# Patient Record
Sex: Female | Born: 1970
Health system: Southern US, Community
[De-identification: ages and names within clinical notes are randomized; demographics above are authoritative.]

## PROBLEM LIST (undated history)

## (undated) DIAGNOSIS — T4145XA Adverse effect of unspecified anesthetic, initial encounter: Secondary | ICD-10-CM

## (undated) DIAGNOSIS — T8859XA Other complications of anesthesia, initial encounter: Secondary | ICD-10-CM

## (undated) DIAGNOSIS — J439 Emphysema, unspecified: Secondary | ICD-10-CM

## (undated) DIAGNOSIS — F319 Bipolar disorder, unspecified: Secondary | ICD-10-CM

## (undated) DIAGNOSIS — G4730 Sleep apnea, unspecified: Secondary | ICD-10-CM

## (undated) DIAGNOSIS — G43909 Migraine, unspecified, not intractable, without status migrainosus: Secondary | ICD-10-CM

## (undated) DIAGNOSIS — F419 Anxiety disorder, unspecified: Secondary | ICD-10-CM

## (undated) DIAGNOSIS — Z973 Presence of spectacles and contact lenses: Secondary | ICD-10-CM

## (undated) DIAGNOSIS — K219 Gastro-esophageal reflux disease without esophagitis: Secondary | ICD-10-CM

## (undated) DIAGNOSIS — Z8489 Family history of other specified conditions: Secondary | ICD-10-CM

## (undated) DIAGNOSIS — J410 Simple chronic bronchitis: Secondary | ICD-10-CM

## (undated) DIAGNOSIS — F329 Major depressive disorder, single episode, unspecified: Secondary | ICD-10-CM

## (undated) DIAGNOSIS — N393 Stress incontinence (female) (male): Secondary | ICD-10-CM

## (undated) DIAGNOSIS — F32A Depression, unspecified: Secondary | ICD-10-CM

## (undated) DIAGNOSIS — T7840XA Allergy, unspecified, initial encounter: Secondary | ICD-10-CM

## (undated) HISTORY — DX: Gastro-esophageal reflux disease without esophagitis: K21.9

## (undated) HISTORY — PX: COLONOSCOPY: SHX174

## (undated) HISTORY — DX: Sleep apnea, unspecified: G47.30

## (undated) HISTORY — PX: ABDOMINAL HYSTERECTOMY: SHX81

## (undated) HISTORY — DX: Emphysema, unspecified: J43.9

## (undated) HISTORY — DX: Migraine, unspecified, not intractable, without status migrainosus: G43.909

## (undated) HISTORY — PX: EYE SURGERY: SHX253

## (undated) HISTORY — DX: Allergy, unspecified, initial encounter: T78.40XA

## (undated) HISTORY — DX: Bipolar disorder, unspecified: F31.9

## (undated) HISTORY — DX: Anxiety disorder, unspecified: F41.9

## (undated) HISTORY — PX: OTHER SURGICAL HISTORY: SHX169

## (undated) HISTORY — PX: CARPAL TUNNEL RELEASE: SHX101

## (undated) HISTORY — PX: GANGLION CYST EXCISION: SHX1691

## (undated) HISTORY — PX: LAPAROSCOPIC TOTAL HYSTERECTOMY: SUR800

## (undated) HISTORY — DX: Major depressive disorder, single episode, unspecified: F32.9

## (undated) HISTORY — DX: Depression, unspecified: F32.A

## (undated) HISTORY — PX: GREAT TOE ARTHRODESIS, JONES PROCEDURE: SUR57

## (undated) HISTORY — PX: UPPER GASTROINTESTINAL ENDOSCOPY: SHX188

---
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
## 1995-01-24 HISTORY — PX: CHOLECYSTECTOMY: SHX55

## 1995-01-24 HISTORY — PX: LAPAROSCOPIC CHOLECYSTECTOMY: SUR755

## 2005-06-05 ENCOUNTER — Encounter: Admission: RE | Admit: 2005-06-05 | Discharge: 2005-06-05 | Payer: Self-pay | Admitting: Internal Medicine

## 2005-06-05 ENCOUNTER — Encounter (INDEPENDENT_AMBULATORY_CARE_PROVIDER_SITE_OTHER): Payer: Self-pay | Admitting: *Deleted

## 2005-11-17 ENCOUNTER — Encounter: Admission: RE | Admit: 2005-11-17 | Discharge: 2005-11-17 | Payer: Self-pay | Admitting: Gastroenterology

## 2007-01-24 HISTORY — PX: CARPAL TUNNEL RELEASE: SHX101

## 2007-03-08 ENCOUNTER — Emergency Department (HOSPITAL_COMMUNITY): Admission: EM | Admit: 2007-03-08 | Discharge: 2007-03-08 | Payer: Self-pay | Admitting: Emergency Medicine

## 2007-09-12 ENCOUNTER — Encounter (INDEPENDENT_AMBULATORY_CARE_PROVIDER_SITE_OTHER): Payer: Self-pay | Admitting: Orthopedic Surgery

## 2007-09-12 ENCOUNTER — Ambulatory Visit (HOSPITAL_BASED_OUTPATIENT_CLINIC_OR_DEPARTMENT_OTHER): Admission: RE | Admit: 2007-09-12 | Discharge: 2007-09-12 | Payer: Self-pay | Admitting: Orthopedic Surgery

## 2010-06-07 NOTE — Op Note (Signed)
NAMETERRESA, Chapman               ACCOUNT NO.:  [id number]   MEDICAL RECORD NO.:  [id number]          PATIENT TYPE:  AMB   LOCATION:  [facility]                         FACILITY:  [facility]   PHYSICIAN:  Deidre Ala, M.D.    DATE OF BIRTH:  Dec 11, 1970   DATE OF PROCEDURE:  09/12/2007  DATE OF DISCHARGE:                               OPERATIVE REPORT   PREOPERATIVE DIAGNOSES:  1. Right carpal tunnel syndrome.  2. Right dorsal wrist ganglion cyst in classic position.   POSTOPERATIVE DIAGNOSES:  1. Right carpal tunnel syndrome.  2. Right dorsal wrist ganglion cyst in classic position.   PROCEDURE:  1. Right carpal tunnel release.  2. Excision dorsal wrist ganglion cyst to capsule.   SURGEON:  Doristine Section, M.D.   ASSISTANT:  Phineas Semen, P.A.   ANESTHESIA:  Local infiltration with 0.5% Marcaine without sedation due  to positive pregnancy test.   ESTIMATED BLOOD LOSS:  Minimal.   TOURNIQUET TIME:  Esmarch on forearm 45 minutes.   PATHOLOGIC FINDINGS AND HISTORY:  Lauren Chapman is a 40 year old female  sent for consultation by Dr. Patsy Lager for management of carpal tunnel  syndrome and a wrist cyst.  The patient has had signs and symptoms of  carpal tunnel syndrome and right dorsal wrist ganglion cyst.  She was  seen first in our office by Dr. Althea Charon on March 29, 2007.  At that time  she had a aspiration of the ganglion cyst and was placed in a splint.  This, however, did not produce long-lasting results with recurrence of  the cyst.  She had a possibility of getting EMG nerve conduction  studies, but because of the situation with her self pay status it was  elected to proceed on clinical grounds alone with positive Phalen's test  and Tinel's right wrist, for carpal tunnel and a tender 1 cm ganglion  cyst dorsally in the classic position at the scapholunate ligament.  At  this time she desired to proceed with surgical intervention.  Preoperatively today her pregnancy test came  back positive so she  elected to proceed but using local anesthetic only.  At surgery we used  0.5% Marcaine plain dorsally and volarly for a total of 30 mL.  We used  an upper forearm tourniquet with Esmarch and did not go to the  tourniquet in the proximal arm.  In any case we found classic findings  of hour glassing of the median nerve which was released well at the  carpal tunnel and we excised the dorsal ganglion cyst which had somewhat  decompressed down to a sessile base at the scapholunate ligament and we  excised the base.  We found blebs in the capsule at the base and at the  dorsal aspect of the ligament and cauterized them as per classic  protocol, excising an 8 x 8 mm area of capsule with the ganglion cyst.  It was sent for specimen.   PROCEDURE:  Adequate anesthesia obtained using local injection with 0.5%  Marcaine plain dorsal and volar.  We first started dorsally and the  ganglion cyst.  A  longitudinal incision was made under loupe  magnification.  Dissection was carried down to the wrist retinaculum  which was incised longitudinally.  We went in the interval between the  second and third dorsal compartment retracting the tendons, found the  ganglion, dissected it down to its sessile base, removed it with a block  of capsule and then cauterized the edges.  Irrigation was carried out  and the wound was closed with 3-0 Vicryl subcu and a running 4-0  Monocryl with Steri-Strips.  We then turned attention to the palmar side  where an incision was made in the basilar palm, thumb flexion crease  longitudinally.  Incision was deepened sharply with a knife and  hemostasis obtained using the Bovie electrocoagulator.  Dissection was  carried down to the palmar fascia.  We then placed a Freer elevator  underneath the palmar fascia to protect the nerve and cut down upon it  with a 64 Beaver blade.  Careful scissor dissection was then carried  out, releasing the nerve distally and  proximally, releasing the  epineurium and all branches were traced distally including the motor  branch.  We then released the nerve up well up into the distal forearm.  That wound was closed with interrupted and running 4-0 nylon.  Bulky  sterile compressive dressing was applied with volar plaster splint in  slight cock-up.  The patient having procedure well was awakened, taken  to recovery room in satisfactory condition to be discharged per  outpatient routine given Vicodin for pain and told to call the office  for appointment for recheck on Monday and to follow-up with her OB/GYN.           ______________________________  V. Charlesetta Shanks, M.D.     VEP/MEDQ  D:  09/12/2007  T:  09/12/2007  Job:  [id number]   cc:   Pearline Cables, MD  Fax: [phone]   Otho Darner, M.D.  Fax: [phone]

## 2010-09-07 ENCOUNTER — Other Ambulatory Visit: Payer: Self-pay | Admitting: Family Medicine

## 2010-09-07 ENCOUNTER — Other Ambulatory Visit: Payer: Self-pay | Admitting: Internal Medicine

## 2010-09-07 DIAGNOSIS — Z1231 Encounter for screening mammogram for malignant neoplasm of breast: Secondary | ICD-10-CM

## 2010-09-19 ENCOUNTER — Ambulatory Visit
Admission: RE | Admit: 2010-09-19 | Discharge: 2010-09-19 | Disposition: A | Discharge status: Home / Self Care | Payer: BC Managed Care – PPO | Source: Ambulatory Visit | Attending: Family Medicine | Admitting: Family Medicine

## 2010-09-19 DIAGNOSIS — Z1231 Encounter for screening mammogram for malignant neoplasm of breast: Secondary | ICD-10-CM

## 2010-09-30 ENCOUNTER — Other Ambulatory Visit: Payer: Self-pay | Admitting: Family Medicine

## 2010-09-30 DIAGNOSIS — R928 Other abnormal and inconclusive findings on diagnostic imaging of breast: Secondary | ICD-10-CM

## 2010-10-07 ENCOUNTER — Ambulatory Visit
Admission: RE | Admit: 2010-10-07 | Discharge: 2010-10-07 | Disposition: A | Discharge status: Home / Self Care | Payer: BC Managed Care – PPO | Source: Ambulatory Visit | Attending: Family Medicine | Admitting: Family Medicine

## 2010-10-07 DIAGNOSIS — R928 Other abnormal and inconclusive findings on diagnostic imaging of breast: Secondary | ICD-10-CM

## 2011-02-14 ENCOUNTER — Ambulatory Visit (INDEPENDENT_AMBULATORY_CARE_PROVIDER_SITE_OTHER): Payer: BC Managed Care – PPO

## 2011-02-14 DIAGNOSIS — R059 Cough, unspecified: Secondary | ICD-10-CM

## 2011-02-14 DIAGNOSIS — J9801 Acute bronchospasm: Secondary | ICD-10-CM

## 2011-02-14 DIAGNOSIS — R05 Cough: Secondary | ICD-10-CM

## 2011-09-23 ENCOUNTER — Ambulatory Visit: Payer: BC Managed Care – PPO

## 2011-10-11 ENCOUNTER — Ambulatory Visit (INDEPENDENT_AMBULATORY_CARE_PROVIDER_SITE_OTHER): Payer: BC Managed Care – PPO | Admitting: Physician Assistant

## 2011-10-11 VITALS — BP 118/76 | HR 78 | Temp 98.0°F | Resp 16 | Ht 63.25 in | Wt 172.6 lb

## 2011-10-11 DIAGNOSIS — Z309 Encounter for contraceptive management, unspecified: Secondary | ICD-10-CM

## 2011-10-11 DIAGNOSIS — J069 Acute upper respiratory infection, unspecified: Secondary | ICD-10-CM

## 2011-10-11 DIAGNOSIS — Z1211 Encounter for screening for malignant neoplasm of colon: Secondary | ICD-10-CM

## 2011-10-11 DIAGNOSIS — IMO0001 Reserved for inherently not codable concepts without codable children: Secondary | ICD-10-CM

## 2011-10-11 DIAGNOSIS — Z1239 Encounter for other screening for malignant neoplasm of breast: Secondary | ICD-10-CM

## 2011-10-11 DIAGNOSIS — Z23 Encounter for immunization: Secondary | ICD-10-CM

## 2011-10-11 DIAGNOSIS — Z113 Encounter for screening for infections with a predominantly sexual mode of transmission: Secondary | ICD-10-CM

## 2011-10-11 DIAGNOSIS — F419 Anxiety disorder, unspecified: Secondary | ICD-10-CM

## 2011-10-11 DIAGNOSIS — R061 Stridor: Secondary | ICD-10-CM

## 2011-10-11 DIAGNOSIS — R635 Abnormal weight gain: Secondary | ICD-10-CM

## 2011-10-11 DIAGNOSIS — Z0000 Encounter for general adult medical examination without abnormal findings: Secondary | ICD-10-CM

## 2011-10-11 DIAGNOSIS — K219 Gastro-esophageal reflux disease without esophagitis: Secondary | ICD-10-CM

## 2011-10-11 DIAGNOSIS — Z124 Encounter for screening for malignant neoplasm of cervix: Secondary | ICD-10-CM

## 2011-10-11 DIAGNOSIS — F319 Bipolar disorder, unspecified: Secondary | ICD-10-CM

## 2011-10-11 LAB — POCT CBC
Granulocyte percent: 66.1 %G (ref 37–80)
MCH, POC: 28.1 pg (ref 27–31.2)
MID (cbc): 0.6 (ref 0–0.9)
MPV: 8.3 fL (ref 0–99.8)
POC Granulocyte: 6.1 (ref 2–6.9)
POC MID %: 6.5 %M (ref 0–12)
Platelet Count, POC: 266 10*3/uL (ref 142–424)
RBC: 4.45 M/uL (ref 4.04–5.48)

## 2011-10-11 LAB — POCT URINALYSIS DIPSTICK
Glucose, UA: NEGATIVE
Spec Grav, UA: 1.01
Urobilinogen, UA: 0.2

## 2011-10-11 LAB — POCT UA - MICROSCOPIC ONLY
Bacteria, U Microscopic: NEGATIVE
Casts, Ur, LPF, POC: NEGATIVE

## 2011-10-11 LAB — POCT URINE PREGNANCY: Preg Test, Ur: NEGATIVE

## 2011-10-11 LAB — IFOBT (OCCULT BLOOD): IFOBT: NEGATIVE

## 2011-10-11 MED ORDER — IPRATROPIUM BROMIDE 0.03 % NA SOLN: 2.0000 | Freq: Two times a day (BID) | NASAL | Status: DC

## 2011-10-11 MED ORDER — MEDROXYPROGESTERONE ACETATE 150 MG/ML IM SUSP
150.0000 mg | Freq: Once | INTRAMUSCULAR | Status: AC
  Administered 2011-10-11: 150 mg via INTRAMUSCULAR

## 2011-10-11 MED ORDER — GUAIFENESIN ER 1200 MG PO TB12: 1.0000 | ORAL_TABLET | Freq: Two times a day (BID) | ORAL | Status: DC | PRN

## 2011-10-11 NOTE — Progress Notes (Signed)
Subjective:    Patient ID: Lauren Chapman, female    DOB: 10-28-1970, 41 y.o.   MRN: [id number]  HPI  Lauren Chapman is a 41 y.o. female who presents for Annual Wellness Exam with gyn exam. 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 Lat mammogram was a few years ago.  She was told she had a benign lump, that needs evaluation every two years.  Last pap was also a few years ago.  Remote history of abnormal pap, with biopsy, likely HPV, normal paps since then. G3 P2 Would like to start DepoProvera for contraception.  Previously on the pill.  Not currently using anything to prevent pregnancy.  Relatively new sexual partner.  Desires screening for STIs.  Allergies  Allergen Reactions  . Codeine Anaphylaxis   Prior to Admission medications   Medication Sig Start Date End Date Taking? Authorizing Provider  benztropine (COGENTIN) 1 MG tablet Take 1 mg by mouth 2 (two) times daily.   Yes Historical Provider, MD  busPIRone (BUSPAR) 15 MG tablet Take 15 mg by mouth 3 (three) times daily.   Yes Historical Provider, MD  gabapentin (NEURONTIN) 300 MG capsule Take 300 mg by mouth 3 (three) times daily.   Yes Historical Provider, MD  lamoTRIgine (LAMICTAL) 150 MG tablet Take 150 mg by mouth daily.   Yes Historical Provider, MD  omeprazole (PRILOSEC) 40 MG capsule Take 40 mg by mouth daily.   Yes Historical Provider, MD  risperiDONE (RISPERDAL) 2 MG tablet Take 2 mg by mouth 2 (two) times daily.   Yes Historical Provider, MD  sertraline (ZOLOFT) 100 MG tablet Take 100 mg by mouth daily.   Yes Historical Provider, MD    Past Medical History  Diagnosis Date  . Anxiety   . Depression   . Bipolar affective psychosis   . GERD (gastroesophageal reflux disease)    Past Surgical History  Procedure Date  . Cholecystectomy   . Carpal tunnel release   . Ganglion cyst excision     RIGHT wrist   History   Social History  . Marital Status: Divorced    Spouse Name: n/a    Number of Children: 2  . Years of Education: 18   Occupational History  . 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 Smoking status: Current Every Day Smoker -- 1.0 packs/day    Types: Cigarettes    Start date: 10/10/1981  . Smokeless tobacco: Never Used  . Alcohol Use: Yes     occasionally  . Drug Use: No  . Sexually Active: Yes -- Female partner(s)    Birth Control/ Protection: None   Other Topics Concern  . None   Social History Narrative  . None   Family History  Problem Relation Age of Onset  . Alcohol abuse Father   . Crohn's disease Sister   . Stroke Brother 77  . Migraines Daughter   . GER disease Daughter   . Migraines Daughter   . GER disease Daughter       Review of Systems  Constitutional: Positive for unexpected weight change (weight gain). Negative for fever, chills, diaphoresis, activity change, appetite change and fatigue.  HENT: Positive for congestion, rhinorrhea, sneezing and postnasal drip. Negative for hearing loss, ear pain, nosebleeds, sore throat, facial swelling, drooling, mouth sores, trouble swallowing, neck pain, neck stiffness, dental problem, voice change, sinus pressure, tinnitus and ear  discharge.   Eyes: Negative.   Respiratory: Positive for cough (non-productive) and chest tightness (with cold symptoms the past few days). Negative for apnea, choking, shortness of breath, wheezing and stridor.   Cardiovascular: Positive for leg swelling ("when I'm on my feet all day, I think it's because I'm fat.").  Gastrointestinal: Positive for rectal pain (after BM; also bright red blood on tissue when wiping; both x 7-12 months; "feels like there's something there, like a lump."). Negative for vomiting.  Genitourinary: Negative.   Musculoskeletal: Positive for arthralgias (x 3 years, when rises from sitting; has to "rest on my joints" briefly.) and gait problem (pain after rising  from sitting). Negative for myalgias, back pain and joint swelling.  Skin: Negative.   Neurological: Positive for headaches (migraine HA). Negative for dizziness, tremors, seizures, syncope, facial asymmetry, speech difficulty, weakness, light-headedness and numbness.  Hematological: Negative.   Psychiatric/Behavioral: Positive for hallucinations (auditory and visual; controlled on current treatment). Negative for behavioral problems, confusion, dysphoric mood, decreased concentration and agitation.     Tuberculosis Risk Questionnaire  1. Were you born outside the [place] in one of the following parts of the world:    [place], [place], [place], [place] or [place]?  No  2. Have you traveled outside the [place] and lived for more than one month in one of the following parts of the world:  [place], [place], [place], [place] or [place]?  Yes [place] as a teenager x 4 years  3. Do you have a compromised immune system such as from any of the following conditions:  HIV/AIDS, organ or bone marrow transplantation, diabetes, immunosuppressive medicines (e.g. Prednisone, Remicaide), leukemia, lymphoma, cancer of the head or neck, gastrectomy or jejunal bypass, end-stage renal disease (on dialysis), or silicosis?  No    4. Have you ever done one of the following:    Used crack cocaine, injected illegal drugs, worked or resided in jail or prison, worked or resided at a homeless shelter, or worked as a Research scientist (physical sciences) in direct contact with patients?  No  5. Have you ever been exposed to anyone with infectious tuberculosis?  No  Tuberculosis Symptom Questionnaire  Do you currently have any of the following symptoms?  1. Unexplained cough lasting more than 3 weeks? No  Unexplained fever lasting more than 3 weeks. No   3. Night Sweats (sweating that leaves the bedclothes and sheets wet)   No  4. Shortness of Breath No  5. Chest Pain No  6. Unintentional weight  loss  No  7. Unexplained fatigue (very tired for no reason) No     Objective:   Physical Exam  Vitals reviewed. Constitutional: She is oriented to person, place, and time. Vital signs are normal. She appears well-developed and well-nourished. She is active and cooperative. No distress.  HENT:  Head: Normocephalic and atraumatic.  Right Ear: Hearing, tympanic membrane, external ear and ear canal normal. No foreign bodies.  Left Ear: Hearing, tympanic membrane, external ear and ear canal normal. No foreign bodies.  Nose: Nose normal.  Mouth/Throat: Uvula is midline, oropharynx is clear and moist and mucous membranes are normal. No oral lesions. Normal dentition. No dental abscesses or uvula swelling. No oropharyngeal exudate.       S/p dental rebuild  Eyes: Conjunctivae normal, EOM and lids are normal. Pupils are equal, round, and reactive to light. Right eye exhibits no discharge. Left eye exhibits no discharge. No scleral icterus.  Fundoscopic exam:  The right eye shows no arteriolar narrowing, no AV nicking, no exudate, no hemorrhage and no papilledema. The right eye shows red reflex.The right eye shows no venous pulsations.      The left eye shows no arteriolar narrowing, no AV nicking, no exudate, no hemorrhage and no papilledema. The left eye shows red reflex.The left eye shows no venous pulsations. Neck: Trachea normal, normal range of motion and full passive range of motion without pain. Neck supple. No spinous process tenderness and no muscular tenderness present. No mass and no thyromegaly present.  Cardiovascular: Normal rate, regular rhythm, normal heart sounds, intact distal pulses and normal pulses.   Pulses:      Radial pulses are 2+ on the right side, and 2+ on the left side.       Dorsalis pedis pulses are 2+ on the right side, and 2+ on the left side.  Pulmonary/Chest: Effort normal and breath sounds normal. She exhibits no tenderness and no retraction. Right breast  exhibits no inverted nipple, no mass, no nipple discharge, no skin change and no tenderness. Left breast exhibits no inverted nipple, no mass, no nipple discharge, no skin change and no tenderness. Breasts are symmetrical.  Abdominal: Soft. Normal appearance and bowel sounds are normal. She exhibits no distension and no mass. There is no hepatosplenomegaly. There is no tenderness. There is no rigidity, no rebound, no guarding, no CVA tenderness, no tenderness at McBurney's point and negative Murphy's sign. No hernia. Hernia confirmed negative in the right inguinal area and confirmed negative in the left inguinal area.  Genitourinary: Rectum normal, vagina normal and uterus normal. Rectal exam shows no external hemorrhoid and no fissure. Internal hemorrhoid: not palpable, but suspected, given her symptoms. No breast swelling, tenderness, discharge or bleeding. Pelvic exam was performed with patient supine. No labial fusion. There is no rash, tenderness, lesion or injury on the right labia. There is no rash, tenderness, lesion or injury on the left labia. Cervix exhibits no motion tenderness, no discharge and no friability. Right adnexum displays no mass, no tenderness and no fullness. Left adnexum displays no mass, no tenderness and no fullness. No erythema, tenderness or bleeding around the vagina. No foreign body around the vagina. No signs of injury around the vagina. No vaginal discharge found.  Musculoskeletal: She exhibits no edema and no tenderness.       Right knee: Normal.       Left knee: Normal.       Right ankle: Normal.       Left ankle: Normal.       Cervical back: Normal.       Thoracic back: Normal.       Lumbar back: Normal.  Lymphadenopathy:       Head (right side): No tonsillar, no preauricular, no posterior auricular and no occipital adenopathy present.       Head (left side): No tonsillar, no preauricular, no posterior auricular and no occipital adenopathy present.    She has no  cervical adenopathy.    She has no axillary adenopathy.       Right: No inguinal and no supraclavicular adenopathy present.       Left: No inguinal and no supraclavicular adenopathy present.  Neurological: She is alert and oriented to person, place, and time. She has normal strength and normal reflexes. No cranial nerve deficit or sensory deficit. She exhibits normal muscle tone. Coordination and gait normal.  Skin: Skin is warm, dry and intact. No rash noted. She  is not diaphoretic. No cyanosis or erythema. Nails show no clubbing.  Psychiatric: She has a normal mood and affect. Her speech is normal and behavior is normal. Cognition and memory are normal. She does not express impulsivity or inappropriate judgment.   Results for orders placed in visit on 10/11/11  POCT CBC      Component Value Range   WBC 9.3  4.6 - 10.2 K/uL   Lymph, poc 2.5  0.6 - 3.4   POC LYMPH PERCENT 27.4  10 - 50 %L   MID (cbc) 0.6  0 - 0.9   POC MID % 6.5  0 - 12 %M   POC Granulocyte 6.1  2 - 6.9   Granulocyte percent 66.1  37 - 80 %G   RBC 4.45  4.04 - 5.48 M/uL   Hemoglobin 12.5  12.2 - 16.2 g/dL   HCT, POC 16.1  09.6 - 47.9 %   MCV 90.8  80 - 97 fL   MCH, POC 28.1  27 - 31.2 pg   MCHC 30.9 (*) 31.8 - 35.4 g/dL   RDW, POC 04.5     Platelet Count, POC 266  142 - 424 K/uL   MPV 8.3  0 - 99.8 fL  POCT UA - MICROSCOPIC ONLY      Component Value Range   WBC, Ur, HPF, POC 0-2     RBC, urine, microscopic 0-1     Bacteria, U Microscopic neg     Mucus, UA neg     Epithelial cells, urine per micros 0-3     Crystals, Ur, HPF, POC neg     Casts, Ur, LPF, POC neg     Yeast, UA neg    POCT URINALYSIS DIPSTICK      Component Value Range   Color, UA yellow     Clarity, UA clear     Glucose, UA neg     Bilirubin, UA neg     Ketones, UA neg     Spec Grav, UA 1.010     Blood, UA small     pH, UA 5.0     Protein, UA neg     Urobilinogen, UA 0.2     Nitrite, UA neg     Leukocytes, UA Trace    POCT URINE  PREGNANCY      Component Value Range   Preg Test, Ur Negative    IFOBT (OCCULT BLOOD)      Component Value Range   IFOBT Negative        Assessment & Plan:   1. Routine general medical examination at a health care facility  POCT CBC, POCT UA - Microscopic Only, POCT urinalysis dipstick, Comprehensive metabolic panel, Lipid panel  2. Contraception  POCT urine pregnancy, medroxyPROGESTERone (DEPO-PROVERA) injection 150 mg; return in 3 months for next dose.  Back-up method advised x 1 week.  3. Screening examination for venereal disease  Hepatitis B surface antibody, Hepatitis B surface antigen, Hepatitis C antibody, RPR, Pap IG, CT/NG NAA, and HPV (high risk); encouraged condom use.  4. Acute upper respiratory infections of unspecified site  ipratropium (ATROVENT) 0.03 % nasal spray, Guaifenesin (MUCINEX MAXIMUM STRENGTH) 1200 MG TB12; supportive care advised.  5. Screening for cervical cancer  Pap IG, CT/NG NAA, and HPV (high risk)  6. Need for influenza vaccination  Flu vaccine greater than or equal to 3yo preservative free IM  7. Weight gain  TSH; exercise, healthy eating.  8. Screening for colon cancer  IFOBT POC (  occult bld, rslt in office)  9. Screening for breast cancer  MM Digital Screening  10. Joint pain      Await labs; consider additional evaluation with ESR, ANA, Rheumatoid profile; Xrays. 11. Probable Internal Hemorrhoid   OTC hemorrhoidal product.  RTC if symptoms persist. 12. Screening for TB     Screening performed.  No need for testing.

## 2011-10-11 NOTE — Patient Instructions (Addendum)
Keeping You Healthy  Get These Tests 1. Blood Pressure- Have your blood pressure checked once a year by your health care provider.  Normal blood pressure is 120/80. 2. Weight- Have your body mass index (BMI) calculated to screen for obesity.  BMI is measure of body fat based on height and weight.  You can also calculate your own BMI at https://www.west-esparza.com/. 3. Cholesterol- Have your cholesterol checked every 5 years starting at age 43 then yearly starting at age 14. 4. Chlamydia, HIV, and other sexually transmitted diseases- Get screened every year until age 67, then within three months of each new sexual provider. 5. Pap Smear- Every 1-3 years; discuss with your health care provider. 6. Mammogram- Every year starting at age 32  Take these medicines  Calcium with Vitamin D-Your body needs 1200 mg of Calcium each day and [phone] IU of Vitamin D daily.  Your body can only absorb 500 mg of Calcium at a time so Calcium must be taken in 2 or 3 divided doses throughout the day.  Multivitamin with folic acid- Once daily if it is possible for you to become pregnant.  Get these Immunizations  Gardasil-Series of three doses; prevents HPV related illness such as genital warts and cervical cancer.  Menactra-Single dose; prevents meningitis.  Tetanus shot- Every 10 years.  Flu shot-Every year.  Take these steps 1. Do not smoke-Your healthcare provider can help you quit.  For tips on how to quit go to www.smokefree.gov or call 1-800 QUITNOW. 2. Be physically active- Exercise 5 days a week for at least 30 minutes.  If you are not already physically active, start slow and gradually work up to 30 minutes of moderate physical activity.  Examples of moderate activity include walking briskly, dancing, swimming, bicycling, etc. 3. Breast Cancer- A self breast exam every month is important for early detection of breast cancer.  For more information and instruction on self breast exams, ask your  healthcare provider or [email or web address].[email or web address]. 4. Eat a healthy diet- Eat a variety of healthy foods such as fruits, vegetables, whole grains, low fat milk, low fat cheeses, yogurt, lean meats, poultry and fish, beans, nuts, tofu, etc.  For more information go to www. Thenutritionsource.org 5. Drink alcohol in moderation- Limit alcohol intake to one drink or less per day. Never drink and drive. 6. Depression- Your emotional health is as important as your physical health.  If you're feeling down or losing interest in things you normally enjoy please talk to your healthcare provider about being screened for depression. 7. Dental visit- Brush and floss your teeth twice daily; visit your dentist twice a year. 8. Eye doctor- Get an eye exam at least every 2 years. 9. Helmet use- Always wear a helmet when riding a bicycle, motorcycle, rollerblading or skateboarding. 10. Safe sex- If you may be exposed to sexually transmitted infections, use a condom. 11. Seat belts- Seat belts can save your live; always wear one. 12. Smoke/Carbon Monoxide detectors- These detectors need to be installed on the appropriate level of your home. Replace batteries at least once a year. 13. Skin cancer- When out in the sun please cover up and use sunscreen 15 SPF or higher. 14. Violence- If anyone is threatening or hurting you, please tell your healthcare provider.  For your cold, Get plenty of rest and drink at least 64 ounces of water daily.  If your symptoms worsen or persist, please return for additional evaluation. If the pain and bleeding from your rectum persist, even with  the use of an OTC hemorrhoidal cream, please return.

## 2011-10-12 DIAGNOSIS — F319 Bipolar disorder, unspecified: Secondary | ICD-10-CM | POA: Insufficient documentation

## 2011-10-12 DIAGNOSIS — F329 Major depressive disorder, single episode, unspecified: Secondary | ICD-10-CM | POA: Insufficient documentation

## 2011-10-12 DIAGNOSIS — F419 Anxiety disorder, unspecified: Secondary | ICD-10-CM | POA: Insufficient documentation

## 2011-10-12 DIAGNOSIS — K219 Gastro-esophageal reflux disease without esophagitis: Secondary | ICD-10-CM | POA: Insufficient documentation

## 2011-10-12 LAB — COMPREHENSIVE METABOLIC PANEL
ALT: 14 U/L (ref 0–35)
AST: 15 U/L (ref 0–37)
Alkaline Phosphatase: 68 U/L (ref 39–117)
Calcium: 9.5 mg/dL (ref 8.4–10.5)
Chloride: 100 mEq/L (ref 96–112)
Creat: 1.03 mg/dL (ref 0.50–1.10)
Total Bilirubin: 0.3 mg/dL (ref 0.3–1.2)

## 2011-10-12 LAB — LIPID PANEL
LDL Cholesterol: 103 mg/dL — ABNORMAL HIGH (ref 0–99)
Total CHOL/HDL Ratio: 3.6 Ratio
VLDL: 26 mg/dL (ref 0–40)

## 2011-10-13 LAB — HEPATITIS B SURFACE ANTIGEN: Hepatitis B Surface Ag: NEGATIVE

## 2011-10-13 LAB — HEPATITIS B SURFACE ANTIBODY, QUANTITATIVE: Hepatitis B-Post: 0.8 m[IU]/mL

## 2011-10-16 LAB — PAP IG, CT-NG NAA, HPV HIGH-RISK
Chlamydia Probe Amp: NEGATIVE
GC Probe Amp: NEGATIVE
HPV DNA High Risk: NOT DETECTED

## 2011-10-17 ENCOUNTER — Encounter: Payer: Self-pay | Admitting: Physician Assistant

## 2011-11-08 LAB — HM MAMMOGRAPHY: HM Mammogram: NEGATIVE

## 2011-11-10 ENCOUNTER — Encounter: Payer: Self-pay | Admitting: Physician Assistant

## 2012-01-08 ENCOUNTER — Ambulatory Visit: Payer: Self-pay | Admitting: Physician Assistant

## 2012-01-08 DIAGNOSIS — Z309 Encounter for contraceptive management, unspecified: Secondary | ICD-10-CM

## 2012-01-08 MED ORDER — MEDROXYPROGESTERONE ACETATE 150 MG/ML IM SUSP
150.0000 mg | Freq: Once | INTRAMUSCULAR | Status: AC
  Administered 2012-01-08: 150 mg via INTRAMUSCULAR

## 2012-01-08 NOTE — Progress Notes (Signed)
   [address] [place] [address]   Phone [phone]   Pt here for her Depo-provera injection.  She is within her window.  Not having any problems.

## 2012-03-23 DIAGNOSIS — Z0271 Encounter for disability determination: Secondary | ICD-10-CM

## 2012-05-28 ENCOUNTER — Other Ambulatory Visit: Payer: Self-pay | Admitting: Internal Medicine

## 2013-06-30 LAB — HM MAMMOGRAPHY

## 2013-07-07 ENCOUNTER — Encounter: Payer: Self-pay | Admitting: Physician Assistant

## 2014-07-25 DIAGNOSIS — G4489 Other headache syndrome: Secondary | ICD-10-CM | POA: Diagnosis not present

## 2014-07-25 DIAGNOSIS — R112 Nausea with vomiting, unspecified: Secondary | ICD-10-CM | POA: Diagnosis not present

## 2014-08-10 DIAGNOSIS — Z13220 Encounter for screening for lipoid disorders: Secondary | ICD-10-CM | POA: Diagnosis not present

## 2014-09-02 ENCOUNTER — Encounter: Payer: Self-pay | Admitting: Obstetrics & Gynecology

## 2014-09-02 ENCOUNTER — Ambulatory Visit (INDEPENDENT_AMBULATORY_CARE_PROVIDER_SITE_OTHER): Payer: Medicare Other | Admitting: Obstetrics & Gynecology

## 2014-09-02 VITALS — BP 90/64 | HR 83 | Resp 18 | Ht 63.0 in | Wt 177.0 lb

## 2014-09-02 DIAGNOSIS — Z0000 Encounter for general adult medical examination without abnormal findings: Secondary | ICD-10-CM

## 2014-09-02 DIAGNOSIS — Z30011 Encounter for initial prescription of contraceptive pills: Secondary | ICD-10-CM

## 2014-09-02 DIAGNOSIS — Z3009 Encounter for other general counseling and advice on contraception: Secondary | ICD-10-CM | POA: Insufficient documentation

## 2014-09-02 MED ORDER — NORETHIN ACE-ETH ESTRAD-FE 1-20 MG-MCG(24) PO TABS: 1.0000 | ORAL_TABLET | Freq: Every day | ORAL | Status: DC

## 2014-09-02 NOTE — Patient Instructions (Addendum)
Intrauterine Device Information An intrauterine device (IUD) is inserted into your uterus to prevent pregnancy. There are two types of IUDs available:   Copper IUD--This type of IUD is wrapped in copper wire and is placed inside the uterus. Copper makes the uterus and fallopian tubes produce a fluid that kills sperm. The copper IUD can stay in place for 10 years.  Hormone IUD--This type of IUD contains the hormone progestin (synthetic progesterone). The hormone thickens the cervical mucus and prevents sperm from entering the uterus. It also thins the uterine lining to prevent implantation of a fertilized egg. The hormone can weaken or kill the sperm that get into the uterus. One type of hormone IUD can stay in place for 5 years, and another type can stay in place for 3 years. Your health care provider will make sure you are a good candidate for a contraceptive IUD. Discuss with your health care provider the possible side effects.  ADVANTAGES OF AN INTRAUTERINE DEVICE  IUDs are highly effective, reversible, long acting, and low maintenance.   There are no estrogen-related side effects.   An IUD can be used when breastfeeding.   IUDs are not associated with weight gain.   The copper IUD works immediately after insertion.   The hormone IUD works right away if inserted within 7 days of your period starting. You will need to use a backup method of birth control for 7 days if the hormone IUD is inserted at any other time in your cycle.  The copper IUD does not interfere with your female hormones.   The hormone IUD can make heavy menstrual periods lighter and decrease cramping.   The hormone IUD can be used for 3 or 5 years.   The copper IUD can be used for 10 years. DISADVANTAGES OF AN INTRAUTERINE DEVICE  The hormone IUD can be associated with irregular bleeding patterns.   The copper IUD can make your menstrual flow heavier and more painful.   You may experience cramping and  vaginal bleeding after insertion.  Document Released: 12/14/2003 Document Revised: 09/11/2012 Document Reviewed: 06/30/2012 [facility] Patient Information 2015 [place], [place]. This information is not intended to replace advice given to you by your health care provider. Make sure you discuss any questions you have with your health care provider.   Contraception Choices Contraception (birth control) is the use of any methods or devices to prevent pregnancy. Below are some methods to help avoid pregnancy. HORMONAL METHODS   Contraceptive implant. This is a thin, plastic tube containing progesterone hormone. It does not contain estrogen hormone. Your health care provider inserts the tube in the inner part of the upper arm. The tube can remain in place for up to 3 years. After 3 years, the implant must be removed. The implant prevents the ovaries from releasing an egg (ovulation), thickens the cervical mucus to prevent sperm from entering the uterus, and thins the lining of the inside of the uterus.  Progesterone-only injections. These injections are given every 3 months by your health care provider to prevent pregnancy. This synthetic progesterone hormone stops the ovaries from releasing eggs. It also thickens cervical mucus and changes the uterine lining. This makes it harder for sperm to survive in the uterus.  Birth control pills. These pills contain estrogen and progesterone hormone. They work by preventing the ovaries from releasing eggs (ovulation). They also cause the cervical mucus to thicken, preventing the sperm from entering the uterus. Birth control pills are prescribed by a health care  provider.Birth control pills can also be used to treat heavy periods.  Minipill. This type of birth control pill contains only the progesterone hormone. They are taken every day of each month and must be prescribed by your health care provider.  Birth control patch. The patch contains hormones similar to  those in birth control pills. It must be changed once a week and is prescribed by a health care provider.  Vaginal ring. The ring contains hormones similar to those in birth control pills. It is left in the vagina for 3 weeks, removed for 1 week, and then a new one is put back in place. The patient must be comfortable inserting and removing the ring from the vagina.A health care provider's prescription is necessary.  Emergency contraception. Emergency contraceptives prevent pregnancy after unprotected sexual intercourse. This pill can be taken right after sex or up to 5 days after unprotected sex. It is most effective the sooner you take the pills after having sexual intercourse. Most emergency contraceptive pills are available without a prescription. Check with your pharmacist. Do not use emergency contraception as your only form of birth control. BARRIER METHODS   Female condom. This is a thin sheath (latex or rubber) that is worn over the penis during sexual intercourse. It can be used with spermicide to increase effectiveness.  Female condom. This is a soft, loose-fitting sheath that is put into the vagina before sexual intercourse.  Diaphragm. This is a soft, latex, dome-shaped barrier that must be fitted by a health care provider. It is inserted into the vagina, along with a spermicidal jelly. It is inserted before intercourse. The diaphragm should be left in the vagina for 6 to 8 hours after intercourse.  Cervical cap. This is a round, soft, latex or plastic cup that fits over the cervix and must be fitted by a health care provider. The cap can be left in place for up to 48 hours after intercourse.  Sponge. This is a soft, circular piece of polyurethane foam. The sponge has spermicide in it. It is inserted into the vagina after wetting it and before sexual intercourse.  Spermicides. These are chemicals that kill or block sperm from entering the cervix and uterus. They come in the form of  creams, jellies, suppositories, foam, or tablets. They do not require a prescription. They are inserted into the vagina with an applicator before having sexual intercourse. The process must be repeated every time you have sexual intercourse. INTRAUTERINE CONTRACEPTION  Intrauterine device (IUD). This is a T-shaped device that is put in a woman's uterus during a menstrual period to prevent pregnancy. There are 2 types:  Copper IUD. This type of IUD is wrapped in copper wire and is placed inside the uterus. Copper makes the uterus and fallopian tubes produce a fluid that kills sperm. It can stay in place for 10 years.  Hormone IUD. This type of IUD contains the hormone progestin (synthetic progesterone). The hormone thickens the cervical mucus and prevents sperm from entering the uterus, and it also thins the uterine lining to prevent implantation of a fertilized egg. The hormone can weaken or kill the sperm that get into the uterus. It can stay in place for 3-5 years, depending on which type of IUD is used. PERMANENT METHODS OF CONTRACEPTION  Female tubal ligation. This is when the woman's fallopian tubes are surgically sealed, tied, or blocked to prevent the egg from traveling to the uterus.  Hysteroscopic sterilization. This involves placing a small coil or insert  into each fallopian tube. Your doctor uses a technique called hysteroscopy to do the procedure. The device causes scar tissue to form. This results in permanent blockage of the fallopian tubes, so the sperm cannot fertilize the egg. It takes about 3 months after the procedure for the tubes to become blocked. You must use another form of birth control for these 3 months.  Female sterilization. This is when the female has the tubes that carry sperm tied off (vasectomy).This blocks sperm from entering the vagina during sexual intercourse. After the procedure, the man can still ejaculate fluid (semen). NATURAL PLANNING METHODS  Natural family  planning. This is not having sexual intercourse or using a barrier method (condom, diaphragm, cervical cap) on days the woman could become pregnant.  Calendar method. This is keeping track of the length of each menstrual cycle and identifying when you are fertile.  Ovulation method. This is avoiding sexual intercourse during ovulation.  Symptothermal method. This is avoiding sexual intercourse during ovulation, using a thermometer and ovulation symptoms.  Post-ovulation method. This is timing sexual intercourse after you have ovulated. Regardless of which type or method of contraception you choose, it is important that you use condoms to protect against the transmission of sexually transmitted infections (STIs). Talk with your health care provider about which form of contraception is most appropriate for you. Document Released: 01/09/2005 Document Revised: 01/14/2013 Document Reviewed: 07/04/2012 [facility] Patient Information 2015 [place], [place]. This information is not intended to replace advice given to you by your health care provider. Make sure you discuss any questions you have with your health care provider.   Laparoscopic Tubal Ligation Laparoscopic tubal ligation is a procedure that closes the fallopian tubes at a time other than right after childbirth. By closing the fallopian tubes, the eggs that are released from the ovaries cannot enter the uterus and sperm cannot reach the egg. Tubal ligation is also known as getting your "tubes tied." Tubal ligation is done so you will not be able to get pregnant or have a baby.  Although this procedure may be reversed, it should be considered permanent and irreversible. If you want to have future pregnancies, you should not have this procedure.  LET YOUR CAREGIVER KNOW ABOUT:  Allergies to food or medicine.  Medicines taken, including vitamins, herbs, eyedrops, over-the-counter medicines, and creams.  Use of steroids (by mouth or  creams).  Previous problems with numbing medicines.  History of bleeding problems or blood clots.  Any recent colds or infections.  Previous surgery.  Other health problems, including diabetes and kidney problems.  Possibility of pregnancy, if this applies.  Any past pregnancies. RISKS AND COMPLICATIONS   Infection.  Bleeding.  Injury to surrounding organs.  Anesthetic side effects.  Failure of the procedure.  Ectopic pregnancy.  Future regret about having the procedure done. BEFORE THE PROCEDURE  Do not take aspirin or blood thinners a week before the procedure or as directed. This can cause bleeding.  Do not eat or drink anything 6 to 8 hours before the procedure. PROCEDURE   You may be given a medicine to help you relax (sedative) before the procedure. You will be given a medicine to make you sleep (general anesthetic) during the procedure.  A tube will be put down your throat to help your breath while under general anesthesia.  Two small cuts (incisions) are made in the lower abdominal area and near the belly button.  Your abdominal area will be inflated with a safe gas (carbon dioxide).  This helps give the surgeon room to operate, visualize, and helps the surgeon avoid other organs.  A thin, lighted tube (laparoscope) with a camera attached is inserted into your abdomen through one of the incisions near the belly button. Other small instruments are also inserted through the other abdominal incision.  The fallopian tubes are located and are either blocked with a ring, clip, or are burned (cauterized).  After the fallopian tubes are blocked, the gas is released from the abdomen.  The incisions will be closed with stitches (sutures), and a bandage may be placed over the incisions. AFTER THE PROCEDURE   You will rest in a recovery room for 1--4 hours until you are stable and doing well.  You will also have some mild abdominal discomfort for 3--7 days. You will  be given pain medicine to ease any discomfort.  As long as there are no problems, you may be allowed to go home. Someone will need to drive you home and be with you for at least 24 hours once home.  You may have some mild discomfort in the throat. This is from the tube placed in your throat while you were sleeping.  You may experience discomfort in the shoulder area from some trapped air between the liver and diaphragm. This sensation is normal and will slowly go away on its own. Document Released: 04/17/2000 Document Revised: 07/11/2011 Document Reviewed: 04/22/2011 [facility] Patient Information 2015 [place], [place]. This information is not intended to replace advice given to you by your health care provider. Make sure you discuss any questions you have with your health care provider.   Sterilization Information, Female Female sterilization is a procedure to permanently prevent pregnancy. There are different ways to perform sterilization, but all either block or close the fallopian tubes so that your eggs cannot reach your uterus. If your egg cannot reach your uterus, sperm cannot fertilize the egg, and you cannot get pregnant.  Sterilization is performed by a surgical procedure. Sometimes these procedures are performed in a hospital while a patient is asleep. Sometimes they can be done in a clinic setting with the patient awake. The fallopian tubes can be surgically cut, tied, or sealed through a procedure called tubal ligation. The fallopian tubes can also be closed with clips or rings. Sterilization can also be done by placing a tiny coil into each fallopian tube, which causes scar tissue to grow inside the tube. The scar tissue then blocks the tubes.  Discuss sterilization with your caregiver to answer any concerns you or your partner may have. You may want to ask what type of sterilization your caregiver performs. Some caregivers may not perform all the various options. Sterilization is permanent  and should only be done if you are sure you do not want children or do not want any more children. Having a sterilization reversed may not be successful.  STERILIZATION PROCEDURES  Laparoscopic sterilization. This is a surgical method performed at a time other than right after childbirth. Two incisions are made in the lower abdomen. A thin, lighted tube (laparoscope) is inserted into one of the incisions and is used to perform the procedure. The fallopian tubes are closed with a ring or a clip. An instrument that uses heat could be used to seal the tubes closed (electrocautery).   Mini-laparotomy. This is a surgical method done 1 or 2 days after giving birth. Typically, a small incision is made just below the belly button (umbilicus) and the fallopian tubes are exposed. The tubes  can then be sealed, tied, or cut.   Hysteroscopic sterilization. This is performed at a time other than right after childbirth. A tiny, spring-like coil is inserted through the cervix and uterus and placed into the fallopian tubes. The coil causes scaring and blocks the tubes. Other forms of contraception should be used for 3 months after the procedure to allow the scar tissue to form completely. Additionally, it is required hysterosalpingography be done 3 months later to ensure that the procedure was successful. Hysterosalpingography is a procedure that uses X-rays to look at your uterus and fallopian tubes after a material to make them show up better has been inserted. IS STERILIZATION SAFE? Sterilization is considered safe with very rare complications. Risks depend on the type of procedure you have. As with any surgical procedure, there are risks. Some risks of sterilization by any means include:   Bleeding.  Infection.  Reaction to anesthesia medicine.  Injury to surrounding organs. Risks specific to having hysteroscopic coils placed include:  The coils may not be placed correctly the first time.   The coils  may move out of place.   The tubes may not get completely blocked after 3 months.   Injury to surrounding organs when placing the coil.  HOW EFFECTIVE IS FEMALE STERILIZATION? Sterilization is nearly 100% effective, but it can fail. Depending on the type of sterilization, the rate of failure can be as high as 3%. After hysteroscopic sterilization with placement of fallopian tube coils, you will need back-up birth control for 3 months after the procedure. Sterilization is effective for a lifetime.  BENEFITS OF STERILIZATION  It does not affect your hormones, and therefore will not affect your menstrual periods, sexual desire, or performance.   It is effective for a lifetime.   It is safe.   You do not need to worry about getting pregnant. Keep in mind that if you had the hysteroscopic placement procedure, you must wait 3 months after the procedure (or until your caregiver confirms) before pregnancy is not considered possible.   There are no side effects unlike other types of birth control (contraception).  DRAWBACKS OF STERILIZATION  You must be sure you do not want children or any more children. The procedure is permanent.   It does not provide protection against sexually transmitted infections (STIs).   The tubes can grow back together. If this happens, there is a risk of pregnancy. There is also an increased risk (50%) of pregnancy being an ectopic pregnancy. This is a pregnancy that happens outside of the uterus. Document Released: 06/28/2007 Document Revised: 01/14/2013 Document Reviewed: 04/27/2011 [facility] Patient Information 2015 [place], [place]. This information is not intended to replace advice given to you by your health care provider. Make sure you discuss any questions you have with your health care provider.

## 2014-09-02 NOTE — Progress Notes (Signed)
CLINIC ENCOUNTER NOTE  History:  44 y.o. [id number] here today for tubal ligation consult; referred from [facility].  Last pregnancy was about 5 years ago and ended as a SAB needing D&C.  Has been using condoms but feels she needs something else.  Doe snot feel she wants sterilization anymore, wants to discuss other options.  She denies any abnormal vaginal discharge, bleeding, pelvic pain or other concerns.   Past Medical History  Diagnosis Date  . Anxiety   . Depression   . Bipolar affective psychosis   . GERD (gastroesophageal reflux disease)   . Migraine     Past Surgical History  Procedure Laterality Date  . Cholecystectomy    . Carpal tunnel release    . Ganglion cyst excision      RIGHT wrist   OB History  Gravida Para Term Preterm AB SAB TAB Ectopic Multiple Living  3 2 2  0 1 1 0 0 0 2    # Outcome Date GA Lbr Len/2nd Weight Sex Delivery Anes PTL Lv  3 Term           2 Term           1 SAB               The following portions of the patient's history were reviewed and updated as appropriate: allergies, current medications, past family history, past medical history, past social history, past surgical history and problem list.   Health Maintenance:  Normal pap recently.  Normal mammogram in 06/2013.  Review of Systems:  Pertinent items are noted in HPI. Comprehensive review of systems was otherwise negative.  Objective:  Physical Exam BP 90/64 mmHg  Pulse 83  Resp 18  Ht 5\' 3"  (1.6 m)  Wt 177 lb (80.287 kg)  BMI 31.36 kg/m2  LMP 06/07/2014 (Exact Date) CONSTITUTIONAL: Well-developed, well-nourished female in no acute distress.  HENT:  Normocephalic, atraumatic. External right and left ear normal. Oropharynx is clear and moist EYES: Conjunctivae and EOM are normal. Pupils are equal, round, and reactive to light. No scleral icterus.  NECK: Normal range of motion, supple, no masses SKIN: Skin is warm and dry. No rash noted. Not diaphoretic. No erythema. No  pallor. [place]: Alert and oriented to person, place, and time. Normal reflexes, muscle tone coordination. No cranial nerve deficit noted. PSYCHIATRIC: Normal mood and affect. Normal behavior. Normal judgment and thought content. CARDIOVASCULAR: Normal heart rate noted RESPIRATORY: Effort and breath sounds normal, no problems with respiration noted ABDOMEN: Soft, no distention noted.   PELVIC: Deferred MUSCULOSKELETAL: Normal range of motion. No edema noted.   Assessment & Plan:  Reviewed all forms of birth control options available including abstinence; over the counter/barrier methods; hormonal contraceptive medication including pill, patch, ring, injection,contraceptive implant; hormonal and nonhormonal IUDs; permanent sterilization options including vasectomy and the various tubal sterilization modalities. Risks and benefits reviewed; LARCs emphasized as a bridge to menopause.  Questions were answered.  Information was given to patient to review.  Patient wants OCPs for now; will think further about LARCs or sterilization. Loestrin 24 Fe prescribed. Will return in 2 months for OCP and BP check or earlier if needed. Routine preventative health maintenance measures emphasized. Please refer to After Visit Summary for other counseling recommendations.    Total face-to-face time with patient: 20 minutes. Over 50% of encounter was spent on counseling and coordination of care.   Verita Schneiders, MD, [place] Attending [place] [facility]

## 2014-09-09 DIAGNOSIS — F250 Schizoaffective disorder, bipolar type: Secondary | ICD-10-CM | POA: Diagnosis not present

## 2014-09-21 ENCOUNTER — Encounter: Payer: Self-pay | Admitting: *Deleted

## 2014-10-09 DIAGNOSIS — Z1231 Encounter for screening mammogram for malignant neoplasm of breast: Secondary | ICD-10-CM | POA: Diagnosis not present

## 2014-10-09 LAB — HM MAMMOGRAPHY

## 2014-10-19 ENCOUNTER — Encounter: Payer: Self-pay | Admitting: *Deleted

## 2014-10-19 ENCOUNTER — Encounter: Payer: Self-pay | Admitting: Neurology

## 2014-10-19 ENCOUNTER — Ambulatory Visit (INDEPENDENT_AMBULATORY_CARE_PROVIDER_SITE_OTHER): Payer: Medicare Other | Admitting: Neurology

## 2014-10-19 VITALS — BP 101/70 | HR 69 | Ht 63.0 in | Wt 180.0 lb

## 2014-10-19 DIAGNOSIS — G43109 Migraine with aura, not intractable, without status migrainosus: Secondary | ICD-10-CM | POA: Diagnosis not present

## 2014-10-19 MED ORDER — PROPRANOLOL HCL 60 MG PO TABS: 60.0000 mg | ORAL_TABLET | Freq: Two times a day (BID) | ORAL | Status: DC

## 2014-10-19 MED ORDER — RIZATRIPTAN BENZOATE 10 MG PO TBDP: 10.0000 mg | ORAL_TABLET | ORAL | Status: DC | PRN

## 2014-10-19 NOTE — Progress Notes (Signed)
PATIENT: Lauren Chapman DOB: [birth year]  Chief Complaint  Patient presents with  . Migraine    She is getting approximately two migraines weekly. She has been using OTC NSAIDS and sumatriptan for treatment but without much relief.  She has never been on a prophylactic medication.     HISTORICAL  Lauren Chapman is a 44 year old right-handed female, seen in refer by her primary care Bettey Costa, NP for evaluation of migraine in October 19 2014  I reviewed and summarized her office visit, reviewed laboratory in July 2016, mild elevated triglycerides 208,  She has a history of bipolar affective disorder, is on polypharmacy treatment, this including lamotrigine, Zoloft, Brintellix, Valium, Cogentin, gabapentin.  She started to have migraine since 44 years old, her typical migraine are lateralized severe pounding headache was associated light noise sensitivity, lasting 1-2 days, she often has alternating of sound, visual disturbance before the onset of migraine, and during severe headaches. over the years, she has had at least twice migraine each week, she has been taking frequent goody powders, which does not work anymore, now she take over-the-counter ibuprofen and Aleve, up to 10 tablets each day to try to get rid of headache with suboptimal response. She was giving a prescription of Imitrex upon why ER presentation for her migraines, which does not work well, she reported loose control of her bowel movements after taking Imitrex.   She was not able to identify trigger for her migraine, she does couple days each week because of her severe headaches, she has never tried any preventive medications in the past  REVIEW OF SYSTEMS: Full 14 system review of systems performed and notable only for headaches  ALLERGIES: Allergies  Allergen Reactions  . Codeine Anaphylaxis    HOME MEDICATIONS: Current Outpatient Prescriptions  Medication Sig Dispense Refill  . benztropine (COGENTIN) 1  MG tablet Take 1 mg by mouth 2 (two) times daily.    . diazepam (VALIUM) 5 MG tablet Take 5 mg by mouth 1 day or 1 dose.    . esomeprazole (NEXIUM) 20 MG capsule Take 20 mg by mouth daily at 12 noon.    . gabapentin (NEURONTIN) 300 MG capsule Take 300 mg by mouth 3 (three) times daily.    Marland Kitchen ibuprofen (ADVIL,MOTRIN) 800 MG tablet Take 800 mg by mouth every 8 (eight) hours as needed.    . lamoTRIgine (LAMICTAL) 150 MG tablet Take 150 mg by mouth daily.    . Naproxen Sodium (ALEVE PO) Take by mouth as needed.    . Norethindrone Acetate-Ethinyl Estrad-FE (LOESTRIN 24 FE) 1-20 MG-MCG(24) tablet Take 1 tablet by mouth daily. 1 Package 11  . pantoprazole (PROTONIX) 40 MG tablet TAKE 1 TABLET EVERY MORNING 30 tablet 0  . perphenazine (TRILAFON) 16 MG tablet Take 32 mg by mouth at bedtime.    . risperiDONE (RISPERDAL) 2 MG tablet Take 2 mg by mouth 2 (two) times daily.    . sertraline (ZOLOFT) 100 MG tablet Take 100 mg by mouth daily.    . SUMAtriptan (IMITREX) 20 MG/ACT nasal spray Place 40 mg into the nose every 2 (two) hours as needed for migraine or headache. May repeat in 2 hours if headache persists or recurs.    . Vortioxetine HBr (BRINTELLIX) 20 MG TABS Take by mouth daily.     No current facility-administered medications for this visit.    PAST MEDICAL HISTORY: Past Medical History  Diagnosis Date  . Anxiety   . Depression   .  Bipolar affective psychosis   . GERD (gastroesophageal reflux disease)   . Migraine     PAST SURGICAL HISTORY: Past Surgical History  Procedure Laterality Date  . Cholecystectomy    . Carpal tunnel release    . Ganglion cyst excision      RIGHT wrist    FAMILY HISTORY: Family History  Problem Relation Age of Onset  . Alcohol abuse Father   . Crohn's disease Sister   . Stroke Brother 72  . Migraines Daughter   . GER disease Daughter   . Migraines Daughter   . GER disease Daughter   . Depression Mother     SOCIAL HISTORY:  Social History    Social History  . Marital Status: Divorced    Spouse Name: n/a  . Number of Children: 2  . Years of Education: 18   Occupational History  . Disabled     Formerly a Pharmacist, hospital.   Social History Main Topics  . Smoking status: Current Every Day Smoker -- 0.25 packs/day    Types: Cigarettes    Start date: 10/10/1981  . Smokeless tobacco: Never Used  . Alcohol Use: 0.0 oz/week    0 Standard drinks or equivalent per week     Comment: occasionally  . Drug Use: No  . Sexual Activity:    Partners: Male    Birth Control/ Protection: None   Other Topics Concern  . Not on file   Social History Narrative   Lives at home with her daughter.   Right-handed.   2-4 cups caffeine daily.     PHYSICAL EXAM   Filed Vitals:   10/19/14 1022  BP: 101/70  Pulse: 69  Height: 5\' 3"  (1.6 m)  Weight: 180 lb (81.647 kg)    Not recorded      Body mass index is 31.89 kg/(m^2).  PHYSICAL EXAMNIATION:  Gen: NAD, conversant, well nourised, obese, well groomed                     Cardiovascular: Regular rate rhythm, no peripheral edema, warm, nontender. Eyes: Conjunctivae clear without exudates or hemorrhage Neck: Supple, no carotid bruise. Pulmonary: Clear to auscultation bilaterally   NEUROLOGICAL EXAM:  MENTAL STATUS: Speech:    Speech is normal; fluent and spontaneous with normal comprehension.  Cognition:     Orientation to time, place and person     Normal recent and remote memory     Normal Attention span and concentration     Normal Language, naming, repeating,spontaneous speech     Fund of knowledge   CRANIAL NERVES: CN II: Visual fields are full to confrontation. Fundoscopic exam is normal with sharp discs and no vascular changes. Pupils are round equal and briskly reactive to light. CN III, IV, VI: extraocular movement are normal. No ptosis. CN V: Facial sensation is intact to pinprick in all 3 divisions bilaterally. Corneal responses are intact.  CN VII: Face is  symmetric with normal eye closure and smile. CN VIII: Hearing is normal to rubbing fingers CN IX, X: Palate elevates symmetrically. Phonation is normal. CN XI: Head turning and shoulder shrug are intact CN XII: Tongue is midline with normal movements and no atrophy.  MOTOR: There is no pronator drift of out-stretched arms. Muscle bulk and tone are normal. Muscle strength is normal.  REFLEXES: Reflexes are 2+ and symmetric at the biceps, triceps, knees, and ankles. Plantar responses are flexor.  SENSORY: Intact to light touch, pinprick, position sense, and vibration sense are  intact in fingers and toes.  COORDINATION: Rapid alternating movements and fine finger movements are intact. There is no dysmetria on finger-to-nose and heel-knee-shin.    GAIT/STANCE: Posture is normal. Gait is steady with normal steps, base, arm swing, and turning. Heel and toe walking are normal. Tandem gait is normal.  Romberg is absent.   DIAGNOSTIC DATA (LABS, IMAGING, TESTING) - I reviewed patient records, labs, notes, testing and imaging myself where available.   ASSESSMENT AND PLAN  Lauren Chapman is a 44 y.o. female   Chronic migraine with aura  She is on polypharmacy already for her bipolar disorder  started Inderal 60 mg twice a day  Maxalt dissolvable 10 mg as the needed Bipolar disorder   On polypharmacy treatment, including Valium, Zoloft, lamotrigine, Brintellix, Cogentin   Marcial Pacas, M.D. Ph.D.  [facility] [address], [place], [place] [address] Ph: [phone] Fax: [phone]  CC: Bettey Costa, NP

## 2014-10-19 NOTE — Patient Instructions (Signed)
Magnesium oxide 400 mg twice a day Riboflavin  100 mg twice a day 

## 2014-11-06 DIAGNOSIS — F250 Schizoaffective disorder, bipolar type: Secondary | ICD-10-CM | POA: Diagnosis not present

## 2014-11-13 ENCOUNTER — Encounter: Payer: Self-pay | Admitting: Family Medicine

## 2014-12-09 DIAGNOSIS — F250 Schizoaffective disorder, bipolar type: Secondary | ICD-10-CM | POA: Diagnosis not present

## 2014-12-11 DIAGNOSIS — F250 Schizoaffective disorder, bipolar type: Secondary | ICD-10-CM | POA: Diagnosis not present

## 2014-12-11 DIAGNOSIS — F39 Unspecified mood [affective] disorder: Secondary | ICD-10-CM | POA: Diagnosis not present

## 2014-12-22 ENCOUNTER — Ambulatory Visit (INDEPENDENT_AMBULATORY_CARE_PROVIDER_SITE_OTHER): Payer: Medicare Other | Admitting: Neurology

## 2014-12-22 ENCOUNTER — Encounter: Payer: Self-pay | Admitting: Neurology

## 2014-12-22 VITALS — BP 100/67 | HR 60 | Ht 63.0 in | Wt 181.0 lb

## 2014-12-22 DIAGNOSIS — G43709 Chronic migraine without aura, not intractable, without status migrainosus: Secondary | ICD-10-CM | POA: Insufficient documentation

## 2014-12-22 DIAGNOSIS — IMO0002 Reserved for concepts with insufficient information to code with codable children: Secondary | ICD-10-CM

## 2014-12-22 DIAGNOSIS — Z0000 Encounter for general adult medical examination without abnormal findings: Secondary | ICD-10-CM

## 2014-12-22 DIAGNOSIS — F3170 Bipolar disorder, currently in remission, most recent episode unspecified: Secondary | ICD-10-CM

## 2014-12-22 MED ORDER — SUMATRIPTAN SUCCINATE 6 MG/0.5ML ~~LOC~~ SOLN: 6.0000 mg | SUBCUTANEOUS | Status: DC | PRN

## 2014-12-22 MED ORDER — PROMETHAZINE HCL 25 MG PO TABS: 25.0000 mg | ORAL_TABLET | Freq: Four times a day (QID) | ORAL | Status: DC | PRN

## 2014-12-22 MED ORDER — NAPROXEN 500 MG PO TBEC
500.0000 mg | DELAYED_RELEASE_TABLET | Freq: Two times a day (BID) | ORAL | Status: DC
Start: 2014-12-22 — End: 2015-09-27

## 2014-12-22 NOTE — Progress Notes (Signed)
Chief Complaint  Patient presents with  . Migraine    She is still having 1-2 migraines weekly, despite taking Inderal 60mg  twice daily.  She uses Maxalt for her more severe migraines but she has to take repeated doses for it to work.      PATIENT: Lauren Chapman DOB: 04/02/1970  Chief Complaint  Patient presents with  . Migraine    She is still having 1-2 migraines weekly, despite taking Inderal 60mg  twice daily.  She uses Maxalt for her more severe migraines but she has to take repeated doses for it to work.     HISTORICAL  Lauren Chapman is a 44 year old right-handed female, seen in refer by her primary care Bettey Costa, NP for evaluation of migraine in October 19 2014  I reviewed and summarized her office visit, reviewed laboratory in July 2016, mild elevated triglycerides 208,  She has a history of bipolar affective disorder, is on polypharmacy treatment, this including lamotrigine, Zoloft, Brintellix, Valium, Cogentin, gabapentin.  She started to have migraine since 44 years old, her typical migraine are lateralized severe pounding headache was associated light noise sensitivity, lasting 1-2 days, she often has alternating of sound, visual disturbance before the onset of migraine, and during severe headaches. over the years, she has had at least twice migraine each week, she has been taking frequent goody powders, which does not work anymore, now she take over-the-counter ibuprofen and Aleve, up to 10 tablets each day to try to get rid of headache with suboptimal response. She was giving a prescription of Imitrex upon why ER presentation for her migraines, which does not work well, she reported loose control of her bowel movements after taking Imitrex.   She was not able to identify trigger for her migraine, she does couple days each week because of her severe headaches, she has never tried any preventive medications in the past  UPDATE Dec 22 2014: She is now taking Inderal  60 mg twice a day, tolerating medication well, she did see mild to moderate improvement of her headache instead of twice week, she is now having her typical migraine once a week, she has left-sided retro-orbital area severe pounding headache with associated light noise sensitivity, nauseous, it can last for a few days, Maxalt works sometimes, but she has to repeat multiple times, sometimes she go to hospital for injection, she has tried migraine cocktail years, Compazine, Benadryl, Toradol with limited help, sometimes she has to use Demerol about once a year for migraine   REVIEW OF SYSTEMS: Full 14 system review of systems performed and notable only for: Shortness of breath, chest tightness, rectal pain, headaches, depression, anxiety, hallucinations  ALLERGIES: Allergies  Allergen Reactions  . Codeine Anaphylaxis    HOME MEDICATIONS: Current Outpatient Prescriptions  Medication Sig Dispense Refill  . benztropine (COGENTIN) 1 MG tablet Take 1 mg by mouth 2 (two) times daily.    . diazepam (VALIUM) 5 MG tablet Take 5 mg by mouth 1 day or 1 dose.    . esomeprazole (NEXIUM) 20 MG capsule Take 20 mg by mouth daily at 12 noon.    . gabapentin (NEURONTIN) 300 MG capsule Take 300 mg by mouth 3 (three) times daily.    Marland Kitchen ibuprofen (ADVIL,MOTRIN) 800 MG tablet Take 800 mg by mouth every 8 (eight) hours as needed.    . lamoTRIgine (LAMICTAL) 150 MG tablet Take 150 mg by mouth daily.    . Naproxen Sodium (ALEVE PO) Take by mouth as needed.    Marland Kitchen  Norethindrone Acetate-Ethinyl Estrad-FE (LOESTRIN 24 FE) 1-20 MG-MCG(24) tablet Take 1 tablet by mouth daily. 1 Package 11  . pantoprazole (PROTONIX) 40 MG tablet TAKE 1 TABLET EVERY MORNING 30 tablet 0  . perphenazine (TRILAFON) 16 MG tablet Take 32 mg by mouth at bedtime.    . propranolol (INDERAL) 60 MG tablet Take 1 tablet (60 mg total) by mouth 2 (two) times daily. 60 tablet 6  . risperiDONE (RISPERDAL) 2 MG tablet Take 2 mg by mouth 2 (two) times daily.      . rizatriptan (MAXALT-MLT) 10 MG disintegrating tablet Take 1 tablet (10 mg total) by mouth as needed. May repeat in 2 hours if needed 15 tablet 6  . Vortioxetine HBr (BRINTELLIX) 20 MG TABS Take by mouth daily.     No current facility-administered medications for this visit.    PAST MEDICAL HISTORY: Past Medical History  Diagnosis Date  . Anxiety   . Depression   . Bipolar affective psychosis ([place])   . GERD (gastroesophageal reflux disease)   . Migraine     PAST SURGICAL HISTORY: Past Surgical History  Procedure Laterality Date  . Cholecystectomy    . Carpal tunnel release    . Ganglion cyst excision      RIGHT wrist    FAMILY HISTORY: Family History  Problem Relation Age of Onset  . Alcohol abuse Father   . Crohn's disease Sister   . Stroke Brother 62  . Migraines Daughter   . GER disease Daughter   . Migraines Daughter   . GER disease Daughter   . Depression Mother     SOCIAL HISTORY:  Social History   Social History  . Marital Status: Divorced    Spouse Name: n/a  . Number of Children: 2  . Years of Education: 18   Occupational History  . Disabled     Formerly a Pharmacist, hospital.   Social History Main Topics  . Smoking status: Current Every Day Smoker -- 0.25 packs/day    Types: Cigarettes    Start date: 10/10/1981  . Smokeless tobacco: Never Used  . Alcohol Use: 0.0 oz/week    0 Standard drinks or equivalent per week     Comment: occasionally  . Drug Use: No  . Sexual Activity:    Partners: Male    Birth Control/ Protection: None   Other Topics Concern  . Not on file   Social History Narrative   Lives at home with her daughter.   Right-handed.   2-4 cups caffeine daily.     PHYSICAL EXAM   Filed Vitals:   12/22/14 1109  BP: 100/67  Pulse: 60  Height: 5\' 3"  (1.6 m)  Weight: 181 lb (82.101 kg)    Not recorded      Body mass index is 32.07 kg/(m^2).  PHYSICAL EXAMNIATION:  Gen: NAD, conversant, well nourised, obese, well groomed                      Cardiovascular: Regular rate rhythm, no peripheral edema, warm, nontender. Eyes: Conjunctivae clear without exudates or hemorrhage Neck: Supple, no carotid bruise. Pulmonary: Clear to auscultation bilaterally   NEUROLOGICAL EXAM:  MENTAL STATUS: Speech:    Speech is normal; fluent and spontaneous with normal comprehension.  Cognition:     Orientation to time, place and person     Normal recent and remote memory     Normal Attention span and concentration     Normal Language, naming, repeating,spontaneous speech  Fund of knowledge   CRANIAL NERVES: CN II: Visual fields are full to confrontation. Fundoscopic exam is normal with sharp discs and no vascular changes. Pupils are round equal and briskly reactive to light. CN III, IV, VI: extraocular movement are normal. No ptosis. CN V: Facial sensation is intact to pinprick in all 3 divisions bilaterally. Corneal responses are intact.  CN VII: Face is symmetric with normal eye closure and smile. CN VIII: Hearing is normal to rubbing fingers CN IX, X: Palate elevates symmetrically. Phonation is normal. CN XI: Head turning and shoulder shrug are intact CN XII: Tongue is midline with normal movements and no atrophy.  MOTOR: There is no pronator drift of out-stretched arms. Muscle bulk and tone are normal. Muscle strength is normal.  REFLEXES: Reflexes are 2+ and symmetric at the biceps, triceps, knees, and ankles. Plantar responses are flexor.  SENSORY: Intact to light touch, pinprick, position sense, and vibration sense are intact in fingers and toes.  COORDINATION: Rapid alternating movements and fine finger movements are intact. There is no dysmetria on finger-to-nose and heel-knee-shin.    GAIT/STANCE: Posture is normal. Gait is steady with normal steps, base, arm swing, and turning. Heel and toe walking are normal. Tandem gait is normal.  Romberg is absent.   DIAGNOSTIC DATA (LABS, IMAGING, TESTING) - I  reviewed patient records, labs, notes, testing and imaging myself where available.   ASSESSMENT AND PLAN  Lauren Chapman is a 44 y.o. female   Chronic migraine with aura  She is on polypharmacy already for her bipolar disorder  started Inderal 60 mg twice a day  Maxalt dissolvable 10 mg as the needed only provide mild to moderate inconsistent help   will change to Imitrex injection, along with naproxen 500 mg as needed, Phenergan 25 mg as needed  Bipolar disorder   On polypharmacy treatment, including Valium, Zoloft, lamotrigine, Brintellix, Cogentin   Marcial Pacas, M.D. Ph.D.  [facility] [address], [place], [place] [address] Ph: [phone] Fax: [phone]  CC: Bettey Costa, NP

## 2014-12-25 ENCOUNTER — Telehealth: Payer: Self-pay | Admitting: Neurology

## 2014-12-25 NOTE — Telephone Encounter (Signed)
Patient will be referred to [facility] for her botox injections. Called the patient to inform her but she did not answer, left a VM asking her to return my call.

## 2014-12-30 NOTE — Telephone Encounter (Signed)
Pt called back and would like a return call. Thank you

## 2015-01-06 NOTE — Telephone Encounter (Signed)
Returned the patients call. She did not answer, left a VM asking her to return my call.

## 2015-01-06 NOTE — Telephone Encounter (Signed)
Spoke with the patient and informed her of the referral.

## 2015-01-11 DIAGNOSIS — F3130 Bipolar disorder, current episode depressed, mild or moderate severity, unspecified: Secondary | ICD-10-CM | POA: Diagnosis not present

## 2015-01-11 DIAGNOSIS — F250 Schizoaffective disorder, bipolar type: Secondary | ICD-10-CM | POA: Diagnosis not present

## 2015-02-04 ENCOUNTER — Encounter: Payer: Medicare Other | Attending: Physical Medicine & Rehabilitation | Admitting: Physical Medicine & Rehabilitation

## 2015-02-04 ENCOUNTER — Encounter: Payer: Self-pay | Admitting: Physical Medicine & Rehabilitation

## 2015-02-04 VITALS — BP 93/62 | HR 78

## 2015-02-04 DIAGNOSIS — IMO0002 Reserved for concepts with insufficient information to code with codable children: Secondary | ICD-10-CM

## 2015-02-04 DIAGNOSIS — R51 Headache: Secondary | ICD-10-CM | POA: Diagnosis not present

## 2015-02-04 DIAGNOSIS — G43709 Chronic migraine without aura, not intractable, without status migrainosus: Secondary | ICD-10-CM

## 2015-02-04 NOTE — Progress Notes (Addendum)
Botox Injection for chronic migraine headaches ICD 10: G43.709   Dilution: 100 Units/ 94ml preservative free NS x2 Indication: refractory headaches (At least 15 days per month/headache lasting greater than 4 hours per day) incompletely responsive to other more conservative measures.  Informed consent was obtained after describing risks and benefits of the procedure with the patient. This includes bleeding, bruising, infection, excessive weakness, or medication side effects. A REMS form is on file and signed. Needle: 30g 1/2 inch needle   Number of units per muscle:  Right temporalis 20 units, 4 access points Left temporalis 20 units,  4 access points Right frontalis 10 units, 2 access points Left frontalis 10 units, 2 access points Procerus 5 units, 1 access point Right corrugator 5 units, 1 access point Left corrugator 5 units, 1 access point Right occipitalis 15 units, 3 access points Left occipitalis 15 units, 3 access points Right cervical paraspinal 10 units, 2 access points Left cervical paraspinals 10 units, 2 access points  Right trapezius 15 units, 3 access points Left trapezius 15 units, 3 access points   Remaining 45 units was discarded.  All injections were done after  after negative drawback for blood. The patient tolerated the procedure well. Post procedure instructions were given. A followup appointment was made.

## 2015-02-08 ENCOUNTER — Ambulatory Visit: Payer: Medicare Other | Admitting: Neurology

## 2015-02-23 DIAGNOSIS — F3130 Bipolar disorder, current episode depressed, mild or moderate severity, unspecified: Secondary | ICD-10-CM | POA: Diagnosis not present

## 2015-02-23 DIAGNOSIS — F250 Schizoaffective disorder, bipolar type: Secondary | ICD-10-CM | POA: Diagnosis not present

## 2015-03-08 ENCOUNTER — Ambulatory Visit (INDEPENDENT_AMBULATORY_CARE_PROVIDER_SITE_OTHER): Payer: Medicare Other | Admitting: Neurology

## 2015-03-08 ENCOUNTER — Encounter: Payer: Self-pay | Admitting: Neurology

## 2015-03-08 VITALS — BP 102/64 | HR 79 | Ht 63.0 in | Wt 179.0 lb

## 2015-03-08 DIAGNOSIS — G43709 Chronic migraine without aura, not intractable, without status migrainosus: Secondary | ICD-10-CM | POA: Diagnosis not present

## 2015-03-08 DIAGNOSIS — IMO0002 Reserved for concepts with insufficient information to code with codable children: Secondary | ICD-10-CM

## 2015-03-08 MED ORDER — PROPRANOLOL HCL 60 MG PO TABS: 60.0000 mg | ORAL_TABLET | Freq: Two times a day (BID) | ORAL | Status: DC

## 2015-03-08 MED ORDER — SUMATRIPTAN SUCCINATE 6 MG/0.5ML ~~LOC~~ SOLN: 6.0000 mg | SUBCUTANEOUS | Status: DC | PRN

## 2015-03-08 NOTE — Progress Notes (Signed)
Chief Complaint  Patient presents with  . Migraine    Says she typically has two headache days per week.  Feels her migraines are now less severe and easily resolve with Imitrex.   Chief Complaint  Patient presents with  . Migraine    Says she typically has two headache days per week.  Feels her migraines are now less severe and easily resolve with Imitrex.      PATIENT: Lauren Chapman DOB: 03/09/1970  Chief Complaint  Patient presents with  . Migraine    Says she typically has two headache days per week.  Feels her migraines are now less severe and easily resolve with Imitrex.     HISTORICAL  [name] is a 45 year old right-handed female, seen in refer by her primary care Bettey Costa, NP for evaluation of migraine in October 19 2014  I reviewed and summarized her office visit, reviewed laboratory in July 2016, mild elevated triglycerides 208,  She has a history of bipolar affective disorder, is on polypharmacy treatment, this including lamotrigine, Zoloft, Brintellix, Valium, Cogentin, gabapentin.  She started to have migraine since 45 years old, her typical migraine are lateralized severe pounding headache was associated light noise sensitivity, lasting 1-2 days, she often has alternating of sound, visual disturbance before the onset of migraine, and during severe headaches. over the years, she has had at least twice migraine each week, she has been taking frequent goody powders, which does not work anymore, now she take over-the-counter ibuprofen and Aleve, up to 10 tablets each day to try to get rid of headache with suboptimal response. She was giving a prescription of Imitrex upon why ER presentation for her migraines, which does not work well, she reported loose control of her bowel movements after taking Imitrex.   She was not able to identify trigger for her migraine, she does couple days each week because of her severe headaches, she has never tried any preventive  medications in the past  UPDATE Dec 22 2014: She is now taking Inderal 60 mg twice a day, tolerating medication well, she did see mild to moderate improvement of her headache instead of twice week, she is now having her typical migraine once a week, she has left-sided retro-orbital area severe pounding headache with associated light noise sensitivity, nauseous, it can last for a few days, Maxalt works sometimes, but she has to repeat multiple times, sometimes she go to hospital for injection, she has tried migraine cocktail years, Compazine, Benadryl, Toradol with limited help, sometimes she has to use Demerol about once a year for migraine  UPDATE Mar 08 2015: She did have Botox injection as migraine prevention by rehabilitation physician Dr. Posey Pronto February 04 2015, which has helped her headache, she has less severe, less frequent headaches, couple times each week, she is now receiving naproxen 1-2 times each week for mild to moderate headaches, use Imitrex injection for more severe headaches, which works well for her,  REVIEW OF SYSTEMS: Full 14 system review of systems performed and notable only for: Fever, runny nose, cough, diarrhea, sleep walking, sleep talking, joint pain, dizziness, headaches, depression anxiety hallucinations  ALLERGIES: Allergies  Allergen Reactions  . Codeine Anaphylaxis    HOME MEDICATIONS: Current Outpatient Prescriptions  Medication Sig Dispense Refill  . benztropine (COGENTIN) 1 MG tablet Take 1 mg by mouth 2 (two) times daily.    . diazepam (VALIUM) 5 MG tablet Take 5 mg by mouth 1 day or 1 dose.    Marland Kitchen  esomeprazole (NEXIUM) 20 MG capsule Take 20 mg by mouth daily at 12 noon.    . gabapentin (NEURONTIN) 400 MG capsule Take 1,200 mg by mouth 4 (four) times daily.  2  . ibuprofen (ADVIL,MOTRIN) 800 MG tablet Take 800 mg by mouth every 8 (eight) hours as needed.    . lamoTRIgine (LAMICTAL) 150 MG tablet Take 150 mg by mouth daily.    . naproxen (EC NAPROSYN) 500 MG  EC tablet Take 1 tablet (500 mg total) by mouth 2 (two) times daily with a meal. 60 tablet 3  . perphenazine (TRILAFON) 16 MG tablet Take 32 mg by mouth at bedtime.    . promethazine (PHENERGAN) 25 MG tablet Take 1 tablet (25 mg total) by mouth every 6 (six) hours as needed for nausea or vomiting. 30 tablet 6  . propranolol (INDERAL) 60 MG tablet Take 1 tablet (60 mg total) by mouth 2 (two) times daily. 60 tablet 6  . risperiDONE (RISPERDAL) 3 MG tablet Take 3 mg by mouth daily. 3 mg q am and 6 mg q pm  2  . SUMAtriptan (IMITREX) 6 MG/0.5ML SOLN injection Inject 0.5 mLs (6 mg total) into the skin every 2 (two) hours as needed for migraine or headache. May repeat in 2 hours if headache persists or recurs. 12 vial 6  . Vortioxetine HBr (BRINTELLIX) 20 MG TABS Take by mouth daily.     No current facility-administered medications for this visit.    PAST MEDICAL HISTORY: Past Medical History  Diagnosis Date  . Anxiety   . Depression   . Bipolar affective psychosis ([place])   . GERD (gastroesophageal reflux disease)   . Migraine     PAST SURGICAL HISTORY: Past Surgical History  Procedure Laterality Date  . Cholecystectomy    . Carpal tunnel release    . Ganglion cyst excision      RIGHT wrist    FAMILY HISTORY: Family History  Problem Relation Age of Onset  . Alcohol abuse Father   . Crohn's disease Sister   . Stroke Brother 56  . Migraines Daughter   . GER disease Daughter   . Migraines Daughter   . GER disease Daughter   . Depression Mother     SOCIAL HISTORY:  Social History   Social History  . Marital Status: Divorced    Spouse Name: n/a  . Number of Children: 2  . Years of Education: 18   Occupational History  . Disabled     Formerly a Pharmacist, hospital.   Social History Main Topics  . Smoking status: Current Every Day Smoker -- 0.25 packs/day    Types: Cigarettes    Start date: 10/10/1981  . Smokeless tobacco: Never Used  . Alcohol Use: 0.0 oz/week    0 Standard  drinks or equivalent per week     Comment: occasionally  . Drug Use: No  . Sexual Activity:    Partners: Male    Birth Control/ Protection: None   Other Topics Concern  . Not on file   Social History Narrative   Lives at home with her daughter.   Right-handed.   2-4 cups caffeine daily.     PHYSICAL EXAM   Filed Vitals:   03/08/15 1158  BP: 102/64  Pulse: 79  Height: 5\' 3"  (1.6 m)  Weight: 179 lb (81.194 kg)    Not recorded      Body mass index is 31.72 kg/(m^2).  PHYSICAL EXAMNIATION:  Gen: NAD, conversant, well nourised, obese, well  groomed                     Cardiovascular: Regular rate rhythm, no peripheral edema, warm, nontender. Eyes: Conjunctivae clear without exudates or hemorrhage Neck: Supple, no carotid bruise. Pulmonary: Clear to auscultation bilaterally   NEUROLOGICAL EXAM:  MENTAL STATUS: Speech:    Speech is normal; fluent and spontaneous with normal comprehension.  Cognition:     Orientation to time, place and person     Normal recent and remote memory     Normal Attention span and concentration     Normal Language, naming, repeating,spontaneous speech     Fund of knowledge   CRANIAL NERVES: CN II: Visual fields are full to confrontation. Fundoscopic exam is normal with sharp discs. Pupils are round equal and briskly reactive to light. CN III, IV, VI: extraocular movement are normal. No ptosis. CN V: Facial sensation is intact to pinprick in all 3 divisions bilaterally. Corneal responses are intact.  CN VII: Face is symmetric with normal eye closure and smile. CN VIII: Hearing is normal to rubbing fingers CN IX, X: Palate elevates symmetrically. Phonation is normal. CN XI: Head turning and shoulder shrug are intact CN XII: Tongue is midline with normal movements and no atrophy.  MOTOR: There is no pronator drift of out-stretched arms. Muscle bulk and tone are normal. Muscle strength is normal.  REFLEXES: Reflexes are 2+ and symmetric  at the biceps, triceps, knees, and ankles. Plantar responses are flexor.  SENSORY: Intact to light touch, pinprick, position sense, and vibration sense are intact in fingers and toes.  COORDINATION: Rapid alternating movements and fine finger movements are intact. There is no dysmetria on finger-to-nose and heel-knee-shin.    GAIT/STANCE: Posture is normal. Gait is steady with normal steps, base, arm swing, and turning. Heel and toe walking are normal. Tandem gait is normal.  Romberg is absent.   DIAGNOSTIC DATA (LABS, IMAGING, TESTING) - I reviewed patient records, labs, notes, testing and imaging myself where available.   ASSESSMENT AND PLAN  DONNIE PRUCHA is a 45 y.o. female   Chronic migraine with aura  She is on polypharmacy already for her bipolar disorder  The field 60 mg twice a day  Maxalt dissolvable 10 mg as the needed only provide mild to moderate inconsistent help   will change to Imitrex injection, along with naproxen 500 mg as needed, Phenergan 25 mg as needed for more severe intractable headaches  Continue Botox injection as migraine prevention   Bipolar disorder   On polypharmacy treatment, including Valium, Zoloft, lamotrigine, Brintellix, Cogentin   Marcial Pacas, M.D. Ph.D.  [facility] [address] [place], [place] [address] Ph: [phone] Fax: [phone]  CC: Bettey Costa, NP

## 2015-03-16 DIAGNOSIS — F250 Schizoaffective disorder, bipolar type: Secondary | ICD-10-CM | POA: Diagnosis not present

## 2015-03-18 ENCOUNTER — Encounter: Payer: Medicare Other | Attending: Physical Medicine & Rehabilitation | Admitting: Physical Medicine & Rehabilitation

## 2015-03-18 ENCOUNTER — Encounter: Payer: Self-pay | Admitting: Physical Medicine & Rehabilitation

## 2015-03-18 VITALS — BP 100/64 | HR 62 | Resp 14

## 2015-03-18 DIAGNOSIS — G43709 Chronic migraine without aura, not intractable, without status migrainosus: Secondary | ICD-10-CM | POA: Diagnosis not present

## 2015-03-18 DIAGNOSIS — R51 Headache: Secondary | ICD-10-CM | POA: Diagnosis not present

## 2015-03-18 DIAGNOSIS — IMO0002 Reserved for concepts with insufficient information to code with codable children: Secondary | ICD-10-CM

## 2015-03-18 NOTE — Progress Notes (Signed)
Subjective:    Patient ID: Lauren Chapman, female    DOB: 02-15-1970, 45 y.o.   MRN: [id number]  HPI  45 y/o female presents for follow up after Botox for migraines.  This was her first botox injection for migraines.  She notes improvement in migraines.  She is down to 5 migraines/month.  Prior to injection, the migraines affected her vision and hearing, but now, do not appear to do so.  The only side effect she notes is a decreased ability to raise her eye brows, but she states she still can if she needs to.  She believes this to be a minor inconvenience and would like to proceed with the same regiment on next injection.    Pain Inventory Average Pain 8 Pain Right Now 2 My pain is sharp and stabbing  In the last 24 hours, has pain interfered with the following? General activity 0 Relation with others 0 Enjoyment of life 0 What TIME of day is your pain at its worst? daytime Sleep (in general) Poor  Pain is worse with: sitting and some activites Pain improves with: medication Relief from Meds: 4  Mobility walk without assistance how many minutes can you walk? 20 do you drive?  yes  Function Do you have any goals in this area?  no  Neuro/Psych bowel control problems depression anxiety suicidal thoughts - no active plans  Prior Studies Any changes since last visit?  no  Physicians involved in your care Any changes since last visit?  no   Family History  Problem Relation Age of Onset  . Alcohol abuse Father   . Crohn's disease Sister   . Stroke Brother 110  . Migraines Daughter   . GER disease Daughter   . Migraines Daughter   . GER disease Daughter   . Depression Mother    Social History   Social History  . Marital Status: Divorced    Spouse Name: n/a  . Number of Children: 2  . Years of Education: 18   Occupational History  . Disabled     Formerly a Pharmacist, hospital.   Social History Main Topics  . Smoking status: Current Every Day Smoker -- 0.25 packs/day    Types: Cigarettes    Start date: 10/10/1981  . Smokeless tobacco: Never Used  . Alcohol Use: 0.0 oz/week    0 Standard drinks or equivalent per week     Comment: occasionally  . Drug Use: No  . Sexual Activity:    Partners: Male    Birth Control/ Protection: None   Other Topics Concern  . None   Social History Narrative   Lives at home with her daughter.   Right-handed.   2-4 cups caffeine daily.   Past Surgical History  Procedure Laterality Date  . Cholecystectomy    . Carpal tunnel release    . Ganglion cyst excision      RIGHT wrist   Past Medical History  Diagnosis Date  . Anxiety   . Depression   . Bipolar affective psychosis ([place])   . GERD (gastroesophageal reflux disease)   . Migraine    BP 100/64 mmHg  Pulse 62  Resp 14  SpO2 96%  Opioid Risk Score:   Fall Risk Score:  `1  Depression screen PHQ 2/9  Depression screen PHQ 2/9 02/04/2015  Decreased Interest 0  Down, Depressed, Hopeless 0  PHQ - 2 Score 0     Review of Systems  Respiratory: Positive for cough.  Gastrointestinal: Positive for nausea, vomiting and diarrhea.  All other systems reviewed and are negative.     Objective:   Physical Exam HENT: Normocephalic, Atraumatic Eyes: EOMI, Conj WNL Cardio: S1, S2 normal, RRR Pulm: B/l clear to auscultation.  Effort normal Abd: Soft, non-distended, non-tender, BS+ MSK:  Gait WNL.   No edema. Neuro: CN II-XII grossly intact.    Sensation intact to light touch along face Skin: Warm and Dry.  No rashes.     Assessment & Plan:  45 y/o female presents for follow up after Botox for migraines.  1.  Chronic migraines  Since the age of 12  1st injection on 02/04/15 with improvement in frequency and intensity, no side effects (except for limited ability to raise eyebrows-minor inconvenience for pt)  Will plan for repeat injection in 6 weeks  RTC 6 weeks for repeat botox injection

## 2015-03-18 NOTE — Progress Notes (Signed)
Subjective:    Patient ID: Lauren Chapman, female    DOB: 07/27/1970, 45 y.o.   MRN: [id number]  HPI  45 y/o female presents for follow up after Botox for migraines.  This was her first botox injection for migraines.  She notes improvement in migraines.  She is down to 5 migraines/month.  Prior to injection, the migraines affected her vision and hearing, but now, do not appear to do so.  The only side effect she notes is a decreased ability to raise her eye brows, but she states she still can if she needs to.  She believes this to be a minor inconvenience and would like to proceed with the same regiment on next injection.    Pain Inventory Average Pain 8 Pain Right Now 2 My pain is sharp and stabbing  In the last 24 hours, has pain interfered with the following? General activity 0 Relation with others 0 Enjoyment of life 0 What TIME of day is your pain at its worst? daytime Sleep (in general) Poor  Pain is worse with: sitting and some activites Pain improves with: medication Relief from Meds: 4  Mobility walk without assistance how many minutes can you walk? 20 do you drive?  yes  Function Do you have any goals in this area?  no  Neuro/Psych bowel control problems depression anxiety suicidal thoughts - no active plans  Prior Studies Any changes since last visit?  no  Physicians involved in your care Any changes since last visit?  no   Family History  Problem Relation Age of Onset  . Alcohol abuse Father   . Crohn's disease Sister   . Stroke Brother 13  . Migraines Daughter   . GER disease Daughter   . Migraines Daughter   . GER disease Daughter   . Depression Mother    Social History   Social History  . Marital Status: Divorced    Spouse Name: n/a  . Number of Children: 2  . Years of Education: 18   Occupational History  . Disabled     Formerly a Pharmacist, hospital.   Social History Main Topics  . Smoking status: Current Every Day Smoker -- 0.25 packs/day    Types: Cigarettes    Start date: 10/10/1981  . Smokeless tobacco: Never Used  . Alcohol Use: 0.0 oz/week    0 Standard drinks or equivalent per week     Comment: occasionally  . Drug Use: No  . Sexual Activity:    Partners: Male    Birth Control/ Protection: None   Other Topics Concern  . None   Social History Narrative   Lives at home with her daughter.   Right-handed.   2-4 cups caffeine daily.   Past Surgical History  Procedure Laterality Date  . Cholecystectomy    . Carpal tunnel release    . Ganglion cyst excision      RIGHT wrist   Past Medical History  Diagnosis Date  . Anxiety   . Depression   . Bipolar affective psychosis ([place])   . GERD (gastroesophageal reflux disease)   . Migraine    BP 100/64 mmHg  Pulse 62  Resp 14  SpO2 96%  Opioid Risk Score:   Fall Risk Score:  `1  Depression screen PHQ 2/9  Depression screen PHQ 2/9 02/04/2015  Decreased Interest 0  Down, Depressed, Hopeless 0  PHQ - 2 Score 0     Review of Systems  Respiratory: Positive for cough.  Gastrointestinal: Positive for nausea, vomiting and diarrhea.  All other systems reviewed and are negative.     Objective:   Physical Exam HENT: Normocephalic, Atraumatic Eyes: EOMI, Conj WNL Cardio: S1, S2 normal, RRR Pulm: B/l clear to auscultation.  Effort normal Abd: Soft, non-distended, non-tender, BS+ MSK:  Gait WNL.   No edema. Neuro: CN II-XII grossly intact.    Sensation intact to light touch along face Skin: Warm and Dry.  No rashes.     Assessment & Plan:  45 y/o female presents for follow up after Botox for migraines.  1.  Chronic migraines  Since the age of 39  1st injection on 02/04/15 with improvement in frequency and intensity, no side effects (except for limited ability to raise eyebrows-minor inconvenience for pt)  Will plan for repeat injection in 6 weeks  RTC 6 weeks for repeat botox injection

## 2015-03-22 ENCOUNTER — Ambulatory Visit (INDEPENDENT_AMBULATORY_CARE_PROVIDER_SITE_OTHER): Payer: Medicare Other | Admitting: Family Medicine

## 2015-03-22 ENCOUNTER — Ambulatory Visit (INDEPENDENT_AMBULATORY_CARE_PROVIDER_SITE_OTHER): Payer: Medicare Other

## 2015-03-22 ENCOUNTER — Encounter: Payer: Self-pay | Admitting: *Deleted

## 2015-03-22 VITALS — BP 94/62 | HR 67 | Temp 98.4°F | Resp 16 | Ht 63.0 in | Wt 178.0 lb

## 2015-03-22 DIAGNOSIS — R509 Fever, unspecified: Secondary | ICD-10-CM

## 2015-03-22 DIAGNOSIS — R05 Cough: Secondary | ICD-10-CM

## 2015-03-22 DIAGNOSIS — J111 Influenza due to unidentified influenza virus with other respiratory manifestations: Secondary | ICD-10-CM

## 2015-03-22 DIAGNOSIS — R0602 Shortness of breath: Secondary | ICD-10-CM

## 2015-03-22 DIAGNOSIS — R6889 Other general symptoms and signs: Secondary | ICD-10-CM

## 2015-03-22 LAB — POCT CBC
GRANULOCYTE PERCENT: 57.2 % (ref 37–80)
HCT, POC: 39.3 % (ref 37.7–47.9)
Hemoglobin: 14.2 g/dL (ref 12.2–16.2)
LYMPH, POC: 3.3 (ref 0.6–3.4)
MCH, POC: 32.7 pg — AB (ref 27–31.2)
MCHC: 36.3 g/dL — AB (ref 31.8–35.4)
MCV: 90.1 fL (ref 80–97)
MID (CBC): 0.5 (ref 0–0.9)
MPV: 7.8 fL (ref 0–99.8)
PLATELET COUNT, POC: 224 10*3/uL (ref 142–424)
POC Granulocyte: 5 (ref 2–6.9)
POC LYMPH %: 37.4 % (ref 10–50)
POC MID %: 5.4 %M (ref 0–12)
RBC: 4.36 M/uL (ref 4.04–5.48)
RDW, POC: 13 %
WBC: 8.8 10*3/uL (ref 4.6–10.2)

## 2015-03-22 LAB — POCT INFLUENZA A/B
INFLUENZA A, POC: NEGATIVE
INFLUENZA B, POC: NEGATIVE

## 2015-03-22 MED ORDER — BENZONATATE 100 MG PO CAPS: 100.0000 mg | ORAL_CAPSULE | Freq: Three times a day (TID) | ORAL | Status: DC | PRN

## 2015-03-22 MED ORDER — FLUTICASONE PROPIONATE 50 MCG/ACT NA SUSP: 2.0000 | Freq: Every day | NASAL | Status: DC

## 2015-03-22 NOTE — Progress Notes (Signed)
Subjective:    Patient ID: Lauren Chapman, female    DOB: January 30, 1970, 45 y.o.   MRN: [id number]  03/22/2015  Laryngitis; URI; and Sinusitis   HPI This 45 y.o. female presents for evaluation of laryngitis, sinus congestion. Onset one week ago.  +fever Tmax 100.0; last fever today.  +chills/sweats.  +body aches.  +HA.  Lungs are on fire; sinuses on fire.  L ear pain mild.  No sore throat.  +rhinorrhea green and thick for three days.  +nasal congestion.  +coughing; +SOB; small amount of sputum production.  Mild wheezing; no history of asthma; +tobacco abuse.  No vomiting or diarrhea.  No flu vaccine.  Homemaker.  Daughter recently sick; father with the flu.  Taking Nyquil, Mucinex bid.  Has been using Afrin for two days.    Review of Systems  Constitutional: Positive for fever, chills, diaphoresis and fatigue.  HENT: Positive for congestion, ear pain, postnasal drip, rhinorrhea and sinus pressure. Negative for sore throat, trouble swallowing and voice change.   Respiratory: Positive for cough and shortness of breath.   Gastrointestinal: Negative for nausea, vomiting and diarrhea.  Neurological: Positive for headaches. Negative for dizziness and light-headedness.    Past Medical History  Diagnosis Date  . Anxiety   . Depression   . Bipolar affective psychosis ([place])   . GERD (gastroesophageal reflux disease)   . Migraine    Past Surgical History  Procedure Laterality Date  . Cholecystectomy    . Carpal tunnel release    . Ganglion cyst excision      RIGHT wrist   Allergies  Allergen Reactions  . Codeine Anaphylaxis    Social History   Social History  . Marital Status: Divorced    Spouse Name: n/a  . Number of Children: 2  . Years of Education: 18   Occupational History  . Disabled     Formerly a Pharmacist, hospital.   Social History Main Topics  . Smoking status: Current Every Day Smoker -- 0.25 packs/day for 33 years    Types: Cigarettes    Start date: 10/10/1981  . Smokeless  tobacco: Never Used  . Alcohol Use: 0.0 oz/week    0 Standard drinks or equivalent per week     Comment: occasionally  . Drug Use: No  . Sexual Activity:    Partners: Male    Birth Control/ Protection: None   Other Topics Concern  . Not on file   Social History Narrative   Lives at home with her daughter.   Right-handed.   2-4 cups caffeine daily.   Family History  Problem Relation Age of Onset  . Alcohol abuse Father   . Crohn's disease Sister   . Stroke Brother 24  . Migraines Daughter   . GER disease Daughter   . Migraines Daughter   . GER disease Daughter   . Depression Mother        Objective:    BP 94/62 mmHg  Pulse 67  Temp(Src) 98.4 F (36.9 C) (Oral)  Resp 16  Ht 5\' 3"  (1.6 m)  Wt 178 lb (80.74 kg)  BMI 31.54 kg/m2  SpO2 98% Physical Exam  Constitutional: She is oriented to person, place, and time. She appears well-developed and well-nourished. No distress.  HENT:  Head: Normocephalic and atraumatic.  Right Ear: Tympanic membrane, external ear and ear canal normal.  Left Ear: Tympanic membrane and ear canal normal.  Nose: Mucosal edema and rhinorrhea present. Right sinus exhibits no maxillary sinus tenderness  and no frontal sinus tenderness. Left sinus exhibits no maxillary sinus tenderness and no frontal sinus tenderness.  Mouth/Throat: Uvula is midline and mucous membranes are normal.  Eyes: Conjunctivae and EOM are normal. Pupils are equal, round, and reactive to light.  Neck: Normal range of motion. Neck supple. Carotid bruit is not present. No thyromegaly present.  Cardiovascular: Normal rate, regular rhythm, normal heart sounds and intact distal pulses.  Exam reveals no gallop and no friction rub.   No murmur heard. Pulmonary/Chest: Effort normal and breath sounds normal. She has no wheezes. She has no rales.  Abdominal: Soft. Bowel sounds are normal. She exhibits no distension and no mass. There is no tenderness. There is no rebound and no  guarding.  Lymphadenopathy:    She has no cervical adenopathy.  Neurological: She is alert and oriented to person, place, and time. No cranial nerve deficit.  Skin: Skin is warm and dry. No rash noted. She is not diaphoretic. No erythema. No pallor.  Psychiatric: She has a normal mood and affect. Her behavior is normal.   Results for orders placed or performed in visit on 03/22/15  POCT Influenza A/B  Result Value Ref Range   Influenza A, POC Negative Negative   Influenza B, POC Negative Negative  POCT CBC  Result Value Ref Range   WBC 8.8 4.6 - 10.2 K/uL   Lymph, poc 3.3 0.6 - 3.4   POC LYMPH PERCENT 37.4 10 - 50 %L   MID (cbc) 0.5 0 - 0.9   POC MID % 5.4 0 - 12 %M   POC Granulocyte 5.0 2 - 6.9   Granulocyte percent 57.2 37 - 80 %G   RBC 4.36 4.04 - 5.48 M/uL   Hemoglobin 14.2 12.2 - 16.2 g/dL   HCT, POC 39.3 37.7 - 47.9 %   MCV 90.1 80 - 97 fL   MCH, POC 32.7 (A) 27 - 31.2 pg   MCHC 36.3 (A) 31.8 - 35.4 g/dL   RDW, POC 13.0 %   Platelet Count, POC 224 142 - 424 K/uL   MPV 7.8 0 - 99.8 fL      No results found. Assessment & Plan:   1. Influenza   2. Flu-like symptoms   3. Shortness of breath     Orders Placed This Encounter  Procedures  . DG Chest 2 View    Standing Status: Future     Number of Occurrences: 1     Standing Expiration Date: 03/21/2016    Order Specific Question:  Reason for Exam (SYMPTOM  OR DIAGNOSIS REQUIRED)    Answer:  cough, SOB, fever    Order Specific Question:  Is the patient pregnant?    Answer:  No    Order Specific Question:  Preferred imaging location?    Answer:  External  . POCT Influenza A/B  . POCT CBC   Meds ordered this encounter  Medications  . benzonatate (TESSALON) 100 MG capsule    Sig: Take 1-2 capsules (100-200 mg total) by mouth 3 (three) times daily as needed for cough.    Dispense:  40 capsule    Refill:  0  . fluticasone (FLONASE) 50 MCG/ACT nasal spray    Sig: Place 2 sprays into both nostrils daily.     Dispense:  16 g    Refill:  6    No Follow-up on file.    Zuzanna Maroney Elayne Guerin, M.D. [facility] [place], [place]  [address] [phone] phone [phone] fax

## 2015-03-22 NOTE — Patient Instructions (Signed)
1. Continue Mucinex twice daily. 2.  Continue Afrin nasal spray twice daily for three more days. 3.  ADD Tessalon Perles 2 capsules three times daily for cough. 4. ADD Flonase nasal spray 2 sprays into each nostril once daily. 5. Call on Thursday if not improving. 6. You are contagious as long as you have fever.    Influenza, Adult Influenza ("the flu") is a viral infection of the respiratory tract. It occurs more often in winter months because people spend more time in close contact with one another. Influenza can make you feel very sick. Influenza easily spreads from person to person (contagious). CAUSES  Influenza is caused by a virus that infects the respiratory tract. You can catch the virus by breathing in droplets from an infected person's cough or sneeze. You can also catch the virus by touching something that was recently contaminated with the virus and then touching your mouth, nose, or eyes. RISKS AND COMPLICATIONS You may be at risk for a more severe case of influenza if you smoke cigarettes, have diabetes, have chronic heart disease (such as heart failure) or lung disease (such as asthma), or if you have a weakened immune system. Elderly people and pregnant women are also at risk for more serious infections. The most common problem of influenza is a lung infection (pneumonia). Sometimes, this problem can require emergency medical care and may be life threatening. SIGNS AND SYMPTOMS  Symptoms typically last 4 to 10 days and may include:  Fever.  Chills.  Headache, body aches, and muscle aches.  Sore throat.  Chest discomfort and cough.  Poor appetite.  Weakness or feeling tired.  Dizziness.  Nausea or vomiting. DIAGNOSIS  Diagnosis of influenza is often made based on your history and a physical exam. A nose or throat swab test can be done to confirm the diagnosis. TREATMENT  In mild cases, influenza goes away on its own. Treatment is directed at relieving symptoms.  For more severe cases, your health care provider may prescribe antiviral medicines to shorten the sickness. Antibiotic medicines are not effective because the infection is caused by a virus, not by bacteria. HOME CARE INSTRUCTIONS  Take medicines only as directed by your health care provider.  Use a cool mist humidifier to make breathing easier.  Get plenty of rest until your temperature returns to normal. This usually takes 3 to 4 days.  Drink enough fluid to keep your urine clear or pale yellow.  Cover yourmouth and nosewhen coughing or sneezing,and wash your handswellto prevent thevirusfrom spreading.  Stay homefromwork orschool untilthe fever is gonefor at least 72full day. PREVENTION  An annual influenza vaccination (flu shot) is the best way to avoid getting influenza. An annual flu shot is now routinely recommended for all adults in the [place] IF:  You experiencechest pain, yourcough worsens,or you producemore mucus.  Youhave nausea,vomiting, ordiarrhea.  Your fever returns or gets worse. SEEK IMMEDIATE MEDICAL CARE IF:  You havetrouble breathing, you become short of breath,or your skin ornails becomebluish.  You have severe painor stiffnessin the neck.  You develop a sudden headache, or pain in the face or ear.  You have nausea or vomiting that you cannot control. MAKE SURE YOU:   Understand these instructions.  Will watch your condition.  Will get help right away if you are not doing well or get worse.   This information is not intended to replace advice given to you by your health care provider. Make sure you discuss any  questions you have with your health care provider.   Document Released: 01/07/2000 Document Revised: 01/30/2014 Document Reviewed: 04/10/2011 Elsevier Interactive Patient Education [employer].

## 2015-04-06 ENCOUNTER — Ambulatory Visit (INDEPENDENT_AMBULATORY_CARE_PROVIDER_SITE_OTHER): Payer: Medicare Other | Admitting: Family Medicine

## 2015-04-06 VITALS — BP 110/70 | HR 62 | Temp 98.3°F | Resp 18 | Wt 177.2 lb

## 2015-04-06 DIAGNOSIS — J069 Acute upper respiratory infection, unspecified: Secondary | ICD-10-CM

## 2015-04-06 DIAGNOSIS — F250 Schizoaffective disorder, bipolar type: Secondary | ICD-10-CM | POA: Diagnosis not present

## 2015-04-06 MED ORDER — AMOXICILLIN 500 MG PO CAPS: 500.0000 mg | ORAL_CAPSULE | Freq: Three times a day (TID) | ORAL | Status: DC

## 2015-04-06 NOTE — Patient Instructions (Signed)
Please let us know if you're not any better by the weekend. Make sure you drink plenty of fluids to try to loosen up any phlegm in your throat or chest.

## 2015-04-06 NOTE — Progress Notes (Signed)
By signing my name below, I, Lauren Chapman, attest that this documentation has been prepared under the direction and in the presence of Robyn Haber, MD. Electronically Signed: Moises Chapman, [place]. 04/06/2015 , 3:09 PM .  Patient was seen in room 13 .   Patient ID: Lauren Chapman MRN: [id number], DOB: September 15, 1970, 45 y.o. Date of Encounter: 04/06/2015  Primary Physician: Bettey Costa, NP  Chief Complaint:  Chief Complaint  Patient presents with  . Sore Throat    x3 weeks  . Laryngitis    x3 weeks    HPI:  Lauren Chapman is a 45 y.o. female who presents to [facility] complaining of burning sore throat and laryngitis for 3 weeks now. She was seen 2 weeks ago by Dr. Tamala Julian; she was given tessalon perles and flonase for flu-like symptoms. She states that she's not feeling any better. She denies any sleep troubles or coughs. She is a current smoker.   She's on disability and currently unemployed. She lives with her daughter.   Past Medical History  Diagnosis Date  . Anxiety   . Depression   . Bipolar affective psychosis ([place])   . GERD (gastroesophageal reflux disease)   . Migraine      Home Meds: Prior to Admission medications   Medication Sig Start Date End Date Taking? Authorizing Provider  benztropine (COGENTIN) 1 MG tablet Take 1 mg by mouth 2 (two) times daily.   Yes Historical Provider, MD  diazepam (VALIUM) 5 MG tablet Take 5 mg by mouth 1 day or 1 dose.   Yes Historical Provider, MD  esomeprazole (NEXIUM) 20 MG capsule Take 20 mg by mouth daily at 12 noon.   Yes Historical Provider, MD  fluticasone (FLONASE) 50 MCG/ACT nasal spray Place 2 sprays into both nostrils daily. 03/22/15  Yes Wardell Honour, MD  gabapentin (NEURONTIN) 400 MG capsule Take 1,200 mg by mouth 4 (four) times daily. 01/13/15  Yes Historical Provider, MD  ibuprofen (ADVIL,MOTRIN) 800 MG tablet Take 800 mg by mouth every 8 (eight) hours as needed.   Yes Historical Provider,  MD  lamoTRIgine (LAMICTAL) 150 MG tablet Take 150 mg by mouth daily.   Yes Historical Provider, MD  naproxen (EC NAPROSYN) 500 MG EC tablet Take 1 tablet (500 mg total) by mouth 2 (two) times daily with a meal. 12/22/14  Yes Marcial Pacas, MD  perphenazine (TRILAFON) 16 MG tablet Take 32 mg by mouth at bedtime.   Yes Historical Provider, MD  promethazine (PHENERGAN) 25 MG tablet Take 1 tablet (25 mg total) by mouth every 6 (six) hours as needed for nausea or vomiting. 12/22/14  Yes Marcial Pacas, MD  propranolol (INDERAL) 60 MG tablet Take 1 tablet (60 mg total) by mouth 2 (two) times daily. 03/08/15  Yes Marcial Pacas, MD  risperiDONE (RISPERDAL) 3 MG tablet Take 3 mg by mouth daily. 3 mg q am and 6 mg q pm 01/13/15  Yes Historical Provider, MD  SUMAtriptan (IMITREX) 6 MG/0.5ML SOLN injection Inject 0.5 mLs (6 mg total) into the skin every 2 (two) hours as needed for migraine or headache. May repeat in 2 hours if headache persists or recurs. 03/08/15  Yes Marcial Pacas, MD  Vortioxetine HBr (BRINTELLIX) 20 MG TABS Take by mouth daily.   Yes Historical Provider, MD  benzonatate (TESSALON) 100 MG capsule Take 1-2 capsules (100-200 mg total) by mouth 3 (three) times daily as needed for cough. Patient not taking: Reported on 04/06/2015 03/22/15  Wardell Honour, MD    Allergies:  Allergies  Allergen Reactions  . Codeine Anaphylaxis    Social History   Social History  . Marital Status: Divorced    Spouse Name: n/a  . Number of Children: 2  . Years of Education: 18   Occupational History  . Disabled     Formerly a Pharmacist, hospital.   Social History Main Topics  . Smoking status: Current Every Day Smoker -- 0.25 packs/day for 33 years    Types: Cigarettes    Start date: 10/10/1981  . Smokeless tobacco: Never Used  . Alcohol Use: 0.0 oz/week    0 Standard drinks or equivalent per week     Comment: occasionally  . Drug Use: No  . Sexual Activity:    Partners: Male    Birth Control/ Protection: None   Other  Topics Concern  . Not on file   Social History Narrative   Lives at home with her daughter.   Right-handed.   2-4 cups caffeine daily.     Review of Systems: Constitutional: negative for fever, chills, night sweats, weight changes, or fatigue; positive for chills  HEENT: negative for vision changes, hearing loss, congestion, rhinorrhea, epistaxis, or sinus pressure; positive for sore throat, laryngitis Cardiovascular: negative for chest pain or palpitations Respiratory: negative for hemoptysis, wheezing, shortness of breath, or cough Abdominal: negative for abdominal pain, nausea, vomiting, diarrhea, or constipation Dermatological: negative for rash Neurologic: negative for headache, dizziness, or syncope All other systems reviewed and are otherwise negative with the exception to those above and in the HPI.  Physical Exam: Chapman pressure 110/70, pulse 62, temperature 98.3 F (36.8 C), temperature source Oral, resp. rate 18, weight 177 lb 3.2 oz (80.377 kg), SpO2 95 %., Body mass index is 31.4 kg/(m^2). General: Well developed, well nourished, in no acute distress. Head: Normocephalic, atraumatic, eyes without discharge, sclera non-icteric, nares are without discharge. Bilateral auditory canals clear, TM's are without perforation, pearly grey and translucent with reflective cone of light bilaterally. Oral cavity moist, posterior pharynx without exudate, erythema, peritonsillar abscess, or post nasal drip.  Neck: Supple. No thyromegaly. Full ROM. No lymphadenopathy. Lungs: Clear bilaterally to auscultation without wheezes. Breathing is unlabored. Rales and rhonchi bilaterally Heart: RRR with S1 S2. No murmurs, rubs, or gallops appreciated. Abdomen: Soft, non-tender, non-distended with normoactive bowel sounds. No hepatomegaly. No rebound/guarding. No obvious abdominal masses. Msk:  Strength and tone normal for age. Extremities/Skin: Warm and dry. No clubbing or cyanosis. No edema. No  rashes or suspicious lesions. Neuro: Alert and oriented X 3. Moves all extremities spontaneously. Gait is normal. CNII-XII grossly in tact. Psych:  Responds to questions appropriately with a normal affect.    ASSESSMENT AND PLAN:  45 y.o. year old female with  This chart was scribed in my presence and reviewed by me personally.    ICD-9-CM ICD-10-CM   1. Acute upper respiratory infection 465.9 J06.9 amoxicillin (AMOXIL) 500 MG capsule      Signed, Robyn Haber, MD 04/06/2015 3:09 PM

## 2015-04-23 DIAGNOSIS — F250 Schizoaffective disorder, bipolar type: Secondary | ICD-10-CM | POA: Diagnosis not present

## 2015-04-27 DIAGNOSIS — F250 Schizoaffective disorder, bipolar type: Secondary | ICD-10-CM | POA: Diagnosis not present

## 2015-04-27 DIAGNOSIS — F3130 Bipolar disorder, current episode depressed, mild or moderate severity, unspecified: Secondary | ICD-10-CM | POA: Diagnosis not present

## 2015-04-29 ENCOUNTER — Encounter: Payer: Medicare Other | Attending: Physical Medicine & Rehabilitation | Admitting: Physical Medicine & Rehabilitation

## 2015-04-29 ENCOUNTER — Encounter: Payer: Self-pay | Admitting: Physical Medicine & Rehabilitation

## 2015-04-29 VITALS — BP 93/63 | HR 64 | Resp 14

## 2015-04-29 DIAGNOSIS — G43709 Chronic migraine without aura, not intractable, without status migrainosus: Secondary | ICD-10-CM | POA: Diagnosis not present

## 2015-04-29 DIAGNOSIS — R51 Headache: Secondary | ICD-10-CM | POA: Diagnosis not present

## 2015-04-29 DIAGNOSIS — IMO0002 Reserved for concepts with insufficient information to code with codable children: Secondary | ICD-10-CM

## 2015-04-29 NOTE — Progress Notes (Signed)
Botox Injection for chronic migraine headaches ICD 10: G43.709   Dilution: 100 Units/ 61ml preservative free NS x2 Indication: refractory headaches (At least 15 days per month/headache lasting greater than 4 hours per day) incompletely responsive to other more conservative measures.  Informed consent was obtained after describing risks and benefits of the procedure with the patient. This includes bleeding, bruising, infection, excessive weakness, or medication side effects. A REMS form is on file and signed. Needle: 30g 1/2 inch needle  Injection #2, last injection 02/04/15.  Number of units per muscle:  Right temporalis 20 units, 4 access points Left temporalis 20 units,  4 access points Right frontalis 10 units, 2 access points Left frontalis 10 units, 2 access points Procerus 5 units, 1 access point Right corrugator 5 units, 1 access point Left corrugator 5 units, 1 access point Right occipitalis 15 units, 3 access points Left occipitalis 15 units, 3 access points Right cervical paraspinal 10 units, 2 access points Left cervical paraspinals 10 units, 2 access points  Right trapezius 15 units, 3 access points Left trapezius 15 units, 3 access points   Remaining 45 units was discarded.  All injections were done after  after negative drawback for blood. The patient tolerated the procedure well. Post procedure instructions were given. A followup appointment was made.

## 2015-05-19 DIAGNOSIS — F315 Bipolar disorder, current episode depressed, severe, with psychotic features: Secondary | ICD-10-CM | POA: Diagnosis not present

## 2015-05-19 DIAGNOSIS — F250 Schizoaffective disorder, bipolar type: Secondary | ICD-10-CM | POA: Diagnosis not present

## 2015-06-03 DIAGNOSIS — R112 Nausea with vomiting, unspecified: Secondary | ICD-10-CM | POA: Diagnosis not present

## 2015-06-03 DIAGNOSIS — H53149 Visual discomfort, unspecified: Secondary | ICD-10-CM | POA: Diagnosis not present

## 2015-06-03 DIAGNOSIS — F17210 Nicotine dependence, cigarettes, uncomplicated: Secondary | ICD-10-CM | POA: Diagnosis not present

## 2015-06-03 DIAGNOSIS — F259 Schizoaffective disorder, unspecified: Secondary | ICD-10-CM | POA: Diagnosis not present

## 2015-06-03 DIAGNOSIS — Z79899 Other long term (current) drug therapy: Secondary | ICD-10-CM | POA: Diagnosis not present

## 2015-06-03 DIAGNOSIS — R51 Headache: Secondary | ICD-10-CM | POA: Diagnosis not present

## 2015-06-10 ENCOUNTER — Encounter: Payer: Self-pay | Admitting: Physical Medicine & Rehabilitation

## 2015-06-10 ENCOUNTER — Encounter: Payer: Medicare Other | Attending: Physical Medicine & Rehabilitation | Admitting: Physical Medicine & Rehabilitation

## 2015-06-10 ENCOUNTER — Ambulatory Visit: Payer: Medicare Other | Admitting: Physical Medicine & Rehabilitation

## 2015-06-10 VITALS — BP 85/55 | HR 56 | Resp 14

## 2015-06-10 DIAGNOSIS — IMO0002 Reserved for concepts with insufficient information to code with codable children: Secondary | ICD-10-CM

## 2015-06-10 DIAGNOSIS — G43709 Chronic migraine without aura, not intractable, without status migrainosus: Secondary | ICD-10-CM | POA: Diagnosis not present

## 2015-06-10 DIAGNOSIS — R51 Headache: Secondary | ICD-10-CM | POA: Insufficient documentation

## 2015-06-10 NOTE — Progress Notes (Signed)
Subjective:    Patient ID: ALFONSO SKY, female    DOB: [birth year], 45 y.o.   MRN: [id number]  HPI  45 y/o female presents for follow up after Botox for migraines. This was her Second botox injection for migraines. She notes improvement in migraines. She is down to 5-10 migraines/month, from daily migraines. Prior to injection, the migraines affected her vision and hearing, but now, do not appear to do so. Prior to Botox injections the severity of her injections were 7-8/10 and now they are 3-4/10 in severity.   Pain Inventory Average Pain 6 Pain Right Now 3 My pain is sharp, dull and stabbing  In the last 24 hours, has pain interfered with the following? General activity 0 Relation with others 0 Enjoyment of life 0 What TIME of day is your pain at its worst? NA Sleep (in general) Fair  Pain is worse with: bending Pain improves with: medication Relief from Meds: 4  Mobility Do you have any goals in this area?  no  Function not employed: date last employed NA  Neuro/Psych confusion depression anxiety  Prior Studies Any changes since last visit?  no  Physicians involved in your care Any changes since last visit?  no   Family History  Problem Relation Age of Onset  . Alcohol abuse Father   . Crohn's disease Sister   . Stroke Brother 45  . Migraines Daughter   . GER disease Daughter   . Migraines Daughter   . GER disease Daughter   . Depression Mother    Social History   Social History  . Marital Status: Divorced    Spouse Name: n/a  . Number of Children: 2  . Years of Education: 18   Occupational History  . Disabled     Formerly a Pharmacist, hospital.   Social History Main Topics  . Smoking status: Current Every Day Smoker -- 0.25 packs/day for 33 years    Types: Cigarettes    Start date: 10/10/1981  . Smokeless tobacco: Never Used  . Alcohol Use: 0.0 oz/week    0 Standard drinks or equivalent per week     Comment: occasionally  . Drug Use: No  .  Sexual Activity:    Partners: Male    Birth Control/ Protection: None   Other Topics Concern  . None   Social History Narrative   Lives at home with her daughter.   Right-handed.   2-4 cups caffeine daily.   Past Surgical History  Procedure Laterality Date  . Cholecystectomy    . Carpal tunnel release    . Ganglion cyst excision      RIGHT wrist   Past Medical History  Diagnosis Date  . Anxiety   . Depression   . Bipolar affective psychosis ([place])   . GERD (gastroesophageal reflux disease)   . Migraine    BP 85/55 mmHg  Pulse 56  Resp 14  SpO2 96%  Opioid Risk Score:   Fall Risk Score:  `1  Depression screen PHQ 2/9  Depression screen [facility] 2/9 04/29/2015 04/06/2015 03/22/2015 02/04/2015  Decreased Interest 2 0 0 0  Down, Depressed, Hopeless 3 0 0 0  PHQ - 2 Score 5 0 0 0  Altered sleeping 1 - - -  Tired, decreased energy 1 - - -  Change in appetite 0 - - -  Feeling bad or failure about yourself  3 - - -  Trouble concentrating 2 - - -  Moving slowly or fidgety/restless 0 - - -  Suicidal thoughts 2 - - -  PHQ-9 Score 14 - - -  Difficult doing work/chores Somewhat difficult - - -       Review of Systems  Gastrointestinal: Positive for nausea, vomiting and diarrhea.  Psychiatric/Behavioral: Positive for confusion.       Depression Anxiety  All other systems reviewed and are negative.      Objective:   Physical Exam HENT: Normocephalic, Atraumatic Eyes: EOMI, Conj WNL Cardio: S1, S2 normal, RRR Pulm: B/l clear to auscultation. Effort normal Abd: Soft, non-distended, non-tender, BS+ MSK: Gait WNL.  No edema. Neuro: Sensation intact to light touch along face, expected decrease in strength and frontalis muscles  Ovid Curd is appeared droopy, but she states that his always how she has been in she has a habit of squinting but does not feel that she has difficulty lifting her eyelids Skin: Warm and Dry. No rashes.    Assessment & Plan:  45 y/o  female presents for follow up after Botox for migraines.  1. Chronic migraines Since the age of 68 1st injection on 02/04/15, Second injection on 04/29/15 with improvement in frequency, intensity, and aura, no side effects (except for limited ability to raise eyebrows-minor inconvenience for pt) Patient notes that she experiences more pain along the left temporal area, will units to that area   Lindsay plan to increasel plan for repeat injection in 6 weeks  RTC 6 weeks for repeat botox injection

## 2015-06-16 DIAGNOSIS — F250 Schizoaffective disorder, bipolar type: Secondary | ICD-10-CM | POA: Diagnosis not present

## 2015-07-07 DIAGNOSIS — F250 Schizoaffective disorder, bipolar type: Secondary | ICD-10-CM | POA: Diagnosis not present

## 2015-07-07 DIAGNOSIS — F3130 Bipolar disorder, current episode depressed, mild or moderate severity, unspecified: Secondary | ICD-10-CM | POA: Diagnosis not present

## 2015-07-22 ENCOUNTER — Encounter: Payer: Self-pay | Admitting: Physical Medicine & Rehabilitation

## 2015-07-22 ENCOUNTER — Encounter: Payer: Medicare Other | Attending: Physical Medicine & Rehabilitation | Admitting: Physical Medicine & Rehabilitation

## 2015-07-22 VITALS — BP 74/56 | HR 61 | Resp 17

## 2015-07-22 DIAGNOSIS — R51 Headache: Secondary | ICD-10-CM | POA: Insufficient documentation

## 2015-07-22 DIAGNOSIS — IMO0002 Reserved for concepts with insufficient information to code with codable children: Secondary | ICD-10-CM

## 2015-07-22 DIAGNOSIS — G43709 Chronic migraine without aura, not intractable, without status migrainosus: Secondary | ICD-10-CM | POA: Diagnosis not present

## 2015-07-22 NOTE — Progress Notes (Signed)
Botox Injection for chronic migraine headaches ICD 10: G43.709   Dilution: 100 Units/ 74ml preservative free NS x2 Indication: refractory headaches (At least 15 days per month/headache lasting greater than 4 hours per day) incompletely responsive to other more conservative measures.  Informed consent was obtained after describing risks and benefits of the procedure with the patient. This includes bleeding, bruising, infection, excessive weakness, or medication side effects. A REMS form is on file and signed. Needle: 30g 1/2 inch needle  Injection #3, last injection 04/29/15.  Number of units per muscle:  Right temporalis 20 units, 4 access points Left temporalis 40 units,  4 access points Right frontalis 10 units, 2 access points Left frontalis 10 units, 2 access points Procerus 5 units, 1 access point Right corrugator 5 units, 1 access point Left corrugator 5 units, 1 access point Right occipitalis 15 units, 3 access points Left occipitalis 15 units, 3 access points Right cervical paraspinal 10 units, 2 access points Left cervical paraspinals 10 units, 2 access points  Right trapezius 15 units, 3 access points Left trapezius 15 units, 3 access points   (Increased Left temporalis units from 20/4 to 40/4)  Remaining 25 units were discarded.  All injections were done after  after negative drawback for blood. The patient tolerated the procedure well. Post procedure instructions were given. A followup appointment was made.  RTC 3 months

## 2015-07-26 DIAGNOSIS — F250 Schizoaffective disorder, bipolar type: Secondary | ICD-10-CM | POA: Diagnosis not present

## 2015-08-17 ENCOUNTER — Ambulatory Visit (INDEPENDENT_AMBULATORY_CARE_PROVIDER_SITE_OTHER): Payer: Medicare Other

## 2015-08-17 ENCOUNTER — Ambulatory Visit (INDEPENDENT_AMBULATORY_CARE_PROVIDER_SITE_OTHER): Payer: Medicare Other | Admitting: Urgent Care

## 2015-08-17 VITALS — BP 110/72 | HR 60 | Temp 97.6°F | Resp 18 | Ht 63.0 in | Wt 158.0 lb

## 2015-08-17 DIAGNOSIS — S93401A Sprain of unspecified ligament of right ankle, initial encounter: Secondary | ICD-10-CM | POA: Diagnosis not present

## 2015-08-17 DIAGNOSIS — W19XXXA Unspecified fall, initial encounter: Secondary | ICD-10-CM

## 2015-08-17 DIAGNOSIS — S99911A Unspecified injury of right ankle, initial encounter: Secondary | ICD-10-CM | POA: Diagnosis not present

## 2015-08-17 DIAGNOSIS — M25571 Pain in right ankle and joints of right foot: Secondary | ICD-10-CM

## 2015-08-17 DIAGNOSIS — M7989 Other specified soft tissue disorders: Secondary | ICD-10-CM | POA: Diagnosis not present

## 2015-08-17 DIAGNOSIS — I951 Orthostatic hypotension: Secondary | ICD-10-CM

## 2015-08-17 DIAGNOSIS — M25561 Pain in right knee: Secondary | ICD-10-CM | POA: Diagnosis not present

## 2015-08-17 DIAGNOSIS — M25471 Effusion, right ankle: Secondary | ICD-10-CM

## 2015-08-17 LAB — COMPREHENSIVE METABOLIC PANEL
ALBUMIN: 4.3 g/dL (ref 3.6–5.1)
ALT: 10 U/L (ref 6–29)
AST: 12 U/L (ref 10–35)
Alkaline Phosphatase: 60 U/L (ref 33–115)
BILIRUBIN TOTAL: 0.3 mg/dL (ref 0.2–1.2)
BUN: 10 mg/dL (ref 7–25)
CALCIUM: 9.1 mg/dL (ref 8.6–10.2)
CHLORIDE: 106 mmol/L (ref 98–110)
CO2: 27 mmol/L (ref 20–31)
Creat: 1.12 mg/dL — ABNORMAL HIGH (ref 0.50–1.10)
GLUCOSE: 113 mg/dL — AB (ref 65–99)
POTASSIUM: 4.7 mmol/L (ref 3.5–5.3)
Sodium: 140 mmol/L (ref 135–146)
Total Protein: 6.5 g/dL (ref 6.1–8.1)

## 2015-08-17 LAB — CBC
HEMATOCRIT: 39 % (ref 35.0–45.0)
HEMOGLOBIN: 13.5 g/dL (ref 11.7–15.5)
MCH: 31.6 pg (ref 27.0–33.0)
MCHC: 34.6 g/dL (ref 32.0–36.0)
MCV: 91.3 fL (ref 80.0–100.0)
MPV: 10.7 fL (ref 7.5–12.5)
Platelets: 234 10*3/uL (ref 140–400)
RBC: 4.27 MIL/uL (ref 3.80–5.10)
RDW: 13 % (ref 11.0–15.0)
WBC: 8.8 10*3/uL (ref 3.8–10.8)

## 2015-08-17 MED ORDER — NAPROXEN SODIUM 550 MG PO TABS: 550.0000 mg | ORAL_TABLET | Freq: Two times a day (BID) | ORAL | 1 refills | Status: DC

## 2015-08-17 NOTE — Progress Notes (Signed)
MRN: [id number] DOB: 1970/07/18  Subjective:   Lauren Chapman is a 45 y.o. female presenting for chief complaint of Ankle Injury (Fall, x5days)  Reports having suffered a fall ~1 week ago. States that she got out of bed too quickly and passed out. She has since had right knee pain, swelling, knee buckling, ankle pain and swelling. The ankle pain has been improving, she is able to bear weight, has been using ibuprofen. Also reports a history of occasional syncope when she sits up or stands up too quickly. Does not always hydrate very well. She also takes propranolol 60mg  twice daily. Denies active chest pain, dizziness, heart racing, palpitations, headache, confusion.  Molly has a current medication list which includes the following prescription(s): benztropine, diazepam, esomeprazole, fluticasone, gabapentin, ibuprofen, lamotrigine, naproxen, perphenazine, promethazine, propranolol, risperidone, sumatriptan, and vortioxetine hbr. Also is allergic to codeine.  Tenecia  has a past medical history of Anxiety; Bipolar affective psychosis ([place]); Depression; GERD (gastroesophageal reflux disease); and Migraine. Also  has a past surgical history that includes Cholecystectomy; Carpal tunnel release; and Ganglion cyst excision.  Objective:   Vitals: BP 110/72 (BP Location: Right Arm, Patient Position: Sitting, Cuff Size: Normal)   Pulse 60   Temp 97.6 F (36.4 C) (Oral)   Resp 18   Ht 5\' 3"  (1.6 m)   Wt 158 lb (71.7 kg)   SpO2 98%   BMI 27.99 kg/m   Physical Exam  Constitutional: She is oriented to person, place, and time. She appears well-developed and well-nourished.  HENT:  Mouth/Throat: Oropharynx is clear and moist.  Eyes: Pupils are equal, round, and reactive to light.  Neck: Normal range of motion. Neck supple.  Cardiovascular: Normal rate, regular rhythm and intact distal pulses.  Exam reveals no gallop and no friction rub.   No murmur heard. Pulmonary/Chest: Effort normal. No  respiratory distress. She has no wheezes. She has no rales.  Musculoskeletal:       Right knee: She exhibits decreased range of motion (flexion) and swelling (trace). She exhibits no effusion, no ecchymosis, no deformity, no laceration, no erythema, normal alignment, no LCL laxity, normal patellar mobility, no bony tenderness, normal meniscus and no MCL laxity. Tenderness found. Medial joint line (positive McMurray test), lateral joint line and patellar tendon tenderness noted.       Right ankle: She exhibits swelling and ecchymosis (slight over lateral ankle). She exhibits normal range of motion, no deformity, no laceration and normal pulse. Tenderness. AITFL tenderness found. No lateral malleolus, no medial malleolus, no CF ligament, no posterior TFL, no head of 5th metatarsal and no proximal fibula tenderness found. Achilles tendon exhibits no pain and no defect.  Neurological: She is alert and oriented to person, place, and time. She has normal reflexes.  Skin: Skin is warm and dry.   Dg Ankle Complete Right  Result Date: 08/17/2015 CLINICAL DATA:  Right ankle injury from fall 1 week ago. Initial encounter. EXAM: RIGHT ANKLE - COMPLETE 3+ VIEW COMPARISON:  None. FINDINGS: There is no evidence of fracture, dislocation, or joint effusion. There is no evidence of arthropathy or other focal bone abnormality. Soft tissues are unremarkable. IMPRESSION: Negative. Electronically Signed   By: Misty Stanley M.D.   On: 08/17/2015 16:39  Dg Knee Complete 4 Views Right  Result Date: 08/17/2015 CLINICAL DATA:  Knee pain and swelling from a fall 1 week ago. Initial encounter. EXAM: RIGHT KNEE - COMPLETE 4+ VIEW COMPARISON:  None. FINDINGS: No evidence of fracture, dislocation, or joint  effusion. No evidence of arthropathy or other focal bone abnormality. Soft tissues are unremarkable. IMPRESSION: Negative. Electronically Signed   By: Misty Stanley M.D.   On: 08/17/2015 16:40  ECG interpretation - sinus  bradycardia.   Assessment and Plan :   1. Orthostatic hypotension 2. Fall, initial encounter - Take 1/2 tablet of propranolol twice daily as I suspect this is the major source of her hypotension and bradycardia. She will check back with her neurologist to review using propranolol going forward. She will try to hydrate consistently. RTC in 1 week for recheck. - Patient declined blood work that is not covered by her insurance.   3. Right knee pain 4. Ankle sprain, right, initial encounter 5. Right ankle pain 6. Right ankle swelling - Referral to ortho  for evaluation of possible torn meniscus. In the meantime, wear Sweedo, use RICE method, Anaprox for pain and inflammation.  [name], PA-C [facility] [phone] 08/17/2015 3:39 PM

## 2015-08-17 NOTE — Patient Instructions (Addendum)
Orthostatic Hypotension Orthostatic hypotension is a sudden drop in blood pressure. It happens when you quickly stand up from a seated or lying position. You may feel dizzy or light-headed. This can last for just a few seconds or for up to a few minutes. It is usually not a serious problem. However, if this happens frequently or gets worse, it can be a sign of something more serious. CAUSES  Different things can cause orthostatic hypotension, including:   Loss of body fluids (dehydration).  Medicines that lower blood pressure.  Sudden changes in posture, such as standing up quickly after you have been sitting or lying down.  Taking too much of your medicine. SIGNS AND SYMPTOMS   Light-headedness or dizziness.   Fainting or near-fainting.   A fast heart rate.   Weakness.   Feeling tired (fatigue).  DIAGNOSIS  Your health care provider may do several things to help diagnose your condition and identify the cause. These may include:   Taking a medical history and doing a physical exam.  Checking your blood pressure. Your health care provider will check your blood pressure when you are:  Lying down.  Sitting.  Standing.  Using tilt table testing. In this test, you lie down on a table that moves from a lying position to a standing position. You will be strapped onto the table. This test monitors your blood pressure and heart rate when you are in different positions. TREATMENT  Treatment will vary depending on the cause. Possible treatments include:   Changing the dosage of your medicines.  Wearing compression stockings on your lower legs.  Standing up slowly after sitting or lying down.  Eating more salt.  Eating frequent, small meals.  In some cases, getting IV fluids.  Taking medicine to enhance fluid retention. HOME CARE INSTRUCTIONS  Only take over-the-counter or prescription medicines as directed by your health care provider.  Follow your health care  provider's instructions for changing the dosage of your current medicines.  Do not stop or adjust your medicine on your own.  Stand up slowly after sitting or lying down. This allows your body to adjust to the different position.  Wear compression stockings as directed.  Eat extra salt as directed.  Do not add extra salt to your diet unless directed to by your health care provider.  Eat frequent, small meals.  Avoid standing suddenly after eating.  Avoid hot showers or excessive heat as directed by your health care provider.  Keep all follow-up appointments. SEEK MEDICAL CARE IF:  You continue to feel dizzy or light-headed after standing.  You feel groggy or confused.  You feel cold, clammy, or sick to your stomach (nauseous).  You have blurred vision.  You feel short of breath. SEEK IMMEDIATE MEDICAL CARE IF:   You faint after standing.  You have chest pain.  You have difficulty breathing.   You lose feeling or movement in your arms or legs.   You have slurred speech or difficulty talking, or you are unable to talk.  MAKE SURE YOU:   Understand these instructions.  Will watch your condition.  Will get help right away if you are not doing well or get worse.   This information is not intended to replace advice given to you by your health care provider. Make sure you discuss any questions you have with your health care provider.   Document Released: 12/30/2001 Document Revised: 01/14/2013 Document Reviewed: 11/01/2012 Elsevier Interactive Patient Education [employer].  RICE for Routine Care of Injuries Theroutine careofmanyinjuriesincludes rest, ice, compression, and elevation (RICE therapy). RICE therapy is often recommended for injuries to soft tissues, such as a muscle strain, ligament injuries, bruises, and overuse injuries. It can also be used for some bony injuries. Using RICE therapy can help to relieve pain, lessen swelling, and  enable your body to heal. Rest Rest is required to allow your body to heal. This usually involves reducing your normal activities and avoiding use of the injured part of your body. Generally, you can return to your normal activities when you are comfortable and have been given permission by your health care provider. Ice Icing your injury helps to keep the swelling down, and it lessens pain. Do not apply ice directly to your skin.  Put ice in a plastic bag.  Place a towel between your skin and the bag.  Leave the ice on for 20 minutes, 2-3 times a day. Do this for as long as you are directed by your health care provider. Compression Compression means putting pressure on the injured area. Compression helps to keep swelling down, gives support, and helps with discomfort. Compression may be done with an elastic bandage. If an elastic bandage has been applied, follow these general tips:  Remove and reapply the bandage every 3-4 hours or as directed by your health care provider.  Make sure the bandage is not wrapped too tightly, because this can cut off circulation. If part of your body beyond the bandage becomes blue, numb, cold, swollen, or more painful, your bandage is most likely too tight. If this occurs, remove your bandage and reapply it more loosely.  See your health care provider if the bandage seems to be making your problems worse rather than better. Elevation Elevation means keeping the injured area raised. This helps to lessen swelling and decrease pain. If possible, your injured area should be elevated at or above the level of your heart or the center of your chest. [place]? You should seek medical care if:  Your pain and swelling continue.  Your symptoms are getting worse rather than improving. These symptoms may indicate that further evaluation or further X-rays are needed. Sometimes, X-rays may not show a small broken bone (fracture) until a number of  days later. Make a follow-up appointment with your health care provider. WHEN SHOULD I SEEK IMMEDIATE MEDICAL CARE? You should seek immediate medical care if:  You have sudden severe pain at or below the area of your injury.  You have redness or increased swelling around your injury.  You have tingling or numbness at or below the area of your injury that does not improve after you remove the elastic bandage.   This information is not intended to replace advice given to you by your health care provider. Make sure you discuss any questions you have with your health care provider.   Document Released: 04/23/2000 Document Revised: 09/30/2014 Document Reviewed: 12/17/2013 Elsevier Interactive Patient Education 2016 [employer].     IF you received an x-ray today, you will receive an invoice from [facility]. Please contact [facility] at [phone] with questions or concerns regarding your invoice.   IF you received labwork today, you will receive an invoice from [employer]. Please contact [facility] at [phone] with questions or concerns regarding your invoice.   Our billing staff will not be able to assist you with questions regarding bills from these companies.  You will be contacted  with the lab results as soon as they are available. The fastest way to get your results is to activate your My Chart account. Instructions are located on the last page of this paperwork. If you have not heard from [place] regarding the results in 2 weeks, please contact this office.

## 2015-08-18 DIAGNOSIS — F250 Schizoaffective disorder, bipolar type: Secondary | ICD-10-CM | POA: Diagnosis not present

## 2015-08-20 ENCOUNTER — Encounter: Payer: Self-pay | Admitting: Urgent Care

## 2015-08-23 ENCOUNTER — Ambulatory Visit (INDEPENDENT_AMBULATORY_CARE_PROVIDER_SITE_OTHER): Payer: Medicare Other | Admitting: Urgent Care

## 2015-08-23 VITALS — BP 102/60 | HR 67 | Temp 98.2°F | Resp 16 | Ht 62.6 in | Wt 158.0 lb

## 2015-08-23 DIAGNOSIS — M25561 Pain in right knee: Secondary | ICD-10-CM | POA: Diagnosis not present

## 2015-08-23 DIAGNOSIS — I951 Orthostatic hypotension: Secondary | ICD-10-CM | POA: Diagnosis not present

## 2015-08-23 DIAGNOSIS — R55 Syncope and collapse: Secondary | ICD-10-CM | POA: Diagnosis not present

## 2015-08-23 DIAGNOSIS — S93401A Sprain of unspecified ligament of right ankle, initial encounter: Secondary | ICD-10-CM

## 2015-08-23 DIAGNOSIS — M25471 Effusion, right ankle: Secondary | ICD-10-CM | POA: Diagnosis not present

## 2015-08-23 NOTE — Progress Notes (Signed)
    MRN: [id number] DOB: 11-Jul-1970  Subjective:   Lauren Chapman is a 45 y.o. female presenting for follow up on orthostatic hypotension, fall, Follow-up (right leg injury)  Reports that she has cut her dosage of propranolol by half. She had headaches the first couple of days but this has since resolved. Reports that her dizziness has improved very much. Denies syncope. She still has right knee buckling but her pain has improved. She has been wearing her ankle brace and still has pain despite using RICE. She has not been taking ibuprofen, is using Anaprox twice daily. Has not been referred to ortho. Her f/u with Dr. Posey Pronto for management of her migraines is in 09/2015.  Lauren Chapman has a current medication list which includes the following prescription(s): benztropine, diazepam, esomeprazole, gabapentin, ibuprofen, lamotrigine, naproxen sodium, perphenazine, promethazine, propranolol, risperidone, sumatriptan, fluticasone, naproxen, and vortioxetine hbr. Also is allergic to codeine.  Lauren Chapman  has a past medical history of Anxiety; Bipolar affective psychosis ([place]); Depression; GERD (gastroesophageal reflux disease); and Migraine. Also  has a past surgical history that includes Cholecystectomy; Carpal tunnel release; and Ganglion cyst excision.  Objective:   Vitals: BP 102/60 (BP Location: Right Arm, Patient Position: Sitting, Cuff Size: Normal)   Pulse 67   Temp 98.2 F (36.8 C)   Resp 16   Ht 5' 2.6" (1.59 m)   Wt 158 lb (71.7 kg)   SpO2 97%   BMI 28.35 kg/m   BP Readings from Last 3 Encounters:  08/23/15 102/60  08/17/15 110/72  07/22/15 (!) 74/56   Orthostatic VS for the past 24 hrs:  BP- Lying Pulse- Lying BP- Sitting Pulse- Sitting BP- Standing at 0 minutes Pulse- Standing at 0 minutes  08/23/15 1834 107/73 58 105/71 64 90/53 66   Physical Exam  Constitutional: She is oriented to person, place, and time. She appears well-developed and well-nourished.  HENT:  Mouth/Throat:  Oropharynx is clear and moist.  Eyes: Pupils are equal, round, and reactive to light. No scleral icterus.  Cardiovascular: Normal rate, regular rhythm and intact distal pulses.  Exam reveals no gallop and no friction rub.   No murmur heard. Pulmonary/Chest: No respiratory distress. She has no wheezes. She has no rales.  Musculoskeletal:       Right ankle: She exhibits swelling. She exhibits normal range of motion, no ecchymosis, no deformity, no laceration and normal pulse. Tenderness. Lateral malleolus and AITFL tenderness found. No medial malleolus, no CF ligament, no posterior TFL, no head of 5th metatarsal and no proximal fibula tenderness found. Achilles tendon exhibits no pain and no defect.  Neurological: She is alert and oriented to person, place, and time.  Skin: Skin is warm and dry.   Assessment and Plan :   1. Orthostatic hypotension 2. Syncope and collapse - Improved, I suspect that propranolol was the source of her orthostatic hypotension. She is to follow up with Dr. Posey Pronto for medication management and recheck of her propranolol.  3. Right knee pain 4. Ankle sprain, right, initial encounter 5. Ankle swelling, right - Improved but I will request that she be scheduled with ortho this week. Continue Anaprox, start ankle rehab. She will call if she does not get scheduled but will otherwise be managed by them.  [name], PA-C [facility] [phone] 08/23/2015 6:01 PM

## 2015-08-23 NOTE — Patient Instructions (Addendum)
[facility] [phone]  [place], [place] [address]    IF you received an x-ray today, you will receive an invoice from [facility]. Please contact [facility] at [phone] with questions or concerns regarding your invoice.   IF you received labwork today, you will receive an invoice from [employer]. Please contact [facility] at [phone] with questions or concerns regarding your invoice.   Our billing staff will not be able to assist you with questions regarding bills from these companies.  You will be contacted with the lab results as soon as they are available. The fastest way to get your results is to activate your My Chart account. Instructions are located on the last page of this paperwork. If you have not heard from [place] regarding the results in 2 weeks, please contact this office.

## 2015-08-26 DIAGNOSIS — S93401A Sprain of unspecified ligament of right ankle, initial encounter: Secondary | ICD-10-CM | POA: Diagnosis not present

## 2015-08-26 DIAGNOSIS — M25561 Pain in right knee: Secondary | ICD-10-CM | POA: Diagnosis not present

## 2015-09-01 DIAGNOSIS — F3130 Bipolar disorder, current episode depressed, mild or moderate severity, unspecified: Secondary | ICD-10-CM | POA: Diagnosis not present

## 2015-09-01 DIAGNOSIS — F250 Schizoaffective disorder, bipolar type: Secondary | ICD-10-CM | POA: Diagnosis not present

## 2015-09-07 DIAGNOSIS — M25561 Pain in right knee: Secondary | ICD-10-CM | POA: Diagnosis not present

## 2015-09-15 DIAGNOSIS — M25361 Other instability, right knee: Secondary | ICD-10-CM | POA: Diagnosis not present

## 2015-09-15 DIAGNOSIS — S83511A Sprain of anterior cruciate ligament of right knee, initial encounter: Secondary | ICD-10-CM | POA: Diagnosis not present

## 2015-09-21 DIAGNOSIS — M25561 Pain in right knee: Secondary | ICD-10-CM | POA: Diagnosis not present

## 2015-09-21 DIAGNOSIS — S83511D Sprain of anterior cruciate ligament of right knee, subsequent encounter: Secondary | ICD-10-CM | POA: Diagnosis not present

## 2015-09-22 DIAGNOSIS — F250 Schizoaffective disorder, bipolar type: Secondary | ICD-10-CM | POA: Diagnosis not present

## 2015-09-23 DIAGNOSIS — M25561 Pain in right knee: Secondary | ICD-10-CM | POA: Diagnosis not present

## 2015-09-23 DIAGNOSIS — S83511D Sprain of anterior cruciate ligament of right knee, subsequent encounter: Secondary | ICD-10-CM | POA: Diagnosis not present

## 2015-09-27 ENCOUNTER — Emergency Department (HOSPITAL_COMMUNITY)
Admission: EM | Admit: 2015-09-27 | Discharge: 2015-09-27 | Disposition: A | Discharge status: Home / Self Care | Payer: Medicare Other | Attending: Emergency Medicine | Admitting: Emergency Medicine

## 2015-09-27 ENCOUNTER — Encounter (HOSPITAL_COMMUNITY): Payer: Self-pay | Admitting: Emergency Medicine

## 2015-09-27 DIAGNOSIS — Z79899 Other long term (current) drug therapy: Secondary | ICD-10-CM | POA: Insufficient documentation

## 2015-09-27 DIAGNOSIS — G43909 Migraine, unspecified, not intractable, without status migrainosus: Secondary | ICD-10-CM | POA: Diagnosis not present

## 2015-09-27 DIAGNOSIS — Z7982 Long term (current) use of aspirin: Secondary | ICD-10-CM | POA: Diagnosis not present

## 2015-09-27 DIAGNOSIS — F17210 Nicotine dependence, cigarettes, uncomplicated: Secondary | ICD-10-CM | POA: Diagnosis not present

## 2015-09-27 DIAGNOSIS — G43009 Migraine without aura, not intractable, without status migrainosus: Secondary | ICD-10-CM

## 2015-09-27 MED ORDER — SODIUM CHLORIDE 0.9 % IV BOLUS (SEPSIS)
1000.0000 mL | Freq: Once | INTRAVENOUS | Status: AC
  Administered 2015-09-27: 1000 mL via INTRAVENOUS

## 2015-09-27 MED ORDER — PROCHLORPERAZINE EDISYLATE 5 MG/ML IJ SOLN
10.0000 mg | Freq: Once | INTRAMUSCULAR | Status: AC
  Administered 2015-09-27: 10 mg via INTRAVENOUS
  Filled 2015-09-27: qty 2

## 2015-09-27 MED ORDER — KETOROLAC TROMETHAMINE 30 MG/ML IJ SOLN
30.0000 mg | Freq: Once | INTRAMUSCULAR | Status: AC
  Administered 2015-09-27: 30 mg via INTRAVENOUS
  Filled 2015-09-27: qty 1

## 2015-09-27 MED ORDER — DIPHENHYDRAMINE HCL 50 MG/ML IJ SOLN
25.0000 mg | Freq: Once | INTRAMUSCULAR | Status: AC
  Administered 2015-09-27: 25 mg via INTRAVENOUS
  Filled 2015-09-27: qty 1

## 2015-09-27 NOTE — ED Triage Notes (Signed)
Per patient, she has a history of migraines.  Her headache started yesterday.  Her headache is all over frontal lobe.  She has blurred vision, n/v and pain.  Patient took Excedrin Migraine yesterday and today in addition took [facility] powers but no relief.  Patient took an injection a couple of days ago also for migraine and still no relief, Sumatren (not sure on spelling)  Denis LOC or falls.  No abdominal pain.

## 2015-09-27 NOTE — Discharge Instructions (Signed)
Return to the ED with any concerns including weakness in arms or legs, changes in vision or speech, vomiting and not able to keep down liquids, fainting, decrease level of alertness/lethargy, or any other alarming symptoms

## 2015-09-27 NOTE — ED Provider Notes (Signed)
[facility] DEPT Provider Note   CSN: [id number] Arrival date & time: 09/27/15  0906     History   Chief Complaint Chief Complaint  Patient presents with  . Migraine    HPI Lauren Chapman is a 45 y.o. female.  HPI  Pt presenting with c/o headache similar to her prior migraine headaches.  She states this headache started yesterday. HA is throbbing and frontal.  HA was gradual in onset.  She tried excedrin migraine, sumatriptan and goody powders which did not provide much relief.  No fever.  Has had some vomiting.  No focal weakness.  No changes in vision or speech.  There are no other associated systemic symptoms, there are no other alleviating or modifying factors.   Past Medical History:  Diagnosis Date  . Anxiety   . Bipolar affective psychosis ([place])   . Depression   . GERD (gastroesophageal reflux disease)   . Migraine     Patient Active Problem List   Diagnosis Date Noted  . Chronic migraine 12/22/2014  . Encounter for other general counseling or advice on contraception 09/02/2014  . Bipolar disorder ([place]) 10/12/2011  . Anxiety and depression 10/12/2011  . GERD (gastroesophageal reflux disease) 10/12/2011    Past Surgical History:  Procedure Laterality Date  . CARPAL TUNNEL RELEASE    . CHOLECYSTECTOMY    . GANGLION CYST EXCISION     RIGHT wrist    OB History    Gravida Para Term Preterm AB Living   3 2 2  0 1 2   SAB TAB Ectopic Multiple Live Births   1 0 0 0         Home Medications    Prior to Admission medications   Medication Sig Start Date End Date Taking? Authorizing Provider  benztropine (COGENTIN) 1 MG tablet Take 1 mg by mouth 2 (two) times daily.    Historical Provider, MD  diazepam (VALIUM) 5 MG tablet Take 5 mg by mouth 1 day or 1 dose.    Historical Provider, MD  esomeprazole (NEXIUM) 20 MG capsule Take 20 mg by mouth daily at 12 noon.    Historical Provider, MD  fluticasone (FLONASE) 50 MCG/ACT nasal spray Place 2 sprays into both  nostrils daily. Patient not taking: Reported on 08/23/2015 03/22/15   Wardell Honour, MD  gabapentin (NEURONTIN) 400 MG capsule Take 1,200 mg by mouth 4 (four) times daily. 01/13/15   Historical Provider, MD  ibuprofen (ADVIL,MOTRIN) 800 MG tablet Take 800 mg by mouth every 8 (eight) hours as needed.    Historical Provider, MD  lamoTRIgine (LAMICTAL) 150 MG tablet Take 150 mg by mouth daily.    Historical Provider, MD  naproxen (EC NAPROSYN) 500 MG EC tablet Take 1 tablet (500 mg total) by mouth 2 (two) times daily with a meal. Patient not taking: Reported on 08/23/2015 12/22/14   Marcial Pacas, MD  naproxen sodium (ANAPROX DS) 550 MG tablet Take 1 tablet (550 mg total) by mouth 2 (two) times daily with a meal. 08/17/15   [name], PA-C  perphenazine (TRILAFON) 16 MG tablet Take 32 mg by mouth at bedtime.    Historical Provider, MD  promethazine (PHENERGAN) 25 MG tablet Take 1 tablet (25 mg total) by mouth every 6 (six) hours as needed for nausea or vomiting. 12/22/14   Marcial Pacas, MD  propranolol (INDERAL) 60 MG tablet Take 1 tablet (60 mg total) by mouth 2 (two) times daily. 03/08/15   Marcial Pacas, MD  risperiDONE (RISPERDAL)  3 MG tablet Take 3 mg by mouth daily. 3 mg q am and 6 mg q pm 01/13/15   Historical Provider, MD  SUMAtriptan (IMITREX) 6 MG/0.5ML SOLN injection Inject 0.5 mLs (6 mg total) into the skin every 2 (two) hours as needed for migraine or headache. May repeat in 2 hours if headache persists or recurs. 03/08/15   Marcial Pacas, MD  Vortioxetine HBr (BRINTELLIX) 20 MG TABS Take by mouth daily.    Historical Provider, MD    Family History Family History  Problem Relation Age of Onset  . Depression Mother   . Alcohol abuse Father   . Crohn's disease Sister   . Stroke Brother 69  . Migraines Daughter   . GER disease Daughter   . Migraines Daughter   . GER disease Daughter     Social History Social History  Substance Use Topics  . Smoking status: Current Every Day Smoker    Packs/day:  0.25    Years: 33.00    Types: Cigarettes    Start date: 10/10/1981  . Smokeless tobacco: Never Used  . Alcohol use 0.0 oz/week     Comment: occasionally     Allergies   Codeine   Review of Systems Review of Systems  ROS reviewed and all otherwise negative except for mentioned in HPI   Physical Exam Updated Vital Signs BP 96/65 (BP Location: Left Arm)   Pulse 72   Temp 97.7 F (36.5 C) (Oral)   Resp 18   Ht 5\' 2"  (1.575 m)   Wt 160 lb (72.6 kg)   SpO2 100%   BMI 29.26 kg/m  Vitals reviewed Physical Exam Physical Examination: General appearance - alert, uncomfortable appearing, and in no distress Mental status - alert, oriented to person, place, and time Eyes - pupils equal and reactive, extraocular eye movements intact Mouth - mucous membranes moist, pharynx normal without lesions Neck - supple, no significant adenopathy Chest - clear to auscultation, no wheezes, rales or rhonchi, symmetric air entry Heart - normal rate, regular rhythm, normal S1, S2, no murmurs, rubs, clicks or gallops Abdomen - soft, nontender, nondistended, no masses or organomegaly Neurological - alert, oriented, normal speech, strength 5/5 in extremities x 4, sensastion intact, cranial nerves intact Extremities - peripheral pulses normal, no pedal edema, no clubbing or cyanosis Skin - normal coloration and turgor, no rashes  ED Treatments / Results  Labs (all labs ordered are listed, but only abnormal results are displayed) Labs Reviewed - No data to display  EKG  EKG Interpretation None       Radiology No results found.  Procedures Procedures (including critical care time)  Medications Ordered in ED Medications  prochlorperazine (COMPAZINE) injection 10 mg (not administered)  diphenhydrAMINE (BENADRYL) injection 25 mg (not administered)  ketorolac (TORADOL) 30 MG/ML injection 30 mg (not administered)  sodium chloride 0.9 % bolus 1,000 mL (not administered)     Initial  Impression / Assessment and Plan / ED Course  I have reviewed the triage vital signs and the nursing notes.  Pertinent labs & imaging results that were available during my care of the patient were reviewed by me and considered in my medical decision making (see chart for details).  Clinical Course  11:36 AM pt feeling much improved.    Pt presenting with migraine headache similar to her prior migraines.  Headache was gradual in onset, doubt SAH,  Normal neurologic exam.  No neck pain or fever, doubt meningitis.  Pt felt improved after migraine  cocktail.  Discharged with strict return precautions.  Pt agreeable with plan.  Final Clinical Impressions(s) / ED Diagnoses   Final diagnoses:  None    New Prescriptions New Prescriptions   No medications on file     Alfonzo Beers, MD 09/27/15 1241

## 2015-09-28 DIAGNOSIS — M25561 Pain in right knee: Secondary | ICD-10-CM | POA: Diagnosis not present

## 2015-09-28 DIAGNOSIS — S83511D Sprain of anterior cruciate ligament of right knee, subsequent encounter: Secondary | ICD-10-CM | POA: Diagnosis not present

## 2015-09-30 DIAGNOSIS — M25561 Pain in right knee: Secondary | ICD-10-CM | POA: Diagnosis not present

## 2015-09-30 DIAGNOSIS — S83511D Sprain of anterior cruciate ligament of right knee, subsequent encounter: Secondary | ICD-10-CM | POA: Diagnosis not present

## 2015-10-04 DIAGNOSIS — S83511D Sprain of anterior cruciate ligament of right knee, subsequent encounter: Secondary | ICD-10-CM | POA: Diagnosis not present

## 2015-10-04 DIAGNOSIS — M25561 Pain in right knee: Secondary | ICD-10-CM | POA: Diagnosis not present

## 2015-10-06 DIAGNOSIS — S83511D Sprain of anterior cruciate ligament of right knee, subsequent encounter: Secondary | ICD-10-CM | POA: Diagnosis not present

## 2015-10-06 DIAGNOSIS — M25561 Pain in right knee: Secondary | ICD-10-CM | POA: Diagnosis not present

## 2015-10-11 DIAGNOSIS — S83511D Sprain of anterior cruciate ligament of right knee, subsequent encounter: Secondary | ICD-10-CM | POA: Diagnosis not present

## 2015-10-11 DIAGNOSIS — M25561 Pain in right knee: Secondary | ICD-10-CM | POA: Diagnosis not present

## 2015-10-13 DIAGNOSIS — F250 Schizoaffective disorder, bipolar type: Secondary | ICD-10-CM | POA: Diagnosis not present

## 2015-10-14 DIAGNOSIS — S83511D Sprain of anterior cruciate ligament of right knee, subsequent encounter: Secondary | ICD-10-CM | POA: Diagnosis not present

## 2015-10-14 DIAGNOSIS — M25561 Pain in right knee: Secondary | ICD-10-CM | POA: Diagnosis not present

## 2015-10-18 DIAGNOSIS — S83511D Sprain of anterior cruciate ligament of right knee, subsequent encounter: Secondary | ICD-10-CM | POA: Diagnosis not present

## 2015-10-18 DIAGNOSIS — M25561 Pain in right knee: Secondary | ICD-10-CM | POA: Diagnosis not present

## 2015-10-20 DIAGNOSIS — M25561 Pain in right knee: Secondary | ICD-10-CM | POA: Diagnosis not present

## 2015-10-20 DIAGNOSIS — S83511D Sprain of anterior cruciate ligament of right knee, subsequent encounter: Secondary | ICD-10-CM | POA: Diagnosis not present

## 2015-10-22 ENCOUNTER — Encounter: Payer: Medicare Other | Admitting: Physical Medicine & Rehabilitation

## 2015-10-22 DIAGNOSIS — Z1231 Encounter for screening mammogram for malignant neoplasm of breast: Secondary | ICD-10-CM | POA: Diagnosis not present

## 2015-10-24 HISTORY — PX: KNEE ARTHROSCOPY WITH ANTERIOR CRUCIATE LIGAMENT (ACL) REPAIR: SHX5644

## 2015-10-25 DIAGNOSIS — S83511D Sprain of anterior cruciate ligament of right knee, subsequent encounter: Secondary | ICD-10-CM | POA: Diagnosis not present

## 2015-10-25 DIAGNOSIS — M25561 Pain in right knee: Secondary | ICD-10-CM | POA: Diagnosis not present

## 2015-10-27 DIAGNOSIS — S83511D Sprain of anterior cruciate ligament of right knee, subsequent encounter: Secondary | ICD-10-CM | POA: Diagnosis not present

## 2015-10-27 DIAGNOSIS — M25561 Pain in right knee: Secondary | ICD-10-CM | POA: Diagnosis not present

## 2015-10-29 DIAGNOSIS — M25561 Pain in right knee: Secondary | ICD-10-CM | POA: Diagnosis not present

## 2015-11-02 DIAGNOSIS — S83511A Sprain of anterior cruciate ligament of right knee, initial encounter: Secondary | ICD-10-CM | POA: Diagnosis not present

## 2015-11-03 ENCOUNTER — Encounter: Payer: Medicare Other | Attending: Physical Medicine & Rehabilitation | Admitting: Physical Medicine & Rehabilitation

## 2015-11-03 ENCOUNTER — Encounter: Payer: Self-pay | Admitting: Physical Medicine & Rehabilitation

## 2015-11-03 VITALS — BP 87/63 | HR 67 | Temp 97.9°F | Resp 14

## 2015-11-03 DIAGNOSIS — IMO0002 Reserved for concepts with insufficient information to code with codable children: Secondary | ICD-10-CM

## 2015-11-03 DIAGNOSIS — G43709 Chronic migraine without aura, not intractable, without status migrainosus: Secondary | ICD-10-CM | POA: Diagnosis not present

## 2015-11-03 DIAGNOSIS — R51 Headache: Secondary | ICD-10-CM | POA: Diagnosis not present

## 2015-11-03 NOTE — Progress Notes (Signed)
Botox Injection for chronic migraine headaches ICD 10: G43.709   Dilution: 100 Units/ 18ml preservative free NS x2 Indication: refractory headaches (At least 15 days per month/headache lasting greater than 4 hours per day) incompletely responsive to other more conservative measures.  Informed consent was obtained after describing risks and benefits of the procedure with the patient. This includes bleeding, bruising, infection, excessive weakness, or medication side effects. A REMS form is on file and signed. Needle: 30g 1/2 inch needle  Injection #4, last injection 07/19/15. Since last injection patient went to the ED for migraines, she notes the migraine was not overwhelming, but persistent.  She states without the botox it is much more persistent and excruciating.  Without botox >4 episodes/month, lasting 4-5 days, severity 8/10with botox <2/month, lasting <1 day, severity 2/10.  On last injection the dose to the left temporalis was increased and she notes even more improvement with the adjustment, also noting lack of pain behind her eyes.    Number of units per muscle:  Right temporalis 20 units, 4 access points Left temporalis 40 units,  4 access points Right frontalis 10 units, 2 access points Left frontalis 10 units, 2 access points Procerus 5 units, 1 access point Right corrugator 5 units, 1 access point Left corrugator 5 units, 1 access point Right occipitalis 15 units, 3 access points Left occipitalis 15 units, 3 access points Right cervical paraspinal 10 units, 2 access points Left cervical paraspinals 10 units, 2 access points  Right trapezius 15 units, 3 access points Left trapezius 15 units, 3 access points   Remaining 25 units were discarded.  All injections were done after  after negative drawback for blood. The patient tolerated the procedure well. Post procedure instructions were given. A followup appointment was made.  RTC 3 months

## 2015-11-03 NOTE — Progress Notes (Signed)
   Subjective:    Patient ID: Lauren Chapman, female    DOB: Nov 18, 1970, 45 y.o.   MRN: [id number]  HPI Botox Injection for chronic migraine headaches ICD 10: G43.709   Dilution: 100 Units/ 20ml preservative free NS x2 Indication: refractory headaches (At least 15 days per month/headache lasting greater than 4 hours per day) incompletely responsive to other more conservative measures.  Informed consent was obtained after describing risks and benefits of the procedure with the patient. This includes bleeding, bruising, infection, excessive weakness, or medication side effects. A REMS form is on file and signed. Needle: 30g 1/2 inch needle  Injection #3, last injection 04/29/15.  Number of units per muscle:  Right temporalis 20 units, 4 access points Left temporalis 40 units,  4 access points Right frontalis 10 units, 2 access points Left frontalis 10 units, 2 access points Procerus 5 units, 1 access point Right corrugator 5 units, 1 access point Left corrugator 5 units, 1 access point Right occipitalis 15 units, 3 access points Left occipitalis 15 units, 3 access points Right cervical paraspinal 10 units, 2 access points Left cervical paraspinals 10 units, 2 access points  Right trapezius 15 units, 3 access points Left trapezius 15 units, 3 access points   (Increased Left temporalis units from 20/4 to 40/4)  Remaining 25 units were discarded.  All injections were done after  after negative drawback for blood. The patient tolerated the procedure well. Post procedure instructions were given. A followup appointment was made.  RTC 3 months    Review of Systems     Objective:   Physical Exam        Assessment & Plan:

## 2015-11-12 DIAGNOSIS — S83511A Sprain of anterior cruciate ligament of right knee, initial encounter: Secondary | ICD-10-CM | POA: Diagnosis not present

## 2015-11-12 DIAGNOSIS — G8918 Other acute postprocedural pain: Secondary | ICD-10-CM | POA: Diagnosis not present

## 2015-11-12 DIAGNOSIS — M25361 Other instability, right knee: Secondary | ICD-10-CM | POA: Diagnosis not present

## 2015-11-22 DIAGNOSIS — F3130 Bipolar disorder, current episode depressed, mild or moderate severity, unspecified: Secondary | ICD-10-CM | POA: Diagnosis not present

## 2015-11-22 DIAGNOSIS — F250 Schizoaffective disorder, bipolar type: Secondary | ICD-10-CM | POA: Diagnosis not present

## 2015-11-23 DIAGNOSIS — S83511D Sprain of anterior cruciate ligament of right knee, subsequent encounter: Secondary | ICD-10-CM | POA: Diagnosis not present

## 2015-11-25 DIAGNOSIS — M25661 Stiffness of right knee, not elsewhere classified: Secondary | ICD-10-CM | POA: Diagnosis not present

## 2015-11-25 DIAGNOSIS — S83511D Sprain of anterior cruciate ligament of right knee, subsequent encounter: Secondary | ICD-10-CM | POA: Diagnosis not present

## 2015-11-25 DIAGNOSIS — M25561 Pain in right knee: Secondary | ICD-10-CM | POA: Diagnosis not present

## 2015-11-29 DIAGNOSIS — S83511D Sprain of anterior cruciate ligament of right knee, subsequent encounter: Secondary | ICD-10-CM | POA: Diagnosis not present

## 2015-11-29 DIAGNOSIS — M25561 Pain in right knee: Secondary | ICD-10-CM | POA: Diagnosis not present

## 2015-11-29 DIAGNOSIS — M25661 Stiffness of right knee, not elsewhere classified: Secondary | ICD-10-CM | POA: Diagnosis not present

## 2015-11-30 DIAGNOSIS — Z23 Encounter for immunization: Secondary | ICD-10-CM | POA: Diagnosis not present

## 2015-12-01 DIAGNOSIS — S83511D Sprain of anterior cruciate ligament of right knee, subsequent encounter: Secondary | ICD-10-CM | POA: Diagnosis not present

## 2015-12-01 DIAGNOSIS — M25661 Stiffness of right knee, not elsewhere classified: Secondary | ICD-10-CM | POA: Diagnosis not present

## 2015-12-01 DIAGNOSIS — M25561 Pain in right knee: Secondary | ICD-10-CM | POA: Diagnosis not present

## 2015-12-06 DIAGNOSIS — M25561 Pain in right knee: Secondary | ICD-10-CM | POA: Diagnosis not present

## 2015-12-06 DIAGNOSIS — M25661 Stiffness of right knee, not elsewhere classified: Secondary | ICD-10-CM | POA: Diagnosis not present

## 2015-12-06 DIAGNOSIS — S83511D Sprain of anterior cruciate ligament of right knee, subsequent encounter: Secondary | ICD-10-CM | POA: Diagnosis not present

## 2015-12-07 DIAGNOSIS — M25561 Pain in right knee: Secondary | ICD-10-CM | POA: Diagnosis not present

## 2015-12-08 DIAGNOSIS — S83511D Sprain of anterior cruciate ligament of right knee, subsequent encounter: Secondary | ICD-10-CM | POA: Diagnosis not present

## 2015-12-08 DIAGNOSIS — M25561 Pain in right knee: Secondary | ICD-10-CM | POA: Diagnosis not present

## 2015-12-08 DIAGNOSIS — M25661 Stiffness of right knee, not elsewhere classified: Secondary | ICD-10-CM | POA: Diagnosis not present

## 2015-12-13 DIAGNOSIS — S83511D Sprain of anterior cruciate ligament of right knee, subsequent encounter: Secondary | ICD-10-CM | POA: Diagnosis not present

## 2015-12-13 DIAGNOSIS — M25661 Stiffness of right knee, not elsewhere classified: Secondary | ICD-10-CM | POA: Diagnosis not present

## 2015-12-14 DIAGNOSIS — M25661 Stiffness of right knee, not elsewhere classified: Secondary | ICD-10-CM | POA: Diagnosis not present

## 2015-12-14 DIAGNOSIS — S83511D Sprain of anterior cruciate ligament of right knee, subsequent encounter: Secondary | ICD-10-CM | POA: Diagnosis not present

## 2015-12-14 DIAGNOSIS — M25561 Pain in right knee: Secondary | ICD-10-CM | POA: Diagnosis not present

## 2015-12-15 DIAGNOSIS — F250 Schizoaffective disorder, bipolar type: Secondary | ICD-10-CM | POA: Diagnosis not present

## 2015-12-20 DIAGNOSIS — F250 Schizoaffective disorder, bipolar type: Secondary | ICD-10-CM | POA: Diagnosis not present

## 2015-12-20 DIAGNOSIS — F3130 Bipolar disorder, current episode depressed, mild or moderate severity, unspecified: Secondary | ICD-10-CM | POA: Diagnosis not present

## 2015-12-21 DIAGNOSIS — M25561 Pain in right knee: Secondary | ICD-10-CM | POA: Diagnosis not present

## 2015-12-21 DIAGNOSIS — S83511D Sprain of anterior cruciate ligament of right knee, subsequent encounter: Secondary | ICD-10-CM | POA: Diagnosis not present

## 2015-12-21 DIAGNOSIS — M25661 Stiffness of right knee, not elsewhere classified: Secondary | ICD-10-CM | POA: Diagnosis not present

## 2015-12-23 DIAGNOSIS — S83511D Sprain of anterior cruciate ligament of right knee, subsequent encounter: Secondary | ICD-10-CM | POA: Diagnosis not present

## 2015-12-23 DIAGNOSIS — M25561 Pain in right knee: Secondary | ICD-10-CM | POA: Diagnosis not present

## 2015-12-23 DIAGNOSIS — M25661 Stiffness of right knee, not elsewhere classified: Secondary | ICD-10-CM | POA: Diagnosis not present

## 2015-12-27 DIAGNOSIS — M25561 Pain in right knee: Secondary | ICD-10-CM | POA: Diagnosis not present

## 2015-12-27 DIAGNOSIS — M25661 Stiffness of right knee, not elsewhere classified: Secondary | ICD-10-CM | POA: Diagnosis not present

## 2015-12-27 DIAGNOSIS — S83511D Sprain of anterior cruciate ligament of right knee, subsequent encounter: Secondary | ICD-10-CM | POA: Diagnosis not present

## 2015-12-29 DIAGNOSIS — M25561 Pain in right knee: Secondary | ICD-10-CM | POA: Diagnosis not present

## 2015-12-29 DIAGNOSIS — S83511D Sprain of anterior cruciate ligament of right knee, subsequent encounter: Secondary | ICD-10-CM | POA: Diagnosis not present

## 2015-12-29 DIAGNOSIS — M25661 Stiffness of right knee, not elsewhere classified: Secondary | ICD-10-CM | POA: Diagnosis not present

## 2016-01-03 DIAGNOSIS — M25661 Stiffness of right knee, not elsewhere classified: Secondary | ICD-10-CM | POA: Diagnosis not present

## 2016-01-03 DIAGNOSIS — S83511D Sprain of anterior cruciate ligament of right knee, subsequent encounter: Secondary | ICD-10-CM | POA: Diagnosis not present

## 2016-01-03 DIAGNOSIS — M25561 Pain in right knee: Secondary | ICD-10-CM | POA: Diagnosis not present

## 2016-01-05 DIAGNOSIS — M25561 Pain in right knee: Secondary | ICD-10-CM | POA: Diagnosis not present

## 2016-01-05 DIAGNOSIS — S83511D Sprain of anterior cruciate ligament of right knee, subsequent encounter: Secondary | ICD-10-CM | POA: Diagnosis not present

## 2016-01-05 DIAGNOSIS — M25661 Stiffness of right knee, not elsewhere classified: Secondary | ICD-10-CM | POA: Diagnosis not present

## 2016-01-06 DIAGNOSIS — M25561 Pain in right knee: Secondary | ICD-10-CM | POA: Diagnosis not present

## 2016-01-06 DIAGNOSIS — S83511D Sprain of anterior cruciate ligament of right knee, subsequent encounter: Secondary | ICD-10-CM | POA: Diagnosis not present

## 2016-01-10 DIAGNOSIS — M25661 Stiffness of right knee, not elsewhere classified: Secondary | ICD-10-CM | POA: Diagnosis not present

## 2016-01-10 DIAGNOSIS — M25561 Pain in right knee: Secondary | ICD-10-CM | POA: Diagnosis not present

## 2016-01-10 DIAGNOSIS — S83511D Sprain of anterior cruciate ligament of right knee, subsequent encounter: Secondary | ICD-10-CM | POA: Diagnosis not present

## 2016-01-11 DIAGNOSIS — M25561 Pain in right knee: Secondary | ICD-10-CM | POA: Diagnosis not present

## 2016-01-11 DIAGNOSIS — S83511D Sprain of anterior cruciate ligament of right knee, subsequent encounter: Secondary | ICD-10-CM | POA: Diagnosis not present

## 2016-01-11 DIAGNOSIS — M25661 Stiffness of right knee, not elsewhere classified: Secondary | ICD-10-CM | POA: Diagnosis not present

## 2016-01-19 DIAGNOSIS — M25561 Pain in right knee: Secondary | ICD-10-CM | POA: Diagnosis not present

## 2016-01-19 DIAGNOSIS — M25661 Stiffness of right knee, not elsewhere classified: Secondary | ICD-10-CM | POA: Diagnosis not present

## 2016-01-19 DIAGNOSIS — S83511D Sprain of anterior cruciate ligament of right knee, subsequent encounter: Secondary | ICD-10-CM | POA: Diagnosis not present

## 2016-01-21 DIAGNOSIS — S83511D Sprain of anterior cruciate ligament of right knee, subsequent encounter: Secondary | ICD-10-CM | POA: Diagnosis not present

## 2016-01-21 DIAGNOSIS — M25661 Stiffness of right knee, not elsewhere classified: Secondary | ICD-10-CM | POA: Diagnosis not present

## 2016-01-21 DIAGNOSIS — M25561 Pain in right knee: Secondary | ICD-10-CM | POA: Diagnosis not present

## 2016-01-25 DIAGNOSIS — M25561 Pain in right knee: Secondary | ICD-10-CM | POA: Diagnosis not present

## 2016-01-25 DIAGNOSIS — M25661 Stiffness of right knee, not elsewhere classified: Secondary | ICD-10-CM | POA: Diagnosis not present

## 2016-01-25 DIAGNOSIS — S83511D Sprain of anterior cruciate ligament of right knee, subsequent encounter: Secondary | ICD-10-CM | POA: Diagnosis not present

## 2016-01-27 DIAGNOSIS — S83511D Sprain of anterior cruciate ligament of right knee, subsequent encounter: Secondary | ICD-10-CM | POA: Diagnosis not present

## 2016-01-27 DIAGNOSIS — M25561 Pain in right knee: Secondary | ICD-10-CM | POA: Diagnosis not present

## 2016-01-27 DIAGNOSIS — M25661 Stiffness of right knee, not elsewhere classified: Secondary | ICD-10-CM | POA: Diagnosis not present

## 2016-02-01 DIAGNOSIS — S83511D Sprain of anterior cruciate ligament of right knee, subsequent encounter: Secondary | ICD-10-CM | POA: Diagnosis not present

## 2016-02-01 DIAGNOSIS — M25561 Pain in right knee: Secondary | ICD-10-CM | POA: Diagnosis not present

## 2016-02-01 DIAGNOSIS — M25661 Stiffness of right knee, not elsewhere classified: Secondary | ICD-10-CM | POA: Diagnosis not present

## 2016-02-02 DIAGNOSIS — F250 Schizoaffective disorder, bipolar type: Secondary | ICD-10-CM | POA: Diagnosis not present

## 2016-02-03 ENCOUNTER — Encounter: Payer: Medicare Other | Admitting: Physical Medicine & Rehabilitation

## 2016-02-03 DIAGNOSIS — S83511D Sprain of anterior cruciate ligament of right knee, subsequent encounter: Secondary | ICD-10-CM | POA: Diagnosis not present

## 2016-02-03 DIAGNOSIS — M25661 Stiffness of right knee, not elsewhere classified: Secondary | ICD-10-CM | POA: Diagnosis not present

## 2016-02-03 DIAGNOSIS — M25561 Pain in right knee: Secondary | ICD-10-CM | POA: Diagnosis not present

## 2016-02-04 ENCOUNTER — Encounter: Payer: Medicare Other | Attending: Physical Medicine & Rehabilitation | Admitting: Physical Medicine & Rehabilitation

## 2016-02-04 ENCOUNTER — Encounter: Payer: Self-pay | Admitting: Physical Medicine & Rehabilitation

## 2016-02-04 VITALS — BP 105/68 | HR 59 | Resp 16

## 2016-02-04 DIAGNOSIS — IMO0002 Reserved for concepts with insufficient information to code with codable children: Secondary | ICD-10-CM

## 2016-02-04 DIAGNOSIS — R51 Headache: Secondary | ICD-10-CM | POA: Insufficient documentation

## 2016-02-04 DIAGNOSIS — G43709 Chronic migraine without aura, not intractable, without status migrainosus: Secondary | ICD-10-CM | POA: Diagnosis not present

## 2016-02-04 NOTE — Progress Notes (Signed)
Botox Injection for chronic migraine headaches ICD 10: G43.709   Dilution: 100 Units/ 19ml preservative free NS x2 Indication: refractory headaches (At least 15 days per month/headache lasting greater than 4 hours per day) incompletely responsive to other more conservative measures.  Informed consent was obtained after describing risks and benefits of the procedure with the patient. This includes bleeding, bruising, infection, excessive weakness, or medication side effects. A REMS form is on file and signed. Needle: 30g 1/2 inch needle  Injection #5, last injection 11/03/15. Since last injection patient notes continued improvement with severity of headaches.    Number of units per muscle:  Right temporalis 20 units, 4 access points Left temporalis 40 units,  4 access points Right frontalis 10 units, 2 access points Left frontalis 10 units, 2 access points Procerus 5 units, 1 access point Right corrugator 5 units, 1 access point Left corrugator 5 units, 1 access point Right occipitalis 15 units, 3 access points Left occipitalis 15 units, 3 access points Right cervical paraspinal 10 units, 2 access points Left cervical paraspinals 10 units, 2 access points  Right trapezius 15 units, 3 access points Left trapezius 15 units, 3 access points   Remaining 25 units were discarded.  All injections were done after  after negative drawback for blood. The patient tolerated the procedure well. Post procedure instructions were given. A followup appointment was made.  RTC 3 months

## 2016-02-08 DIAGNOSIS — M25561 Pain in right knee: Secondary | ICD-10-CM | POA: Diagnosis not present

## 2016-02-08 DIAGNOSIS — M25661 Stiffness of right knee, not elsewhere classified: Secondary | ICD-10-CM | POA: Diagnosis not present

## 2016-02-08 DIAGNOSIS — S83511D Sprain of anterior cruciate ligament of right knee, subsequent encounter: Secondary | ICD-10-CM | POA: Diagnosis not present

## 2016-02-11 DIAGNOSIS — M25561 Pain in right knee: Secondary | ICD-10-CM | POA: Diagnosis not present

## 2016-02-11 DIAGNOSIS — S83511D Sprain of anterior cruciate ligament of right knee, subsequent encounter: Secondary | ICD-10-CM | POA: Diagnosis not present

## 2016-02-11 DIAGNOSIS — M25661 Stiffness of right knee, not elsewhere classified: Secondary | ICD-10-CM | POA: Diagnosis not present

## 2016-02-14 DIAGNOSIS — M25661 Stiffness of right knee, not elsewhere classified: Secondary | ICD-10-CM | POA: Diagnosis not present

## 2016-02-14 DIAGNOSIS — M25561 Pain in right knee: Secondary | ICD-10-CM | POA: Diagnosis not present

## 2016-02-14 DIAGNOSIS — S83511D Sprain of anterior cruciate ligament of right knee, subsequent encounter: Secondary | ICD-10-CM | POA: Diagnosis not present

## 2016-02-17 DIAGNOSIS — M25661 Stiffness of right knee, not elsewhere classified: Secondary | ICD-10-CM | POA: Diagnosis not present

## 2016-02-17 DIAGNOSIS — M25561 Pain in right knee: Secondary | ICD-10-CM | POA: Diagnosis not present

## 2016-02-17 DIAGNOSIS — S83511D Sprain of anterior cruciate ligament of right knee, subsequent encounter: Secondary | ICD-10-CM | POA: Diagnosis not present

## 2016-02-21 DIAGNOSIS — M25561 Pain in right knee: Secondary | ICD-10-CM | POA: Diagnosis not present

## 2016-02-21 DIAGNOSIS — M25661 Stiffness of right knee, not elsewhere classified: Secondary | ICD-10-CM | POA: Diagnosis not present

## 2016-02-21 DIAGNOSIS — S83511D Sprain of anterior cruciate ligament of right knee, subsequent encounter: Secondary | ICD-10-CM | POA: Diagnosis not present

## 2016-02-23 DIAGNOSIS — S83511D Sprain of anterior cruciate ligament of right knee, subsequent encounter: Secondary | ICD-10-CM | POA: Diagnosis not present

## 2016-02-23 DIAGNOSIS — M25561 Pain in right knee: Secondary | ICD-10-CM | POA: Diagnosis not present

## 2016-02-23 DIAGNOSIS — M25661 Stiffness of right knee, not elsewhere classified: Secondary | ICD-10-CM | POA: Diagnosis not present

## 2016-02-24 DIAGNOSIS — F250 Schizoaffective disorder, bipolar type: Secondary | ICD-10-CM | POA: Diagnosis not present

## 2016-03-16 ENCOUNTER — Emergency Department (HOSPITAL_COMMUNITY)
Admission: EM | Admit: 2016-03-16 | Discharge: 2016-03-17 | Disposition: A | Discharge status: Home / Self Care | Payer: Medicare HMO | Attending: Emergency Medicine | Admitting: Emergency Medicine

## 2016-03-16 ENCOUNTER — Encounter (HOSPITAL_COMMUNITY): Payer: Self-pay

## 2016-03-16 DIAGNOSIS — Z7982 Long term (current) use of aspirin: Secondary | ICD-10-CM | POA: Diagnosis not present

## 2016-03-16 DIAGNOSIS — G43809 Other migraine, not intractable, without status migrainosus: Secondary | ICD-10-CM | POA: Insufficient documentation

## 2016-03-16 DIAGNOSIS — F17210 Nicotine dependence, cigarettes, uncomplicated: Secondary | ICD-10-CM | POA: Diagnosis not present

## 2016-03-16 DIAGNOSIS — G43909 Migraine, unspecified, not intractable, without status migrainosus: Secondary | ICD-10-CM | POA: Diagnosis present

## 2016-03-16 LAB — MAGNESIUM: MAGNESIUM: 2.2 mg/dL (ref 1.7–2.4)

## 2016-03-16 MED ORDER — SODIUM CHLORIDE 0.9 % IV BOLUS (SEPSIS)
1000.0000 mL | Freq: Once | INTRAVENOUS | Status: AC
Start: 2016-03-17 — End: 2016-03-17
  Administered 2016-03-17: 1000 mL via INTRAVENOUS

## 2016-03-16 MED ORDER — PROCHLORPERAZINE EDISYLATE 5 MG/ML IJ SOLN
10.0000 mg | Freq: Once | INTRAMUSCULAR | Status: AC
  Administered 2016-03-16: 10 mg via INTRAVENOUS
  Filled 2016-03-16: qty 2

## 2016-03-16 MED ORDER — HALOPERIDOL LACTATE 5 MG/ML IJ SOLN
2.5000 mg | Freq: Once | INTRAMUSCULAR | Status: AC
  Administered 2016-03-17: 2.5 mg via INTRAVENOUS
  Filled 2016-03-16: qty 1

## 2016-03-16 MED ORDER — KETOROLAC TROMETHAMINE 30 MG/ML IJ SOLN
30.0000 mg | Freq: Once | INTRAMUSCULAR | Status: AC
  Administered 2016-03-16: 30 mg via INTRAVENOUS
  Filled 2016-03-16: qty 1

## 2016-03-16 MED ORDER — SODIUM CHLORIDE 0.9 % IV BOLUS (SEPSIS)
1000.0000 mL | Freq: Once | INTRAVENOUS | Status: AC
  Administered 2016-03-16: 1000 mL via INTRAVENOUS

## 2016-03-16 MED ORDER — SUMATRIPTAN SUCCINATE 6 MG/0.5ML ~~LOC~~ SOLN
6.0000 mg | Freq: Once | SUBCUTANEOUS | Status: AC
  Administered 2016-03-17: 6 mg via SUBCUTANEOUS
  Filled 2016-03-16: qty 0.5

## 2016-03-16 MED ORDER — MAGNESIUM SULFATE 2 GM/50ML IV SOLN
2.0000 g | Freq: Once | INTRAVENOUS | Status: AC
  Administered 2016-03-17: 2 g via INTRAVENOUS
  Filled 2016-03-16: qty 50

## 2016-03-16 MED ORDER — DIPHENHYDRAMINE HCL 50 MG/ML IJ SOLN
25.0000 mg | Freq: Once | INTRAMUSCULAR | Status: AC
  Administered 2016-03-16: 25 mg via INTRAVENOUS
  Filled 2016-03-16: qty 1

## 2016-03-16 MED ORDER — LORAZEPAM 2 MG/ML IJ SOLN
0.5000 mg | Freq: Once | INTRAMUSCULAR | Status: AC
  Administered 2016-03-17: 0.5 mg via INTRAVENOUS
  Filled 2016-03-16: qty 1

## 2016-03-16 NOTE — ED Triage Notes (Signed)
Migraine headache for 4 days like typical headache took Imitrex and other medications without relief alert and oriented x 3 steady gait noted.

## 2016-03-16 NOTE — ED Provider Notes (Signed)
[facility] DEPT Provider Note  CSN: [id number] Arrival date & time: 03/16/16  [id number] By signing my name below, I, Lauren Chapman, attest that this documentation has been prepared under the direction and in the presence of non-physician practitioner, Monico Blitz, PA-C. Electronically Signed: Dyke Chapman, Scribe. 03/16/2016. 8:57 PM.   History   Chief Complaint Chief Complaint  Patient presents with  . Migraine    HPI Lauren Chapman is a 46 y.o. female with a history of chronic migranes who presents to the Emergency Department complaining of sudden onset, constant headache onset three or four days ago. She describes this as a 7/10 throbbing, frontal headache. She notes associated photophobia, phonophobia, nausea and vomiting. Pt has taken Imitrex and Excedrin Migraine with no relief. Per pt, her headaches typically begin suddenly and states this feels like her typical headache .Pt is followed by [facility] for migraines and is on prophylactic medications. Pt denies any weakness, numbness, visual disturbances, neck pain or any other associated symptoms.   The history is provided by the patient. No language interpreter was used.   Past Medical History:  Diagnosis Date  . Anxiety   . Bipolar affective psychosis ([place])   . Depression   . GERD (gastroesophageal reflux disease)   . Migraine     Patient Active Problem List   Diagnosis Date Noted  . Chronic migraine 12/22/2014  . Encounter for other general counseling or advice on contraception 09/02/2014  . Bipolar disorder ([place]) 10/12/2011  . Anxiety and depression 10/12/2011  . GERD (gastroesophageal reflux disease) 10/12/2011    Past Surgical History:  Procedure Laterality Date  . CARPAL TUNNEL RELEASE    . CHOLECYSTECTOMY    . GANGLION CYST EXCISION     RIGHT wrist    OB History    Gravida Para Term Preterm AB Living   3 2 2  0 1 2   SAB TAB Ectopic Multiple Live Births   1 0 0 0         Home Medications    Prior to Admission medications   Medication Sig Start Date End Date Taking? Authorizing Provider  Aspirin-Acetaminophen-Caffeine (EXCEDRIN MIGRAINE PO) Take 2 tablets by mouth every 6 (six) hours as needed (headache/pain).    Historical Provider, MD  Aspirin-Salicylamide-Caffeine (BC HEADACHE POWDER PO) Take 1 packet by mouth daily as needed (headaches).    Historical Provider, MD  benztropine (COGENTIN) 1 MG tablet Take 1 mg by mouth 2 (two) times daily.    Historical Provider, MD  esomeprazole (NEXIUM) 20 MG capsule Take 20 mg by mouth daily at 12 noon.    Historical Provider, MD  fluticasone (FLONASE) 50 MCG/ACT nasal spray Place 2 sprays into both nostrils daily. Patient taking differently: Place 2 sprays into both nostrils daily as needed for allergies.  03/22/15   Wardell Honour, MD  gabapentin (NEURONTIN) 400 MG capsule Take 1,200 mg by mouth 4 (four) times daily. 01/13/15   Historical Provider, MD  ibuprofen (ADVIL,MOTRIN) 800 MG tablet Take 800 mg by mouth every 8 (eight) hours as needed for moderate pain.     Historical Provider, MD  lamoTRIgine (LAMICTAL) 150 MG tablet Take 150 mg by mouth daily.    Historical Provider, MD  naproxen sodium (ANAPROX DS) 550 MG tablet Take 1 tablet (550 mg total) by mouth 2 (two) times daily with a meal. Patient taking differently: Take 550 mg by mouth 2 (two) times daily as needed for moderate pain.  08/17/15   [name],  PA-C  perphenazine (TRILAFON) 16 MG tablet Take 32 mg by mouth at bedtime.    Historical Provider, MD  promethazine (PHENERGAN) 25 MG tablet Take 1 tablet (25 mg total) by mouth every 6 (six) hours as needed for nausea or vomiting. 12/22/14   Marcial Pacas, MD  propranolol (INDERAL) 60 MG tablet Take 1 tablet (60 mg total) by mouth 2 (two) times daily. 03/08/15   Marcial Pacas, MD  risperiDONE (RISPERDAL) 3 MG tablet Take 3 mg by mouth daily. 3 mg q am and 6 mg q pm 01/13/15   Historical Provider, MD  SUMAtriptan  (IMITREX) 6 MG/0.5ML SOLN injection Inject 0.5 mLs (6 mg total) into the skin every 2 (two) hours as needed for migraine or headache. May repeat in 2 hours if headache persists or recurs. 03/08/15   Marcial Pacas, MD  Vortioxetine HBr (BRINTELLIX) 20 MG TABS Take 20 mg by mouth daily.     Historical Provider, MD    Family History Family History  Problem Relation Age of Onset  . Depression Mother   . Alcohol abuse Father   . Crohn's disease Sister   . Stroke Brother 73  . Migraines Daughter   . GER disease Daughter   . Migraines Daughter   . GER disease Daughter     Social History Social History  Substance Use Topics  . Smoking status: Current Every Day Smoker    Packs/day: 0.25    Years: 33.00    Types: Cigarettes    Start date: 10/10/1981  . Smokeless tobacco: Never Used  . Alcohol use 0.0 oz/week     Comment: occasionally     Allergies   Codeine  Review of Systems Review of Systems 10 systems reviewed and all are negative for acute change except as noted in the HPI.  Physical Exam Updated Vital Signs BP 113/71 (BP Location: Left Arm)   Pulse (!) 59   Temp 97.6 F (36.4 C) (Oral)   Resp 18   Ht 5\' 2"  (1.575 m)   Wt 72.6 kg   SpO2 94%   BMI 29.26 kg/m   Physical Exam  Constitutional: She is oriented to person, place, and time. She appears well-developed and well-nourished. No distress.  HENT:  Head: Normocephalic and atraumatic.  Mouth/Throat: Oropharynx is clear and moist.  Eyes: Conjunctivae and EOM are normal. Pupils are equal, round, and reactive to light.  No TTP of maxillary or frontal sinuses  No TTP or induration of temporal arteries bilaterally  Neck: Normal range of motion. Neck supple.  FROM to C-spine. Pt can touch chin to chest without discomfort. No TTP of midline cervical spine.   Cardiovascular: Normal rate, regular rhythm and intact distal pulses.   Pulmonary/Chest: Effort normal and breath sounds normal. No respiratory distress. She has no  wheezes. She has no rales. She exhibits no tenderness.  Abdominal: Soft. Bowel sounds are normal. She exhibits no distension. There is no tenderness.  Musculoskeletal: Normal range of motion. She exhibits no edema or tenderness.  Neurological: She is alert and oriented to person, place, and time. No cranial nerve deficit.  II-Visual fields grossly intact. III/IV/VI-Extraocular movements intact.  Pupils reactive bilaterally. V/VII-Smile symmetric, equal eyebrow raise,  facial sensation intact VIII- Hearing grossly intact IX/X-Normal gag XI-bilateral shoulder shrug XII-midline tongue extension Motor: 5/5 bilaterally with normal tone and bulk Cerebellar: Normal finger-to-nose  and normal heel-to-shin test.   Romberg negative Ambulates with a coordinated gait   Skin: Skin is warm and dry.  Psychiatric:  She has a normal mood and affect.  Nursing note and vitals reviewed.   ED Treatments / Results  DIAGNOSTIC STUDIES:  Oxygen Saturation is 95% on RA, adequate by my interpretation.    COORDINATION OF CARE:  8:51 PM Will order benadryl, compazine and Toradol. Discussed treatment plan with pt at bedside and pt agreed to plan.  Labs (all labs ordered are listed, but only abnormal results are displayed) Labs Reviewed  MAGNESIUM    EKG  EKG Interpretation None       Radiology No results found.  Procedures Procedures (including critical care time)  Medications Ordered in ED Medications  prochlorperazine (COMPAZINE) injection 10 mg (10 mg Intravenous Given 03/16/16 2121)  ketorolac (TORADOL) 30 MG/ML injection 30 mg (30 mg Intravenous Given 03/16/16 2121)  diphenhydrAMINE (BENADRYL) injection 25 mg (25 mg Intravenous Given 03/16/16 2121)  sodium chloride 0.9 % bolus 1,000 mL (0 mLs Intravenous Stopped 03/16/16 2223)  ketorolac (TORADOL) 30 MG/ML injection 30 mg (30 mg Intravenous Given 03/16/16 2223)  magnesium sulfate IVPB 2 g 50 mL (0 g Intravenous Stopped 03/17/16 0120)   SUMAtriptan (IMITREX) injection 6 mg (6 mg Subcutaneous Given 03/17/16 0012)  haloperidol lactate (HALDOL) injection 2.5 mg (2.5 mg Intravenous Given 03/17/16 0012)  LORazepam (ATIVAN) injection 0.5 mg (0.5 mg Intravenous Given 03/17/16 0011)  sodium chloride 0.9 % bolus 1,000 mL (0 mLs Intravenous Stopped 03/17/16 0120)     Initial Impression / Assessment and Plan / ED Course  I have reviewed the triage vital signs and the nursing notes.  Pertinent labs & imaging results that were available during my care of the patient were reviewed by me and considered in my medical decision making (see chart for details).     Vitals:   03/16/16 2006 03/16/16 2007 03/16/16 2225 03/17/16 0040  BP: 120/76  108/67 113/71  Pulse: 70  60 (!) 59  Resp: 16  16 18   Temp: 97.6 F (36.4 C)     TempSrc: Oral     SpO2: 95%  95% 94%  Weight:  72.6 kg    Height:  5\' 2"  (1.575 m)      Medications  prochlorperazine (COMPAZINE) injection 10 mg (10 mg Intravenous Given 03/16/16 2121)  ketorolac (TORADOL) 30 MG/ML injection 30 mg (30 mg Intravenous Given 03/16/16 2121)  diphenhydrAMINE (BENADRYL) injection 25 mg (25 mg Intravenous Given 03/16/16 2121)  sodium chloride 0.9 % bolus 1,000 mL (0 mLs Intravenous Stopped 03/16/16 2223)  ketorolac (TORADOL) 30 MG/ML injection 30 mg (30 mg Intravenous Given 03/16/16 2223)  magnesium sulfate IVPB 2 g 50 mL (0 g Intravenous Stopped 03/17/16 0120)  SUMAtriptan (IMITREX) injection 6 mg (6 mg Subcutaneous Given 03/17/16 0012)  haloperidol lactate (HALDOL) injection 2.5 mg (2.5 mg Intravenous Given 03/17/16 0012)  LORazepam (ATIVAN) injection 0.5 mg (0.5 mg Intravenous Given 03/17/16 0011)  sodium chloride 0.9 % bolus 1,000 mL (0 mLs Intravenous Stopped 03/17/16 0120)    ESTELLINE DEBARGE is 46 y.o. female presenting with HA. Presentation is like pts typical HA and non concerning for [facility], [facility], Meningitis, or temporal arteritis. Pt is afebrile with no focal neuro deficits, nuchal  rigidity, or change in vision. Pt is to follow up with PCP to discuss prophylactic medication. Pt verbalizes understanding and is agreeable with plan to dc.  Evaluation does not show pathology that would require ongoing emergent intervention or inpatient treatment. Pt is hemodynamically stable and mentating appropriately. Discussed findings and plan with patient/guardian, who agrees with care plan. All  questions answered. Return precautions discussed and outpatient follow up given.    Final Clinical Impressions(s) / ED Diagnoses   Final diagnoses:  Other migraine without status migrainosus, not intractable    New Prescriptions Discharge Medication List as of 03/17/2016  1:09 AM     I personally performed the services described in this documentation, which was scribed in my presence. The recorded information has been reviewed and is accurate.    Elmyra Ricks Minta Fair, PA-C 03/17/16 0150    Sherwood Gambler, MD 03/20/16 [phone]

## 2016-03-17 DIAGNOSIS — G43809 Other migraine, not intractable, without status migrainosus: Secondary | ICD-10-CM | POA: Diagnosis not present

## 2016-03-17 NOTE — Discharge Instructions (Signed)
Please check in with urine neurologist next week.   Don't hesitate to return to the emergency department for new, worsening or concerning symptoms.

## 2016-05-05 ENCOUNTER — Encounter: Payer: Medicare HMO | Attending: Physical Medicine & Rehabilitation | Admitting: Physical Medicine & Rehabilitation

## 2016-05-05 ENCOUNTER — Encounter: Payer: Self-pay | Admitting: Physical Medicine & Rehabilitation

## 2016-05-05 VITALS — BP 85/52 | HR 55

## 2016-05-05 DIAGNOSIS — G43709 Chronic migraine without aura, not intractable, without status migrainosus: Secondary | ICD-10-CM

## 2016-05-05 DIAGNOSIS — IMO0002 Reserved for concepts with insufficient information to code with codable children: Secondary | ICD-10-CM

## 2016-05-05 DIAGNOSIS — R51 Headache: Secondary | ICD-10-CM | POA: Diagnosis present

## 2016-05-05 NOTE — Progress Notes (Signed)
Botox Injection for chronic migraine headaches ICD 10: G43.709   Dilution: 100 Units/ 31ml preservative free NS x2 Indication: refractory headaches (At least 15 days per month/headache lasting greater than 4 hours per day) incompletely responsive to other more conservative measures.  Informed consent was obtained after describing risks and benefits of the procedure with the patient. This includes bleeding, bruising, infection, excessive weakness, or medication side effects. A REMS form is on file and signed. Needle: 30g 1/2 inch needle  Injection #6, last injection 02/04/16. Since last injection patient notes continued improvement with severity of headaches.    Number of units per muscle:  Right temporalis 20 units, 4 access points Left temporalis 40 units,  4 access points Right frontalis 10 units, 2 access points Left frontalis 10 units, 2 access points Procerus 5 units, 1 access point Right corrugator 5 units, 1 access point Left corrugator 5 units, 1 access point Right occipitalis 15 units, 3 access points Left occipitalis 15 units, 3 access points Right cervical paraspinal 10 units, 2 access points Left cervical paraspinals 10 units, 2 access points  Right trapezius 15 units, 3 access points Left trapezius 15 units, 3 access points   Remaining 25 units were discarded.  All injections were done after  after negative drawback for blood. The patient tolerated the procedure well. Post procedure instructions were given. A followup appointment was made.  RTC 3 months

## 2016-05-23 DIAGNOSIS — S83511D Sprain of anterior cruciate ligament of right knee, subsequent encounter: Secondary | ICD-10-CM | POA: Diagnosis not present

## 2016-05-23 DIAGNOSIS — M25661 Stiffness of right knee, not elsewhere classified: Secondary | ICD-10-CM | POA: Diagnosis not present

## 2016-05-23 DIAGNOSIS — M25561 Pain in right knee: Secondary | ICD-10-CM | POA: Diagnosis not present

## 2016-06-26 DIAGNOSIS — H53149 Visual discomfort, unspecified: Secondary | ICD-10-CM | POA: Diagnosis not present

## 2016-06-26 DIAGNOSIS — Z87891 Personal history of nicotine dependence: Secondary | ICD-10-CM | POA: Diagnosis not present

## 2016-06-26 DIAGNOSIS — G43109 Migraine with aura, not intractable, without status migrainosus: Secondary | ICD-10-CM | POA: Diagnosis not present

## 2016-06-26 DIAGNOSIS — R42 Dizziness and giddiness: Secondary | ICD-10-CM | POA: Diagnosis not present

## 2016-06-26 DIAGNOSIS — F259 Schizoaffective disorder, unspecified: Secondary | ICD-10-CM | POA: Diagnosis not present

## 2016-06-26 DIAGNOSIS — R112 Nausea with vomiting, unspecified: Secondary | ICD-10-CM | POA: Diagnosis not present

## 2016-07-04 ENCOUNTER — Other Ambulatory Visit: Payer: Self-pay | Admitting: Neurology

## 2016-07-04 NOTE — Telephone Encounter (Signed)
Pt has scheduled an appointment with Dr Krista Blue for Sept but would like to know if her propranolol (INDERAL) 60 MG tablet could be called in while she waits to see Dr Krista Blue, please call

## 2016-07-04 NOTE — Telephone Encounter (Signed)
PT last seen 03/2015. Has appt with Dr. Krista Blue in 08/2016, would like refill on inderal.

## 2016-07-05 MED ORDER — PROPRANOLOL HCL 60 MG PO TABS: 60.0000 mg | ORAL_TABLET | Freq: Two times a day (BID) | ORAL | 3 refills | Status: DC

## 2016-07-05 NOTE — Telephone Encounter (Signed)
I have refilled her prapanolol

## 2016-07-05 NOTE — Addendum Note (Signed)
Addended by: Marcial Pacas on: 07/05/2016 08:00 AM   Modules accepted: Orders

## 2016-07-10 DIAGNOSIS — F250 Schizoaffective disorder, bipolar type: Secondary | ICD-10-CM | POA: Diagnosis not present

## 2016-07-31 DIAGNOSIS — F250 Schizoaffective disorder, bipolar type: Secondary | ICD-10-CM | POA: Diagnosis not present

## 2016-08-04 ENCOUNTER — Encounter: Payer: Medicare Other | Attending: Physical Medicine & Rehabilitation | Admitting: Physical Medicine & Rehabilitation

## 2016-08-04 ENCOUNTER — Encounter: Payer: Medicare Other | Admitting: Physical Medicine & Rehabilitation

## 2016-08-04 ENCOUNTER — Encounter: Payer: Self-pay | Admitting: Physical Medicine & Rehabilitation

## 2016-08-04 VITALS — BP 90/61 | HR 78

## 2016-08-04 DIAGNOSIS — IMO0002 Reserved for concepts with insufficient information to code with codable children: Secondary | ICD-10-CM

## 2016-08-04 DIAGNOSIS — R51 Headache: Secondary | ICD-10-CM | POA: Diagnosis not present

## 2016-08-04 DIAGNOSIS — G43709 Chronic migraine without aura, not intractable, without status migrainosus: Secondary | ICD-10-CM

## 2016-08-04 NOTE — Progress Notes (Signed)
Botox Injection for chronic migraine headaches ICD 10: G43.709   Dilution: 100 Units/ 47ml preservative free NS x2 Indication: refractory headaches (At least 15 days per month/headache lasting greater than 4 hours per day) incompletely responsive to other more conservative measures.  Informed consent was obtained after describing risks and benefits of the procedure with the patient. This includes bleeding, bruising, infection, excessive weakness, or medication side effects. A REMS form is on file and signed. Needle: 30g 1/2 inch needle  Injection #7, last injection 05/05/16. Since last injection patient notes continued improvement with severity of headaches.    Number of units per muscle:  Right temporalis 20 units, 4 access points Left temporalis 40 units,  4 access points Right frontalis 10 units, 2 access points Left frontalis 10 units, 2 access points Procerus 5 units, 1 access point Right corrugator 5 units, 1 access point Left corrugator 5 units, 1 access point Right occipitalis 15 units, 3 access points Left occipitalis 15 units, 3 access points Right cervical paraspinal 10 units, 2 access points Left cervical paraspinals 10 units, 2 access points  Right trapezius 15 units, 3 access points Left trapezius 15 units, 3 access points   Remaining 25 units were discarded.  All injections were done after  after negative drawback for blood. The patient tolerated the procedure well. Post procedure instructions were given. A followup appointment was made.  RTC 3 months

## 2016-08-09 DIAGNOSIS — F250 Schizoaffective disorder, bipolar type: Secondary | ICD-10-CM | POA: Diagnosis not present

## 2016-08-21 DIAGNOSIS — F250 Schizoaffective disorder, bipolar type: Secondary | ICD-10-CM | POA: Diagnosis not present

## 2016-09-08 DIAGNOSIS — F250 Schizoaffective disorder, bipolar type: Secondary | ICD-10-CM | POA: Diagnosis not present

## 2016-09-29 DIAGNOSIS — F3130 Bipolar disorder, current episode depressed, mild or moderate severity, unspecified: Secondary | ICD-10-CM | POA: Diagnosis not present

## 2016-09-29 DIAGNOSIS — F250 Schizoaffective disorder, bipolar type: Secondary | ICD-10-CM | POA: Diagnosis not present

## 2016-10-02 ENCOUNTER — Encounter: Payer: Self-pay | Admitting: Neurology

## 2016-10-02 ENCOUNTER — Ambulatory Visit (INDEPENDENT_AMBULATORY_CARE_PROVIDER_SITE_OTHER): Payer: Medicare Other | Admitting: Neurology

## 2016-10-02 VITALS — BP 105/64 | HR 54 | Ht 62.0 in | Wt 156.0 lb

## 2016-10-02 DIAGNOSIS — G43709 Chronic migraine without aura, not intractable, without status migrainosus: Secondary | ICD-10-CM | POA: Diagnosis not present

## 2016-10-02 DIAGNOSIS — IMO0002 Reserved for concepts with insufficient information to code with codable children: Secondary | ICD-10-CM

## 2016-10-02 MED ORDER — TIZANIDINE HCL 4 MG PO TABS: 4.0000 mg | ORAL_TABLET | Freq: Four times a day (QID) | ORAL | 0 refills | Status: DC | PRN

## 2016-10-02 MED ORDER — RIZATRIPTAN BENZOATE 10 MG PO TBDP: 10.0000 mg | ORAL_TABLET | ORAL | 6 refills | Status: DC | PRN

## 2016-10-02 MED ORDER — ONDANSETRON 4 MG PO TBDP: 4.0000 mg | ORAL_TABLET | Freq: Three times a day (TID) | ORAL | 6 refills | Status: DC | PRN

## 2016-10-02 NOTE — Patient Instructions (Signed)
Migraine prevention:  Magnesium oxide 400 mg twice a day Riboflavin 100 mg twice a day

## 2016-10-02 NOTE — Progress Notes (Signed)
Chief Complaint  Patient presents with  . Migraine    Last seen 02/2015. Feels her migraines are well controlled on current medication regimen.  She estimates getting two migraines per month.  Her PCP instucted her to reduce her propranolol to 30mg , BID due to low blood pressure.  She continues to get Botox injections with Dr. Posey Pronto.    Chief Complaint  Patient presents with  . Migraine    Last seen 02/2015. Feels her migraines are well controlled on current medication regimen.  She estimates getting two migraines per month.  Her PCP instucted her to reduce her propranolol to 30mg , BID due to low blood pressure.  She continues to get Botox injections with Dr. Posey Pronto.       PATIENT: Lauren Chapman DOB: 21-Jun-1970  Chief Complaint  Patient presents with  . Migraine    Last seen 02/2015. Feels her migraines are well controlled on current medication regimen.  She estimates getting two migraines per month.  Her PCP instucted her to reduce her propranolol to 30mg , BID due to low blood pressure.  She continues to get Botox injections with Dr. Posey Pronto.      HISTORICAL  Lauren Chapman is a 46 year old right-handed female, seen in refer by her primary care Bettey Costa, NP for evaluation of migraine in October 19 2014  I reviewed and summarized her office visit, reviewed laboratory in July 2016, mild elevated triglycerides 208,  She has a history of bipolar affective disorder, is on polypharmacy treatment, this including lamotrigine, Zoloft, Brintellix, Valium, Cogentin, gabapentin.  She started to have migraine since 46 years old, her typical migraine are lateralized severe pounding headache was associated light noise sensitivity, lasting 1-2 days, she often has alternating of sound, visual disturbance before the onset of migraine, and during severe headaches. over the years, she has had at least twice migraine each week, she has been taking frequent goody powders, which does not work anymore, now  she take over-the-counter ibuprofen and Aleve, up to 10 tablets each day to try to get rid of headache with suboptimal response. She was giving a prescription of Imitrex upon why ER presentation for her migraines, which does not work well, she reported loose control of her bowel movements after taking Imitrex.   She was not able to identify trigger for her migraine, she does couple days each week because of her severe headaches, she has never tried any preventive medications in the past  UPDATE Dec 22 2014: She is now taking Inderal 60 mg twice a day, tolerating medication well, she did see mild to moderate improvement of her headache instead of twice week, she is now having her typical migraine once a week, she has left-sided retro-orbital area severe pounding headache with associated light noise sensitivity, nauseous, it can last for a few days, Maxalt works sometimes, but she has to repeat multiple times, sometimes she go to hospital for injection, she has tried migraine cocktail years, Compazine, Benadryl, Toradol with limited help, sometimes she has to use Demerol about once a year for migraine  UPDATE Mar 08 2015: She did have Botox injection as migraine prevention by rehabilitation physician Dr. Posey Pronto February 04 2015, which has helped her headache, she has less severe, less frequent headaches, couple times each week, she is now receiving naproxen 1-2 times each week for mild to moderate headaches, use Imitrex injection for more severe headaches, which works well for her,  UPDATE Oct 02 2016: She is using imitrex prn once  a month, for severe migraine headaches, which did help her headaches, continue Botox injection by rehabilitation physician Dr. Posey Pronto, she reported significant improvement with Botox injection, she is now on lower dose of propanolol 30 mg twice a day, higher dose cause decreased blood pressure  She reported taking Excedrin Migraine about every other day for mild to moderate  headaches  REVIEW OF SYSTEMS: Full 14 system review of systems performed and notable only for: Light sensitivity, shortness of breath, diarrhea, incontinence of bowels, incontinence of bladder, dizziness, headaches, passing out, depression, anxiety hallucinations, suicidal thoughts  ALLERGIES: Allergies  Allergen Reactions  . Codeine Anaphylaxis    Anything with codeine    HOME MEDICATIONS: Current Outpatient Prescriptions  Medication Sig Dispense Refill  . Aspirin-Acetaminophen-Caffeine (EXCEDRIN MIGRAINE PO) Take 2 tablets by mouth every 6 (six) hours as needed (headache/pain).    . Aspirin-Salicylamide-Caffeine (BC HEADACHE POWDER PO) Take 1 packet by mouth daily as needed (headaches).    . benztropine (COGENTIN) 1 MG tablet Take 1 mg by mouth 2 (two) times daily.    Marland Kitchen esomeprazole (NEXIUM) 20 MG capsule Take 20 mg by mouth daily at 12 noon.    . gabapentin (NEURONTIN) 400 MG capsule Take 1,200 mg by mouth 4 (four) times daily.  2  . ibuprofen (ADVIL,MOTRIN) 800 MG tablet Take 800 mg by mouth every 8 (eight) hours as needed for moderate pain.     Marland Kitchen lamoTRIgine (LAMICTAL) 150 MG tablet Take 150 mg by mouth daily.    . naproxen sodium (ANAPROX DS) 550 MG tablet Take 1 tablet (550 mg total) by mouth 2 (two) times daily with a meal. (Patient taking differently: Take 550 mg by mouth 2 (two) times daily as needed for moderate pain. ) 30 tablet 1  . PROPRANOLOL HCL PO Take 30 mg by mouth 2 (two) times daily.    . risperiDONE (RISPERDAL) 3 MG tablet Take 3 mg by mouth daily. 3 mg q am and 6 mg q pm  2  . SUMAtriptan (IMITREX) 6 MG/0.5ML SOLN injection Inject 0.5 mLs (6 mg total) into the skin every 2 (two) hours as needed for migraine or headache. May repeat in 2 hours if headache persists or recurs. 12 vial 11  . Vortioxetine HBr (BRINTELLIX) 20 MG TABS Take 20 mg by mouth daily.      No current facility-administered medications for this visit.     PAST MEDICAL HISTORY: Past Medical  History:  Diagnosis Date  . Anxiety   . Bipolar affective psychosis ([place])   . Depression   . GERD (gastroesophageal reflux disease)   . Migraine     PAST SURGICAL HISTORY: Past Surgical History:  Procedure Laterality Date  . CARPAL TUNNEL RELEASE    . CHOLECYSTECTOMY    . GANGLION CYST EXCISION     RIGHT wrist  . KNEE ARTHROSCOPY WITH ANTERIOR CRUCIATE LIGAMENT (ACL) REPAIR Right 10/2015    FAMILY HISTORY: Family History  Problem Relation Age of Onset  . Depression Mother   . Alcohol abuse Father   . Crohn's disease Sister   . Stroke Brother 80  . Migraines Daughter   . GER disease Daughter   . Migraines Daughter   . GER disease Daughter     SOCIAL HISTORY:  Social History   Social History  . Marital status: Divorced    Spouse name: n/a  . Number of children: 2  . Years of education: 52   Occupational History  . Disabled     Formerly  a Pharmacist, hospital.   Social History Main Topics  . Smoking status: Current Every Day Smoker    Packs/day: 0.25    Years: 33.00    Types: Cigarettes    Start date: 10/10/1981  . Smokeless tobacco: Never Used  . Alcohol use 0.0 oz/week     Comment: occasionally  . Drug use: No  . Sexual activity: Not Currently    Partners: Male    Birth control/ protection: None   Other Topics Concern  . Not on file   Social History Narrative   Lives at home with her daughter.   Right-handed.   2-4 cups caffeine daily.     PHYSICAL EXAM   Vitals:   10/02/16 1157  BP: 105/64  Pulse: (!) 54  Weight: 156 lb (70.8 kg)  Height: 5\' 2"  (1.575 m)    Not recorded      Body mass index is 28.53 kg/m.  PHYSICAL EXAMNIATION:  Gen: NAD, conversant, well nourised, obese, well groomed                     Cardiovascular: Regular rate rhythm, no peripheral edema, warm, nontender. Eyes: Conjunctivae clear without exudates or hemorrhage Neck: Supple, no carotid bruise. Pulmonary: Clear to auscultation bilaterally   NEUROLOGICAL  EXAM:  MENTAL STATUS: Speech:    Speech is normal; fluent and spontaneous with normal comprehension.  Cognition:     Orientation to time, place and person     Normal recent and remote memory     Normal Attention span and concentration     Normal Language, naming, repeating,spontaneous speech     Fund of knowledge   CRANIAL NERVES: CN II: Visual fields are full to confrontation. Fundoscopic exam is normal with sharp discs. Pupils are round equal and briskly reactive to light. CN III, IV, VI: extraocular movement are normal. No ptosis. CN V: Facial sensation is intact to pinprick in all 3 divisions bilaterally. Corneal responses are intact.  CN VII: Face is symmetric with normal eye closure and smile. CN VIII: Hearing is normal to rubbing fingers CN IX, X: Palate elevates symmetrically. Phonation is normal. CN XI: Head turning and shoulder shrug are intact CN XII: Tongue is midline with normal movements and no atrophy.  MOTOR: There is no pronator drift of out-stretched arms. Muscle bulk and tone are normal. Muscle strength is normal.  REFLEXES: Reflexes are 2+ and symmetric at the biceps, triceps, knees, and ankles. Plantar responses are flexor.  SENSORY: Intact to light touch, pinprick, position sense, and vibration sense are intact in fingers and toes.  COORDINATION: Rapid alternating movements and fine finger movements are intact. There is no dysmetria on finger-to-nose and heel-knee-shin.    GAIT/STANCE: Posture is normal. Gait is steady with normal steps, base, arm swing, and turning. Heel and toe walking are normal. Tandem gait is normal.  Romberg is absent.   DIAGNOSTIC DATA (LABS, IMAGING, TESTING) - I reviewed patient records, labs, notes, testing and imaging myself where available.   ASSESSMENT AND PLAN  Lauren Chapman is a 46 y.o. female   Chronic migraine with aura  She is on polypharmacy already for her bipolar disorder  Maxalt dissolvable 10 mg as the  needed only provide mild to moderate inconsistent help   will change to Imitrex injection, along with naproxen 500 mg as needed, Phenergan 25 mg, tizanidine as needed for more severe intractable headaches  Continue Botox injection as migraine prevention   Bipolar disorder   On polypharmacy  treatment  Marcial Pacas, M.D. Ph.D.  [facility] [address], [place], [place] [address] Ph: [phone] Fax: [phone]  CC: Bettey Costa, NP

## 2016-10-19 DIAGNOSIS — F250 Schizoaffective disorder, bipolar type: Secondary | ICD-10-CM | POA: Diagnosis not present

## 2016-10-19 DIAGNOSIS — F3130 Bipolar disorder, current episode depressed, mild or moderate severity, unspecified: Secondary | ICD-10-CM | POA: Diagnosis not present

## 2016-11-03 ENCOUNTER — Ambulatory Visit: Payer: Medicare Other | Admitting: Physical Medicine & Rehabilitation

## 2016-11-07 DIAGNOSIS — Z23 Encounter for immunization: Secondary | ICD-10-CM | POA: Diagnosis not present

## 2016-11-08 ENCOUNTER — Encounter: Payer: Medicare Other | Attending: Physical Medicine & Rehabilitation | Admitting: Physical Medicine & Rehabilitation

## 2016-11-08 ENCOUNTER — Encounter: Payer: Self-pay | Admitting: Physical Medicine & Rehabilitation

## 2016-11-08 VITALS — BP 96/68 | HR 54 | Resp 14

## 2016-11-08 DIAGNOSIS — R51 Headache: Secondary | ICD-10-CM | POA: Diagnosis not present

## 2016-11-08 DIAGNOSIS — IMO0002 Reserved for concepts with insufficient information to code with codable children: Secondary | ICD-10-CM

## 2016-11-08 DIAGNOSIS — G43709 Chronic migraine without aura, not intractable, without status migrainosus: Secondary | ICD-10-CM | POA: Diagnosis not present

## 2016-11-08 NOTE — Progress Notes (Signed)
Botox Injection for chronic migraine headaches ICD 10: G43.709   Dilution: 100 Units/ 56ml preservative free NS x2 Indication: refractory headaches (At least 15 days per month/headache lasting greater than 4 hours per day) incompletely responsive to other more conservative measures.  Informed consent was obtained after describing risks and benefits of the procedure with the patient. This includes bleeding, bruising, infection, excessive weakness, or medication side effects. A REMS form is on file and signed. Needle: 30g 1/2 inch needle  Last injection 08/04/16. Since last injection patient notes continued improvement with severity of headaches.    Number of units per muscle:  Right temporalis 20 units, 4 access points Left temporalis 40 units,  4 access points Right frontalis 10 units, 2 access points Left frontalis 10 units, 2 access points Procerus 5 units, 1 access point Right corrugator 5 units, 1 access point Left corrugator 5 units, 1 access point Right occipitalis 15 units, 3 access points Left occipitalis 15 units, 3 access points Right cervical paraspinal 10 units, 2 access points Left cervical paraspinals 10 units, 2 access points  Right trapezius 15 units, 3 access points Left trapezius 15 units, 3 access points   Remaining 25 units were discarded.  All injections were done after  after negative drawback for blood. The patient tolerated the procedure well. Post procedure instructions were given. A followup appointment was made.  RTC 3 months

## 2016-11-14 DIAGNOSIS — F250 Schizoaffective disorder, bipolar type: Secondary | ICD-10-CM | POA: Diagnosis not present

## 2016-11-22 DIAGNOSIS — F429 Obsessive-compulsive disorder, unspecified: Secondary | ICD-10-CM | POA: Diagnosis not present

## 2016-11-22 DIAGNOSIS — F3130 Bipolar disorder, current episode depressed, mild or moderate severity, unspecified: Secondary | ICD-10-CM | POA: Diagnosis not present

## 2016-11-27 DIAGNOSIS — Z1231 Encounter for screening mammogram for malignant neoplasm of breast: Secondary | ICD-10-CM | POA: Diagnosis not present

## 2016-12-05 DIAGNOSIS — F3130 Bipolar disorder, current episode depressed, mild or moderate severity, unspecified: Secondary | ICD-10-CM | POA: Diagnosis not present

## 2016-12-05 DIAGNOSIS — F250 Schizoaffective disorder, bipolar type: Secondary | ICD-10-CM | POA: Diagnosis not present

## 2016-12-07 ENCOUNTER — Ambulatory Visit (HOSPITAL_COMMUNITY): Payer: Medicare Other

## 2016-12-11 ENCOUNTER — Other Ambulatory Visit (HOSPITAL_COMMUNITY): Payer: Medicare Other | Attending: Psychiatry | Admitting: Licensed Clinical Social Worker

## 2016-12-11 ENCOUNTER — Encounter (INDEPENDENT_AMBULATORY_CARE_PROVIDER_SITE_OTHER): Payer: Self-pay

## 2016-12-11 DIAGNOSIS — F259 Schizoaffective disorder, unspecified: Secondary | ICD-10-CM | POA: Diagnosis not present

## 2016-12-11 DIAGNOSIS — Z79899 Other long term (current) drug therapy: Secondary | ICD-10-CM | POA: Diagnosis not present

## 2016-12-13 DIAGNOSIS — F251 Schizoaffective disorder, depressive type: Secondary | ICD-10-CM | POA: Diagnosis not present
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
## 2016-12-15 DIAGNOSIS — F251 Schizoaffective disorder, depressive type: Secondary | ICD-10-CM | POA: Diagnosis not present

## 2016-12-18 DIAGNOSIS — F251 Schizoaffective disorder, depressive type: Secondary | ICD-10-CM | POA: Diagnosis not present

## 2016-12-19 DIAGNOSIS — F251 Schizoaffective disorder, depressive type: Secondary | ICD-10-CM | POA: Diagnosis not present

## 2016-12-20 DIAGNOSIS — F251 Schizoaffective disorder, depressive type: Secondary | ICD-10-CM | POA: Diagnosis not present

## 2016-12-21 DIAGNOSIS — F251 Schizoaffective disorder, depressive type: Secondary | ICD-10-CM | POA: Diagnosis not present

## 2016-12-22 DIAGNOSIS — F251 Schizoaffective disorder, depressive type: Secondary | ICD-10-CM | POA: Diagnosis not present

## 2016-12-25 DIAGNOSIS — F251 Schizoaffective disorder, depressive type: Secondary | ICD-10-CM | POA: Diagnosis not present

## 2016-12-26 DIAGNOSIS — F251 Schizoaffective disorder, depressive type: Secondary | ICD-10-CM | POA: Diagnosis not present

## 2016-12-27 DIAGNOSIS — F251 Schizoaffective disorder, depressive type: Secondary | ICD-10-CM | POA: Diagnosis not present

## 2016-12-28 DIAGNOSIS — F251 Schizoaffective disorder, depressive type: Secondary | ICD-10-CM | POA: Diagnosis not present

## 2016-12-29 DIAGNOSIS — F251 Schizoaffective disorder, depressive type: Secondary | ICD-10-CM | POA: Diagnosis not present

## 2017-01-02 DIAGNOSIS — F251 Schizoaffective disorder, depressive type: Secondary | ICD-10-CM | POA: Diagnosis not present

## 2017-01-03 DIAGNOSIS — F251 Schizoaffective disorder, depressive type: Secondary | ICD-10-CM | POA: Diagnosis not present

## 2017-01-18 ENCOUNTER — Ambulatory Visit: Payer: Medicare Other | Admitting: Physician Assistant

## 2017-01-18 ENCOUNTER — Telehealth: Payer: Self-pay | Admitting: Family Medicine

## 2017-01-18 NOTE — Telephone Encounter (Signed)
Called pt to remind her of appt with [place] tomorrow 01/19/17. I could not leave message per [facility]  Thanks!

## 2017-01-19 ENCOUNTER — Ambulatory Visit (INDEPENDENT_AMBULATORY_CARE_PROVIDER_SITE_OTHER): Payer: Medicare Other | Admitting: Family Medicine

## 2017-01-19 ENCOUNTER — Encounter: Payer: Self-pay | Admitting: Family Medicine

## 2017-01-19 VITALS — BP 89/60 | HR 86 | Temp 98.3°F | Ht 64.0 in | Wt 155.2 lb

## 2017-01-19 DIAGNOSIS — I952 Hypotension due to drugs: Secondary | ICD-10-CM | POA: Diagnosis not present

## 2017-01-19 DIAGNOSIS — R1310 Dysphagia, unspecified: Secondary | ICD-10-CM | POA: Diagnosis not present

## 2017-01-19 DIAGNOSIS — H05253 Intermittent exophthalmos, bilateral: Secondary | ICD-10-CM

## 2017-01-19 NOTE — Progress Notes (Signed)
12/28/20183:32 PM  HUXLEY VANWAGONER 10/12/1970, 46 y.o. female [id number]  Chief Complaint  Patient presents with  . Ptosis    3 MONTHS, STARTING TO CAUSE VISION IMPAIRMENT.    Patient Care Team: Bettey Costa, NP as PCP - General (Nurse Practitioner) [facility] Dr Marcial Pacas  HPI:   Patient is a 46 y.o. female who presents today for drooping eyelids for the past several months. She reports symptoms are intermittent, tends to be worse at end of the day. She states that sometimes her eyelids are so low that they interfere with her vision. She also reports she tires easily chewing and has been having problems with swallowing solids. Does ok with liquids. She has not noticed any extremity weakness. She has some minimal SOB going up stairs but that has been present for much longer and she believes it is related to her smoking. She denies any other family members with similar issues.   She was not having these symptoms when she last saw he neurologist. She denies any new meds prior to symptoms arising.  Depression screen [facility] 2/9 01/19/2017 08/17/2015 04/29/2015  Decreased Interest 0 0 2  Down, Depressed, Hopeless 0 0 3  PHQ - 2 Score 0 0 5  Altered sleeping - - 1  Tired, decreased energy - - 1  Change in appetite - - 0  Feeling bad or failure about yourself  - - 3  Trouble concentrating - - 2  Moving slowly or fidgety/restless - - 0  Suicidal thoughts - - 2  PHQ-9 Score - - 14  Difficult doing work/chores - - Somewhat difficult    Allergies  Allergen Reactions  . Codeine Anaphylaxis    Anything with codeine    Prior to Admission medications   Medication Sig Start Date End Date Taking? Authorizing Provider  benztropine (COGENTIN) 1 MG tablet Take 1 mg by mouth 2 (two) times daily.   Yes [provider]  esomeprazole (NEXIUM) 20 MG capsule Take 20 mg by mouth daily at 12 noon.   Yes [provider]  gabapentin (NEURONTIN) 400 MG capsule Take 1,200 mg  by mouth 4 (four) times daily. 01/13/15  Yes [provider]  ibuprofen (ADVIL,MOTRIN) 800 MG tablet Take 800 mg by mouth every 8 (eight) hours as needed for moderate pain.    Yes [provider]  lamoTRIgine (LAMICTAL) 150 MG tablet Take 150 mg by mouth daily.   Yes [provider]  ondansetron (ZOFRAN ODT) 4 MG disintegrating tablet Take 1 tablet (4 mg total) by mouth every 8 (eight) hours as needed. 10/02/16  Yes Marcial Pacas, MD  PROPRANOLOL HCL PO Take 30 mg by mouth 2 (two) times daily.   Yes [provider]  risperiDONE (RISPERDAL) 3 MG tablet Take 3 mg by mouth daily. 3 mg q am and 6 mg q pm 01/13/15  Yes [provider]  rizatriptan (MAXALT-MLT) 10 MG disintegrating tablet Take 1 tablet (10 mg total) by mouth as needed. May repeat in 2 hours if needed 10/02/16  Yes Marcial Pacas, MD  SUMAtriptan (IMITREX) 6 MG/0.5ML SOLN injection Inject 0.5 mLs (6 mg total) into the skin every 2 (two) hours as needed for migraine or headache. May repeat in 2 hours if headache persists or recurs. 03/08/15  Yes Marcial Pacas, MD  tiZANidine (ZANAFLEX) 4 MG tablet Take 1 tablet (4 mg total) by mouth every 6 (six) hours as needed for muscle spasms. 10/02/16  Yes Marcial Pacas, MD  Vilazodone HCl (VIIBRYD) 20 MG TABS Take 20 mg by mouth daily.   Yes [provider]    Past Medical History:  Diagnosis Date  . Anxiety   . Bipolar affective psychosis ([place])   . Depression   . GERD (gastroesophageal reflux disease)   . Migraine     Past Surgical History:  Procedure Laterality Date  . CARPAL TUNNEL RELEASE    . CHOLECYSTECTOMY    . GANGLION CYST EXCISION     RIGHT wrist  . KNEE ARTHROSCOPY WITH ANTERIOR CRUCIATE LIGAMENT (ACL) REPAIR Right 10/2015    Social History   Tobacco Use  . Smoking status: Current Every Day Smoker    Packs/day: 0.25    Years: 33.00    Pack years: 8.25    Types: Cigarettes    Start date: 10/10/1981  . Smokeless tobacco: Never Used    Substance Use Topics  . Alcohol use: Yes    Alcohol/week: 0.0 oz    Comment: occasionally    Family History  Problem Relation Age of Onset  . Depression Mother   . Alcohol abuse Father   . Crohn's disease Sister   . Stroke Brother 38  . Migraines Daughter   . GER disease Daughter   . Migraines Daughter   . GER disease Daughter     Review of Systems  Constitutional: Positive for malaise/fatigue. Negative for chills and fever.  Respiratory: Positive for shortness of breath. Negative for cough.   Cardiovascular: Negative for chest pain, palpitations and leg swelling.  Gastrointestinal: Negative for abdominal pain, heartburn, nausea and vomiting.  Neurological: Negative for dizziness, tingling, speech change and focal weakness.    OBJECTIVE:  Blood pressure (!) 89/60, pulse 86, temperature 98.3 F (36.8 C), temperature source Oral, height 5\' 4"  (1.626 m), weight 155 lb 3.2 oz (70.4 kg), SpO2 98 %.  BP Readings from Last 3 Encounters:  01/19/17 (!) 89/60  11/08/16 96/68  10/02/16 105/64   Wt Readings from Last 3 Encounters:  01/19/17 155 lb 3.2 oz (70.4 kg)  10/02/16 156 lb (70.8 kg)  03/16/16 160 lb (72.6 kg)    Physical Exam  Constitutional: She is oriented to person, place, and time and well-developed, well-nourished, and in no distress.  HENT:  Head: Normocephalic and atraumatic.  Right Ear: Hearing, tympanic membrane, external ear and ear canal normal.  Left Ear: Hearing, tympanic membrane, external ear and ear canal normal.  Mouth/Throat: Oropharynx is clear and moist.  Eyes: EOM are normal. Pupils are equal, round, and reactive to light.  Neck: Neck supple. No thyromegaly present.  Cardiovascular: Normal rate, regular rhythm, normal heart sounds and intact distal pulses. Exam reveals no gallop and no friction rub.  No murmur heard. Pulmonary/Chest: Effort normal and breath sounds normal. She has no wheezes. She has no rales.  Abdominal: Soft. Bowel sounds are  normal. She exhibits no distension and no mass. There is no tenderness.  Musculoskeletal: Normal range of motion. She exhibits no edema.  Lymphadenopathy:    She has no cervical adenopathy.  Neurological: She is alert and oriented to person, place, and time. She has normal strength, normal reflexes and intact cranial nerves. She has a normal Cerebellar Exam. Gait normal.  Skin: Skin is warm and dry.  Psychiatric: Mood and affect normal.  Nursing note and vitals reviewed.   ASSESSMENT and PLAN  1. Intermittent proptosis, bilateral History concerning for myasthenia gravis. Ordering serological testing. Advised patient to make an appt with neurologist to further discuss concerns.  -  CBC - Comprehensive metabolic panel - TSH - Acetylcholine Receptor Ab, All  2. Dysphagia, unspecified type - CBC - Comprehensive metabolic panel - TSH - Acetylcholine Receptor Ab, All  3. Hypotension due to drugs Patient states related to propranolol which she takes for headaches. She has already started taking 1/2 prescribed dose. Encouraged hydration, monitor bp outpatient. Discuss with neuro. - CBC - Comprehensive metabolic panel - TSH  Return if symptoms worsen or fail to improve.    Rutherford Guys, MD Primary Care at [place], [place] [address] Ph.  [phone] Fax [phone]

## 2017-01-19 NOTE — Patient Instructions (Addendum)
1. Please make appointment with neurologist,     IF you received an x-ray today, you will receive an invoice from [facility]. Please contact [facility] at [phone] with questions or concerns regarding your invoice.   IF you received labwork today, you will receive an invoice from [place] [facility]. Please contact [facility] at [phone] with questions or concerns regarding your invoice.   Our billing staff will not be able to assist you with questions regarding bills from these companies.  You will be contacted with the lab results as soon as they are available. The fastest way to get your results is to activate your My Chart account. Instructions are located on the last page of this paperwork. If you have not heard from [place] regarding the results in 2 weeks, please contact this office.

## 2017-01-24 DIAGNOSIS — H04123 Dry eye syndrome of bilateral lacrimal glands: Secondary | ICD-10-CM | POA: Diagnosis not present

## 2017-01-24 DIAGNOSIS — H43811 Vitreous degeneration, right eye: Secondary | ICD-10-CM | POA: Diagnosis not present

## 2017-01-24 DIAGNOSIS — H43392 Other vitreous opacities, left eye: Secondary | ICD-10-CM | POA: Diagnosis not present

## 2017-01-24 DIAGNOSIS — H4423 Degenerative myopia, bilateral: Secondary | ICD-10-CM | POA: Diagnosis not present

## 2017-01-25 ENCOUNTER — Encounter: Payer: Self-pay | Admitting: Neurology

## 2017-01-25 ENCOUNTER — Telehealth: Payer: Self-pay | Admitting: Neurology

## 2017-01-25 ENCOUNTER — Ambulatory Visit (INDEPENDENT_AMBULATORY_CARE_PROVIDER_SITE_OTHER): Payer: Medicare Other | Admitting: Neurology

## 2017-01-25 VITALS — BP 100/66 | HR 55 | Ht 64.0 in | Wt 153.0 lb

## 2017-01-25 DIAGNOSIS — R531 Weakness: Secondary | ICD-10-CM

## 2017-01-25 MED ORDER — ERENUMAB-AOOE 70 MG/ML ~~LOC~~ SOAJ: 70.0000 mg | SUBCUTANEOUS | 11 refills | Status: DC

## 2017-01-25 NOTE — Progress Notes (Signed)
Chief Complaint  Patient presents with  . Evaluation for Myasthenia Gravis    She is here with her mother, Leda Gauze. Reports bilateral eyelid drooping. neck weakness, difficulty swallowing and fatigue.  Symptoms have been progressing over the last two months.    . Migraine    She is still getting Botox injections with Dr. Posey Pronto.   Chief Complaint  Patient presents with  . Evaluation for Myasthenia Gravis    She is here with her mother, Leda Gauze. Reports bilateral eyelid drooping. neck weakness, difficulty swallowing and fatigue.  Symptoms have been progressing over the last two months.    . Migraine    She is still getting Botox injections with Dr. Posey Pronto.      PATIENT: Lauren Chapman DOB: 1970/03/17  Chief Complaint  Patient presents with  . Evaluation for Myasthenia Gravis    She is here with her mother, Leda Gauze. Reports bilateral eyelid drooping. neck weakness, difficulty swallowing and fatigue.  Symptoms have been progressing over the last two months.    . Migraine    She is still getting Botox injections with Dr. Posey Pronto.     HISTORICAL  IVET GUERRIERI is a 47 year old right-handed female, seen in refer by her primary care Bettey Costa, NP for evaluation of migraine in October 19 2014  I reviewed and summarized her office visit, reviewed laboratory in July 2016, mild elevated triglycerides 208,  She has a history of bipolar affective disorder, is on polypharmacy treatment, this including lamotrigine, Zoloft, Brintellix, Valium, Cogentin, gabapentin.  She started to have migraine since 47 years old, her typical migraine are lateralized severe pounding headache was associated light noise sensitivity, lasting 1-2 days, she often has alternating of sound, visual disturbance before the onset of migraine, and during severe headaches. over the years, she has had at least twice migraine each week, she has been taking frequent goody powders, which does not work anymore, now she take  over-the-counter ibuprofen and Aleve, up to 10 tablets each day to try to get rid of headache with suboptimal response. She was giving a prescription of Imitrex upon why ER presentation for her migraines, which does not work well, she reported loose control of her bowel movements after taking Imitrex.   She was not able to identify trigger for her migraine, she does couple days each week because of her severe headaches, she has never tried any preventive medications in the past  UPDATE Dec 22 2014: She is now taking Inderal 60 mg twice a day, tolerating medication well, she did see mild to moderate improvement of her headache instead of twice week, she is now having her typical migraine once a week, she has left-sided retro-orbital area severe pounding headache with associated light noise sensitivity, nauseous, it can last for a few days, Maxalt works sometimes, but she has to repeat multiple times, sometimes she go to hospital for injection, she has tried migraine cocktail years, Compazine, Benadryl, Toradol with limited help, sometimes she has to use Demerol about once a year for migraine  UPDATE Mar 08 2015: She did have Botox injection as migraine prevention by rehabilitation physician Dr. Posey Pronto February 04 2015, which has helped her headache, she has less severe, less frequent headaches, couple times each week, she is now receiving naproxen 1-2 times each week for mild to moderate headaches, use Imitrex injection for more severe headaches, which works well for her,  UPDATE Oct 02 2016: She is using imitrex prn once a month, for severe migraine headaches,  which did help her headaches, continue Botox injection by rehabilitation physician Dr. Posey Pronto, she reported significant improvement with Botox injection, she is now on lower dose of propanolol 30 mg twice a day, higher dose cause decreased blood pressure  She reported taking Excedrin Migraine about every other day for mild to moderate  headaches  UPDATE Jan 25 2017: Laboratory evaluation on January 19, 2017, acetylcholine receptor antibody was negative, normal TSH, CBC, CMP.  She is receiving BOTOX injection every 3 months from rehab physician Dr. Posey Pronto,  most recent injection was on November 08 2016, which has helped her migraine   She still has migraine 1-2 a week, using maxalt, zofran, tizanidine prn, twice a week,   She has develeoped droopy eyelid since June 2018, to the point of covering her vision sometimes, she also complains of chewing difficulty, but denies double vision, no significant limb muscle weakness.   REVIEW OF SYSTEMS: Full 14 system review of systems performed and notable only for: Light sensitivity, shortness of breath, diarrhea, incontinence of bowels, incontinence of bladder, dizziness, headaches, passing out, depression, anxiety hallucinations, suicidal thoughts  ALLERGIES: Allergies  Allergen Reactions  . Codeine Anaphylaxis    Anything with codeine    HOME MEDICATIONS: Current Outpatient Medications  Medication Sig Dispense Refill  . benztropine (COGENTIN) 1 MG tablet Take 1 mg by mouth 2 (two) times daily.    Marland Kitchen esomeprazole (NEXIUM) 20 MG capsule Take 20 mg by mouth daily at 12 noon.    . gabapentin (NEURONTIN) 400 MG capsule Take 1,200 mg by mouth 4 (four) times daily.  2  . lamoTRIgine (LAMICTAL) 150 MG tablet Take 150 mg by mouth daily.    . ondansetron (ZOFRAN ODT) 4 MG disintegrating tablet Take 1 tablet (4 mg total) by mouth every 8 (eight) hours as needed. 20 tablet 6  . PROPRANOLOL HCL PO Take 30 mg by mouth 2 (two) times daily.    . risperiDONE (RISPERDAL) 3 MG tablet Take 3 mg by mouth daily. 3 mg q am and 6 mg q pm  2  . rizatriptan (MAXALT-MLT) 10 MG disintegrating tablet Take 1 tablet (10 mg total) by mouth as needed. May repeat in 2 hours if needed 15 tablet 6  . SUMAtriptan (IMITREX) 6 MG/0.5ML SOLN injection Inject 0.5 mLs (6 mg total) into the skin every 2 (two) hours as  needed for migraine or headache. May repeat in 2 hours if headache persists or recurs. 12 vial 11  . tiZANidine (ZANAFLEX) 4 MG tablet Take 1 tablet (4 mg total) by mouth every 6 (six) hours as needed for muscle spasms. 30 tablet 0  . Vilazodone HCl (VIIBRYD) 20 MG TABS Take 20 mg by mouth daily.     No current facility-administered medications for this visit.     PAST MEDICAL HISTORY: Past Medical History:  Diagnosis Date  . Anxiety   . Bipolar affective psychosis ([place])   . Depression   . GERD (gastroesophageal reflux disease)   . Migraine     PAST SURGICAL HISTORY: Past Surgical History:  Procedure Laterality Date  . CARPAL TUNNEL RELEASE    . CHOLECYSTECTOMY    . GANGLION CYST EXCISION     RIGHT wrist  . KNEE ARTHROSCOPY WITH ANTERIOR CRUCIATE LIGAMENT (ACL) REPAIR Right 10/2015    FAMILY HISTORY: Family History  Problem Relation Age of Onset  . Depression Mother   . Alcohol abuse Father   . Crohn's disease Sister   . Stroke Brother 67  . Migraines  Daughter   . GER disease Daughter   . Migraines Daughter   . GER disease Daughter     SOCIAL HISTORY:  Social History   Socioeconomic History  . Marital status: Divorced    Spouse name: n/a  . Number of children: 2  . Years of education: 46  . Highest education level: Not on file  Social Needs  . Financial resource strain: Not on file  . Food insecurity - worry: Not on file  . Food insecurity - inability: Not on file  . Transportation needs - medical: Not on file  . Transportation needs - non-medical: Not on file  Occupational History  . Occupation: Disabled    Comment: Formerly a Pharmacist, hospital.  Tobacco Use  . Smoking status: Current Every Day Smoker    Packs/day: 0.25    Years: 33.00    Pack years: 8.25    Types: Cigarettes    Start date: 10/10/1981  . Smokeless tobacco: Never Used  Substance and Sexual Activity  . Alcohol use: Yes    Alcohol/week: 0.0 oz    Comment: occasionally  . Drug use: No  .  Sexual activity: Not Currently    Partners: Male    Birth control/protection: None  Other Topics Concern  . Not on file  Social History Narrative   Lives at home with her daughter.   Right-handed.   2-4 cups caffeine daily.     PHYSICAL EXAM   Vitals:   01/25/17 1332  BP: 100/66  Pulse: (!) 55  Weight: 153 lb (69.4 kg)  Height: 5\' 4"  (1.626 m)    Not recorded      Body mass index is 26.26 kg/m.  PHYSICAL EXAMNIATION:  Gen: NAD, conversant, well nourised, obese, well groomed                     Cardiovascular: Regular rate rhythm, no peripheral edema, warm, nontender. Eyes: Conjunctivae clear without exudates or hemorrhage Neck: Supple, no carotid bruise. Pulmonary: Clear to auscultation bilaterally   NEUROLOGICAL EXAM:  MENTAL STATUS: Speech:    Speech is normal; fluent and spontaneous with normal comprehension.  Cognition:     Orientation to time, place and person     Normal recent and remote memory     Normal Attention span and concentration     Normal Language, naming, repeating,spontaneous speech     Fund of knowledge   CRANIAL NERVES: CN II: Visual fields are full to confrontation. Fundoscopic exam is normal with sharp discs. Pupils are round equal and briskly reactive to light. CN III, IV, VI: extraocular movement are normal. No ptosis. CN V: Facial sensation is intact to pinprick in all 3 divisions bilaterally. Corneal responses are intact.  CN VII: Face is symmetric with normal eye closure and smile. CN VIII: Hearing is normal to rubbing fingers CN IX, X: Palate elevates symmetrically. Phonation is normal. CN XI: Head turning and shoulder shrug are intact CN XII: Tongue is midline with normal movements and no atrophy.  MOTOR: Variable effort on motor examination, but there was no significant bulbar, neck flexion, limb muscle weakness noticed.  REFLEXES: Reflexes are 2+ and symmetric at the biceps, triceps, knees, and ankles. Plantar responses are  flexor.  SENSORY: Intact to light touch, pinprick, position sense, and vibration sense are intact in fingers and toes.  COORDINATION: Rapid alternating movements and fine finger movements are intact. There is no dysmetria on finger-to-nose and heel-knee-shin.    GAIT/STANCE: Posture is normal. Gait  is steady with normal steps, base, arm swing, and turning. Heel and toe walking are normal. Tandem gait is normal.  Romberg is absent.   DIAGNOSTIC DATA (LABS, IMAGING, TESTING) - I reviewed patient records, labs, notes, testing and imaging myself where available.   ASSESSMENT AND PLAN  ARIADNE RISSMILLER is a 47 y.o. female   Chronic migraine with aura New-onset generalized weakness, including complains of droopy eyelid, swallowing difficulty,  No objective muscle weakness on examination, variable effort,  She is on polypharmacy already for her bipolar disorder  Has been receiving Botox injection as migraine prevention, last injection was in October 2018, no complaints of muscle weakness, there is no objective findings, acetylcholine receptor antibody was negative, no evidence of neuromuscular junctional disorder.  Hold off Botox injection,  EMG nerve conduction study, MRI of the brain  Marcial Pacas, M.D. Ph.D.  [facility] [address] [place], [place] [address] Ph: [phone] Fax: [phone]  CC: Bettey Costa, NP

## 2017-01-25 NOTE — Telephone Encounter (Signed)
Dr. Posey Pronto:  I saw Lauren Chapman today complaining of droopy eyelid, neck muscle weakness dysphagia.  I do not find significant muscle weakness on my examination, and myasthenia gravis panel was negative.  But I still think that we should stop Botox injection as migraine prevention.  I have prescribed Aimovig 70mg  sq every month as migraine prevention.  Thanks,   Aliene Beams

## 2017-01-26 DIAGNOSIS — F250 Schizoaffective disorder, bipolar type: Secondary | ICD-10-CM | POA: Diagnosis not present

## 2017-01-26 DIAGNOSIS — F3130 Bipolar disorder, current episode depressed, mild or moderate severity, unspecified: Secondary | ICD-10-CM | POA: Diagnosis not present

## 2017-01-29 LAB — COMPREHENSIVE METABOLIC PANEL
ALT: 14 IU/L (ref 0–32)
AST: 16 IU/L (ref 0–40)
Albumin/Globulin Ratio: 1.8 (ref 1.2–2.2)
Albumin: 4.8 g/dL (ref 3.5–5.5)
Alkaline Phosphatase: 78 IU/L (ref 39–117)
BUN/Creatinine Ratio: 8 — ABNORMAL LOW (ref 9–23)
BUN: 7 mg/dL (ref 6–24)
Bilirubin Total: 0.3 mg/dL (ref 0.0–1.2)
CO2: 26 mmol/L (ref 20–29)
Calcium: 9.6 mg/dL (ref 8.7–10.2)
Chloride: 98 mmol/L (ref 96–106)
Creatinine, Ser: 0.9 mg/dL (ref 0.57–1.00)
GFR calc Af Amer: 87 mL/min/{1.73_m2} (ref >59)
GFR calc non Af Amer: 76 mL/min/{1.73_m2} (ref >59)
Globulin, Total: 2.7 g/dL (ref 1.5–4.5)
Glucose: 82 mg/dL (ref 65–99)
Potassium: 3.8 mmol/L (ref 3.5–5.2)
Sodium: 142 mmol/L (ref 134–144)
Total Protein: 7.5 g/dL (ref 6.0–8.5)

## 2017-01-29 LAB — TSH: TSH: 1.03 u[IU]/mL (ref 0.450–4.500)

## 2017-01-29 LAB — CBC
Hematocrit: 41.3 % (ref 34.0–46.6)
Hemoglobin: 14.6 g/dL (ref 11.1–15.9)
MCH: 32.3 pg (ref 26.6–33.0)
MCHC: 35.4 g/dL (ref 31.5–35.7)
MCV: 91 fL (ref 79–97)
Platelets: 258 10*3/uL (ref 150–379)
RBC: 4.52 x10E6/uL (ref 3.77–5.28)
RDW: 12.9 % (ref 12.3–15.4)
WBC: 8.2 10*3/uL (ref 3.4–10.8)

## 2017-01-29 LAB — ACETYLCHOLINE RECEPTOR AB, ALL
AChR Binding Ab, Serum: <0.03 nmol/L (ref 0.00–0.24)
Acetylchol Block Ab: 21 % (ref 0–25)
Acetylcholine Modulat Ab: <12 % (ref 0–20)

## 2017-01-30 ENCOUNTER — Telehealth: Payer: Self-pay | Admitting: Neurology

## 2017-01-30 NOTE — Telephone Encounter (Signed)
Patient calling back stating she needs PA for Erenumab-aooe (AIMOVIG) 70 MG/ML SOAJ not SUMAtriptan (IMITREX) 6 MG/0.5ML SOLN injection.

## 2017-01-30 NOTE — Telephone Encounter (Signed)
Thank you for the followup.

## 2017-01-30 NOTE — Telephone Encounter (Signed)
Pharmacy is requesting a PA for SUMAtriptan (IMITREX) 6 MG/0.5ML SOLN injection

## 2017-01-30 NOTE — Telephone Encounter (Signed)
PA initiated through covermymeds with [employer] [phone]).  Pt JQ#[id number].  I returned call to patient to notify her that the decision is pending.  Decision should be made within 72 business hours.

## 2017-02-01 ENCOUNTER — Telehealth: Payer: Self-pay | Admitting: *Deleted

## 2017-02-01 NOTE — Telephone Encounter (Signed)
After attempting a prior authorization, it was determined Aimovig is not covered by her insurance plan.  She will complete the [facility] application and return it to our office for patient assistance (attention:  Rance Muir).

## 2017-02-03 ENCOUNTER — Other Ambulatory Visit: Payer: Self-pay

## 2017-02-06 ENCOUNTER — Ambulatory Visit
Admission: RE | Admit: 2017-02-06 | Discharge: 2017-02-06 | Disposition: A | Discharge status: Home / Self Care | Payer: Medicare Other | Source: Ambulatory Visit | Attending: Neurology | Admitting: Neurology

## 2017-02-06 DIAGNOSIS — R531 Weakness: Secondary | ICD-10-CM

## 2017-02-08 ENCOUNTER — Encounter: Payer: Medicare Other | Attending: Physical Medicine & Rehabilitation | Admitting: Physical Medicine & Rehabilitation

## 2017-02-08 ENCOUNTER — Encounter: Payer: Self-pay | Admitting: Physical Medicine & Rehabilitation

## 2017-02-08 VITALS — BP 91/61 | HR 62 | Resp 14

## 2017-02-08 DIAGNOSIS — R51 Headache: Secondary | ICD-10-CM | POA: Diagnosis not present

## 2017-02-08 DIAGNOSIS — F250 Schizoaffective disorder, bipolar type: Secondary | ICD-10-CM | POA: Diagnosis not present

## 2017-02-08 DIAGNOSIS — G43709 Chronic migraine without aura, not intractable, without status migrainosus: Secondary | ICD-10-CM

## 2017-02-08 DIAGNOSIS — IMO0002 Reserved for concepts with insufficient information to code with codable children: Secondary | ICD-10-CM

## 2017-02-08 NOTE — Progress Notes (Signed)
Subjective:    Patient ID: Lauren Chapman, female    DOB: [birth year], 47 y.o.   MRN: [id number]  HPI  47 y/o female presents for follow up after Botox for migraines. Since last injection she notes neck pain, ptosis, and some difficulty swallowing.  She been receiving Botox for ~2 years.  She had improvements with migraines.  She was prescribed aimovig, but has not tried it yet.  She is not noticing improvement with symptoms.  She is being workup by Neurology.  She had an MRI recently.  She is scheduled for NCS/EMG tomorrow.    Pain Inventory Average Pain 1 Pain Right Now 4 My pain is intermittent, dull and stabbing  In the last 24 hours, has pain interfered with the following? General activity 0 Relation with others 0 Enjoyment of life 0 What TIME of day is your pain at its worst? daytime and evening Sleep (in general) Good  Pain is worse with: bending, standing and some activites Pain improves with: rest, heat/ice, medication and injections Relief from Meds: 5  Mobility walk without assistance Do you have any goals in this area?  no  Function Do you have any goals in this area?  no  Neuro/Psych depression anxiety suicidal thoughts  Prior Studies Any changes since last visit?  no  Physicians involved in your care Any changes since last visit?  no   Family History  Problem Relation Age of Onset  . Depression Mother   . Alcohol abuse Father   . Crohn's disease Sister   . Stroke Brother 103  . Migraines Daughter   . GER disease Daughter   . Migraines Daughter   . GER disease Daughter    Social History   Socioeconomic History  . Marital status: Divorced    Spouse name: n/a  . Number of children: 2  . Years of education: 36  . Highest education level: None  Social Needs  . Financial resource strain: None  . Food insecurity - worry: None  . Food insecurity - inability: None  . Transportation needs - medical: None  . Transportation needs - non-medical:  None  Occupational History  . Occupation: Disabled    Comment: Formerly a Pharmacist, hospital.  Tobacco Use  . Smoking status: Current Every Day Smoker    Packs/day: 0.25    Years: 33.00    Pack years: 8.25    Types: Cigarettes    Start date: 10/10/1981  . Smokeless tobacco: Never Used  Substance and Sexual Activity  . Alcohol use: Yes    Alcohol/week: 0.0 oz    Comment: occasionally  . Drug use: No  . Sexual activity: Not Currently    Partners: Male    Birth control/protection: None  Other Topics Concern  . None  Social History Narrative   Lives at home with her daughter.   Right-handed.   2-4 cups caffeine daily.   Past Surgical History:  Procedure Laterality Date  . CARPAL TUNNEL RELEASE    . CHOLECYSTECTOMY    . GANGLION CYST EXCISION     RIGHT wrist  . KNEE ARTHROSCOPY WITH ANTERIOR CRUCIATE LIGAMENT (ACL) REPAIR Right 10/2015   Past Medical History:  Diagnosis Date  . Anxiety   . Bipolar affective psychosis ([place])   . Depression   . GERD (gastroesophageal reflux disease)   . Migraine    BP 91/61 (BP Location: Left Arm, Patient Position: Sitting, Cuff Size: Normal)   Pulse 62   Resp 14   SpO2 96%  Opioid Risk Score:   Fall Risk Score:  `1  Depression screen PHQ 2/9  Depression screen [facility] 2/9 01/19/2017 08/17/2015 04/29/2015 04/06/2015 03/22/2015 02/04/2015  Decreased Interest 0 0 2 0 0 0  Down, Depressed, Hopeless 0 0 3 0 0 0  PHQ - 2 Score 0 0 5 0 0 0  Altered sleeping - - 1 - - -  Tired, decreased energy - - 1 - - -  Change in appetite - - 0 - - -  Feeling bad or failure about yourself  - - 3 - - -  Trouble concentrating - - 2 - - -  Moving slowly or fidgety/restless - - 0 - - -  Suicidal thoughts - - 2 - - -  PHQ-9 Score - - 14 - - -  Difficult doing work/chores - - Somewhat difficult - - -     Review of Systems  Constitutional: Negative.   HENT: Negative.   Eyes: Positive for photophobia and visual disturbance.  Respiratory: Negative.   Cardiovascular:  Negative.   Gastrointestinal: Positive for diarrhea, nausea and vomiting.  Endocrine: Negative.   Musculoskeletal: Positive for arthralgias.  Skin: Negative.   Allergic/Immunologic: Negative.   Neurological: Positive for headaches.  Hematological: Negative.   Psychiatric/Behavioral: Positive for confusion.       Depression Anxiety  All other systems reviewed and are negative.     Objective:   Physical Exam HENT: Normocephalic, Atraumatic Eyes: EOMI, Conj WNL Cardio: RRR. No JVD. Pulm: B/l clear to auscultation. Effort normal  Abd: Non-distended, BS+ MSK: Gait WNL.  No edema. Neuro: B/l ptosis  Transient nystagmus on left gaze  Sensation intact to light touch in face Skin: Warm and Dry. No rashes.    Assessment & Plan:  46 y/o female presents for follow up after Botox for migraines.  1. Chronic migraines Since the age of 45 1st injection on 02/04/15, last injection 10/17. She had increase in her left temporalis stie on 06/2015.   Since last injection, pt has had dysphagia, increasing ptosis, and neck pain.   She did have improvement in migraines  Will hold off on Botox for the time being  Currently being workup by Neurology - MRI ordered, NCS/EMG ordered  Awaiting insurance approval for [employer]

## 2017-02-09 ENCOUNTER — Ambulatory Visit (INDEPENDENT_AMBULATORY_CARE_PROVIDER_SITE_OTHER): Payer: Medicare Other | Admitting: Neurology

## 2017-02-09 ENCOUNTER — Encounter (INDEPENDENT_AMBULATORY_CARE_PROVIDER_SITE_OTHER): Payer: Medicare Other | Admitting: Neurology

## 2017-02-09 DIAGNOSIS — Z0289 Encounter for other administrative examinations: Secondary | ICD-10-CM

## 2017-02-09 DIAGNOSIS — R531 Weakness: Secondary | ICD-10-CM | POA: Diagnosis not present

## 2017-02-09 NOTE — Procedures (Signed)
Full Name: Lauren Chapman Gender: Female MRN #: [id number] Date of Birth: 2070-03-25    Visit Date: 02/09/17 09:57 Age: 47 Years 64 Months Old Examining Physician: Marcial Pacas, MD  Referring Physician: Krista Blue, MD History: 47 year old female presented with droopy eyelid, generalized weakness,  Summary of the tests:  Nerve conduction study: Left median, ulnar sensory motor responses were normal.  Left superficial, sural sensory responses were normal.  Left peroneal to EDB and tibial motor responses were normal.  Electromyography: Selective needle examination of left upper, lower extremity muscles, left cervical, lumbar sacral paraspinal muscles were normal.   Conclusion:  This is a normal study.  There is no electrodiagnostic evidence of left cervical radiculopathy, lumbosacral radiculopathy, or inflammatory myopathy.   ------------------------------- Marcial Pacas, M.D.  [facility] [place], [place] [address] Tel: [phone] Fax: [phone]        [facility]    Nerve / Sites Muscle Latency Ref. Amplitude Ref. Rel Amp Segments Distance Velocity Ref. Area    ms ms mV mV %  cm m/s m/s mVms  L Median - APB     Wrist APB 3.0 ?4.4 8.0 ?4.0 100 Wrist - APB 7   25.3     Upper arm APB 6.3  7.5  93.6 Upper arm - Wrist 19 57 ?49 22.9  L Ulnar - ADM     Wrist ADM 2.1 ?3.3 9.9 ?6.0 100 Wrist - ADM 7   33.6     B.Elbow ADM 5.5  8.1  81.7 B.Elbow - Wrist 18 54 ?49 28.6     A.Elbow ADM 7.4  8.0  98.6 A.Elbow - B.Elbow 10 51 ?49 29.0         A.Elbow - Wrist      L Peroneal - EDB     Ankle EDB 4.3 ?6.5 6.7 ?2.0 100 Ankle - EDB 9   22.7     Fib head EDB 10.4  5.6  83.5 Fib head - Ankle 29 48 ?44 21.1     Pop fossa EDB 12.3  5.2  92.8 Pop fossa - Fib head 10 51 ?44 20.6         Pop fossa - Ankle      L Tibial - AH     Ankle AH 3.5 ?5.8 15.8 ?4.0 100 Ankle - AH 9   29.6     Pop fossa AH 11.2  11.6  73.5 Pop fossa - Ankle 35 45 ?41 25.5             SNC      Nerve / Sites Rec. Site Peak Lat Ref.  Amp Ref. Segments Distance Peak Diff Ref.    ms ms V V  cm ms ms  L Sural - Ankle (Calf)     Calf Ankle 3.7 ?4.4 12 ?6 Calf - Ankle 14    L Superficial peroneal - Ankle     Lat leg Ankle 3.9 ?4.4 8 ?6 Lat leg - Ankle 14    L Median, Ulnar - Transcarpal comparison     Median Palm Wrist 1.8 ?2.2 96 ?35 Median Palm - Wrist 8       Ulnar Palm Wrist 1.8 ?2.2 24 ?12 Ulnar Palm - Wrist 8          Median Palm - Ulnar Palm  0.0 ?0.4  L Median - Orthodromic (Dig II, Mid palm)     Dig II Wrist 2.6 ?3.4 33 ?10 Dig II -  Wrist 13    L Ulnar - Orthodromic, (Dig V, Mid palm)     Dig V Wrist 2.6 ?3.1 12 ?5 Dig V - Wrist 93                 F  Wave    Nerve F Lat Ref.   ms ms  L Tibial - AH 47.9 ?56.0  L Ulnar - ADM 26.4 ?32.0         EMG full       EMG Summary Table    Spontaneous MUAP Recruitment  Muscle IA Fib PSW Fasc Other Amp Dur. Poly Pattern  L. Tibialis anterior Normal None None None _______ Normal Normal Normal Normal  L. Tibialis posterior Normal None None None _______ Normal Normal Normal Normal  L. Peroneus longus Normal None None None _______ Normal Normal Normal Normal  L. Gastrocnemius (Medial head) Normal None None None _______ Normal Normal Normal Normal  L. Lumbar paraspinals Normal None None None _______ Normal Normal Normal Normal  L. Biceps femoris (long head) Normal None None None _______ Normal Normal Normal Normal  L. Lumbar paraspinals (mid) Normal None None None _______ Normal Normal Normal Normal  L. Lumbar paraspinals (low) Normal None None None _______ Normal Normal Normal Normal  L. First dorsal interosseous Normal None None None _______ Normal Normal Normal Normal  L. Pronator teres Normal None None None _______ Normal Normal Normal Normal  L. Biceps brachii Normal None None None _______ Normal Normal Normal Normal  L. Deltoid Normal None None None _______ Normal Normal Normal Normal  L. Triceps brachii Normal None None  None _______ Normal Normal Normal Normal  L. Extensor digitorum communis Normal None None None _______ Normal Normal Normal Normal  L. Cervical paraspinals Normal None None None _______ Normal Normal Normal Normal

## 2017-02-12 ENCOUNTER — Telehealth: Payer: Self-pay | Admitting: *Deleted

## 2017-02-12 NOTE — Telephone Encounter (Signed)
-----   Message from Marcial Pacas, MD sent at 02/08/2017  6:56 PM EST ----- Please call pt for normal MRI brain.

## 2017-02-12 NOTE — Telephone Encounter (Signed)
Spoke to patient she is aware of the results ?

## 2017-02-22 NOTE — Telephone Encounter (Signed)
Patient brought back her forms I have faxed forms [id number] [phone]

## 2017-03-01 DIAGNOSIS — F3130 Bipolar disorder, current episode depressed, mild or moderate severity, unspecified: Secondary | ICD-10-CM | POA: Diagnosis not present

## 2017-03-22 DIAGNOSIS — F250 Schizoaffective disorder, bipolar type: Secondary | ICD-10-CM | POA: Diagnosis not present

## 2017-04-04 DIAGNOSIS — F250 Schizoaffective disorder, bipolar type: Secondary | ICD-10-CM | POA: Diagnosis not present

## 2017-04-11 ENCOUNTER — Encounter: Payer: Self-pay | Admitting: Physician Assistant

## 2017-04-11 ENCOUNTER — Other Ambulatory Visit: Payer: Self-pay

## 2017-04-11 ENCOUNTER — Ambulatory Visit (INDEPENDENT_AMBULATORY_CARE_PROVIDER_SITE_OTHER): Payer: Medicare Other | Admitting: Physician Assistant

## 2017-04-11 VITALS — BP 109/78 | HR 70 | Temp 98.6°F | Resp 16 | Ht 64.0 in | Wt 154.2 lb

## 2017-04-11 DIAGNOSIS — S0501XA Injury of conjunctiva and corneal abrasion without foreign body, right eye, initial encounter: Secondary | ICD-10-CM | POA: Diagnosis not present

## 2017-04-11 DIAGNOSIS — H53149 Visual discomfort, unspecified: Secondary | ICD-10-CM

## 2017-04-11 DIAGNOSIS — M542 Cervicalgia: Secondary | ICD-10-CM

## 2017-04-11 MED ORDER — CIPROFLOXACIN HCL 0.3 % OP SOLN: OPHTHALMIC | 0 refills | Status: DC

## 2017-04-11 NOTE — Patient Instructions (Addendum)
For eye issues, I have given you a prescription for eye drops. Please pick up and start using now.  Dosing is below: Solution: Instill 2 drops into affected eye every 15 minutes for the first 6 hours, then 2 drops into the affected eye every 30 minutes for the remainder of the first day. On day 2, instill 2 drops into the affected eye hourly.   You have an appointment at Dr. Patrici Ranks today.  Do not wear contact lens until treatment is complete.   For neck pain, this is likely muscular strain. Rest, ice/heat 4-5 x a day for 20 minutes at a time, begin neck stretches below and advance as tolerated.  Return if neck pain persists or worsens.  Cervical Sprain A cervical sprain is a stretch or tear in the tissues that connect bones (ligaments) in the neck. Most neck (cervical) sprains get better in 4-6 weeks. Follow these instructions at home: If you have a neck collar:  Wear it as told by your doctor. Do not take off (do not remove) the collar unless your doctor says that this is safe.  Ask your doctor before adjusting your collar.  If you have long hair, keep it outside of the collar.  Ask your doctor if you may take off the collar for cleaning and bathing. If you may take off the collar: ? Follow instructions from your doctor about how to take off the collar safely. ? Clean the collar by wiping it with mild soap and water. Let it air-dry all the way. ? If your collar has removable pads:  Take the pads out every 1-2 days.  Hand wash the pads with soap and water.  Let the pads air-dry all the way before you put them back in the collar. Do not dry them in a clothes dryer. Do not dry them with a hair dryer. ? Check your skin under the collar for irritation or sores. If you see any, tell your doctor. Managing pain, stiffness, and swelling  Use a cervical traction device, if told by your doctor.  If told, put heat on the affected area. Do this before exercises (physical therapy) or as  often as told by your doctor. Use the heat source that your doctor recommends, such as a moist heat pack or a heating pad. ? Place a towel between your skin and the heat source. ? Leave the heat on for 20-30 minutes. ? Take the heat off (remove the heat) if your skin turns bright red. This is very important if you cannot feel pain, heat, or cold. You may have a greater risk of getting burned.  Put ice on the affected area. ? Put ice in a plastic bag. ? Place a towel between your skin and the bag. ? Leave the ice on for 20 minutes, 2-3 times a day. Activity  Do not drive while wearing a neck collar. If you do not have a neck collar, ask your doctor if it is safe to drive.  Do not drive or use heavy machinery while taking prescription pain medicine or muscle relaxants, unless your doctor approves.  Do not lift anything that is heavier than 10 lb (4.5 kg) until your doctor tells you that it is safe.  Rest as told by your doctor.  Avoid activities that make you feel worse. Ask your doctor what activities are safe for you.  Do exercises as told by your doctor or physical therapist. Preventing neck sprain  Practice good posture. Adjust your workstation  to help with this, if needed.  Exercise regularly as told by your doctor or physical therapist.  Avoid activities that are risky or may cause a neck sprain (cervical sprain). General instructions  Take over-the-counter and prescription medicines only as told by your doctor.  Do not use any products that contain nicotine or tobacco. This includes cigarettes and e-cigarettes. If you need help quitting, ask your doctor.  Keep all follow-up visits as told by your doctor. This is important. Contact a doctor if:  You have pain or other symptoms that get worse.  You have symptoms that do not get better after 2 weeks.  You have pain that does not get better with medicine.  You start to have new, unexplained symptoms.  You have sores or  irritated skin from wearing your neck collar. Get help right away if:  You have very bad pain.  You have any of the following in any part of your body: ? Loss of feeling (numbness). ? Tingling. ? Weakness.  You cannot move a part of your body (you have paralysis).  Your activity level does not improve. Summary  A cervical sprain is a stretch or tear in the tissues that connect bones (ligaments) in the neck.  If you have a neck (cervical) collar, do not take off the collar unless your doctor says that this is safe.  Put ice on affected areas as told by your doctor.  Put heat on affected areas as told by your doctor.  Good posture and regular exercise can help prevent a neck sprain from happening again. This information is not intended to replace advice given to you by your health care provider. Make sure you discuss any questions you have with your health care provider. Document Released: 06/28/2007 Document Revised: 09/21/2015 Document Reviewed: 09/21/2015 Elsevier Interactive Patient Education  2017 [employer] Inc.   Neck Exercises Neck exercises can be important for many reasons:  They can help you to improve and maintain flexibility in your neck. This can be especially important as you age.  They can help to make your neck stronger. This can make movement easier.  They can reduce or prevent neck pain.  They may help your upper back.  Ask your health care provider which neck exercises would be best for you. Exercises Neck Press Repeat this exercise 10 times. Do it first thing in the morning and right before bed or as told by your health care provider. 1. Lie on your back on a firm bed or on the floor with a pillow under your head. 2. Use your neck muscles to push your head down on the pillow and straighten your spine. 3. Hold the position as well as you can. Keep your head facing up and your chin tucked. 4. Slowly count to 5 while holding this position. 5. Relax for a  few seconds. Then repeat.  Isometric Strengthening Do a full set of these exercises 2 times a day or as told by your health care provider. 1. Sit in a supportive chair and place your hand on your forehead. 2. Push forward with your head and neck while pushing back with your hand. Hold for 10 seconds. 3. Relax. Then repeat the exercise 3 times. 4. Next, do thesequence again, this time putting your hand against the back of your head. Use your head and neck to push backward against the hand pressure. 5. Finally, do the same exercise on either side of your head, pushing sideways against the pressure of your  hand.  Prone Head Lifts Repeat this exercise 5 times. Do this 2 times a day or as told by your health care provider. 1. Lie face-down, resting on your elbows so that your chest and upper back are raised. 2. Start with your head facing downward, near your chest. Position your chin either on or near your chest. 3. Slowly lift your head upward. Lift until you are looking straight ahead. Then continue lifting your head as far back as you can stretch. 4. Hold your head up for 5 seconds. Then slowly lower it to your starting position.  Supine Head Lifts Repeat this exercise 8-10 times. Do this 2 times a day or as told by your health care provider. 1. Lie on your back, bending your knees to point to the ceiling and keeping your feet flat on the floor. 2. Lift your head slowly off the floor, raising your chin toward your chest. 3. Hold for 5 seconds. 4. Relax and repeat.  Scapular Retraction Repeat this exercise 5 times. Do this 2 times a day or as told by your health care provider. 1. Stand with your arms at your sides. Look straight ahead. 2. Slowly pull both shoulders backward and downward until you feel a stretch between your shoulder blades in your upper back. 3. Hold for 10-30 seconds. 4. Relax and repeat.  Contact a health care provider if:  Your neck pain or discomfort gets much  worse when you do an exercise.  Your neck pain or discomfort does not improve within 2 hours after you exercise. If you have any of these problems, stop exercising right away. Do not do the exercises again unless your health care provider says that you can. Get help right away if:  You develop sudden, severe neck pain. If this happens, stop exercising right away. Do not do the exercises again unless your health care provider says that you can. Exercises Neck Stretch  Repeat this exercise 3-5 times. 1. Do this exercise while standing or while sitting in a chair. 2. Place your feet flat on the floor, shoulder-width apart. 3. Slowly turn your head to the right. Turn it all the way to the right so you can look over your right shoulder. Do not tilt or tip your head. 4. Hold this position for 10-30 seconds. 5. Slowly turn your head to the left, to look over your left shoulder. 6. Hold this position for 10-30 seconds.  Neck Retraction Repeat this exercise 8-10 times. Do this 3-4 times a day or as told by your health care provider. 1. Do this exercise while standing or while sitting in a sturdy chair. 2. Look straight ahead. Do not bend your neck. 3. Use your fingers to push your chin backward. Do not bend your neck for this movement. Continue to face straight ahead. If you are doing the exercise properly, you will feel a slight sensation in your throat and a stretch at the back of your neck. 4. Hold the stretch for 1-2 seconds. Relax and repeat.  This information is not intended to replace advice given to you by your health care provider. Make sure you discuss any questions you have with your health care provider. Document Released: 12/21/2014 Document Revised: 06/17/2015 Document Reviewed: 07/20/2014 Elsevier Interactive Patient Education  2018 [employer].   IF you received an x-ray today, you will receive an invoice from [facility]. Please contact [facility] at  [phone] with questions or concerns regarding your invoice.   IF you received  labwork today, you will receive an invoice from [facility]. Please contact [facility] at [phone] with questions or concerns regarding your invoice.   Our billing staff will not be able to assist you with questions regarding bills from these companies.  You will be contacted with the lab results as soon as they are available. The fastest way to get your results is to activate your My Chart account. Instructions are located on the last page of this paperwork. If you have not heard from [place] regarding the results in 2 weeks, please contact this office.
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
## 2017-04-13 DIAGNOSIS — S0501XA Injury of conjunctiva and corneal abrasion without foreign body, right eye, initial encounter: Secondary | ICD-10-CM | POA: Diagnosis not present

## 2017-04-14 ENCOUNTER — Encounter: Payer: Self-pay | Admitting: Physician Assistant

## 2017-04-23 DIAGNOSIS — F250 Schizoaffective disorder, bipolar type: Secondary | ICD-10-CM | POA: Diagnosis not present

## 2017-04-24 DIAGNOSIS — F250 Schizoaffective disorder, bipolar type: Secondary | ICD-10-CM | POA: Diagnosis not present

## 2017-04-30 DIAGNOSIS — Z0001 Encounter for general adult medical examination with abnormal findings: Secondary | ICD-10-CM | POA: Diagnosis not present

## 2017-04-30 DIAGNOSIS — F418 Other specified anxiety disorders: Secondary | ICD-10-CM | POA: Diagnosis not present

## 2017-04-30 DIAGNOSIS — Z131 Encounter for screening for diabetes mellitus: Secondary | ICD-10-CM | POA: Diagnosis not present

## 2017-04-30 DIAGNOSIS — Z0000 Encounter for general adult medical examination without abnormal findings: Secondary | ICD-10-CM | POA: Diagnosis not present

## 2017-04-30 DIAGNOSIS — E039 Hypothyroidism, unspecified: Secondary | ICD-10-CM | POA: Diagnosis not present

## 2017-05-08 DIAGNOSIS — F250 Schizoaffective disorder, bipolar type: Secondary | ICD-10-CM | POA: Diagnosis not present

## 2017-05-09 ENCOUNTER — Encounter: Payer: Self-pay | Admitting: Physical Medicine & Rehabilitation

## 2017-05-09 ENCOUNTER — Encounter: Payer: Medicare Other | Attending: Physical Medicine & Rehabilitation | Admitting: Physical Medicine & Rehabilitation

## 2017-05-09 VITALS — BP 108/71 | HR 64 | Resp 14 | Ht 63.0 in | Wt 146.0 lb

## 2017-05-09 DIAGNOSIS — IMO0002 Reserved for concepts with insufficient information to code with codable children: Secondary | ICD-10-CM

## 2017-05-09 DIAGNOSIS — G43709 Chronic migraine without aura, not intractable, without status migrainosus: Secondary | ICD-10-CM

## 2017-05-09 DIAGNOSIS — F250 Schizoaffective disorder, bipolar type: Secondary | ICD-10-CM | POA: Diagnosis not present

## 2017-05-09 DIAGNOSIS — R51 Headache: Secondary | ICD-10-CM | POA: Insufficient documentation

## 2017-05-09 NOTE — Progress Notes (Signed)
Subjective:    Patient ID: Lauren Chapman, female    DOB: October 19, 1970, 47 y.o.   MRN: [id number]  HPI  47 y/o female presents for follow up for migraines. She had weakness with Botox and she was started on another injectable.  Last clinic visit 02/08/17.  Since that time, pt had NCS/EMG and brain MRI, both reviewed and unremarkable.  She notes improvement with dysphagia, neck pain.  Insurance denied aimovig and she was started on Zofran and Tizanidine. She can only take it at times because she cares for her daughter. She is following up with Neurology.   Pain Inventory Average Pain 4 Pain Right Now 3 My pain is sharp, dull and stabbing  In the last 24 hours, has pain interfered with the following? General activity 3 Relation with others 1 Enjoyment of life 2 What TIME of day is your pain at its worst? evening Sleep (in general) Poor  Pain is worse with: bending and some activites Pain improves with: medication Relief from Meds: 5  Mobility walk without assistance Do you have any goals in this area?  no  Function Do you have any goals in this area?  no  Neuro/Psych No problems in this area anxiety suicidal thoughts  Prior Studies Any changes since last visit?  no  Physicians involved in your care Any changes since last visit?  no   Family History  Problem Relation Age of Onset  . Depression Mother   . Alcohol abuse Father   . Crohn's disease Sister   . Stroke Brother 71  . Migraines Daughter   . GER disease Daughter   . Migraines Daughter   . GER disease Daughter    Social History   Socioeconomic History  . Marital status: Divorced    Spouse name: n/a  . Number of children: 2  . Years of education: 70  . Highest education level: Not on file  Occupational History  . Occupation: Disabled    Comment: Formerly a Pharmacist, hospital.  Social Needs  . Financial resource strain: Not on file  . Food insecurity:    Worry: Not on file    Inability: Not on file  .  Transportation needs:    Medical: Not on file    Non-medical: Not on file  Tobacco Use  . Smoking status: Current Every Day Smoker    Packs/day: 0.25    Years: 33.00    Pack years: 8.25    Types: Cigarettes    Start date: 10/10/1981  . Smokeless tobacco: Never Used  Substance and Sexual Activity  . Alcohol use: Yes    Alcohol/week: 0.0 oz    Comment: occasionally  . Drug use: No  . Sexual activity: Not Currently    Partners: Male    Birth control/protection: None  Lifestyle  . Physical activity:    Days per week: Not on file    Minutes per session: Not on file  . Stress: Not on file  Relationships  . Social connections:    Talks on phone: Not on file    Gets together: Not on file    Attends religious service: Not on file    Active member of club or organization: Not on file    Attends meetings of clubs or organizations: Not on file    Relationship status: Not on file  Other Topics Concern  . Not on file  Social History Narrative   Lives at home with her daughter.   Right-handed.   2-4  cups caffeine daily.   Past Surgical History:  Procedure Laterality Date  . CARPAL TUNNEL RELEASE    . CHOLECYSTECTOMY    . GANGLION CYST EXCISION     RIGHT wrist  . KNEE ARTHROSCOPY WITH ANTERIOR CRUCIATE LIGAMENT (ACL) REPAIR Right 10/2015   Past Medical History:  Diagnosis Date  . Anxiety   . Bipolar affective psychosis ([place])   . Depression   . GERD (gastroesophageal reflux disease)   . Migraine    BP 108/71 (BP Location: Right Arm, Patient Position: Sitting, Cuff Size: Normal)   Pulse 64   Resp 14   Ht 5\' 3"  (1.6 m)   Wt 146 lb (66.2 kg)   SpO2 96%   BMI 25.86 kg/m   Opioid Risk Score:   Fall Risk Score:  `1  Depression screen PHQ 2/9  Depression screen [facility] 2/9 04/11/2017 01/19/2017 08/17/2015 04/29/2015 04/06/2015 03/22/2015 02/04/2015  Decreased Interest 0 0 0 2 0 0 0  Down, Depressed, Hopeless 3 0 0 3 0 0 0  PHQ - 2 Score 3 0 0 5 0 0 0  Altered sleeping 1 - - 1 - -  -  Tired, decreased energy 2 - - 1 - - -  Change in appetite 3 - - 0 - - -  Feeling bad or failure about yourself  1 - - 3 - - -  Trouble concentrating 3 - - 2 - - -  Moving slowly or fidgety/restless 2 - - 0 - - -  Suicidal thoughts 1 - - 2 - - -  PHQ-9 Score 16 - - 14 - - -  Difficult doing work/chores Somewhat difficult - - Somewhat difficult - - -     Review of Systems  Constitutional: Negative.   HENT: Negative.   Eyes: Positive for photophobia and visual disturbance.  Respiratory: Negative.   Cardiovascular: Negative.   Gastrointestinal: Negative.   Endocrine: Negative.   Musculoskeletal: Negative.   Skin: Negative.   Allergic/Immunologic: Negative.   Neurological: Positive for headaches.  Hematological: Negative.   Psychiatric/Behavioral: Negative.        Depression Anxiety  All other systems reviewed and are negative.     Objective:   Physical Exam HENT: Normocephalic, Atraumatic Eyes: EOMI, Conj WNL Cardio: RRR. No JVD. Pulm: B/l clear to auscultation. Effort normal Abd: Non-distended, BS+ MSK: Gait WNL.  No edema. Neuro: B/l ptosis, improving  Mild nystagmus on left gaze, improving Skin: Warm and Dry. No rashes.    Assessment & Plan:  47 y/o female presents for follow up for migraines.  1. Chronic migraines Since the age of 42 1st injection on 02/04/15, last injection 10/17. She had increase in her left temporalis stie on 06/2015. Patient with dysphagia, increasing ptosis, and neck pain after last injection.  Improvement since then.    Do not recommend repeat Botox   MRI/NCS/EMG reviewed, unremarkable  Cont meds per Neurology

## 2017-05-15 ENCOUNTER — Other Ambulatory Visit: Payer: Self-pay

## 2017-05-15 ENCOUNTER — Other Ambulatory Visit: Payer: Self-pay | Admitting: Family Medicine

## 2017-05-15 ENCOUNTER — Encounter: Payer: Self-pay | Admitting: Family Medicine

## 2017-05-15 ENCOUNTER — Ambulatory Visit (INDEPENDENT_AMBULATORY_CARE_PROVIDER_SITE_OTHER): Payer: Medicare Other | Admitting: Family Medicine

## 2017-05-15 VITALS — BP 110/84 | HR 100 | Temp 99.3°F | Ht 64.0 in | Wt 139.6 lb

## 2017-05-15 DIAGNOSIS — R634 Abnormal weight loss: Secondary | ICD-10-CM | POA: Diagnosis not present

## 2017-05-15 DIAGNOSIS — N950 Postmenopausal bleeding: Secondary | ICD-10-CM

## 2017-05-15 DIAGNOSIS — Z124 Encounter for screening for malignant neoplasm of cervix: Secondary | ICD-10-CM

## 2017-05-15 LAB — POCT URINE PREGNANCY: Preg Test, Ur: NEGATIVE

## 2017-05-15 MED ORDER — NORETHINDRONE ACETATE 5 MG PO TABS: 10.0000 mg | ORAL_TABLET | Freq: Every day | ORAL | 0 refills | Status: DC

## 2017-05-15 NOTE — Progress Notes (Signed)
4/23/20194:34 PM  Lauren Chapman September 17, 1970, 47 y.o. female [id number]  Chief Complaint  Patient presents with  . Menstrual Problem    Has not had a period in 3 yrs, yesterday unexpected cycle came very heavy    HPI:   Patient is a 47 y.o. female with past medical history significant for menopause 3 years ago who presents today for heavy vaginal bleeding with clots that started yesterday. Has been using her daughter postpartum hospital pads, as she had nothing available at home, changing pad every 3 hours. Feels lightheaded if she stands too quickly. Denies palpitations, CP or SOB. Has some mild bilateral pelvic cramping.   Has lost about 10 lbs in past month, thinks it might be related to worsening depression, meds recently changed to sertraline She denies any nipple discharge, headaches, vision changes or nausea She denies any changes to hair/skin/nails, constipation or diarrhea. She does endorse always being cold   Last pap with neg HPV 09/2011 Reports colpo in her 7s Not sexually active    Depression screen [facility] 2/9 05/15/2017 04/11/2017 01/19/2017  Decreased Interest (No Data) 0 0  Down, Depressed, Hopeless - 3 0  PHQ - 2 Score - 3 0  Altered sleeping - 1 -  Tired, decreased energy - 2 -  Change in appetite - 3 -  Feeling bad or failure about yourself  - 1 -  Trouble concentrating - 3 -  Moving slowly or fidgety/restless - 2 -  Suicidal thoughts - 1 -  PHQ-9 Score - 16 -  Difficult doing work/chores - Somewhat difficult -    Allergies  Allergen Reactions  . Codeine Anaphylaxis    Anything with codeine    Prior to Admission medications   Medication Sig Start Date End Date Taking? Authorizing Provider  diazepam (VALIUM) 2 MG tablet Take 2 mg by mouth at bedtime.   Yes [provider]  esomeprazole (NEXIUM) 20 MG capsule Take 20 mg by mouth daily at 12 noon.   Yes [provider]  gabapentin (NEURONTIN) 400 MG capsule Take 800 mg by mouth 3  (three) times daily.  01/13/15  Yes [provider]  lamoTRIgine (LAMICTAL) 150 MG tablet Take 150 mg by mouth daily.   Yes [provider]  OLANZapine (ZYPREXA) 10 MG tablet Take 10 mg by mouth at bedtime.   Yes [provider]  ondansetron (ZOFRAN ODT) 4 MG disintegrating tablet Take 1 tablet (4 mg total) by mouth every 8 (eight) hours as needed. 10/02/16  Yes Marcial Pacas, MD  rizatriptan (MAXALT-MLT) 10 MG disintegrating tablet Take 1 tablet (10 mg total) by mouth as needed. May repeat in 2 hours if needed 10/02/16  Yes Marcial Pacas, MD  sertraline (ZOLOFT) 100 MG tablet Take 150 mg by mouth daily.   Yes [provider]  tiZANidine (ZANAFLEX) 4 MG tablet Take 1 tablet (4 mg total) by mouth every 6 (six) hours as needed for muscle spasms. 10/02/16  Yes Marcial Pacas, MD  Vilazodone HCl (VIIBRYD) 20 MG TABS Take 20 mg by mouth daily.   Yes [provider]    Past Medical History:  Diagnosis Date  . Anxiety   . Bipolar affective psychosis ([place])   . Depression   . GERD (gastroesophageal reflux disease)   . Migraine     Past Surgical History:  Procedure Laterality Date  . CARPAL TUNNEL RELEASE    . CHOLECYSTECTOMY    . GANGLION CYST EXCISION     RIGHT wrist  .  KNEE ARTHROSCOPY WITH ANTERIOR CRUCIATE LIGAMENT (ACL) REPAIR Right 10/2015    Social History   Tobacco Use  . Smoking status: Current Every Day Smoker    Packs/day: 0.25    Years: 33.00    Pack years: 8.25    Types: Cigarettes    Start date: 10/10/1981  . Smokeless tobacco: Never Used  Substance Use Topics  . Alcohol use: Yes    Alcohol/week: 0.0 oz    Comment: occasionally    Family History  Problem Relation Age of Onset  . Depression Mother   . Alcohol abuse Father   . Crohn's disease Sister   . Stroke Brother 8  . Migraines Daughter   . GER disease Daughter   . Migraines Daughter   . GER disease Daughter     Review of Systems  Constitutional: Positive for chills,  malaise/fatigue and weight loss. Negative for diaphoresis and fever.  Eyes: Negative for blurred vision and double vision.  Respiratory: Negative for cough and shortness of breath.   Cardiovascular: Negative for chest pain, palpitations and leg swelling.  Gastrointestinal: Negative for abdominal pain, constipation, diarrhea, nausea and vomiting.  Genitourinary: Negative for dysuria and hematuria.       Neg breast lumps or nipple discharge Neg vaginal discharge,  Neurological: Positive for dizziness. Negative for headaches.  Psychiatric/Behavioral: Positive for depression.    OBJECTIVE:  Blood pressure 110/84, pulse 100, temperature 99.3 F (37.4 C), temperature source Oral, height 5\' 4"  (1.626 m), weight 139 lb 9.6 oz (63.3 kg), SpO2 97 %.  Wt Readings from Last 3 Encounters:  05/15/17 139 lb 9.6 oz (63.3 kg)  05/09/17 146 lb (66.2 kg)  04/11/17 154 lb 3.2 oz (69.9 kg)    Physical Exam  Constitutional: She is oriented to person, place, and time. She appears well-developed and well-nourished.  HENT:  Head: Normocephalic and atraumatic.  Mouth/Throat: Oropharynx is clear and moist. No oropharyngeal exudate.  Eyes: Pupils are equal, round, and reactive to light. EOM are normal. No scleral icterus.  Neck: Neck supple. No thyromegaly present.  Cardiovascular: Normal rate, regular rhythm and normal heart sounds. Exam reveals no gallop and no friction rub.  No murmur heard. Pulmonary/Chest: Effort normal and breath sounds normal. She has no wheezes. She has no rales.  Abdominal: Soft. Bowel sounds are normal. She exhibits no distension and no mass. There is no tenderness.  Genitourinary: Vagina normal. There is no rash or lesion on the right labia. There is no rash or lesion on the left labia. Uterus is deviated. Uterus is not enlarged, not fixed and not tender. Cervix exhibits discharge (blood). Cervix exhibits no motion tenderness and no friability. Right adnexum displays no mass and no  tenderness. Left adnexum displays no mass and no tenderness.    Musculoskeletal: She exhibits no edema.  Neurological: She is alert and oriented to person, place, and time.  Skin: Skin is warm and dry. Capillary refill takes 2 to 3 seconds.  Psychiatric: Her affect is blunt.  Nursing note and vitals reviewed.     Results for orders placed or performed in visit on 05/15/17 (from the past 24 hour(s))  POCT urine pregnancy     Status: Normal   Collection Time: 05/15/17  4:55 PM  Result Value Ref Range   Preg Test, Ur Negative Negative      ASSESSMENT and PLAN  1. Postmenopausal bleeding Etiology not revealed by exam. Checking labs per routine and ordering pelvic US. Starting oral progesterone to help stop bleeding  given patient mildly symptomatic.  - POCT urine pregnancy - TSH - Pap IG and HPV (high risk) DNA detection - US PELVIC COMPLETE WITH TRANSVAGINAL; Future - CBC with Differential - Ambulatory referral to Gynecology -norethindrone 10mg  daily x 7 days, new meds r/se/b reviewed.  RTC precautions reviewed.   2. Cervical cancer screening - Pap IG and HPV (high risk) DNA detection  3. Loss of weight - TSH  Return in about 1 week (around 05/22/2017).    Rutherford Guys, MD [facility] [place], [place] [address] Ph.  [phone] Fax [phone]

## 2017-05-15 NOTE — Patient Instructions (Signed)
     IF you received an x-ray today, you will receive an invoice from [facility]. Please contact [facility] at [phone] with questions or concerns regarding your invoice.   IF you received labwork today, you will receive an invoice from [facility]. Please contact [facility] at [phone] with questions or concerns regarding your invoice.   Our billing staff will not be able to assist you with questions regarding bills from these companies.  You will be contacted with the lab results as soon as they are available. The fastest way to get your results is to activate your My Chart account. Instructions are located on the last page of this paperwork. If you have not heard from us regarding the results in 2 weeks, please contact this office.     

## 2017-05-16 LAB — CBC WITH DIFFERENTIAL/PLATELET
Basophils Absolute: 0 10*3/uL (ref 0.0–0.2)
Basos: 0 %
EOS (ABSOLUTE): 0.2 10*3/uL (ref 0.0–0.4)
Eos: 2 %
Hematocrit: 45.8 % (ref 34.0–46.6)
Hemoglobin: 16.2 g/dL — ABNORMAL HIGH (ref 11.1–15.9)
Immature Grans (Abs): 0 10*3/uL (ref 0.0–0.1)
Immature Granulocytes: 0 %
Lymphocytes Absolute: 3.2 10*3/uL — ABNORMAL HIGH (ref 0.7–3.1)
Lymphs: 28 %
MCH: 31.6 pg (ref 26.6–33.0)
MCHC: 35.4 g/dL (ref 31.5–35.7)
MCV: 89 fL (ref 79–97)
Monocytes Absolute: 0.6 10*3/uL (ref 0.1–0.9)
Monocytes: 6 %
Neutrophils Absolute: 7.2 10*3/uL — ABNORMAL HIGH (ref 1.4–7.0)
Neutrophils: 64 %
Platelets: 258 10*3/uL (ref 150–379)
RBC: 5.13 x10E6/uL (ref 3.77–5.28)
RDW: 13.7 % (ref 12.3–15.4)
WBC: 11.3 10*3/uL — ABNORMAL HIGH (ref 3.4–10.8)

## 2017-05-16 LAB — TSH: TSH: 0.97 u[IU]/mL (ref 0.450–4.500)

## 2017-05-17 LAB — PAP IG AND HPV HIGH-RISK
HPV, high-risk: NEGATIVE
PAP Smear Comment: 0

## 2017-05-18 ENCOUNTER — Telehealth: Payer: Self-pay | Admitting: *Deleted

## 2017-05-18 NOTE — Telephone Encounter (Signed)
Pt advised of lab results. Pt states she has an appointment on 05/22/17.

## 2017-05-22 ENCOUNTER — Ambulatory Visit (INDEPENDENT_AMBULATORY_CARE_PROVIDER_SITE_OTHER): Payer: Medicare Other | Admitting: Family Medicine

## 2017-05-22 ENCOUNTER — Encounter: Payer: Self-pay | Admitting: Family Medicine

## 2017-05-22 ENCOUNTER — Other Ambulatory Visit: Payer: Self-pay

## 2017-05-22 VITALS — BP 96/60 | HR 70 | Temp 99.1°F | Ht 64.0 in | Wt 142.4 lb

## 2017-05-22 DIAGNOSIS — N950 Postmenopausal bleeding: Secondary | ICD-10-CM

## 2017-05-22 NOTE — Patient Instructions (Signed)
     IF you received an x-ray today, you will receive an invoice from [facility]. Please contact [facility] at [phone] with questions or concerns regarding your invoice.   IF you received labwork today, you will receive an invoice from [facility]. Please contact [facility] at [phone] with questions or concerns regarding your invoice.   Our billing staff will not be able to assist you with questions regarding bills from these companies.  You will be contacted with the lab results as soon as they are available. The fastest way to get your results is to activate your My Chart account. Instructions are located on the last page of this paperwork. If you have not heard from us regarding the results in 2 weeks, please contact this office.     

## 2017-05-22 NOTE — Progress Notes (Signed)
4/30/201910:46 AM  [name] September 10, 1970, 46 y.o. female [id number]  Chief Complaint  Patient presents with  . Follow-up    Postmenopausal bleeding, says bleeding has slowed down a great deal. Feeling much better    HPI:   Patient is a 47 y.o. female with past medical history significant for Postmenopausal bleeding who presents today for followup  Bleeding significantly reduced Takes last progesterone dose tonight Has appt with gyn on may 17th, patient aware Has not heard for pelvic US appt yet Here to discuss labs  - no anemia, normal TSH, pap neg SIL with + endocervical cells present  Fall Risk  05/22/2017 05/15/2017 05/09/2017 04/11/2017 02/08/2017  Falls in the past year? No No No No No  Comment - - - - -  Number falls in past yr: - - - - -  Injury with Fall? - - - - -  Comment - - - - -  Risk for fall due to : - - - - -  Risk for fall due to: Comment - - - - -     Depression screen [facility] 2/9 05/22/2017 05/15/2017 04/11/2017  Decreased Interest 0 (No Data) 0  Down, Depressed, Hopeless 0 - 3  PHQ - 2 Score 0 - 3  Altered sleeping - - 1  Tired, decreased energy - - 2  Change in appetite - - 3  Feeling bad or failure about yourself  - - 1  Trouble concentrating - - 3  Moving slowly or fidgety/restless - - 2  Suicidal thoughts - - 1  PHQ-9 Score - - 16  Difficult doing work/chores - - Somewhat difficult    Allergies  Allergen Reactions  . Codeine Anaphylaxis    Anything with codeine    Prior to Admission medications   Medication Sig Start Date End Date Taking? Authorizing Provider  diazepam (VALIUM) 2 MG tablet Take 2 mg by mouth at bedtime.   Yes [provider]  esomeprazole (NEXIUM) 20 MG capsule Take 20 mg by mouth daily at 12 noon.   Yes [provider]  gabapentin (NEURONTIN) 400 MG capsule Take 800 mg by mouth 3 (three) times daily.  01/13/15  Yes [provider]  lamoTRIgine (LAMICTAL) 150 MG tablet Take 150 mg by mouth daily.    Yes [provider]  norethindrone (AYGESTIN) 5 MG tablet Take 2 tablets (10 mg total) by mouth daily. 05/15/17  Yes Rutherford Guys, MD  OLANZapine (ZYPREXA) 10 MG tablet Take 10 mg by mouth at bedtime.   Yes [provider]  ondansetron (ZOFRAN ODT) 4 MG disintegrating tablet Take 1 tablet (4 mg total) by mouth every 8 (eight) hours as needed. 10/02/16  Yes Marcial Pacas, MD  rizatriptan (MAXALT-MLT) 10 MG disintegrating tablet Take 1 tablet (10 mg total) by mouth as needed. May repeat in 2 hours if needed 10/02/16  Yes Marcial Pacas, MD  sertraline (ZOLOFT) 100 MG tablet Take 150 mg by mouth daily.   Yes [provider]  tiZANidine (ZANAFLEX) 4 MG tablet Take 1 tablet (4 mg total) by mouth every 6 (six) hours as needed for muscle spasms. 10/02/16  Yes Marcial Pacas, MD  Vilazodone HCl (VIIBRYD) 20 MG TABS Take 20 mg by mouth daily.   Yes [provider]    Past Medical History:  Diagnosis Date  . Anxiety   . Bipolar affective psychosis ([place])   . Depression   . GERD (gastroesophageal reflux disease)   . Migraine  Past Surgical History:  Procedure Laterality Date  . CARPAL TUNNEL RELEASE    . CHOLECYSTECTOMY    . GANGLION CYST EXCISION     RIGHT wrist  . KNEE ARTHROSCOPY WITH ANTERIOR CRUCIATE LIGAMENT (ACL) REPAIR Right 10/2015    Social History   Tobacco Use  . Smoking status: Current Every Day Smoker    Packs/day: 0.25    Years: 33.00    Pack years: 8.25    Types: Cigarettes    Start date: 10/10/1981  . Smokeless tobacco: Never Used  Substance Use Topics  . Alcohol use: Yes    Alcohol/week: 0.0 oz    Comment: occasionally    Family History  Problem Relation Age of Onset  . Depression Mother   . Alcohol abuse Father   . Crohn's disease Sister   . Stroke Brother 32  . Migraines Daughter   . GER disease Daughter   . Migraines Daughter   . GER disease Daughter     ROS Per hpi Denies any dizziness, SOB, chest pain, fatigue,  palpitations  OBJECTIVE:  Blood pressure 96/60, pulse 70, temperature 99.1 F (37.3 C), temperature source Oral, height 5\' 4"  (1.626 m), weight 142 lb 6.4 oz (64.6 kg), SpO2 98 %.  Physical Exam  Constitutional: She is oriented to person, place, and time. She appears well-developed and well-nourished.  HENT:  Head: Normocephalic and atraumatic.  Mouth/Throat: Mucous membranes are normal.  Eyes: Pupils are equal, round, and reactive to light. EOM are normal. No scleral icterus.  Neck: Neck supple.  Pulmonary/Chest: Effort normal.  Neurological: She is alert and oriented to person, place, and time.  Skin: Skin is warm and dry. No pallor.  Nursing note and vitals reviewed.   ASSESSMENT and PLAN  1. Postmenopausal bleeding Will complete progesterone tonight, will see if bleeding resumes. [place] and Gyn appt pending. Briefly discussed possible upcoming next steps.  Return if symptoms worsen or fail to improve.    Rutherford Guys, MD Primary Care at [place], [place] [address] Ph.  [phone] Fax [phone]

## 2017-05-29 DIAGNOSIS — F250 Schizoaffective disorder, bipolar type: Secondary | ICD-10-CM | POA: Diagnosis not present

## 2017-05-30 ENCOUNTER — Ambulatory Visit
Admission: RE | Admit: 2017-05-30 | Discharge: 2017-05-30 | Disposition: A | Discharge status: Home / Self Care | Payer: Medicare Other | Source: Ambulatory Visit | Attending: Family Medicine | Admitting: Family Medicine

## 2017-05-30 DIAGNOSIS — D259 Leiomyoma of uterus, unspecified: Secondary | ICD-10-CM | POA: Diagnosis not present

## 2017-05-30 DIAGNOSIS — N950 Postmenopausal bleeding: Secondary | ICD-10-CM

## 2017-05-31 ENCOUNTER — Other Ambulatory Visit: Payer: Self-pay | Admitting: *Deleted

## 2017-05-31 ENCOUNTER — Other Ambulatory Visit: Payer: Self-pay | Admitting: Gynecology

## 2017-05-31 DIAGNOSIS — N950 Postmenopausal bleeding: Secondary | ICD-10-CM

## 2017-06-05 DIAGNOSIS — F250 Schizoaffective disorder, bipolar type: Secondary | ICD-10-CM | POA: Diagnosis not present

## 2017-06-07 ENCOUNTER — Ambulatory Visit (INDEPENDENT_AMBULATORY_CARE_PROVIDER_SITE_OTHER): Payer: Medicare Other | Admitting: Gynecology

## 2017-06-07 ENCOUNTER — Ambulatory Visit (INDEPENDENT_AMBULATORY_CARE_PROVIDER_SITE_OTHER): Payer: Medicare Other

## 2017-06-07 ENCOUNTER — Encounter: Payer: Self-pay | Admitting: Gynecology

## 2017-06-07 VITALS — BP 118/76 | Ht 64.0 in | Wt 140.0 lb

## 2017-06-07 DIAGNOSIS — N950 Postmenopausal bleeding: Secondary | ICD-10-CM

## 2017-06-07 DIAGNOSIS — N83202 Unspecified ovarian cyst, left side: Secondary | ICD-10-CM

## 2017-06-07 NOTE — Patient Instructions (Signed)
Call if any further bleeding. Follow-up in 3 months for recheck of left ovarian cyst with ultrasound.

## 2017-06-07 NOTE — Progress Notes (Addendum)
    Lauren Chapman 12/23/1970 [id number]        47 y.o.  [id number] new patient presents in consultation from Dr. Pamella Pert at [facility] due to postmenopausal bleeding.  The patient's last menstrual period was approximately 3 years ago and she was doing well until recently when she had an episode of heavy bleeding.  Was treated with norethindrone and has subsequently had a recurrent bout of bleeding and presents now for evaluation.  She has had no history of significant gynecologic pathology in the past, never being told she had leiomyoma or other issues.  No history of abnormal Pap smears.  Is up-to-date with her mammograms.  Most recent Pap smear 04/2017 was normal other than showing endometrial cells.  Presents today for examination, sonohysterogram and management.  Past medical history,surgical history, problem list, medications, allergies, family history and social history were all reviewed and documented in the EPIC chart.  Directed ROS with pertinent positives and negatives documented in the history of present illness/assessment and plan.  Exam: [place] assistant Vitals:   06/07/17 0932  BP: 118/76  Weight: 140 lb (63.5 kg)  Height: 5\' 4"  (1.626 m)   General appearance:  Normal Abdomen soft nontender without masses guarding rebound Pelvic external BUS vagina with mild atrophic changes.  Cervix grossly normal.  Uterus normal size midline mobile nontender.  Adnexa without masses or tenderness.  Ultrasound transvaginal and transabdominal shows uterus normal size.  Small intramural myoma 24 x 21 x 23 mm.  Endometrial echo try layered measuring 10.2 mm.  Right ovary normal.  Left ovary with 2 cysts: #1 echo-free 25 x 26 mm #2 19 x 25 mm echo-free with thin septation.  Color flow Doppler negative.  Cul-de-sac negative.  Sonohysterogram performed, sterile technique, easy catheter introduction, good distention with no abnormality seen.  Endometrial biopsy taken.  Patient tolerated  well.  Assessment/Plan:  47 y.o. [id number] with postmenopausal bleeding.  Sonohysterogram shows no intracavitary defects.  Endometrium appears tri layered suggesting ovulatory function.  Suspect she may have had an escape ovulation with the bleeding.  Benign-appearing left ovarian cysts.  Recommend observation for now.  Patient to follow-up on her endometrial biopsy and she will call if she does any further bleeding.  Will check baseline FSH.  I did recommend a follow-up ultrasound in 3 months to relook at the left ovarian cyst.  We discussed various possibilities with the cyst to include functional, benign and malignant.  Most likely this represents a functional change and will resolve by our follow-up ultrasound.  Follow-up has been provided to Dr. Etter Sjogren MD, 9:53 AM 06/07/2017

## 2017-06-08 ENCOUNTER — Ambulatory Visit: Payer: Medicare Other | Admitting: Gynecology

## 2017-06-08 LAB — FOLLICLE STIMULATING HORMONE: FSH: 32.2 m[IU]/mL

## 2017-06-19 DIAGNOSIS — F250 Schizoaffective disorder, bipolar type: Secondary | ICD-10-CM | POA: Diagnosis not present

## 2017-07-10 DIAGNOSIS — F250 Schizoaffective disorder, bipolar type: Secondary | ICD-10-CM | POA: Diagnosis not present

## 2017-07-17 DIAGNOSIS — F250 Schizoaffective disorder, bipolar type: Secondary | ICD-10-CM | POA: Diagnosis not present

## 2017-07-25 DIAGNOSIS — H43811 Vitreous degeneration, right eye: Secondary | ICD-10-CM | POA: Diagnosis not present

## 2017-07-25 DIAGNOSIS — H04123 Dry eye syndrome of bilateral lacrimal glands: Secondary | ICD-10-CM | POA: Diagnosis not present

## 2017-07-25 DIAGNOSIS — H43392 Other vitreous opacities, left eye: Secondary | ICD-10-CM | POA: Diagnosis not present

## 2017-07-31 DIAGNOSIS — F250 Schizoaffective disorder, bipolar type: Secondary | ICD-10-CM | POA: Diagnosis not present

## 2017-08-08 ENCOUNTER — Telehealth: Payer: Self-pay | Admitting: *Deleted

## 2017-08-08 MED ORDER — MEGESTROL ACETATE 20 MG PO TABS: 20.0000 mg | ORAL_TABLET | Freq: Every day | ORAL | 0 refills | Status: DC

## 2017-08-08 NOTE — Telephone Encounter (Signed)
Recommend Megace 20 mg daily through the scheduled ultrasound.  We can discuss at the ultrasound but hopefully the Megace medication will stop the bleeding.  If pregnancy is possibility then recommend checking UPT before medication.

## 2017-08-08 NOTE — Telephone Encounter (Signed)
Patient informed with all the below, Rx sent.

## 2017-08-08 NOTE — Telephone Encounter (Signed)
Patient called to follow up from [place] 06/07/17 states since OV she has been having a cycle every other week,not heavy normal period flow. She is scheduled for repeat ultrasound on 08/29/17. She wanted to know if ultrasound should be moved up if possible? She didn't know what else should be done since normal SHGM at 06/07/17. Please advise

## 2017-08-20 ENCOUNTER — Ambulatory Visit: Payer: Self-pay | Admitting: Family Medicine

## 2017-08-20 NOTE — Telephone Encounter (Signed)
See triage notes.   Pt already has an appt tomorrow for this issue.    Reason for Disposition . [1] Small swelling or lump AND [2] unexplained AND [3] present > 1 week  Answer Assessment - Initial Assessment Questions 1. APPEARANCE of SWELLING: "What does it look like?" (e.g., lymph node, insect bite, mole)     Looks like a mole.  There's a part on end that is discolored and raised.   Has pieces coming off of it.   Like threads.  2. SIZE: "How large is the swelling?" (inches, cm or compare to coins)     It's on the right calf on the back     Size of a dime 3. LOCATION: "Where is the swelling located?"     See above 4. ONSET: "When did the swelling start?"     Been there a while but it's getting bigger. 5. PAIN: "Is it painful?" If so, ask: "How much?"     Itches.  It hurts when I touch it.   No bleeding. 6. ITCH: "Does it itch?" If so, ask: "How much?"     It itches.   I scratching the back of my leg and felt it. 7. CAUSE: "What do you think caused the swelling?"     Maybe cancer? 8. OTHER SYMPTOMS: "Do you have any other symptoms?" (e.g., fever)     No.  Protocols used: SKIN LUMP OR LOCALIZED SWELLING-A-AH

## 2017-08-21 ENCOUNTER — Encounter: Payer: Self-pay | Admitting: Family Medicine

## 2017-08-21 ENCOUNTER — Ambulatory Visit (INDEPENDENT_AMBULATORY_CARE_PROVIDER_SITE_OTHER): Payer: Medicare Other | Admitting: Family Medicine

## 2017-08-21 ENCOUNTER — Other Ambulatory Visit: Payer: Self-pay

## 2017-08-21 VITALS — BP 120/84 | HR 76 | Temp 99.2°F | Ht 64.0 in | Wt 138.4 lb

## 2017-08-21 DIAGNOSIS — L821 Other seborrheic keratosis: Secondary | ICD-10-CM

## 2017-08-21 DIAGNOSIS — N83202 Unspecified ovarian cyst, left side: Secondary | ICD-10-CM | POA: Diagnosis not present

## 2017-08-21 DIAGNOSIS — N950 Postmenopausal bleeding: Secondary | ICD-10-CM | POA: Diagnosis not present

## 2017-08-21 DIAGNOSIS — F250 Schizoaffective disorder, bipolar type: Secondary | ICD-10-CM | POA: Diagnosis not present

## 2017-08-21 NOTE — Patient Instructions (Signed)
     IF you received an x-ray today, you will receive an invoice from [facility]. Please contact [facility] at [phone] with questions or concerns regarding your invoice.   IF you received labwork today, you will receive an invoice from [facility]. Please contact [facility] at [phone] with questions or concerns regarding your invoice.   Our billing staff will not be able to assist you with questions regarding bills from these companies.  You will be contacted with the lab results as soon as they are available. The fastest way to get your results is to activate your My Chart account. Instructions are located on the last page of this paperwork. If you have not heard from us regarding the results in 2 weeks, please contact this office.     

## 2017-08-21 NOTE — Progress Notes (Signed)
7/30/201912:26 PM  Lauren Chapman October 03, 1970, 47 y.o. female [id number]  Chief Complaint  Patient presents with  . Leg Problem    Has spot on the back of the right leg, painful to touch. Noticed 1 month ago    HPI:   Patient is a 47 y.o. female with past medical history significant for postmenopausal bleeding, anxiety, bipolar do, GERD and migraines who presents today for a spot she has noticed in the back of her right leg. Sometimes it gets very irritated and can be painful. Has not tried anything for this.  Otherwise saw Dr Denman George - Gyn for postmenopausal bleeding, reassuring workup, escape ovulation. Incidental finding of ovarian cyst which will followed in 3 months  Fall Risk  08/21/2017 05/22/2017 05/15/2017 05/09/2017 04/11/2017  Falls in the past year? No No No No No  Comment - - - - -  Number falls in past yr: - - - - -  Injury with Fall? - - - - -  Comment - - - - -  Risk for fall due to : - - - - -  Risk for fall due to: Comment - - - - -     Depression screen [facility] 2/9 08/21/2017 05/22/2017 05/15/2017  Decreased Interest 0 0 (No Data)  Down, Depressed, Hopeless 0 0 -  PHQ - 2 Score 0 0 -  Altered sleeping - - -  Tired, decreased energy - - -  Change in appetite - - -  Feeling bad or failure about yourself  - - -  Trouble concentrating - - -  Moving slowly or fidgety/restless - - -  Suicidal thoughts - - -  PHQ-9 Score - - -  Difficult doing work/chores - - -    Allergies  Allergen Reactions  . Codeine Anaphylaxis    Anything with codeine    Prior to Admission medications   Medication Sig Start Date End Date Taking? Authorizing Provider  diazepam (VALIUM) 2 MG tablet Take 2 mg by mouth at bedtime.   Yes [provider]  esomeprazole (NEXIUM) 20 MG capsule Take 20 mg by mouth daily at 12 noon.   Yes [provider]  gabapentin (NEURONTIN) 400 MG capsule Take 800 mg by mouth 3 (three) times daily.  01/13/15  Yes [provider]    lamoTRIgine (LAMICTAL) 150 MG tablet Take 150 mg by mouth daily.   Yes [provider]  megestrol (MEGACE) 20 MG tablet Take 1 tablet (20 mg total) by mouth daily. 08/08/17  Yes Fontaine, Belinda Block, MD  norethindrone (AYGESTIN) 5 MG tablet Take 2 tablets (10 mg total) by mouth daily. 05/15/17  Yes Rutherford Guys, MD  rizatriptan (MAXALT-MLT) 10 MG disintegrating tablet Take 1 tablet (10 mg total) by mouth as needed. May repeat in 2 hours if needed 10/02/16  Yes Marcial Pacas, MD  sertraline (ZOLOFT) 100 MG tablet Take 150 mg by mouth daily.   Yes [provider]  tiZANidine (ZANAFLEX) 4 MG tablet Take 1 tablet (4 mg total) by mouth every 6 (six) hours as needed for muscle spasms. 10/02/16  Yes Marcial Pacas, MD  Vilazodone HCl (VIIBRYD) 20 MG TABS Take 20 mg by mouth daily.   Yes [provider]  VRAYLAR 4.5 MG CAPS  07/09/17  Yes [provider]  OLANZapine (ZYPREXA) 10 MG tablet Take 10 mg by mouth at bedtime.    [provider]    Past Medical History:  Diagnosis Date  . Anxiety   .  Bipolar affective psychosis ([place])   . Depression   . GERD (gastroesophageal reflux disease)   . Migraine     Past Surgical History:  Procedure Laterality Date  . CARPAL TUNNEL RELEASE    . CHOLECYSTECTOMY    . GANGLION CYST EXCISION     RIGHT wrist  . KNEE ARTHROSCOPY WITH ANTERIOR CRUCIATE LIGAMENT (ACL) REPAIR Right 10/2015    Social History   Tobacco Use  . Smoking status: Current Every Day Smoker    Packs/day: 0.25    Years: 33.00    Pack years: 8.25    Types: Cigarettes    Start date: 10/10/1981  . Smokeless tobacco: Never Used  Substance Use Topics  . Alcohol use: Yes    Alcohol/week: 0.0 oz    Comment: occasionally    Family History  Problem Relation Age of Onset  . Depression Mother   . Cancer Mother        Cervical  . Alcohol abuse Father   . Crohn's disease Sister   . Stroke Brother 92  . Migraines Daughter   . GER disease Daughter    . Depression Daughter   . Migraines Daughter   . GER disease Daughter     ROS Per hpi  OBJECTIVE:  Blood pressure 120/84, pulse 76, temperature 99.2 F (37.3 C), temperature source Oral, height 5\' 4"  (1.626 m), weight 138 lb 6.4 oz (62.8 kg), last menstrual period 06/07/2014, SpO2 97 %. Body mass index is 23.76 kg/m.   Physical Exam  Gen: AAOx3, NAD Skin: right calf with an about 54mm hyperpigmented papule with stuck on appearance. No erythema, TTP or drainage. Similar other lesions along mostly back.    ASSESSMENT and PLAN  1. Keratosis seborrheica Reassured patient.   2. Postmenopausal bleeding 3. Cyst of left ovary Per gyn  Other orders   Return if symptoms worsen or fail to improve.    Rutherford Guys, MD Primary Care at [place], [place] [address] Ph.  [phone] Fax [phone]

## 2017-08-29 ENCOUNTER — Encounter: Payer: Self-pay | Admitting: Gynecology

## 2017-08-29 ENCOUNTER — Ambulatory Visit (INDEPENDENT_AMBULATORY_CARE_PROVIDER_SITE_OTHER): Payer: Medicare Other

## 2017-08-29 ENCOUNTER — Ambulatory Visit (INDEPENDENT_AMBULATORY_CARE_PROVIDER_SITE_OTHER): Payer: Medicare Other | Admitting: Gynecology

## 2017-08-29 VITALS — BP 118/76

## 2017-08-29 DIAGNOSIS — N83202 Unspecified ovarian cyst, left side: Secondary | ICD-10-CM | POA: Diagnosis not present

## 2017-08-29 DIAGNOSIS — N950 Postmenopausal bleeding: Secondary | ICD-10-CM

## 2017-08-29 NOTE — Patient Instructions (Signed)
Follow up for ultrasound as scheduled 

## 2017-08-29 NOTE — Progress Notes (Signed)
    Lauren Chapman [birth year] [id number]        47 y.o.  [id number] presents for ultrasound.  Was amenorrheic for 3 years and then had some bleeding.  Sonohysterogram 05/2017 showed endometrial echo 10 mm tri-layered.  Several small cystic changes left ovary noted.  Endometrial biopsy returned proliferative endometrium.  FSH was 32.  She presents now for follow-up ultrasound to relook at the left ovary.  She had some bleeding afterwards and was placed on Megace 20 mg daily with no further bleeding.  Past medical history,surgical history, problem list, medications, allergies, family history and social history were all reviewed and documented in the EPIC chart.  Directed ROS with pertinent positives and negatives documented in the history of present illness/assessment and plan.  Exam: Vitals:   08/29/17 0932  BP: 118/76   General appearance:  Normal Ultrasound transvaginal shows uterus normal size and echotexture with several small myomas noted at 24 mm, 12 mm.  Endometrial echo 2.4 mm.  Right ovary with small microcalcification 4 mm.  Left ovary with 3 separate measured cysts.  #1 24 x 21 x 16 mm with low-level internal echoes.  #2 21 x 13 mm echo-free.  #3 14 x 16 mm with thin septation and low-level echoes.  All cysts without color flow Doppler.  Cul-de-sac negative.  Assessment/Plan:  47 y.o. [name] with history as above.  Endometrial echo 2.4 mm.  Currently on Megace with no bleeding.  Left ovary with several cystic changes all small, avascular and benign in appearance.  Reviewed this with the patient.  Disclaimer cannot rule out early tumor discussed.  At this point she is going to stop the Megace, monitor her symptoms and follow-up ultrasound in 3 to 6 months to relook at the left ovary.  We discussed that she probably is perimenopausal with the borderline elevated FSH and that the left ovary represents functional changes.  Various scenarios as far as if she continues to have irregular bleeding  possible progesterone only daily or Mirena IUD.  She does smoke and I think birth control pills are contraindicated.  Possible low-dose HRT also discussed although she at this point is not having significant hot flushes or sweats.  Patient will follow-up in 3 to 6 months for ultrasound and will go from there.  I spent a total of 10 face-to-face minutes with the patient, over 50% was spent counseling and coordination of care.     Anastasio Auerbach MD, 10:03 AM 08/29/2017

## 2017-08-30 ENCOUNTER — Other Ambulatory Visit: Payer: Medicare Other

## 2017-08-30 ENCOUNTER — Ambulatory Visit: Payer: Medicare Other | Admitting: Gynecology

## 2017-09-01 ENCOUNTER — Encounter: Payer: Self-pay | Admitting: Family Medicine

## 2017-09-03 ENCOUNTER — Telehealth: Payer: Self-pay | Admitting: *Deleted

## 2017-09-03 MED ORDER — MEGESTROL ACETATE 20 MG PO TABS: ORAL_TABLET | ORAL | 0 refills | Status: DC

## 2017-09-03 NOTE — Telephone Encounter (Signed)
Okay to restart on Megace 20 mg twice daily #30, have her call if continued bleeding or if bleeding persists after stopping.

## 2017-09-03 NOTE — Telephone Encounter (Signed)
Patient aware, Rx sent.   Dr. Phineas Real just wanted you be aware of the below.

## 2017-09-03 NOTE — Telephone Encounter (Signed)
Dr. Phineas Real patient) patient called had ultrasound on 08/29/17 and started back bleeding again yesterday wearing tampons changing every 2-3 hours. Dr.Fontaine prescribed megace 20 mg #30 in past, she asked if you would be willing to prescribed again? Please advise

## 2017-09-04 ENCOUNTER — Encounter: Payer: Self-pay | Admitting: Family Medicine

## 2017-09-04 ENCOUNTER — Other Ambulatory Visit: Payer: Self-pay

## 2017-09-04 ENCOUNTER — Ambulatory Visit (INDEPENDENT_AMBULATORY_CARE_PROVIDER_SITE_OTHER): Payer: Medicare Other | Admitting: Family Medicine

## 2017-09-04 VITALS — BP 100/62 | HR 87 | Temp 98.8°F | Ht 63.78 in | Wt 137.0 lb

## 2017-09-04 DIAGNOSIS — L820 Inflamed seborrheic keratosis: Secondary | ICD-10-CM | POA: Diagnosis not present

## 2017-09-04 NOTE — Patient Instructions (Signed)
  I will contact you with your lab results within the next 2 weeks.  If you have not heard from us then please contact us. The fastest way to get your results is to register for My Chart.   IF you received an x-ray today, you will receive an invoice from [facility]. Please contact [facility] at [phone] with questions or concerns regarding your invoice.   IF you received labwork today, you will receive an invoice from [facility]. Please contact [facility] at [phone] with questions or concerns regarding your invoice.   Our billing staff will not be able to assist you with questions regarding bills from these companies.  You will be contacted with the lab results as soon as they are available. The fastest way to get your results is to activate your My Chart account. Instructions are located on the last page of this paperwork. If you have not heard from us regarding the results in 2 weeks, please contact this office.     

## 2017-09-04 NOTE — Progress Notes (Signed)
8/13/20195:00 PM  Lauren Chapman 1970-07-11, 47 y.o. female [id number]  Chief Complaint  Patient presents with  . Follow-up    noticing changes in appearance of problem area. Requeesting referral for Dermatology    HPI:   Patient is a 47 y.o. female  who presents today for irritated SK on legs, she keeps irritating them when she shaves and from scratching as they are itchy. She would like them removed or referred to derm if needed  Fall Risk  09/04/2017 08/21/2017 05/22/2017 05/15/2017 05/09/2017  Falls in the past year? No No No No No  Comment - - - - -  Number falls in past yr: - - - - -  Injury with Fall? - - - - -  Comment - - - - -  Risk for fall due to : - - - - -  Risk for fall due to: Comment - - - - -     Depression screen [facility] 2/9 09/04/2017 08/21/2017 05/22/2017  Decreased Interest 0 0 0  Down, Depressed, Hopeless 0 0 0  PHQ - 2 Score 0 0 0  Altered sleeping - - -  Tired, decreased energy - - -  Change in appetite - - -  Feeling bad or failure about yourself  - - -  Trouble concentrating - - -  Moving slowly or fidgety/restless - - -  Suicidal thoughts - - -  PHQ-9 Score - - -  Difficult doing work/chores - - -    Allergies  Allergen Reactions  . Codeine Anaphylaxis    Anything with codeine    Prior to Admission medications   Medication Sig Start Date End Date Taking? Authorizing Provider  diazepam (VALIUM) 2 MG tablet Take 2 mg by mouth at bedtime.   Yes [provider]  esomeprazole (NEXIUM) 20 MG capsule Take 20 mg by mouth daily at 12 noon.   Yes [provider]  gabapentin (NEURONTIN) 400 MG capsule Take 800 mg by mouth 3 (three) times daily.  01/13/15  Yes [provider]  lamoTRIgine (LAMICTAL) 150 MG tablet Take 150 mg by mouth daily.   Yes [provider]  megestrol (MEGACE) 20 MG tablet Take one tablet by mouth twice daily for 3-5 days then daily 09/03/17  Yes Young, Candiss Norse, NP  norethindrone (AYGESTIN) 5 MG  tablet Take 2 tablets (10 mg total) by mouth daily. 05/15/17  Yes Rutherford Guys, MD  rizatriptan (MAXALT-MLT) 10 MG disintegrating tablet Take 1 tablet (10 mg total) by mouth as needed. May repeat in 2 hours if needed 10/02/16  Yes Marcial Pacas, MD  sertraline (ZOLOFT) 100 MG tablet Take 150 mg by mouth daily.   Yes [provider]  tiZANidine (ZANAFLEX) 4 MG tablet Take 1 tablet (4 mg total) by mouth every 6 (six) hours as needed for muscle spasms. 10/02/16  Yes Marcial Pacas, MD  VRAYLAR 4.5 MG CAPS  07/09/17  Yes [provider]    Past Medical History:  Diagnosis Date  . Anxiety   . Bipolar affective psychosis ([place])   . Depression   . GERD (gastroesophageal reflux disease)   . Migraine     Past Surgical History:  Procedure Laterality Date  . CARPAL TUNNEL RELEASE    . CHOLECYSTECTOMY    . GANGLION CYST EXCISION     RIGHT wrist  . KNEE ARTHROSCOPY WITH ANTERIOR CRUCIATE LIGAMENT (ACL) REPAIR Right 10/2015    Social History   Tobacco Use  . Smoking  status: Current Every Day Smoker    Packs/day: 0.25    Years: 33.00    Pack years: 8.25    Types: Cigarettes    Start date: 10/10/1981  . Smokeless tobacco: Never Used  Substance Use Topics  . Alcohol use: Yes    Alcohol/week: 0.0 standard drinks    Comment: occasionally    Family History  Problem Relation Age of Onset  . Depression Mother   . Cancer Mother        Cervical  . Alcohol abuse Father   . Crohn's disease Sister   . Stroke Brother 41  . Migraines Daughter   . GER disease Daughter   . Depression Daughter   . Migraines Daughter   . GER disease Daughter     ROS Per hpi  OBJECTIVE:  Blood pressure 100/62, pulse 87, temperature 98.8 F (37.1 C), temperature source Oral, height 5' 3.78" (1.62 m), weight 137 lb (62.1 kg), last menstrual period 06/07/2014, SpO2 97 %. Body mass index is 23.68 kg/m.   Physical Exam  Gen: AAOx3, NAD Skin: 2 hyperpigmented papules with stuck on appearance on  left leg, 1 inner thigh, 1 outer calf, both irriated   Procedure note After verbal consent with discussion of treatment options, risks, side effects and benefits, patient decided on cryotherapy. Each lesion treated with LN. Patient tolerated procedure well. Routine post care instructions and precautions given.  ASSESSMENT and PLAN   1. Keratosis, inflamed seborrheic Treated with cryotherapy. Tolerated well. Routine instructions and precautions given.  Return if symptoms worsen or fail to improve.    Rutherford Guys, MD Primary Care at [place], [place] [address] Ph.  [phone] Fax [phone]

## 2017-09-06 ENCOUNTER — Encounter: Payer: Self-pay | Admitting: Family Medicine

## 2017-09-11 DIAGNOSIS — F250 Schizoaffective disorder, bipolar type: Secondary | ICD-10-CM | POA: Diagnosis not present

## 2017-10-02 DIAGNOSIS — F250 Schizoaffective disorder, bipolar type: Secondary | ICD-10-CM | POA: Diagnosis not present

## 2017-10-13 ENCOUNTER — Other Ambulatory Visit: Payer: Self-pay | Admitting: Women's Health

## 2017-10-15 NOTE — Telephone Encounter (Signed)
Ultrasound scheduled on 01/01/18

## 2017-10-17 ENCOUNTER — Telehealth: Payer: Self-pay | Admitting: *Deleted

## 2017-10-17 MED ORDER — MEGESTROL ACETATE 20 MG PO TABS: 20.0000 mg | ORAL_TABLET | Freq: Every day | ORAL | 2 refills | Status: DC

## 2017-10-17 NOTE — Telephone Encounter (Signed)
Okay to refill x2 

## 2017-10-17 NOTE — Telephone Encounter (Signed)
Patient asked about refills?states takes daily, if she doesn't take megace bleeding starts back.

## 2017-10-17 NOTE — Telephone Encounter (Signed)
Okay for refill?  

## 2017-10-17 NOTE — Telephone Encounter (Signed)
Patient refill for megace was denied, called c/o bleeding that started yesterday, has ultrasound scheduled to recheck left ovary on 01/01/18. Asked if refill can be given? Please advise

## 2017-10-17 NOTE — Telephone Encounter (Signed)
Rx sent 

## 2017-10-18 DIAGNOSIS — F250 Schizoaffective disorder, bipolar type: Secondary | ICD-10-CM | POA: Diagnosis not present

## 2017-11-01 DIAGNOSIS — F250 Schizoaffective disorder, bipolar type: Secondary | ICD-10-CM | POA: Diagnosis not present

## 2017-11-07 ENCOUNTER — Ambulatory Visit: Payer: Self-pay | Admitting: Physical Medicine & Rehabilitation

## 2017-11-09 ENCOUNTER — Encounter: Payer: Medicare Other | Admitting: Physical Medicine & Rehabilitation

## 2017-11-13 ENCOUNTER — Ambulatory Visit (INDEPENDENT_AMBULATORY_CARE_PROVIDER_SITE_OTHER): Payer: Medicare Other | Admitting: Physician Assistant

## 2017-11-13 ENCOUNTER — Encounter: Payer: Self-pay | Admitting: Physician Assistant

## 2017-11-13 ENCOUNTER — Other Ambulatory Visit: Payer: Self-pay

## 2017-11-13 VITALS — BP 98/64 | HR 71 | Temp 98.0°F | Resp 16 | Ht 63.0 in | Wt 139.4 lb

## 2017-11-13 DIAGNOSIS — R059 Cough, unspecified: Secondary | ICD-10-CM

## 2017-11-13 DIAGNOSIS — R05 Cough: Secondary | ICD-10-CM

## 2017-11-13 DIAGNOSIS — J22 Unspecified acute lower respiratory infection: Secondary | ICD-10-CM | POA: Diagnosis not present

## 2017-11-13 MED ORDER — PROMETHAZINE-DM 6.25-15 MG/5ML PO SYRP: 5.0000 mL | ORAL_SOLUTION | Freq: Four times a day (QID) | ORAL | 0 refills | Status: DC | PRN

## 2017-11-13 MED ORDER — PREDNISONE 20 MG PO TABS: 40.0000 mg | ORAL_TABLET | Freq: Every day | ORAL | 0 refills | Status: AC

## 2017-11-13 MED ORDER — AZITHROMYCIN 250 MG PO TABS: ORAL_TABLET | ORAL | 0 refills | Status: DC

## 2017-11-13 NOTE — Progress Notes (Signed)
Lauren Chapman  MRN: [id number] DOB: 12-25-1970  PCP: Rutherford Guys, MD  Subjective:  Pt is a 47 year old female who presents to clinic for cough x 3 weeks.   Tylenol cold and Robitussin.  Tessalon doesn't help  Review of Systems  Constitutional: Negative for chills, diaphoresis, fatigue and fever.  HENT: Negative for congestion, postnasal drip, rhinorrhea, sinus pressure, sinus pain and sore throat.   Respiratory: Positive for cough. Negative for shortness of breath and wheezing.   Psychiatric/Behavioral: Negative for sleep disturbance.    Patient Active Problem List   Diagnosis Date Noted  . Weakness 01/25/2017  . Chronic migraine 12/22/2014  . Encounter for other general counseling or advice on contraception 09/02/2014  . Bipolar disorder ([place]) 10/12/2011  . Anxiety and depression 10/12/2011  . GERD (gastroesophageal reflux disease) 10/12/2011    Current Outpatient Medications on File Prior to Visit  Medication Sig Dispense Refill  . diazepam (VALIUM) 2 MG tablet Take 2 mg by mouth at bedtime.    Marland Kitchen esomeprazole (NEXIUM) 20 MG capsule Take 20 mg by mouth daily at 12 noon.    . gabapentin (NEURONTIN) 400 MG capsule Take 800 mg by mouth 3 (three) times daily.   2  . lamoTRIgine (LAMICTAL) 150 MG tablet Take 150 mg by mouth daily.    . megestrol (MEGACE) 20 MG tablet Take 1 tablet (20 mg total) by mouth daily. 30 tablet 2  . rizatriptan (MAXALT-MLT) 10 MG disintegrating tablet Take 1 tablet (10 mg total) by mouth as needed. May repeat in 2 hours if needed 15 tablet 6  . sertraline (ZOLOFT) 100 MG tablet Take 150 mg by mouth daily.    Marland Kitchen tiZANidine (ZANAFLEX) 4 MG tablet Take 1 tablet (4 mg total) by mouth every 6 (six) hours as needed for muscle spasms. 30 tablet 0  . VRAYLAR 4.5 MG CAPS   1  . norethindrone (AYGESTIN) 5 MG tablet Take 2 tablets (10 mg total) by mouth daily. (Patient not taking: Reported on 11/13/2017) 14 tablet 0   No current facility-administered  medications on file prior to visit.     Allergies  Allergen Reactions  . Codeine Anaphylaxis    Anything with codeine     Objective:  BP 98/64 (BP Location: Left Arm, Patient Position: Sitting, Cuff Size: Normal)   Pulse 71   Temp 98 F (36.7 C) (Oral)   Resp 16   Ht 5\' 3"  (1.6 m)   Wt 139 lb 6.4 oz (63.2 kg)   LMP 06/07/2014 (Exact Date) Comment: irregular cycles  SpO2 100%   BMI 24.69 kg/m   Physical Exam  Constitutional: She is oriented to person, place, and time. No distress.  HENT:  Right Ear: Tympanic membrane normal.  Left Ear: Tympanic membrane normal.  Nose: Mucosal edema present. No rhinorrhea. Right sinus exhibits no maxillary sinus tenderness and no frontal sinus tenderness. Left sinus exhibits no maxillary sinus tenderness and no frontal sinus tenderness.  Mouth/Throat: Oropharynx is clear and moist and mucous membranes are normal.  Cardiovascular: Normal rate, regular rhythm and normal heart sounds.  Pulmonary/Chest: Effort normal and breath sounds normal. No respiratory distress. She has no wheezes. She has no rales.  Neurological: She is alert and oriented to person, place, and time.  Skin: Skin is warm and dry.  Psychiatric: Judgment normal.  Vitals reviewed.   Assessment and Plan :  1. Lower respiratory infection - azithromycin (ZITHROMAX) 250 MG tablet; Take 2 tabs PO x 1 dose,  then 1 tab PO QD x 4 days  Dispense: 6 tablet; Refill: 0  2. Cough - predniSONE (DELTASONE) 20 MG tablet; Take 2 tablets (40 mg total) by mouth daily with breakfast for 5 days.  Dispense: 10 tablet; Refill: 0 - promethazine-dextromethorphan (PROMETHAZINE-DM) 6.25-15 MG/5ML syrup; Take 5 mLs by mouth 4 (four) times daily as needed for cough.  Dispense: 118 mL; Refill: 0   Lauren Hadiyah Maricle, PA-C  [facility] 11/13/2017 12:29 PM  Please note: Portions of this report may have been transcribed using dragon voice recognition software. Every  effort was made to ensure accuracy; however, inadvertent computerized transcription errors may be present.

## 2017-11-13 NOTE — Patient Instructions (Addendum)
Prednisone for the next 5 days with breakfast. If you are still not feeling better, you can fill the prescription of Azithromycin - this is an antibiotic. Take the entire course of this medication, even if you start to feel better sooner.  Promethazine -dextromethorphan is to help your cough.   Stay well hydrated. Get lost of rest. Wash your hands often.  Come back in 7-10 days if you are not better  -Foods that can help speed recovery: honey, garlic, chicken soup, elderberries, green tea.  -Supplements that can help speed recovery: vitamin C, zinc, elderberry extract, quercetin, ginseng, selenium -Supplement with prebiotics and probiotics:   Cough Syrup Recipe: Sweet Lemon & Honey Thyme  Ingredients . a handful of fresh thyme sprigs   . 1 pint of water (2 cups)  . 1/2 cup honey (raw is best, but regular will do)  . 1/2 lemon chopped Instructions 1. Place the lemon in the pint jar and cover with the honey. The honey will macerate the lemons and draw out liquids which taste so delicious! 2. Meanwhile, toss the thyme leaves into a saucepan and cover them with the water. 3. Bring the water to a gentle simmer and reduce it to half, about a cup of tea. 4. When the tea is reduced and cooled a bit, strain the sprigs & leaves, add it into the pint jar and stir it well. 5. Give it a shake and use a spoonful as needed. 6. Store your homemade cough syrup in the refrigerator for about a month.  What causes a cough?  In adults, common causes of a cough include: ?An infection of the airways or lungs (such as the common cold) ?Postnasal drip - Postnasal drip is when mucus from the nose drips down or flows along the back of the throat. Postnasal drip can happen when people have: .A cold .Allergies .A sinus infection - The sinuses are hollow areas in the bones of the face that open into the nose. ?Lung conditions, like asthma and chronic obstructive pulmonary disease (COPD) - Both of these conditions  can make it hard to breathe. COPD is usually caused by smoking. ?Acid reflux - Acid reflux is when the acid that is normally in your stomach backs up into your esophagus (the tube that carries food from your mouth to your stomach). ?A side effect from blood pressure medicines called "ACE inhibitors" ?Smoking cigarettes  Is there anything I can do on my own to get rid of my cough?  Yes. To help get rid of your cough, you can: ?Use a humidifier in your bedroom ?Use an over-the-counter cough medicine, or suck on cough drops or hard candy ?Stop smoking, if you smoke ?If you have allergies, avoid the things you are allergic to (like pollen, dust, animals, or mold) If you have acid reflux, your doctor or nurse will tell you which lifestyle changes can help reduce symptoms.       IF you received an x-ray today, you will receive an invoice from [facility]. Please contact [facility] at [phone] with questions or concerns regarding your invoice.   IF you received labwork today, you will receive an invoice from [facility]. Please contact [facility] at [phone] with questions or concerns regarding your invoice.   Our billing staff will not be able to assist you with questions regarding bills from these companies.  You will be contacted with the lab results as soon as they are available. The fastest way to get your results is to  activate your My Chart account. Instructions are located on the last page of this paperwork. If you have not heard from [place] regarding the results in 2 weeks, please contact this office.

## 2017-11-29 DIAGNOSIS — F250 Schizoaffective disorder, bipolar type: Secondary | ICD-10-CM | POA: Diagnosis not present

## 2017-12-04 DIAGNOSIS — Z23 Encounter for immunization: Secondary | ICD-10-CM | POA: Diagnosis not present

## 2017-12-04 DIAGNOSIS — F250 Schizoaffective disorder, bipolar type: Secondary | ICD-10-CM | POA: Diagnosis not present

## 2017-12-06 DIAGNOSIS — Z1231 Encounter for screening mammogram for malignant neoplasm of breast: Secondary | ICD-10-CM | POA: Diagnosis not present

## 2017-12-06 LAB — HM MAMMOGRAPHY

## 2017-12-19 DIAGNOSIS — F250 Schizoaffective disorder, bipolar type: Secondary | ICD-10-CM | POA: Diagnosis not present

## 2018-01-01 ENCOUNTER — Other Ambulatory Visit: Payer: Self-pay

## 2018-01-01 ENCOUNTER — Ambulatory Visit (INDEPENDENT_AMBULATORY_CARE_PROVIDER_SITE_OTHER): Payer: Medicare Other | Admitting: Gynecology

## 2018-01-01 ENCOUNTER — Ambulatory Visit (INDEPENDENT_AMBULATORY_CARE_PROVIDER_SITE_OTHER): Payer: Medicare Other

## 2018-01-01 ENCOUNTER — Encounter: Payer: Self-pay | Admitting: Family Medicine

## 2018-01-01 ENCOUNTER — Encounter: Payer: Self-pay | Admitting: Gynecology

## 2018-01-01 ENCOUNTER — Ambulatory Visit: Payer: Medicare Other | Admitting: Gynecology

## 2018-01-01 ENCOUNTER — Other Ambulatory Visit: Payer: Medicare Other

## 2018-01-01 ENCOUNTER — Ambulatory Visit (INDEPENDENT_AMBULATORY_CARE_PROVIDER_SITE_OTHER): Payer: Medicare Other | Admitting: Family Medicine

## 2018-01-01 VITALS — BP 110/70

## 2018-01-01 VITALS — BP 100/68 | HR 75 | Temp 98.9°F | Ht 63.0 in | Wt 147.2 lb

## 2018-01-01 DIAGNOSIS — R05 Cough: Secondary | ICD-10-CM | POA: Diagnosis not present

## 2018-01-01 DIAGNOSIS — N959 Unspecified menopausal and perimenopausal disorder: Secondary | ICD-10-CM | POA: Diagnosis not present

## 2018-01-01 DIAGNOSIS — R059 Cough, unspecified: Secondary | ICD-10-CM

## 2018-01-01 DIAGNOSIS — D259 Leiomyoma of uterus, unspecified: Secondary | ICD-10-CM

## 2018-01-01 DIAGNOSIS — N926 Irregular menstruation, unspecified: Secondary | ICD-10-CM | POA: Diagnosis not present

## 2018-01-01 DIAGNOSIS — N83202 Unspecified ovarian cyst, left side: Secondary | ICD-10-CM | POA: Diagnosis not present

## 2018-01-01 LAB — POCT CBC
Granulocyte percent: 63.3 %G (ref 37–80)
HCT, POC: 40.7 % (ref 29–41)
Hemoglobin: 14 g/dL — AB (ref 9.5–13.5)
Lymph, poc: 2.3 (ref 0.6–3.4)
MCH, POC: 31 pg (ref 27–31.2)
MCHC: 34.3 g/dL (ref 31.8–35.4)
MCV: 90.4 fL (ref 76–111)
MID (cbc): 0.4 (ref 0–0.9)
MPV: 8.3 fL (ref 0–99.8)
POC Granulocyte: 4.7 (ref 2–6.9)
POC LYMPH PERCENT: 30.8 %L (ref 10–50)
POC MID %: 5.9 %M (ref 0–12)
Platelet Count, POC: 225 10*3/uL (ref 142–424)
RBC: 4.5 M/uL (ref 4.04–5.48)
RDW, POC: 14.1 %
WBC: 7.4 10*3/uL (ref 4.6–10.2)

## 2018-01-01 MED ORDER — IPRATROPIUM BROMIDE 0.03 % NA SOLN: 2.0000 | Freq: Two times a day (BID) | NASAL | 0 refills | Status: DC

## 2018-01-01 MED ORDER — MEGESTROL ACETATE 20 MG PO TABS: 20.0000 mg | ORAL_TABLET | Freq: Every day | ORAL | 2 refills | Status: DC

## 2018-01-01 NOTE — Progress Notes (Signed)
    Lauren Chapman May 27, 1970 [id number]        47 y.o.  [id number] presents for follow-up ultrasound.  History of irregular bleeding after 3 years of amenorrhea.  Sonohysterogram 05/2017 showed endometrial echo 10 mm, tri layered with endometrial biopsy showing proliferative endometrium.  FSH was 32.  Follow-up ultrasound showed's left ovary with some small cystic changes.  She was placed on Megace which resolved her bleeding and now presents for follow-up ultrasound.  Past medical history,surgical history, problem list, medications, allergies, family history and social history were all reviewed and documented in the EPIC chart.  Directed ROS with pertinent positives and negatives documented in the history of present illness/assessment and plan.  Exam: Vitals:   01/01/18 1521  BP: 110/70   General appearance:  Normal  Ultrasound transvaginal shows uterus normal size with small myoma measuring 31 mm.  Endometrial echo 4.6 mm.  Left ovary is normal with resolution of the prior noted cysts.  Right ovary with echo-free follicle 25 x 19 x 25 mm.  Small microcalcifications also noted.  Cul-de-sac negative.  Assessment/Plan:  47 y.o. [id number] with perimenopausal changes.  FSH was in the borderline range.  Ovaries still are having some follicular changes.  Currently on Megace with no bleeding.  Will continue on Megace through the holidays then stop beginning of January and keep a menstrual calendar.  If significant irregular bleeding continues and we discussed options to include continued progesterone suppression possibly considering Aygestin 5 mg daily or Mirena IUD.  She does smoke and I do not think a combination oral contraceptives wise.  We reviewed the ultrasound and this is all consistent with functional changes.    Anastasio Auerbach MD, 3:49 PM 01/01/2018

## 2018-01-01 NOTE — Progress Notes (Signed)
12/10/20199:24 AM  Lauren Chapman 1970/04/04, 47 y.o. female [id number]  Chief Complaint  Patient presents with  . Cough    seen in October for the same symptoms, the medication given did not work. Taking no meds now for the cough. Has been coughing for 3 months.  Has been smoking since the age of 47yo 5x a day    HPI:   Patient is a 47 y.o. female with past medical history significant for GERD, bipolar, migraines who presents today for cough   Cough for about 2-3 months Started with a cold in late sept-early oct All day long, sometimes so intense she almost vomits Productive Chest feels tight, hears mild wheezing when she lies down Has noticed difficulty going up stairs, a bit more tired No fever or chills, no night sweats, no weight loss Denies any h/o asthma or COPD Smoker, 5 cig a day, x 35 years  Feels heartburn well controlled with daily nexium Nose always running, clear, no sneezing or itchiness Sometimes feel scratchy throat, drainage Seen in oct, treated with azithromycin, pred, cough meds  Seeing gyn for left ovarian cysts and DUB Last OV aug, gyn not reviewed, sees today later today ebx proliferative. Cysts seems benign per u/s  Fall Risk  01/01/2018 11/13/2017 11/13/2017 09/04/2017 08/21/2017  Falls in the past year? 0 No No No No  Comment - - - - -  Number falls in past yr: - - - - -  Injury with Fall? - - - - -  Comment - - - - -  Risk for fall due to : - - - - -  Risk for fall due to: Comment - - - - -     Depression screen [facility] 2/9 01/01/2018 11/13/2017 11/13/2017  Decreased Interest 0 0 0  Down, Depressed, Hopeless 0 0 0  PHQ - 2 Score 0 0 0  Altered sleeping - - -  Tired, decreased energy - - -  Change in appetite - - -  Feeling bad or failure about yourself  - - -  Trouble concentrating - - -  Moving slowly or fidgety/restless - - -  Suicidal thoughts - - -  PHQ-9 Score - - -  Difficult doing work/chores - - -    Allergies  Allergen  Reactions  . Codeine Anaphylaxis    Anything with codeine    Prior to Admission medications   Medication Sig Start Date End Date Taking? Authorizing Provider  clonazePAM (KLONOPIN) 0.5 MG tablet  12/04/17  Yes [provider]  esomeprazole (NEXIUM) 20 MG capsule Take 20 mg by mouth daily at 12 noon.   Yes [provider]  gabapentin (NEURONTIN) 400 MG capsule Take 800 mg by mouth 3 (three) times daily.  01/13/15  Yes [provider]  lamoTRIgine (LAMICTAL) 150 MG tablet Take 150 mg by mouth daily.   Yes [provider]  megestrol (MEGACE) 20 MG tablet Take 1 tablet (20 mg total) by mouth daily. 10/17/17  Yes Fontaine, Belinda Block, MD  mirtazapine (REMERON) 15 MG tablet  12/04/17  Yes [provider]  norethindrone (AYGESTIN) 5 MG tablet Take 2 tablets (10 mg total) by mouth daily. 05/15/17  Yes Rutherford Guys, MD  rizatriptan (MAXALT-MLT) 10 MG disintegrating tablet Take 1 tablet (10 mg total) by mouth as needed. May repeat in 2 hours if needed 10/02/16  Yes Marcial Pacas, MD  sertraline (ZOLOFT) 100 MG tablet Take 150 mg by mouth daily.   Yes  [provider]  tiZANidine (ZANAFLEX) 4 MG tablet Take 1 tablet (4 mg total) by mouth every 6 (six) hours as needed for muscle spasms. 10/02/16  Yes Marcial Pacas, MD  VRAYLAR 4.5 MG CAPS  07/09/17  Yes [provider]  VRAYLAR capsule  12/04/17  Yes [provider]    Past Medical History:  Diagnosis Date  . Anxiety   . Bipolar affective psychosis ([place])   . Depression   . GERD (gastroesophageal reflux disease)   . Migraine     Past Surgical History:  Procedure Laterality Date  . CARPAL TUNNEL RELEASE    . CHOLECYSTECTOMY    . GANGLION CYST EXCISION     RIGHT wrist  . KNEE ARTHROSCOPY WITH ANTERIOR CRUCIATE LIGAMENT (ACL) REPAIR Right 10/2015    Social History   Tobacco Use  . Smoking status: Current Every Day Smoker    Packs/day: 0.25    Years: 33.00    Pack years: 8.25      Types: Cigarettes    Start date: 10/10/1981  . Smokeless tobacco: Never Used  Substance Use Topics  . Alcohol use: Yes    Alcohol/week: 0.0 standard drinks    Comment: occasionally    Family History  Problem Relation Age of Onset  . Depression Mother   . Cancer Mother        Cervical  . Alcohol abuse Father   . Crohn's disease Sister   . Stroke Brother 67  . Migraines Daughter   . GER disease Daughter   . Depression Daughter   . Migraines Daughter   . GER disease Daughter     ROS   OBJECTIVE:  Blood pressure 100/68, pulse 75, temperature 98.9 F (37.2 C), temperature source Oral, height 5\' 3"  (1.6 m), weight 147 lb 3.2 oz (66.8 kg), last menstrual period 06/07/2014, SpO2 100 %. Body mass index is 26.08 kg/m.   Wt Readings from Last 3 Encounters:  01/01/18 147 lb 3.2 oz (66.8 kg)  11/13/17 139 lb 6.4 oz (63.2 kg)  09/04/17 137 lb (62.1 kg)    Physical Exam  Results for orders placed or performed in visit on 01/01/18 (from the past 24 hour(s))  POCT CBC     Status: Abnormal   Collection Time: 01/01/18  9:39 AM  Result Value Ref Range   WBC 7.4 4.6 - 10.2 K/uL   Lymph, poc 2.3 0.6 - 3.4   POC LYMPH PERCENT 30.8 10 - 50 %L   MID (cbc) 0.4 0 - 0.9   POC MID % 5.9 0 - 12 %M   POC Granulocyte 4.7 2 - 6.9   Granulocyte percent 63.3 37 - 80 %G   RBC 4.50 4.04 - 5.48 M/uL   Hemoglobin 14.0 (A) 9.5 - 13.5 g/dL   HCT, POC 40.7 29 - 41 %   MCV 90.4 76 - 111 fL   MCH, POC 31.0 27 - 31.2 pg   MCHC 34.3 31.8 - 35.4 g/dL   RDW, POC 14.1 %   Platelet Count, POC 225 142 - 424 K/uL   MPV 8.3 0 - 99.8 fL    Dg Chest 2 View  Result Date: 01/01/2018 CLINICAL DATA:  Cough. EXAM: CHEST - 2 VIEW COMPARISON:  Radiographs of March 22, 2015. FINDINGS: The heart size and mediastinal contours are within normal limits. Both lungs are clear. The visualized skeletal structures are unremarkable. IMPRESSION: No active cardiopulmonary disease. Electronically Signed   By: Marijo Conception, M.D.  On: 01/01/2018 09:46     ASSESSMENT and PLAN  1. Cough Exam and CXR are reassuring. Discussed treating PND with nasal spray. pulm eval for PFTs, RTC precautions given - DG Chest 2 View; Future - POCT CBC - Ambulatory referral to Pulmonology  Other orders - ipratropium (ATROVENT) 0.03 % nasal spray; Place 2 sprays into both nostrils 2 (two) times daily.  Return after pulm.    Rutherford Guys, MD [facility] at [place], [place] [address] Ph.  [phone] Fax [phone]

## 2018-01-01 NOTE — Patient Instructions (Signed)
Stop the Megace after the holidays and call if significant irregular bleeding

## 2018-01-01 NOTE — Patient Instructions (Addendum)
If you have lab work done today you will be contacted with your lab results within the next 2 weeks.  If you have not heard from [place] then please contact us. The fastest way to get your results is to register for My Chart.   IF you received an x-ray today, you will receive an invoice from [facility]. Please contact [facility] at [phone] with questions or concerns regarding your invoice.   IF you received labwork today, you will receive an invoice from [facility]. Please contact [facility] at [phone] with questions or concerns regarding your invoice.   Our billing staff will not be able to assist you with questions regarding bills from these companies.  You will be contacted with the lab results as soon as they are available. The fastest way to get your results is to activate your My Chart account. Instructions are located on the last page of this paperwork. If you have not heard from [place] regarding the results in 2 weeks, please contact this office.     Cough, Adult Coughing is a reflex that clears your throat and your airways. Coughing helps to heal and protect your lungs. It is normal to cough occasionally, but a cough that happens with other symptoms or lasts a long time may be a sign of a condition that needs treatment. A cough may last only 2-3 weeks (acute), or it may last longer than 8 weeks (chronic). What are the causes? Coughing is commonly caused by:  Breathing in substances that irritate your lungs.  A viral or bacterial respiratory infection.  Allergies.  Asthma.  Postnasal drip.  Smoking.  Acid backing up from the stomach into the esophagus (gastroesophageal reflux).  Certain medicines.  Chronic lung problems, including COPD (or rarely, lung cancer).  Other medical conditions such as heart failure.  Follow these instructions at home: Pay attention to any changes in your symptoms. Take these actions to help with your  discomfort:  Take medicines only as told by your health care provider. ? If you were prescribed an antibiotic medicine, take it as told by your health care provider. Do not stop taking the antibiotic even if you start to feel better. ? Talk with your health care provider before you take a cough suppressant medicine.  Drink enough fluid to keep your urine clear or pale yellow.  If the air is dry, use a cold steam vaporizer or humidifier in your bedroom or your home to help loosen secretions.  Avoid anything that causes you to cough at work or at home.  If your cough is worse at night, try sleeping in a semi-upright position.  Avoid cigarette smoke. If you smoke, quit smoking. If you need help quitting, ask your health care provider.  Avoid caffeine.  Avoid alcohol.  Rest as needed.  Contact a health care provider if:  You have new symptoms.  You cough up pus.  Your cough does not get better after 2-3 weeks, or your cough gets worse.  You cannot control your cough with suppressant medicines and you are losing sleep.  You develop pain that is getting worse or pain that is not controlled with pain medicines.  You have a fever.  You have unexplained weight loss.  You have night sweats. Get help right away if:  You cough up blood.  You have difficulty breathing.  Your heartbeat is very fast. This information is not intended to replace advice given to you by your health care provider.  Make sure you discuss any questions you have with your health care provider. Document Released: 07/08/2010 Document Revised: 06/17/2015 Document Reviewed: 03/18/2014 Elsevier Interactive Patient Education  [employer].

## 2018-01-10 DIAGNOSIS — F250 Schizoaffective disorder, bipolar type: Secondary | ICD-10-CM | POA: Diagnosis not present

## 2018-01-10 DIAGNOSIS — F3130 Bipolar disorder, current episode depressed, mild or moderate severity, unspecified: Secondary | ICD-10-CM | POA: Diagnosis not present

## 2018-01-14 ENCOUNTER — Telehealth: Payer: Self-pay | Admitting: Family Medicine

## 2018-01-14 NOTE — Telephone Encounter (Signed)
Copied from [place] [id number]. Topic: Quick Communication - See Telephone Encounter >> Jan 14, 2018  9:05 AM Blase Mess A wrote: CRM for notification. See Telephone encounter for: 01/14/18.  Patient is calling to check on the status of a referral with Dr. Pamella Pert on 01/01/18 to pulmonary. Please advise [phone]

## 2018-01-18 ENCOUNTER — Other Ambulatory Visit: Payer: Self-pay | Admitting: Neurology

## 2018-01-24 ENCOUNTER — Telehealth: Payer: Self-pay | Admitting: *Deleted

## 2018-01-24 MED ORDER — NORETHINDRONE ACETATE 5 MG PO TABS: 5.0000 mg | ORAL_TABLET | Freq: Every day | ORAL | 1 refills | Status: DC

## 2018-01-24 NOTE — Telephone Encounter (Signed)
Recommend switching to Aygestin 5 mg daily and schedule an office visit in 1 to 2 months to see how she is doing

## 2018-01-24 NOTE — Telephone Encounter (Signed)
Patient informed, Rx sent.  

## 2018-01-24 NOTE — Telephone Encounter (Signed)
Patient called to report she started bleeding again 2 days ago while on megace, not heavy, has using megace 20 mg daily and no bleeding until now. Asked what to do if medication should be switched?   I re-read the note from visit on 01/01/18 "  Will continue on Megace through the holidays then stop beginning of January and keep a menstrual calendar.  If significant irregular bleeding continues and we discussed options to include continued progesterone suppression possibly considering Aygestin 5 mg daily or Mirena IUD.   Patient would like to know when should she follow up? Monitor this cycle and call if bleeding continues?

## 2018-01-28 DIAGNOSIS — H04123 Dry eye syndrome of bilateral lacrimal glands: Secondary | ICD-10-CM | POA: Diagnosis not present

## 2018-01-28 DIAGNOSIS — H43811 Vitreous degeneration, right eye: Secondary | ICD-10-CM | POA: Diagnosis not present

## 2018-01-28 DIAGNOSIS — H4423 Degenerative myopia, bilateral: Secondary | ICD-10-CM | POA: Diagnosis not present

## 2018-01-28 DIAGNOSIS — H43812 Vitreous degeneration, left eye: Secondary | ICD-10-CM | POA: Diagnosis not present

## 2018-01-29 DIAGNOSIS — F250 Schizoaffective disorder, bipolar type: Secondary | ICD-10-CM | POA: Diagnosis not present

## 2018-01-30 ENCOUNTER — Ambulatory Visit (INDEPENDENT_AMBULATORY_CARE_PROVIDER_SITE_OTHER): Payer: Medicare Other | Admitting: Pulmonary Disease

## 2018-01-30 ENCOUNTER — Encounter: Payer: Self-pay | Admitting: Pulmonary Disease

## 2018-01-30 VITALS — BP 124/68 | HR 74 | Ht 62.25 in | Wt 154.6 lb

## 2018-01-30 DIAGNOSIS — R0602 Shortness of breath: Secondary | ICD-10-CM

## 2018-01-30 DIAGNOSIS — R05 Cough: Secondary | ICD-10-CM

## 2018-01-30 DIAGNOSIS — R062 Wheezing: Secondary | ICD-10-CM | POA: Diagnosis not present

## 2018-01-30 DIAGNOSIS — R059 Cough, unspecified: Secondary | ICD-10-CM

## 2018-01-30 MED ORDER — LEVOFLOXACIN 750 MG PO TABS: 750.0000 mg | ORAL_TABLET | Freq: Every day | ORAL | 0 refills | Status: AC

## 2018-01-30 MED ORDER — ALBUTEROL SULFATE HFA 108 (90 BASE) MCG/ACT IN AERS: 2.0000 | INHALATION_SPRAY | RESPIRATORY_TRACT | 1 refills | Status: DC | PRN

## 2018-01-30 MED ORDER — BUPROPION HCL ER (SR) 150 MG PO TB12: ORAL_TABLET | ORAL | 1 refills | Status: DC

## 2018-01-30 MED ORDER — PREDNISONE 10 MG PO TABS: 10.0000 mg | ORAL_TABLET | Freq: Every day | ORAL | 0 refills | Status: AC

## 2018-01-30 NOTE — Patient Instructions (Signed)
Patient with chronic cough Unresolving symptoms ChestX-ray revealing hyperinflated lung fields  Course of Levaquin 750 daily for 5 days Prednisone 10 p.o. twice daily for 5-day Albuterol as needed  We will obtain a CT scan of the chest secondary to persistent symptoms  Smoking cessation counseling Wellbutrin for smoking cessation  Call with significant symptom  We will see you back in 3 months

## 2018-01-30 NOTE — Addendum Note (Signed)
Addended by: Parke Poisson E on: 01/30/2018 12:23 PM   Modules accepted: Orders

## 2018-01-30 NOTE — Progress Notes (Signed)
Lauren Chapman    [id number]    1970-06-11  Primary Care Physician:Santiago, Lilia Argue, MD  Referring Physician: Rutherford Guys, MD [address] [place], [place] [address]  Chief complaint:   History of persistent cough for few months  HPI:  Persistent cough for few months, did not resolve with use of antibiotics And active smoker, less than half a pack a day No history of asthma, no history of COPD known to patient  Quit smoking for about 2 years in the past, resume smoking related to stress  Denies any chest pains or chest discomfort Denies any hemoptysis No weight loss No fevers or chills Persistent cough with shortness of breath and wheezing  Recent chest x-ray reviewed  She does have a history of bipolar disorder Chronic migraines GERD  Outpatient Encounter Medications as of 01/30/2018  Medication Sig  . clonazePAM (KLONOPIN) 0.5 MG tablet Take 0.5 mg by mouth 3 (three) times daily.   Marland Kitchen esomeprazole (NEXIUM) 20 MG capsule Take 20 mg by mouth daily at 12 noon.  . gabapentin (NEURONTIN) 400 MG capsule Take 800 mg by mouth 3 (three) times daily.   Marland Kitchen lamoTRIgine (LAMICTAL) 150 MG tablet Take 150 mg by mouth daily.  . megestrol (MEGACE) 20 MG tablet Take 1 tablet (20 mg total) by mouth daily.  . mirtazapine (REMERON) 15 MG tablet Take 15 mg by mouth at bedtime.   . norethindrone (AYGESTIN) 5 MG tablet Take 1 tablet (5 mg total) by mouth daily.  . ondansetron (ZOFRAN) 4 MG tablet Take 4 mg by mouth every 8 (eight) hours as needed for nausea or vomiting.  . rizatriptan (MAXALT) 10 MG tablet Take 1 tab at onset of migraine.  May repeat in 2 hrs, if needed.  Max dose: 2 tabs/day. This is a 30 day prescription.  . sertraline (ZOLOFT) 100 MG tablet Take 200 mg by mouth daily.   Marland Kitchen tiZANidine (ZANAFLEX) 4 MG tablet TAKE 1 TABLET BY MOUTH EVERY 6 HOURS AS NEEDED FOR MUSCLE SPASMS.  Marland Kitchen VRAYLAR 4.5 MG CAPS   . ipratropium (ATROVENT) 0.03 % nasal spray Place 2 sprays into both  nostrils 2 (two) times daily. (Patient not taking: Reported on 01/30/2018)   No facility-administered encounter medications on file as of 01/30/2018.     Allergies as of 01/30/2018 - Review Complete 01/30/2018  Allergen Reaction Noted  . Codeine Anaphylaxis 10/11/2011    Past Medical History:  Diagnosis Date  . Anxiety   . Bipolar affective psychosis ([place])   . Depression   . GERD (gastroesophageal reflux disease)   . Migraine     Past Surgical History:  Procedure Laterality Date  . CARPAL TUNNEL RELEASE  2009  . CHOLECYSTECTOMY  1997  . GANGLION CYST EXCISION     RIGHT wrist  . KNEE ARTHROSCOPY WITH ANTERIOR CRUCIATE LIGAMENT (ACL) REPAIR Right 10/2015    Family History  Problem Relation Age of Onset  . Depression Mother   . Cancer Mother        Cervical  . Alcohol abuse Father   . Crohn's disease Sister   . Stroke Brother 2  . Migraines Daughter   . GER disease Daughter   . Depression Daughter   . Migraines Daughter   . GER disease Daughter     Social History   Socioeconomic History  . Marital status: Divorced    Spouse name: n/a  . Number of children: 2  . Years of education:  18  . Highest education level: Not on file  Occupational History  . Occupation: Disabled    Comment: Formerly a Pharmacist, hospital.  Social Needs  . Financial resource strain: Not on file  . Food insecurity:    Worry: Not on file    Inability: Not on file  . Transportation needs:    Medical: Not on file    Non-medical: Not on file  Tobacco Use  . Smoking status: Current Every Day Smoker    Packs/day: 0.50    Years: 33.00    Pack years: 16.50    Types: Cigarettes    Start date: 10/10/1981  . Smokeless tobacco: Never Used  Substance and Sexual Activity  . Alcohol use: Yes    Alcohol/week: 0.0 standard drinks    Comment: occasionally  . Drug use: No  . Sexual activity: Not Currently    Partners: Male    Birth control/protection: None    Comment: 1st intercourse 48 yo-More than 5  partners  Lifestyle  . Physical activity:    Days per week: Not on file    Minutes per session: Not on file  . Stress: Not on file  Relationships  . Social connections:    Talks on phone: Not on file    Gets together: Not on file    Attends religious service: Not on file    Active member of club or organization: Not on file    Attends meetings of clubs or organizations: Not on file    Relationship status: Not on file  . Intimate partner violence:    Fear of current or ex partner: Not on file    Emotionally abused: Not on file    Physically abused: Not on file    Forced sexual activity: Not on file  Other Topics Concern  . Not on file  Social History Narrative   Lives at home with 1 of her two daughters   Right-handed.   2-4 cups caffeine daily.   Disabled     Review of Systems  Constitutional: Negative for appetite change, fever and unexpected weight change.  HENT: Negative.   Eyes: Negative.   Respiratory: Positive for cough, shortness of breath and wheezing.   Cardiovascular: Negative.   Gastrointestinal: Negative.   Endocrine: Negative.     Vitals:   01/30/18 1145  BP: 124/68  Pulse: 74  SpO2: 98%     Physical Exam  Constitutional: She is oriented to person, place, and time. She appears well-developed and well-nourished.  HENT:  Head: Normocephalic.  Eyes: Pupils are equal, round, and reactive to light. Conjunctivae and EOM are normal. Right eye exhibits no discharge. Left eye exhibits no discharge.  Neck: Normal range of motion. Neck supple. No thyromegaly present.  Cardiovascular: Normal rate.  Pulmonary/Chest: Effort normal and breath sounds normal. No respiratory distress. She has no wheezes. She has no rales. She exhibits no tenderness.  Abdominal: Soft. Bowel sounds are normal. She exhibits no distension. There is no abdominal tenderness. There is no rebound.  Musculoskeletal: Normal range of motion.        General: No edema.  Neurological: She is  alert and oriented to person, place, and time. No cranial nerve deficit.  Skin: Skin is warm and dry. No erythema.   Data Reviewed: Recent chest x-ray reviewed showing no acute infiltrate, hyperinflated lung volumes, flattened diaphragms  Assessment:   Persistent cough Possible obstructive lung disease -Denies prior history of asthma -Persistent cough, shortness of breath, wheezing  Active smoker -  Discussed smoking cessation -Wellbutrin did work for her in the past, was able to stay off cigarettes for 2 years, went back to smoking secondary to stress  Plan/Recommendations:  We will prescribe Levaquin 750 daily for 5 days Prednisone 10 p.o. twice daily for 5 days Wellbutrin for smoking cessation 150 daily for 3 days then 150 twice daily We will obtain a pulmonary function study Obtain a CT scan of the chest  Tentatively we will schedule her here for about 3 months Encouraged to call with any significant concerns   Sherrilyn Rist MD  Pulmonary and Critical Care 01/30/2018, 12:00 PM  CC: Rutherford Guys, MD

## 2018-02-04 ENCOUNTER — Telehealth: Payer: Self-pay | Admitting: Pulmonary Disease

## 2018-02-04 ENCOUNTER — Ambulatory Visit (INDEPENDENT_AMBULATORY_CARE_PROVIDER_SITE_OTHER)
Admission: RE | Admit: 2018-02-04 | Discharge: 2018-02-04 | Disposition: A | Discharge status: Home / Self Care | Payer: Medicare Other | Source: Ambulatory Visit | Attending: Pulmonary Disease | Admitting: Pulmonary Disease

## 2018-02-04 DIAGNOSIS — R05 Cough: Secondary | ICD-10-CM

## 2018-02-04 DIAGNOSIS — R059 Cough, unspecified: Secondary | ICD-10-CM

## 2018-02-04 DIAGNOSIS — R0602 Shortness of breath: Secondary | ICD-10-CM | POA: Diagnosis not present

## 2018-02-04 NOTE — Telephone Encounter (Signed)
Called and spoke with Patient.  CT chest was completed this morning, and results are not available at this time.  Explained that someone will contact her once they are resulted. Patient stated understanding.

## 2018-02-07 ENCOUNTER — Telehealth: Payer: Self-pay | Admitting: Pulmonary Disease

## 2018-02-07 DIAGNOSIS — F250 Schizoaffective disorder, bipolar type: Secondary | ICD-10-CM | POA: Diagnosis not present

## 2018-02-07 NOTE — Telephone Encounter (Signed)
Called and spoke with Patient.  She was calling to check on CT chest results.  Ct chest was done 02/04/18. CT has not been resulted at this time.  Advised Patient that someone would call her with results as soon as OA results them.  Understanding stated.  Nothing further at this time.

## 2018-02-13 ENCOUNTER — Telehealth: Payer: Self-pay | Admitting: Pulmonary Disease

## 2018-02-13 ENCOUNTER — Telehealth: Payer: Self-pay | Admitting: *Deleted

## 2018-02-13 MED ORDER — NORETHINDRONE ACETATE 5 MG PO TABS: ORAL_TABLET | ORAL | 0 refills | Status: DC

## 2018-02-13 NOTE — Telephone Encounter (Signed)
Patient returning phone call.  Phone number is [phone].

## 2018-02-13 NOTE — Telephone Encounter (Signed)
Patient called to follow up from telephone call on 01/24/18, taking Aygestin 5 mg daily states last  x 2 weeks the flow has increased to light flow,wearing pad changing only twice daily. I told her this maybe fine since bleeding is not heavy and you may still want her to just follow up in 1-2 months,but I would check. Please advise

## 2018-02-13 NOTE — Telephone Encounter (Signed)
Patient informed, Rx sent for more pills, pt will run out.

## 2018-02-13 NOTE — Telephone Encounter (Signed)
I would double up with Aygestin 10 mg daily.  Do this until bleeding resolves then Aygestin 5 mg daily.  If continues to bleed despite this after a week or so call

## 2018-02-13 NOTE — Telephone Encounter (Signed)
Spoke with pt, advised her that AO was not in the office and that I would route the report to him for review. Pt understood. AO please advise on CT results.

## 2018-02-13 NOTE — Telephone Encounter (Signed)
Normal Ct chest. No masses, no nodules

## 2018-02-13 NOTE — Telephone Encounter (Signed)
Called and spoke with patient she is aware and verbalized understanding. Nothing further needed.  

## 2018-02-22 ENCOUNTER — Ambulatory Visit: Payer: Medicare Other | Admitting: Family Medicine

## 2018-03-05 ENCOUNTER — Encounter: Payer: Self-pay | Admitting: Gynecology

## 2018-03-05 ENCOUNTER — Ambulatory Visit (INDEPENDENT_AMBULATORY_CARE_PROVIDER_SITE_OTHER): Payer: Medicare Other | Admitting: Gynecology

## 2018-03-05 VITALS — BP 118/76

## 2018-03-05 DIAGNOSIS — N951 Menopausal and female climacteric states: Secondary | ICD-10-CM | POA: Diagnosis not present

## 2018-03-05 DIAGNOSIS — N926 Irregular menstruation, unspecified: Secondary | ICD-10-CM

## 2018-03-05 MED ORDER — MEGESTROL ACETATE 20 MG PO TABS: 20.0000 mg | ORAL_TABLET | Freq: Every day | ORAL | 3 refills | Status: DC

## 2018-03-05 NOTE — Progress Notes (Signed)
    Lauren Chapman [birth year] [id number]        48 y.o.  [id number] presents with continued irregular bleeding.  History of amenorrhea for several years and started bleeding heavily.  Evaluation last year included a sonohysterogram 05/2017 which showed small myoma otherwise normal with empty endometrial cavity and an endometrial biopsy showing proliferative endometrium.  FSH was 32.  Was started on Megace for bleeding which ultimately it resolved.  She stopped the Megace in January and started bleeding again starting on Aygestin now at 10 mg daily with light bleeding.  Having no significant pelvic pain or other symptoms.  Past medical history,surgical history, problem list, medications, allergies, family history and social history were all reviewed and documented in the EPIC chart.  Directed ROS with pertinent positives and negatives documented in the history of present illness/assessment and plan.  Exam: Caryn Bee assistant Vitals:   03/05/18 1219  BP: 118/76   General appearance:  Normal Abdomen soft nontender without masses guarding rebound Pelvic external BUS vagina normal.  Cervix normal.  Uterus grossly normal size midline mobile nontender.  Adnexa without masses or tenderness.  Assessment/Plan:  48 y.o. [id number] with perimenopausal bleeding.  Negative work-up within the past year.  Initially controlled on Megace.  Now bleeding with Aygestin.  Gust options to include going back to Megace for now and continuing for the next several months and then stopping hoping to transition through menopause.  Patient smokes and did not think combination hormones wise.  We have discussed the risk of thrombosis with progesterone alone.  Trial of Mirena IUD, endometrial ablation, hysterectomy.  The pros and cons of each choice were discussed.  She wants me to check coverage for Mirena IUD.  For now once Megace 20 mg daily.  We will continue for the next 2 to 3 months then stop.  We will see how she does at that  point.  Ultimately if irregular bleeding continues to be an issue may move towards hysterectomy.    Anastasio Auerbach MD, 12:49 PM 03/05/2018

## 2018-03-05 NOTE — Patient Instructions (Signed)
Start back on the Megace 20 mg daily.  Follow-up if irregular bleeding continues to be an issue.

## 2018-03-14 DIAGNOSIS — F250 Schizoaffective disorder, bipolar type: Secondary | ICD-10-CM | POA: Diagnosis not present

## 2018-03-19 ENCOUNTER — Telehealth: Payer: Self-pay | Admitting: *Deleted

## 2018-03-19 NOTE — Telephone Encounter (Signed)
Patient informed, she has enough for now, will call if additional Rx needed.

## 2018-03-19 NOTE — Telephone Encounter (Signed)
Okay to increase to twice daily for 1 week.  Can refill #30 with 1 refill on top of that

## 2018-03-19 NOTE — Telephone Encounter (Signed)
Patient was seen on OV 03/05/18 for irregular bleeding, prescribed megace 20 mg tablet 1 po daily, still notes light bleeding/spotting daily. Asked if she could take 1 po twice daily for couple of days, then daily and see how this does with flow? If yes she will need new Rx because she will run out of medication early and pharmacy will not fill. She is on medicare and not able to afford Mirena IUD.   Please advise

## 2018-04-02 ENCOUNTER — Ambulatory Visit (INDEPENDENT_AMBULATORY_CARE_PROVIDER_SITE_OTHER): Payer: Medicare Other | Admitting: Women's Health

## 2018-04-02 ENCOUNTER — Encounter: Payer: Self-pay | Admitting: Women's Health

## 2018-04-02 ENCOUNTER — Telehealth: Payer: Self-pay | Admitting: *Deleted

## 2018-04-02 VITALS — BP 120/78

## 2018-04-02 DIAGNOSIS — N938 Other specified abnormal uterine and vaginal bleeding: Secondary | ICD-10-CM

## 2018-04-02 LAB — WET PREP FOR TRICH, YEAST, CLUE

## 2018-04-02 MED ORDER — NORETHINDRONE 0.35 MG PO TABS: 1.0000 | ORAL_TABLET | Freq: Every day | ORAL | 0 refills | Status: DC

## 2018-04-02 NOTE — Telephone Encounter (Signed)
Patient called c/o gray tissues coming out of vagina when wiping still has light bleeding , asked what this could be. I explained I am not sure but she can schedule a visit with provider to be checked, transferred to appointment desk.

## 2018-04-02 NOTE — Progress Notes (Signed)
48 year old WF [id number] presents with complaint of "grey tissue coming from vagina" with bleeding for 1 week. Began having heavy bleeding a several months ago after 3 years of amenorrhea. Treated with Megace with resolution but stopped it in January 2020 again having light bleeding started on Aygestin .  Denies vaginal itching or odor, no urinary symptoms other than stress incontinence that is not new for her. No pain, nausea, changes in GI function or fever.. No sexual activity for >2 years.  05/2017 negative sonohysterogram showing several small fibroids, negative biopsy, endometrium Tri layered with functional left ovarian cysts, follow-up ultrasound cyst resolved.  [place] 32.  Is on numerous antianxiety/depression meds that may have contributed to irregular cycles/amenorrhea.  On disability for anxiety.     Exam: Appears well. Abdomen soft, nontender without masses, guarding, or rebound. External vagina normal, cervix normal without cervical motion tenderness, light bleeding present. Uterus normal, no tenderness.  Wet prep negative  Vaginal bleeding, perimenopausal   Anxiety/depression Smoker  Plan: Smoker, avoid estrogen due to risk for blood clots. Start Micronor 2 tablets today then 1 tablet daily, follow up appointment scheduled in May with Dr. Phineas Real.  Instructed to call if continued bleeding/spotting.

## 2018-04-15 ENCOUNTER — Telehealth: Payer: Self-pay | Admitting: *Deleted

## 2018-04-15 ENCOUNTER — Telehealth: Payer: Self-pay | Admitting: Neurology

## 2018-04-15 NOTE — Telephone Encounter (Signed)
Left second message letting her know that her appt has been canceled due to Covid-19 precautions.  We are able to offer her a telephone visit or we can reschedule her in-office appt to a later date.  I provided our number to call back to let us know how to proceed.

## 2018-04-15 NOTE — Telephone Encounter (Signed)
Left message for patient

## 2018-04-16 ENCOUNTER — Telehealth: Payer: Self-pay | Admitting: Neurology

## 2018-04-18 ENCOUNTER — Ambulatory Visit (INDEPENDENT_AMBULATORY_CARE_PROVIDER_SITE_OTHER): Payer: Medicare Other | Admitting: Family Medicine

## 2018-04-18 ENCOUNTER — Ambulatory Visit: Payer: Medicare Other | Admitting: Family Medicine

## 2018-04-18 ENCOUNTER — Other Ambulatory Visit: Payer: Self-pay

## 2018-04-18 ENCOUNTER — Ambulatory Visit (INDEPENDENT_AMBULATORY_CARE_PROVIDER_SITE_OTHER): Payer: Medicare Other

## 2018-04-18 ENCOUNTER — Encounter: Payer: Self-pay | Admitting: Family Medicine

## 2018-04-18 VITALS — BP 108/76 | HR 72 | Temp 98.0°F | Ht 62.0 in | Wt 162.2 lb

## 2018-04-18 DIAGNOSIS — R3989 Other symptoms and signs involving the genitourinary system: Secondary | ICD-10-CM

## 2018-04-18 DIAGNOSIS — R319 Hematuria, unspecified: Secondary | ICD-10-CM

## 2018-04-18 DIAGNOSIS — R350 Frequency of micturition: Secondary | ICD-10-CM

## 2018-04-18 DIAGNOSIS — Z87448 Personal history of other diseases of urinary system: Secondary | ICD-10-CM | POA: Diagnosis not present

## 2018-04-18 LAB — POC MICROSCOPIC URINALYSIS (UMFC): Mucus: ABSENT

## 2018-04-18 LAB — POCT URINALYSIS DIP (MANUAL ENTRY)
Bilirubin, UA: NEGATIVE
Glucose, UA: NEGATIVE mg/dL
Ketones, POC UA: NEGATIVE mg/dL
Nitrite, UA: NEGATIVE
Protein Ur, POC: 30 mg/dL — AB
Spec Grav, UA: 1.015 (ref 1.010–1.025)
Urobilinogen, UA: 0.2 E.U./dL
pH, UA: 6.5 (ref 5.0–8.0)

## 2018-04-18 MED ORDER — NITROFURANTOIN MONOHYD MACRO 100 MG PO CAPS: 100.0000 mg | ORAL_CAPSULE | Freq: Two times a day (BID) | ORAL | 0 refills | Status: DC

## 2018-04-18 NOTE — Patient Instructions (Signed)
° ° ° °  If you have lab work done today you will be contacted with your lab results within the next 2 weeks.  If you have not heard from us then please contact us. The fastest way to get your results is to register for My Chart. ° ° °IF you received an x-ray today, you will receive an invoice from [facility]. Please contact [facility] at [phone] with questions or concerns regarding your invoice.  ° °IF you received labwork today, you will receive an invoice from [facility]. Please contact [facility] at [phone] with questions or concerns regarding your invoice.  ° °Our billing staff will not be able to assist you with questions regarding bills from these companies. ° °You will be contacted with the lab results as soon as they are available. The fastest way to get your results is to activate your My Chart account. Instructions are located on the last page of this paperwork. If you have not heard from us regarding the results in 2 weeks, please contact this office. °  ° ° ° °

## 2018-04-18 NOTE — Progress Notes (Signed)
3/26/20203:40 PM  Lauren Chapman [birth year], 48 y.o., female [id number]  Chief Complaint  Patient presents with  . Polyuria    haviing frequent urine flow along with some rentention at the end of urine flow. This has been going on for  4 days now. Using cranberry juice for her symptoms    HPI:   Patient is a 48 y.o. female who presents today with concerns for UTI  Patient reports 4 days of urinary frequency with mild end stream retention Denies any hematuria or dysuria Denies any fever or chills or flank pain Denies any h/o recurrent UTI No h/o kidney stones Has been drinking cranberry juice  Fall Risk  04/18/2018 01/01/2018 11/13/2017 11/13/2017 09/04/2017  Falls in the past year? 0 0 No No No  Comment - - - - -  Number falls in past yr: 0 - - - -  Injury with Fall? 0 - - - -  Comment - - - - -  Risk for fall due to : - - - - -  Risk for fall due to: Comment - - - - -     Depression screen [facility] 2/9 04/18/2018 01/01/2018 11/13/2017  Decreased Interest 0 0 0  Down, Depressed, Hopeless 0 0 0  PHQ - 2 Score 0 0 0  Altered sleeping - - -  Tired, decreased energy - - -  Change in appetite - - -  Feeling bad or failure about yourself  - - -  Trouble concentrating - - -  Moving slowly or fidgety/restless - - -  Suicidal thoughts - - -  PHQ-9 Score - - -  Difficult doing work/chores - - -    Allergies  Allergen Reactions  . Codeine Anaphylaxis    Anything with codeine    Prior to Admission medications   Medication Sig Start Date End Date Taking? Authorizing Provider  albuterol (PROVENTIL HFA;VENTOLIN HFA) 108 (90 Base) MCG/ACT inhaler Inhale 2 puffs into the lungs every 4 (four) hours as needed for wheezing or shortness of breath. 01/30/18  Yes Olalere, Adewale A, MD  buPROPion (WELLBUTRIN SR) 150 MG 12 hr tablet 1 tab by mouth once daily x3 days, then 1 tab by mouth twice daily for smoking cessation 01/30/18  Yes Olalere, Adewale A, MD  clonazePAM (KLONOPIN) 0.5 MG tablet  Take 0.5 mg by mouth 3 (three) times daily.  12/04/17  Yes [provider]  esomeprazole (NEXIUM) 20 MG capsule Take 20 mg by mouth daily at 12 noon.   Yes [provider]  gabapentin (NEURONTIN) 400 MG capsule Take 800 mg by mouth 3 (three) times daily.  01/13/15  Yes [provider]  lamoTRIgine (LAMICTAL) 150 MG tablet Take 150 mg by mouth daily.   Yes [provider]  megestrol (MEGACE) 20 MG tablet Take 1 tablet (20 mg total) by mouth daily. 03/05/18  Yes Fontaine, Belinda Block, MD  mirtazapine (REMERON) 15 MG tablet Take 15 mg by mouth at bedtime.  12/04/17  Yes [provider]  norethindrone (AYGESTIN) 5 MG tablet Take one tablet by mouth twice daily until bleeding resolves, then take one tablet daily. 02/13/18  Yes Fontaine, Belinda Block, MD  norethindrone (ORTHO MICRONOR) 0.35 MG tablet Take 1 tablet (0.35 mg total) by mouth daily. 04/02/18  Yes Huel Cote, NP  ondansetron (ZOFRAN) 4 MG tablet Take 4 mg by mouth every 8 (eight) hours as needed for nausea or vomiting.   Yes [provider]  rizatriptan (MAXALT) 10  MG tablet Take 1 tab at onset of migraine.  May repeat in 2 hrs, if needed.  Max dose: 2 tabs/day. This is a 30 day prescription. 01/18/18  Yes Marcial Pacas, MD  sertraline (ZOLOFT) 100 MG tablet Take 200 mg by mouth daily.    Yes [provider]  tiZANidine (ZANAFLEX) 4 MG tablet TAKE 1 TABLET BY MOUTH EVERY 6 HOURS AS NEEDED FOR MUSCLE SPASMS. 01/18/18  Yes Marcial Pacas, MD  VRAYLAR 4.5 MG CAPS  07/09/17  Yes [provider]    Past Medical History:  Diagnosis Date  . Anxiety   . Bipolar affective psychosis ([place])   . Depression   . GERD (gastroesophageal reflux disease)   . Migraine     Past Surgical History:  Procedure Laterality Date  . CARPAL TUNNEL RELEASE  2009  . CHOLECYSTECTOMY  1997  . GANGLION CYST EXCISION     RIGHT wrist  . KNEE ARTHROSCOPY WITH ANTERIOR CRUCIATE LIGAMENT (ACL) REPAIR Right  10/2015    Social History   Tobacco Use  . Smoking status: Current Every Day Smoker    Packs/day: 0.50    Years: 33.00    Pack years: 16.50    Types: Cigarettes    Start date: 10/10/1981  . Smokeless tobacco: Never Used  Substance Use Topics  . Alcohol use: Yes    Alcohol/week: 0.0 standard drinks    Comment: occasionally    Family History  Problem Relation Age of Onset  . Depression Mother   . Cancer Mother        Cervical  . Alcohol abuse Father   . Crohn's disease Sister   . Stroke Brother 62  . Migraines Daughter   . GER disease Daughter   . Depression Daughter   . Migraines Daughter   . GER disease Daughter     ROS Per hpi  OBJECTIVE:  Today's Vitals   04/18/18 1533  BP: 108/76  Pulse: 72  Temp: 98 F (36.7 C)  TempSrc: Oral  SpO2: 98%  Weight: 162 lb 3.2 oz (73.6 kg)  Height: 5\' 2"  (1.575 m)   Body mass index is 29.67 kg/m.   Physical Exam Vitals signs and nursing note reviewed.  Constitutional:      Appearance: She is well-developed.  HENT:     Head: Normocephalic and atraumatic.  Eyes:     General: No scleral icterus.    Conjunctiva/sclera: Conjunctivae normal.     Pupils: Pupils are equal, round, and reactive to light.  Neck:     Musculoskeletal: Neck supple.  Pulmonary:     Effort: Pulmonary effort is normal.  Abdominal:     General: Bowel sounds are normal.     Palpations: Abdomen is soft.     Tenderness: There is abdominal tenderness in the right upper quadrant and left upper quadrant. There is no right CVA tenderness, left CVA tenderness, guarding or rebound.  Skin:    General: Skin is warm and dry.  Neurological:     Mental Status: She is alert and oriented to person, place, and time.     Results for orders placed or performed in visit on 04/18/18 (from the past 24 hour(s))  POCT Microscopic Urinalysis ([facility])     Status: Abnormal   Collection Time: 04/18/18  3:41 PM  Result Value Ref Range   WBC,UR,HPF,POC Many (A) None  WBC/hpf   RBC,UR,HPF,POC Moderate (A) None RBC/hpf   Bacteria Few (A) None, Too numerous to count   Mucus Absent Absent  Epithelial Cells, UR Per Microscopy Few (A) None, Too numerous to count cells/hpf   POCT UA:  Normal spec grav, large blood, 30 protein, small LCE, neg nitrates   Dg Abd 2 Views  Result Date: 04/18/2018 CLINICAL DATA:  48 year old female with a history urinary frequency and hematuria EXAM: ABDOMEN - 2 VIEW COMPARISON:  CT 11/17/2005 FINDINGS: Surgical changes of cholecystectomy. No unexpected soft tissue density or calcification. No calcifications overlying the renal silhouette on the left or the right. Gas within stomach, small bowel, colon.  No abnormal distention. No radiopaque foreign body. No displaced fracture IMPRESSION: Normal bowel gas pattern. No evidence of nephrolithiasis. Electronically Signed   By: Corrie Mckusick D.O.   On: 04/18/2018 16:09     ASSESSMENT and PLAN  1. Urinary frequency Discussed supportive measures, new meds r/se/b and RTC precautions. - DG Abd 2 Views; Future - Urine Culture  2. Hematuria, unspecified type - POCT Microscopic Urinalysis ([facility]) - DG Abd 2 Views; Future  Other orders - nitrofurantoin, macrocrystal-monohydrate, (MACROBID) 100 MG capsule; Take 1 capsule (100 mg total) by mouth 2 (two) times daily.  Return if symptoms worsen or fail to improve.    Rutherford Guys, MD [facility] [place], [place] [address] Ph.  [phone] Fax [phone]

## 2018-04-18 NOTE — Progress Notes (Deleted)
Virtual Visit via telephone Note  I connected with patient on 04/18/18 at *** by telephone and verified that I am speaking with the correct person using two identifiers. Lauren Chapman is currently located at *** and {family members:[id number]} is currently with her during visit. The provider, Rutherford Guys, MD is located in their office at time of visit.  I discussed the limitations, risks, security and privacy concerns of performing an evaluation and management service by telephone and the availability of in person appointments. I also discussed with the patient that there may be a patient responsible charge related to this service. The patient expressed understanding and agreed to proceed.  No chief complaint on file.   Telephone visit today for ***  HPI ?  Fall Risk  01/01/2018 11/13/2017 11/13/2017 09/04/2017 08/21/2017  Falls in the past year? 0 No No No No  Comment - - - - -  Number falls in past yr: - - - - -  Injury with Fall? - - - - -  Comment - - - - -  Risk for fall due to : - - - - -  Risk for fall due to: Comment - - - - -     Depression screen [facility] 2/9 01/01/2018 11/13/2017 11/13/2017  Decreased Interest 0 0 0  Down, Depressed, Hopeless 0 0 0  PHQ - 2 Score 0 0 0  Altered sleeping - - -  Tired, decreased energy - - -  Change in appetite - - -  Feeling bad or failure about yourself  - - -  Trouble concentrating - - -  Moving slowly or fidgety/restless - - -  Suicidal thoughts - - -  PHQ-9 Score - - -  Difficult doing work/chores - - -    Allergies  Allergen Reactions  . Codeine Anaphylaxis    Anything with codeine    Prior to Admission medications   Medication Sig Start Date End Date Taking? Authorizing Provider  albuterol (PROVENTIL HFA;VENTOLIN HFA) 108 (90 Base) MCG/ACT inhaler Inhale 2 puffs into the lungs every 4 (four) hours as needed for wheezing or shortness of breath. 01/30/18   Laurin Coder, MD  buPROPion (WELLBUTRIN SR) 150 MG 12 hr tablet  1 tab by mouth once daily x3 days, then 1 tab by mouth twice daily for smoking cessation 01/30/18   Sherrilyn Rist A, MD  clonazePAM (KLONOPIN) 0.5 MG tablet Take 0.5 mg by mouth 3 (three) times daily.  12/04/17   [provider]  esomeprazole (NEXIUM) 20 MG capsule Take 20 mg by mouth daily at 12 noon.    [provider]  gabapentin (NEURONTIN) 400 MG capsule Take 800 mg by mouth 3 (three) times daily.  01/13/15   [provider]  lamoTRIgine (LAMICTAL) 150 MG tablet Take 150 mg by mouth daily.    [provider]  megestrol (MEGACE) 20 MG tablet Take 1 tablet (20 mg total) by mouth daily. 03/05/18   Fontaine, Belinda Block, MD  mirtazapine (REMERON) 15 MG tablet Take 15 mg by mouth at bedtime.  12/04/17   [provider]  norethindrone (AYGESTIN) 5 MG tablet Take one tablet by mouth twice daily until bleeding resolves, then take one tablet daily. 02/13/18   Fontaine, Belinda Block, MD  norethindrone (ORTHO MICRONOR) 0.35 MG tablet Take 1 tablet (0.35 mg total) by mouth daily. 04/02/18   Huel Cote, NP  ondansetron (ZOFRAN) 4 MG tablet Take 4 mg by mouth every 8 (eight) hours  as needed for nausea or vomiting.    [provider]  rizatriptan (MAXALT) 10 MG tablet Take 1 tab at onset of migraine.  May repeat in 2 hrs, if needed.  Max dose: 2 tabs/day. This is a 30 day prescription. 01/18/18   Marcial Pacas, MD  sertraline (ZOLOFT) 100 MG tablet Take 200 mg by mouth daily.     [provider]  tiZANidine (ZANAFLEX) 4 MG tablet TAKE 1 TABLET BY MOUTH EVERY 6 HOURS AS NEEDED FOR MUSCLE SPASMS. 01/18/18   Marcial Pacas, MD  VRAYLAR 4.5 MG CAPS  07/09/17   [provider]    Past Medical History:  Diagnosis Date  . Anxiety   . Bipolar affective psychosis ([place])   . Depression   . GERD (gastroesophageal reflux disease)   . Migraine     Past Surgical History:  Procedure Laterality Date  . CARPAL TUNNEL RELEASE  2009  . CHOLECYSTECTOMY  1997   . GANGLION CYST EXCISION     RIGHT wrist  . KNEE ARTHROSCOPY WITH ANTERIOR CRUCIATE LIGAMENT (ACL) REPAIR Right 10/2015    Social History   Tobacco Use  . Smoking status: Current Every Day Smoker    Packs/day: 0.50    Years: 33.00    Pack years: 16.50    Types: Cigarettes    Start date: 10/10/1981  . Smokeless tobacco: Never Used  Substance Use Topics  . Alcohol use: Yes    Alcohol/week: 0.0 standard drinks    Comment: occasionally    Family History  Problem Relation Age of Onset  . Depression Mother   . Cancer Mother        Cervical  . Alcohol abuse Father   . Crohn's disease Sister   . Stroke Brother 36  . Migraines Daughter   . GER disease Daughter   . Depression Daughter   . Migraines Daughter   . GER disease Daughter     ROS  Objective  Vitals as reported by the patient: none   No results found for this or any previous visit (from the past 24 hour(s)).  ASSESSMENT and PLAN  ***  FOLLOW-UP: ***   The above assessment and management plan was discussed with the patient. The patient verbalized understanding of and has agreed to the management plan. Patient is aware to call the clinic if symptoms persist or worsen. Patient is aware when to return to the clinic for a follow-up visit. Patient educated on when it is appropriate to go to the emergency department.    I provided *** minutes of non-face-to-face time during this encounter.  Rutherford Guys, MD Primary Care at [place], [place] [address] Ph.  [phone] Fax [phone]

## 2018-04-19 LAB — URINE CULTURE

## 2018-04-23 ENCOUNTER — Telehealth: Payer: Self-pay | Admitting: Pulmonary Disease

## 2018-04-23 NOTE — Telephone Encounter (Signed)
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 Her supervisors are concerned about her emphysema and working around the public with the coronavirus.  Patient confirmed the store has put plexiglass barriers between the cashiers and customers. But no face masks are being provided to employees. Patient said she was told by her supervisors they are only used if someone is sick to keep from spreading it.  Patient wanted to know if Dr. Ander Slade would write a letter she can give to her employer regarding her emphysema and if it is safe for her to work during the coronavirus outbreak.  I told the patient her request would be sent to Dr. Ander Slade, but that he is in the hospital and when he is able to respond regarding the letter we will let her know. Patient agreed.  I suggested to her she contact her human resources department to find out what their rules are for taking time off of work or being written out of work are because of this situation.  Patient agreed to get the correct name of the supervisor the letter can be sent to if Dr. Ander Slade approves.  Message above routed to Dr. Ander Slade to review and respond.  Dr. Ander Slade, please see above, thank you much.

## 2018-04-24 NOTE — Telephone Encounter (Signed)
Letter can be provided for patient to be reassigned  Having emphysema does not preclude her from being able to work, maintain various barriers/cautions to reduce chances of exposure. She may use a facemask if she chooses

## 2018-04-24 NOTE — Telephone Encounter (Signed)
Pt made of options She was informed a letter could be written to be reassigned. 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 Told her she could wear a mask and try to maintain barriers to reduce chances of exposure. Pt verbalized understanding. Nothing further needed.

## 2018-04-24 NOTE — Telephone Encounter (Signed)
Patient is returning phone call.  Patient phone number is [phone].

## 2018-04-24 NOTE — Telephone Encounter (Signed)
LMTCB

## 2018-04-25 DIAGNOSIS — F250 Schizoaffective disorder, bipolar type: Secondary | ICD-10-CM | POA: Diagnosis not present

## 2018-04-26 ENCOUNTER — Ambulatory Visit: Payer: Self-pay | Admitting: Pulmonary Disease

## 2018-05-01 ENCOUNTER — Ambulatory Visit: Payer: Self-pay | Admitting: Pulmonary Disease

## 2018-05-01 ENCOUNTER — Ambulatory Visit: Payer: Self-pay

## 2018-05-07 ENCOUNTER — Ambulatory Visit: Payer: Self-pay

## 2018-05-07 NOTE — Telephone Encounter (Signed)
Pt. Called to report headache, runny nose, sore throat, feeling tired, shortness of breath, intermittent wheezing with laying down, and a dry cough.  Reported the symptoms started last night.  Stated she has hx of migraine headaches, but feels this is different than her typical migraines. Stated she works in [employer], and is unsure if she has been exposed to someone with Coronavirus.  Reported she is somewhat short of breath at baseline, with hx of emphysema.  Stated that has increased shortness of breath and wheezing if she is laying down.  Also, reported her cough is "more dry now", compared to typical productive cough she usually has.  Unsure if she has fever.  Stated she feels like her palms are sweaty. Denied feeling chills or body aches.  Care advice given per protocol; verb. Understanding.  Called Flow Coordinator; transferred pt. to the [facility] to have an appt. scheduled.      Reason for Disposition . MILD difficulty breathing (e.g., minimal/no SOB at rest, SOB with walking, pulse <100)  Answer Assessment - Initial Assessment Questions 1. COVID-19 DIAGNOSIS: "Who made your Coronavirus (COVID-19) diagnosis?" "Was it confirmed by a positive lab test?" If not diagnosed by a HCP, ask "Are there lots of cases (community spread) where you live?" (See public health department website, if unsure)   * MAJOR community spread: high number of cases; numbers of cases are increasing; many people hospitalized.   * MINOR community spread: low number of cases; not increasing; few or no people hospitalized     minor 2. ONSET: "When did the COVID-19 symptoms start?"      Last night, 4/13 3. WORST SYMPTOM: "What is your worst symptom?" (e.g., cough, fever, shortness of breath, muscle aches)     Headache and shortness of breath 4. COUGH: "How bad is the cough?"       "I cough al the time, but it's been a little bit drier." 5. FEVER: "Do you have a fever?" If so, ask: "What is your temperature, how was it  measured, and when did it start?"     Feels sweaty, denied chills or body aches  6. RESPIRATORY STATUS: "Describe your breathing?" (e.g., shortness of breath, wheezing, unable to speak)      Stated has shortness of breath at baseline; feels the shortness of breath is increased with laying down, and wheezing with laying down 7. BETTER-SAME-WORSE: "Are you getting better, staying the same or getting worse compared to yesterday?"  If getting worse, ask, "In what way?"     Onset last night; feeling more tired today; c/o increased headache 8. HIGH RISK DISEASE: "Do you have any chronic medical problems?" (e.g., asthma, heart or lung disease, weak immune system, etc.)     Emphysema 9. PREGNANCY: "Is there any chance you are pregnant?" "When was your last menstrual period?"     Stated no; did not have menstrual cycles for about 3 yrs.  Now being monitored for vaginal bleeding 10. OTHER SYMPTOMS: "Do you have any other symptoms?"  (e.g., runny nose, headache, sore throat, loss of smell)       HA @ 8/10 ; headache is different than typical migraine, runny nose, sore throat, increased shortness of breath, intermittent wheezing if lays down, dry cough with increased frequency  Protocols used: CORONAVIRUS (COVID-19) DIAGNOSED OR SUSPECTED-A-AH

## 2018-05-08 NOTE — Telephone Encounter (Signed)
Pt has appointment tomorrow for evaluation.

## 2018-05-09 ENCOUNTER — Other Ambulatory Visit: Payer: Self-pay

## 2018-05-09 ENCOUNTER — Telehealth (INDEPENDENT_AMBULATORY_CARE_PROVIDER_SITE_OTHER): Payer: Medicare Other | Admitting: Family Medicine

## 2018-05-09 DIAGNOSIS — J22 Unspecified acute lower respiratory infection: Secondary | ICD-10-CM | POA: Diagnosis not present

## 2018-05-09 MED ORDER — PREDNISONE 20 MG PO TABS: 40.0000 mg | ORAL_TABLET | Freq: Every day | ORAL | 0 refills | Status: DC

## 2018-05-09 MED ORDER — AZITHROMYCIN 250 MG PO TABS: ORAL_TABLET | ORAL | 0 refills | Status: AC

## 2018-05-09 NOTE — Progress Notes (Signed)
Virtual Visit via telephone Note  I connected with patient on 05/09/18 at 354pm by telephone and verified that I am speaking with the correct person using two identifiers. Lauren Chapman is currently located at home and patient is currently with her during visit. The provider, Rutherford Guys, MD is located in their office at time of visit.  I discussed the limitations, risks, security and privacy concerns of performing an evaluation and management service by telephone and the availability of in person appointments. I also discussed with the patient that there may be a patient responsible charge related to this service. The patient expressed understanding and agreed to proceed.   Telephone visit today for X 3 days  headache,runny nose, wheezing, and sore throat  HPI Has had occipital headache, sore throat, body aches, nasal congestion, mostly dry cough, SOB, wheezing mostly when she lies down, malaise x 3 days Feel down stairs this morning - not sure what happened Albuterol helps with SOB No fever, ear pain, diarrhea No sneezing, itchy eyes or watery eyes Took benadryl, Excedrin, migraine,  which did not help Works as a Scientist, water quality smokes  Recently saw pulm, dx with emphysema, PFTs have been cancelled due to covid, only has albuterol ? PMH: GERD, bipolar disorder, migraines, seasonal allergies, epmphysema  Sees Dr Reece Levy for psychiatry Sees Dr Ander Slade for pulmonary  Fall Risk  05/09/2018 04/18/2018 01/01/2018 11/13/2017 11/13/2017  Falls in the past year? 1 0 0 No No  Comment - - - - -  Number falls in past yr: 0 0 - - -  Comment on 05/09/2018 - - - -  Injury with Fall? 1 0 - - -  Comment left side wrist and elbow 05/09/2018 - - - -  Risk for fall due to : - - - - -  Risk for fall due to: Comment - - - - -     Depression screen [facility] 2/9 05/09/2018 04/18/2018 01/01/2018  Decreased Interest 1 0 0  Down, Depressed, Hopeless 1 0 0  PHQ - 2 Score 2 0 0  Altered sleeping 2 - -  Tired,  decreased energy 1 - -  Change in appetite 3 - -  Feeling bad or failure about yourself  3 - -  Trouble concentrating 0 - -  Moving slowly or fidgety/restless 2 - -  Suicidal thoughts 0 - -  PHQ-9 Score 13 - -  Difficult doing work/chores Not difficult at all - -    Allergies  Allergen Reactions  . Codeine Anaphylaxis    Anything with codeine    Prior to Admission medications   Medication Sig Start Date End Date Taking? Authorizing Provider  albuterol (PROVENTIL HFA;VENTOLIN HFA) 108 (90 Base) MCG/ACT inhaler Inhale 2 puffs into the lungs every 4 (four) hours as needed for wheezing or shortness of breath. 01/30/18  Yes Olalere, Adewale A, MD  clonazePAM (KLONOPIN) 0.5 MG tablet Take 0.5 mg by mouth 3 (three) times daily.  12/04/17  Yes [provider]  esomeprazole (NEXIUM) 20 MG capsule Take 20 mg by mouth daily at 12 noon.   Yes [provider]  gabapentin (NEURONTIN) 400 MG capsule Take 800 mg by mouth 3 (three) times daily.  01/13/15  Yes [provider]  lamoTRIgine (LAMICTAL) 150 MG tablet Take 150 mg by mouth daily.   Yes [provider]  mirtazapine (REMERON) 15 MG tablet Take 15 mg by mouth at bedtime.  12/04/17  Yes [provider]  nitrofurantoin, macrocrystal-monohydrate, (MACROBID)  100 MG capsule Take 1 capsule (100 mg total) by mouth 2 (two) times daily. 04/18/18  Yes Rutherford Guys, MD  norethindrone (ORTHO MICRONOR) 0.35 MG tablet Take 1 tablet (0.35 mg total) by mouth daily. 04/02/18  Yes Huel Cote, NP  ondansetron (ZOFRAN) 4 MG tablet Take 4 mg by mouth every 8 (eight) hours as needed for nausea or vomiting.   Yes [provider]  rizatriptan (MAXALT) 10 MG tablet Take 1 tab at onset of migraine.  May repeat in 2 hrs, if needed.  Max dose: 2 tabs/day. This is a 30 day prescription. 01/18/18  Yes Marcial Pacas, MD  sertraline (ZOLOFT) 100 MG tablet Take 200 mg by mouth daily.    Yes [provider]   tiZANidine (ZANAFLEX) 4 MG tablet TAKE 1 TABLET BY MOUTH EVERY 6 HOURS AS NEEDED FOR MUSCLE SPASMS. 01/18/18  Yes Marcial Pacas, MD  VRAYLAR 4.5 MG CAPS  07/09/17  Yes [provider]  buPROPion (WELLBUTRIN SR) 150 MG 12 hr tablet 1 tab by mouth once daily x3 days, then 1 tab by mouth twice daily for smoking cessation Patient not taking: Reported on 05/09/2018 01/30/18   Laurin Coder, MD  megestrol (MEGACE) 20 MG tablet Take 1 tablet (20 mg total) by mouth daily. Patient not taking: Reported on 05/09/2018 03/05/18   Fontaine, Belinda Block, MD  norethindrone (AYGESTIN) 5 MG tablet Take one tablet by mouth twice daily until bleeding resolves, then take one tablet daily. Patient not taking: Reported on 05/09/2018 02/13/18   Fontaine, Belinda Block, MD    Past Medical History:  Diagnosis Date  . Anxiety   . Bipolar affective psychosis ([place])   . Depression   . GERD (gastroesophageal reflux disease)   . Migraine     Past Surgical History:  Procedure Laterality Date  . CARPAL TUNNEL RELEASE  2009  . CHOLECYSTECTOMY  1997  . GANGLION CYST EXCISION     RIGHT wrist  . KNEE ARTHROSCOPY WITH ANTERIOR CRUCIATE LIGAMENT (ACL) REPAIR Right 10/2015    Social History   Tobacco Use  . Smoking status: Current Every Day Smoker    Packs/day: 0.50    Years: 33.00    Pack years: 16.50    Types: Cigarettes    Start date: 10/10/1981  . Smokeless tobacco: Never Used  Substance Use Topics  . Alcohol use: Yes    Alcohol/week: 0.0 standard drinks    Comment: occasionally    Family History  Problem Relation Age of Onset  . Depression Mother   . Cancer Mother        Cervical  . Alcohol abuse Father   . Crohn's disease Sister   . Stroke Brother 69  . Migraines Daughter   . GER disease Daughter   . Depression Daughter   . Migraines Daughter   . GER disease Daughter     ROS Per hpi  Objective  Vitals as reported by the patient: none  There were no vitals filed for this visit.   ASSESSMENT and PLAN  1. Lower respiratory infection Discussed supportive measures and new medications r/se/b. Discussed cant r/o covid 19. Discussed ER precautions. Discussed self-isolation. Work excuse given.   Other orders - predniSONE (DELTASONE) 20 MG tablet; Take 2 tablets (40 mg total) by mouth daily with breakfast. - azithromycin (ZITHROMAX) 250 MG tablet; Take 2 tablets (500 mg total) by mouth daily for 1 day, THEN 1 tablet (250 mg total) daily for 4 days.  FOLLOW-UP: 1 week  The above assessment and management plan was discussed with the patient. The patient verbalized understanding of and has agreed to the management plan. Patient is aware to call the clinic if symptoms persist or worsen. Patient is aware when to return to the clinic for a follow-up visit. Patient educated on when it is appropriate to go to the emergency department.    I provided 17 minutes of non-face-to-face time during this encounter.  Rutherford Guys, MD Primary Care at [facility] [place], [place] [address] Ph.  [phone] Fax [phone]

## 2018-05-09 NOTE — Progress Notes (Signed)
Chief Complaint : X 3 days  headache,runny nose, wheezing, and sore throat  PHQ9 score 12 GAD score 21  GAD 7 Questionnaire Over the last 2 weeks, how often have you been bothered by the following problems?   Not at all sure = 0   Several days = 1   Over half the days =2   Nearly every day =3  1.  Feeling nervous, anxious, or on edge - 3 2.  Not being able to stop or control worrying -3 3.  Worrying too much about different things -3 4.  Trouble relaxing -3 5.  Being so restless that it's hard to sit still - 3 6.  Becoming easily annoyed or irritable - 7.  Feeling afraid as if something awful might happen - 3 Total Score (add your column scores) =         21  If you checked off any problems, how difficult have these made it for you to do your work, take care of things at home, or get along with other people? - Somewhat difficul

## 2018-05-10 DIAGNOSIS — F250 Schizoaffective disorder, bipolar type: Secondary | ICD-10-CM | POA: Diagnosis not present

## 2018-05-15 ENCOUNTER — Ambulatory Visit: Payer: Self-pay | Admitting: Pulmonary Disease

## 2018-05-17 ENCOUNTER — Telehealth (INDEPENDENT_AMBULATORY_CARE_PROVIDER_SITE_OTHER): Payer: Medicare Other | Admitting: Family Medicine

## 2018-05-17 ENCOUNTER — Other Ambulatory Visit: Payer: Self-pay | Admitting: Women's Health

## 2018-05-17 ENCOUNTER — Other Ambulatory Visit: Payer: Self-pay

## 2018-05-17 DIAGNOSIS — J22 Unspecified acute lower respiratory infection: Secondary | ICD-10-CM | POA: Diagnosis not present

## 2018-05-17 DIAGNOSIS — J439 Emphysema, unspecified: Secondary | ICD-10-CM | POA: Insufficient documentation

## 2018-05-17 DIAGNOSIS — Z720 Tobacco use: Secondary | ICD-10-CM

## 2018-05-17 DIAGNOSIS — Z716 Tobacco abuse counseling: Secondary | ICD-10-CM

## 2018-05-17 DIAGNOSIS — F17299 Nicotine dependence, other tobacco product, with unspecified nicotine-induced disorders: Secondary | ICD-10-CM | POA: Diagnosis not present

## 2018-05-17 MED ORDER — VARENICLINE TARTRATE 0.5 MG X 11 & 1 MG X 42 PO MISC: ORAL | 0 refills | Status: DC

## 2018-05-17 NOTE — Progress Notes (Signed)
1 week follow up from possible COVID19 and pt is feeling better.

## 2018-05-17 NOTE — Progress Notes (Signed)
Virtual Visit Note  I connected with patient on 05/17/18 at 403pm by telephone and verified that I am speaking with the correct person using two identifiers. Lauren Chapman is currently located at home and patient is currently with them during visit. The provider, Rutherford Guys, MD is located in their office at time of visit.  I discussed the limitations, risks, security and privacy concerns of performing an evaluation and management service by telephone and the availability of in person appointments. I also discussed with the patient that there may be a patient responsible charge related to this service. The patient expressed understanding and agreed to proceed.   CC: followup  HPI ? Seen last week for LRTI - tx with azi and prednisone Overall feeling much better  Never had a fever Coughing and SOB significantly improved Has used albuterol only once  Continues smoking, has been trying to quit smoking, not doing well on her own, wellbutrin has not worked, smokes about 10 cig a day, used to smoke a pack a day, has been smoking since she is 48yo She is involved in counseling and psychiatry  She feels she is ready to quit She is ready to go back to work  Allergies  Allergen Reactions  . Codeine Anaphylaxis    Anything with codeine    Prior to Admission medications   Medication Sig Start Date End Date Taking? Authorizing Provider  albuterol (PROVENTIL HFA;VENTOLIN HFA) 108 (90 Base) MCG/ACT inhaler Inhale 2 puffs into the lungs every 4 (four) hours as needed for wheezing or shortness of breath. 01/30/18  Yes Olalere, Adewale A, MD  clonazePAM (KLONOPIN) 0.5 MG tablet Take 0.5 mg by mouth 3 (three) times daily.  12/04/17  Yes [provider]  esomeprazole (NEXIUM) 20 MG capsule Take 20 mg by mouth daily at 12 noon.   Yes [provider]  gabapentin (NEURONTIN) 400 MG capsule Take 800 mg by mouth 3 (three) times daily.  01/13/15  Yes [provider]   lamoTRIgine (LAMICTAL) 150 MG tablet Take 150 mg by mouth daily.   Yes [provider]  mirtazapine (REMERON) 15 MG tablet Take 15 mg by mouth at bedtime.  12/04/17  Yes [provider]  norethindrone (MICRONOR) 0.35 MG tablet TAKE 1 TABLET BY MOUTH DAILY. 05/17/18  Yes Huel Cote, NP  ondansetron (ZOFRAN) 4 MG tablet Take 4 mg by mouth every 8 (eight) hours as needed for nausea or vomiting.   Yes [provider]  rizatriptan (MAXALT) 10 MG tablet Take 1 tab at onset of migraine.  May repeat in 2 hrs, if needed.  Max dose: 2 tabs/day. This is a 30 day prescription. 01/18/18  Yes Marcial Pacas, MD  sertraline (ZOLOFT) 100 MG tablet Take 200 mg by mouth daily.    Yes [provider]  tiZANidine (ZANAFLEX) 4 MG tablet TAKE 1 TABLET BY MOUTH EVERY 6 HOURS AS NEEDED FOR MUSCLE SPASMS. 01/18/18  Yes Marcial Pacas, MD  VRAYLAR capsule Take 3 mg by mouth daily.  04/17/18  Yes [provider]  megestrol (MEGACE) 20 MG tablet Take 1 tablet (20 mg total) by mouth daily. Patient not taking: Reported on 05/17/2018 03/05/18   Fontaine, Belinda Block, MD    Past Medical History:  Diagnosis Date  . Anxiety   . Bipolar affective psychosis ([place])   . Depression   . GERD (gastroesophageal reflux disease)   . Migraine     Past Surgical History:  Procedure Laterality Date  .  CARPAL TUNNEL RELEASE  2009  . CHOLECYSTECTOMY  1997  . GANGLION CYST EXCISION     RIGHT wrist  . KNEE ARTHROSCOPY WITH ANTERIOR CRUCIATE LIGAMENT (ACL) REPAIR Right 10/2015    Social History   Tobacco Use  . Smoking status: Current Every Day Smoker    Packs/day: 0.50    Years: 33.00    Pack years: 16.50    Types: Cigarettes    Start date: 10/10/1981  . Smokeless tobacco: Never Used  Substance Use Topics  . Alcohol use: Yes    Alcohol/week: 0.0 standard drinks    Comment: occasionally    Family History  Problem Relation Age of Onset  . Depression Mother   . Cancer Mother         Cervical  . Alcohol abuse Father   . Crohn's disease Sister   . Stroke Brother 30  . Migraines Daughter   . GER disease Daughter   . Depression Daughter   . Migraines Daughter   . GER disease Daughter     ROS Per hpi  Objective  Vitals as reported by the patient: none   ASSESSMENT and PLAN  1. Lower respiratory infection 2. Encounter for tobacco use cessation counseling 3. Pulmonary emphysema, unspecified emphysema type ([place])  Resolved previous LTRI, most likely NOT covid. She is released to return to work.  Smoking cessation instruction/counseling given for 3-10 minutes:  counseled patient on the dangers of tobacco use, advised patient to stop smoking, and reviewed strategies to maximize success  Starting chantix, reviewed r/se/b  Other orders  - varenicline (CHANTIX PAK) 0.5 MG X 11 & 1 MG X 42 tablet; Take one 0.5 mg tablet by mouth once daily for 3 days, then increase to one 0.5 mg tablet twice daily for 4 days, then increase to one 1 mg tablet twice daily.  FOLLOW-UP: 4 weeks for smoking cessation   The above assessment and management plan was discussed with the patient. The patient verbalized understanding of and has agreed to the management plan. Patient is aware to call the clinic if symptoms persist or worsen. Patient is aware when to return to the clinic for a follow-up visit. Patient educated on when it is appropriate to go to the emergency department.    I provided 15 minutes of non-face-to-face time during this encounter.  Rutherford Guys, MD Primary Care at [facility] [place], [place] [address] Ph.  [phone] Fax [phone]

## 2018-05-29 DIAGNOSIS — F250 Schizoaffective disorder, bipolar type: Secondary | ICD-10-CM | POA: Diagnosis not present

## 2018-05-30 ENCOUNTER — Telehealth: Payer: Self-pay | Admitting: *Deleted

## 2018-05-30 NOTE — Telephone Encounter (Signed)
Calling to schedule AWV

## 2018-06-05 NOTE — Progress Notes (Signed)
Telemedicine visit changed to OV

## 2018-06-06 DIAGNOSIS — F250 Schizoaffective disorder, bipolar type: Secondary | ICD-10-CM | POA: Diagnosis not present

## 2018-06-10 ENCOUNTER — Other Ambulatory Visit: Payer: Self-pay

## 2018-06-11 ENCOUNTER — Ambulatory Visit (INDEPENDENT_AMBULATORY_CARE_PROVIDER_SITE_OTHER): Payer: Medicare Other | Admitting: Gynecology

## 2018-06-11 ENCOUNTER — Encounter: Payer: Self-pay | Admitting: Gynecology

## 2018-06-11 VITALS — BP 118/74 | Ht 63.0 in | Wt 173.0 lb

## 2018-06-11 DIAGNOSIS — N951 Menopausal and female climacteric states: Secondary | ICD-10-CM

## 2018-06-11 DIAGNOSIS — Z9189 Other specified personal risk factors, not elsewhere classified: Secondary | ICD-10-CM

## 2018-06-11 DIAGNOSIS — Z01419 Encounter for gynecological examination (general) (routine) without abnormal findings: Secondary | ICD-10-CM

## 2018-06-11 NOTE — Patient Instructions (Signed)
Call if significant irregular bleeding.  Schedule your colonoscopy with:  [facility]   Address: [address], [place], [place] [address]  [phone]

## 2018-06-11 NOTE — Progress Notes (Signed)
    Lauren Chapman [birth year] [id number]        48 y.o.  [id number] for annual gynecologic exam.  History of irregular bleeding.  Was amenorrheic for several years and then started bleeding heavily.  Evaluation 05/2017 included a negative sonohysterogram with negative endometrial biopsy showing proliferative endometrium.  FSH was 32.  Was initially started on Megace switched to Aygestin but then had continued bleeding and replaced with Megace 20 mg daily.  She has been on this for 3 months with no bleeding.  Past medical history,surgical history, problem list, medications, allergies, family history and social history were all reviewed and documented as reviewed in the EPIC chart.  ROS:  Performed with pertinent positives and negatives included in the history, assessment and plan.   Additional significant findings : None   Exam: Caryn Bee assistant Vitals:   06/11/18 0909  BP: 118/74  Weight: 173 lb (78.5 kg)  Height: 5\' 3"  (1.6 m)   Body mass index is 30.65 kg/m.  General appearance:  Normal affect, orientation and appearance. Skin: Grossly normal HEENT: Without gross lesions.  No cervical or supraclavicular adenopathy. Thyroid normal.  Lungs:  Clear without wheezing, rales or rhonchi Cardiac: RR, without RMG Abdominal:  Soft, nontender, without masses, guarding, rebound, organomegaly or hernia Breasts:  Examined lying and sitting without masses, retractions, discharge or axillary adenopathy. Pelvic:  Ext, BUS, Vagina: Normal  Cervix: Normal  Uterus: Anteverted, normal size, shape and contour, midline and mobile nontender   Adnexa: Without masses or tenderness    Anus and perineum: Normal   Rectovaginal: Normal sphincter tone without palpated masses or tenderness.    Assessment/Plan:  48 y.o. [id number] female for annual gynecologic exam.   1. Perimenopausal with irregular bleeding.  No longer bleeding on Megace.  Recommend stopping Megace end of May and seeing what she does from a  bleeding standpoint.  If irregular bleeding she knows to follow-up with me. 2. Mammography 11/2017.  Continue with annual mammography when due.  Breast exam normal today. 3. Pap smear/HPV 04/2017 by Dr. Pamella Pert was negative for dysplasia.  Did show endometrial cells due to her irregular bleeding at that time which was subsequently evaluated with the negative sonohysterogram and biopsy.  No Pap smear done today.  No history of abnormal Pap smears.  Plan repeat Pap smear/HPV at 5-year interval per current screening guidelines. 4. Colonoscopy never.  Recommended as she has turned 48 that she move towards scheduling a colonoscopy.  Names and numbers provided to schedule through [facility] as she is followed through [facility]. 5. Health maintenance.  No routine lab work done as patient does this elsewhere.  Follow-up 1 year, sooner as needed.   Anastasio Auerbach MD, 9:49 AM 06/11/2018

## 2018-06-12 ENCOUNTER — Ambulatory Visit (HOSPITAL_COMMUNITY)
Admission: EM | Admit: 2018-06-12 | Discharge: 2018-06-12 | Disposition: A | Discharge status: Home / Self Care | Payer: Medicare Other | Attending: Family Medicine | Admitting: Family Medicine

## 2018-06-12 ENCOUNTER — Ambulatory Visit: Payer: Self-pay | Admitting: *Deleted

## 2018-06-12 ENCOUNTER — Other Ambulatory Visit: Payer: Self-pay

## 2018-06-12 ENCOUNTER — Encounter (HOSPITAL_COMMUNITY): Payer: Self-pay

## 2018-06-12 ENCOUNTER — Ambulatory Visit (INDEPENDENT_AMBULATORY_CARE_PROVIDER_SITE_OTHER): Payer: Medicare Other

## 2018-06-12 DIAGNOSIS — R0789 Other chest pain: Secondary | ICD-10-CM | POA: Diagnosis not present

## 2018-06-12 DIAGNOSIS — R0781 Pleurodynia: Secondary | ICD-10-CM | POA: Diagnosis not present

## 2018-06-12 MED ORDER — NAPROXEN 500 MG PO TABS: 500.0000 mg | ORAL_TABLET | Freq: Two times a day (BID) | ORAL | 0 refills | Status: DC

## 2018-06-12 NOTE — ED Triage Notes (Signed)
Patient presents to Urgent Care with complaints of left rib pain since coughing very hard this morning and hearing/feeling a pop. Patient reports she was diagnosed with emphysema in Jan of this year, and has been trying to quit smoking. Pt states she has a continuous dry cough that is worse when she smokes.

## 2018-06-12 NOTE — Discharge Instructions (Signed)
May use Naprosyn twice daily with food May also you continue to use Zanaflex while at home or bedtime Alternate ice and heat I would expect gradual resolution of this pain over the next 2 weeks Follow-up if symptoms worsening, developing increased shortness of breath, fever, cough, persistent pain

## 2018-06-12 NOTE — Telephone Encounter (Signed)
Pt reports coughing today, States "Coughing a lot because I'm trying to quit smoking, cut back but when I do smoke, like today, I cough a lot." States felt "pop" rib area with episode few minutes prior to call. States left rib area, mid, towards back. &/10 pain at rest, "Horrible pain with coughing." States radiates to front rib area. Pt directed to UC/ED. States she will go to UC. Aware they may direct her to ED. Pt verbalizes understanding. Care advise given. States daughter will drive her.   Reason for Disposition . SEVERE chest or rib pain (e.g., excruciating, unable to do any normal activities)  Answer Assessment - Initial Assessment Questions 1. MECHANISM: "How did the injury happen?"     Coughing, felt pop 2. ONSET: "When did the injury happen?" (e.g., minutes, hours, days ago)     Minutes ago 3. LOCATION: "Where on the chest is the injury located?" "Where does it hurt?"     Rib area, left side, mid towards back 4. CHEST OR RIB PAIN SEVERITY: "How bad is the pain?"  (e.g., Scale 1-10; mild, moderate, or severe)    - MILD (1-3): doesn't interfere with normal activities     - MODERATE (4-7): interferes with normal activities or awakens from sleep    - SEVERE (8-10): excruciating pain, unable to do any normal activities       7/10,, 10/10 with coughing 5. BREATHING DIFFICULTY: "Are you having any difficulty breathing?" If so, ask "How bad is it?"  (e.g., none, mild, moderate, severe)  no 6: OTHER SYMPTOMS (e.g., cough, fever, rash)     BAd when I take a deep breath or coough  Protocols used: CHEST INJURY - BENDING, LIFTING, OR TWISTING-A-AH

## 2018-06-13 NOTE — ED Provider Notes (Signed)
[facility]    CSN: [id number] Arrival date & time: 06/12/18  1725     History   Chief Complaint Chief Complaint  Patient presents with   Rib Injury    HPI Lauren Chapman is a 48 y.o. female history of emphysema, GERD, bipolar disorder, presenting today for evaluation of left rib pain.  Patient states that this morning as she was coughing she felt a sharp pop and pain in her left side.  Since she has had pain with breathing and pain with movement.  She states that this is her normal cough which she has had from her emphysema.  Worse in the morning and when she smokes.  She is working on getting on Chantix and quitting smoking.  She is taking the Zanaflex help with the pain which she typically uses as part of a migraine cocktail.  She has felt short of breath since that incident, but more so relates this to pain and difficulty taking a full breath due to pain.  Denies fevers, chills or body aches.  HPI  Past Medical History:  Diagnosis Date   Anxiety    Bipolar affective psychosis ([place])    Depression    Emphysema lung ([place])    GERD (gastroesophageal reflux disease)    Migraine     Patient Active Problem List   Diagnosis Date Noted   Emphysema, unspecified ([place]) 05/17/2018   Weakness 01/25/2017   Chronic migraine 12/22/2014   Encounter for other general counseling or advice on contraception 09/02/2014   Bipolar disorder ([place]) 10/12/2011   Anxiety and depression 10/12/2011   GERD (gastroesophageal reflux disease) 10/12/2011    Past Surgical History:  Procedure Laterality Date   CARPAL TUNNEL RELEASE  2009   CHOLECYSTECTOMY  1997   GANGLION CYST EXCISION     RIGHT wrist   KNEE ARTHROSCOPY WITH ANTERIOR CRUCIATE LIGAMENT (ACL) REPAIR Right 10/2015    OB History    Gravida  3   Para  2   Term  2   Preterm  0   AB  1   Living  2     SAB  1   TAB  0   Ectopic  0   Multiple  0   Live Births               Home  Medications    Prior to Admission medications   Medication Sig Start Date End Date Taking? Authorizing Provider  albuterol (PROVENTIL HFA;VENTOLIN HFA) 108 (90 Base) MCG/ACT inhaler Inhale 2 puffs into the lungs every 4 (four) hours as needed for wheezing or shortness of breath. 01/30/18   Olalere, Cicero Duck A, MD  clonazePAM (KLONOPIN) 0.5 MG tablet Take 0.5 mg by mouth 3 (three) times daily.  12/04/17   [provider]  esomeprazole (NEXIUM) 20 MG capsule Take 20 mg by mouth daily at 12 noon.    [provider]  gabapentin (NEURONTIN) 400 MG capsule Take 800 mg by mouth 3 (three) times daily.  01/13/15   [provider]  lamoTRIgine (LAMICTAL) 150 MG tablet Take 150 mg by mouth daily.    [provider]  mirtazapine (REMERON) 15 MG tablet Take 15 mg by mouth at bedtime.  12/04/17   [provider]  naproxen (NAPROSYN) 500 MG tablet Take 1 tablet (500 mg total) by mouth 2 (two) times daily. 06/12/18   Noelle Hoogland C, PA-C  norethindrone (MICRONOR) 0.35 MG tablet TAKE 1 TABLET BY MOUTH DAILY. 05/17/18  Huel Cote, NP  ondansetron (ZOFRAN) 4 MG tablet Take 4 mg by mouth every 8 (eight) hours as needed for nausea or vomiting.    [provider]  rizatriptan (MAXALT) 10 MG tablet Take 1 tab at onset of migraine.  May repeat in 2 hrs, if needed.  Max dose: 2 tabs/day. This is a 30 day prescription. 01/18/18   Marcial Pacas, MD  sertraline (ZOLOFT) 100 MG tablet Take 200 mg by mouth daily.     [provider]  tiZANidine (ZANAFLEX) 4 MG tablet TAKE 1 TABLET BY MOUTH EVERY 6 HOURS AS NEEDED FOR MUSCLE SPASMS. 01/18/18   Marcial Pacas, MD  varenicline (CHANTIX PAK) 0.5 MG X 11 & 1 MG X 42 tablet Take one 0.5 mg tablet by mouth once daily for 3 days, then increase to one 0.5 mg tablet twice daily for 4 days, then increase to one 1 mg tablet twice daily. Patient not taking: Reported on 06/11/2018 05/17/18   Rutherford Guys, MD  VRAYLAR capsule Take 3  mg by mouth daily.  04/17/18   [provider]    Family History Family History  Problem Relation Age of Onset   Depression Mother    Cancer Mother        Cervical   Alcohol abuse Father    Crohn's disease Sister    Stroke Brother 36   Migraines Daughter    GER disease Daughter    Depression Daughter    Migraines Daughter    GER disease Daughter     Social History Social History   Tobacco Use   Smoking status: Current Every Day Smoker    Packs/day: 0.50    Years: 33.00    Pack years: 16.50    Types: Cigarettes    Start date: 10/10/1981   Smokeless tobacco: Never Used  Substance Use Topics   Alcohol use: Never    Alcohol/week: 0.0 standard drinks    Frequency: Never   Drug use: No     Allergies   Codeine   Review of Systems Review of Systems  Constitutional: Negative for activity change, appetite change, chills, fatigue and fever.  HENT: Negative for congestion, ear pain, rhinorrhea, sinus pressure, sore throat and trouble swallowing.   Eyes: Negative for discharge and redness.  Respiratory: Positive for cough and shortness of breath. Negative for chest tightness.   Cardiovascular: Positive for chest pain.  Gastrointestinal: Negative for abdominal pain, diarrhea, nausea and vomiting.  Musculoskeletal: Negative for myalgias.  Skin: Negative for rash.  Neurological: Negative for dizziness, light-headedness and headaches.     Physical Exam Triage Vital Signs ED Triage Vitals  Enc Vitals Group     BP 06/12/18 1748 (!) 129/51     Pulse Rate 06/12/18 1748 77     Resp 06/12/18 1748 18     Temp 06/12/18 1748 98.6 F (37 C)     Temp Source 06/12/18 1748 Oral     SpO2 06/12/18 1748 100 %     Weight --      Height --      Head Circumference --      Peak Flow --      Pain Score 06/12/18 1746 8     Pain Loc --      Pain Edu? --      Excl. in [place]? --    No data found.  Updated Vital Signs BP (!) 129/51 (BP Location: Left Arm)     Pulse 77  Temp 98.6 F (37 C) (Oral)    Resp 18    LMP 06/07/2014 (Exact Date) Comment: irregular cycles   SpO2 100%   Visual Acuity Right Eye Distance:   Left Eye Distance:   Bilateral Distance:    Right Eye Near:   Left Eye Near:    Bilateral Near:     Physical Exam Vitals signs and nursing note reviewed.  Constitutional:      General: She is not in acute distress.    Appearance: She is well-developed.  HENT:     Head: Normocephalic and atraumatic.  Eyes:     Conjunctiva/sclera: Conjunctivae normal.  Neck:     Musculoskeletal: Neck supple.  Cardiovascular:     Rate and Rhythm: Normal rate and regular rhythm.     Heart sounds: No murmur.  Pulmonary:     Effort: Pulmonary effort is normal. No respiratory distress.     Breath sounds: Normal breath sounds.     Comments: Breathing comfortably at rest, CTABL, no wheezing, rales or other adventitious sounds auscultated Abdominal:     Palpations: Abdomen is soft.     Tenderness: There is no abdominal tenderness.  Musculoskeletal:     Comments: Nontender throughout palpation of left thoracic musculature, tenderness to palpation to mid axillary line and approximately T8-T10.  Skin:    General: Skin is warm and dry.  Neurological:     Mental Status: She is alert.      UC Treatments / Results  Labs (all labs ordered are listed, but only abnormal results are displayed) Labs Reviewed - No data to display  EKG None  Radiology Dg Ribs Unilateral W/chest Left  Result Date: 06/12/2018 CLINICAL DATA:  Left axillary rib pain. EXAM: LEFT RIBS AND CHEST - 3+ VIEW COMPARISON:  Chest x-ray dated 01/01/2018. FINDINGS: No fracture or other bone lesions are seen involving the ribs. There is no evidence of pneumothorax or pleural effusion. Both lungs are clear. Heart size and mediastinal contours are within normal limits. IMPRESSION: Negative. Electronically Signed   By: Constance Holster M.D.   On: 06/12/2018 18:57     Procedures Procedures (including critical care time)  Medications Ordered in UC Medications - No data to display  Initial Impression / Assessment and Plan / UC Course  I have reviewed the triage vital signs and the nursing notes.  Pertinent labs & imaging results that were available during my care of the patient were reviewed by me and considered in my medical decision making (see chart for details).     No fracture noted on x-ray, lungs clear without pneumonia or pneumothorax.  Likely muscular strain.  Will recommend continued anti-inflammatories and muscle relaxers for this.  Would expect gradual self resolution over the next couple weeks.  Provided Naprosyn to continue to use in combination with the Zanaflex she already has prescribed.  Continue to monitor,Discussed strict return precautions. Patient verbalized understanding and is agreeable with plan.  Final Clinical Impressions(s) / UC Diagnoses   Final diagnoses:  Chest wall pain     Discharge Instructions     May use Naprosyn twice daily with food May also you continue to use Zanaflex while at home or bedtime Alternate ice and heat I would expect gradual resolution of this pain over the next 2 weeks Follow-up if symptoms worsening, developing increased shortness of breath, fever, cough, persistent pain   ED Prescriptions    Medication Sig Dispense Auth. Provider   naproxen (NAPROSYN) 500 MG tablet Take 1 tablet (  500 mg total) by mouth 2 (two) times daily. 30 tablet Kora Groom, [place] C, PA-C     Controlled Substance Prescriptions [place] Controlled Substance Registry consulted? Not Applicable   Janith Lima, [place] 06/13/18 1136

## 2018-06-25 DIAGNOSIS — F250 Schizoaffective disorder, bipolar type: Secondary | ICD-10-CM | POA: Diagnosis not present

## 2018-06-25 DIAGNOSIS — F3130 Bipolar disorder, current episode depressed, mild or moderate severity, unspecified: Secondary | ICD-10-CM | POA: Diagnosis not present

## 2018-07-11 ENCOUNTER — Telehealth: Payer: Self-pay | Admitting: Family Medicine

## 2018-07-11 MED ORDER — VARENICLINE TARTRATE 1 MG PO TABS: 1.0000 mg | ORAL_TABLET | Freq: Two times a day (BID) | ORAL | 3 refills | Status: DC

## 2018-07-11 NOTE — Telephone Encounter (Signed)
Copied from [place] [id number]. Topic: General - Call Back - No Documentation >> Jul 11, 2018  9:11 AM Erick Blinks wrote: Reason for CRM: Pt called to inquire about Chantix, and possible alternative resources for continued use. "expansion pack"  [phone] VM okay

## 2018-07-25 NOTE — Telephone Encounter (Signed)
Pt called to follow up on request for expansion for Chantix. Pt is having trouble and craving cigarettes. She has not heard a response since original request on 07/11/18.  [place], [place] - [place] [phone] (Phone) [phone] (Fax)

## 2018-07-29 ENCOUNTER — Encounter (HOSPITAL_COMMUNITY): Payer: Self-pay | Admitting: Emergency Medicine

## 2018-07-29 ENCOUNTER — Ambulatory Visit (HOSPITAL_COMMUNITY)
Admission: EM | Admit: 2018-07-29 | Discharge: 2018-07-29 | Disposition: A | Discharge status: Home / Self Care | Payer: Medicare Other | Attending: Family Medicine | Admitting: Family Medicine

## 2018-07-29 ENCOUNTER — Other Ambulatory Visit: Payer: Self-pay

## 2018-07-29 DIAGNOSIS — M545 Low back pain, unspecified: Secondary | ICD-10-CM

## 2018-07-29 MED ORDER — NAPROXEN 500 MG PO TABS: 500.0000 mg | ORAL_TABLET | Freq: Two times a day (BID) | ORAL | 0 refills | Status: DC

## 2018-07-29 MED ORDER — KETOROLAC TROMETHAMINE 30 MG/ML IJ SOLN
INTRAMUSCULAR | Status: AC
  Filled 2018-07-29: qty 1

## 2018-07-29 MED ORDER — METHOCARBAMOL 500 MG PO TABS: 500.0000 mg | ORAL_TABLET | Freq: Two times a day (BID) | ORAL | 0 refills | Status: DC

## 2018-07-29 MED ORDER — KETOROLAC TROMETHAMINE 30 MG/ML IJ SOLN
30.0000 mg | Freq: Once | INTRAMUSCULAR | Status: AC
  Administered 2018-07-29: 30 mg via INTRAMUSCULAR

## 2018-07-29 NOTE — Discharge Instructions (Addendum)
You were given a Toradol injection here for pain inflammation.  Robaxin sent to the pharmacy for muscle relaxant and naproxen for pain and inflammation twice a day. Take these medications as prescribed. Gentle stretching, heat or massage could help. Follow up as needed for continued or worsening symptoms

## 2018-07-29 NOTE — ED Triage Notes (Signed)
Pt reports bending over on Saturday and having immediate pain in her right lower back.  She denies any urinary issues.  Pt has been taking Zanaflex with no relief.  This has happened to her one other time about 5-6 months ago, in the same way.

## 2018-07-30 NOTE — Telephone Encounter (Signed)
Pt has appointment tomorrow with provider.  

## 2018-07-30 NOTE — ED Provider Notes (Addendum)
[facility]    CSN: [id number] Arrival date & time: 07/29/18  1002     History   Chief Complaint Chief Complaint  Patient presents with  . Back Pain    HPI KENLEA WOODELL is a 48 y.o. female.   Patient is a 48 year old female with past medical history of anxiety, bipolar, depression, emphysema, GERD.  She presents today with approximately 3 days of of right lower back pain.  The pain has been constant.  The pain started after she reached down to pick something up and felt a pull in her back.  She has been taking Zanaflex without much relief.  History of same in the past.  Denies any associated dysuria, hematuria, urinary frequency.  Denies any abdominal pain, nausea, vomiting.  No numbness, tingling or radiation of pain.  No weakness or loss of bowel bladder function.  ROS per HPI      Past Medical History:  Diagnosis Date  . Anxiety   . Bipolar affective psychosis ([place])   . Depression   . Emphysema lung ([place])   . GERD (gastroesophageal reflux disease)   . Migraine     Patient Active Problem List   Diagnosis Date Noted  . Emphysema, unspecified ([place]) 05/17/2018  . Weakness 01/25/2017  . Chronic migraine 12/22/2014  . Encounter for other general counseling or advice on contraception 09/02/2014  . Bipolar disorder ([place]) 10/12/2011  . Anxiety and depression 10/12/2011  . GERD (gastroesophageal reflux disease) 10/12/2011    Past Surgical History:  Procedure Laterality Date  . CARPAL TUNNEL RELEASE  2009  . CHOLECYSTECTOMY  1997  . GANGLION CYST EXCISION     RIGHT wrist  . KNEE ARTHROSCOPY WITH ANTERIOR CRUCIATE LIGAMENT (ACL) REPAIR Right 10/2015    OB History    Gravida  3   Para  2   Term  2   Preterm  0   AB  1   Living  2     SAB  1   TAB  0   Ectopic  0   Multiple  0   Live Births               Home Medications    Prior to Admission medications   Medication Sig Start Date End Date Taking? Authorizing Provider   clonazePAM (KLONOPIN) 0.5 MG tablet Take 0.5 mg by mouth 3 (three) times daily.  12/04/17  Yes [provider]  esomeprazole (NEXIUM) 20 MG capsule Take 20 mg by mouth daily at 12 noon.   Yes [provider]  gabapentin (NEURONTIN) 400 MG capsule Take 800 mg by mouth 3 (three) times daily.  01/13/15  Yes [provider]  lamoTRIgine (LAMICTAL) 150 MG tablet Take 150 mg by mouth daily.   Yes [provider]  mirtazapine (REMERON) 15 MG tablet Take 15 mg by mouth at bedtime.  12/04/17  Yes [provider]  norethindrone (MICRONOR) 0.35 MG tablet TAKE 1 TABLET BY MOUTH DAILY. 05/17/18  Yes Huel Cote, NP  sertraline (ZOLOFT) 100 MG tablet Take 200 mg by mouth daily.    Yes [provider]  tiZANidine (ZANAFLEX) 4 MG tablet TAKE 1 TABLET BY MOUTH EVERY 6 HOURS AS NEEDED FOR MUSCLE SPASMS. 01/18/18  Yes Marcial Pacas, MD  varenicline (CHANTIX) 1 MG tablet Take 1 tablet (1 mg total) by mouth 2 (two) times daily. 07/11/18  Yes Rutherford Guys, MD  VRAYLAR capsule Take 3 mg by mouth daily.  04/17/18  Yes [provider]  albuterol (PROVENTIL HFA;VENTOLIN HFA) 108 (90 Base) MCG/ACT inhaler Inhale 2 puffs into the lungs every 4 (four) hours as needed for wheezing or shortness of breath. 01/30/18   Olalere, Ernesto Rutherford, MD  methocarbamol (ROBAXIN) 500 MG tablet Take 1 tablet (500 mg total) by mouth 2 (two) times daily. 07/29/18   Myleen Brailsford, Tressia Miners A, NP  naproxen (NAPROSYN) 500 MG tablet Take 1 tablet (500 mg total) by mouth 2 (two) times daily. 07/29/18   Issa Luster, Tressia Miners A, NP  ondansetron (ZOFRAN) 4 MG tablet Take 4 mg by mouth every 8 (eight) hours as needed for nausea or vomiting.    [provider]  rizatriptan (MAXALT) 10 MG tablet Take 1 tab at onset of migraine.  May repeat in 2 hrs, if needed.  Max dose: 2 tabs/day. This is a 30 day prescription. 01/18/18   Marcial Pacas, MD    Family History Family History  Problem Relation Age of Onset  .  Depression Mother   . Cancer Mother        Cervical  . Alcohol abuse Father   . Crohn's disease Sister   . Stroke Brother 18  . Migraines Daughter   . GER disease Daughter   . Depression Daughter   . Migraines Daughter   . GER disease Daughter     Social History Social History   Tobacco Use  . Smoking status: Current Every Day Smoker    Packs/day: 0.50    Years: 33.00    Pack years: 16.50    Types: Cigarettes    Start date: 10/10/1981  . Smokeless tobacco: Never Used  Substance Use Topics  . Alcohol use: Never    Alcohol/week: 0.0 standard drinks    Frequency: Never  . Drug use: No     Allergies   Codeine   Review of Systems Review of Systems   Physical Exam Triage Vital Signs ED Triage Vitals  Enc Vitals Group     BP 07/29/18 1030 101/63     Pulse Rate 07/29/18 1030 76     Resp 07/29/18 1030 18     Temp 07/29/18 1030 98 F (36.7 C)     Temp Source 07/29/18 1030 Oral     SpO2 07/29/18 1030 98 %     Weight --      Height --      Head Circumference --      Peak Flow --      Pain Score 07/29/18 1027 8     Pain Loc --      Pain Edu? --      Excl. in [place]? --    No data found.  Updated Vital Signs BP 101/63 (BP Location: Right Arm)   Pulse 76   Temp 98 F (36.7 C) (Oral)   Resp 18   LMP 06/07/2014 (Exact Date) Comment: irregular cycles  SpO2 98%   Visual Acuity Right Eye Distance:   Left Eye Distance:   Bilateral Distance:    Right Eye Near:   Left Eye Near:    Bilateral Near:     Physical Exam Vitals signs and nursing note reviewed.  Constitutional:      Appearance: Normal appearance.  HENT:     Head: Normocephalic and atraumatic.  Eyes:     Conjunctiva/sclera: Conjunctivae normal.  Neck:     Musculoskeletal: Normal range of motion.  Pulmonary:     Effort: Pulmonary effort is normal.  Musculoskeletal:     Comments:  Limited ROM due toe pain. More pain with bending. Mildly tender to palpation of the right lower lumbar paravertebral  musculature.  No bruising, deformities.  No swelling or erythema.  No rash  Skin:    General: Skin is warm and dry.  Neurological:     General: No focal deficit present.     Mental Status: She is alert.     Cranial Nerves: No cranial nerve deficit.     Sensory: No sensory deficit.     Motor: No weakness.  Psychiatric:        Mood and Affect: Mood normal.      UC Treatments / Results  Labs (all labs ordered are listed, but only abnormal results are displayed) Labs Reviewed - No data to display  EKG   Radiology No results found.  Procedures Procedures (including critical care time)  Medications Ordered in UC Medications  ketorolac (TORADOL) 30 MG/ML injection 30 mg (30 mg Intramuscular Given 07/29/18 1101)  ketorolac (TORADOL) 30 MG/ML injection (has no administration in time range)    Initial Impression / Assessment and Plan / UC Course  I have reviewed the triage vital signs and the nursing notes.  Pertinent labs & imaging results that were available during my care of the patient were reviewed by me and considered in my medical decision making (see chart for details).     Symptoms consistent with muscle strain of the right lower lumbar region.  We will treat this with Toradol injection here in clinic. Robaxin sent to the pharmacy for muscle relaxant She can also do naproxen twice a day for pain inflammation Gentle stretching, heat and massage. Follow up as needed for continued or worsening symptoms  Final Clinical Impressions(s) / UC Diagnoses   Final diagnoses:  Acute right-sided low back pain without sciatica     Discharge Instructions     You were given a Toradol injection here for pain inflammation.  Robaxin sent to the pharmacy for muscle relaxant and naproxen for pain and inflammation twice a day. Take these medications as prescribed. Gentle stretching, heat or massage could help. Follow up as needed for continued or worsening symptoms     ED  Prescriptions    Medication Sig Dispense Auth. Provider   methocarbamol (ROBAXIN) 500 MG tablet Take 1 tablet (500 mg total) by mouth 2 (two) times daily. 20 tablet Grisell Bissette A, NP   naproxen (NAPROSYN) 500 MG tablet Take 1 tablet (500 mg total) by mouth 2 (two) times daily. 30 tablet Loura Halt A, NP     Controlled Substance Prescriptions [place] Controlled Substance Registry consulted? Not Applicable       Orvan July, NP 07/30/18 [phone]

## 2018-07-31 ENCOUNTER — Other Ambulatory Visit: Payer: Self-pay

## 2018-07-31 ENCOUNTER — Encounter: Payer: Self-pay | Admitting: Family Medicine

## 2018-07-31 ENCOUNTER — Ambulatory Visit (INDEPENDENT_AMBULATORY_CARE_PROVIDER_SITE_OTHER): Payer: Medicare Other | Admitting: Family Medicine

## 2018-07-31 ENCOUNTER — Ambulatory Visit (INDEPENDENT_AMBULATORY_CARE_PROVIDER_SITE_OTHER): Payer: Medicare Other

## 2018-07-31 VITALS — BP 111/77 | HR 85 | Temp 98.8°F | Resp 16 | Ht 63.0 in | Wt 190.0 lb

## 2018-07-31 DIAGNOSIS — M545 Low back pain, unspecified: Secondary | ICD-10-CM | POA: Insufficient documentation

## 2018-07-31 NOTE — Patient Instructions (Signed)
° ° ° °  If you have lab work done today you will be contacted with your lab results within the next 2 weeks.  If you have not heard from us then please contact us. The fastest way to get your results is to register for My Chart. ° ° °IF you received an x-ray today, you will receive an invoice from [facility]. Please contact [facility] at [phone] with questions or concerns regarding your invoice.  ° °IF you received labwork today, you will receive an invoice from [facility]. Please contact [facility] at [phone] with questions or concerns regarding your invoice.  ° °Our billing staff will not be able to assist you with questions regarding bills from these companies. ° °You will be contacted with the lab results as soon as they are available. The fastest way to get your results is to activate your My Chart account. Instructions are located on the last page of this paperwork. If you have not heard from us regarding the results in 2 weeks, please contact this office. °  ° ° ° °

## 2018-07-31 NOTE — Progress Notes (Signed)
Acute Office Visit  Subjective:    Patient ID: Lauren Chapman, female    DOB: Jun 16, 1970, 48 y.o.   MRN: [id number]   HPI pt states the pain started this Sat and it radiates down her right side. Pt states she was trowing trash aways and soon after the pain started. Pt states the pain level is at a 10. Pt with no h/o MVA in the past-no back injury per pt. Pt states onset when she bent over to throw something away at home-pt received toradol in [facility]-8/10-decreased symptoms after taking. Pt states improved back pain then yesterday when she leaned overat home her back started with spasms again.  Pt has missed three days of work due to pain. Pt states she is concerned about her job. Pt states radiation around the back in belt pattern and now right side primarily with radiation into the right buttocks. Pt states left sides numbness with pressure from laying on the left hip.  Pt with no pain with stair climbing.  Pt with difficulty getting out of chair. Mornings are not an increase in difficulty but as day progresses worse.   Past Medical History:  Diagnosis Date  . Anxiety   . Bipolar affective psychosis ([place])   . Depression   . Emphysema lung ([place])   . GERD (gastroesophageal reflux disease)   . Migraine     Past Surgical History:  Procedure Laterality Date  . CARPAL TUNNEL RELEASE  2009  . CHOLECYSTECTOMY  1997  . GANGLION CYST EXCISION     RIGHT wrist  . KNEE ARTHROSCOPY WITH ANTERIOR CRUCIATE LIGAMENT (ACL) REPAIR Right 10/2015    Family History  Problem Relation Age of Onset  . Depression Mother   . Cancer Mother        Cervical  . Alcohol abuse Father   . Crohn's disease Sister   . Stroke Brother 54  . Migraines Daughter   . GER disease Daughter   . Depression Daughter   . Migraines Daughter   . GER disease Daughter     Social History   Socioeconomic History  . Marital status: Divorced    Spouse name: n/a  . Number of children: 2  . Years of education: 46  . Highest  education level: Not on file  Occupational History  . Occupation: Disabled    Comment: Formerly a Pharmacist, hospital.  Social Needs  . Financial resource strain: Not on file  . Food insecurity    Worry: Not on file    Inability: Not on file  . Transportation needs    Medical: Not on file    Non-medical: Not on file  Tobacco Use  . Smoking status: Current Every Day Smoker    Packs/day: 0.50    Years: 33.00    Pack years: 16.50    Types: Cigarettes    Start date: 10/10/1981  . Smokeless tobacco: Never Used  Substance and Sexual Activity  . Alcohol use: Never    Alcohol/week: 0.0 standard drinks    Frequency: Never  . Drug use: No  . Sexual activity: Not Currently    Partners: Male    Birth control/protection: Post-menopausal    Comment: 1st intercourse 48 yo-More than 5 partners  Lifestyle  . Physical activity    Days per week: Not on file    Minutes per session: Not on file  . Stress: Not on file  Relationships  . Social connections    Talks on phone: Not on file  Gets together: Not on file    Attends religious service: Not on file    Active member of club or organization: Not on file    Attends meetings of clubs or organizations: Not on file    Relationship status: Not on file  . Intimate partner violence    Fear of current or ex partner: Not on file    Emotionally abused: Not on file    Physically abused: Not on file    Forced sexual activity: Not on file  Other Topics Concern  . Not on file  Social History Narrative   Lives at home with 1 of her two daughters   Right-handed.   2-4 cups caffeine daily.   Disabled     Outpatient Medications Prior to Visit  Medication Sig Dispense Refill  . albuterol (PROVENTIL HFA;VENTOLIN HFA) 108 (90 Base) MCG/ACT inhaler Inhale 2 puffs into the lungs every 4 (four) hours as needed for wheezing or shortness of breath. 1 Inhaler 1  . clonazePAM (KLONOPIN) 0.5 MG tablet Take 0.5 mg by mouth 3 (three) times daily.   2  . esomeprazole  (NEXIUM) 20 MG capsule Take 20 mg by mouth daily at 12 noon.    . gabapentin (NEURONTIN) 400 MG capsule Take 800 mg by mouth 3 (three) times daily.   2  . lamoTRIgine (LAMICTAL) 150 MG tablet Take 150 mg by mouth daily.    . methocarbamol (ROBAXIN) 500 MG tablet Take 1 tablet (500 mg total) by mouth 2 (two) times daily. 20 tablet 0  . mirtazapine (REMERON) 15 MG tablet Take 15 mg by mouth at bedtime.   2  . naproxen (NAPROSYN) 500 MG tablet Take 1 tablet (500 mg total) by mouth 2 (two) times daily. 30 tablet 0  . norethindrone (MICRONOR) 0.35 MG tablet TAKE 1 TABLET BY MOUTH DAILY. 84 tablet 0  . ondansetron (ZOFRAN) 4 MG tablet Take 4 mg by mouth every 8 (eight) hours as needed for nausea or vomiting.    . rizatriptan (MAXALT) 10 MG tablet Take 1 tab at onset of migraine.  May repeat in 2 hrs, if needed.  Max dose: 2 tabs/day. This is a 30 day prescription. 15 tablet 3  . sertraline (ZOLOFT) 100 MG tablet Take 200 mg by mouth daily.     Marland Kitchen tiZANidine (ZANAFLEX) 4 MG tablet TAKE 1 TABLET BY MOUTH EVERY 6 HOURS AS NEEDED FOR MUSCLE SPASMS. 30 tablet 2  . varenicline (CHANTIX) 1 MG tablet Take 1 tablet (1 mg total) by mouth 2 (two) times daily. 60 tablet 3  . VRAYLAR capsule Take 3 mg by mouth daily.      No facility-administered medications prior to visit.     Allergies  Allergen Reactions  . Codeine Anaphylaxis    Anything with codeine    Review of Systems  Musculoskeletal: Positive for back pain. Negative for neck pain.  Neurological: Negative for dizziness, tingling, weakness and headaches.       Objective:    Physical Exam  Constitutional: She is oriented to person, place, and time. She appears well-developed and well-nourished. No distress.  Musculoskeletal:        General: Tenderness present.  Neurological: She is alert and oriented to person, place, and time. She displays normal reflexes. Coordination normal.  LROM flex/extend at the waist, LROM rotation   LMP 06/07/2014  (Exact Date) Comment: irregular cycles Wt Readings from Last 3 Encounters:  06/11/18 173 lb (78.5 kg)  04/18/18 162 lb 3.2 oz (73.6  kg)  01/30/18 154 lb 9.6 oz (70.1 kg)    Health Maintenance Due  Topic Date Due  . COLONOSCOPY  04/28/2018    There are no preventive care reminders to display for this patient.   Lab Results  Component Value Date   TSH 0.970 05/15/2017   Lab Results  Component Value Date   WBC 7.4 01/01/2018   HGB 14.0 (A) 01/01/2018   HCT 40.7 01/01/2018   MCV 90.4 01/01/2018   PLT 258 05/15/2017   Lab Results  Component Value Date   NA 142 01/19/2017   K 3.8 01/19/2017   CO2 26 01/19/2017   GLUCOSE 82 01/19/2017   BUN 7 01/19/2017   CREATININE 0.90 01/19/2017   BILITOT 0.3 01/19/2017   ALKPHOS 78 01/19/2017   AST 16 01/19/2017   ALT 14 01/19/2017   PROT 7.5 01/19/2017   ALBUMIN 4.8 01/19/2017   CALCIUM 9.6 01/19/2017   Lab Results  Component Value Date   CHOL 179 10/11/2011   Lab Results  Component Value Date   HDL 50 10/11/2011   Lab Results  Component Value Date   LDLCALC 103 (H) 10/11/2011   Lab Results  Component Value Date   TRIG 130 10/11/2011   Lab Results  Component Value Date   CHOLHDL 3.6 10/11/2011       Assessment & Plan:  1. Acute midline low back pain without sciatica Light duty for 2 weeks with follow up prior to return to full duty.  No lifting, bending or squatting for 2 weeks. Pt out of work Sat , Monday and Tuesday - Ambulatory referral to Physical Therapy - [facility] Lumbar Spine 2-3 Ron Parker, MD  D/w pt results of xray-oseoarthritic change at L5/S1 bilat-no disc narrowing

## 2018-08-14 ENCOUNTER — Ambulatory Visit: Payer: Medicare Other | Attending: Family Medicine | Admitting: Physical Therapy

## 2018-08-14 ENCOUNTER — Encounter: Payer: Self-pay | Admitting: Physical Therapy

## 2018-08-14 ENCOUNTER — Other Ambulatory Visit: Payer: Self-pay

## 2018-08-14 ENCOUNTER — Ambulatory Visit (INDEPENDENT_AMBULATORY_CARE_PROVIDER_SITE_OTHER): Payer: Medicare Other | Admitting: Family Medicine

## 2018-08-14 ENCOUNTER — Telehealth: Payer: Self-pay | Admitting: *Deleted

## 2018-08-14 DIAGNOSIS — M545 Low back pain, unspecified: Secondary | ICD-10-CM

## 2018-08-14 DIAGNOSIS — M62838 Other muscle spasm: Secondary | ICD-10-CM | POA: Diagnosis not present

## 2018-08-14 MED ORDER — METHOCARBAMOL 500 MG PO TABS: 500.0000 mg | ORAL_TABLET | Freq: Two times a day (BID) | ORAL | 0 refills | Status: DC

## 2018-08-14 NOTE — Patient Instructions (Signed)
Pt to have a PT evaluation prior to return to work. 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

## 2018-08-14 NOTE — Progress Notes (Signed)
Acute Office Visit  Subjective:    Patient ID: Lauren Chapman, female    DOB: [birth year], 48 y.o.   MRN: [id number]  Back pain-improving-still having tightness with bending. Pt states tight when bending even with no lifting. Pt states drying toes still tightness.   HPI Patient is in today for back pain-improving but not resolved. Pt was not able to do light duty at [employer] but lifts at check out. Pt did not go to PT-appt made to far from house  Past Medical History:  Diagnosis Date  . Anxiety   . Bipolar affective psychosis ([place])   . Depression   . Emphysema lung ([place])   . GERD (gastroesophageal reflux disease)   . Migraine     Past Surgical History:  Procedure Laterality Date  . CARPAL TUNNEL RELEASE  2009  . CHOLECYSTECTOMY  1997  . GANGLION CYST EXCISION     RIGHT wrist  . KNEE ARTHROSCOPY WITH ANTERIOR CRUCIATE LIGAMENT (ACL) REPAIR Right 10/2015    Family History  Problem Relation Age of Onset  . Depression Mother   . Cancer Mother        Cervical  . Alcohol abuse Father   . Crohn's disease Sister   . Stroke Brother 28  . Migraines Daughter   . GER disease Daughter   . Depression Daughter   . Migraines Daughter   . GER disease Daughter     Social History   Socioeconomic History  . Marital status: Divorced    Spouse name: n/a  . Number of children: 2  . Years of education: 71  . Highest education level: Not on file  Occupational History  . Occupation: Disabled    Comment: Formerly a Pharmacist, hospital.  Social Needs  . Financial resource strain: Not on file  . Food insecurity    Worry: Not on file    Inability: Not on file  . Transportation needs    Medical: Not on file    Non-medical: Not on file  Tobacco Use  . Smoking status: Current Every Day Smoker    Packs/day: 0.50    Years: 33.00    Pack years: 16.50    Types: Cigarettes    Start date: 10/10/1981  . Smokeless tobacco: Never Used  Substance and Sexual Activity  . Alcohol use: Never   Alcohol/week: 0.0 standard drinks    Frequency: Never  . Drug use: No  . Sexual activity: Not Currently    Partners: Male    Birth control/protection: Post-menopausal    Comment: 1st intercourse 48 yo-More than 5 partners  Lifestyle  . Physical activity    Days per week: Not on file    Minutes per session: Not on file  . Stress: Not on file  Relationships  . Social Herbalist on phone: Not on file    Gets together: Not on file    Attends religious service: Not on file    Active member of club or organization: Not on file    Attends meetings of clubs or organizations: Not on file    Relationship status: Not on file  . Intimate partner violence    Fear of current or ex partner: Not on file    Emotionally abused: Not on file    Physically abused: Not on file    Forced sexual activity: Not on file  Other Topics Concern  . Not on file  Social History Narrative   Lives at home with 1 of her  two daughters   Right-handed.   2-4 cups caffeine daily.   Disabled     Outpatient Medications Prior to Visit  Medication Sig Dispense Refill  . albuterol (PROVENTIL HFA;VENTOLIN HFA) 108 (90 Base) MCG/ACT inhaler Inhale 2 puffs into the lungs every 4 (four) hours as needed for wheezing or shortness of breath. 1 Inhaler 1  . clonazePAM (KLONOPIN) 0.5 MG tablet Take 0.5 mg by mouth 3 (three) times daily.   2  . esomeprazole (NEXIUM) 20 MG capsule Take 20 mg by mouth daily at 12 noon.    . gabapentin (NEURONTIN) 800 MG tablet     . lamoTRIgine (LAMICTAL) 150 MG tablet Take 150 mg by mouth daily.    . methocarbamol (ROBAXIN) 500 MG tablet Take 1 tablet (500 mg total) by mouth 2 (two) times daily. 20 tablet 0  . mirtazapine (REMERON) 15 MG tablet Take 15 mg by mouth at bedtime.   2  . naproxen (NAPROSYN) 500 MG tablet Take 1 tablet (500 mg total) by mouth 2 (two) times daily. 30 tablet 0  . norethindrone (MICRONOR) 0.35 MG tablet TAKE 1 TABLET BY MOUTH DAILY. 84 tablet 0  . ondansetron  (ZOFRAN) 4 MG tablet Take 4 mg by mouth every 8 (eight) hours as needed for nausea or vomiting.    . rizatriptan (MAXALT) 10 MG tablet Take 1 tab at onset of migraine.  May repeat in 2 hrs, if needed.  Max dose: 2 tabs/day. This is a 30 day prescription. 15 tablet 3  . sertraline (ZOLOFT) 100 MG tablet Take 200 mg by mouth daily.     Marland Kitchen tiZANidine (ZANAFLEX) 4 MG tablet TAKE 1 TABLET BY MOUTH EVERY 6 HOURS AS NEEDED FOR MUSCLE SPASMS. 30 tablet 2  . varenicline (CHANTIX) 1 MG tablet Take 1 tablet (1 mg total) by mouth 2 (two) times daily. 60 tablet 3  . VRAYLAR capsule Take 3 mg by mouth daily.      No facility-administered medications prior to visit.     Allergies  Allergen Reactions  . Codeine Anaphylaxis    Anything with codeine    Review of Systems  Musculoskeletal: Positive for back pain.       Objective:    Physical Exam  Constitutional: She appears well-developed and well-nourished. No distress.  Musculoskeletal: Normal range of motion.        General: Tenderness present. No deformity.    LMP 06/07/2014 (Exact Date) Comment: irregular cycles Wt Readings from Last 3 Encounters:  07/31/18 190 lb (86.2 kg)  06/11/18 173 lb (78.5 kg)  08/14/18 183 lb 9.6 oz (83.3 kg)    Health Maintenance Due  Topic Date Due  . COLONOSCOPY  04/28/2018     Lab Results  Component Value Date   TSH 0.970 05/15/2017   Lab Results  Component Value Date   WBC 7.4 01/01/2018   HGB 14.0 (A) 01/01/2018   HCT 40.7 01/01/2018   MCV 90.4 01/01/2018   PLT 258 05/15/2017   Lab Results  Component Value Date   NA 142 01/19/2017   K 3.8 01/19/2017   CO2 26 01/19/2017   GLUCOSE 82 01/19/2017   BUN 7 01/19/2017   CREATININE 0.90 01/19/2017   BILITOT 0.3 01/19/2017   ALKPHOS 78 01/19/2017   AST 16 01/19/2017   ALT 14 01/19/2017   PROT 7.5 01/19/2017   ALBUMIN 4.8 01/19/2017   CALCIUM 9.6 01/19/2017   Lab Results  Component Value Date   CHOL 179 10/11/2011   Lab  Results   Component Value Date   HDL 50 10/11/2011   Lab Results  Component Value Date   LDLCALC 103 (H) 10/11/2011   Lab Results  Component Value Date   TRIG 130 10/11/2011   Lab Results  Component Value Date   CHOLHDL 3.6 10/11/2011      Assessment & Plan:  1. Acute midline low back pain without sciatica Pt needs to replicate work skills at PT to make sure pts ROM is safe for RTW.  Pt did not go to PT due to location. Will rescheduled.   - Ambulatory referral to Physical Therapy Tala Eber Hannah Beat, MD

## 2018-08-14 NOTE — Telephone Encounter (Signed)
Stay on Micronor and see what happens this week.  If it continues into next week call and I may switch her back to Megace.

## 2018-08-14 NOTE — Telephone Encounter (Signed)
Patient called c/o bleeding that started yesterday, not heavy, history of irregular bleeding. Patient said she is taking Micronor 0.35 mg daily and has been taking since May. It appears the Micronor was prescribed by Izora Gala in March/2020 visit, was told to call if bleeding should occur again,patient had annual exam with you on 06/11/18.  Please advise

## 2018-08-14 NOTE — Telephone Encounter (Signed)
Patient informed. 

## 2018-08-14 NOTE — Progress Notes (Unsigned)
CC: patient states she is feeling better, when she turns to the left she feels some tightness, she feels like she is ready to go back to work

## 2018-08-15 ENCOUNTER — Encounter: Payer: Self-pay | Admitting: Physical Therapy

## 2018-08-16 ENCOUNTER — Encounter: Payer: Self-pay | Admitting: Physical Therapy

## 2018-08-16 NOTE — Therapy (Signed)
[place], [place], [address] Phone: [phone]   Fax:  [phone]  Physical Therapy Evaluation  Patient Details  Name: Lauren Chapman MRN: [id number] Date of Birth: 1970/08/21 Referring Provider (PT): Benny Lennert NP    Encounter Date: 08/14/2018  PT End of Session - 08/15/18 1440    Visit Number  1    Number of Visits  12    Date for PT Re-Evaluation  09/26/18    PT Start Time  1600    PT Stop Time  1646    PT Time Calculation (min)  46 min    Activity Tolerance  Patient tolerated treatment well    Behavior During Therapy  [facility] for tasks assessed/performed       Past Medical History:  Diagnosis Date  . Anxiety   . Bipolar affective psychosis ([place])   . Depression   . Emphysema lung ([place])   . GERD (gastroesophageal reflux disease)   . Migraine     Past Surgical History:  Procedure Laterality Date  . CARPAL TUNNEL RELEASE  2009  . CHOLECYSTECTOMY  1997  . GANGLION CYST EXCISION     RIGHT wrist  . KNEE ARTHROSCOPY WITH ANTERIOR CRUCIATE LIGAMENT (ACL) REPAIR Right 10/2015    There were no vitals filed for this visit.   Subjective Assessment - 08/16/18 0720    Subjective  Patient is a 48 year old female with an acute onset of lower back pain. She was bending down when she felt a pull on the right side of her back. The pain was a 10/10 and she had to go to urgent care. She now has pain when she is standing at work and when she sits for too long. Her pain is improving but  she cotinues to have spasming.    How long can you sit comfortably?  stiffens when she sits for too long    How long can you stand comfortably?  pain when standing through the whole shift    How long can you walk comfortably?  no limit    Diagnostic tests  X-ray: mild degenerative changes at L5 S1    Patient Stated Goals  to have less pain    Currently in Pain?  Yes    Pain Score  3     Pain Location  Back    Pain  Orientation  Right    Pain Descriptors / Indicators  Aching    Pain Type  Chronic pain    Pain Onset  More than a month ago    Pain Frequency  Constant    Aggravating Factors   turning to the left, bending    Pain Relieving Factors  rest    Multiple Pain Sites  No         [facility] PT Assessment - 08/16/18 0001      Assessment   Medical Diagnosis  Right sided low back pain     Referring Provider (PT)  Benny Lennert NP     Onset Date/Surgical Date  --   3 weeks prior    Hand Dominance  Right    Next MD Visit  nothing scheduled     Prior Therapy  None       Precautions   Precautions  None      Restrictions   Weight Bearing Restrictions  No      Balance Screen   Has the patient fallen in the past 6 months  No    Has the patient had a decrease in activity level because of a fear of falling?   No    Is the patient reluctant to leave their home because of a fear of falling?   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       Cognition   Overall Cognitive Status  Within Functional Limits for tasks assessed    Attention  Focused    Focused Attention  Appears intact    Memory  Appears intact    Awareness  Appears intact    Problem Solving  Appears intact      Observation/Other Assessments   Focus on Therapeutic Outcomes (FOTO)   43% limitation 33% expected      Sensation   Light Touch  Appears Intact      Coordination   Gross Motor Movements are Fluid and Coordinated  Yes    Fine Motor Movements are Fluid and Coordinated  Yes      ROM / Strength   AROM / PROM / Strength  AROM;PROM;Strength      AROM   AROM Assessment Site  Lumbar    Lumbar Flexion  55 degrees with minor pain     Lumbar - Right Side Bend  WNL    Lumbar - Left Side Bend  WNL     Lumbar - Right Rotation  limited  25%     Lumbar - Left Rotation  Pain on the right limited 25%      PROM   Overall PROM Comments  full hip PROM       Strength   Overall Strength Comments  5/5 gross bilateral LE, limited core contraction with cuing       Flexibility   Soft Tissue Assessment /Muscle Length  --   right hamstring more limited then the left      Palpation   Palpation comment  spasming in the right paraspianls and right QL. Spasming into the right gluteal       Special Tests   Other special tests  SLR (-)       Ambulation/Gait   Gait Comments  gait WNL                 Objective measurements completed on examination: See above findings.      [place] Adult PT Treatment/Exercise - 08/16/18 0001      Exercises   Exercises  Lumbar      Lumbar Exercises: Stretches   Lower Trunk Rotation  10 seconds    Lower Trunk Rotation Limitations  in pain free range to improve rotatipon     Piriformis Stretch  2 reps;30 seconds    Piriformis Stretch Limitations  shown in sitting and standing       Lumbar Exercises: Standing   Other Standing Lumbar Exercises  tennis ball trigger point release instruction       Lumbar Exercises: Seated   Other Seated Lumbar Exercises  abdominal breathing x5 with mod cuing              PT Education - 08/15/18 1439    Education Details  HEP, symptom mangement, self trigger poiont release    Person(s) Educated  Patient    Methods  Explanation;Demonstration;Tactile cues;Verbal cues    Comprehension  Verbalized understanding;Returned demonstration;Verbal cues required;Tactile cues required       PT Short Term Goals - 08/16/18 0737      PT SHORT TERM GOAL #1   Title  Patient will demonstrate pain free left rotation    Time  3    Period  Weeks    Status  New    [place] Date  09/06/18      PT SHORT TERM GOAL #2   Title  Patient will demonstrate a good core contration without cueing    Time  3    Period  Weeks    Status  New    Target Date  09/06/18       PT SHORT TERM GOAL #3   Title  Patient will be independent with stretching program    Time  2    Period  Weeks    Status  New    Target Date  08/30/18        PT Long Term Goals - 08/16/18 0739      PT LONG TERM GOAL #1   Title  Patient will be independent with a complete back managment program    Time  6    Period  Weeks    Status  New    Target Date  09/27/18      PT LONG TERM GOAL #2   Title  Patient will stand at work for 6 hours without pain    Time  6    Period  Weeks    Status  New    Target Date  09/27/18      PT LONG TERM GOAL #3   Title  Patient will sit for 2 hours without pain    Time  6    Period  Weeks    Status  New    Target Date  09/27/18             Plan - 08/16/18 0730    Clinical Impression Statement  Patient is a 48 year old female with acute onset of right sided lower back pain 3 weeks ago. She has had this happen 1 other time. X-rays showed mild degeneration at L5-S1. She has spasming of her right lumbar paraspinals and right QLextending into upper gluts. She has limited trunk flexion and rotation to the left. She has normal hip movement and strength. She appears to be moving in the right direction already. Therapy gave her intruction on self trigger point releif and stretching. Therapy will see her for a few more visits for core strengthening and to build her a program to manage her lower back in the future. The patient was seen for a low complexity eval.    Personal Factors and Comorbidities  Comorbidity 1;Comorbidity 2;Comorbidity 3+    Comorbidities  bi-polar, anxiety, depression    Examination-Activity Limitations  Stand;Lift;Squat    Examination-Participation Restrictions  Cleaning    Stability/Clinical Decision Making  Stable/Uncomplicated    Clinical Decision Making  Moderate    Rehab Potential  Excellent    PT Frequency  2x / week    PT Duration  6 weeks    PT Treatment/Interventions  ADLs/Self Care Home  Management;Cryotherapy;Electrical Stimulation;Moist Heat;Traction;Gait training;Stair training;Functional mobility training;Therapeutic exercise;Therapeutic activities;Neuromuscular re-education;Patient/family education;Passive range of motion;Taping    PT Next Visit Plan  soft tissue mobilization to the lumbar spine, consider LAD, begin light core strengthening, consider bridging, hip abduction,  standing core stabilization, at some point she may benefit from 4 way hip stabilization, add hamstring stretch    PT Home Exercise Plan  abdominal breathing, piriformis stretch, tennis ball trigger point release, LTR in pain freee range to improve rotation    Consulted and Agree with Plan of Care  Patient       Patient will benefit from skilled therapeutic intervention in order to improve the following deficits and impairments:  Decreased range of motion, Decreased activity tolerance, Increased fascial restricitons, Postural dysfunction, Pain  Visit Diagnosis: 1. Acute right-sided low back pain without sciatica   2. Other muscle spasm        Problem List Patient Active Problem List   Diagnosis Date Noted  . Emphysema, unspecified ([place]) 05/17/2018  . Weakness 01/25/2017  . Chronic migraine 12/22/2014  . Encounter for other general counseling or advice on contraception 09/02/2014  . Bipolar disorder ([place]) 10/12/2011  . Anxiety and depression 10/12/2011  . GERD (gastroesophageal reflux disease) 10/12/2011    Carney Living PT DPT  08/16/2018, 7:52 AM  [facility] [address] [place], [place], [address] Phone: [phone]   Fax:  [phone]  Name: Lauren Chapman MRN: [id number] Date of Birth: [birth year]

## 2018-08-22 ENCOUNTER — Telehealth: Payer: Self-pay | Admitting: Physical Therapy

## 2018-08-22 ENCOUNTER — Ambulatory Visit: Payer: Medicare Other | Admitting: Physical Therapy

## 2018-08-22 NOTE — Telephone Encounter (Signed)
Spoke with patient regarding  missed visit. Patient reports she had the times mixed up and had called to reschedule. She will be at her appointment on 08/29/2018

## 2018-08-25 ENCOUNTER — Emergency Department (HOSPITAL_COMMUNITY)
Admission: EM | Admit: 2018-08-25 | Discharge: 2018-08-25 | Disposition: A | Discharge status: Home / Self Care | Payer: Medicare Other | Attending: Emergency Medicine | Admitting: Emergency Medicine

## 2018-08-25 ENCOUNTER — Other Ambulatory Visit: Payer: Self-pay

## 2018-08-25 ENCOUNTER — Emergency Department (HOSPITAL_COMMUNITY): Payer: Medicare Other

## 2018-08-25 ENCOUNTER — Encounter (HOSPITAL_COMMUNITY): Payer: Self-pay | Admitting: Emergency Medicine

## 2018-08-25 DIAGNOSIS — R0602 Shortness of breath: Secondary | ICD-10-CM | POA: Diagnosis not present

## 2018-08-25 DIAGNOSIS — J069 Acute upper respiratory infection, unspecified: Secondary | ICD-10-CM | POA: Insufficient documentation

## 2018-08-25 DIAGNOSIS — Z20828 Contact with and (suspected) exposure to other viral communicable diseases: Secondary | ICD-10-CM | POA: Insufficient documentation

## 2018-08-25 DIAGNOSIS — B349 Viral infection, unspecified: Secondary | ICD-10-CM | POA: Diagnosis not present

## 2018-08-25 DIAGNOSIS — Z79899 Other long term (current) drug therapy: Secondary | ICD-10-CM | POA: Diagnosis not present

## 2018-08-25 DIAGNOSIS — F17210 Nicotine dependence, cigarettes, uncomplicated: Secondary | ICD-10-CM | POA: Insufficient documentation

## 2018-08-25 DIAGNOSIS — R05 Cough: Secondary | ICD-10-CM | POA: Diagnosis not present

## 2018-08-25 DIAGNOSIS — R509 Fever, unspecified: Secondary | ICD-10-CM | POA: Diagnosis not present

## 2018-08-25 LAB — CBC WITH DIFFERENTIAL/PLATELET
Abs Immature Granulocytes: 0.03 10*3/uL (ref 0.00–0.07)
Basophils Absolute: 0.1 10*3/uL (ref 0.0–0.1)
Basophils Relative: 1 %
Eosinophils Absolute: 0.3 10*3/uL (ref 0.0–0.5)
Eosinophils Relative: 3 %
HCT: 42.6 % (ref 36.0–46.0)
Hemoglobin: 14.6 g/dL (ref 12.0–15.0)
Immature Granulocytes: 0 %
Lymphocytes Relative: 13 %
Lymphs Abs: 1.4 10*3/uL (ref 0.7–4.0)
MCH: 30.9 pg (ref 26.0–34.0)
MCHC: 34.3 g/dL (ref 30.0–36.0)
MCV: 90.3 fL (ref 80.0–100.0)
Monocytes Absolute: 0.5 10*3/uL (ref 0.1–1.0)
Monocytes Relative: 5 %
Neutro Abs: 8.2 10*3/uL — ABNORMAL HIGH (ref 1.7–7.7)
Neutrophils Relative %: 78 %
Platelets: 224 10*3/uL (ref 150–400)
RBC: 4.72 MIL/uL (ref 3.87–5.11)
RDW: 12.6 % (ref 11.5–15.5)
WBC: 10.4 10*3/uL (ref 4.0–10.5)
nRBC: 0 % (ref 0.0–0.2)

## 2018-08-25 LAB — COMPREHENSIVE METABOLIC PANEL
ALT: 22 U/L (ref 0–44)
AST: 21 U/L (ref 15–41)
Albumin: 4.2 g/dL (ref 3.5–5.0)
Alkaline Phosphatase: 70 U/L (ref 38–126)
Anion gap: 10 (ref 5–15)
BUN: 7 mg/dL (ref 6–20)
CO2: 23 mmol/L (ref 22–32)
Calcium: 9.5 mg/dL (ref 8.9–10.3)
Chloride: 101 mmol/L (ref 98–111)
Creatinine, Ser: 1.18 mg/dL — ABNORMAL HIGH (ref 0.44–1.00)
GFR calc Af Amer: >60 mL/min (ref >60)
GFR calc non Af Amer: 54 mL/min — ABNORMAL LOW (ref >60)
Glucose, Bld: 106 mg/dL — ABNORMAL HIGH (ref 70–99)
Potassium: 4 mmol/L (ref 3.5–5.1)
Sodium: 134 mmol/L — ABNORMAL LOW (ref 135–145)
Total Bilirubin: 0.6 mg/dL (ref 0.3–1.2)
Total Protein: 7.2 g/dL (ref 6.5–8.1)

## 2018-08-25 LAB — I-STAT BETA HCG BLOOD, ED (MC, WL, AP ONLY): I-stat hCG, quantitative: <5 m[IU]/mL (ref <5)

## 2018-08-25 LAB — LACTIC ACID, PLASMA: Lactic Acid, Venous: 1.5 mmol/L (ref 0.5–1.9)

## 2018-08-25 MED ORDER — ACETAMINOPHEN 500 MG PO TABS
1000.0000 mg | ORAL_TABLET | Freq: Once | ORAL | Status: AC
  Administered 2018-08-25: 1000 mg via ORAL
  Filled 2018-08-25: qty 2

## 2018-08-25 MED ORDER — SODIUM CHLORIDE 0.9% FLUSH: 3.0000 mL | Freq: Once | INTRAVENOUS | Status: DC

## 2018-08-25 NOTE — ED Triage Notes (Addendum)
Pt states exposure to people w possible covid. She has had fever, cough, shortness of breath and sore throat with congestion since yesterday, also headaches. Pt requests a "flu test". tachypnic at triage. Took tylenol at 5.

## 2018-08-25 NOTE — ED Provider Notes (Signed)
[facility] EMERGENCY DEPARTMENT Provider Note   CSN: [id number] Arrival date & time: 08/25/18  9233    History   Chief Complaint Chief Complaint  Patient presents with  . Fever  . Shortness of Breath    HPI Lauren Chapman is a 48 y.o. female.     HPI Patient presents from home for evaluation of infectious symptoms.  Reports that her daughter's boyfriend has had symptoms starting last week.  Is concerned it was coronavirus and got tested but has not been self isolating from her daughter who subsequently developed Past Medical History:  Diagnosis Date  . Anxiety   . Bipolar affective psychosis ([place])   . Depression   . Emphysema lung ([place])   . GERD (gastroesophageal reflux disease)   . Migraine     Patient Active Problem List   Diagnosis Date Noted  . Emphysema, unspecified ([place]) 05/17/2018  . Weakness 01/25/2017  . Chronic migraine 12/22/2014  . Encounter for other general counseling or advice on contraception 09/02/2014  . Bipolar disorder ([place]) 10/12/2011  . Anxiety and depression 10/12/2011  . GERD (gastroesophageal reflux disease) 10/12/2011    Past Surgical History:  Procedure Laterality Date  . CARPAL TUNNEL RELEASE  2009  . CHOLECYSTECTOMY  1997  . GANGLION CYST EXCISION     RIGHT wrist  . KNEE ARTHROSCOPY WITH ANTERIOR CRUCIATE LIGAMENT (ACL) REPAIR Right 10/2015     OB History    Gravida  3   Para  2   Term  2   Preterm  0   AB  1   Living  2     SAB  1   TAB  0   Ectopic  0   Multiple  0   Live Births               Home Medications    Prior to Admission medications   Medication Sig Start Date End Date Taking? Authorizing Provider  albuterol (PROVENTIL HFA;VENTOLIN HFA) 108 (90 Base) MCG/ACT inhaler Inhale 2 puffs into the lungs every 4 (four) hours as needed for wheezing or shortness of breath. 01/30/18   Olalere, Cicero Duck A, MD  clonazePAM (KLONOPIN) 0.5 MG tablet Take 0.5 mg by mouth 3 (three) times  daily.  12/04/17   [provider]  esomeprazole (NEXIUM) 20 MG capsule Take 20 mg by mouth daily at 12 noon.    [provider]  gabapentin (NEURONTIN) 800 MG tablet  07/02/18   [provider]  lamoTRIgine (LAMICTAL) 150 MG tablet Take 150 mg by mouth daily.    [provider]  methocarbamol (ROBAXIN) 500 MG tablet Take 1 tablet (500 mg total) by mouth 2 (two) times daily. 08/14/18   Maryruth Hancock, MD  mirtazapine (REMERON) 15 MG tablet Take 15 mg by mouth at bedtime.  12/04/17   [provider]  naproxen (NAPROSYN) 500 MG tablet Take 1 tablet (500 mg total) by mouth 2 (two) times daily. 07/29/18   Bast, Tressia Miners A, NP  norethindrone (MICRONOR) 0.35 MG tablet TAKE 1 TABLET BY MOUTH DAILY. 05/17/18   Huel Cote, NP  ondansetron (ZOFRAN) 4 MG tablet Take 4 mg by mouth every 8 (eight) hours as needed for nausea or vomiting.    [provider]  rizatriptan (MAXALT) 10 MG tablet Take 1 tab at onset of migraine.  May repeat in 2 hrs, if needed.  Max dose: 2 tabs/day. This is a 30 day prescription. 01/18/18   Krista Blue,  Yijun, MD  sertraline (ZOLOFT) 100 MG tablet Take 200 mg by mouth daily.     [provider]  tiZANidine (ZANAFLEX) 4 MG tablet TAKE 1 TABLET BY MOUTH EVERY 6 HOURS AS NEEDED FOR MUSCLE SPASMS. 01/18/18   Marcial Pacas, MD  varenicline (CHANTIX) 1 MG tablet Take 1 tablet (1 mg total) by mouth 2 (two) times daily. 07/11/18   Rutherford Guys, MD  VRAYLAR capsule Take 3 mg by mouth daily.  04/17/18   [provider]    Family History Family History  Problem Relation Age of Onset  . Depression Mother   . Cancer Mother        Cervical  . Alcohol abuse Father   . Crohn's disease Sister   . Stroke Brother 63  . Migraines Daughter   . GER disease Daughter   . Depression Daughter   . Migraines Daughter   . GER disease Daughter     Social History Social History   Tobacco Use  . Smoking status: Current Every Day Smoker     Packs/day: 0.50    Years: 33.00    Pack years: 16.50    Types: Cigarettes    Start date: 10/10/1981  . Smokeless tobacco: Never Used  Substance Use Topics  . Alcohol use: Never    Alcohol/week: 0.0 standard drinks    Frequency: Never  . Drug use: No     Allergies   Codeine   Review of Systems Review of Systems  Constitutional: Positive for chills, fatigue and fever.  HENT: Positive for sore throat.   Respiratory: Positive for cough and shortness of breath.   Musculoskeletal: Positive for myalgias.  All other systems reviewed and are negative.    Physical Exam Updated Vital Signs BP 97/76   Pulse 86   Temp 99.2 F (37.3 C) (Oral)   Resp 18   Ht 5\' 2"  (1.575 m)   Wt 72.6 kg   LMP 06/07/2014 (Exact Date) Comment: irregular cycles  SpO2 96%   BMI 29.26 kg/m   Physical Exam Vitals signs and nursing note reviewed.  Constitutional:      General: She is not in acute distress.    Appearance: She is well-developed. She is ill-appearing.  HENT:     Head: Normocephalic and atraumatic.  Eyes:     Conjunctiva/sclera: Conjunctivae normal.  Neck:     Musculoskeletal: Neck supple.  Cardiovascular:     Rate and Rhythm: Normal rate and regular rhythm.     Heart sounds: No murmur.  Pulmonary:     Effort: Pulmonary effort is normal. No respiratory distress.     Comments: Coarse bilateral breath sounds without clear rhonchi Abdominal:     Palpations: Abdomen is soft.     Tenderness: There is no abdominal tenderness.  Skin:    General: Skin is warm and dry.  Neurological:     Mental Status: She is alert.      ED Treatments / Results  Labs (all labs ordered are listed, but only abnormal results are displayed) Labs Reviewed  COMPREHENSIVE METABOLIC PANEL - Abnormal; Notable for the following components:      Result Value   Sodium 134 (*)    Glucose, Bld 106 (*)    Creatinine, Ser 1.18 (*)    GFR calc non Af Amer 54 (*)    All other components within normal limits   CBC WITH DIFFERENTIAL/PLATELET - Abnormal; Notable for the following components:   Neutro Abs 8.2 (*)  All other components within normal limits  NOVEL CORONAVIRUS, NAA (HOSPITAL ORDER, SEND-OUT TO REF LAB)  LACTIC ACID, PLASMA  LACTIC ACID, PLASMA  I-STAT BETA HCG BLOOD, ED ([facility], [facility], [facility] ONLY)    EKG None  Radiology Dg Chest Portable 1 View  Result Date: 08/25/2018 CLINICAL DATA:  Cough, fever, and shortness of breath EXAM: PORTABLE CHEST 1 VIEW COMPARISON:  Jun 12, 2018 FINDINGS: Lungs are clear. Heart size and pulmonary vascularity are normal. No adenopathy. No bone lesions. No pneumothorax. IMPRESSION: No edema or consolidation.  No adenopathy. Electronically Signed   By: Lowella Grip III M.D.   On: 08/25/2018 21:22    Procedures Procedures (including critical care time)  Medications Ordered in ED Medications  sodium chloride flush (NS) 0.9 % injection 3 mL (has no administration in time range)  acetaminophen (TYLENOL) tablet 1,000 mg (1,000 mg Oral Given 08/25/18 2120)     Initial Impression / Assessment and Plan / ED Course  I have reviewed the triage vital signs and the nursing notes.  Pertinent labs & imaging results that were available during my care of the patient were reviewed by me and considered in my medical decision making (see chart for details).        Ms. Welker is a 48 year old female with a history of anxiety depression, bipolar disorder, emphysema, and GERD.  She presents today with signs and symptoms consistent with viral syndrome.  Appears upper respiratory as her main complaint is cough with mild shortness of breath.  Cough is nonproductive.  She does not have chest pain or other signs to suggest PE.  Given the constellation of fevers, chills, malaise, myalgias, I think that infection is most common but she does not have evidence of pneumonia on her chest x-ray.  Highly suspicious for coronavirus.  Have tested for it and this is pending.  Otherwise  labs are reassuring.  She is not pregnant and does not have any significant anemia, leukocytosis, or electrolyte abnormality.  Overall appropriate for discharge and outpatient follow-up as needed.  Patient vocalized understanding and agreement with plan. Hand no other questions or concerns. Was given relevant verbal and written information regarding aftercare and return precautions and discharged in good condition.    Final Clinical Impressions(s) / ED Diagnoses   Final diagnoses:  Acute viral syndrome  Viral upper respiratory tract infection    ED Discharge Orders    None       Tillie Fantasia, MD 08/25/18 2137    Sherwood Gambler, MD 08/25/18 2340

## 2018-08-27 LAB — NOVEL CORONAVIRUS, NAA (HOSP ORDER, SEND-OUT TO REF LAB; TAT 18-24 HRS): SARS-CoV-2, NAA: NOT DETECTED

## 2018-08-28 ENCOUNTER — Other Ambulatory Visit: Payer: Self-pay

## 2018-08-28 ENCOUNTER — Telehealth (INDEPENDENT_AMBULATORY_CARE_PROVIDER_SITE_OTHER): Payer: Medicare Other | Admitting: Family Medicine

## 2018-08-28 DIAGNOSIS — J438 Other emphysema: Secondary | ICD-10-CM

## 2018-08-28 DIAGNOSIS — R0600 Dyspnea, unspecified: Secondary | ICD-10-CM | POA: Diagnosis not present

## 2018-08-28 DIAGNOSIS — Z1211 Encounter for screening for malignant neoplasm of colon: Secondary | ICD-10-CM | POA: Diagnosis not present

## 2018-08-28 DIAGNOSIS — R062 Wheezing: Secondary | ICD-10-CM | POA: Diagnosis not present

## 2018-08-28 DIAGNOSIS — R05 Cough: Secondary | ICD-10-CM | POA: Diagnosis not present

## 2018-08-28 DIAGNOSIS — R5383 Other fatigue: Secondary | ICD-10-CM

## 2018-08-28 DIAGNOSIS — R059 Cough, unspecified: Secondary | ICD-10-CM

## 2018-08-28 MED ORDER — PREDNISONE 20 MG PO TABS: 40.0000 mg | ORAL_TABLET | Freq: Every day | ORAL | 0 refills | Status: DC

## 2018-08-28 NOTE — Progress Notes (Signed)
Pt is wanting to talk about the negative result she received on her covid testing. She is still having symptoms of coughing, fatigue and stuffiness. She is asking for an antibiotic and steroid. She also has emphysema which is causing wheezing which can be heard clearly while talking to pt. She is needing reassurance that the test result is 100% correct. Pt was given the pharyngeal swab technique. Medication and pharmacy verified. She also approved the cologuard and will be taking the flu shot when she comes for her next ov.

## 2018-08-28 NOTE — Patient Instructions (Signed)
Although it is less likely that you have COVID-19 with negative test, that test was done very early in your symptoms and there is a possibility of false negative testing early.  As symptoms are somewhat improved today but still requiring albuterol frequently, I will treat with a few days of prednisone for possible COPD/emphysema flare.  Recheck in 2 days and can decide on return to work at that time as well as any potential need for repeat testing.  If any increase shortness of breath, or other worsening symptoms in the next 48 hours, proceed to the emergency room.  Thanks for taking a video call today and take care.

## 2018-08-28 NOTE — Progress Notes (Signed)
Virtual Visit via Video Note  I connected with Lauren Chapman on 08/28/18 at 4:02 PM by a video enabled telemedicine application and verified that I am speaking with the correct person using two identifiers.   I discussed the limitations, risks, security and privacy concerns of performing an evaluation and management service by telephone and the availability of in person appointments. I also discussed with the patient that there may be a patient responsible charge related to this service. The patient expressed understanding and agreed to proceed, consent obtained  Chief complaint: COUGH, FATIGUE.   History of Present Illness: Lauren Chapman is a 48 y.o. female  ER note reviewed.  Cough, dyspnea, fatigue  Seen at [facility], ER on August 2.  1 view chest x-ray at that time without edema or consolidation, no adenopathy.  Normal lactate, WBC 10.4.  Thought to have viral syndrome, and was suspicious for COVID-19, however had negative COVID-19 test resulted yesterday from 8/2. O2 sat 96%.   4 days ago woke up with sore throat. Next day (3 days ago) had increased cough, wheeze, fever (100.5 max, treated with tylenol), shortness of breath and wheezing.  No fever in past 2 days.  Still dyspnea, wheezing. Dry cough, with wheezing. Still some chest congestion/tightness.  albuterol inhaler every 2 hours - min relief, but feels like better - more energy, able to talk easier today - less dyspnea. Coughing fits, then dyspneic. No phlegm/productive cough.   DTR's boyfriend had initial sx;s last week, but he had negative covid test as well - he was symptomatic longer.  Thought to be cold.   History of COPD/emphysema.  Has albuterol but no daily medication. Last flare requiring prednisone few months ago - zpak, prednisone 40mg  qd for 5 days.   Negative COVID-19 test obtained on August 2.  Patient Active Problem List   Diagnosis Date Noted  . Emphysema, unspecified ([place]) 05/17/2018  . Weakness  01/25/2017  . Chronic migraine 12/22/2014  . Encounter for other general counseling or advice on contraception 09/02/2014  . Bipolar disorder ([place]) 10/12/2011  . Anxiety and depression 10/12/2011  . GERD (gastroesophageal reflux disease) 10/12/2011   Past Medical History:  Diagnosis Date  . Anxiety   . Bipolar affective psychosis ([place])   . Depression   . Emphysema lung ([place])   . GERD (gastroesophageal reflux disease)   . Migraine    Past Surgical History:  Procedure Laterality Date  . CARPAL TUNNEL RELEASE  2009  . CHOLECYSTECTOMY  1997  . GANGLION CYST EXCISION     RIGHT wrist  . KNEE ARTHROSCOPY WITH ANTERIOR CRUCIATE LIGAMENT (ACL) REPAIR Right 10/2015   Allergies  Allergen Reactions  . Codeine Anaphylaxis    Anything with codeine   Prior to Admission medications   Medication Sig Start Date End Date Taking? Authorizing Provider  albuterol (PROVENTIL HFA;VENTOLIN HFA) 108 (90 Base) MCG/ACT inhaler Inhale 2 puffs into the lungs every 4 (four) hours as needed for wheezing or shortness of breath. 01/30/18   Olalere, Cicero Duck A, MD  clonazePAM (KLONOPIN) 0.5 MG tablet Take 0.5 mg by mouth 3 (three) times daily.  12/04/17   [provider]  esomeprazole (NEXIUM) 20 MG capsule Take 20 mg by mouth daily at 12 noon.    [provider]  gabapentin (NEURONTIN) 800 MG tablet  07/02/18   [provider]  lamoTRIgine (LAMICTAL) 150 MG tablet Take 150 mg by mouth daily.    [provider]  methocarbamol (ROBAXIN) 500 MG  tablet Take 1 tablet (500 mg total) by mouth 2 (two) times daily. 08/14/18   Maryruth Hancock, MD  mirtazapine (REMERON) 15 MG tablet Take 15 mg by mouth at bedtime.  12/04/17   [provider]  naproxen (NAPROSYN) 500 MG tablet Take 1 tablet (500 mg total) by mouth 2 (two) times daily. 07/29/18   Bast, Tressia Miners A, NP  norethindrone (MICRONOR) 0.35 MG tablet TAKE 1 TABLET BY MOUTH DAILY. 05/17/18   Huel Cote, NP  ondansetron (ZOFRAN) 4 MG  tablet Take 4 mg by mouth every 8 (eight) hours as needed for nausea or vomiting.    [provider]  rizatriptan (MAXALT) 10 MG tablet Take 1 tab at onset of migraine.  May repeat in 2 hrs, if needed.  Max dose: 2 tabs/day. This is a 30 day prescription. 01/18/18   Marcial Pacas, MD  sertraline (ZOLOFT) 100 MG tablet Take 200 mg by mouth daily.     [provider]  tiZANidine (ZANAFLEX) 4 MG tablet TAKE 1 TABLET BY MOUTH EVERY 6 HOURS AS NEEDED FOR MUSCLE SPASMS. 01/18/18   Marcial Pacas, MD  varenicline (CHANTIX) 1 MG tablet Take 1 tablet (1 mg total) by mouth 2 (two) times daily. 07/11/18   Rutherford Guys, MD  VRAYLAR capsule Take 3 mg by mouth daily.  04/17/18   [provider]   Social History   Socioeconomic History  . Marital status: Divorced    Spouse name: n/a  . Number of children: 2  . Years of education: 17  . Highest education level: Not on file  Occupational History  . Occupation: Disabled    Comment: Formerly a Pharmacist, hospital.  Social Needs  . Financial resource strain: Not on file  . Food insecurity    Worry: Not on file    Inability: Not on file  . Transportation needs    Medical: Not on file    Non-medical: Not on file  Tobacco Use  . Smoking status: Current Every Day Smoker    Packs/day: 0.50    Years: 33.00    Pack years: 16.50    Types: Cigarettes    Start date: 10/10/1981  . Smokeless tobacco: Never Used  Substance and Sexual Activity  . Alcohol use: Never    Alcohol/week: 0.0 standard drinks    Frequency: Never  . Drug use: No  . Sexual activity: Not Currently    Partners: Male    Birth control/protection: Post-menopausal    Comment: 1st intercourse 48 yo-More than 5 partners  Lifestyle  . Physical activity    Days per week: Not on file    Minutes per session: Not on file  . Stress: Not on file  Relationships  . Social Herbalist on phone: Not on file    Gets together: Not on file    Attends religious service: Not on  file    Active member of club or organization: Not on file    Attends meetings of clubs or organizations: Not on file    Relationship status: Not on file  . Intimate partner violence    Fear of current or ex partner: Not on file    Emotionally abused: Not on file    Physically abused: Not on file    Forced sexual activity: Not on file  Other Topics Concern  . Not on file  Social History Narrative   Lives at home with 1 of her two daughters   Right-handed.  2-4 cups caffeine daily.   Disabled     Observations/Objective: Speaking in full sentences on my evaluation today.  Did note some wheezing from staff triage earlier today.  Appropriate responses, nontoxic appearance.  Assessment and Plan:   Screening for colon cancer - Plan: Cologuard, Other emphysema ([place]) - Plan:  Cough - Plan:  Wheezing - Plan:  Dyspnea, unspecified type - Plan:  Fatigue, unspecified type - Plan:     Cough, wheezing as above.  Possible emphysema flare, initial negative COVID testing but early in the course of illness.  -Prednisone 40 mg daily x3 days initially, then reassess.  Consider repeat testing for [place].  RTC/ER precautions.  cologuard ordered.   Follow Up Instructions:    Patient Instructions  Although it is less likely that you have COVID-19 with negative test, that test was done very early in your symptoms and there is a possibility of false negative testing early.  As symptoms are somewhat improved today but still requiring albuterol frequently, I will treat with a few days of prednisone for possible COPD/emphysema flare.  Recheck in 2 days and can decide on return to work at that time as well as any potential need for repeat testing.  If any increase shortness of breath, or other worsening symptoms in the next 48 hours, proceed to the emergency room.  Thanks for taking a video call today and take care.   I discussed the assessment and treatment plan with the patient. The patient was  provided an opportunity to ask questions and all were answered. The patient agreed with the plan and demonstrated an understanding of the instructions.   The patient was advised to call back or seek an in-person evaluation if the symptoms worsen or if the condition fails to improve as anticipated.  I provided 18 minutes of non-face-to-face time during this encounter.   Wendie Agreste, MD

## 2018-08-29 ENCOUNTER — Other Ambulatory Visit: Payer: Self-pay | Admitting: Pulmonary Disease

## 2018-08-29 ENCOUNTER — Ambulatory Visit: Payer: Medicare Other | Attending: Family Medicine | Admitting: Physical Therapy

## 2018-08-29 ENCOUNTER — Encounter: Payer: Self-pay | Admitting: Physical Therapy

## 2018-08-29 ENCOUNTER — Other Ambulatory Visit: Payer: Self-pay

## 2018-08-29 DIAGNOSIS — M545 Low back pain, unspecified: Secondary | ICD-10-CM

## 2018-08-29 DIAGNOSIS — M62838 Other muscle spasm: Secondary | ICD-10-CM | POA: Insufficient documentation

## 2018-08-30 ENCOUNTER — Encounter: Payer: Self-pay | Admitting: Physical Therapy

## 2018-08-30 ENCOUNTER — Telehealth (INDEPENDENT_AMBULATORY_CARE_PROVIDER_SITE_OTHER): Payer: Medicare Other | Admitting: Family Medicine

## 2018-08-30 ENCOUNTER — Telehealth: Payer: Self-pay | Admitting: Family Medicine

## 2018-08-30 DIAGNOSIS — R05 Cough: Secondary | ICD-10-CM | POA: Diagnosis not present

## 2018-08-30 DIAGNOSIS — R059 Cough, unspecified: Secondary | ICD-10-CM

## 2018-08-30 DIAGNOSIS — J439 Emphysema, unspecified: Secondary | ICD-10-CM | POA: Diagnosis not present

## 2018-08-30 MED ORDER — PREDNISONE 20 MG PO TABS: 40.0000 mg | ORAL_TABLET | Freq: Every day | ORAL | 0 refills | Status: DC

## 2018-08-30 NOTE — Patient Instructions (Addendum)
  Good to hear that your symptoms are improving.  I will extend prednisone for 2 more days.  Continue albuterol if needed but if increased use or increased need for albuterol again, follow-up to discuss other plan.  Emergency room if needed.  Let me know if you would like me to order another COVID test but if one is scheduled for next Friday I think that will be fine as I still recommend not returning to work until 10 days have passed from onset of symptoms.  Please let me know if there are questions and take care.   Return to the clinic or go to the nearest emergency room if any of your symptoms worsen or new symptoms occur.    If you have lab work done today you will be contacted with your lab results within the next 2 weeks.  If you have not heard from [place] then please contact us. The fastest way to get your results is to register for My Chart.   IF you received an x-ray today, you will receive an invoice from [facility]. Please contact [facility] at [phone] with questions or concerns regarding your invoice.   IF you received labwork today, you will receive an invoice from [facility]. Please contact [facility] at [phone] with questions or concerns regarding your invoice.   Our billing staff will not be able to assist you with questions regarding bills from these companies.  You will be contacted with the lab results as soon as they are available. The fastest way to get your results is to activate your My Chart account. Instructions are located on the last page of this paperwork. If you have not heard from [place] regarding the results in 2 weeks, please contact this office.

## 2018-08-30 NOTE — Progress Notes (Signed)
Virtual Visit via Video Note  I connected with Lauren Chapman on 08/30/18 at 8:22 AM by a video enabled telemedicine application and verified that I am speaking with the correct person using two identifiers.   I discussed the limitations, risks, security and privacy concerns of performing an evaluation and management service by telephone and the availability of in person appointments. I also discussed with the patient that there may be a patient responsible charge related to this service. The patient expressed understanding and agreed to proceed, consent obtained  Chief complaint:  Cough, dyspnea.    History of Present Illness: Lauren Chapman is a 48 y.o. female  Follow up cough/fatigue/dyspena. Ist day of symptoms 8/1.  See last ov.  Treated with prednisone for possible COPD/emphysema flare, as she was requiring albuterol frequently at that time.  Did have initial COVID-19 testing but early in the course of her illness.  Feels better, less chest congestion. Albuterol has been more effective, and only needing twice per day.  No fever.  Still some congestion.  Not fatigued, able to do normal activities.  Declines repeat Covid testing. Plans to self isolate until planned return to work after 10 days (plan for rtw 8/12)  Scheduled for routine covid test next Friday in plan for repeat pft's     Patient Active Problem List   Diagnosis Date Noted  . Emphysema, unspecified ([place]) 05/17/2018  . Weakness 01/25/2017  . Chronic migraine 12/22/2014  . Encounter for other general counseling or advice on contraception 09/02/2014  . Bipolar disorder ([place]) 10/12/2011  . Anxiety and depression 10/12/2011  . GERD (gastroesophageal reflux disease) 10/12/2011   Past Medical History:  Diagnosis Date  . Anxiety   . Bipolar affective psychosis ([place])   . Depression   . Emphysema lung ([place])   . GERD (gastroesophageal reflux disease)   . Migraine    Past Surgical History:  Procedure Laterality  Date  . CARPAL TUNNEL RELEASE  2009  . CHOLECYSTECTOMY  1997  . GANGLION CYST EXCISION     RIGHT wrist  . KNEE ARTHROSCOPY WITH ANTERIOR CRUCIATE LIGAMENT (ACL) REPAIR Right 10/2015   Allergies  Allergen Reactions  . Codeine Anaphylaxis    Anything with codeine   Prior to Admission medications   Medication Sig Start Date End Date Taking? Authorizing Provider  albuterol (PROVENTIL HFA;VENTOLIN HFA) 108 (90 Base) MCG/ACT inhaler Inhale 2 puffs into the lungs every 4 (four) hours as needed for wheezing or shortness of breath. 01/30/18  Yes Olalere, Adewale A, MD  clonazePAM (KLONOPIN) 0.5 MG tablet Take 0.5 mg by mouth 3 (three) times daily.  12/04/17  Yes [provider]  esomeprazole (NEXIUM) 20 MG capsule Take 20 mg by mouth daily at 12 noon.   Yes [provider]  gabapentin (NEURONTIN) 800 MG tablet  07/02/18  Yes [provider]  lamoTRIgine (LAMICTAL) 150 MG tablet Take 150 mg by mouth daily.   Yes [provider]  methocarbamol (ROBAXIN) 500 MG tablet Take 1 tablet (500 mg total) by mouth 2 (two) times daily. 08/14/18  Yes Corum, Rex Kras, MD  mirtazapine (REMERON) 15 MG tablet Take 15 mg by mouth at bedtime.  12/04/17  Yes [provider]  naproxen (NAPROSYN) 500 MG tablet Take 1 tablet (500 mg total) by mouth 2 (two) times daily. 07/29/18  Yes Bast, Traci A, NP  norethindrone (MICRONOR) 0.35 MG tablet TAKE 1 TABLET BY MOUTH DAILY. 05/17/18  Yes Huel Cote, NP  ondansetron [place])  4 MG tablet Take 4 mg by mouth every 8 (eight) hours as needed for nausea or vomiting.   Yes [provider]  predniSONE (DELTASONE) 20 MG tablet Take 2 tablets (40 mg total) by mouth daily with breakfast. 08/28/18  Yes Wendie Agreste, MD  rizatriptan (MAXALT) 10 MG tablet Take 1 tab at onset of migraine.  May repeat in 2 hrs, if needed.  Max dose: 2 tabs/day. This is a 30 day prescription. 01/18/18  Yes Marcial Pacas, MD  sertraline (ZOLOFT) 100 MG tablet Take  200 mg by mouth daily.    Yes [provider]  tiZANidine (ZANAFLEX) 4 MG tablet TAKE 1 TABLET BY MOUTH EVERY 6 HOURS AS NEEDED FOR MUSCLE SPASMS. 01/18/18  Yes Marcial Pacas, MD  varenicline (CHANTIX) 1 MG tablet Take 1 tablet (1 mg total) by mouth 2 (two) times daily. 07/11/18  Yes Rutherford Guys, MD  VRAYLAR capsule Take 3 mg by mouth daily.  04/17/18  Yes [provider]   Social History   Socioeconomic History  . Marital status: Divorced    Spouse name: n/a  . Number of children: 2  . Years of education: 45  . Highest education level: Not on file  Occupational History  . Occupation: Disabled    Comment: Formerly a Pharmacist, hospital.  Social Needs  . Financial resource strain: Not on file  . Food insecurity    Worry: Not on file    Inability: Not on file  . Transportation needs    Medical: Not on file    Non-medical: Not on file  Tobacco Use  . Smoking status: Current Every Day Smoker    Packs/day: 0.50    Years: 33.00    Pack years: 16.50    Types: Cigarettes    Start date: 10/10/1981  . Smokeless tobacco: Never Used  Substance and Sexual Activity  . Alcohol use: Never    Alcohol/week: 0.0 standard drinks    Frequency: Never  . Drug use: No  . Sexual activity: Not Currently    Partners: Male    Birth control/protection: Post-menopausal    Comment: 1st intercourse 48 yo-More than 5 partners  Lifestyle  . Physical activity    Days per week: Not on file    Minutes per session: Not on file  . Stress: Not on file  Relationships  . Social Herbalist on phone: Not on file    Gets together: Not on file    Attends religious service: Not on file    Active member of club or organization: Not on file    Attends meetings of clubs or organizations: Not on file    Relationship status: Not on file  . Intimate partner violence    Fear of current or ex partner: Not on file    Emotionally abused: Not on file    Physically abused: Not on file    Forced sexual  activity: Not on file  Other Topics Concern  . Not on file  Social History Narrative   Lives at home with 1 of her two daughters   Right-handed.   2-4 cups caffeine daily.   Disabled     Observations/Objective: Speaking full sentences, no distress, single cough during video visit.  Appropriate responses and all questions answered.  Assessment and Plan: Cough - Plan:  Acute exacerbation of emphysema ([place]) - Plan:   -Suspect initial viral illness, less likely COVID with initial negative testing, but potential for early false negative  results were discussed.  Significant improvement with prednisone past 2 days, will extend today for total 5 days of 40 mg daily.  Continue albuterol as needed, other symptomatic care with RTC/ER precautions given  -Discussed repeat covid testing, has one scheduled anyway next Friday, plans to self isolate until 10 days have passed from onset of symptoms. declined other testing sooner.  Return to work planned on 8/12.    Follow Up Instructions:   prn.   Patient Instructions    Good to hear that your symptoms are improving.  I will extend prednisone for 2 more days.  Continue albuterol if needed but if increased use or increased need for albuterol again, follow-up to discuss other plan.  Emergency room if needed.  Let me know if you would like me to order another COVID test but if one is scheduled for next Friday I think that will be fine as I still recommend not returning to work until 10 days have passed from onset of symptoms.  Please let me know if there are questions and take care.   Return to the clinic or go to the nearest emergency room if any of your symptoms worsen or new symptoms occur.    If you have lab work done today you will be contacted with your lab results within the next 2 weeks.  If you have not heard from [place] then please contact us. The fastest way to get your results is to register for My Chart.   IF you received an x-ray today, you  will receive an invoice from [facility]. Please contact [facility] at [phone] with questions or concerns regarding your invoice.   IF you received labwork today, you will receive an invoice from [place]. Please contact [facility] at [phone] with questions or concerns regarding your invoice.   Our billing staff will not be able to assist you with questions regarding bills from these companies.  You will be contacted with the lab results as soon as they are available. The fastest way to get your results is to activate your My Chart account. Instructions are located on the last page of this paperwork. If you have not heard from [place] regarding the results in 2 weeks, please contact this office.        I discussed the assessment and treatment plan with the patient. The patient was provided an opportunity to ask questions and all were answered. The patient agreed with the plan and demonstrated an understanding of the instructions.   The patient was advised to call back or seek an in-person evaluation if the symptoms worsen or if the condition fails to improve as anticipated.  I provided 8 minutes of non-face-to-face time during this encounter.   Wendie Agreste, MD

## 2018-08-30 NOTE — Telephone Encounter (Signed)
Pt's daughter dropped off FMLA paperwork. Left in provider's box at nurses station. Pt would like a CB when completed for pick up.

## 2018-08-30 NOTE — Progress Notes (Signed)
CC- f/u Covid- Patient had a Covid test sun 08/25/18 and was neg. Patient states Dr Carlota Raspberry thinks she still have covid and wanted me to f/u after taking prednisone. Patient said  She is still congested, cough, and headache and little sob. But over all doing much better.

## 2018-08-30 NOTE — Therapy (Signed)
[place], [place], [address] Phone: [phone]   Fax:  [phone]  Physical Therapy Treatment  Patient Details  Name: Lauren Chapman MRN: [id number] Date of Birth: 1970/12/25 Referring Provider (PT): Benny Lennert MD   Encounter Date: 08/29/2018  PT End of Session - 08/29/18 1553    Visit Number  2    Number of Visits  12    Date for PT Re-Evaluation  09/26/18    PT Start Time  1545    PT Stop Time  1625    PT Time Calculation (min)  40 min    Activity Tolerance  Patient tolerated treatment well    Behavior During Therapy  [facility] for tasks assessed/performed       Past Medical History:  Diagnosis Date  . Anxiety   . Bipolar affective psychosis ([place])   . Depression   . Emphysema lung ([place])   . GERD (gastroesophageal reflux disease)   . Migraine     Past Surgical History:  Procedure Laterality Date  . CARPAL TUNNEL RELEASE  2009  . CHOLECYSTECTOMY  1997  . GANGLION CYST EXCISION     RIGHT wrist  . KNEE ARTHROSCOPY WITH ANTERIOR CRUCIATE LIGAMENT (ACL) REPAIR Right 10/2015    There were no vitals filed for this visit.  Subjective Assessment - 08/29/18 1549    Subjective  Patient has been doing her exericses and feels like her back is improving. She is still out of work but it is more because of her breathing. She has been working on her exercises and feels like her back is doing well.    How long can you sit comfortably?  stiffens when she sits for too long    How long can you stand comfortably?  pain when standing through the whole shift    How long can you walk comfortably?  no limit    Diagnostic tests  X-ray: mild degenerative changes at L5 S1    Patient Stated Goals  to have less pain    Currently in Pain?  No/denies    Pain Onset  More than a month ago    Multiple Pain Sites  No                       [facility] Adult PT Treatment/Exercise - 08/30/18 0001      Self-Care    Self-Care  Other Self-Care Comments    Other Self-Care Comments   reviewed lifting posture, to keep load close to body and how to avoid twisting.       Lumbar Exercises: Stretches   Lower Trunk Rotation  10 seconds    Lower Trunk Rotation Limitations  in pain free range to improve rotatipon     Piriformis Stretch  2 reps;30 seconds    Piriformis Stretch Limitations  shown in sitting and standing       Lumbar Exercises: Supine   AB Set Limitations  reviewed abdominal breathing x5 advised with her respiratory  issues to be careful not to get herself out of breath     Clam Limitations  supine hip abduction green 2x10     Bent Knee Raise Limitations  2x10 reviewed how to make exercise harder     Bridge Limitations  x10 reviewed how to make exercise harder     Straight Leg Raises Limitations  x5 each leg              PT  Education - 08/29/18 1553    Education Details  reviewed HEP and symptom mangement    Person(s) Educated  Patient    Methods  Explanation;Demonstration;Tactile cues;Verbal cues    Comprehension  Verbalized understanding;Returned demonstration;Verbal cues required;Tactile cues required       PT Short Term Goals - 08/30/18 1108      PT SHORT TERM GOAL #1   Title  Patient will demonstrate pain free left rotation    Baseline  no pain    Time  3    Period  Weeks    Status  Achieved      PT SHORT TERM GOAL #2   Title  Patient will demonstrate a good core contration without cueing    Baseline  good core contraction    Time  3    Period  Weeks    Status  Achieved    Target Date  09/06/18      PT SHORT TERM GOAL #3   Title  Patient will be independent with stretching program    Baseline  idepednent with basic program    Time  2    Period  Weeks    Status  Achieved        PT Long Term Goals - 08/16/18 0739      PT LONG TERM GOAL #1   Title  Patient will be independent with a complete back managment program    Time  6    Period  Weeks    Status  New     Target Date  09/27/18      PT LONG TERM GOAL #2   Title  Patient will stand at work for 6 hours without pain    Time  6    Period  Weeks    Status  New    Target Date  09/27/18      PT LONG TERM GOAL #3   Title  Patient will sit for 2 hours without pain    Time  6    Period  Weeks    Status  New    Target Date  09/27/18            Plan - 08/30/18 1105    Clinical Impression Statement  Patient is making great progress with therapy. She is no longer having pain. Therapy reviewed core exercises and lifting technique. She is out of work at this time 2nd to respiratory issues. She will return when those clear. She was advised until that time to keep working on her stretching and strengthening    Personal Factors and Comorbidities  Comorbidity 1;Comorbidity 2;Comorbidity 3+    Comorbidities  bi-polar, anxiety, depression    Examination-Activity Limitations  Stand;Lift;Squat    Examination-Participation Restrictions  Cleaning    Stability/Clinical Decision Making  Stable/Uncomplicated    Clinical Decision Making  Moderate    Rehab Potential  Excellent    PT Frequency  2x / week    PT Duration  6 weeks    PT Treatment/Interventions  ADLs/Self Care Home Management;Cryotherapy;Electrical Stimulation;Moist Heat;Traction;Gait training;Stair training;Functional mobility training;Therapeutic exercise;Therapeutic activities;Neuromuscular re-education;Patient/family education;Passive range of motion;Taping    PT Next Visit Plan  soft tissue mobilization to the lumbar spine, consider LAD, begin light core strengthening, consider bridging, hip abduction, standing core stabilization, at some point she may benefit from 4 way hip stabilization, add hamstring stretch    PT Home Exercise Plan  abdominal breathing, piriformis stretch, tennis ball trigger point release, LTR in pain freee range  to improve rotation    Consulted and Agree with Plan of Care  Patient       Patient will benefit from  skilled therapeutic intervention in order to improve the following deficits and impairments:  Decreased range of motion, Decreased activity tolerance, Increased fascial restricitons, Postural dysfunction, Pain  Visit Diagnosis: 1. Acute right-sided low back pain without sciatica   2. Other muscle spasm        Problem List Patient Active Problem List   Diagnosis Date Noted  . Emphysema, unspecified ([place]) 05/17/2018  . Weakness 01/25/2017  . Chronic migraine 12/22/2014  . Encounter for other general counseling or advice on contraception 09/02/2014  . Bipolar disorder ([place]) 10/12/2011  . Anxiety and depression 10/12/2011  . GERD (gastroesophageal reflux disease) 10/12/2011    Carney Living PT DPT  08/30/2018, 11:12 AM  [facility] [address] [place], [place], [address] Phone: [phone]   Fax:  [phone]  Name: Lauren Chapman MRN: [id number] Date of Birth: [birth year]

## 2018-09-02 ENCOUNTER — Telehealth: Payer: Self-pay | Admitting: *Deleted

## 2018-09-02 ENCOUNTER — Other Ambulatory Visit: Payer: Self-pay

## 2018-09-02 ENCOUNTER — Telehealth: Payer: Self-pay | Admitting: Family Medicine

## 2018-09-02 NOTE — Telephone Encounter (Signed)
Patient called and left message c/o left pelvic pain and back pain, asked what to do. I called and received patient voicemail. I left detailed message to call and schedule OV.

## 2018-09-02 NOTE — Telephone Encounter (Signed)
Pt is needing a note to return to work from Dr. Carlota Raspberry full range of motion visit 08/28/2018 and 08/30/2018/, returning to work the 12th   FR please call pt when note is ready .Marland Kitchen FR

## 2018-09-03 ENCOUNTER — Encounter: Payer: Self-pay | Admitting: Gynecology

## 2018-09-03 ENCOUNTER — Ambulatory Visit (INDEPENDENT_AMBULATORY_CARE_PROVIDER_SITE_OTHER): Payer: Medicare Other | Admitting: Gynecology

## 2018-09-03 VITALS — BP 122/76

## 2018-09-03 DIAGNOSIS — R1032 Left lower quadrant pain: Secondary | ICD-10-CM | POA: Diagnosis not present

## 2018-09-03 DIAGNOSIS — N926 Irregular menstruation, unspecified: Secondary | ICD-10-CM

## 2018-09-03 MED ORDER — IBUPROFEN 800 MG PO TABS: 800.0000 mg | ORAL_TABLET | Freq: Three times a day (TID) | ORAL | 1 refills | Status: DC | PRN

## 2018-09-03 MED ORDER — MEGESTROL ACETATE 20 MG PO TABS: 20.0000 mg | ORAL_TABLET | Freq: Every day | ORAL | 3 refills | Status: DC

## 2018-09-03 NOTE — Patient Instructions (Signed)
Follow-up for the ultrasound as scheduled. 

## 2018-09-03 NOTE — Progress Notes (Signed)
    Lauren Chapman Jun 25, 1970 [id number]        48 y.o.  [id number] presents with the relatively acute onset of left lower quadrant pain yesterday.  Described as sharp stabbing to aching with cramping and radiation into her back.  No history of same before.  Has been having some irregular spotting on and off taking Micronor per Izora Gala.  History of amenorrhea for proximately 3 years and then started bleeding last year.  Evaluation showed a negative sonohysterogram, negative endometrial biopsy with proliferative endometrium and a borderline FSH of 32.  Initially was treated with Megace then switched to Aygestin and ultimately Micronor.  Has had breakthrough bleeding on this.  Past medical history,surgical history, problem list, medications, allergies, family history and social history were all reviewed and documented in the EPIC chart.  Directed ROS with pertinent positives and negatives documented in the history of present illness/assessment and plan.  Exam: Caryn Bee assistant Vitals:   09/03/18 1109  BP: 122/76   General appearance:  Normal Spine straight without CVA tenderness Abdomen soft nontender without masses guarding rebound Pelvic external BUS vagina normal.  Cervix normal.  Uterus grossly normal midline mobile nontender.  Adnexa without gross masses or tenderness.  Assessment/Plan:  48 y.o. [id number] with relatively acute onset left lower quadrant pain.  Exam is unremarkable.  Urinalysis is negative.  Continues with irregular bleeding on Micronor.  Discussed differential to include GYN versus non-GYN.  She is not having other symptoms such as nausea vomiting diarrhea constipation.  No urinary frequency dysuria urgency fever or chills.  We will start with GYN ultrasound, backup sonohysterogram if needed based on endometrial assessment on ultrasound and rule out ovarian cyst.  Start back on Megace 20 mg daily to control bleeding.  Check baseline FSH and CBC.    Anastasio Auerbach MD,  11:31 AM 09/03/2018

## 2018-09-04 LAB — CBC WITH DIFFERENTIAL/PLATELET
Absolute Monocytes: 373 cells/uL (ref 200–950)
Basophils Absolute: 57 cells/uL (ref 0–200)
Basophils Relative: 0.5 %
Eosinophils Absolute: 203 cells/uL (ref 15–500)
Eosinophils Relative: 1.8 %
HCT: 42.2 % (ref 35.0–45.0)
Hemoglobin: 14.6 g/dL (ref 11.7–15.5)
Lymphs Abs: 1413 cells/uL (ref 850–3900)
MCH: 30.9 pg (ref 27.0–33.0)
MCHC: 34.6 g/dL (ref 32.0–36.0)
MCV: 89.4 fL (ref 80.0–100.0)
MPV: 10.3 fL (ref 7.5–12.5)
Monocytes Relative: 3.3 %
Neutro Abs: 9255 cells/uL — ABNORMAL HIGH (ref 1500–7800)
Neutrophils Relative %: 81.9 %
Platelets: 285 10*3/uL (ref 140–400)
RBC: 4.72 10*6/uL (ref 3.80–5.10)
RDW: 12.7 % (ref 11.0–15.0)
Total Lymphocyte: 12.5 %
WBC: 11.3 10*3/uL — ABNORMAL HIGH (ref 3.8–10.8)

## 2018-09-04 LAB — FOLLICLE STIMULATING HORMONE: FSH: 20.6 m[IU]/mL

## 2018-09-04 NOTE — Telephone Encounter (Signed)
Call pt re RTW letter from [place].  Pt states she also needs RTW letter for [facility] and never received a RTW letter from [place].  Reviewed last OV re back pain.  MD wanted to see PT notes prior to RTW.  Advised pt will leave RTW note from Dr. Carlota Raspberry at front for pick up.  Will look into FMLA paperwork as MD will need to review PT notes.  Paperwork is in provider's cubby and is currently in progress.

## 2018-09-05 DIAGNOSIS — F250 Schizoaffective disorder, bipolar type: Secondary | ICD-10-CM | POA: Diagnosis not present

## 2018-09-05 LAB — URINALYSIS, COMPLETE W/RFL CULTURE
Bacteria, UA: NONE SEEN /HPF
Bilirubin Urine: NEGATIVE
Glucose, UA: NEGATIVE
Hgb urine dipstick: NEGATIVE
Hyaline Cast: NONE SEEN /LPF
Ketones, ur: NEGATIVE
Leukocyte Esterase: NEGATIVE
Nitrites, Initial: NEGATIVE
Protein, ur: NEGATIVE
RBC / HPF: NONE SEEN /HPF (ref 0–2)
Specific Gravity, Urine: 1.025 (ref 1.001–1.03)
WBC, UA: NONE SEEN /HPF (ref 0–5)
pH: 5.5 (ref 5.0–8.0)

## 2018-09-05 LAB — NO CULTURE INDICATED

## 2018-09-06 ENCOUNTER — Other Ambulatory Visit (HOSPITAL_COMMUNITY)
Admission: RE | Admit: 2018-09-06 | Discharge: 2018-09-06 | Disposition: A | Discharge status: Home / Self Care | Payer: Medicare Other | Source: Ambulatory Visit | Attending: Pulmonary Disease | Admitting: Pulmonary Disease

## 2018-09-06 DIAGNOSIS — Z20828 Contact with and (suspected) exposure to other viral communicable diseases: Secondary | ICD-10-CM | POA: Insufficient documentation

## 2018-09-06 DIAGNOSIS — Z01812 Encounter for preprocedural laboratory examination: Secondary | ICD-10-CM | POA: Diagnosis not present

## 2018-09-06 LAB — SARS CORONAVIRUS 2 (TAT 6-24 HRS): SARS Coronavirus 2: NEGATIVE

## 2018-09-09 ENCOUNTER — Ambulatory Visit (INDEPENDENT_AMBULATORY_CARE_PROVIDER_SITE_OTHER): Payer: Medicare Other | Admitting: Pulmonary Disease

## 2018-09-09 ENCOUNTER — Encounter: Payer: Self-pay | Admitting: Pulmonary Disease

## 2018-09-09 ENCOUNTER — Other Ambulatory Visit: Payer: Self-pay

## 2018-09-09 VITALS — BP 118/72 | HR 85 | Ht 64.0 in | Wt 192.0 lb

## 2018-09-09 DIAGNOSIS — R05 Cough: Secondary | ICD-10-CM

## 2018-09-09 DIAGNOSIS — J438 Other emphysema: Secondary | ICD-10-CM | POA: Diagnosis not present

## 2018-09-09 DIAGNOSIS — R0602 Shortness of breath: Secondary | ICD-10-CM

## 2018-09-09 DIAGNOSIS — R635 Abnormal weight gain: Secondary | ICD-10-CM | POA: Diagnosis not present

## 2018-09-09 DIAGNOSIS — R059 Cough, unspecified: Secondary | ICD-10-CM

## 2018-09-09 LAB — PULMONARY FUNCTION TEST
DL/VA % pred: 102 %
DL/VA: 4.4 ml/min/mmHg/L
DLCO cor % pred: 88 %
DLCO cor: 18.63 ml/min/mmHg
DLCO unc % pred: 91 %
DLCO unc: 19.28 ml/min/mmHg
FEF 25-75 Post: 2.75 L/sec
FEF 25-75 Pre: 2.32 L/sec
FEF2575-%Change-Post: 18 %
FEF2575-%Pred-Post: 99 %
FEF2575-%Pred-Pre: 84 %
FEV1-%Change-Post: 5 %
FEV1-%Pred-Post: 89 %
FEV1-%Pred-Pre: 84 %
FEV1-Post: 2.5 L
FEV1-Pre: 2.36 L
FEV1FVC-%Change-Post: 1 %
FEV1FVC-%Pred-Pre: 101 %
FEV6-%Change-Post: 4 %
FEV6-%Pred-Post: 88 %
FEV6-%Pred-Pre: 84 %
FEV6-Post: 3.04 L
FEV6-Pre: 2.91 L
FEV6FVC-%Pred-Post: 102 %
FEV6FVC-%Pred-Pre: 102 %
FVC-%Change-Post: 4 %
FVC-%Pred-Post: 85 %
FVC-%Pred-Pre: 82 %
FVC-Post: 3.04 L
FVC-Pre: 2.91 L
Post FEV1/FVC ratio: 82 %
Post FEV6/FVC ratio: 100 %
Pre FEV1/FVC ratio: 81 %
Pre FEV6/FVC Ratio: 100 %
RV % pred: 130 %
RV: 2.34 L
TLC % pred: 102 %
TLC: 5.18 L

## 2018-09-09 NOTE — Progress Notes (Signed)
Lauren Chapman    [id number]    Jun 21, 1970  Primary Care Physician:Santiago, Lilia Argue, MD  Referring Physician: Rutherford Guys, MD [address] [place],  [place] [address]  Chief complaint:   History of cough, a little bit better  HPI:  Cough is better Active smoker Has no history of asthma, no history of obstructive lung disease Concern was for emphysema with large lung volumes on chest x-ray  Quit smoking for about 2 years in the past, resume smoking related to stress  Denies any chest pains or chest discomfort Denies any hemoptysis No weight loss No fevers or chills Persistent cough with shortness of breath and wheezing  Recent chest x-ray reviewed  She does have a history of bipolar disorder Chronic migraines GERD  Of concern today is that her weight did increase by almost 30 pounds compared to the last time she was here  Outpatient Encounter Medications as of 09/09/2018  Medication Sig  . albuterol (PROVENTIL HFA;VENTOLIN HFA) 108 (90 Base) MCG/ACT inhaler Inhale 2 puffs into the lungs every 4 (four) hours as needed for wheezing or shortness of breath.  . clonazePAM (KLONOPIN) 0.5 MG tablet Take 0.5 mg by mouth 3 (three) times daily.   Marland Kitchen esomeprazole (NEXIUM) 20 MG capsule Take 20 mg by mouth daily at 12 noon.  . gabapentin (NEURONTIN) 800 MG tablet   . ibuprofen (ADVIL) 800 MG tablet Take 1 tablet (800 mg total) by mouth every 8 (eight) hours as needed.  . lamoTRIgine (LAMICTAL) 150 MG tablet Take 150 mg by mouth daily.  . megestrol (MEGACE) 20 MG tablet Take 1 tablet (20 mg total) by mouth daily.  . methocarbamol (ROBAXIN) 500 MG tablet Take 1 tablet (500 mg total) by mouth 2 (two) times daily.  . mirtazapine (REMERON) 15 MG tablet Take 15 mg by mouth at bedtime.   . naproxen (NAPROSYN) 500 MG tablet Take 1 tablet (500 mg total) by mouth 2 (two) times daily.  . ondansetron (ZOFRAN) 4 MG tablet Take 4 mg by mouth every 8 (eight) hours as needed for nausea  or vomiting.  . rizatriptan (MAXALT) 10 MG tablet Take 1 tab at onset of migraine.  May repeat in 2 hrs, if needed.  Max dose: 2 tabs/day. This is a 30 day prescription.  . sertraline (ZOLOFT) 100 MG tablet Take 200 mg by mouth daily.   Marland Kitchen tiZANidine (ZANAFLEX) 4 MG tablet TAKE 1 TABLET BY MOUTH EVERY 6 HOURS AS NEEDED FOR MUSCLE SPASMS.  Marland Kitchen varenicline (CHANTIX) 1 MG tablet Take 1 tablet (1 mg total) by mouth 2 (two) times daily.  Marland Kitchen VRAYLAR capsule Take 3 mg by mouth daily.   . [DISCONTINUED] norethindrone (MICRONOR) 0.35 MG tablet TAKE 1 TABLET BY MOUTH DAILY.  . [DISCONTINUED] predniSONE (DELTASONE) 20 MG tablet Take 2 tablets (40 mg total) by mouth daily with breakfast.  . [DISCONTINUED] predniSONE (DELTASONE) 20 MG tablet Take 2 tablets (40 mg total) by mouth daily with breakfast.   No facility-administered encounter medications on file as of 09/09/2018.     Allergies as of 09/09/2018 - Review Complete 09/09/2018  Allergen Reaction Noted  . Codeine Anaphylaxis 10/11/2011    Past Medical History:  Diagnosis Date  . Anxiety   . Bipolar affective psychosis ([place])   . Depression   . Emphysema lung ([place])   . GERD (gastroesophageal reflux disease)   . Migraine     Past Surgical History:  Procedure Laterality Date  .  CARPAL TUNNEL RELEASE  2009  . CHOLECYSTECTOMY  1997  . GANGLION CYST EXCISION     RIGHT wrist  . KNEE ARTHROSCOPY WITH ANTERIOR CRUCIATE LIGAMENT (ACL) REPAIR Right 10/2015    Family History  Problem Relation Age of Onset  . Depression Mother   . Cancer Mother        Cervical  . Alcohol abuse Father   . Crohn's disease Sister   . Stroke Brother 36  . Migraines Daughter   . GER disease Daughter   . Depression Daughter   . Migraines Daughter   . GER disease Daughter     Social History   Socioeconomic History  . Marital status: Divorced    Spouse name: n/a  . Number of children: 2  . Years of education: 37  . Highest education level: Not on file   Occupational History  . Occupation: Disabled    Comment: Formerly a Pharmacist, hospital.  Social Needs  . Financial resource strain: Not on file  . Food insecurity    Worry: Not on file    Inability: Not on file  . Transportation needs    Medical: Not on file    Non-medical: Not on file  Tobacco Use  . Smoking status: Former Smoker    Packs/day: 0.50    Years: 33.00    Pack years: 16.50    Types: Cigarettes    Start date: 10/10/1981  . Smokeless tobacco: Never Used  Substance and Sexual Activity  . Alcohol use: Never    Alcohol/week: 0.0 standard drinks    Frequency: Never  . Drug use: No  . Sexual activity: Not Currently    Partners: Male    Birth control/protection: Post-menopausal    Comment: 1st intercourse 48 yo-More than 5 partners  Lifestyle  . Physical activity    Days per week: Not on file    Minutes per session: Not on file  . Stress: Not on file  Relationships  . Social Herbalist on phone: Not on file    Gets together: Not on file    Attends religious service: Not on file    Active member of club or organization: Not on file    Attends meetings of clubs or organizations: Not on file    Relationship status: Not on file  . Intimate partner violence    Fear of current or ex partner: Not on file    Emotionally abused: Not on file    Physically abused: Not on file    Forced sexual activity: Not on file  Other Topics Concern  . Not on file  Social History Narrative   Lives at home with 1 of her two daughters   Right-handed.   2-4 cups caffeine daily.   Disabled     Review of Systems  Constitutional: Negative for appetite change, fever and unexpected weight change.  HENT: Negative.   Eyes: Negative.   Respiratory: Positive for cough, shortness of breath and wheezing.   Cardiovascular: Negative.   Gastrointestinal: Negative.   Endocrine: Negative.     Vitals:   09/09/18 1505  BP: 118/72  Pulse: 85  SpO2: 95%     Physical Exam   Constitutional: She appears well-developed and well-nourished.  HENT:  Head: Normocephalic.  Eyes: Pupils are equal, round, and reactive to light. Conjunctivae and EOM are normal. Right eye exhibits no discharge. Left eye exhibits no discharge.  Neck: Normal range of motion. Neck supple. No thyromegaly present.  Cardiovascular: Normal  rate.  Pulmonary/Chest: Effort normal and breath sounds normal. No respiratory distress. She has no wheezes. She has no rales. She exhibits no tenderness.  Abdominal: Soft. Bowel sounds are normal. She exhibits no distension. There is no abdominal tenderness. There is no rebound.   Data Reviewed: Recent chest x-ray reviewed showing no acute infiltrate, hyperinflated lung volumes, flattened diaphragms PFT shows no obstruction, no significant bronchodilator response, normal TLC, normal diffusing capacity CT scan of the chest in January-within normal limits  Assessment:   Chronic cough -Improving  Possible obstructive lung disease -No prior history of asthma -Shortness of breath wheezing-only uses albuterol as needed -This is been confirmed via PFT not showing any significant obstructive disease  Active smoker -Discussed smoking cessation -Encouraged to continue with smoking cessation efforts  30 pound weight gain in 6 months -We will obtain thyroid function test -Has been on Megace-this may be contributing  Plan/Recommendations:  Obtain thyroid function test Will reach out to her primary doctor regarding medication or weight gain  We will see her back in the office in about 6 months  Albuterol use as needed   Sherrilyn Rist MD [facility] 09/09/2018, 3:28 PM  CC: Rutherford Guys, MD

## 2018-09-09 NOTE — Patient Instructions (Signed)
Emphysema Your breathing study is within normal limits  Concerned about your weight gain-continue to pay attention to your weight We will reach out to your primary doctor Obtain thyroid function tests  I will see you back in 6 months

## 2018-09-09 NOTE — Progress Notes (Signed)
Full PFT performed today. °

## 2018-09-10 LAB — TSH: TSH: 0.86 u[IU]/mL (ref 0.35–4.50)

## 2018-09-10 LAB — T4: T4, Total: 5.8 ug/dL (ref 5.1–11.9)

## 2018-09-12 ENCOUNTER — Ambulatory Visit: Payer: Medicare Other | Admitting: Physical Therapy

## 2018-09-12 ENCOUNTER — Other Ambulatory Visit: Payer: Self-pay

## 2018-09-12 DIAGNOSIS — M62838 Other muscle spasm: Secondary | ICD-10-CM | POA: Diagnosis not present

## 2018-09-12 DIAGNOSIS — M545 Low back pain, unspecified: Secondary | ICD-10-CM

## 2018-09-13 NOTE — Therapy (Signed)
[place], [place], [address] Phone: [phone]   Fax:  [phone]  Physical Therapy Treatment  Patient Details  Name: Lauren Chapman MRN: [id number] Date of Birth: October 05, 1970 Referring Provider (PT): Benny Lennert MD   Encounter Date: 09/12/2018  PT End of Session - 09/12/18 1557    Visit Number  3    Number of Visits  12    Date for PT Re-Evaluation  09/26/18    Authorization Type  Medicare    PT Start Time  1550   Patient 5 minutes late   PT Stop Time  1628    PT Time Calculation (min)  38 min    Activity Tolerance  Patient tolerated treatment well    Behavior During Therapy  [facility] for tasks assessed/performed       Past Medical History:  Diagnosis Date  . Anxiety   . Bipolar affective psychosis ([place])   . Depression   . Emphysema lung ([place])   . GERD (gastroesophageal reflux disease)   . Migraine     Past Surgical History:  Procedure Laterality Date  . CARPAL TUNNEL RELEASE  2009  . CHOLECYSTECTOMY  1997  . GANGLION CYST EXCISION     RIGHT wrist  . KNEE ARTHROSCOPY WITH ANTERIOR CRUCIATE LIGAMENT (ACL) REPAIR Right 10/2015    There were no vitals filed for this visit.  Subjective Assessment - 09/13/18 0926    Subjective  Patient has had no pain. She has been doing her exercises and feeling better. She still has minor pain when she vaccums but otherwise she is doing well.    How long can you sit comfortably?  stiffens when she sits for too long    How long can you stand comfortably?  pain when standing through the whole shift    How long can you walk comfortably?  no limit    Diagnostic tests  X-ray: mild degenerative changes at L5 S1    Currently in Pain?  No/denies    Pain Frequency  Constant    Aggravating Factors   truning to the left    Pain Relieving Factors  rest    Multiple Pain Sites  No                       [facility] Adult PT Treatment/Exercise - 09/13/18 0001       Self-Care   Self-Care  Other Self-Care Comments    Other Self-Care Comments   reviewed POC going forward. Reviewed  how to progress her exercises and where to start if the pain comes back.       Lumbar Exercises: Stretches   Lower Trunk Rotation  10 seconds    Lower Trunk Rotation Limitations  in pain free range to improve rotatipon     Piriformis Stretch  2 reps;30 seconds    Piriformis Stretch Limitations  shown in sitting and standing       Lumbar Exercises: Supine   AB Set Limitations  reviewed abdominal breathing x5 advised with her respiratory  issues to be careful not to get herself out of breath     Clam Limitations  supine hip abduction green 2x10     Bent Knee Raise Limitations  2x10 reviewed how to make exercise harder     Bridge Limitations  x10 reviewed how to make exercise harder     Straight Leg Raises Limitations  x5 each leg  PT Education - 09/13/18 0927    Education Details  HEP, symptom mangement    Person(s) Educated  Patient    Methods  Explanation;Demonstration;Tactile cues;Verbal cues    Comprehension  Verbalized understanding;Returned demonstration;Verbal cues required;Tactile cues required       PT Short Term Goals - 09/12/18 1702      PT SHORT TERM GOAL #1   Title  Patient will demonstrate pain free left rotation    Baseline  no pain    Time  3    Period  Weeks    Status  Achieved      PT SHORT TERM GOAL #2   Title  Patient will demonstrate a good core contration without cueing    Baseline  good core contraction with all exercises    Time  3    Period  Weeks    Status  Achieved      PT SHORT TERM GOAL #3   Title  Patient will be independent with stretching program    Baseline  idepednent with basic program    Time  2    Period  Weeks    Status  Achieved        PT Long Term Goals - 09/12/18 1701      PT LONG TERM GOAL #1   Title  Patient will be independent with a complete back managment program    Baseline   reviewed complete program. Gave harder bands.    Time  6    Period  Weeks    Status  Achieved      PT LONG TERM GOAL #2   Title  Patient will stand at work for 6 hours without pain    Baseline  Patient has been standing but is not back to work yet    Time  6    Period  Weeks    Status  Achieved      PT LONG TERM GOAL #3   Title  Patient will sit for 2 hours without pain    Baseline  no pain sitting    Time  6    Period  Weeks    Status  Achieved            Plan - 09/12/18 1703    Clinical Impression Statement  Patient has reached goals for therapy. She is no longer having pain. She is able to do activity around her house. Shehas a 17% limiation on FOTO Her goal was to have a 30% limitation so she did very well with that. Her leg strength has improved and her core contraction has improved. She has a complete exercises program. Therapy has reviewed lifting. technique. She was able to complete safely. She has no need for skilled therapy.    Personal Factors and Comorbidities  Comorbidity 1;Comorbidity 2;Comorbidity 3+    Comorbidities  bi-polar, anxiety, depression    Examination-Activity Limitations  Stand;Lift;Squat    Examination-Participation Restrictions  Cleaning    Stability/Clinical Decision Making  Stable/Uncomplicated    Clinical Decision Making  Moderate    Rehab Potential  Excellent    PT Frequency  2x / week    PT Duration  6 weeks    PT Treatment/Interventions  ADLs/Self Care Home Management;Cryotherapy;Electrical Stimulation;Moist Heat;Traction;Gait training;Stair training;Functional mobility training;Therapeutic exercise;Therapeutic activities;Neuromuscular re-education;Patient/family education;Passive range of motion;Taping    PT Next Visit Plan  soft tissue mobilization to the lumbar spine, consider LAD, begin light core strengthening, consider bridging, hip abduction, standing core stabilization, at some  point she may benefit from 4 way hip stabilization, add  hamstring stretch    PT Home Exercise Plan  abdominal breathing, piriformis stretch, tennis ball trigger point release, LTR in pain freee range to improve rotation    Consulted and Agree with Plan of Care  Patient       Patient will benefit from skilled therapeutic intervention in order to improve the following deficits and impairments:  Decreased range of motion, Decreased activity tolerance, Increased fascial restricitons, Postural dysfunction, Pain  Visit Diagnosis: Acute right-sided low back pain without sciatica  Other muscle spasm  PHYSICAL THERAPY DISCHARGE SUMMARY  Visits from Start of Care: 3  Current functional level related to goals / functional outcomes: No pain with most activity  Remaining deficits: [place] pain with vacuuming  Education / Equipment: HEP Plan: Patient agrees to discharge.  Patient goals were met. Patient is being discharged due to meeting the stated rehab goals.  ?????       Problem List Patient Active Problem List   Diagnosis Date Noted  . Emphysema, unspecified ([place]) 05/17/2018  . Weakness 01/25/2017  . Chronic migraine 12/22/2014  . Encounter for other general counseling or advice on contraception 09/02/2014  . Bipolar disorder ([place]) 10/12/2011  . Anxiety and depression 10/12/2011  . GERD (gastroesophageal reflux disease) 10/12/2011    Carney Living PT DPT  09/13/2018, 9:28 AM  [facility] [address] [place], [place], [address] Phone: [phone]   Fax:  [phone]  Name: DENISSE WHITENACK MRN: [id number] Date of Birth: [birth year]

## 2018-09-18 ENCOUNTER — Telehealth: Payer: Self-pay | Admitting: Family Medicine

## 2018-09-18 NOTE — Telephone Encounter (Signed)
FMLA pprwrk dropped off 8/7 and pt has not received CB to pick up. Requesting CB on the status

## 2018-09-19 ENCOUNTER — Ambulatory Visit: Payer: Medicare Other | Admitting: Physical Therapy

## 2018-09-19 NOTE — Telephone Encounter (Signed)
FMLA pprwrk dropped off 8/7 and pt has not received CB to pick up. Requesting CB on the status pat would like a call asap! Patient is wanting to make sure pprwrk has been filed

## 2018-09-20 NOTE — Telephone Encounter (Signed)
Paper work has been giving to provider they are waiting on a call from the pt.

## 2018-09-23 ENCOUNTER — Telehealth: Payer: Self-pay | Admitting: Family Medicine

## 2018-09-23 NOTE — Telephone Encounter (Signed)
Spoke with pt about FMLA pprwrk and asst has been advised to call pt back today.  Pt received an e-mail that her FMLA is about to be denied if not sent in soon.

## 2018-09-23 NOTE — Telephone Encounter (Signed)
Pt recently saw Dr. Carlota Raspberry and was prescribed an inhaler but not antibiotics. Pt is still coughing and would like advice about antibiotics

## 2018-09-24 ENCOUNTER — Other Ambulatory Visit: Payer: Self-pay

## 2018-09-24 ENCOUNTER — Telehealth: Payer: Self-pay

## 2018-09-24 ENCOUNTER — Telehealth (INDEPENDENT_AMBULATORY_CARE_PROVIDER_SITE_OTHER): Payer: Medicare Other | Admitting: Family Medicine

## 2018-09-24 DIAGNOSIS — M545 Low back pain, unspecified: Secondary | ICD-10-CM

## 2018-09-24 DIAGNOSIS — R05 Cough: Secondary | ICD-10-CM

## 2018-09-24 DIAGNOSIS — R053 Chronic cough: Secondary | ICD-10-CM

## 2018-09-24 MED ORDER — PANTOPRAZOLE SODIUM 40 MG PO TBEC: 40.0000 mg | DELAYED_RELEASE_TABLET | Freq: Every day | ORAL | 2 refills | Status: DC

## 2018-09-24 MED ORDER — FLUTICASONE PROPIONATE 50 MCG/ACT NA SUSP: 2.0000 | Freq: Every day | NASAL | 6 refills | Status: DC

## 2018-09-24 NOTE — Telephone Encounter (Signed)
Pt fmla forms will be in the pick up box at the front desk

## 2018-09-24 NOTE — Progress Notes (Signed)
Pt is having lots of coughing and runny nose for 1 month. medication and pharmacy verified.

## 2018-09-24 NOTE — Progress Notes (Signed)
Virtual Visit Note  I connected with patient on 09/24/18 at 530pm by phone and verified that I am speaking with the correct person using two identifiers. Lauren Chapman is currently located at home and patient is currently with them during visit. The provider, Rutherford Guys, MD is located in their office at time of visit.  I discussed the limitations, risks, security and privacy concerns of performing an evaluation and management service by telephone and the availability of in person appointments. I also discussed with the patient that there may be a patient responsible charge related to this service. The patient expressed understanding and agreed to proceed.   CC: FMLA paperwork and f/u COPD exacerbation  HPI ? Seen in urgent care 07/27/2018 and then Dr Holly Bodily on July 8 and 22, 2020. 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 to bend over, reach over, twist Has not worked since then Completed PT successfully - all sx resolved, has FROM now  Seen in ER on 08/25/2018 and then virtually by Dr Karin Golden - fever, cough, malaise Negative covid continues to have dry hacking cough, at times makes her dry heave, albuterol does not help No SOB Treated with steroids, no abx Having mild clear rhinorrhea No PND, no CP, no edema, no hoarseness or dysphagia She reports reflux controlled with OTC nexium 20mg  daily, omeprazole does not work CXR in ER negative Saw pulm, on 8/17 - no changes, albuterol prn, PFT not showing any significant obstructive disease  Has had 30 lbs weight gain since started megace for DUB by obgyn Quit smoking   Allergies  Allergen Reactions  . Codeine Anaphylaxis    Anything with codeine    Prior to Admission medications   Medication Sig Start Date End Date Taking? Authorizing Provider  albuterol (PROVENTIL HFA;VENTOLIN HFA) 108 (90 Base) MCG/ACT inhaler Inhale 2 puffs into the lungs every 4 (four) hours as needed for wheezing or shortness of breath. 01/30/18   Olalere,  Cicero Duck A, MD  clonazePAM (KLONOPIN) 0.5 MG tablet Take 0.5 mg by mouth 3 (three) times daily.  12/04/17   [provider]  esomeprazole (NEXIUM) 20 MG capsule Take 20 mg by mouth daily at 12 noon.    [provider]  gabapentin (NEURONTIN) 800 MG tablet  07/02/18   [provider]  ibuprofen (ADVIL) 800 MG tablet Take 1 tablet (800 mg total) by mouth every 8 (eight) hours as needed. 09/03/18   Fontaine, Belinda Block, MD  lamoTRIgine (LAMICTAL) 150 MG tablet Take 150 mg by mouth daily.    [provider]  megestrol (MEGACE) 20 MG tablet Take 1 tablet (20 mg total) by mouth daily. 09/03/18   Fontaine, Belinda Block, MD  methocarbamol (ROBAXIN) 500 MG tablet Take 1 tablet (500 mg total) by mouth 2 (two) times daily. 08/14/18   Maryruth Hancock, MD  mirtazapine (REMERON) 15 MG tablet Take 15 mg by mouth at bedtime.  12/04/17   [provider]  naproxen (NAPROSYN) 500 MG tablet Take 1 tablet (500 mg total) by mouth 2 (two) times daily. 07/29/18   Bast, Tressia Miners A, NP  ondansetron (ZOFRAN) 4 MG tablet Take 4 mg by mouth every 8 (eight) hours as needed for nausea or vomiting.    [provider]  rizatriptan (MAXALT) 10 MG tablet Take 1 tab at onset of migraine.  May repeat in 2 hrs, if needed.  Max dose: 2 tabs/day. This is a 30 day prescription. 01/18/18   Marcial Pacas, MD  sertraline (ZOLOFT) 100 MG tablet Take 200 mg by mouth daily.     [provider]  tiZANidine (ZANAFLEX) 4 MG tablet TAKE 1 TABLET BY MOUTH EVERY 6 HOURS AS NEEDED FOR MUSCLE SPASMS. 01/18/18   Marcial Pacas, MD  varenicline (CHANTIX) 1 MG tablet Take 1 tablet (1 mg total) by mouth 2 (two) times daily. 07/11/18   Rutherford Guys, MD  VRAYLAR capsule Take 3 mg by mouth daily.  04/17/18   [provider]    Past Medical History:  Diagnosis Date  . Anxiety   . Bipolar affective psychosis ([place])   . Depression   . Emphysema lung ([place])   . GERD (gastroesophageal reflux disease)   .  Migraine     Past Surgical History:  Procedure Laterality Date  . CARPAL TUNNEL RELEASE  2009  . CHOLECYSTECTOMY  1997  . GANGLION CYST EXCISION     RIGHT wrist  . KNEE ARTHROSCOPY WITH ANTERIOR CRUCIATE LIGAMENT (ACL) REPAIR Right 10/2015    Social History   Tobacco Use  . Smoking status: Former Smoker    Packs/day: 0.50    Years: 33.00    Pack years: 16.50    Types: Cigarettes    Start date: 10/10/1981  . Smokeless tobacco: Never Used  Substance Use Topics  . Alcohol use: Never    Alcohol/week: 0.0 standard drinks    Frequency: Never    Family History  Problem Relation Age of Onset  . Depression Mother   . Cancer Mother        Cervical  . Alcohol abuse Father   . Crohn's disease Sister   . Stroke Brother 67  . Migraines Daughter   . GER disease Daughter   . Depression Daughter   . Migraines Daughter   . GER disease Daughter     ROS Per hpi  Objective  Vitals as reported by the patient: none   ASSESSMENT and PLAN  1. Chronic cough pulm eval negative. Trial of higher dose of PPI and flonase. Consider referral to ENT.   2. Acute midline low back pain without sciatica Resolved, completed PT, able to return to work full time without restrictions. FMLA paperwork completed and ready to be picked up  Other orders - pantoprazole (PROTONIX) 40 MG tablet; Take 1 tablet (40 mg total) by mouth daily. - fluticasone (FLONASE) 50 MCG/ACT nasal spray; Place 2 sprays into both nostrils daily.  FOLLOW-UP: 4 weeks   The above assessment and management plan was discussed with the patient. The patient verbalized understanding of and has agreed to the management plan. Patient is aware to call the clinic if symptoms persist or worsen. Patient is aware when to return to the clinic for a follow-up visit. Patient educated on when it is appropriate to go to the emergency department.    I provided 30 minutes of non-face-to-face time during this encounter.  Rutherford Guys,  MD [facility] at [facility] [place], [place] [address] Ph.  [phone] Fax [phone]

## 2018-09-24 NOTE — Telephone Encounter (Signed)
Dr. Pamella Pert please advise

## 2018-09-25 DIAGNOSIS — F250 Schizoaffective disorder, bipolar type: Secondary | ICD-10-CM | POA: Diagnosis not present

## 2018-09-26 ENCOUNTER — Other Ambulatory Visit: Payer: Self-pay

## 2018-09-26 ENCOUNTER — Ambulatory Visit (INDEPENDENT_AMBULATORY_CARE_PROVIDER_SITE_OTHER): Payer: Medicare Other

## 2018-09-26 ENCOUNTER — Encounter: Payer: Self-pay | Admitting: Gynecology

## 2018-09-26 ENCOUNTER — Ambulatory Visit (INDEPENDENT_AMBULATORY_CARE_PROVIDER_SITE_OTHER): Payer: Medicare Other | Admitting: Gynecology

## 2018-09-26 ENCOUNTER — Other Ambulatory Visit: Payer: Self-pay | Admitting: Gynecology

## 2018-09-26 VITALS — BP 118/76

## 2018-09-26 DIAGNOSIS — R1032 Left lower quadrant pain: Secondary | ICD-10-CM

## 2018-09-26 DIAGNOSIS — N840 Polyp of corpus uteri: Secondary | ICD-10-CM | POA: Diagnosis not present

## 2018-09-26 DIAGNOSIS — N926 Irregular menstruation, unspecified: Secondary | ICD-10-CM

## 2018-09-26 NOTE — Progress Notes (Signed)
    Lauren Chapman 09/07/1970 [id number]        48 y.o.  [id number] with a history of amenorrhea for several years and then heavy bleeding a year ago.  Evaluation showed a negative sonohysterogram and negative endometrial biopsy.  FSH was 32.  She was started on Megace switched to Aygestin continue to bleed irregularly and was switched back to Megace.  She has been having spotting on and off since then.  Past medical history,surgical history, problem list, medications, allergies, family history and social history were all reviewed and documented in the EPIC chart.  Directed ROS with pertinent positives and negatives documented in the history of present illness/assessment and plan.  Exam: Ivin Booty assistant BP 118/76 General appearance:  Normal Abdomen soft nontender without masses guarding rebound Pelvic external BUS vagina normal.  Cervix normal.  Uterus grossly normal midline mobile nontender.  Adnexa without masses or tenderness.  Ultrasound transvaginal shows uterus normal size with one small myoma measuring 2.48 cm.  Endometrial echo 4 mm.  Both ovaries visualized and normal.  Cul-de-sac negative.  Sonohysterogram performed, sterile technique, easy catheter introduction with small 4 mm polyps right lateral wall..  Endometrial sample taken.  Patient tolerated well.  Assessment/Plan:  48 y.o. [id number] with a small endometrial polyp and persistent spotting.  Discussed my recommendation proceed with hysteroscopy D&C with resection of the polyp and hopefully her bleeding will resolve.  She understands no guarantees.  Currently on Megace but wants to stop due to weight gain.  We will go ahead make arrangements to move towards scheduling her hysteroscopy D&C.  We discussed in general was involved with the procedure and the recovery.    Anastasio Auerbach MD, 2:18 PM 09/26/2018

## 2018-09-26 NOTE — Patient Instructions (Signed)
Office will call you to schedule surgery.

## 2018-09-26 NOTE — Addendum Note (Signed)
Addended by: Nelva Nay on: 09/26/2018 02:57 PM   Modules accepted: Orders

## 2018-09-27 NOTE — Telephone Encounter (Signed)
Pt was evaluated on 9/1 for cough and given medication at that time.

## 2018-10-02 ENCOUNTER — Telehealth: Payer: Self-pay

## 2018-10-02 MED ORDER — MISOPROSTOL 100 MCG PO TABS: ORAL_TABLET | ORAL | 0 refills | Status: DC

## 2018-10-02 NOTE — Telephone Encounter (Signed)
Spoke with patient about scheduling surgery. She opted for 11/13/18 at 9:00am at [facility].  We discussed the need for Cytotec tab vaginally hs night before surgery and Rx was sent. Patient advised she might have light bleeding/spotting or cramping with using this tablet but no worries just means it is working.  I scheduled her for Covid test accordingly and advised her to quarantine from time of test until surgery only going out for medical visits/med emergencies.  Rosemarie Ax will call her to schedule pre op appt. [facility] pamphlet and Covid screen info sheet mailed to patient.

## 2018-10-03 DIAGNOSIS — F250 Schizoaffective disorder, bipolar type: Secondary | ICD-10-CM | POA: Diagnosis not present

## 2018-10-07 ENCOUNTER — Telehealth: Payer: Self-pay

## 2018-10-07 NOTE — Telephone Encounter (Addendum)
Patient called because her My Chart shows a result final from her last visit on 9/3 but she cannot access it. The only result I see is her pathology report.  I do not see where we contacted her with result. What to inform?

## 2018-10-07 NOTE — Telephone Encounter (Signed)
The biopsy from the sonohysterogram showed fragments of endometrial polyp.  She should be in the process of arranging for a hysteroscopy D&C to remove any remaining polyp.

## 2018-10-07 NOTE — Telephone Encounter (Signed)
Per DPR access note left message in her voice mail with result. Surgery is already scheduled.

## 2018-10-16 ENCOUNTER — Ambulatory Visit (INDEPENDENT_AMBULATORY_CARE_PROVIDER_SITE_OTHER): Payer: Medicare Other

## 2018-10-16 ENCOUNTER — Encounter: Payer: Self-pay | Admitting: Gynecology

## 2018-10-16 ENCOUNTER — Ambulatory Visit (HOSPITAL_COMMUNITY)
Admission: EM | Admit: 2018-10-16 | Discharge: 2018-10-16 | Disposition: A | Discharge status: Home / Self Care | Payer: Medicare Other | Attending: Family Medicine | Admitting: Family Medicine

## 2018-10-16 ENCOUNTER — Other Ambulatory Visit: Payer: Self-pay

## 2018-10-16 ENCOUNTER — Encounter (HOSPITAL_COMMUNITY): Payer: Self-pay | Admitting: Family Medicine

## 2018-10-16 DIAGNOSIS — Z23 Encounter for immunization: Secondary | ICD-10-CM

## 2018-10-16 DIAGNOSIS — S97122A Crushing injury of left lesser toe(s), initial encounter: Secondary | ICD-10-CM | POA: Diagnosis not present

## 2018-10-16 DIAGNOSIS — S91209A Unspecified open wound of unspecified toe(s) with damage to nail, initial encounter: Secondary | ICD-10-CM

## 2018-10-16 DIAGNOSIS — M7731 Calcaneal spur, right foot: Secondary | ICD-10-CM | POA: Diagnosis not present

## 2018-10-16 DIAGNOSIS — S91204A Unspecified open wound of right lesser toe(s) with damage to nail, initial encounter: Secondary | ICD-10-CM | POA: Diagnosis not present

## 2018-10-16 DIAGNOSIS — M19071 Primary osteoarthritis, right ankle and foot: Secondary | ICD-10-CM | POA: Diagnosis not present

## 2018-10-16 DIAGNOSIS — S99821A Other specified injuries of right foot, initial encounter: Secondary | ICD-10-CM | POA: Diagnosis not present

## 2018-10-16 MED ORDER — MUPIROCIN 2 % EX OINT: 1.0000 "application " | TOPICAL_OINTMENT | Freq: Two times a day (BID) | CUTANEOUS | 1 refills | Status: DC

## 2018-10-16 MED ORDER — TETANUS-DIPHTH-ACELL PERTUSSIS 5-2.5-18.5 LF-MCG/0.5 IM SUSP
INTRAMUSCULAR | Status: AC
  Filled 2018-10-16: qty 0.5

## 2018-10-16 MED ORDER — TETANUS-DIPHTH-ACELL PERTUSSIS 5-2.5-18.5 LF-MCG/0.5 IM SUSP
0.5000 mL | Freq: Once | INTRAMUSCULAR | Status: AC
  Administered 2018-10-16: 0.5 mL via INTRAMUSCULAR

## 2018-10-16 MED ORDER — DICLOFENAC SODIUM 75 MG PO TBEC: 75.0000 mg | DELAYED_RELEASE_TABLET | Freq: Two times a day (BID) | ORAL | 0 refills | Status: DC

## 2018-10-16 NOTE — ED Provider Notes (Addendum)
[facility]    CSN: [id number] Arrival date & time: 10/16/18  1128      History   Chief Complaint Chief Complaint  Patient presents with  . Foot Pain    HPI Lauren Chapman is a 48 y.o. female.   This is a 48 year old established [facility] urgent care patient who presents with a foot injury.  He had a crush injury to the right pinky toe yesterday and it has continued to bleed.  She believes that the nail was a avulsed.  Patient was sent home from work [employer]) because of the bleeding.     Past Medical History:  Diagnosis Date  . Anxiety   . Bipolar affective psychosis ([place])   . Depression   . Emphysema lung ([place])   . GERD (gastroesophageal reflux disease)   . Migraine     Patient Active Problem List   Diagnosis Date Noted  . Emphysema, unspecified ([place]) 05/17/2018  . Weakness 01/25/2017  . Chronic migraine 12/22/2014  . Encounter for other general counseling or advice on contraception 09/02/2014  . Bipolar disorder ([place]) 10/12/2011  . Anxiety and depression 10/12/2011  . GERD (gastroesophageal reflux disease) 10/12/2011    Past Surgical History:  Procedure Laterality Date  . CARPAL TUNNEL RELEASE  2009  . CHOLECYSTECTOMY  1997  . GANGLION CYST EXCISION     RIGHT wrist  . KNEE ARTHROSCOPY WITH ANTERIOR CRUCIATE LIGAMENT (ACL) REPAIR Right 10/2015    OB History    Gravida  3   Para  2   Term  2   Preterm  0   AB  1   Living  2     SAB  1   TAB  0   Ectopic  0   Multiple  0   Live Births               Home Medications    Prior to Admission medications   Medication Sig Start Date End Date Taking? Authorizing Provider  albuterol (PROVENTIL HFA;VENTOLIN HFA) 108 (90 Base) MCG/ACT inhaler Inhale 2 puffs into the lungs every 4 (four) hours as needed for wheezing or shortness of breath. 01/30/18   Olalere, Cicero Duck A, MD  clonazePAM (KLONOPIN) 0.5 MG tablet Take 0.5 mg by mouth 3 (three) times daily.  12/04/17   [provider]  diclofenac (VOLTAREN) 75 MG EC tablet Take 1 tablet (75 mg total) by mouth 2 (two) times daily. 10/16/18   Robyn Haber, MD  fluticasone (FLONASE) 50 MCG/ACT nasal spray Place 2 sprays into both nostrils daily. Patient not taking: Reported on 09/26/2018 09/24/18   Rutherford Guys, MD  gabapentin (NEURONTIN) 800 MG tablet  07/02/18   [provider]  ibuprofen (ADVIL) 800 MG tablet Take 1 tablet (800 mg total) by mouth every 8 (eight) hours as needed. 09/03/18   Fontaine, Belinda Block, MD  lamoTRIgine (LAMICTAL) 150 MG tablet Take 150 mg by mouth daily.    [provider]  megestrol (MEGACE) 20 MG tablet Take 1 tablet (20 mg total) by mouth daily. 09/03/18   Fontaine, Belinda Block, MD  methocarbamol (ROBAXIN) 500 MG tablet Take 1 tablet (500 mg total) by mouth 2 (two) times daily. 08/14/18   Maryruth Hancock, MD  mirtazapine (REMERON) 15 MG tablet Take 15 mg by mouth at bedtime.  12/04/17   [provider]  misoprostol (CYTOTEC) 100 MCG tablet Insert tab intravaginally hs night before surgery. 10/02/18   Fontaine, Belinda Block,  MD  mupirocin ointment (BACTROBAN) 2 % Apply 1 application topically 2 (two) times daily. 10/16/18   Robyn Haber, MD  naproxen (NAPROSYN) 500 MG tablet Take 1 tablet (500 mg total) by mouth 2 (two) times daily. 07/29/18   Bast, Tressia Miners A, NP  ondansetron (ZOFRAN) 4 MG tablet Take 4 mg by mouth every 8 (eight) hours as needed for nausea or vomiting.    [provider]  pantoprazole (PROTONIX) 40 MG tablet Take 1 tablet (40 mg total) by mouth daily. 09/24/18   Rutherford Guys, MD  rizatriptan (MAXALT) 10 MG tablet Take 1 tab at onset of migraine.  May repeat in 2 hrs, if needed.  Max dose: 2 tabs/day. This is a 30 day prescription. 01/18/18   Marcial Pacas, MD  sertraline (ZOLOFT) 100 MG tablet Take 200 mg by mouth daily.     [provider]  tiZANidine (ZANAFLEX) 4 MG tablet TAKE 1 TABLET BY MOUTH EVERY 6 HOURS AS NEEDED FOR MUSCLE SPASMS.  01/18/18   Marcial Pacas, MD  varenicline (CHANTIX) 1 MG tablet Take 1 tablet (1 mg total) by mouth 2 (two) times daily. 07/11/18   Rutherford Guys, MD  VRAYLAR capsule Take 4.5 mg by mouth daily.  04/17/18   [provider]    Family History Family History  Problem Relation Age of Onset  . Depression Mother   . Cancer Mother        Cervical  . Alcohol abuse Father   . Crohn's disease Sister   . Stroke Brother 33  . Migraines Daughter   . GER disease Daughter   . Depression Daughter   . Migraines Daughter   . GER disease Daughter     Social History Social History   Tobacco Use  . Smoking status: Former Smoker    Packs/day: 0.50    Years: 33.00    Pack years: 16.50    Types: Cigarettes    Start date: 10/10/1981  . Smokeless tobacco: Never Used  Substance Use Topics  . Alcohol use: Never    Alcohol/week: 0.0 standard drinks    Frequency: Never  . Drug use: No     Allergies   Codeine   Review of Systems Review of Systems  All other systems reviewed and are negative.    Physical Exam Triage Vital Signs ED Triage Vitals [10/16/18 1145]  Enc Vitals Group     BP 119/74     Pulse Rate 60     Resp 16     Temp 98.1 F (36.7 C)     Temp Source Temporal     SpO2 99 %     Weight      Height      Head Circumference      Peak Flow      Pain Score      Pain Loc      Pain Edu?      Excl. in [place]?    No data found.  Updated Vital Signs BP 119/74 (BP Location: Right Arm)   Pulse 60   Temp 98.1 F (36.7 C) (Temporal)   Resp 16   Wt 86.2 kg   LMP 06/07/2014 (Exact Date) Comment: irregular cycles  SpO2 99%   BMI 32.61 kg/m   Physical Exam Vitals signs and nursing note reviewed.  Constitutional:      Appearance: Normal appearance. She is obese.  Eyes:     Conjunctiva/sclera: Conjunctivae normal.  Neck:     Musculoskeletal: Normal  range of motion and neck supple.  Cardiovascular:     Rate and Rhythm: Normal rate.  Pulmonary:     Effort:  Pulmonary effort is normal.  Musculoskeletal:        General: Swelling, tenderness and signs of injury present.     Comments: Patient has an ecchymotic proximal right pinky toe with loose nail that has been partially avulsed from the cuticle.  Skin:    General: Skin is warm.     Findings: Bruising present.     Comments: The evulsed nail was removed after thoroughly washing the affected toe with Shur-Clens, anesthetizing the distal phalanx with 1% Xylocaine.  Neurological:     General: No focal deficit present.     Mental Status: She is alert and oriented to person, place, and time.  Psychiatric:        Mood and Affect: Mood normal.      UC Treatments / Results  Labs (all labs ordered are listed, but only abnormal results are displayed) Labs Reviewed - No data to display  EKG   Radiology Dg Foot Complete Right  Result Date: 10/16/2018 CLINICAL DATA:  Crush injury of the fifth digit EXAM: RIGHT FOOT COMPLETE - 3+ VIEW COMPARISON:  None. FINDINGS: No acute fracture or dislocation. Mild osteoarthritis of the first MTP joint. Small plantar calcaneal spur. No aggressive osseous lesion. Soft tissues are unremarkable. IMPRESSION: No acute osseous injury of the right foot. Electronically Signed   By: Kathreen Devoid   On: 10/16/2018 12:31    Procedures Procedures (including critical care time)  Medications Ordered in UC Medications  Tdap (BOOSTRIX) injection 0.5 mL (0.5 mLs Intramuscular Given 10/16/18 1235)  Tdap (BOOSTRIX) 5-2.5-18.5 LF-MCG/0.5 injection (has no administration in time range)    Initial Impression / Assessment and Plan / UC Course  I have reviewed the triage vital signs and the nursing notes.  Pertinent labs & imaging results that were available during my care of the patient were reviewed by me and considered in my medical decision making (see chart for details).    Final Clinical Impressions(s) / UC Diagnoses   Final diagnoses:  Avulsion of toenail, initial  encounter     Discharge Instructions     Wash the toe and soap and water gently twice a day for the next week, following the washing with the ointment and then buddy taping the toes together.    ED Prescriptions    Medication Sig Dispense Auth. Provider   mupirocin ointment (BACTROBAN) 2 % Apply 1 application topically 2 (two) times daily. 22 g Robyn Haber, MD   diclofenac (VOLTAREN) 75 MG EC tablet Take 1 tablet (75 mg total) by mouth 2 (two) times daily. 14 tablet Robyn Haber, MD     I have reviewed the PDMP during this encounter.   Robyn Haber, MD 10/16/18 1302    Robyn Haber, MD 10/16/18 1310

## 2018-10-16 NOTE — ED Triage Notes (Signed)
Pt states she has injured her right foot ( little toe ) this happened yesterday. Pt ran over her toe with her chair. This happened yesterday.

## 2018-10-16 NOTE — Discharge Instructions (Addendum)
Wash the toe and soap and water gently twice a day for the next week, following the washing with the ointment and then buddy taping the toes together.

## 2018-10-22 ENCOUNTER — Ambulatory Visit (HOSPITAL_COMMUNITY)
Admission: EM | Admit: 2018-10-22 | Discharge: 2018-10-22 | Disposition: A | Discharge status: Home / Self Care | Payer: Medicare Other | Attending: Family Medicine | Admitting: Family Medicine

## 2018-10-22 ENCOUNTER — Encounter (HOSPITAL_COMMUNITY): Payer: Self-pay

## 2018-10-22 DIAGNOSIS — S99921S Unspecified injury of right foot, sequela: Secondary | ICD-10-CM

## 2018-10-22 DIAGNOSIS — S91204S Unspecified open wound of right lesser toe(s) with damage to nail, sequela: Secondary | ICD-10-CM

## 2018-10-22 MED ORDER — CEPHALEXIN 500 MG PO CAPS: 500.0000 mg | ORAL_CAPSULE | Freq: Two times a day (BID) | ORAL | 0 refills | Status: DC

## 2018-10-22 NOTE — Discharge Instructions (Signed)
Keep dry No antibiotic ointment Continue to buddy tape toes as long as it increases your comfort Expect gradual improvement Take antibiotic 2 times a day for 5 days

## 2018-10-22 NOTE — ED Triage Notes (Signed)
Patient report she was seeing at this UC on 10/16/2018 for an injury in her right little toe and the toe continues to bleed.

## 2018-10-22 NOTE — ED Provider Notes (Signed)
[facility]    CSN: [id number] Arrival date & time: 10/22/18  0909      History   Chief Complaint Chief Complaint  Patient presents with  . Toe Injury    HPI Lauren Chapman is a 48 y.o. female.   HPI  Refer to Dr. Pauletta Browns note on 10/16/2018 I reviewed the x-ray performed that date Patient is here for follow-up.  States that the wound is still painful.  States that the wound is still bleeding by the end of the day.  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 She is here to make sure the wound is healing well.  She thinks the toe looks more red and swollen.  She still has a significant amount of pain  Past Medical History:  Diagnosis Date  . Anxiety   . Bipolar affective psychosis ([place])   . Depression   . Emphysema lung ([place])   . GERD (gastroesophageal reflux disease)   . Migraine     Patient Active Problem List   Diagnosis Date Noted  . Emphysema, unspecified ([place]) 05/17/2018  . Weakness 01/25/2017  . Chronic migraine 12/22/2014  . Encounter for other general counseling or advice on contraception 09/02/2014  . Bipolar disorder ([place]) 10/12/2011  . Anxiety and depression 10/12/2011  . GERD (gastroesophageal reflux disease) 10/12/2011    Past Surgical History:  Procedure Laterality Date  . CARPAL TUNNEL RELEASE  2009  . CHOLECYSTECTOMY  1997  . GANGLION CYST EXCISION     RIGHT wrist  . KNEE ARTHROSCOPY WITH ANTERIOR CRUCIATE LIGAMENT (ACL) REPAIR Right 10/2015    OB History    Gravida  3   Para  2   Term  2   Preterm  0   AB  1   Living  2     SAB  1   TAB  0   Ectopic  0   Multiple  0   Live Births               Home Medications    Prior to Admission medications   Medication Sig Start Date End Date Taking? Authorizing Provider  albuterol (PROVENTIL HFA;VENTOLIN HFA) 108 (90 Base) MCG/ACT inhaler Inhale 2 puffs into the lungs every 4 (four) hours as needed for wheezing or  shortness of breath. 01/30/18   Olalere, Cicero Duck A, MD  cephALEXin (KEFLEX) 500 MG capsule Take 1 capsule (500 mg total) by mouth 2 (two) times daily. 10/22/18   Raylene Everts, MD  clonazePAM (KLONOPIN) 0.5 MG tablet Take 0.5 mg by mouth 3 (three) times daily.  12/04/17   [provider]  diclofenac (VOLTAREN) 75 MG EC tablet Take 1 tablet (75 mg total) by mouth 2 (two) times daily. 10/16/18   Robyn Haber, MD  gabapentin (NEURONTIN) 800 MG tablet  07/02/18   [provider]  ibuprofen (ADVIL) 800 MG tablet Take 1 tablet (800 mg total) by mouth every 8 (eight) hours as needed. 09/03/18   Fontaine, Belinda Block, MD  lamoTRIgine (LAMICTAL) 150 MG tablet Take 150 mg by mouth daily.    [provider]  megestrol (MEGACE) 20 MG tablet Take 1 tablet (20 mg total) by mouth daily. 09/03/18   Fontaine, Belinda Block, MD  methocarbamol (ROBAXIN) 500 MG tablet Take 1 tablet (500 mg total) by mouth 2 (two) times daily. 08/14/18   Maryruth Hancock, MD  mirtazapine (REMERON) 15 MG tablet Take 15  mg by mouth at bedtime.  12/04/17   [provider]  misoprostol (CYTOTEC) 100 MCG tablet Insert tab intravaginally hs night before surgery. 10/02/18   Fontaine, Belinda Block, MD  mupirocin ointment (BACTROBAN) 2 % Apply 1 application topically 2 (two) times daily. 10/16/18   Robyn Haber, MD  naproxen (NAPROSYN) 500 MG tablet Take 1 tablet (500 mg total) by mouth 2 (two) times daily. 07/29/18   Bast, Tressia Miners A, NP  ondansetron (ZOFRAN) 4 MG tablet Take 4 mg by mouth every 8 (eight) hours as needed for nausea or vomiting.    [provider]  pantoprazole (PROTONIX) 40 MG tablet Take 1 tablet (40 mg total) by mouth daily. 09/24/18   Rutherford Guys, MD  rizatriptan (MAXALT) 10 MG tablet Take 1 tab at onset of migraine.  May repeat in 2 hrs, if needed.  Max dose: 2 tabs/day. This is a 30 day prescription. 01/18/18   Marcial Pacas, MD  sertraline (ZOLOFT) 100 MG tablet Take 200 mg by mouth daily.      [provider]  tiZANidine (ZANAFLEX) 4 MG tablet TAKE 1 TABLET BY MOUTH EVERY 6 HOURS AS NEEDED FOR MUSCLE SPASMS. 01/18/18   Marcial Pacas, MD  varenicline (CHANTIX) 1 MG tablet Take 1 tablet (1 mg total) by mouth 2 (two) times daily. 07/11/18   Rutherford Guys, MD  VRAYLAR capsule Take 4.5 mg by mouth daily.  04/17/18   [provider]  fluticasone (FLONASE) 50 MCG/ACT nasal spray Place 2 sprays into both nostrils daily. Patient not taking: Reported on 09/26/2018 09/24/18 10/22/18  Rutherford Guys, MD    Family History Family History  Problem Relation Age of Onset  . Depression Mother   . Cancer Mother        Cervical  . Alcohol abuse Father   . Crohn's disease Sister   . Stroke Brother 80  . Migraines Daughter   . GER disease Daughter   . Depression Daughter   . Migraines Daughter   . GER disease Daughter     Social History Social History   Tobacco Use  . Smoking status: Former Smoker    Packs/day: 0.50    Years: 33.00    Pack years: 16.50    Types: Cigarettes    Start date: 10/10/1981  . Smokeless tobacco: Never Used  Substance Use Topics  . Alcohol use: Never    Alcohol/week: 0.0 standard drinks    Frequency: Never  . Drug use: No     Allergies   Codeine   Review of Systems Review of Systems  Constitutional: Negative for chills and fever.  HENT: Negative for ear pain and sore throat.   Eyes: Negative for pain and visual disturbance.  Respiratory: Negative for cough and shortness of breath.   Cardiovascular: Negative for chest pain and palpitations.  Gastrointestinal: Negative for abdominal pain and vomiting.  Genitourinary: Negative for dysuria and hematuria.  Musculoskeletal: Positive for gait problem. Negative for arthralgias and back pain.  Skin: Positive for wound. Negative for color change and rash.  Neurological: Negative for seizures and syncope.  All other systems reviewed and are negative.    Physical Exam Triage Vital Signs ED  Triage Vitals  Enc Vitals Group     BP 10/22/18 0936 126/74     Pulse Rate 10/22/18 0936 70     Resp 10/22/18 0936 16     Temp 10/22/18 0936 98.4 F (36.9 C)     Temp src --  SpO2 10/22/18 0936 96 %     Weight --      Height --      Head Circumference --      Peak Flow --      Pain Score 10/22/18 0934 7     Pain Loc --      Pain Edu? --      Excl. in [place]? --    No data found.  Updated Vital Signs BP 126/74 (BP Location: Left Arm)   Pulse 70   Temp 98.4 F (36.9 C)   Resp 16   LMP 06/07/2014 (Exact Date) Comment: irregular cycles  SpO2 96%       Physical Exam Constitutional:      General: She is not in acute distress.    Appearance: She is well-developed.  HENT:     Head: Normocephalic and atraumatic.  Eyes:     Conjunctiva/sclera: Conjunctivae normal.     Pupils: Pupils are equal, round, and reactive to light.  Neck:     Musculoskeletal: Normal range of motion.  Cardiovascular:     Rate and Rhythm: Normal rate.  Pulmonary:     Effort: Pulmonary effort is normal. No respiratory distress.  Abdominal:     General: There is no distension.     Palpations: Abdomen is soft.  Musculoskeletal: Normal range of motion.  Skin:    General: Skin is warm and dry.     Comments: The little toe on the right foot has a superficial abrasion over the IP joint.  The toe itself is moderately swollen and erythematous.  The nail is avulsed.  The nail bed appears to be dry.  Neurological:     Mental Status: She is alert.      UC Treatments / Results  Labs (all labs ordered are listed, but only abnormal results are displayed) Labs Reviewed - No data to display  EKG   Radiology No results found.  Procedures Procedures (including critical care time)  Medications Ordered in UC Medications - No data to display  Initial Impression / Assessment and Plan / UC Course  I have reviewed the triage vital signs and the nursing notes.  Pertinent labs & imaging results that  were available during my care of the patient were reviewed by me and considered in my medical decision making (see chart for details).     I explained to the patient that the repetitive standing walking movement was rubbing and irritating the wound.  She has some low-grade infection.  No serious complication.  A little bleeding is to be expected.  I think this will heal without complication over the next couple of weeks. Because of her concern over the bleeding and drainage I did seal both of the wounds with Dermabond.  If she keeps this try for couple of days I think will help promote healing. Final Clinical Impressions(s) / UC Diagnoses   Final diagnoses:  Toe injury, right, sequela     Discharge Instructions     Keep dry No antibiotic ointment Continue to buddy tape toes as long as it increases your comfort Expect gradual improvement Take antibiotic 2 times a day for 5 days    ED Prescriptions    Medication Sig Dispense Auth. Provider   cephALEXin (KEFLEX) 500 MG capsule Take 1 capsule (500 mg total) by mouth 2 (two) times daily. 10 capsule Raylene Everts, MD     PDMP not reviewed this encounter.   Raylene Everts, MD 10/22/18 1032

## 2018-10-24 DIAGNOSIS — F250 Schizoaffective disorder, bipolar type: Secondary | ICD-10-CM | POA: Diagnosis not present

## 2018-11-04 ENCOUNTER — Other Ambulatory Visit: Payer: Self-pay

## 2018-11-04 ENCOUNTER — Ambulatory Visit (INDEPENDENT_AMBULATORY_CARE_PROVIDER_SITE_OTHER): Payer: Medicare Other

## 2018-11-04 ENCOUNTER — Ambulatory Visit (HOSPITAL_COMMUNITY)
Admission: EM | Admit: 2018-11-04 | Discharge: 2018-11-04 | Disposition: A | Discharge status: Home / Self Care | Payer: Medicare Other | Attending: Family Medicine | Admitting: Family Medicine

## 2018-11-04 DIAGNOSIS — M79671 Pain in right foot: Secondary | ICD-10-CM | POA: Diagnosis not present

## 2018-11-04 DIAGNOSIS — S9031XA Contusion of right foot, initial encounter: Secondary | ICD-10-CM | POA: Diagnosis not present

## 2018-11-04 DIAGNOSIS — S99921A Unspecified injury of right foot, initial encounter: Secondary | ICD-10-CM | POA: Diagnosis not present

## 2018-11-04 MED ORDER — KETOROLAC TROMETHAMINE 30 MG/ML IJ SOLN
30.0000 mg | Freq: Once | INTRAMUSCULAR | Status: AC
  Administered 2018-11-04: 17:00:00 30 mg via INTRAMUSCULAR

## 2018-11-04 MED ORDER — KETOROLAC TROMETHAMINE 30 MG/ML IJ SOLN
INTRAMUSCULAR | Status: AC
  Filled 2018-11-04: qty 1

## 2018-11-04 MED ORDER — BACITRACIN ZINC 500 UNIT/GM EX OINT
TOPICAL_OINTMENT | CUTANEOUS | Status: AC
  Filled 2018-11-04: qty 0.9

## 2018-11-04 NOTE — ED Triage Notes (Signed)
Pt present yesterday she dropped a 3 ring punch holder on her foot. Pt states that her foot has swelling and throbbing.

## 2018-11-04 NOTE — Discharge Instructions (Addendum)
No fractures on xray, pain from bruising from dropping object onto the foot.  Wear supportive shoes.  Cleanse toe and wound daily, place bandage on it to keep it protected.  Ice and elevate the foot.  Use of ibuprofen to help with pain.  Follow up with your primary care provider as needed for persistent symptoms.

## 2018-11-04 NOTE — ED Provider Notes (Signed)
[place]    CSN: [id number] Arrival date & time: 11/04/18  1432      History   Chief Complaint Chief Complaint  Patient presents with  . Foot Injury    HPI Lauren Chapman is a 48 y.o. female.   Lauren Chapman presents with complaints of foot pain. Injury to the toe and toe nail 9/23 which resulted in nail removal. States she has still had some soreness to the affected toe. Last night at work however, accidentally dropped a three hold punch on her foot, and now has more pain and a new abrasion. States she has been taking ibuprofen as well as pain medication she was prescribed at last visit, which have minimally helped. Her tdap is UTD. xrays obtained at initial visit on 9/23 which were negative. History  Of migraines, anxiety, depression, bipolar, gerd.     ROS per HPI, negative if not otherwise mentioned.      Past Medical History:  Diagnosis Date  . Anxiety   . Bipolar affective psychosis ([place])   . Depression   . Emphysema lung ([place])   . GERD (gastroesophageal reflux disease)   . Migraine     Patient Active Problem List   Diagnosis Date Noted  . Emphysema, unspecified ([place]) 05/17/2018  . Weakness 01/25/2017  . Chronic migraine 12/22/2014  . Encounter for other general counseling or advice on contraception 09/02/2014  . Bipolar disorder ([place]) 10/12/2011  . Anxiety and depression 10/12/2011  . GERD (gastroesophageal reflux disease) 10/12/2011    Past Surgical History:  Procedure Laterality Date  . CARPAL TUNNEL RELEASE  2009  . CHOLECYSTECTOMY  1997  . GANGLION CYST EXCISION     RIGHT wrist  . KNEE ARTHROSCOPY WITH ANTERIOR CRUCIATE LIGAMENT (ACL) REPAIR Right 10/2015    OB History    Gravida  3   Para  2   Term  2   Preterm  0   AB  1   Living  2     SAB  1   TAB  0   Ectopic  0   Multiple  0   Live Births               Home Medications    Prior to Admission medications   Medication Sig Start Date End Date  Taking? Authorizing Provider  albuterol (PROVENTIL HFA;VENTOLIN HFA) 108 (90 Base) MCG/ACT inhaler Inhale 2 puffs into the lungs every 4 (four) hours as needed for wheezing or shortness of breath. 01/30/18   Olalere, Cicero Duck A, MD  cephALEXin (KEFLEX) 500 MG capsule Take 1 capsule (500 mg total) by mouth 2 (two) times daily. 10/22/18   Raylene Everts, MD  clonazePAM (KLONOPIN) 0.5 MG tablet Take 0.5 mg by mouth 3 (three) times daily.  12/04/17   [provider]  diclofenac (VOLTAREN) 75 MG EC tablet Take 1 tablet (75 mg total) by mouth 2 (two) times daily. 10/16/18   Robyn Haber, MD  gabapentin (NEURONTIN) 800 MG tablet  07/02/18   [provider]  ibuprofen (ADVIL) 800 MG tablet Take 1 tablet (800 mg total) by mouth every 8 (eight) hours as needed. 09/03/18   Fontaine, Belinda Block, MD  lamoTRIgine (LAMICTAL) 150 MG tablet Take 150 mg by mouth daily.    [provider]  megestrol (MEGACE) 20 MG tablet Take 1 tablet (20 mg total) by mouth daily. 09/03/18   Fontaine, Belinda Block, MD  methocarbamol (ROBAXIN) 500 MG tablet Take 1 tablet (500  mg total) by mouth 2 (two) times daily. 08/14/18   Maryruth Hancock, MD  mirtazapine (REMERON) 15 MG tablet Take 15 mg by mouth at bedtime.  12/04/17   [provider]  misoprostol (CYTOTEC) 100 MCG tablet Insert tab intravaginally hs night before surgery. 10/02/18   Fontaine, Belinda Block, MD  mupirocin ointment (BACTROBAN) 2 % Apply 1 application topically 2 (two) times daily. 10/16/18   Robyn Haber, MD  naproxen (NAPROSYN) 500 MG tablet Take 1 tablet (500 mg total) by mouth 2 (two) times daily. 07/29/18   Bast, Tressia Miners A, NP  ondansetron (ZOFRAN) 4 MG tablet Take 4 mg by mouth every 8 (eight) hours as needed for nausea or vomiting.    [provider]  pantoprazole (PROTONIX) 40 MG tablet Take 1 tablet (40 mg total) by mouth daily. 09/24/18   Rutherford Guys, MD  rizatriptan (MAXALT) 10 MG tablet Take 1 tab at onset of migraine.  May  repeat in 2 hrs, if needed.  Max dose: 2 tabs/day. This is a 30 day prescription. 01/18/18   Marcial Pacas, MD  sertraline (ZOLOFT) 100 MG tablet Take 200 mg by mouth daily.     [provider]  tiZANidine (ZANAFLEX) 4 MG tablet TAKE 1 TABLET BY MOUTH EVERY 6 HOURS AS NEEDED FOR MUSCLE SPASMS. 01/18/18   Marcial Pacas, MD  varenicline (CHANTIX) 1 MG tablet Take 1 tablet (1 mg total) by mouth 2 (two) times daily. 07/11/18   Rutherford Guys, MD  VRAYLAR capsule Take 4.5 mg by mouth daily.  04/17/18   [provider]  fluticasone (FLONASE) 50 MCG/ACT nasal spray Place 2 sprays into both nostrils daily. Patient not taking: Reported on 09/26/2018 09/24/18 10/22/18  Rutherford Guys, MD    Family History Family History  Problem Relation Age of Onset  . Depression Mother   . Cancer Mother        Cervical  . Alcohol abuse Father   . Crohn's disease Sister   . Stroke Brother 28  . Migraines Daughter   . GER disease Daughter   . Depression Daughter   . Migraines Daughter   . GER disease Daughter     Social History Social History   Tobacco Use  . Smoking status: Former Smoker    Packs/day: 0.50    Years: 33.00    Pack years: 16.50    Types: Cigarettes    Start date: 10/10/1981  . Smokeless tobacco: Never Used  Substance Use Topics  . Alcohol use: Never    Alcohol/week: 0.0 standard drinks    Frequency: Never  . Drug use: No     Allergies   Codeine   Review of Systems Review of Systems   Physical Exam Triage Vital Signs ED Triage Vitals  Enc Vitals Group     BP 11/04/18 1520 114/69     Pulse Rate 11/04/18 1520 76     Resp 11/04/18 1520 16     Temp 11/04/18 1520 98.1 F (36.7 C)     Temp Source 11/04/18 1520 Temporal     SpO2 11/04/18 1520 99 %     Weight --      Height --      Head Circumference --      Peak Flow --      Pain Score 11/04/18 1526 8     Pain Loc --      Pain Edu? --      Excl. in [place]? --  No data found.  Updated Vital Signs BP 114/69  (BP Location: Left Arm)   Pulse 76   Temp 98.1 F (36.7 C) (Temporal)   Resp 16   LMP 06/07/2014 (Exact Date) Comment: irregular cycles  SpO2 99%    Physical Exam Constitutional:      General: She is not in acute distress.    Appearance: She is well-developed.  Cardiovascular:     Rate and Rhythm: Normal rate.  Pulmonary:     Effort: Pulmonary effort is normal.  Musculoskeletal:     Right foot: Normal range of motion and normal capillary refill. Tenderness and bony tenderness present. No swelling, crepitus, deformity or laceration.       Feet:     Comments: Tenderness over MTP joints and just proximal to with tenderness; abrasion over pinky toe of right foot; cap refill < 2 seconds ; strong pedal pulse   Skin:    General: Skin is warm and dry.  Neurological:     Mental Status: She is alert and oriented to person, place, and time.      UC Treatments / Results  Labs (all labs ordered are listed, but only abnormal results are displayed) Labs Reviewed - No data to display  EKG   Radiology Dg Foot Complete Right  Result Date: 11/04/2018 CLINICAL DATA:  Blunt injury with pain. EXAM: RIGHT FOOT COMPLETE - 3+ VIEW COMPARISON:  None. FINDINGS: There is no evidence of fracture or dislocation. There is no evidence of arthropathy or other focal bone abnormality. Soft tissues are unremarkable. IMPRESSION: Negative. Electronically Signed   By: Fidela Salisbury M.D.   On: 11/04/2018 16:38    Procedures Procedures (including critical care time)  Medications Ordered in UC Medications - No data to display  Initial Impression / Assessment and Plan / UC Course  I have reviewed the triage vital signs and the nursing notes.  Pertinent labs & imaging results that were available during my care of the patient were reviewed by me and considered in my medical decision making (see chart for details).     Xray today without acute findings. Wound care discussed for abrasion. Pain  management discussed for contusion. If symptoms worsen or do not improve in the next week to return to be seen or to follow up with PCP.  Patient verbalized understanding and agreeable to plan.  Ambulatory out of clinic without difficulty.     Final Clinical Impressions(s) / UC Diagnoses   Final diagnoses:  Contusion of right foot, initial encounter     Discharge Instructions     No fractures on xray, pain from bruising from dropping object onto the foot.  Wear supportive shoes.  Cleanse toe and wound daily, place bandage on it to keep it protected.  Ice and elevate the foot.  Use of ibuprofen to help with pain.  Follow up with your primary care provider as needed for persistent symptoms.     ED Prescriptions    None     PDMP not reviewed this encounter.   Zigmund Gottron, NP 11/04/18 1659

## 2018-11-05 ENCOUNTER — Ambulatory Visit (INDEPENDENT_AMBULATORY_CARE_PROVIDER_SITE_OTHER): Payer: Medicare Other | Admitting: Family Medicine

## 2018-11-05 ENCOUNTER — Encounter: Payer: Self-pay | Admitting: Family Medicine

## 2018-11-05 VITALS — BP 97/66 | HR 84 | Temp 98.9°F | Ht 63.0 in | Wt 186.0 lb

## 2018-11-05 DIAGNOSIS — S90221D Contusion of right lesser toe(s) with damage to nail, subsequent encounter: Secondary | ICD-10-CM | POA: Diagnosis not present

## 2018-11-05 DIAGNOSIS — M79674 Pain in right toe(s): Secondary | ICD-10-CM | POA: Diagnosis not present

## 2018-11-05 MED ORDER — IBUPROFEN 800 MG PO TABS: 800.0000 mg | ORAL_TABLET | Freq: Three times a day (TID) | ORAL | 1 refills | Status: DC | PRN

## 2018-11-05 NOTE — Progress Notes (Signed)
10/13/202010:21 AM  Lauren Chapman [birth year], 48 y.o., female [id number]  Chief Complaint  Patient presents with  . Toe Pain    Rt 5th  toe-pt stated dropped hole puncher and went to urgent care yesterday.    HPI:   Patient is a 48 y.o. 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 1 0 0 0 0  Comment - - - - -  Number falls in past yr: 0 0 0 0 -  Comment - - - - -  Injury with Fall? 1 0 0 0 0  Comment fell down steps 6 months ago - - - -  Risk for fall due to : - - - - -  Risk for fall due to: Comment - - - - -  Follow up - - Falls evaluation completed - -     Allergies  Allergen Reactions  . Codeine Anaphylaxis    Anything with codeine    Prior to Admission medications   Medication Sig Start Date End Date Taking? Authorizing Provider  albuterol (PROVENTIL HFA;VENTOLIN HFA) 108 (90 Base) MCG/ACT inhaler Inhale 2 puffs into the lungs every 4 (four) hours as needed for wheezing or shortness of breath. 01/30/18  Yes Olalere, Adewale A, MD  clonazePAM (KLONOPIN) 0.5 MG tablet Take 0.5 mg by mouth 3 (three) times daily.  12/04/17  Yes [provider]  diclofenac  (VOLTAREN) 75 MG EC tablet Take 1 tablet (75 mg total) by mouth 2 (two) times daily. 10/16/18  Yes Robyn Haber, MD  gabapentin (NEURONTIN) 800 MG tablet  07/02/18  Yes [provider]  ibuprofen (ADVIL) 800 MG tablet Take 1 tablet (800 mg total) by mouth every 8 (eight) hours as needed. 09/03/18  Yes Fontaine, Belinda Block, MD  lamoTRIgine (LAMICTAL) 150 MG tablet Take 150 mg by mouth daily.   Yes [provider]  megestrol (MEGACE) 20 MG tablet Take 1 tablet (20 mg total) by mouth daily. 09/03/18  Yes Fontaine, Belinda Block, MD  methocarbamol (ROBAXIN) 500 MG tablet Take 1 tablet (500 mg total) by mouth 2 (two) times daily. 08/14/18  Yes Corum, Rex Kras, MD  mirtazapine (REMERON) 15 MG tablet Take 15 mg by mouth at bedtime.  12/04/17  Yes [provider]  misoprostol (CYTOTEC) 100 MCG tablet Insert tab intravaginally hs night before surgery. 10/02/18  Yes Fontaine, Belinda Block, MD  mupirocin ointment (BACTROBAN) 2 % Apply 1 application topically 2 (two) times daily. 10/16/18  Yes Robyn Haber, MD  naproxen (NAPROSYN) 500  MG tablet Take 1 tablet (500 mg total) by mouth 2 (two) times daily. 07/29/18  Yes Bast, Traci A, NP  ondansetron (ZOFRAN) 4 MG tablet Take 4 mg by mouth every 8 (eight) hours as needed for nausea or vomiting.   Yes [provider]  pantoprazole (PROTONIX) 40 MG tablet Take 1 tablet (40 mg total) by mouth daily. 09/24/18  Yes Rutherford Guys, MD  rizatriptan (MAXALT) 10 MG tablet Take 1 tab at onset of migraine.  May repeat in 2 hrs, if needed.  Max dose: 2 tabs/day. This is a 30 day prescription. 01/18/18  Yes Marcial Pacas, MD  sertraline (ZOLOFT) 100 MG tablet Take 200 mg by mouth daily.    Yes [provider]  tiZANidine (ZANAFLEX) 4 MG tablet TAKE 1 TABLET BY MOUTH EVERY 6 HOURS AS NEEDED FOR MUSCLE SPASMS. 01/18/18  Yes Marcial Pacas, MD  varenicline (CHANTIX) 1 MG tablet Take 1 tablet (1 mg total) by mouth 2 (two) times daily. 07/11/18  Yes Rutherford Guys, MD  VRAYLAR capsule Take 4.5 mg by mouth daily.  04/17/18  Yes [provider]  fluticasone (FLONASE) 50 MCG/ACT nasal spray Place 2 sprays into both nostrils daily. Patient not taking: Reported on 09/26/2018 09/24/18 10/22/18  Rutherford Guys, MD    Past Medical History:  Diagnosis Date  . Anxiety   . Bipolar affective psychosis ([place])   . Depression   . Emphysema lung ([place])   . GERD (gastroesophageal reflux disease)   . Migraine     Past Surgical History:  Procedure Laterality Date  . CARPAL TUNNEL RELEASE  2009  . CHOLECYSTECTOMY  1997  . GANGLION CYST EXCISION     RIGHT wrist  . KNEE ARTHROSCOPY WITH ANTERIOR CRUCIATE LIGAMENT (ACL) REPAIR Right 10/2015    Social History   Tobacco Use  . Smoking status: Former Smoker    Packs/day: 0.50    Years: 33.00    Pack years: 16.50    Types: Cigarettes    Start date: 10/10/1981  . Smokeless tobacco: Never Used  Substance Use Topics  . Alcohol use: Never    Alcohol/week: 0.0 standard drinks    Frequency: Never    Family History  Problem Relation Age of Onset  . Depression Mother   . Cancer Mother        Cervical  . Alcohol abuse Father   . Crohn's disease Sister   . Stroke Brother 27  . Migraines Daughter   . GER disease Daughter   . Depression Daughter   . Migraines Daughter   . GER disease Daughter     ROS Per hpi  OBJECTIVE:  Today's Vitals   11/05/18 1014 11/05/18 1016  BP: 94/67 97/66  Pulse: 84   Temp: 98.9 F (37.2 C)   SpO2: 97%   Weight: 186 lb (84.4 kg)   Height: 5\' 3"  (1.6 m)    Body mass index is 32.95 kg/m.   Physical Exam   Gen: AAOx3, NAD Right foot: 5th toe with minimal erythema, swelling and abrasion over IP. FROM. No bleeding, drainage, warmth.  No results found for this or any previous visit (from the past 24 hour(s)).  Dg Foot Complete Right  Result Date: 11/04/2018 CLINICAL DATA:  Blunt injury with pain. EXAM: RIGHT FOOT COMPLETE - 3+ VIEW COMPARISON:  None.  FINDINGS: There is no evidence of fracture or dislocation. There is no evidence of arthropathy or other focal bone abnormality. Soft tissues are unremarkable. IMPRESSION: Negative.  Electronically Signed   By: Fidela Salisbury M.D.   On: 11/04/2018 16:38     ASSESSMENT and PLAN  1. Pain of toe of right foot 2. Contusion of lesser toe of right foot with damage to nail, subsequent encounter No fracture, can buddy tape for comfort. Cont RICE therapy. ibu prn pain. Able to return to work wo restrictions.   Other orders - ibuprofen (ADVIL) 800 MG tablet; Take 1 tablet (800 mg total) by mouth every 8 (eight) hours as needed.  Return if symptoms worsen or fail to improve.    Rutherford Guys, MD [facility] at [facility] [place], [place] [address] Ph.  [phone] Fax [phone]

## 2018-11-07 ENCOUNTER — Encounter: Payer: Self-pay | Admitting: Gynecology

## 2018-11-07 ENCOUNTER — Ambulatory Visit (INDEPENDENT_AMBULATORY_CARE_PROVIDER_SITE_OTHER): Payer: Medicare Other | Admitting: Gynecology

## 2018-11-07 ENCOUNTER — Other Ambulatory Visit: Payer: Self-pay

## 2018-11-07 VITALS — BP 118/76

## 2018-11-07 DIAGNOSIS — N840 Polyp of corpus uteri: Secondary | ICD-10-CM

## 2018-11-07 DIAGNOSIS — F250 Schizoaffective disorder, bipolar type: Secondary | ICD-10-CM | POA: Diagnosis not present

## 2018-11-07 DIAGNOSIS — N924 Excessive bleeding in the premenopausal period: Secondary | ICD-10-CM | POA: Diagnosis not present

## 2018-11-07 MED ORDER — MISOPROSTOL 100 MCG PO TABS: ORAL_TABLET | ORAL | 0 refills | Status: DC

## 2018-11-07 NOTE — Progress Notes (Signed)
    Lauren Chapman 10/29/1970 [id number]        48 y.o.  [id number] presents for her preoperative visit for upcoming hysteroscopy D&C.  She has a history of amenorrhea for several years and then heavy bleeding a year ago.  Evaluation showed a negative sonohysterogram and negative endometrial biopsy.  FSH was 32 at that time.  She has continued to bleed on and off being treated with Aygestin and Megace.  Repeat sonohysterogram showed a 4 mm endometrial polyp right lateral wall of the endometrial cavity.  Endometrial biopsy was consistent with an endometrial polyp.  Past medical history,surgical history, problem list, medications, allergies, family history and social history were all reviewed and documented in the EPIC chart.  Directed ROS with pertinent positives and negatives documented in the history of present illness/assessment and plan.  Exam: Caryn Bee assistant Vitals:   11/07/18 1036  BP: 118/76   General appearance:  Normal HEENT normal Lungs clear Cardiac regular rate no rubs murmurs or gallops Abdomen soft nontender without masses guarding rebound Pelvic external BUS vagina normal.  Cervix normal.  Uterus grossly normal size midline mobile nontender.  Adnexa without masses or tenderness.  Assessment/Plan:  48 y.o. [id number] with several years of amenorrhea followed by heavy irregular bleeding refractile to progestins.  Sonohysterogram consistent with small endometrial polyp.  Endometrial biopsy consistent with endometrial polyp.  Patient for hysteroscopy D&C.  I reviewed the proposed surgery with the patient to include the expected intraoperative and postoperative courses as well as the recovery period. The use of the hysteroscope, resectoscope and the D&C portion were all discussed. The risks of surgery to include infection, prolonged antibiotics, hemorrhage necessitating transfusion and the risks of transfusion, including transfusion reaction, hepatitis, HIV, mad cow disease and other  unknown entities were all discussed understood and accepted. The risk of damage to internal organs during the procedure, either immediately recognized or delay recognized, including vagina, cervix, uterus, possible perforation causing damage to bowel, bladder, ureters, vessels and nerves necessitating major exploratory reparative surgery and future reparative surgeries including bladder repair, ureteral damage repair, bowel resection, ostomy formation was also discussed understood and accepted. The patient's questions were answered to her satisfaction and she is ready to proceed with surgery.  A prescription for Cytotec 100 mcg intravaginal night before was provided.   Anastasio Auerbach MD, 10:56 AM 11/07/2018

## 2018-11-07 NOTE — Patient Instructions (Signed)
Followup for surgery as scheduled. 

## 2018-11-07 NOTE — H&P (Signed)
Lauren Chapman [birth year] [id number]   History and Physical  Chief complaint: Irregular perimenopausal bleeding, endometrial polyp   History of present illness: 48 y.o. [id number] with  a history of amenorrhea for several years and then heavy bleeding a year ago.  Evaluation showed a negative sonohysterogram and negative endometrial biopsy.  FSH was 32 at that time.  She has continued to bleed on and off being treated with Aygestin and Megace.  Repeat sonohysterogram showed a 4 mm endometrial polyp right lateral wall of the endometrial cavity.  Endometrial biopsy was consistent with an endometrial polyp.  Patient is admitted for hysteroscopy D&C resection of her endometrial polyp  Past Medical History:  Diagnosis Date  . Anxiety   . Bipolar affective psychosis ([place])   . Depression   . Emphysema lung ([place])   . GERD (gastroesophageal reflux disease)   . Migraine     Past Surgical History:  Procedure Laterality Date  . CARPAL TUNNEL RELEASE  2009  . CHOLECYSTECTOMY  1997  . GANGLION CYST EXCISION     RIGHT wrist  . KNEE ARTHROSCOPY WITH ANTERIOR CRUCIATE LIGAMENT (ACL) REPAIR Right 10/2015    Family History  Problem Relation Age of Onset  . Depression Mother   . Cancer Mother        Cervical  . Alcohol abuse Father   . Crohn's disease Sister   . Stroke Brother 33  . Migraines Daughter   . GER disease Daughter   . Depression Daughter   . Migraines Daughter   . GER disease Daughter     Social History:  reports that she has quit smoking. Her smoking use included cigarettes. She started smoking about 37 years ago. She has a 16.50 pack-year smoking history. She has never used smokeless tobacco. She reports that she does not drink alcohol or use drugs.  Allergies:  Allergies  Allergen Reactions  . Codeine Anaphylaxis    Anything with codeine    Medications: See Epic for the most current list of medications.  ROS:  Was performed and pertinent positives and negatives are  included in the history of present illness.  Exam:   Caryn Bee assistant Vitals:   11/07/18 1036  BP: 118/76   General appearance:  Normal HEENT normal Lungs clear Cardiac regular rate no rubs murmurs or gallops Abdomen soft nontender without masses guarding rebound Pelvic external BUS vagina normal.  Cervix normal.  Uterus grossly normal size midline mobile nontender.  Adnexa without masses or tenderness.    Assessment/Plan:  48 y.o. [id number] with several years of amenorrhea followed by heavy irregular bleeding refractile to progestins.  Sonohysterogram consistent with small endometrial polyp.  Endometrial biopsy consistent with endometrial polyp.  Patient for hysteroscopy D&C.  I reviewed the proposed surgery with the patient to include the expected intraoperative and postoperative courses as well as the recovery period. The use of the hysteroscope, resectoscope and the D&C portion were all discussed. The risks of surgery to include infection, prolonged antibiotics, hemorrhage necessitating transfusion and the risks of transfusion, including transfusion reaction, hepatitis, HIV, mad cow disease and other unknown entities were all discussed understood and accepted. The risk of damage to internal organs during the procedure, either immediately recognized or delay recognized, including vagina, cervix, uterus, possible perforation causing damage to bowel, bladder, ureters, vessels and nerves necessitating major exploratory reparative surgery and future reparative surgeries including bladder repair, ureteral damage repair, bowel resection, ostomy formation was also discussed understood and accepted. The patient's questions were answered to  her satisfaction and she is ready to proceed with surgery.  A prescription for Cytotec 100 mcg intravaginal night before was provided.    Anastasio Auerbach MD, 11:01 AM 11/07/2018

## 2018-11-09 ENCOUNTER — Other Ambulatory Visit (HOSPITAL_COMMUNITY)
Admission: RE | Admit: 2018-11-09 | Discharge: 2018-11-09 | Disposition: A | Discharge status: Home / Self Care | Payer: Medicare Other | Source: Ambulatory Visit | Attending: Gynecology | Admitting: Gynecology

## 2018-11-09 DIAGNOSIS — Z20828 Contact with and (suspected) exposure to other viral communicable diseases: Secondary | ICD-10-CM | POA: Diagnosis not present

## 2018-11-09 DIAGNOSIS — Z01812 Encounter for preprocedural laboratory examination: Secondary | ICD-10-CM | POA: Insufficient documentation

## 2018-11-10 LAB — NOVEL CORONAVIRUS, NAA (HOSP ORDER, SEND-OUT TO REF LAB; TAT 18-24 HRS): SARS-CoV-2, NAA: NOT DETECTED

## 2018-11-12 ENCOUNTER — Encounter (HOSPITAL_BASED_OUTPATIENT_CLINIC_OR_DEPARTMENT_OTHER): Payer: Self-pay | Admitting: *Deleted

## 2018-11-12 ENCOUNTER — Other Ambulatory Visit: Payer: Self-pay

## 2018-11-12 NOTE — Progress Notes (Signed)
Spoke w/ via phone for pre-op interview--- PT Lab needs dos---- CBC, CMP, serum preg COVID test ------ 11-09-2018 Arrive at ------- 0930 NPO after ------ MN Medications to take morning of surgery ----- Klonopin, Gabapentin, Lamictal, Zoloft, Protonix Diabetic medication ----- n/a Patient Special Instructions ----- n/a Pre-Op special Istructions ----- n/a Patient verbalized understanding of instructions that were given at this phone interview. Patient denies shortness of breath, chest pain, fever, cough a this phone interview.

## 2018-11-13 ENCOUNTER — Other Ambulatory Visit: Payer: Self-pay

## 2018-11-13 ENCOUNTER — Ambulatory Visit (HOSPITAL_BASED_OUTPATIENT_CLINIC_OR_DEPARTMENT_OTHER)
Admission: RE | Admit: 2018-11-13 | Discharge: 2018-11-13 | Disposition: A | Discharge status: Home / Self Care | Payer: Medicare Other | Attending: Gynecology | Admitting: Gynecology

## 2018-11-13 ENCOUNTER — Encounter (HOSPITAL_BASED_OUTPATIENT_CLINIC_OR_DEPARTMENT_OTHER)
Admission: RE | Disposition: A | Discharge status: Home / Self Care | Payer: Self-pay | Source: Home / Self Care | Attending: Gynecology

## 2018-11-13 ENCOUNTER — Ambulatory Visit (HOSPITAL_BASED_OUTPATIENT_CLINIC_OR_DEPARTMENT_OTHER): Payer: Medicare Other | Admitting: Anesthesiology

## 2018-11-13 ENCOUNTER — Encounter (HOSPITAL_BASED_OUTPATIENT_CLINIC_OR_DEPARTMENT_OTHER): Payer: Self-pay | Admitting: *Deleted

## 2018-11-13 DIAGNOSIS — N924 Excessive bleeding in the premenopausal period: Secondary | ICD-10-CM | POA: Diagnosis not present

## 2018-11-13 DIAGNOSIS — F319 Bipolar disorder, unspecified: Secondary | ICD-10-CM | POA: Diagnosis not present

## 2018-11-13 DIAGNOSIS — Z87891 Personal history of nicotine dependence: Secondary | ICD-10-CM | POA: Diagnosis not present

## 2018-11-13 DIAGNOSIS — N841 Polyp of cervix uteri: Secondary | ICD-10-CM | POA: Diagnosis not present

## 2018-11-13 DIAGNOSIS — K219 Gastro-esophageal reflux disease without esophagitis: Secondary | ICD-10-CM | POA: Insufficient documentation

## 2018-11-13 DIAGNOSIS — N840 Polyp of corpus uteri: Secondary | ICD-10-CM | POA: Insufficient documentation

## 2018-11-13 DIAGNOSIS — J439 Emphysema, unspecified: Secondary | ICD-10-CM | POA: Insufficient documentation

## 2018-11-13 DIAGNOSIS — Z6833 Body mass index (BMI) 33.0-33.9, adult: Secondary | ICD-10-CM | POA: Diagnosis not present

## 2018-11-13 DIAGNOSIS — E669 Obesity, unspecified: Secondary | ICD-10-CM | POA: Insufficient documentation

## 2018-11-13 HISTORY — DX: Presence of spectacles and contact lenses: Z97.3

## 2018-11-13 HISTORY — PX: DILATATION & CURETTAGE/HYSTEROSCOPY WITH MYOSURE: SHX6511

## 2018-11-13 HISTORY — DX: Stress incontinence (female) (male): N39.3

## 2018-11-13 HISTORY — DX: Simple chronic bronchitis: J41.0

## 2018-11-13 LAB — HCG, SERUM, QUALITATIVE: Preg, Serum: NEGATIVE

## 2018-11-13 LAB — COMPREHENSIVE METABOLIC PANEL
ALT: 19 U/L (ref 0–44)
AST: 19 U/L (ref 15–41)
Albumin: 4.2 g/dL (ref 3.5–5.0)
Alkaline Phosphatase: 58 U/L (ref 38–126)
Anion gap: 10 (ref 5–15)
BUN: 10 mg/dL (ref 6–20)
CO2: 23 mmol/L (ref 22–32)
Calcium: 9 mg/dL (ref 8.9–10.3)
Chloride: 103 mmol/L (ref 98–111)
Creatinine, Ser: 1.02 mg/dL — ABNORMAL HIGH (ref 0.44–1.00)
GFR calc Af Amer: >60 mL/min (ref >60)
GFR calc non Af Amer: >60 mL/min (ref >60)
Glucose, Bld: 97 mg/dL (ref 70–99)
Potassium: 4.3 mmol/L (ref 3.5–5.1)
Sodium: 136 mmol/L (ref 135–145)
Total Bilirubin: 0.8 mg/dL (ref 0.3–1.2)
Total Protein: 7.2 g/dL (ref 6.5–8.1)

## 2018-11-13 LAB — CBC
HCT: 43.3 % (ref 36.0–46.0)
Hemoglobin: 14.7 g/dL (ref 12.0–15.0)
MCH: 31.6 pg (ref 26.0–34.0)
MCHC: 33.9 g/dL (ref 30.0–36.0)
MCV: 93.1 fL (ref 80.0–100.0)
Platelets: 190 10*3/uL (ref 150–400)
RBC: 4.65 MIL/uL (ref 3.87–5.11)
RDW: 13.8 % (ref 11.5–15.5)
WBC: 7.1 10*3/uL (ref 4.0–10.5)
nRBC: 0 % (ref 0.0–0.2)

## 2018-11-13 SURGERY — DILATATION & CURETTAGE/HYSTEROSCOPY WITH MYOSURE
Anesthesia: General | Site: Vagina

## 2018-11-13 MED ORDER — SODIUM CHLORIDE 0.9 % IR SOLN
Status: DC | PRN
  Administered 2018-11-13: 3000 mL

## 2018-11-13 MED ORDER — FENTANYL CITRATE (PF) 100 MCG/2ML IJ SOLN
INTRAMUSCULAR | Status: DC | PRN
  Administered 2018-11-13: 50 ug via INTRAVENOUS

## 2018-11-13 MED ORDER — SODIUM CHLORIDE 0.9 % IV SOLN
2.0000 g | INTRAVENOUS | Status: AC
  Administered 2018-11-13: 2 g via INTRAVENOUS
  Filled 2018-11-13: qty 2

## 2018-11-13 MED ORDER — PROPOFOL 10 MG/ML IV BOLUS
INTRAVENOUS | Status: DC | PRN
  Administered 2018-11-13: 150 mg via INTRAVENOUS

## 2018-11-13 MED ORDER — LIDOCAINE 2% (20 MG/ML) 5 ML SYRINGE
INTRAMUSCULAR | Status: AC
  Filled 2018-11-13: qty 5

## 2018-11-13 MED ORDER — OXYCODONE HCL 5 MG PO TABS
5.0000 mg | ORAL_TABLET | Freq: Once | ORAL | Status: DC | PRN
  Filled 2018-11-13: qty 1

## 2018-11-13 MED ORDER — KETOROLAC TROMETHAMINE 30 MG/ML IJ SOLN
INTRAMUSCULAR | Status: DC | PRN
  Administered 2018-11-13: 30 mg via INTRAVENOUS

## 2018-11-13 MED ORDER — MIDAZOLAM HCL 2 MG/2ML IJ SOLN
INTRAMUSCULAR | Status: DC | PRN
  Administered 2018-11-13: 2 mg via INTRAVENOUS

## 2018-11-13 MED ORDER — ONDANSETRON HCL 4 MG/2ML IJ SOLN
INTRAMUSCULAR | Status: DC | PRN
  Administered 2018-11-13: 4 mg via INTRAVENOUS

## 2018-11-13 MED ORDER — DEXAMETHASONE SODIUM PHOSPHATE 10 MG/ML IJ SOLN
INTRAMUSCULAR | Status: DC | PRN
  Administered 2018-11-13: 5 mg via INTRAVENOUS

## 2018-11-13 MED ORDER — LIDOCAINE 2% (20 MG/ML) 5 ML SYRINGE
INTRAMUSCULAR | Status: DC | PRN
  Administered 2018-11-13: 60 mg via INTRAVENOUS

## 2018-11-13 MED ORDER — FENTANYL CITRATE (PF) 100 MCG/2ML IJ SOLN
25.0000 ug | INTRAMUSCULAR | Status: DC | PRN
  Filled 2018-11-13: qty 1

## 2018-11-13 MED ORDER — ALBUMIN HUMAN 5 % IV SOLN
INTRAVENOUS | Status: AC
  Filled 2018-11-13: qty 250

## 2018-11-13 MED ORDER — LIDOCAINE HCL 1 % IJ SOLN
INTRAMUSCULAR | Status: DC | PRN
  Administered 2018-11-13: 10 mL

## 2018-11-13 MED ORDER — PROPOFOL 10 MG/ML IV BOLUS
INTRAVENOUS | Status: AC
  Filled 2018-11-13: qty 20

## 2018-11-13 MED ORDER — ONDANSETRON HCL 4 MG/2ML IJ SOLN
4.0000 mg | Freq: Once | INTRAMUSCULAR | Status: DC | PRN
  Filled 2018-11-13: qty 2

## 2018-11-13 MED ORDER — LACTATED RINGERS IV SOLN
INTRAVENOUS | Status: DC
  Administered 2018-11-13: 10:00:00 via INTRAVENOUS
  Filled 2018-11-13: qty 1000

## 2018-11-13 MED ORDER — FENTANYL CITRATE (PF) 100 MCG/2ML IJ SOLN
INTRAMUSCULAR | Status: AC
  Filled 2018-11-13: qty 2

## 2018-11-13 MED ORDER — OXYCODONE HCL 5 MG/5ML PO SOLN
5.0000 mg | Freq: Once | ORAL | Status: DC | PRN
  Filled 2018-11-13: qty 5

## 2018-11-13 MED ORDER — SODIUM CHLORIDE 0.9 % IV SOLN
INTRAVENOUS | Status: AC
  Filled 2018-11-13: qty 2

## 2018-11-13 MED ORDER — MIDAZOLAM HCL 2 MG/2ML IJ SOLN
INTRAMUSCULAR | Status: AC
  Filled 2018-11-13: qty 2

## 2018-11-13 SURGICAL SUPPLY — 20 items
CANISTER SUCT 3000ML PPV (MISCELLANEOUS) ×2 IMPLANT
CATH ROBINSON RED A/P 16FR (CATHETERS) ×4 IMPLANT
COVER WAND RF STERILE (DRAPES) ×4 IMPLANT
DEVICE MYOSURE LITE (MISCELLANEOUS) IMPLANT
DEVICE MYOSURE REACH (MISCELLANEOUS) ×2 IMPLANT
DILATOR CANAL MILEX (MISCELLANEOUS) IMPLANT
GAUZE 4X4 16PLY RFD (DISPOSABLE) ×4 IMPLANT
GLOVE BIO SURGEON STRL SZ7.5 (GLOVE) ×8 IMPLANT
GOWN STRL REUS W/TWL XL LVL3 (GOWN DISPOSABLE) ×4 IMPLANT
IV NS IRRIG 3000ML ARTHROMATIC (IV SOLUTION) ×4 IMPLANT
KIT PROCEDURE FLUENT (KITS) ×4 IMPLANT
KIT TURNOVER CYSTO (KITS) ×4 IMPLANT
MYOSURE XL FIBROID (MISCELLANEOUS)
PACK VAGINAL MINOR WOMEN LF (CUSTOM PROCEDURE TRAY) ×4 IMPLANT
PAD OB MATERNITY 4.3X12.25 (PERSONAL CARE ITEMS) ×4 IMPLANT
SEAL CERVICAL OMNI LOK (ABLATOR) IMPLANT
SEAL ROD LENS SCOPE MYOSURE (ABLATOR) ×4 IMPLANT
SYSTEM TISS REMOVAL MYOSURE XL (MISCELLANEOUS) IMPLANT
TOWEL OR 17X26 10 PK STRL BLUE (TOWEL DISPOSABLE) ×6 IMPLANT
WATER STERILE IRR 500ML POUR (IV SOLUTION) IMPLANT

## 2018-11-13 NOTE — H&P (Signed)
The patient was examined.  I reviewed the proposed surgery and consent form with the patient.  The dictated history and physical is current and accurate and all questions were answered. The patient is ready to proceed with surgery and has a realistic understanding and expectation for the outcome.   Anastasio Auerbach MD, 10:12 AM 11/13/2018

## 2018-11-13 NOTE — Transfer of Care (Signed)
Immediate Anesthesia Transfer of Care Note  Patient: Lauren Chapman  Procedure(s) Performed: DILATATION & CURETTAGE/HYSTEROSCOPY WITH MYOSURE (N/A Vagina )  Patient Location: PACU  Anesthesia Type:General  Level of Consciousness: awake, alert , oriented and patient cooperative  Airway & Oxygen Therapy: Patient Spontanous Breathing and Patient connected to nasal cannula oxygen  Post-op Assessment: Report given to RN and Post -op Vital signs reviewed and stable  Post vital signs: Reviewed and stable  Last Vitals:  Vitals Value Taken Time  BP    Temp    Pulse 74 11/13/18 1110  Resp    SpO2 100 % 11/13/18 1110  Vitals shown include unvalidated device data.  Last Pain:  Vitals:   11/13/18 1002  TempSrc: Oral  PainSc: 2       Patients Stated Pain Goal: 6 ([id number])  Complications: No apparent anesthesia complications

## 2018-11-13 NOTE — Anesthesia Procedure Notes (Signed)
Procedure Name: LMA Insertion Date/Time: 11/13/2018 10:38 AM Performed by: Wanita Chamberlain, CRNA Pre-anesthesia Checklist: Patient identified, Emergency Drugs available, Patient being monitored and Suction available Patient Re-evaluated:Patient Re-evaluated prior to induction Oxygen Delivery Method: Circle system utilized Preoxygenation: Pre-oxygenation with 100% oxygen Induction Type: IV induction Ventilation: Mask ventilation without difficulty LMA: LMA inserted LMA Size: 4.0 Number of attempts: 1 Airway Equipment and Method: Bite block

## 2018-11-13 NOTE — Anesthesia Preprocedure Evaluation (Addendum)
Anesthesia Evaluation  Patient identified by MRN, date of birth, ID band Patient awake    Reviewed: Allergy & Precautions, NPO status , Patient's Chart, lab work & pertinent test results  History of Anesthesia Complications Negative for: history of anesthetic complications  Airway Mallampati: II  TM Distance: >3 FB Neck ROM: Full    Dental  (+) Dental Advisory Given, Poor Dentition   Pulmonary COPD,  COPD inhaler, Current Smoker and Patient abstained from smoking.,    Pulmonary exam normal        Cardiovascular negative cardio ROS Normal cardiovascular exam     Neuro/Psych  Headaches, PSYCHIATRIC DISORDERS Anxiety Depression Bipolar Disorder    GI/Hepatic Neg liver ROS, GERD  Medicated and Controlled,  Endo/Other   Obesity   Renal/GU negative Renal ROS  Female GU complaint     Musculoskeletal negative musculoskeletal ROS (+)   Abdominal   Peds  Hematology negative hematology ROS (+)   Anesthesia Other Findings   Reproductive/Obstetrics                            Anesthesia Physical Anesthesia Plan  ASA: III  Anesthesia Plan: General   Post-op Pain Management:    Induction: Intravenous  PONV Risk Score and Plan: 3 and Treatment may vary due to age or medical condition, Ondansetron and Midazolam  Airway Management Planned: LMA  Additional Equipment: None  Intra-op Plan:   Post-operative Plan: Extubation in OR  Informed Consent: I have reviewed the patients History and Physical, chart, labs and discussed the procedure including the risks, benefits and alternatives for the proposed anesthesia with the patient or authorized representative who has indicated his/her understanding and acceptance.     Dental advisory given  Plan Discussed with: CRNA and Anesthesiologist  Anesthesia Plan Comments:        Anesthesia Quick Evaluation

## 2018-11-13 NOTE — Op Note (Signed)
Lauren Chapman September 04, 1970 [id number]   Post Operative Note   Date of surgery:  11/13/2018  Pre Op Dx: Irregular perimenopausal bleeding, endometrial polyp  Post Op Dx: Irregular perimenopausal bleeding, endometrial polyp  Procedure: Hysteroscopy, D&C, MyoSure resection endometrial polyp  Surgeon:  Belinda Block Kida Digiulio  Anesthesia:  General  EBL: 5 cc  Distended media discrepancy: [id number] cc saline  Complications:  None  Specimen: #1 endometrial polyp #2 endometrial curetting to pathology  Findings: EUA: External BUS vagina normal.  Cervix normal.  Uterus grossly normal size midline mobile.  Adnexa without masses   Hysteroscopy: Adequate noting fundus, anterior/posterior endometrial surfaces, right/left tubal ostia, lower uterine segment and endocervical canal all visualized.  Several small polypoid areas noted and resected.  Procedure:  The patient was taken to the operating room, was placed in the low dorsal lithotomy position, underwent general anesthesia, received a perineal/vaginal preparation per nursing personnel and the bladder was emptied with an in and out Foley catheterization. The timeout was performed by the surgical team. An EUA was performed. The patient was draped in the usual fashion. The cervix was visualized with a speculum, anterior lip grasped with a single-tooth tenaculum and a paracervical block was placed using 10 cc's of 1% lidocaine. The cervix was gently dilated to admit the Myosure hysteroscope and hysteroscopy was performed with findings noted above. Using the Myosure Reach resectoscopic wand the polyps were resected in their entirety to the level the surrounding endometrium. A gentle sharp curettage was performed. Both specimens were sent separately to pathology.  Repeat hysteroscopy showed an empty cavity with good distention and no evidence of perforation. The instruments were removed and adequate hemostasis was visualized at the tenaculum site and external  cervical os.  The specimens were identified for pathology.  The sponge, needle and instrument count were verified correct.  The patient was wanded per protocol.  The patient was given intraoperative Toradol, was awakened without difficulty and was taken to the recovery room in good condition having tolerated the procedure well.    Anastasio Auerbach MD, 11:11 AM 11/13/2018

## 2018-11-13 NOTE — Discharge Instructions (Signed)
° °  Postoperative Instructions Hysteroscopy D & C  Dr. Phineas Real and the nursing staff have discussed postoperative instructions with you.  If you have any questions please ask them before you leave the hospital, or call Dr Elisabeth Most office at [phone].    We would like to emphasize the following instructions:   ? Call the office to make your follow-up appointment as recommended by Dr Phineas Real (usually 1-2 weeks).  ? You were given a prescription, or one was ordered for you at the pharmacy you designated.  Get that prescription filled and take the medication according to instructions.  ? You may eat a regular diet, but slowly until you start having bowel movements.  ? Drink plenty of water daily.  ? Nothing in the vagina (intercourse, douching, objects of any kind) for two weeks.  When reinitiating intercourse, if it is uncomfortable, stop and make an appointment with Dr Phineas Real to be evaluated.  ? No driving for one to two days until the effects of anesthesia has worn off.  No traveling out of town for several days.  ? You may shower, but no baths for one week.  Walking up and down stairs is ok.  No heavy lifting, prolonged standing, repeated bending or any working out until your post op check.  ? Rest frequently, listen to your body and do not push yourself and overdo it.  ? Call if:  o Your pain medication does not seem strong enough. o Worsening pain or abdominal bloating o Persistent nausea or vomiting o Difficulty with urination or bowel movements. o Temperature of 101 degrees or higher. o Heavy vaginal bleeding.  If your period is due, you may use tampons. o You have any questions or concerns    Post Anesthesia Home Care Instructions  Activity: Get plenty of rest for the remainder of the day. A responsible individual must stay with you for 24 hours following the procedure.  For the next 24 hours, DO NOT: -Drive a car -Paediatric nurse -Drink alcoholic  beverages -Take any medication unless instructed by your physician -Make any legal decisions or sign important papers.  Meals: Start with liquid foods such as gelatin or soup. Progress to regular foods as tolerated. Avoid greasy, spicy, heavy foods. If nausea and/or vomiting occur, drink only clear liquids until the nausea and/or vomiting subsides. Call your physician if vomiting continues.  Special Instructions/Symptoms: Your throat may feel dry or sore from the anesthesia or the breathing tube placed in your throat during surgery. If this causes discomfort, gargle with warm salt water. The discomfort should disappear within 24 hours.

## 2018-11-14 ENCOUNTER — Encounter (HOSPITAL_BASED_OUTPATIENT_CLINIC_OR_DEPARTMENT_OTHER): Payer: Self-pay | Admitting: Gynecology

## 2018-11-14 ENCOUNTER — Encounter: Payer: Self-pay | Admitting: Gynecology

## 2018-11-14 LAB — SURGICAL PATHOLOGY

## 2018-11-14 NOTE — Anesthesia Postprocedure Evaluation (Signed)
Anesthesia Post Note  Patient: CELINES RIZVI  Procedure(s) Performed: DILATATION & CURETTAGE/HYSTEROSCOPY WITH MYOSURE (N/A Vagina )     Patient location during evaluation: PACU Anesthesia Type: General Level of consciousness: awake and alert Pain management: pain level controlled Vital Signs Assessment: post-procedure vital signs reviewed and stable Respiratory status: spontaneous breathing, nonlabored ventilation and respiratory function stable Cardiovascular status: blood pressure returned to baseline and stable Postop Assessment: no apparent nausea or vomiting Anesthetic complications: no    Last Vitals:  Vitals:   11/13/18 1145 11/13/18 1220  BP: (!) 95/55 113/78  Pulse: 72 71  Resp: 15 16  Temp:  36.8 C  SpO2: 95% 100%    Last Pain:  Vitals:   11/14/18 0950  TempSrc:   PainSc: [place]

## 2018-11-28 DIAGNOSIS — F250 Schizoaffective disorder, bipolar type: Secondary | ICD-10-CM | POA: Diagnosis not present

## 2018-11-28 DIAGNOSIS — F39 Unspecified mood [affective] disorder: Secondary | ICD-10-CM | POA: Diagnosis not present

## 2018-12-15 ENCOUNTER — Ambulatory Visit (HOSPITAL_COMMUNITY)
Admission: EM | Admit: 2018-12-15 | Discharge: 2018-12-15 | Disposition: A | Discharge status: Home / Self Care | Payer: Medicare Other | Attending: Urgent Care | Admitting: Urgent Care

## 2018-12-15 ENCOUNTER — Encounter (HOSPITAL_COMMUNITY): Payer: Self-pay

## 2018-12-15 ENCOUNTER — Other Ambulatory Visit: Payer: Self-pay

## 2018-12-15 DIAGNOSIS — K047 Periapical abscess without sinus: Secondary | ICD-10-CM | POA: Diagnosis not present

## 2018-12-15 DIAGNOSIS — Z9289 Personal history of other medical treatment: Secondary | ICD-10-CM

## 2018-12-15 DIAGNOSIS — K0889 Other specified disorders of teeth and supporting structures: Secondary | ICD-10-CM

## 2018-12-15 MED ORDER — NAPROXEN 500 MG PO TABS: 500.0000 mg | ORAL_TABLET | Freq: Two times a day (BID) | ORAL | 0 refills | Status: DC

## 2018-12-15 MED ORDER — CLINDAMYCIN HCL 300 MG PO CAPS: 300.0000 mg | ORAL_CAPSULE | Freq: Three times a day (TID) | ORAL | 0 refills | Status: DC

## 2018-12-15 NOTE — ED Triage Notes (Signed)
Pt present left side tooth pain, pt states she had surgery on her molars and now her gums has swelling back up and are painful.

## 2018-12-15 NOTE — ED Provider Notes (Signed)
[facility]   MRN: [id number] DOB: March 09, 1970  Subjective:   Lauren Chapman is a 48 y.o. female presenting for 2-day history of recurrent moderate to severe constant sharp low posterior tooth pain since having a surgery on Wednesday.  She had a tooth extracted.  States that she has been taking ibuprofen consistently as prescribed, was given hydrocodone but she is afraid to take this given her allergy to codeine.  She was not able to reach that dental practice office given that they were closed.  States that she feels feverish.  Feels a bad taste in her mouth as well, difficulty opening jaw.  No current facility-administered medications for this encounter.   Current Outpatient Medications:  .  albuterol (PROVENTIL HFA;VENTOLIN HFA) 108 (90 Base) MCG/ACT inhaler, Inhale 2 puffs into the lungs every 4 (four) hours as needed for wheezing or shortness of breath., Disp: 1 Inhaler, Rfl: 1 .  clonazePAM (KLONOPIN) 0.5 MG tablet, Take 0.5 mg by mouth 3 (three) times daily. , Disp: , Rfl: 2 .  diclofenac (VOLTAREN) 75 MG EC tablet, Take 1 tablet (75 mg total) by mouth 2 (two) times daily. (Patient taking differently: Take 75 mg by mouth 2 (two) times daily as needed. ), Disp: 14 tablet, Rfl: 0 .  gabapentin (NEURONTIN) 800 MG tablet, Take 800 mg by mouth 3 (three) times daily. , Disp: , Rfl:  .  ibuprofen (ADVIL) 800 MG tablet, Take 1 tablet (800 mg total) by mouth every 8 (eight) hours as needed., Disp: 30 tablet, Rfl: 1 .  lamoTRIgine (LAMICTAL) 150 MG tablet, Take 150 mg by mouth 2 (two) times daily. , Disp: , Rfl:  .  methocarbamol (ROBAXIN) 500 MG tablet, Take 1 tablet (500 mg total) by mouth 2 (two) times daily. (Patient taking differently: Take 500 mg by mouth as needed. ), Disp: 20 tablet, Rfl: 0 .  mirtazapine (REMERON) 15 MG tablet, Take 15 mg by mouth at bedtime. , Disp: , Rfl: 2 .  naproxen (NAPROSYN) 500 MG tablet, Take 1 tablet (500 mg total) by mouth 2 (two) times daily. (Patient  taking differently: Take 500 mg by mouth 2 (two) times daily as needed. ), Disp: 30 tablet, Rfl: 0 .  ondansetron (ZOFRAN) 4 MG tablet, Take 4 mg by mouth every 8 (eight) hours as needed for nausea or vomiting., Disp: , Rfl:  .  pantoprazole (PROTONIX) 40 MG tablet, Take 1 tablet (40 mg total) by mouth daily. (Patient taking differently: Take 40 mg by mouth daily. ), Disp: 30 tablet, Rfl: 2 .  rizatriptan (MAXALT) 10 MG tablet, Take 1 tab at onset of migraine.  May repeat in 2 hrs, if needed.  Max dose: 2 tabs/day. This is a 30 day prescription., Disp: 15 tablet, Rfl: 3 .  sertraline (ZOLOFT) 100 MG tablet, Take 200 mg by mouth daily. , Disp: , Rfl:  .  tiZANidine (ZANAFLEX) 4 MG tablet, TAKE 1 TABLET BY MOUTH EVERY 6 HOURS AS NEEDED FOR MUSCLE SPASMS. (Patient taking differently: Take 4 mg by mouth. **MAXIMUM DAILY DOSE = 36 MG**), Disp: 30 tablet, Rfl: 2 .  varenicline (CHANTIX) 1 MG tablet, Take 1 tablet (1 mg total) by mouth 2 (two) times daily., Disp: 60 tablet, Rfl: 3 .  VRAYLAR capsule, Take 4.5 mg by mouth at bedtime. , Disp: , Rfl:    Allergies  Allergen Reactions  . Codeine Anaphylaxis    Anything with codeine    Past Medical History:  Diagnosis Date  . Anxiety   .  Bipolar affective psychosis ([place])   . Depression   . Emphysema lung ([facility])    inhaler prn,  followed by pcp and pulmonologist-- dr Margorie John  . GERD (gastroesophageal reflux disease)   . Migraine   . Smokers' cough ([place])    per pt not productive  . SUI (stress urinary incontinence, female)   . Wears glasses      Past Surgical History:  Procedure Laterality Date  . CARPAL TUNNEL RELEASE Right 09-12-2007   @[facility]    and GANGLION CYST EXCISION  . DILATATION & CURETTAGE/HYSTEROSCOPY WITH MYOSURE N/A 11/13/2018   Procedure: DILATATION & CURETTAGE/HYSTEROSCOPY WITH MYOSURE;  Surgeon: Anastasio Auerbach, MD;  Location: [facility];  Service: Gynecology;  Laterality: N/A;  request 9:00am OR start time in  [facility] block requests one hour  . KNEE ARTHROSCOPY WITH ANTERIOR CRUCIATE LIGAMENT (ACL) REPAIR Right 10/2015  . LAPAROSCOPIC CHOLECYSTECTOMY  1997    Family History  Problem Relation Age of Onset  . Depression Mother   . Cancer Mother        Cervical  . Alcohol abuse Father   . Crohn's disease Sister   . Stroke Brother 39  . Migraines Daughter   . GER disease Daughter   . Depression Daughter   . Migraines Daughter   . GER disease Daughter     Social History   Tobacco Use  . Smoking status: Current Every Day Smoker    Years: 33.00    Types: Cigarettes    Start date: 10/10/1981  . Smokeless tobacco: Never Used  . Tobacco comment: 11-12-2018  per pt down to 4-5 cig per day  Substance Use Topics  . Alcohol use: Never    Alcohol/week: 0.0 standard drinks    Frequency: Never  . Drug use: Never    ROS   Objective:   Vitals: BP (!) 117/47 (BP Location: Right Arm)   Pulse 75   Temp 97.8 F (36.6 C) (Oral)   Resp 16   LMP 06/07/2014 (Exact Date) Comment: irregular cycles  SpO2 98%   Physical Exam Constitutional:      General: She is not in acute distress.    Appearance: Normal appearance. She is well-developed. She is not ill-appearing, toxic-appearing or diaphoretic.  HENT:     Head: Normocephalic and atraumatic.     Nose: Nose normal.     Mouth/Throat:     Mouth: Mucous membranes are moist.     Pharynx: Oropharynx is clear.   Eyes:     General: No scleral icterus.    Extraocular Movements: Extraocular movements intact.     Pupils: Pupils are equal, round, and reactive to light.  Cardiovascular:     Rate and Rhythm: Normal rate.  Pulmonary:     Effort: Pulmonary effort is normal.  Skin:    General: Skin is warm and dry.  Neurological:     General: No focal deficit present.     Mental Status: She is alert and oriented to person, place, and time.  Psychiatric:        Mood and Affect: Mood normal.        Behavior: Behavior normal.       Assessment and Plan :   1. Dental infection   2. Pain, dental   3. History of dental surgery     Will cover for recurrent dental infection with clindamycin for 3 days.  Will defer to dental surgeon for recheck and consideration to extend course.  In the meantime  we will have patient take Naprosyn and encouraged her to try hydrocodone.  In the event she has allergic reaction recommended she take Benadryl and come back to the clinic.  Work note provided to the patient. Counseled patient on potential for adverse effects with medications prescribed/recommended today, ER and return-to-clinic precautions discussed, patient verbalized understanding.    [name], PA-C 12/15/18 1239

## 2018-12-17 DIAGNOSIS — Z1231 Encounter for screening mammogram for malignant neoplasm of breast: Secondary | ICD-10-CM | POA: Diagnosis not present

## 2018-12-17 LAB — HM MAMMOGRAPHY

## 2018-12-26 DIAGNOSIS — F250 Schizoaffective disorder, bipolar type: Secondary | ICD-10-CM | POA: Diagnosis not present

## 2019-01-03 ENCOUNTER — Encounter (HOSPITAL_COMMUNITY): Payer: Self-pay

## 2019-01-03 ENCOUNTER — Ambulatory Visit (HOSPITAL_COMMUNITY)
Admission: EM | Admit: 2019-01-03 | Discharge: 2019-01-03 | Disposition: A | Discharge status: Home / Self Care | Payer: Medicare Other | Attending: Family Medicine | Admitting: Family Medicine

## 2019-01-03 ENCOUNTER — Other Ambulatory Visit: Payer: Self-pay

## 2019-01-03 DIAGNOSIS — R519 Headache, unspecified: Secondary | ICD-10-CM

## 2019-01-03 DIAGNOSIS — B349 Viral infection, unspecified: Secondary | ICD-10-CM | POA: Insufficient documentation

## 2019-01-03 DIAGNOSIS — R5383 Other fatigue: Secondary | ICD-10-CM

## 2019-01-03 DIAGNOSIS — Z20828 Contact with and (suspected) exposure to other viral communicable diseases: Secondary | ICD-10-CM | POA: Diagnosis not present

## 2019-01-03 DIAGNOSIS — J3489 Other specified disorders of nose and nasal sinuses: Secondary | ICD-10-CM | POA: Diagnosis not present

## 2019-01-03 DIAGNOSIS — R197 Diarrhea, unspecified: Secondary | ICD-10-CM

## 2019-01-03 DIAGNOSIS — Z20822 Contact with and (suspected) exposure to covid-19: Secondary | ICD-10-CM

## 2019-01-03 NOTE — Discharge Instructions (Addendum)
Home to rest Drink more fluids Tylenol for pain or fever Quarantine until you get your test results Your result will be available on MyChart Call for problems

## 2019-01-03 NOTE — ED Triage Notes (Signed)
Patient presents to Urgent Care with complaints of diarrhea, headache, and runny nose since yesterday. Patient reports she was possibly exposed to covid 6 days ago.

## 2019-01-03 NOTE — ED Provider Notes (Signed)
[facility]    CSN: [id number] Arrival date & time: 01/03/19  1149      History   Chief Complaint Chief Complaint  Patient presents with  . Appointment    1200  . COVID Exposure    HPI Lauren Chapman is a 48 y.o. female.   HPI  Patient is here concerned about coronavirus.  She states that one of her coworkers told her that his son was positive.  She states that she works at [employer].  She states that she always wears a mask.  She has been good about handwashing and social distancing.  She lives with a 50 year old daughter.  The daughter as well.  Since yesterday she has had severe headache, fatigue, runny nose, and today developed diarrhea.  She states that Tylenol has not helped the headache.  It does not feel like one of her usual migraines.  No loss of taste or smell although she has no appetite.  No coughing or shortness of breath  Past Medical History:  Diagnosis Date  . Anxiety   . Bipolar affective psychosis ([place])   . Depression   . Emphysema lung ([facility])    inhaler prn,  followed by pcp and pulmonologist-- dr Margorie John  . GERD (gastroesophageal reflux disease)   . Migraine   . Smokers' cough ([place])    per pt not productive  . SUI (stress urinary incontinence, female)   . Wears glasses     Patient Active Problem List   Diagnosis Date Noted  . Emphysema, unspecified ([place]) 05/17/2018  . Weakness 01/25/2017  . Chronic migraine 12/22/2014  . Encounter for other general counseling or advice on contraception 09/02/2014  . Bipolar disorder ([place]) 10/12/2011  . Anxiety and depression 10/12/2011  . GERD (gastroesophageal reflux disease) 10/12/2011    Past Surgical History:  Procedure Laterality Date  . CARPAL TUNNEL RELEASE Right 09-12-2007   @[facility]    and GANGLION CYST EXCISION  . DILATATION & CURETTAGE/HYSTEROSCOPY WITH MYOSURE N/A 11/13/2018   Procedure: DILATATION & CURETTAGE/HYSTEROSCOPY WITH MYOSURE;  Surgeon: Anastasio Auerbach, MD;  Location:  [facility];  Service: Gynecology;  Laterality: N/A;  request 9:00am OR start time in [facility] block requests one hour  . KNEE ARTHROSCOPY WITH ANTERIOR CRUCIATE LIGAMENT (ACL) REPAIR Right 10/2015  . LAPAROSCOPIC CHOLECYSTECTOMY  1997    OB History    Gravida  3   Para  2   Term  2   Preterm  0   AB  1   Living  2     SAB  1   TAB  0   Ectopic  0   Multiple  0   Live Births               Home Medications    Prior to Admission medications   Medication Sig Start Date End Date Taking? Authorizing Provider  albuterol (PROVENTIL HFA;VENTOLIN HFA) 108 (90 Base) MCG/ACT inhaler Inhale 2 puffs into the lungs every 4 (four) hours as needed for wheezing or shortness of breath. 01/30/18   Olalere, Cicero Duck A, MD  clonazePAM (KLONOPIN) 0.5 MG tablet Take 0.5 mg by mouth 3 (three) times daily.  12/04/17   [provider]  gabapentin (NEURONTIN) 800 MG tablet Take 800 mg by mouth 3 (three) times daily.  07/02/18   [provider]  lamoTRIgine (LAMICTAL) 150 MG tablet Take 150 mg by mouth 2 (two) times daily.     [provider]  mirtazapine (  REMERON) 15 MG tablet Take 15 mg by mouth at bedtime.  12/04/17   [provider]  ondansetron (ZOFRAN) 4 MG tablet Take 4 mg by mouth every 8 (eight) hours as needed for nausea or vomiting.    [provider]  pantoprazole (PROTONIX) 40 MG tablet Take 1 tablet (40 mg total) by mouth daily. Patient taking differently: Take 40 mg by mouth daily.  09/24/18   Rutherford Guys, MD  rizatriptan (MAXALT) 10 MG tablet Take 1 tab at onset of migraine.  May repeat in 2 hrs, if needed.  Max dose: 2 tabs/day. This is a 30 day prescription. 01/18/18   Marcial Pacas, MD  sertraline (ZOLOFT) 100 MG tablet Take 200 mg by mouth daily.     [provider]  tiZANidine (ZANAFLEX) 4 MG tablet TAKE 1 TABLET BY MOUTH EVERY 6 HOURS AS NEEDED FOR MUSCLE SPASMS. Patient taking differently: Take 4 mg by  mouth. **MAXIMUM DAILY DOSE = 36 MG** 01/18/18   Marcial Pacas, MD  varenicline (CHANTIX) 1 MG tablet Take 1 tablet (1 mg total) by mouth 2 (two) times daily. 07/11/18   Rutherford Guys, MD  VRAYLAR capsule Take 4.5 mg by mouth at bedtime.  04/17/18   [provider]  fluticasone (FLONASE) 50 MCG/ACT nasal spray Place 2 sprays into both nostrils daily. Patient not taking: Reported on 09/26/2018 09/24/18 10/22/18  Rutherford Guys, MD    Family History Family History  Problem Relation Age of Onset  . Depression Mother   . Cancer Mother        Cervical  . Alcohol abuse Father   . Crohn's disease Sister   . Stroke Brother 76  . Migraines Daughter   . GER disease Daughter   . Depression Daughter   . Migraines Daughter   . GER disease Daughter     Social History Social History   Tobacco Use  . Smoking status: Current Every Day Smoker    Packs/day: 0.50    Years: 33.00    Pack years: 16.50    Types: Cigarettes    Start date: 10/10/1981  . Smokeless tobacco: Never Used  . Tobacco comment: 11-12-2018  per pt down to 4-5 cig per day  Substance Use Topics  . Alcohol use: Never    Alcohol/week: 0.0 standard drinks  . Drug use: Never     Allergies   Codeine   Review of Systems Review of Systems  Constitutional: Negative for chills and fever.  HENT: Negative for congestion and hearing loss.   Eyes: Negative for pain.  Respiratory: Negative for cough and shortness of breath.   Cardiovascular: Negative for chest pain and leg swelling.  Gastrointestinal: Negative for abdominal pain, constipation and diarrhea.  Genitourinary: Negative for dysuria and frequency.  Musculoskeletal: Negative for myalgias.  Neurological: Negative for dizziness, seizures and headaches.  Psychiatric/Behavioral: The patient is not nervous/anxious.      Physical Exam Triage Vital Signs ED Triage Vitals  Enc Vitals Group     BP 01/03/19 1208 107/73     Pulse Rate 01/03/19 1208 68     Resp  01/03/19 1208 17     Temp 01/03/19 1208 98.2 F (36.8 C)     Temp Source 01/03/19 1208 Oral     SpO2 01/03/19 1208 100 %     Weight --      Height --      Head Circumference --      Peak Flow --  Pain Score 01/03/19 1205 7     Pain Loc --      Pain Edu? --      Excl. in [place]? --    No data found.  Updated Vital Signs BP 107/73 (BP Location: Left Arm)   Pulse 68   Temp 98.2 F (36.8 C) (Oral)   Resp 17   LMP 06/07/2014 (Exact Date) Comment: irregular cycles  SpO2 100%   Visual Acuity Right Eye Distance:   Left Eye Distance:   Bilateral Distance:    Right Eye Near:   Left Eye Near:    Bilateral Near:     Physical Exam Vitals reviewed.  Constitutional:      General: She is not in acute distress.    Appearance: She is well-developed. She is obese.  HENT:     Head: Normocephalic and atraumatic.     Mouth/Throat:     Comments: Mask in place. Eyes:     Conjunctiva/sclera: Conjunctivae normal.     Pupils: Pupils are equal, round, and reactive to light.  Cardiovascular:     Rate and Rhythm: Normal rate and regular rhythm.  Pulmonary:     Effort: Pulmonary effort is normal. No respiratory distress.     Breath sounds: Normal breath sounds.     Comments: Lungs are clear Abdominal:     General: There is no distension.     Palpations: Abdomen is soft.  Musculoskeletal:        General: Normal range of motion.     Cervical back: Normal range of motion.  Skin:    General: Skin is warm and dry.  Neurological:     Mental Status: She is alert.  Psychiatric:        Mood and Affect: Mood normal.        Behavior: Behavior normal.      UC Treatments / Results  Labs (all labs ordered are listed, but only abnormal results are displayed) Labs Reviewed  NOVEL CORONAVIRUS, NAA (HOSP ORDER, SEND-OUT TO REF LAB; TAT 18-24 HRS)    EKG   Radiology No results found.  Procedures Procedures (including critical care time)  Medications Ordered in UC Medications - No  data to display  Initial Impression / Assessment and Plan / UC Course  I have reviewed the triage vital signs and the nursing notes.  Pertinent labs & imaging results that were available during my care of the patient were reviewed by me and considered in my medical decision making (see chart for details).     Covid testing is done.  Patient will quarantine pending her test results.  Note is given for work.  All questions answered Final Clinical Impressions(s) / UC Diagnoses   Final diagnoses:  Acute viral syndrome  Suspected COVID-19 virus infection     Discharge Instructions     Home to rest Drink more fluids Tylenol for pain or fever Quarantine until you get your test results Your result will be available on MyChart Call for problems   ED Prescriptions    None     PDMP not reviewed this encounter.   Raylene Everts, MD 01/03/19 [phone]

## 2019-01-05 LAB — NOVEL CORONAVIRUS, NAA (HOSP ORDER, SEND-OUT TO REF LAB; TAT 18-24 HRS): SARS-CoV-2, NAA: NOT DETECTED

## 2019-01-07 ENCOUNTER — Ambulatory Visit (INDEPENDENT_AMBULATORY_CARE_PROVIDER_SITE_OTHER)
Admission: RE | Admit: 2019-01-07 | Discharge: 2019-01-07 | Disposition: A | Discharge status: Home / Self Care | Payer: Medicare Other | Source: Ambulatory Visit

## 2019-01-07 ENCOUNTER — Other Ambulatory Visit: Payer: Self-pay

## 2019-01-07 DIAGNOSIS — Z03818 Encounter for observation for suspected exposure to other biological agents ruled out: Secondary | ICD-10-CM

## 2019-01-07 DIAGNOSIS — Z7689 Persons encountering health services in other specified circumstances: Secondary | ICD-10-CM | POA: Diagnosis not present

## 2019-01-07 DIAGNOSIS — Z20822 Contact with and (suspected) exposure to covid-19: Secondary | ICD-10-CM

## 2019-01-07 NOTE — ED Provider Notes (Signed)
Virtual Visit via Video Note:  IN [place]  initiated request for Telemedicine visit with [facility] team. I connected with Lauren Chapman  on 01/07/2019 at 5:15 PM  for a synchronized telemedicine visit using a video enabled HIPPA compliant telemedicine application. I verified that I am speaking with Lauren Chapman  using two identifiers. Lauren Hall-Potvin, PA-C  was physically located in a [facility] site and Lauren Chapman was located at a different location.   The limitations of evaluation and management by telemedicine as well as the availability of in-person appointments were discussed. Patient was informed that she  may incur a bill ( including co-pay) for this virtual visit encounter. Lauren Chapman  expressed understanding and gave verbal consent to proceed with virtual visit.     History of Present Illness:Lauren Chapman  is a 48 y.o. female presents with request for letter to be able to return to work.  Patient was seen 01/03/2019: Please see that urgent care visit note which was reviewed by me at time of visit.  Patient underwent Covid testing: Negative.  States that her symptoms (generalized myalgias, headaches, nasal congestion, diarrhea) were completely resolved as of yesterday.  Patient has not had fever for greater than 3 days at this time.  Review of Systems  Constitutional: Negative for fever and malaise/fatigue.  Respiratory: Negative for cough and shortness of breath.   Cardiovascular: Negative for chest pain and palpitations.  Gastrointestinal: Negative for abdominal pain, diarrhea and vomiting.  Musculoskeletal: Negative for joint pain and myalgias.    Past Medical History:  Diagnosis Date  . Anxiety   . Bipolar affective psychosis ([place])   . Depression   . Emphysema lung (HCC)    inhaler prn,  followed by pcp and pulmonologist-- dr Margorie John  . GERD (gastroesophageal reflux disease)   . Migraine   . Smokers' cough ([place])    per  pt not productive  . SUI (stress urinary incontinence, female)   . Wears glasses     Allergies  Allergen Reactions  . Codeine Anaphylaxis    Anything with codeine     Observations/Objective: 48 year old female sitting in no acute distress.  Patient is able to speak in full sentences without coughing, sneezing, wheezing.  Assessment and Plan: Work note provided for patient to be able to return to work given lack of symptoms, fever and negative Covid test.  Follow Up Instructions: Patient to return to work, will seek in-person evaluation for recurrence of any symptoms, fever.   I discussed the assessment and treatment plan with the patient. The patient was provided an opportunity to ask questions and all were answered. The patient agreed with the plan and demonstrated an understanding of the instructions.   The patient was advised to call back or seek an in-person evaluation if the symptoms worsen or if the condition fails to improve as anticipated.  I provided 15 minutes of non-face-to-face time during this encounter.    [employer], PA-C  01/07/2019 5:15 PM        Chapman, [place], PA-C 01/07/19 1715

## 2019-01-07 NOTE — Discharge Instructions (Signed)
I do not see a reason why you may not return to work at this time given lack of symptoms, fever in conjunction with negative Covid test. Be sure to continue wearing masks in public and practicing good hand hygiene.

## 2019-01-08 ENCOUNTER — Telehealth: Payer: Self-pay | Admitting: Neurology

## 2019-01-08 MED ORDER — TIZANIDINE HCL 4 MG PO TABS: ORAL_TABLET | ORAL | 2 refills | Status: DC

## 2019-01-08 MED ORDER — RIZATRIPTAN BENZOATE 10 MG PO TABS: ORAL_TABLET | ORAL | 2 refills | Status: DC

## 2019-01-08 NOTE — Telephone Encounter (Signed)
Pt is requesting a refill of tiZANidine (ZANAFLEX) 4 MG tablet and rizatriptan (MAXALT) 10 MG tablet , to be sent to [place], [address]

## 2019-01-08 NOTE — Telephone Encounter (Signed)
She has pending follow up on 04/03/2019.  Refills provided to this date.

## 2019-02-13 DIAGNOSIS — F250 Schizoaffective disorder, bipolar type: Secondary | ICD-10-CM | POA: Diagnosis not present

## 2019-02-20 ENCOUNTER — Telehealth: Payer: Self-pay | Admitting: *Deleted

## 2019-02-20 ENCOUNTER — Other Ambulatory Visit: Payer: Self-pay | Admitting: Obstetrics and Gynecology

## 2019-02-20 MED ORDER — MEGESTROL ACETATE 20 MG PO TABS: 20.0000 mg | ORAL_TABLET | Freq: Every day | ORAL | 1 refills | Status: DC

## 2019-02-20 NOTE — Telephone Encounter (Signed)
Patient informed with below note. 

## 2019-02-20 NOTE — Telephone Encounter (Signed)
I did send a refill for Megace 20 mg daily x 30 days. If this bleeding persists, or slows then becomes heavy again after the medication is finished, we should have her come to an appt to discuss further management. If any persistent heavy bleeding especially after hours, she would need to go to ER.

## 2019-02-20 NOTE — Telephone Encounter (Signed)
Left message for patient to call.

## 2019-02-20 NOTE — Telephone Encounter (Signed)
Patient called post D&C on 11/13/18 with Dr. Phineas Real. Patient called today to report her bleeding has returned, spotting started this week on Tuesday and yesterday she noticed increase in flow. Patient said last night bleeding began heavy, changed pads several times and during the night as well. I did offer an office, however she has 2 appointment with other providers today. She has taken megace and aygestin in past to help with bleeding, called today asking if Rx could be sent to pharmacy to decrease flow? Please advise

## 2019-02-24 HISTORY — PX: DILATION AND CURETTAGE OF UTERUS: SHX78

## 2019-03-06 DIAGNOSIS — F250 Schizoaffective disorder, bipolar type: Secondary | ICD-10-CM | POA: Diagnosis not present

## 2019-03-10 ENCOUNTER — Telehealth: Payer: Self-pay | Admitting: *Deleted

## 2019-03-10 NOTE — Telephone Encounter (Signed)
Schedule AWV.  

## 2019-03-17 ENCOUNTER — Telehealth: Payer: Self-pay | Admitting: *Deleted

## 2019-03-17 ENCOUNTER — Ambulatory Visit (INDEPENDENT_AMBULATORY_CARE_PROVIDER_SITE_OTHER): Payer: Medicare Other | Admitting: Family Medicine

## 2019-03-17 ENCOUNTER — Other Ambulatory Visit: Payer: Self-pay | Admitting: *Deleted

## 2019-03-17 VITALS — BP 107/73 | Ht 63.0 in | Wt 158.0 lb

## 2019-03-17 DIAGNOSIS — Z1211 Encounter for screening for malignant neoplasm of colon: Secondary | ICD-10-CM

## 2019-03-17 DIAGNOSIS — Z0000 Encounter for general adult medical examination without abnormal findings: Secondary | ICD-10-CM

## 2019-03-17 NOTE — Patient Instructions (Addendum)
Thank you for taking time to come for your Medicare Wellness Visit. I appreciate your ongoing commitment to your health goals. Please review the following plan we discussed and let me know if I can assist you in the future.  Leroy Kennedy LPN  Preventive Care 54-51 Years Old, Female Preventive care refers to visits with your health care provider and lifestyle choices that can promote health and wellness. This includes:  A yearly physical exam. This may also be called an annual well check.  Regular dental visits and eye exams.  Immunizations.  Screening for certain conditions.  Healthy lifestyle choices, such as eating a healthy diet, getting regular exercise, not using drugs or products that contain nicotine and tobacco, and limiting alcohol use. What can I expect for my preventive care visit? Physical exam Your health care provider will check your:  Height and weight. This may be used to calculate body mass index (BMI), which tells if you are at a healthy weight.  Heart rate and blood pressure.  Skin for abnormal spots. Counseling Your health care provider may ask you questions about your:  Alcohol, tobacco, and drug use.  Emotional well-being.  Home and relationship well-being.  Sexual activity.  Eating habits.  Work and work Statistician.  Method of birth control.  Menstrual cycle.  Pregnancy history. What immunizations do I need?  Influenza (flu) vaccine  This is recommended every year. Tetanus, diphtheria, and pertussis (Tdap) vaccine  You may need a Td booster every 10 years. Varicella (chickenpox) vaccine  You may need this if you have not been vaccinated. Zoster (shingles) vaccine  You may need this after age 51. Measles, mumps, and rubella (MMR) vaccine  You may need at least one dose of MMR if you were born in 1957 or later. You may also need a second dose. Pneumococcal conjugate (PCV13) vaccine  You may need this if you have certain conditions  and were not previously vaccinated. Pneumococcal polysaccharide (PPSV23) vaccine  You may need one or two doses if you smoke cigarettes or if you have certain conditions. Meningococcal conjugate (MenACWY) vaccine  You may need this if you have certain conditions. Hepatitis A vaccine  You may need this if you have certain conditions or if you travel or work in places where you may be exposed to hepatitis A. Hepatitis B vaccine  You may need this if you have certain conditions or if you travel or work in places where you may be exposed to hepatitis B. Haemophilus influenzae type b (Hib) vaccine  You may need this if you have certain conditions. Human papillomavirus (HPV) vaccine  If recommended by your health care provider, you may need three doses over 6 months. You may receive vaccines as individual doses or as more than one vaccine together in one shot (combination vaccines). Talk with your health care provider about the risks and benefits of combination vaccines. What tests do I need? Blood tests  Lipid and cholesterol levels. These may be checked every 5 years, or more frequently if you are over 51 years old.  Hepatitis C test.  Hepatitis B test. Screening  Lung cancer screening. You may have this screening every year starting at age 51 if you have a 30-pack-year history of smoking and currently smoke or have quit within the past 15 years.  Colorectal cancer screening. All adults should have this screening starting at age 51 and continuing until age 51. Your health care provider may recommend screening at age 51 if you are  at increased risk. You will have tests every 1-10 years, depending on your results and the type of screening test.  Diabetes screening. This is done by checking your blood sugar (glucose) after you have not eaten for a while (fasting). You may have this done every 1-3 years.  Mammogram. This may be done every 1-2 years. Talk with your health care provider  about when you should start having regular mammograms. This may depend on whether you have a family history of breast cancer.  BRCA-related cancer screening. This may be done if you have a family history of breast, ovarian, tubal, or peritoneal cancers.  Pelvic exam and Pap test. This may be done every 3 years starting at age 28. Starting at age 45, this may be done every 5 years if you have a Pap test in combination with an HPV test. Other tests  Sexually transmitted disease (STD) testing.  Bone density scan. This is done to screen for osteoporosis. You may have this scan if you are at high risk for osteoporosis. Follow these instructions at home: Eating and drinking  Eat a diet that includes fresh fruits and vegetables, whole grains, lean protein, and low-fat dairy.  Take vitamin and mineral supplements as recommended by your health care provider.  Do not drink alcohol if: ? Your health care provider tells you not to drink. ? You are pregnant, may be pregnant, or are planning to become pregnant.  If you drink alcohol: ? Limit how much you have to 0-1 drink a day. ? Be aware of how much alcohol is in your drink. In the [place] one drink equals one 12 oz bottle of beer (355 mL), one 5 oz glass of wine (148 mL), or one 1 oz glass of hard liquor (44 mL). Lifestyle  Take daily care of your teeth and gums.  Stay active. Exercise for at least 30 minutes on 5 or more days each week.  Do not use any products that contain nicotine or tobacco, such as cigarettes, e-cigarettes, and chewing tobacco. If you need help quitting, ask your health care provider.  If you are sexually active, practice safe sex. Use a condom or other form of birth control (contraception) in order to prevent pregnancy and STIs (sexually transmitted infections).  If told by your health care provider, take low-dose aspirin daily starting at age 67. What's next?  Visit your health care provider once a year for a well  check visit.  Ask your health care provider how often you should have your eyes and teeth checked.  Stay up to date on all vaccines. This information is not intended to replace advice given to you by your health care provider. Make sure you discuss any questions you have with your health care provider. Document Revised: 09/20/2017 Document Reviewed: 09/20/2017 Elsevier Patient Education  2020 [employer].

## 2019-03-17 NOTE — Progress Notes (Signed)
Presents today for [employer] Visit   Date of last exam: 11/05/2018  Interpreter used for this visit? No  I connected with  Lauren Chapman on 03/17/19 by a telephone  and verified that I am speaking with the correct person using two identifiers.   I discussed the limitations of evaluation and management by telemedicine. The patient expressed understanding and agreed to proceed.   Patient Care Team: Lauren Guys, MD as PCP - General (Family Medicine) Lauren Arn, MD as Consulting Physician (Physical Medicine and Rehabilitation)   Other items to address today:   Discussed immunizations Cologuard ordered Discussed Eye/Dental    ADVANCE DIRECTIVES: Discussed: YES On File: NO Materials Provided:  YES  Immunization status:  Immunization History  Administered Date(s) Administered  . Influenza Split 10/11/2011  . Influenza,inj,Quad PF,6+ Mos 12/04/2017  . Tdap 10/16/2018     Health Maintenance Due  Topic Date Due  . HIV Screening  04/28/1983  . COLONOSCOPY  04/28/2018     Functional Status Survey: Is the patient deaf or have difficulty hearing?: No Does the patient have difficulty seeing, even when wearing glasses/contacts?: No Does the patient have difficulty concentrating, remembering, or making decisions?: No Does the patient have difficulty walking or climbing stairs?: No Does the patient have difficulty dressing or bathing?: No Does the patient have difficulty doing errands alone such as visiting a doctor's office or shopping?: No   6CIT Screen 03/17/2019  What Year? 0 points  What month? 0 points  What time? 0 points  Count back from 20 0 points  Months in reverse 0 points  Repeat phrase 0 points  Total Score 0        Clinical Support from 03/17/2019 in [facility] [place]  AUDIT-C Score  0       Home Environment:   No trouble climbing stairs Lives in two story home No scattered rugs No grab bars Adequate  lighting/no clutter  Timed warm up  N/A   Patient Active Problem List   Diagnosis Date Noted  . Emphysema, unspecified ([place]) 05/17/2018  . Weakness 01/25/2017  . Chronic migraine 12/22/2014  . Encounter for other general counseling or advice on contraception 09/02/2014  . Bipolar disorder ([place]) 10/12/2011  . Anxiety and depression 10/12/2011  . GERD (gastroesophageal reflux disease) 10/12/2011     Past Medical History:  Diagnosis Date  . Anxiety   . Bipolar affective psychosis ([place])   . Depression   . Emphysema lung ([facility])    inhaler prn,  followed by pcp and pulmonologist-- dr Lauren Chapman  . GERD (gastroesophageal reflux disease)   . Migraine   . Smokers' cough ([place])    per pt not productive  . SUI (stress urinary incontinence, female)   . Wears glasses      Past Surgical History:  Procedure Laterality Date  . CARPAL TUNNEL RELEASE Right 09-12-2007   @[email or web address]    and GANGLION CYST EXCISION  . DILATATION & CURETTAGE/HYSTEROSCOPY WITH MYOSURE N/A 11/13/2018   Procedure: DILATATION & CURETTAGE/HYSTEROSCOPY WITH MYOSURE;  Surgeon: Lauren Auerbach, MD;  Location: [facility];  Service: Gynecology;  Laterality: N/A;  request 9:00am OR start time in [facility] block requests one hour  . KNEE ARTHROSCOPY WITH ANTERIOR CRUCIATE LIGAMENT (ACL) REPAIR Right 10/2015  . LAPAROSCOPIC CHOLECYSTECTOMY  1997     Family History  Problem Relation Age of Onset  . Depression Mother   . Cancer Mother  Cervical  . Alcohol abuse Father   . Crohn's disease Sister   . Stroke Brother 73  . Migraines Daughter   . GER disease Daughter   . Depression Daughter   . Migraines Daughter   . GER disease Daughter      Social History   Socioeconomic History  . Marital status: Divorced    Spouse name: n/a  . Number of children: 2  . Years of education: 73  . Highest education level: Not on file  Occupational History  . Occupation: Disabled    Comment: Formerly a  Pharmacist, hospital.  Tobacco Use  . Smoking status: Current Every Day Smoker    Packs/day: 0.50    Years: 33.00    Pack years: 16.50    Types: Cigarettes    Start date: 10/10/1981  . Smokeless tobacco: Never Used  . Tobacco comment: 11-12-2018  per pt down to 4-5 cig per day  Substance and Sexual Activity  . Alcohol use: Never    Alcohol/week: 0.0 standard drinks  . Drug use: Never  . Sexual activity: Not on file    Comment: 1st intercourse 49 yo-More than 5 partners  Other Topics Concern  . Not on file  Social History Narrative   Lives at home with 1 of her two daughters   Right-handed.   2-4 cups caffeine daily.   Disabled    Social Determinants of Health   Financial Resource Strain:   . Difficulty of Paying Living Expenses: Not on file  Food Insecurity:   . Worried About Charity fundraiser in the Last Year: Not on file  . Ran Out of Food in the Last Year: Not on file  Transportation Needs:   . Lack of Transportation (Medical): Not on file  . Lack of Transportation (Non-Medical): Not on file  Physical Activity:   . Days of Exercise per Week: Not on file  . Minutes of Exercise per Session: Not on file  Stress:   . Feeling of Stress : Not on file  Social Connections:   . Frequency of Communication with Friends and Family: Not on file  . Frequency of Social Gatherings with Friends and Family: Not on file  . Attends Religious Services: Not on file  . Active Member of Clubs or Organizations: Not on file  . Attends Archivist Meetings: Not on file  . Marital Status: Not on file  Intimate Partner Violence:   . Fear of Current or Ex-Partner: Not on file  . Emotionally Abused: Not on file  . Physically Abused: Not on file  . Sexually Abused: Not on file     Allergies  Allergen Reactions  . Codeine Anaphylaxis    Anything with codeine     Prior to Admission medications   Medication Sig Start Date End Date Taking? Authorizing Provider  albuterol (PROVENTIL  HFA;VENTOLIN HFA) 108 (90 Base) MCG/ACT inhaler Inhale 2 puffs into the lungs every 4 (four) hours as needed for wheezing or shortness of breath. 01/30/18  Yes Olalere, Adewale A, MD  clonazePAM (KLONOPIN) 0.5 MG tablet Take 0.5 mg by mouth 3 (three) times daily.  12/04/17  Yes [provider]  esomeprazole (NEXIUM) 20 MG capsule Take 20 mg by mouth daily at 12 noon.   Yes [provider]  gabapentin (NEURONTIN) 800 MG tablet Take 800 mg by mouth 3 (three) times daily.  07/02/18  Yes [provider]  lamoTRIgine (LAMICTAL) 150 MG tablet Take 150 mg by mouth 2 (two)  times daily.    Yes [provider]  megestrol (MEGACE) 20 MG tablet Take 1 tablet (20 mg total) by mouth daily. 02/20/19  Yes Joseph Pierini, MD  mirtazapine (REMERON) 15 MG tablet Take 15 mg by mouth at bedtime.  12/04/17  Yes [provider]  ondansetron (ZOFRAN) 4 MG tablet Take 4 mg by mouth every 8 (eight) hours as needed for nausea or vomiting.   Yes [provider]  rizatriptan (MAXALT) 10 MG tablet Take 1 tab at onset of migraine.  May repeat in 2 hrs, if needed.  Max dose: 2 tabs/day. This is a 30 day prescription. 01/08/19  Yes Marcial Pacas, MD  sertraline (ZOLOFT) 100 MG tablet Take 200 mg by mouth daily.    Yes [provider]  VRAYLAR capsule Take 4.5 mg by mouth at bedtime.  04/17/18  Yes [provider]  pantoprazole (PROTONIX) 40 MG tablet Take 1 tablet (40 mg total) by mouth daily. Patient not taking: Reported on 03/17/2019 09/24/18   Lauren Guys, MD  tiZANidine (ZANAFLEX) 4 MG tablet TAKE 1 TABLET BY MOUTH EVERY 6 HOURS AS NEEDED FOR MUSCLE SPASMS. Patient not taking: Reported on 03/17/2019 01/08/19   Marcial Pacas, MD  varenicline (CHANTIX) 1 MG tablet Take 1 tablet (1 mg total) by mouth 2 (two) times daily. Patient not taking: Reported on 03/17/2019 07/11/18   Lauren Guys, MD  fluticasone [facility]) 50 MCG/ACT nasal spray Place 2 sprays into both  nostrils daily. Patient not taking: Reported on 09/26/2018 09/24/18 10/22/18  Lauren Guys, MD     Depression screen [facility] 2/9 03/17/2019 11/05/2018 08/30/2018 08/28/2018 07/31/2018  Decreased Interest 1 0 0 0 0  Down, Depressed, Hopeless 1 0 0 0 0  PHQ - 2 Score 2 0 0 0 0  Altered sleeping 0 - - - -  Tired, decreased energy 0 - - - -  Change in appetite 0 - - - -  Feeling bad or failure about yourself  1 - - - -  Trouble concentrating 0 - - - -  Moving slowly or fidgety/restless 0 - - - -  Suicidal thoughts 0 - - - -  PHQ-9 Score 3 - - - -  Difficult doing work/chores Somewhat difficult - - - -  Some recent data might be hidden     Fall Risk  03/17/2019 11/05/2018 09/24/2018 08/30/2018 08/28/2018  Falls in the past year? 1 1 0 0 0  Comment - - - - -  Number falls in past yr: 0 0 0 0 0  Comment - - - - -  Injury with Fall? 1 1 0 0 0  Comment broke 3 teeth, shoulder bruised. fell down steps 6 months ago - - -  Risk for fall due to : - - - - -  Risk for fall due to: Comment - - - - -  Follow up Falls evaluation completed;Education provided - - Falls evaluation completed -      PHYSICAL EXAM: BP 107/73 Comment: taken from a previous visit  Ht 5\' 3"  (1.6 m)   Wt 158 lb (71.7 kg) Comment: per patient  LMP 06/07/2014 (Exact Date) Comment: irregular cycles  BMI 27.99 kg/m    Wt Readings from Last 3 Encounters:  03/17/19 158 lb (71.7 kg)  11/13/18 189 lb 4 oz (85.8 kg)  11/05/18 186 lb (84.4 kg)       Education/Counseling provided regarding diet and exercise, prevention of chronic diseases, smoking/tobacco cessation, if  applicable, and reviewed "Covered Medicare Preventive Services."

## 2019-03-17 NOTE — Telephone Encounter (Signed)
Schedule an appointment for intermittent Abdominal pain

## 2019-03-18 NOTE — Telephone Encounter (Signed)
Called pt. And scheduled

## 2019-03-18 NOTE — Telephone Encounter (Signed)
Pt called and is requesting an appt as she is doubled over in pain. Please advise.

## 2019-03-19 NOTE — Telephone Encounter (Signed)
Noted TY

## 2019-03-21 ENCOUNTER — Encounter: Payer: Self-pay | Admitting: Family Medicine

## 2019-03-21 ENCOUNTER — Telehealth (INDEPENDENT_AMBULATORY_CARE_PROVIDER_SITE_OTHER): Payer: Medicare Other | Admitting: Family Medicine

## 2019-03-21 VITALS — Ht 63.0 in | Wt 150.0 lb

## 2019-03-21 DIAGNOSIS — R1013 Epigastric pain: Secondary | ICD-10-CM

## 2019-03-21 MED ORDER — ESOMEPRAZOLE MAGNESIUM 20 MG PO CPDR: 20.0000 mg | DELAYED_RELEASE_CAPSULE | Freq: Two times a day (BID) | ORAL | 1 refills | Status: AC

## 2019-03-21 NOTE — Progress Notes (Signed)
Virtual Visit Note  I connected with patient on 03/21/19 at 228pm by video via epic and verified that I am speaking with the correct person using two identifiers. Lauren Chapman is currently located at home and patient is currently with them during visit. The provider, Rutherford Guys, MD is located in their office at time of visit.  I discussed the limitations, risks, security and privacy concerns of performing an evaluation and management service by telephone and the availability of in person appointments. I also discussed with the patient that there may be a patient responsible charge related to this service. The patient expressed understanding and agreed to proceed.   CC: abd pain  HPI ? Patient reports about a week of constant sharp stabbing epigastric pain that radiates to the back She denies any nausea, vomiting, anorexia, black tarry stools, fever, chills She reports watery explosive stools daily after she drinks her morning coffee She denies any recent constipation She denies any reflux She states that nothing makes pain better or worse She started nexium 20mg  daily wo any imporvement She is s/p cholecystectomy She is a smoker    Allergies  Allergen Reactions  . Codeine Anaphylaxis    Anything with codeine    Prior to Admission medications   Medication Sig Start Date End Date Taking? Authorizing Provider  albuterol (PROVENTIL HFA;VENTOLIN HFA) 108 (90 Base) MCG/ACT inhaler Inhale 2 puffs into the lungs every 4 (four) hours as needed for wheezing or shortness of breath. 01/30/18  Yes Olalere, Adewale A, MD  clonazePAM (KLONOPIN) 0.5 MG tablet Take 0.5 mg by mouth 3 (three) times daily.  12/04/17  Yes [provider]  esomeprazole (NEXIUM) 20 MG capsule Take 20 mg by mouth daily at 12 noon.   Yes [provider]  gabapentin (NEURONTIN) 800 MG tablet Take 800 mg by mouth 3 (three) times daily.  07/02/18  Yes [provider]  lamoTRIgine (LAMICTAL)  150 MG tablet Take 150 mg by mouth 2 (two) times daily.    Yes [provider]  megestrol (MEGACE) 20 MG tablet Take 1 tablet (20 mg total) by mouth daily. 02/20/19  Yes Joseph Pierini, MD  ondansetron (ZOFRAN) 4 MG tablet Take 4 mg by mouth every 8 (eight) hours as needed for nausea or vomiting.   Yes [provider]  rizatriptan (MAXALT) 10 MG tablet Take 1 tab at onset of migraine.  May repeat in 2 hrs, if needed.  Max dose: 2 tabs/day. This is a 30 day prescription. 01/08/19  Yes Marcial Pacas, MD  sertraline (ZOLOFT) 100 MG tablet Take 200 mg by mouth daily.    Yes [provider]  tiZANidine (ZANAFLEX) 4 MG tablet TAKE 1 TABLET BY MOUTH EVERY 6 HOURS AS NEEDED FOR MUSCLE SPASMS. 01/08/19  Yes Marcial Pacas, MD  VRAYLAR capsule Take 4.5 mg by mouth at bedtime.  04/17/18  Yes [provider]  mirtazapine (REMERON) 15 MG tablet Take 15 mg by mouth at bedtime.  12/04/17   [provider]  pantoprazole (PROTONIX) 40 MG tablet Take 1 tablet (40 mg total) by mouth daily. Patient not taking: Reported on 03/17/2019 09/24/18   Rutherford Guys, MD  varenicline (CHANTIX) 1 MG tablet Take 1 tablet (1 mg total) by mouth 2 (two) times daily. Patient not taking: Reported on 03/17/2019 07/11/18   Rutherford Guys, MD  fluticasone [facility]) 50 MCG/ACT nasal spray Place 2 sprays into both nostrils daily. Patient not taking: Reported on 09/26/2018 09/24/18 10/22/18  Rutherford Guys, MD    Past Medical History:  Diagnosis Date  . Anxiety   . Bipolar affective psychosis ([place])   . Depression   . Emphysema lung ([facility])    inhaler prn,  followed by pcp and pulmonologist-- dr Margorie John  . GERD (gastroesophageal reflux disease)   . Migraine   . Smokers' cough ([place])    per pt not productive  . SUI (stress urinary incontinence, female)   . Wears glasses     Past Surgical History:  Procedure Laterality Date  . CARPAL TUNNEL RELEASE Right 09-12-2007   @[email or web address]    and GANGLION CYST  EXCISION  . DILATATION & CURETTAGE/HYSTEROSCOPY WITH MYOSURE N/A 11/13/2018   Procedure: DILATATION & CURETTAGE/HYSTEROSCOPY WITH MYOSURE;  Surgeon: Anastasio Auerbach, MD;  Location: [facility];  Service: Gynecology;  Laterality: N/A;  request 9:00am OR start time in [facility] block requests one hour  . KNEE ARTHROSCOPY WITH ANTERIOR CRUCIATE LIGAMENT (ACL) REPAIR Right 10/2015  . LAPAROSCOPIC CHOLECYSTECTOMY  1997    Social History   Tobacco Use  . Smoking status: Current Every Day Smoker    Packs/day: 0.50    Years: 33.00    Pack years: 16.50    Types: Cigarettes    Start date: 10/10/1981  . Smokeless tobacco: Never Used  . Tobacco comment: 11-12-2018  per pt down to 4-5 cig per day  Substance Use Topics  . Alcohol use: Never    Alcohol/week: 0.0 standard drinks    Family History  Problem Relation Age of Onset  . Depression Mother   . Cancer Mother        Cervical  . Alcohol abuse Father   . Crohn's disease Sister   . Stroke Brother 16  . Migraines Daughter   . GER disease Daughter   . Depression Daughter   . Migraines Daughter   . GER disease Daughter     ROS Per hpi  Objective  Vitals as reported by the patient: none  GEN: AAOx3, NAD HEENT: [place]/AT, pupils are symmetrical, EOMI, non-icteric sclera Resp: breathing comfortably, speaking in full sentences Skin: no rashes noted, no pallor Psych: good eye contact, normal mood and affect   ASSESSMENT and PLAN  1. Abdominal pain, epigastric Labs pending. Increase nexium to BID, discussed dietary changes and RTC precautions, consider GI referral for possible EGD if not resolved.  - CBC with Differential/Platelet; Future - Comprehensive metabolic panel; Future - Lipase; Future  Other orders - esomeprazole (NEXIUM) 20 MG capsule; Take 1 capsule (20 mg total) by mouth 2 (two) times daily before a meal.  FOLLOW-UP: 2 weeks, prn   The above assessment and management plan was discussed with  the patient. The patient verbalized understanding of and has agreed to the management plan. Patient is aware to call the clinic if symptoms persist or worsen. Patient is aware when to return to the clinic for a follow-up visit. Patient educated on when it is appropriate to go to the emergency department.    I provided 11 minutes of non-face-to-face time during this encounter.  Rutherford Guys, MD Primary [facility] at [place], [place] [address] Ph.  [phone] Fax [phone]

## 2019-03-21 NOTE — Patient Instructions (Signed)
° ° ° °  If you have lab work done today you will be contacted with your lab results within the next 2 weeks.  If you have not heard from us then please contact us. The fastest way to get your results is to register for My Chart. ° ° °IF you received an x-ray today, you will receive an invoice from [facility]. Please contact [facility] at [phone] with questions or concerns regarding your invoice.  ° °IF you received labwork today, you will receive an invoice from [facility]. Please contact [facility] at [phone] with questions or concerns regarding your invoice.  ° °Our billing staff will not be able to assist you with questions regarding bills from these companies. ° °You will be contacted with the lab results as soon as they are available. The fastest way to get your results is to activate your My Chart account. Instructions are located on the last page of this paperwork. If you have not heard from us regarding the results in 2 weeks, please contact this office. °  ° ° ° °

## 2019-03-24 ENCOUNTER — Telehealth: Payer: Self-pay | Admitting: Family Medicine

## 2019-03-24 ENCOUNTER — Ambulatory Visit (INDEPENDENT_AMBULATORY_CARE_PROVIDER_SITE_OTHER): Payer: Medicare Other | Admitting: Obstetrics and Gynecology

## 2019-03-24 ENCOUNTER — Ambulatory Visit: Payer: Medicare Other

## 2019-03-24 ENCOUNTER — Encounter: Payer: Self-pay | Admitting: Obstetrics and Gynecology

## 2019-03-24 ENCOUNTER — Other Ambulatory Visit: Payer: Self-pay

## 2019-03-24 VITALS — BP 118/76

## 2019-03-24 DIAGNOSIS — R1013 Epigastric pain: Secondary | ICD-10-CM | POA: Diagnosis not present

## 2019-03-24 DIAGNOSIS — N950 Postmenopausal bleeding: Secondary | ICD-10-CM

## 2019-03-24 NOTE — Progress Notes (Signed)
Lauren Chapman  1970-05-10 [id number]  HPI The patient is a 49 y.o. [id number] who presents today for recurrence of postmenopausal bleeding.  She had a hysteroscopy D&C on 11/13/2018 with findings of an endometrial polyp which was benign.  Her last Pap smear was 04/2017 which was normal cytology. High risk HPV was negative. For the [facility], her bleeding had pretty much stopped, but did return again about 1 month ago, starting a spotting and then increasing in flow. She said then she started having having vaginal gushing again and had a change pad several times including into the night. She was put back on Megace 20 mg daily continue to have bleeding, she increased the dose to twice daily and the bleeding did stop then she went back to 1 tablet a day and the bleeding picked up again.  LMP was 3-4 years ago, then she started developing bleeding again. She indicates she was indeed postmenopausal not using any birth control that would have stopped her periods. She had a normal TSH in 08/2018. Denies any easy bleeding or bruising.   Past medical history,surgical history, problem list, medications, allergies, family history and social history were all reviewed and documented as reviewed in the EPIC chart.  ROS:  Feeling well. No dyspnea or chest pain on exertion.  No abdominal pain, change in bowel habits, black or bloody stools.  No urinary tract symptoms. GYN ROS: + abnormal bleeding, no pelvic pain or discharge.  Physical Exam  BP 118/76   LMP 06/07/2014 (Exact Date) Comment: irregular cycles  General: Pleasant female, no acute distress, alert and oriented PELVIC EXAM: VULVA: normal appearing vulva with no masses, tenderness or lesions, VAGINA: normal appearing vagina with normal color and discharge, scant blood in vaginal vault, no lesions, CERVIX: normal appearing cervix without discharge or lesions, no active bleeding, cervix is located far up the long vaginal canal in the upper left corner, UTERUS:  anteverted uterus is normal size, shape, consistency and nontender, ADNEXA: normal adnexa in size, nontender and no masses  Caryn Bee present for the exam  Assessment 49 yo [id number] with persistent postmenopausal bleeding of uncertain etiology  Plan The patient understands and is in agreement that her bleeding is abnormal especially if she is requiring Megace for control, and her recent thorough evaluation in October 2020 revealed just benign tissue findings including a polyp. This is unsettling as she is still having this level of bleeding and makes [place] question if there is perhaps some hyperplastic or malignant tissue abnormality that was not able to be diagnosed by [facility], or she is just forming recurrent benign polyps that are leading to this bleeding.  The next most reasonable step would be to pursue a hysterectomy or her alternative is to repeat hysteroscopy D&C, but she would prefer to pursue definitive treatment. This is very reasonable given that she just recently had a hysteroscopy and she keeps going through these abnormal uterine bleeding episodes that seem to be quite significant.  Given the size of the uterus and based off of the pelvic examination, I think a total laparoscopic hysterectomy approach would be the most appropriate for this patient. As she is menopausal we would also perform bilateral salpingo-oophorectomy pregnancy remove ovaries. As she is early menopausal, she is aware that she could experience increasing vasomotor symptoms with ovary removal. If smoking still, she would not be a good candidate for estrogen replacement, either.  I discussed the hysterectomy procedure in detail with the patient including the length of time  anticipated to perform the procedure, the expectation for overnight stay in the hospital and subsequent discharge the following day, and postoperative convalescence and recovery expectations.  I discussed that she will need to plan for recovery for  potentially up to 6 weeks.  Lifting restrictions of 20 lb and no repetitive squatting, bending, or straining maneuvers while healing.  Pelvic rest restriction was also reviewed.  Not sexually active now.  The patient is aware that hysterectomy is considered to be a major surgical procedure. There are risks of major or minor infection, bleeding, possibility of needing a blood transfusion, injury to bowel, bladder, major pelvic vessels, ureter, nerves from positioning and or skin irritation, and deep venous thrombosis, stroke, MI, PE, and persistent dyspareunia/vaginal shortening.  General anesthesia also has risks including MI, stroke, and death.  Common scenarios for complications from surgery were reviewed with the patient including focus on bladder and/or ureteral injuries, colostomy bag, and fistulas with healing.  Generally the complication rate is less than 1%.  Conversion to laparotomy may be deemed necessary at the time of the procedure, which if performed would result in longer hospital stay, and more postoperative pain and healing time.   Cutting back or quitting smoking altogether would help with postoperative healing and reducing the risk complications, and this is encouraged.  I provided her with the ACOG informational pamphlet on hysterectomy as well as other reading on the procedure and aftercare. I will have her review these materials and return for preoperative visit, we will have staff contact her to set up this and the procedure.  All questions were answered by the end of today's visit.   Joseph Pierini MD 03/24/19

## 2019-03-24 NOTE — Telephone Encounter (Signed)
Pt stated her pharmacy told her she needs a PA for esomeprazole (NEXIUM) 20 MG capsule   Please advise.

## 2019-03-24 NOTE — Patient Instructions (Signed)
Total Laparoscopic Hysterectomy °A total laparoscopic hysterectomy is a minimally invasive surgery to remove the uterus and cervix. The fallopian tubes and ovaries can also be removed (bilateral salpingo-oophorectomy) during this surgery, if necessary. This procedure may be done to treat problems such as: °· Noncancerous growths in the uterus (uterine fibroids) that cause symptoms. °· A condition that causes the lining of the uterus (endometrium) to grow in other areas (endometriosis). °· Problems with pelvic support. This is caused by weakened muscles of the pelvis following vaginal childbirth or menopause. °· Cancer of the cervix, ovaries, uterus, or endometrium. °· Excessive (dysfunctional) uterine bleeding. °This surgery is performed by inserting a thin, lighted tube (laparoscope) and surgical instruments into small incisions in the abdomen. The laparoscope sends images to a monitor. The images help the health care provider perform the procedure. After this procedure, you will no longer be able to have a baby, and you will no longer have a menstrual period. °Tell a health care provider about: °· Any allergies you have. °· All medicines you are taking, including vitamins, herbs, eye drops, creams, and over-the-counter medicines. °· Any problems you or family members have had with anesthetic medicines. °· Any blood disorders you have. °· Any surgeries you have had. °· Any medical conditions you have. °· Whether you are pregnant or may be pregnant. °What are the risks? °Generally, this is a safe procedure. However, problems may occur, including: °· Infection. °· Bleeding. °· Blood clots in the legs or lungs. °· Allergic reactions to medicines. °· Damage to other structures or organs. °· The risk that the surgery may have to be switched to the regular one in which a large incision is made in the abdomen (abdominal hysterectomy). °What happens before the procedure? °Staying hydrated °Follow instructions from your  health care provider about hydration, which may include: °· Up to 2 hours before the procedure - you may continue to drink clear liquids, such as water, clear fruit juice, black coffee, and plain tea °Eating and drinking restrictions °Follow instructions from your health care provider about eating and drinking, which may include: °· 8 hours before the procedure - stop eating heavy meals or foods such as meat, fried foods, or fatty foods. °· 6 hours before the procedure - stop eating light meals or foods, such as toast or cereal. °· 6 hours before the procedure - stop drinking milk or drinks that contain milk. °· 2 hours before the procedure - stop drinking clear liquids. °Medicines °· Ask your health care provider about: °? Changing or stopping your regular medicines. This is especially important if you are taking diabetes medicines or blood thinners. °? Taking over-the-counter medicines, vitamins, herbs, and supplements. °? Taking medicines such as aspirin and ibuprofen. These medicines can thin your blood. Do not take these medicines unless your health care provider tells you to take them. °· You may be given antibiotic medicine to help prevent infection. °· You may be asked to take laxatives. °· You may be given medicines to help prevent nausea and vomiting after the procedure. °General instructions °· Ask your health care provider how your surgical site will be marked or identified. °· You may be asked to shower with a germ-killing soap. °· Do not use any products that contain nicotine or tobacco, such as cigarettes and e-cigarettes. If you need help quitting, ask your health care provider. °· You may have an exam or testing, such as an ultrasound to determine the size and shape of your pelvic organs. °·   You may have a blood or urine sample taken. °· This procedure can affect the way you feel about yourself. Talk with your health care provider about the physical and emotional changes hysterectomy may  cause. °· Plan to have someone take you home from the hospital or clinic. °· Plan to have a responsible adult care for you for at least 24 hours after you leave the hospital or clinic. This is important. °What happens during the procedure? °· To lower your risk of infection: °? Your health care team will wash or sanitize their hands. °? Your skin will be washed with soap. °? Hair may be removed from the surgical area. °· An IV will be inserted into one of your veins. °· You will be given one or more of the following: °? A medicine to help you relax (sedative). °? A medicine to make you fall asleep (general anesthetic). °· You will be given antibiotic medicine through your IV. °· A tube may be inserted down your throat to help you breathe during the procedure. °· A gas (carbon dioxide) will be used to inflate your abdomen to allow your surgeon to see inside of your abdomen. °· Three or four small incisions will be made in your abdomen. °· A laparoscope will be inserted into one of your incisions. Surgical instruments will be inserted through the other incisions in order to perform the procedure. °· Your uterus and cervix may be removed through your vagina or cut into small pieces and removed through the small incisions. Any other organs that need to be removed will also be removed this way. °· Carbon dioxide will be released from inside of your abdomen. °· Your incisions will be closed with stitches (sutures). °· A bandage (dressing) may be placed over your incisions. °The procedure may vary among health care providers and hospitals. °What happens after the procedure? °· Your blood pressure, heart rate, breathing rate, and blood oxygen level will be monitored until the medicines you were given have worn off. °· You will be given medicine for pain and nausea as needed. °· Do not drive for 24 hours if you received a sedative. °Summary °· Total Laparoscopic hysterectomy is a procedure to remove your uterus, cervix and  sometimes the fallopian tubes and ovaries. °· This procedure can affect the way you feel about yourself. Talk with your health care provider about the physical and emotional changes hysterectomy may cause. °· After this procedure, you will no longer be able to have a baby, and you will no longer have a menstrual period. °· You will be given pain medicine to control discomfort after this procedure. °This information is not intended to replace advice given to you by your health care provider. Make sure you discuss any questions you have with your health care provider. °Document Revised: 12/22/2016 Document Reviewed: 03/22/2016 °[employer]vier Patient Education © 2020 [employer] °Total Laparoscopic Hysterectomy, Care After °This sheet gives you information about how to care for yourself after your procedure. Your health care provider may also give you more specific instructions. If you have problems or questions, contact your health care provider. °What can I expect after the procedure? °After the procedure, it is common to have: °· Pain and bruising around your incisions. °· A sore throat, if a breathing tube was used during surgery. °· Fatigue. °· Poor appetite. °· Less interest in sex. °If your ovaries were also removed, it is also common to have symptoms of menopause such as hot flashes, night sweats,   and lack of sleep (insomnia). °Follow these instructions at home: °Bathing °· Do not take baths, swim, or use a hot tub until your health care provider approves. You may need to only take showers for 2-3 weeks. °· Keep your bandage (dressing) dry until your health care provider says it can be removed. °Incision care ° °· Follow instructions from your health care provider about how to take care of your incisions. Make sure you: °? Wash your hands with soap and water before you change your dressing. If soap and water are not available, use hand sanitizer. °? Change your dressing as told by your health care  provider. °? Leave stitches (sutures), skin glue, or adhesive strips in place. These skin closures may need to stay in place for 2 weeks or longer. If adhesive strip edges start to loosen and curl up, you may trim the loose edges. Do not remove adhesive strips completely unless your health care provider tells you to do that. °· Check your incision area every day for signs of infection. Check for: °? Redness, swelling, or pain. °? Fluid or blood. °? Warmth. °? Pus or a bad smell. °Activity °· Get plenty of rest and sleep. °· Do not lift anything that is heavier than 10 lbs (4.5 kg) for one month after surgery, or as long as told by your health care provider. °· Do not drive or use heavy machinery while taking prescription pain medicine. °· Do not drive for 24 hours if you were given a medicine to help you relax (sedative). °· Return to your normal activities as told by your health care provider. Ask your health care provider what activities are safe for you. °Lifestyle ° °· Do not use any products that contain nicotine or tobacco, such as cigarettes and e-cigarettes. These can delay healing. If you need help quitting, ask your health care provider. °· Do not drink alcohol until your health care provider approves. °General instructions °· Do not douche, use tampons, or have sex for at least 6 weeks, or as told by your health care provider. °· Take over-the-counter and prescription medicines only as told by your health care provider. °· To monitor yourself for a fever, take your temperature at least once a day during recovery. °· If you struggle with physical or emotional changes after your procedure, speak with your health care provider or a therapist. °· To prevent or treat constipation while you are taking prescription pain medicine, your health care provider may recommend that you: °? Drink enough fluid to keep your urine clear or pale yellow. °? Take over-the-counter or prescription medicines. °? Eat foods that  are high in fiber, such as fresh fruits and vegetables, whole grains, and beans. °? Limit foods that are high in fat and processed sugars, such as fried and sweet foods. °· Keep all follow-up visits as told by your health care provider. This is important. °Contact a health care provider if: °· You have chills or a fever. °· You have redness, swelling, or pain around an incision. °· You have fluid or blood coming from an incision. °· Your incision feels warm to the touch. °· You have pus or a bad smell coming from an incision. °· An incision breaks open. °· You feel dizzy or light-headed. °· You have pain or bleeding when you urinate. °· You have diarrhea, nausea, or vomiting that does not go away. °· You have abnormal vaginal discharge. °· You have a rash. °· You have pain that does   not get better with medicine. °Get help right away if: °· You have a fever and your symptoms suddenly get worse. °· You have severe abdominal pain. °· You have chest pain. °· You have shortness of breath. °· You faint. °· You have pain, swelling, or redness on your leg. °· You have heavy vaginal bleeding with blood clots. °Summary °· After the procedure it is common to have abdominal pain. Your provider will give you medication for this. °· Do not take baths, swim, or use a hot tub until your health care provider approves. °· Do not lift anything that is heavier than 10 lbs (4.5 kg) for one month after surgery, or as long as told by your health care provider. °· Notify your provider if you have any signs or symptoms of infection after the procedure. °This information is not intended to replace advice given to you by your health care provider. Make sure you discuss any questions you have with your health care provider. °Document Revised: 12/22/2016 Document Reviewed: 03/22/2016 °Elsevier Patient Education © 2020 [employer] ° °

## 2019-03-25 ENCOUNTER — Telehealth: Payer: Self-pay

## 2019-03-25 DIAGNOSIS — F250 Schizoaffective disorder, bipolar type: Secondary | ICD-10-CM | POA: Diagnosis not present

## 2019-03-25 LAB — COMPREHENSIVE METABOLIC PANEL
ALT: 13 IU/L (ref 0–32)
AST: 14 IU/L (ref 0–40)
Albumin/Globulin Ratio: 1.8 (ref 1.2–2.2)
Albumin: 4.4 g/dL (ref 3.8–4.8)
Alkaline Phosphatase: 98 IU/L (ref 39–117)
BUN/Creatinine Ratio: 9 (ref 9–23)
BUN: 10 mg/dL (ref 6–24)
Bilirubin Total: <0.2 mg/dL (ref 0.0–1.2)
CO2: 19 mmol/L — ABNORMAL LOW (ref 20–29)
Calcium: 9.7 mg/dL (ref 8.7–10.2)
Chloride: 107 mmol/L — ABNORMAL HIGH (ref 96–106)
Creatinine, Ser: 1.06 mg/dL — ABNORMAL HIGH (ref 0.57–1.00)
GFR calc Af Amer: 71 mL/min/{1.73_m2} (ref >59)
GFR calc non Af Amer: 61 mL/min/{1.73_m2} (ref >59)
Globulin, Total: 2.5 g/dL (ref 1.5–4.5)
Glucose: 97 mg/dL (ref 65–99)
Potassium: 4.6 mmol/L (ref 3.5–5.2)
Sodium: 143 mmol/L (ref 134–144)
Total Protein: 6.9 g/dL (ref 6.0–8.5)

## 2019-03-25 LAB — CBC WITH DIFFERENTIAL/PLATELET
Basophils Absolute: 0.1 10*3/uL (ref 0.0–0.2)
Basos: 1 %
EOS (ABSOLUTE): 0.2 10*3/uL (ref 0.0–0.4)
Eos: 2 %
Hematocrit: 44.8 % (ref 34.0–46.6)
Hemoglobin: 15.2 g/dL (ref 11.1–15.9)
Immature Grans (Abs): 0 10*3/uL (ref 0.0–0.1)
Immature Granulocytes: 0 %
Lymphocytes Absolute: 2.6 10*3/uL (ref 0.7–3.1)
Lymphs: 26 %
MCH: 31.7 pg (ref 26.6–33.0)
MCHC: 33.9 g/dL (ref 31.5–35.7)
MCV: 94 fL (ref 79–97)
Monocytes Absolute: 0.4 10*3/uL (ref 0.1–0.9)
Monocytes: 4 %
Neutrophils Absolute: 6.7 10*3/uL (ref 1.4–7.0)
Neutrophils: 67 %
Platelets: 241 10*3/uL (ref 150–450)
RBC: 4.79 x10E6/uL (ref 3.77–5.28)
RDW: 13.1 % (ref 11.7–15.4)
WBC: 9.8 10*3/uL (ref 3.4–10.8)

## 2019-03-25 LAB — LIPASE: Lipase: 82 U/L — ABNORMAL HIGH (ref 14–72)

## 2019-03-25 NOTE — Telephone Encounter (Signed)
Left message to call for scheduling.

## 2019-03-25 NOTE — Telephone Encounter (Signed)
I spoke with patient about scheduling surgery.  Scheduled her for 04/29/19.   She has Medicare/Medicaid so no prepymt due. We discussed need for Covid screen and appt scheduled. I discussed quarantine protocol with her.  Pre op appt scheduled with Dr. Raliegh Ip for 04/23/19 at 11am.

## 2019-03-28 ENCOUNTER — Telehealth: Payer: Medicare Other

## 2019-03-28 ENCOUNTER — Telehealth: Payer: Self-pay | Admitting: Family Medicine

## 2019-03-28 NOTE — Telephone Encounter (Signed)
Pt called aging and would like nurse or provider to contact her about the recent Lipase labs on 3/1. Please advise. [phone]

## 2019-03-28 NOTE — Telephone Encounter (Signed)
Pt is wanting a cb concerning her most recent Lipase lab [facility] 03/24/19. She is afraid of what this result might mean. Please advise pt at [phone].

## 2019-03-28 NOTE — Telephone Encounter (Signed)
error 

## 2019-03-28 NOTE — Telephone Encounter (Signed)
Spoke with pcp, one labs are reviewed, will back to pt regarding concerns

## 2019-04-01 HISTORY — PX: TOOTH EXTRACTION: SUR596

## 2019-04-03 ENCOUNTER — Ambulatory Visit (INDEPENDENT_AMBULATORY_CARE_PROVIDER_SITE_OTHER): Payer: Medicare Other | Admitting: Neurology

## 2019-04-03 ENCOUNTER — Other Ambulatory Visit: Payer: Self-pay

## 2019-04-03 ENCOUNTER — Encounter: Payer: Self-pay | Admitting: Neurology

## 2019-04-03 VITALS — BP 111/73 | HR 59 | Temp 97.7°F | Ht 62.0 in | Wt 172.5 lb

## 2019-04-03 DIAGNOSIS — Z79899 Other long term (current) drug therapy: Secondary | ICD-10-CM | POA: Diagnosis not present

## 2019-04-03 DIAGNOSIS — IMO0002 Reserved for concepts with insufficient information to code with codable children: Secondary | ICD-10-CM

## 2019-04-03 DIAGNOSIS — G43709 Chronic migraine without aura, not intractable, without status migrainosus: Secondary | ICD-10-CM

## 2019-04-03 MED ORDER — AJOVY 225 MG/1.5ML ~~LOC~~ SOSY: 225.0000 mg | PREFILLED_SYRINGE | SUBCUTANEOUS | 11 refills | Status: DC

## 2019-04-03 MED ORDER — RIZATRIPTAN BENZOATE 10 MG PO TABS: ORAL_TABLET | ORAL | 6 refills | Status: DC

## 2019-04-03 MED ORDER — ONDANSETRON HCL 4 MG PO TABS: 4.0000 mg | ORAL_TABLET | Freq: Three times a day (TID) | ORAL | 6 refills | Status: DC | PRN

## 2019-04-03 MED ORDER — TIZANIDINE HCL 4 MG PO TABS: 4.0000 mg | ORAL_TABLET | Freq: Four times a day (QID) | ORAL | 6 refills | Status: DC | PRN

## 2019-04-03 NOTE — Progress Notes (Addendum)
PATIENT: Lauren Chapman DOB: 29-Nov-1970  Chief Complaint  Patient presents with  . Migraine    "had tooth removed on Tues, taking ibuprofen 600 mg, have had migriane since then, I am out of tizanidine and rizatriptan"     HISTORICAL Lauren Chapman is a 49 year old right-handed female, seen in refer by her primary care Bettey Costa, NP for evaluation of migraine in October 19 2014  I reviewed and summarized her office visit, reviewed laboratory in July 2016, mild elevated triglycerides 208,  She has a history of bipolar affective disorder, is on polypharmacy treatment, this including lamotrigine, Zoloft, Brintellix, Valium, Cogentin, gabapentin.  She started to have migraine since 49 years old, her typical migraine are lateralized severe pounding headache was associated light noise sensitivity, lasting 1-2 days, she often has alternating of sound, visual disturbance before the onset of migraine, and during severe headaches. over the years, she has had at least twice migraine each week, she has been taking frequent goody powders, which does not work anymore, now she take over-the-counter ibuprofen and Aleve, up to 10 tablets each day to try to get rid of headache with suboptimal response. She was giving a prescription of Imitrex upon why ER presentation for her migraines, which does not work well, she reported loose control of her bowel movements after taking Imitrex.   She was not able to identify trigger for her migraine, she does couple days each week because of her severe headaches, she has never tried any preventive medications in the past  UPDATE Dec 22 2014: She is now taking Inderal 60 mg twice a day, tolerating medication well, she did see mild to moderate improvement of her headache instead of twice week, she is now having her typical migraine once a week, she has left-sided retro-orbital area severe pounding headache with associated light noise sensitivity, nauseous, it  can last for a few days, Maxalt works sometimes, but she has to repeat multiple times, sometimes she go to hospital for injection, she has tried migraine cocktail years, Compazine, Benadryl, Toradol with limited help, sometimes she has to use Demerol about once a year for migraine  UPDATE Mar 08 2015: She did have Botox injection as migraine prevention by rehabilitation physician Dr. Posey Pronto February 04 2015, which has helped her headache, she has less severe, less frequent headaches, couple times each week, she is now receiving naproxen 1-2 times each week for mild to moderate headaches, use Imitrex injection for more severe headaches, which works well for her,  UPDATE Oct 02 2016: She is using imitrex prn once a month, for severe migraine headaches, which did help her headaches, continue Botox injection by rehabilitation physician Dr. Posey Pronto, she reported significant improvement with Botox injection, she is now on lower dose of propanolol 30 mg twice a day, higher dose cause decreased blood pressure  She reported taking Excedrin Migraine about every other day for mild to moderate headaches  UPDATE Jan 25 2017: Laboratory evaluation on January 19, 2017, acetylcholine receptor antibody was negative, normal TSH, CBC, CMP.  She is receiving BOTOX injection every 3 months from rehab physician Dr. Posey Pronto,  most recent injection was on November 08 2016, which has helped her migraine   She still has migraine 1-2 a week, using maxalt, zofran, tizanidine prn, twice a week,   She has develeoped droopy eyelid since June 2018, to the point of covering her vision sometimes, she also complains of chewing difficulty, but denies double vision, no significant limb  muscle weakness.  Update April 04, 2019: Lauren Chapman came back for follow-up after a long gap following last visit in January 2019, complains of worsening migraine headaches, she is still on polypharmacy for her bipolar disorder, Zoloft 200 mg every day,  Remeron 15 mg at bedtime, lamotrigine 150 mg twice a day, gabapentin 800 mg 3 times a day, clonazepam 0.5 mg 3 times a day, Vraylar 4.5 mg at bedtime  She has used up her migraine preventive medications, Maxalt, Zofran, tizanidine, patient stated dose medicine works well for her migraine  She now complains of migraine on a daily basis, has been taking multiple dose of Excedrin Migraine over the past few months, today she came in with 8 out of 10 holoacranial headaches been ongoing for a week, try to hold medications without improvement, previous attempt for Aimovig as migraine premention was not approved by her insurance company.   REVIEW OF SYSTEMS: Full 14 system review of systems performed and notable only for as above All other review of systems were negative.  ALLERGIES: Allergies  Allergen Reactions  . Codeine Anaphylaxis    Anything with codeine    HOME MEDICATIONS: Current Outpatient Medications  Medication Sig Dispense Refill  . albuterol (PROVENTIL HFA;VENTOLIN HFA) 108 (90 Base) MCG/ACT inhaler Inhale 2 puffs into the lungs every 4 (four) hours as needed for wheezing or shortness of breath. 1 Inhaler 1  . clonazePAM (KLONOPIN) 0.5 MG tablet Take 0.5 mg by mouth 3 (three) times daily.   2  . esomeprazole (NEXIUM) 20 MG capsule Take 1 capsule (20 mg total) by mouth 2 (two) times daily before a meal. 60 capsule 1  . gabapentin (NEURONTIN) 800 MG tablet Take 800 mg by mouth 3 (three) times daily.     Marland Kitchen ibuprofen (ADVIL) 600 MG tablet Take 600 mg by mouth every 6 (six) hours as needed.    . lamoTRIgine (LAMICTAL) 150 MG tablet Take 150 mg by mouth 2 (two) times daily.     . megestrol (MEGACE) 20 MG tablet Take 1 tablet (20 mg total) by mouth daily. 30 tablet 1  . mirtazapine (REMERON) 15 MG tablet Take 15 mg by mouth at bedtime.    . ondansetron (ZOFRAN) 4 MG tablet Take 4 mg by mouth every 8 (eight) hours as needed for nausea or vomiting.    . sertraline (ZOLOFT) 100 MG tablet  Take 200 mg by mouth daily.     Marland Kitchen VRAYLAR capsule Take 4.5 mg by mouth at bedtime.     . rizatriptan (MAXALT) 10 MG tablet Take 1 tab at onset of migraine.  May repeat in 2 hrs, if needed.  Max dose: 2 tabs/day. This is a 30 day prescription. (Patient not taking: Reported on 04/03/2019) 15 tablet 2  . tiZANidine (ZANAFLEX) 4 MG tablet TAKE 1 TABLET BY MOUTH EVERY 6 HOURS AS NEEDED FOR MUSCLE SPASMS. (Patient not taking: Reported on 04/03/2019) 30 tablet 2   No current facility-administered medications for this visit.    PAST MEDICAL HISTORY: Past Medical History:  Diagnosis Date  . Anxiety   . Bipolar affective psychosis ([facility])   . Depression   . Emphysema lung ([facility])    inhaler prn,  followed by pcp and pulmonologist-- dr Margorie John  . GERD (gastroesophageal reflux disease)   . Migraine   . Smokers' cough ([facility])    per pt not productive  . SUI (stress urinary incontinence, female)   . Wears glasses     PAST SURGICAL HISTORY: Past Surgical History:  Procedure Laterality Date  . CARPAL TUNNEL RELEASE Right 09-12-2007   @[email or web address]    and GANGLION CYST EXCISION  . DILATATION & CURETTAGE/HYSTEROSCOPY WITH MYOSURE N/A 11/13/2018   Procedure: DILATATION & CURETTAGE/HYSTEROSCOPY WITH MYOSURE;  Surgeon: Anastasio Auerbach, MD;  Location: [facility];  Service: Gynecology;  Laterality: N/A;  request 9:00am OR start time in [facility] block requests one hour  . DILATION AND CURETTAGE OF UTERUS  02/2019  . KNEE ARTHROSCOPY WITH ANTERIOR CRUCIATE LIGAMENT (ACL) REPAIR Right 10/2015  . LAPAROSCOPIC CHOLECYSTECTOMY  1997  . TOOTH EXTRACTION  04/01/2019    FAMILY HISTORY: Family History  Problem Relation Age of Onset  . Depression Mother   . Cancer Mother        Cervical  . Alcohol abuse Father   . Crohn's disease Sister   . Stroke Brother 35  . Migraines Daughter   . GER disease Daughter   . Depression Daughter   . Migraines Daughter   . GER disease Daughter     SOCIAL  HISTORY: Social History   Socioeconomic History  . Marital status: Divorced    Spouse name: n/a  . Number of children: 2  . Years of education: 47  . Highest education level: Not on file  Occupational History  . Occupation: Disabled    Comment: Formerly a Pharmacist, hospital.  Tobacco Use  . Smoking status: Current Every Day Smoker    Packs/day: 0.50    Years: 33.00    Pack years: 16.50    Types: Cigarettes    Start date: 10/10/1981  . Smokeless tobacco: Never Used  . Tobacco comment: 04/03/19   per pt down to 4-5 cig per day  Substance and Sexual Activity  . Alcohol use: Never    Alcohol/week: 0.0 standard drinks  . Drug use: Never  . Sexual activity: Not on file    Comment: 1st intercourse 49 yo-More than 5 partners  Other Topics Concern  . Not on file  Social History Narrative   Lives at home with 1 of her two daughters   Right-handed.   2-4 cups caffeine daily.   Disabled    Social Determinants of Health   Financial Resource Strain:   . Difficulty of Paying Living Expenses:   Food Insecurity:   . Worried About Charity fundraiser in the Last Year:   . Arboriculturist in the Last Year:   Transportation Needs:   . Film/video editor (Medical):   Marland Kitchen Lack of Transportation (Non-Medical):   Physical Activity:   . Days of Exercise per Week:   . Minutes of Exercise per Session:   Stress:   . Feeling of Stress :   Social Connections:   . Frequency of Communication with Friends and Family:   . Frequency of Social Gatherings with Friends and Family:   . Attends Religious Services:   . Active Member of Clubs or Organizations:   . Attends Archivist Meetings:   Marland Kitchen Marital Status:   Intimate Partner Violence:   . Fear of Current or Ex-Partner:   . Emotionally Abused:   Marland Kitchen Physically Abused:   . Sexually Abused:      PHYSICAL EXAM   Vitals:   04/03/19 1047  BP: 111/73  Pulse: (!) 59  Temp: 97.7 F (36.5 C)  Weight: 172 lb 8 oz (78.2 kg)  Height: 5\' 2"   (1.575 m)    Not recorded      Body mass index is 31.55  kg/m.  PHYSICAL EXAMNIATION:  Gen: NAD, conversant, well nourised, well groomed                     Cardiovascular: Regular rate rhythm, no peripheral edema, warm, nontender. Eyes: Conjunctivae clear without exudates or hemorrhage Neck: Supple, no carotid bruits. Pulmonary: Clear to auscultation bilaterally   NEUROLOGICAL EXAM:  MENTAL STATUS: Depressed looking middle-aged female, in distress due to her moderate to severe prolonged headaches Speech:    Speech is normal; fluent and spontaneous with normal comprehension.  Cognition:     Orientation to time, place and person     Normal recent and remote memory     Normal Attention span and concentration     Normal Language, naming, repeating,spontaneous speech     Fund of knowledge   CRANIAL NERVES: CN II: Visual fields are full to confrontation. Pupils are round equal and briskly reactive to light. CN III, IV, VI: extraocular movement are normal. No ptosis. CN V: Facial sensation is intact to light touch CN VII: Face is symmetric with normal eye closure  CN VIII: Hearing is normal to causal conversation. CN IX, X: Phonation is normal. CN XI: Head turning and shoulder shrug are intact  MOTOR: There is no pronator drift of out-stretched arms. Muscle bulk and tone are normal. Muscle strength is normal.  REFLEXES: Reflexes are 2+ and symmetric at the biceps, triceps, knees, and ankles. Plantar responses are flexor.  SENSORY: Intact to light touch, pinprick and vibratory sensation are intact in fingers and toes.  COORDINATION: There is no trunk or limb dysmetria noted.  GAIT/STANCE: Posture is normal. Gait is steady with normal steps, base, arm swing, and turning.   DIAGNOSTIC DATA (LABS, IMAGING, TESTING) - I reviewed patient records, labs, notes, testing and imaging myself where available.   ASSESSMENT AND PLAN  Lauren Chapman is a 49 y.o. female     Chronic migraine headaches Polypharmacy treatment  With exacerbation, likely a medicine rebound component because of her frequent Excedrin Migraine use  For preventive medication, she has tried Botox injection, which was helpful, but she complains of bilateral ptosis, weakness following her last injection in October 2018, extensive evaluation showed negative acetylcholine receptor antibody, she denies limb muscle weakness, I still think she is not a good candidate for botulinum toxin injection as migraine prevention  I personally MRI of brain without contrast in January 2019 that was normal  EMG nerve conduction study in January 2019 was normal  She is on polypharmacy treatment due to her bipolar disorder,  Will try Ajovy 225 mg once every month as migraine prevention  Refilled Maxalt, Zofran, tizanidine as needed for abortive treatment.  I also performed nerve block for her prolonged headache  Marcial Pacas, M.D. Ph.D.  [facility] [address] [place], [place] [address] Ph: [phone] Fax: [phone]  CC: Referring Provider

## 2019-04-04 NOTE — Progress Notes (Signed)
   History: 49 year old female complains of few days history of moderate to severe headache, failed home medications    Bilateral occipital and trigeminal nerve block; trigger point injection of bilateral cervical and upper trapezius muscles for intractable headache  Bupivacaine 0.5% was injected on the scalp bilaterally at several locations:  -On the occipital area of the head, 3 injections each side, 0.5 cc per injection at the midpoint between the mastoid process and the occipital protuberance. 2 other injections were done one finger breadth from the initial injection, one at a 10 o'clock position and the other at a 2 o'clock position.  -2 injections of 0.5 cc were done in the temporal regions, 2 fingerbreadths above the tragus of the ear, with the second injection one fingerbreadth posteriorly to the first.  -2 injections were done on the brow, 1 in the medial brow and one over the supraorbital nerve notch, with 0.1 cc for each injection  -1 injection each side of 0.5 cc was done anterior to the tragus of the ear for a trigeminal ganglion injection  -0.5 cc was injected into bilateral upper trapezius and bilateral upper cervical paraspinals   The patient tolerated the injections well, no complications of the procedure were noted. Injections were made with a 27-gauge needle.

## 2019-04-08 DIAGNOSIS — F250 Schizoaffective disorder, bipolar type: Secondary | ICD-10-CM | POA: Diagnosis not present

## 2019-04-15 DIAGNOSIS — F250 Schizoaffective disorder, bipolar type: Secondary | ICD-10-CM | POA: Diagnosis not present

## 2019-04-15 DIAGNOSIS — F3130 Bipolar disorder, current episode depressed, mild or moderate severity, unspecified: Secondary | ICD-10-CM | POA: Diagnosis not present

## 2019-04-17 ENCOUNTER — Telehealth: Payer: Self-pay | Admitting: Neurology

## 2019-04-17 NOTE — Patient Instructions (Addendum)
YOU ARE SCHEDULED FOR A COVID TEST 04-25-19@ 2:40 PM. THIS TEST MUST BE DONE BEFORE SURGERY. GO TO  [address], [place], [address] AND REMAIN IN YOUR CAR, THIS IS A DRIVE UP TEST. ONCE YOUR COVID TEST IS DONE PLEASE FOLLOW ALL THE QUARANTINE  INSTRUCTIONS GIVEN IN YOUR HANDOUT.      Your procedure is scheduled on 04-29-19   Report to [facility] AT  5:30 A. M.   Call this number if you have problems the morning of surgery  [phone].   OUR ADDRESS IS [place].  WE ARE LOCATED IN [facility].                                     REMEMBER:  DO NOT EAT FOOD OR DRINK LIQUIDS AFTER MIDNIGHT .    YOU MAY  BRUSH YOUR TEETH MORNING OF SURGERY AND RINSE YOUR MOUTH OUT, NO CHEWING GUM CANDY OR MINTS.   TAKE THESE MEDICATIONS MORNING OF SURGERY WITH A SIP OF WATER: Clonazepam (Klonopin), Esomeprazole (Nexium), Gabapentin (Neurontin), Lamotrigine ( Lamictal), Sertraline (Zoloft), and Maxalt, prn  IF YOU ARE SPENDING THE NIGHT AFTER SURGERY PLEASE BRING ALL YOUR PRESCRIPTION MEDICATIONS IN THEIR ORIGINAL BOTTLES.   1 VISITOR IS ALLOWED IN WAITING ROOM ONLY DAY OF SURGERY.   NO VISITOR MAY SPEND THE NIGHT. VISITOR ARE ALLOWED TO STAY UNTIL 800 PM.                                    DO NOT WEAR JEWERLY, MAKE UP, OR NAIL POLISH ON FINGERNAILS.  DO NOT WEAR LOTIONS, POWDERS, PERFUMES OR DEODORANT.  DO NOT SHAVE FOR 24 HOURS PRIOR TO DAY OF SURGERY.  CONTACTS, GLASSES, OR DENTURES MAY NOT BE WORN TO SURGERY.                                    [facility] IS NOT RESPONSIBLE  FOR ANY BELONGINGS.                                                                    Marland Kitchen                                                                                                    [facility] - Preparing for Surgery Before surgery, you can play an important role.  Because skin is not sterile, your skin needs to be as free of germs as possible.  You can reduce the  number of germs on your skin by washing with CHG (chlorahexidine gluconate) soap before surgery.  CHG is an antiseptic cleaner which kills germs and bonds with the skin to continue killing germs even after washing. Please DO NOT use if you have an allergy to CHG or antibacterial soaps.  If your skin becomes reddened/irritated stop using the CHG and inform your nurse when you arrive at Short Stay. Do not shave (including legs and underarms) for at least 48 hours prior to the first CHG shower.  You may shave your face/neck. Please follow these instructions carefully:  1.  Shower with CHG Soap the night before surgery and the  morning of Surgery.  2.  If you choose to wash your hair, wash your hair first as usual with your  normal  shampoo.  3.  After you shampoo, rinse your hair and body thoroughly to remove the  shampoo.                           4.  Use CHG as you would any other liquid soap.  You can apply chg directly  to the skin and wash                       Gently with a scrungie or clean washcloth.  5.  Apply the CHG Soap to your body ONLY FROM THE NECK DOWN.   Do not use on face/ open                           Wound or open sores. Avoid contact with eyes, ears mouth and genitals (private parts).                       Wash face,  Genitals (private parts) with your normal soap.             6.  Wash thoroughly, paying special attention to the area where your surgery  will be performed.  7.  Thoroughly rinse your body with warm water from the neck down.  8.  DO NOT shower/wash with your normal soap after using and rinsing off  the CHG Soap.                9.  Pat yourself dry with a clean towel.            10.  Wear clean pajamas.            11.  Place clean sheets on your bed the night of your first shower and do not  sleep with pets. Day of Surgery : Do not apply any lotions/deodorants the morning of surgery.  Please wear clean clothes to the hospital/surgery center.  FAILURE TO FOLLOW  THESE INSTRUCTIONS MAY RESULT IN THE CANCELLATION OF YOUR SURGERY PATIENT SIGNATURE_________________________________  NURSE SIGNATURE__________________________________  ________________________________________________________________________  WHAT IS A BLOOD TRANSFUSION? Blood Transfusion Information  A transfusion is the replacement of blood or some of its parts. Blood is made up of multiple cells which provide different functions.  Red blood cells carry oxygen and are used for blood loss replacement.  White blood cells fight against infection.  Platelets control bleeding.  Plasma helps clot blood.  Other blood products are available for specialized needs, such as hemophilia or other clotting disorders. BEFORE THE TRANSFUSION  Who gives blood for transfusions?   Healthy volunteers who are fully evaluated to make sure their blood is safe. This is blood bank blood. Transfusion therapy is  the safest it has ever been in the practice of medicine. Before blood is taken from a donor, a complete history is taken to make sure that person has no history of diseases nor engages in risky social behavior (examples are intravenous drug use or sexual activity with multiple partners). The donor's travel history is screened to minimize risk of transmitting infections, such as malaria. The donated blood is tested for signs of infectious diseases, such as HIV and hepatitis. The blood is then tested to be sure it is compatible with you in order to minimize the chance of a transfusion reaction. If you or a relative donates blood, this is often done in anticipation of surgery and is not appropriate for emergency situations. It takes many days to process the donated blood. RISKS AND COMPLICATIONS Although transfusion therapy is very safe and saves many lives, the main dangers of transfusion include:   Getting an infectious disease.  Developing a transfusion reaction. This is an allergic reaction to something  in the blood you were given. Every precaution is taken to prevent this. The decision to have a blood transfusion has been considered carefully by your caregiver before blood is given. Blood is not given unless the benefits outweigh the risks. AFTER THE TRANSFUSION  Right after receiving a blood transfusion, you will usually feel much better and more energetic. This is especially true if your red blood cells have gotten low (anemic). The transfusion raises the level of the red blood cells which carry oxygen, and this usually causes an energy increase.  The nurse administering the transfusion will monitor you carefully for complications. HOME CARE INSTRUCTIONS  No special instructions are needed after a transfusion. You may find your energy is better. Speak with your caregiver about any limitations on activity for underlying diseases you may have. SEEK MEDICAL CARE IF:   Your condition is not improving after your transfusion.  You develop redness or irritation at the intravenous (IV) site. SEEK IMMEDIATE MEDICAL CARE IF:  Any of the following symptoms occur over the next 12 hours:  Shaking chills.  You have a temperature by mouth above 102 F (38.9 C), not controlled by medicine.  Chest, back, or muscle pain.  People around you feel you are not acting correctly or are confused.  Shortness of breath or difficulty breathing.  Dizziness and fainting.  You get a rash or develop hives.  You have a decrease in urine output.  Your urine turns a dark color or changes to pink, red, or brown. Any of the following symptoms occur over the next 10 days:  You have a temperature by mouth above 102 F (38.9 C), not controlled by medicine.  Shortness of breath.  Weakness after normal activity.  The white part of the eye turns yellow (jaundice).  You have a decrease in the amount of urine or are urinating less often.  Your urine turns a dark color or changes to pink, red, or  brown. Document Released: 01/07/2000 Document Revised: 04/03/2011 Document Reviewed: 08/26/2007 [facility] Patient Information 2014 [place], [place].  _______________________________________________________________________

## 2019-04-17 NOTE — Telephone Encounter (Signed)
PA for Ajovy 225mg  completed on covermymeds (key: [id number]). Approved from 04/17/19 until further notice. [id number]. Coverage w/ [employer] [place] - [id number] [phone]). Rizatriptan did not require a PA.  I called [facility] who confirmed the Ajovy and rizatriptan prescriptions went through insurance without any issues. Her co-pay for both medications will be $12.90.  I returned the call to the patient and provided her with this update.

## 2019-04-17 NOTE — Progress Notes (Signed)
PCP - Grant Fontana, MD Cardiologist -   Chest x-ray - 08-25-18 EKG -  Stress Test -  ECHO -  Cardiac Cath -   Sleep Study -  CPAP -   Fasting Blood Sugar -  Checks Blood Sugar _____ times a day  Blood Thinner Instructions: Aspirin Instructions: Last Dose:  Anesthesia review:   Patient denies shortness of breath, fever, cough and chest pain at PAT appointment   Patient verbalized understanding of instructions that were given to them at the PAT appointment. Patient was also instructed that they will need to review over the PAT instructions again at home before surgery.

## 2019-04-17 NOTE — Telephone Encounter (Signed)
Pt called needing to discuss the rizatriptan (MAXALT) 10 MG tablet Pt states she has not received it and she would like to know if it was called in for her or if she is needing this medication. Please advise.

## 2019-04-21 ENCOUNTER — Other Ambulatory Visit: Payer: Self-pay

## 2019-04-21 ENCOUNTER — Encounter (HOSPITAL_COMMUNITY): Payer: Self-pay

## 2019-04-21 ENCOUNTER — Encounter (HOSPITAL_COMMUNITY)
Admission: RE | Admit: 2019-04-21 | Discharge: 2019-04-21 | Disposition: A | Discharge status: Home / Self Care | Payer: Medicare Other | Source: Ambulatory Visit | Attending: Obstetrics and Gynecology | Admitting: Obstetrics and Gynecology

## 2019-04-21 DIAGNOSIS — Z01812 Encounter for preprocedural laboratory examination: Secondary | ICD-10-CM | POA: Insufficient documentation

## 2019-04-21 HISTORY — DX: Family history of other specified conditions: Z84.89

## 2019-04-21 HISTORY — DX: Other complications of anesthesia, initial encounter: T88.59XA

## 2019-04-21 LAB — CBC
HCT: 42.2 % (ref 36.0–46.0)
Hemoglobin: 14.7 g/dL (ref 12.0–15.0)
MCH: 32.1 pg (ref 26.0–34.0)
MCHC: 34.8 g/dL (ref 30.0–36.0)
MCV: 92.1 fL (ref 80.0–100.0)
Platelets: 193 10*3/uL (ref 150–400)
RBC: 4.58 MIL/uL (ref 3.87–5.11)
RDW: 12.5 % (ref 11.5–15.5)
WBC: 8 10*3/uL (ref 4.0–10.5)
nRBC: 0 % (ref 0.0–0.2)

## 2019-04-22 ENCOUNTER — Encounter (HOSPITAL_COMMUNITY): Payer: Self-pay

## 2019-04-22 ENCOUNTER — Other Ambulatory Visit: Payer: Self-pay

## 2019-04-22 DIAGNOSIS — H43813 Vitreous degeneration, bilateral: Secondary | ICD-10-CM | POA: Diagnosis not present

## 2019-04-22 DIAGNOSIS — H4423 Degenerative myopia, bilateral: Secondary | ICD-10-CM | POA: Diagnosis not present

## 2019-04-22 DIAGNOSIS — H04123 Dry eye syndrome of bilateral lacrimal glands: Secondary | ICD-10-CM | POA: Diagnosis not present

## 2019-04-22 LAB — ABO/RH: ABO/RH(D): O POS

## 2019-04-23 ENCOUNTER — Encounter: Payer: Self-pay | Admitting: Obstetrics and Gynecology

## 2019-04-23 ENCOUNTER — Ambulatory Visit (INDEPENDENT_AMBULATORY_CARE_PROVIDER_SITE_OTHER): Payer: Medicare Other | Admitting: Obstetrics and Gynecology

## 2019-04-23 VITALS — BP 116/74

## 2019-04-23 DIAGNOSIS — N950 Postmenopausal bleeding: Secondary | ICD-10-CM

## 2019-04-23 DIAGNOSIS — Z01818 Encounter for other preprocedural examination: Secondary | ICD-10-CM | POA: Diagnosis not present

## 2019-04-23 NOTE — Progress Notes (Signed)
   LANE KNIGHTEN  [birth year] [id number]  HPI The patient is a 49 y.o. [id number] who presents today for preoperative discussion for upcoming [place] BSO cystoscopy planned for 04/29/2019 for ongoing postmenopausal bleeding.  Please see previous notes for details.  Past medical history,surgical history, problem list, medications, allergies, family history and social history were all reviewed and documented as reviewed in the EPIC chart.  ROS:  Feeling well. No dyspnea or chest pain on exertion.  No abdominal pain, change in bowel habits, black or bloody stools.  No urinary tract symptoms. GYN ROS: no abnormal bleeding, pelvic pain or discharge, no breast pain or new or enlarging lumps on self exam. No neurological complaints.  Physical Exam  BP 116/74   LMP 06/07/2014 (Exact Date) Comment: irregular cycles  General: Pleasant female, no acute distress, alert and oriented PELVIC EXAM: Deferred, recently performed at last encounter  Assessment 49 yo G3P2 here for a preoperative encounter in planning for a hysterectomy  Plan We reviewed the surgical counseling concepts that were covered during last visit.  We discussed and reviewed that we will be planning to remove the uterus, cervix, bilateral fallopian tubes and bilateral ovaries, with cystoscopy performed right after the procedure.  We discussed that in early menopause removal of ovaries can sometimes cause an increase in hot flashes right after the surgery.  She understands that if she is continuing to smoke she would not be a candidate for postmenopausal estrogen therapy.  She is encouraged to stop smoking and she will plan to do so right after the surgery, and she is aware that smoking can also increase the infection risk and lead to poor wound healing.  Postoperative recovery expectations restrictions are reviewed.  The process of the surgery, length of time to do the procedure, the need for general anesthesia, the and 1 night stay in the hospital,  risk for conversion to laparotomy are all reviewed with the patient.  Unlikely to find cancer in the uterus or adnexal structures, but if that were inadvertently discovered it would not be until after the surgery has been completed, and in those cases there is need for oncology referral and sometimes further investigation with a repeat surgery with potentially suboptimal staging.  No reason to think this based on the recent thorough evaluation that has been completed.  Surgical risks are reviewed as outlined in the previous note.  She indicates understanding all of the above and her questions are answered to her satisfaction and she is ready to proceed as scheduled.  She is welcome to let me know in the meantime if any other questions come up before the surgery.  Joseph Pierini MD, FACOG 04/23/19

## 2019-04-25 ENCOUNTER — Other Ambulatory Visit (HOSPITAL_COMMUNITY)
Admission: RE | Admit: 2019-04-25 | Discharge: 2019-04-25 | Disposition: A | Discharge status: Home / Self Care | Payer: Medicare Other | Source: Ambulatory Visit | Attending: Obstetrics and Gynecology | Admitting: Obstetrics and Gynecology

## 2019-04-25 DIAGNOSIS — Z01812 Encounter for preprocedural laboratory examination: Secondary | ICD-10-CM | POA: Insufficient documentation

## 2019-04-25 DIAGNOSIS — Z20822 Contact with and (suspected) exposure to covid-19: Secondary | ICD-10-CM | POA: Diagnosis not present

## 2019-04-25 LAB — SARS CORONAVIRUS 2 (TAT 6-24 HRS): SARS Coronavirus 2: NEGATIVE

## 2019-04-27 DIAGNOSIS — Z1211 Encounter for screening for malignant neoplasm of colon: Secondary | ICD-10-CM | POA: Diagnosis not present

## 2019-04-28 NOTE — Anesthesia Preprocedure Evaluation (Addendum)
Anesthesia Evaluation  Patient identified by MRN, date of birth, ID band Patient awake    Reviewed: Allergy & Precautions, NPO status , Patient's Chart, lab work & pertinent test results  Airway Mallampati: II  TM Distance: >3 FB Neck ROM: Full    Dental no notable dental hx.    Pulmonary COPD,  COPD inhaler, Current Smoker and Patient abstained from smoking.,    Pulmonary exam normal breath sounds clear to auscultation       Cardiovascular negative cardio ROS Normal cardiovascular exam Rhythm:Regular Rate:Normal     Neuro/Psych  Headaches, PSYCHIATRIC DISORDERS Anxiety Depression Bipolar Disorder    GI/Hepatic Neg liver ROS, GERD  Medicated and Controlled,  Endo/Other  negative endocrine ROS  Renal/GU negative Renal ROS     Musculoskeletal negative musculoskeletal ROS (+)   Abdominal (+) + obese,   Peds  Hematology negative hematology ROS (+)   Anesthesia Other Findings persistant post menopausal bleeding  Reproductive/Obstetrics                           Anesthesia Physical Anesthesia Plan  ASA: III  Anesthesia Plan: General   Post-op Pain Management:    Induction: Intravenous  PONV Risk Score and Plan: 3 and Ondansetron, Dexamethasone, Midazolam and Treatment may vary due to age or medical condition  Airway Management Planned: Oral ETT  Additional Equipment:   Intra-op Plan:   Post-operative Plan: Extubation in OR  Informed Consent: I have reviewed the patients History and Physical, chart, labs and discussed the procedure including the risks, benefits and alternatives for the proposed anesthesia with the patient or authorized representative who has indicated his/her understanding and acceptance.     Dental advisory given  Plan Discussed with: CRNA  Anesthesia Plan Comments:       Anesthesia Quick Evaluation

## 2019-04-29 ENCOUNTER — Ambulatory Visit (HOSPITAL_BASED_OUTPATIENT_CLINIC_OR_DEPARTMENT_OTHER)
Admission: RE | Admit: 2019-04-29 | Discharge: 2019-04-30 | Disposition: A | Discharge status: Home / Self Care | Payer: Medicare Other | Attending: Obstetrics and Gynecology | Admitting: Obstetrics and Gynecology

## 2019-04-29 ENCOUNTER — Encounter (HOSPITAL_BASED_OUTPATIENT_CLINIC_OR_DEPARTMENT_OTHER)
Admission: RE | Disposition: A | Discharge status: Home / Self Care | Payer: Self-pay | Source: Home / Self Care | Attending: Obstetrics and Gynecology

## 2019-04-29 ENCOUNTER — Other Ambulatory Visit: Payer: Self-pay

## 2019-04-29 ENCOUNTER — Ambulatory Visit (HOSPITAL_BASED_OUTPATIENT_CLINIC_OR_DEPARTMENT_OTHER): Payer: Medicare Other | Admitting: Anesthesiology

## 2019-04-29 ENCOUNTER — Encounter (HOSPITAL_BASED_OUTPATIENT_CLINIC_OR_DEPARTMENT_OTHER): Payer: Self-pay | Admitting: Obstetrics and Gynecology

## 2019-04-29 DIAGNOSIS — K219 Gastro-esophageal reflux disease without esophagitis: Secondary | ICD-10-CM | POA: Diagnosis not present

## 2019-04-29 DIAGNOSIS — N950 Postmenopausal bleeding: Secondary | ICD-10-CM | POA: Diagnosis not present

## 2019-04-29 DIAGNOSIS — N72 Inflammatory disease of cervix uteri: Secondary | ICD-10-CM | POA: Diagnosis not present

## 2019-04-29 DIAGNOSIS — J449 Chronic obstructive pulmonary disease, unspecified: Secondary | ICD-10-CM | POA: Diagnosis not present

## 2019-04-29 DIAGNOSIS — D259 Leiomyoma of uterus, unspecified: Secondary | ICD-10-CM | POA: Diagnosis not present

## 2019-04-29 DIAGNOSIS — F17200 Nicotine dependence, unspecified, uncomplicated: Secondary | ICD-10-CM | POA: Insufficient documentation

## 2019-04-29 DIAGNOSIS — F418 Other specified anxiety disorders: Secondary | ICD-10-CM | POA: Diagnosis not present

## 2019-04-29 DIAGNOSIS — D251 Intramural leiomyoma of uterus: Secondary | ICD-10-CM | POA: Insufficient documentation

## 2019-04-29 DIAGNOSIS — Z8742 Personal history of other diseases of the female genital tract: Secondary | ICD-10-CM

## 2019-04-29 DIAGNOSIS — N83202 Unspecified ovarian cyst, left side: Secondary | ICD-10-CM | POA: Diagnosis not present

## 2019-04-29 DIAGNOSIS — N838 Other noninflammatory disorders of ovary, fallopian tube and broad ligament: Secondary | ICD-10-CM | POA: Diagnosis not present

## 2019-04-29 HISTORY — PX: LAPAROSCOPIC HYSTERECTOMY: SHX1926

## 2019-04-29 HISTORY — PX: CYSTOSCOPY: SHX5120

## 2019-04-29 LAB — POCT PREGNANCY, URINE: Preg Test, Ur: NEGATIVE

## 2019-04-29 LAB — TYPE AND SCREEN
ABO/RH(D): O POS
Antibody Screen: NEGATIVE

## 2019-04-29 SURGERY — HYSTERECTOMY, TOTAL, LAPAROSCOPIC
Anesthesia: General | Site: Bladder

## 2019-04-29 MED ORDER — ONDANSETRON HCL 4 MG/2ML IJ SOLN
4.0000 mg | Freq: Four times a day (QID) | INTRAMUSCULAR | Status: DC | PRN
  Filled 2019-04-29: qty 2

## 2019-04-29 MED ORDER — DOCUSATE SODIUM 100 MG PO CAPS
ORAL_CAPSULE | ORAL | Status: AC
  Filled 2019-04-29: qty 1

## 2019-04-29 MED ORDER — ONDANSETRON HCL 4 MG/2ML IJ SOLN
INTRAMUSCULAR | Status: DC | PRN
  Administered 2019-04-29: 4 mg via INTRAVENOUS

## 2019-04-29 MED ORDER — CARIPRAZINE HCL 1.5 MG PO CAPS
4.5000 mg | ORAL_CAPSULE | Freq: Every day | ORAL | Status: DC
  Administered 2019-04-29: 22:00:00 4.5 mg via ORAL
  Filled 2019-04-29: qty 3

## 2019-04-29 MED ORDER — CEFAZOLIN SODIUM-DEXTROSE 2-4 GM/100ML-% IV SOLN
INTRAVENOUS | Status: AC
  Filled 2019-04-29: qty 100

## 2019-04-29 MED ORDER — CLONAZEPAM 0.5 MG PO TABS
0.5000 mg | ORAL_TABLET | Freq: Three times a day (TID) | ORAL | Status: DC
  Administered 2019-04-29 – 2019-04-30 (×2): 0.5 mg via ORAL
  Filled 2019-04-29: qty 1

## 2019-04-29 MED ORDER — LAMOTRIGINE 150 MG PO TABS
150.0000 mg | ORAL_TABLET | Freq: Two times a day (BID) | ORAL | Status: DC
  Administered 2019-04-29 – 2019-04-30 (×2): 150 mg via ORAL
  Filled 2019-04-29: qty 1

## 2019-04-29 MED ORDER — PROPOFOL 10 MG/ML IV BOLUS
INTRAVENOUS | Status: DC | PRN
  Administered 2019-04-29: 150 mg via INTRAVENOUS

## 2019-04-29 MED ORDER — ACETAMINOPHEN 500 MG PO TABS
1000.0000 mg | ORAL_TABLET | Freq: Once | ORAL | Status: AC
  Administered 2019-04-29: 06:00:00 1000 mg via ORAL
  Filled 2019-04-29: qty 2

## 2019-04-29 MED ORDER — KETOROLAC TROMETHAMINE 30 MG/ML IJ SOLN
30.0000 mg | Freq: Once | INTRAMUSCULAR | Status: DC | PRN
  Filled 2019-04-29: qty 1

## 2019-04-29 MED ORDER — FENTANYL CITRATE (PF) 250 MCG/5ML IJ SOLN
INTRAMUSCULAR | Status: DC | PRN
  Administered 2019-04-29 (×2): 100 ug via INTRAVENOUS
  Administered 2019-04-29: 50 ug via INTRAVENOUS

## 2019-04-29 MED ORDER — DOCUSATE SODIUM 100 MG PO CAPS: 100.0000 mg | ORAL_CAPSULE | Freq: Two times a day (BID) | ORAL | 2 refills | Status: DC

## 2019-04-29 MED ORDER — DEXAMETHASONE SODIUM PHOSPHATE 10 MG/ML IJ SOLN
INTRAMUSCULAR | Status: AC
  Filled 2019-04-29: qty 1

## 2019-04-29 MED ORDER — ACETAMINOPHEN 500 MG PO TABS
ORAL_TABLET | ORAL | Status: AC
  Filled 2019-04-29: qty 2

## 2019-04-29 MED ORDER — LIDOCAINE 20MG/ML (2%) 15 ML SYRINGE OPTIME
INTRAMUSCULAR | Status: DC | PRN
  Administered 2019-04-29: 1.5 mg/kg/h via INTRAVENOUS

## 2019-04-29 MED ORDER — DEXAMETHASONE SODIUM PHOSPHATE 10 MG/ML IJ SOLN
INTRAMUSCULAR | Status: DC | PRN
  Administered 2019-04-29: 4 mg via INTRAVENOUS

## 2019-04-29 MED ORDER — IBUPROFEN 600 MG PO TABS
600.0000 mg | ORAL_TABLET | Freq: Four times a day (QID) | ORAL | Status: DC
  Administered 2019-04-29 (×2): 600 mg via ORAL
  Filled 2019-04-29: qty 1

## 2019-04-29 MED ORDER — PROPOFOL 10 MG/ML IV BOLUS
INTRAVENOUS | Status: AC
  Filled 2019-04-29: qty 40

## 2019-04-29 MED ORDER — CEFAZOLIN SODIUM-DEXTROSE 2-4 GM/100ML-% IV SOLN
2.0000 g | INTRAVENOUS | Status: AC
  Administered 2019-04-29: 08:00:00 2 g via INTRAVENOUS
  Filled 2019-04-29: qty 100

## 2019-04-29 MED ORDER — GABAPENTIN 800 MG PO TABS
800.0000 mg | ORAL_TABLET | Freq: Three times a day (TID) | ORAL | Status: DC
  Administered 2019-04-29 – 2019-04-30 (×2): 800 mg via ORAL
  Filled 2019-04-29: qty 1

## 2019-04-29 MED ORDER — PANTOPRAZOLE SODIUM 40 MG PO TBEC
DELAYED_RELEASE_TABLET | ORAL | Status: AC
  Filled 2019-04-29: qty 1

## 2019-04-29 MED ORDER — LIDOCAINE 2% (20 MG/ML) 5 ML SYRINGE
INTRAMUSCULAR | Status: AC
  Filled 2019-04-29: qty 5

## 2019-04-29 MED ORDER — ALBUTEROL SULFATE HFA 108 (90 BASE) MCG/ACT IN AERS
2.0000 | INHALATION_SPRAY | RESPIRATORY_TRACT | Status: DC | PRN
  Administered 2019-04-30: 03:00:00 2 via RESPIRATORY_TRACT
  Filled 2019-04-29 (×2): qty 6.7

## 2019-04-29 MED ORDER — LACTATED RINGERS IV SOLN
INTRAVENOUS | Status: DC
  Filled 2019-04-29 (×2): qty 1000

## 2019-04-29 MED ORDER — IBUPROFEN 800 MG PO TABS
800.0000 mg | ORAL_TABLET | Freq: Four times a day (QID) | ORAL | Status: DC
  Filled 2019-04-29: qty 1

## 2019-04-29 MED ORDER — SUGAMMADEX SODIUM 200 MG/2ML IV SOLN
INTRAVENOUS | Status: DC | PRN
  Administered 2019-04-29: 175 mg via INTRAVENOUS

## 2019-04-29 MED ORDER — LACTATED RINGERS IV BOLUS
1000.0000 mL | Freq: Once | INTRAVENOUS | Status: DC
  Filled 2019-04-29: qty 1000

## 2019-04-29 MED ORDER — POLYETHYLENE GLYCOL 3350 17 G PO PACK
17.0000 g | PACK | Freq: Every day | ORAL | Status: DC | PRN
  Filled 2019-04-29: qty 1

## 2019-04-29 MED ORDER — IBUPROFEN 200 MG PO TABS
ORAL_TABLET | ORAL | Status: AC
  Filled 2019-04-29: qty 3

## 2019-04-29 MED ORDER — IBUPROFEN 600 MG PO TABS: 600.0000 mg | ORAL_TABLET | Freq: Four times a day (QID) | ORAL | 1 refills | Status: AC

## 2019-04-29 MED ORDER — HYDROMORPHONE HCL 1 MG/ML IJ SOLN
0.2500 mg | INTRAMUSCULAR | Status: DC | PRN
  Filled 2019-04-29: qty 0.5

## 2019-04-29 MED ORDER — OXYCODONE HCL 5 MG PO TABS
5.0000 mg | ORAL_TABLET | ORAL | Status: DC | PRN
  Administered 2019-04-29 – 2019-04-30 (×2): 5 mg via ORAL
  Filled 2019-04-29: qty 2

## 2019-04-29 MED ORDER — BUPIVACAINE HCL 0.25 % IJ SOLN
10.0000 mL | Freq: Once | INTRAMUSCULAR | Status: DC
  Filled 2019-04-29: qty 10

## 2019-04-29 MED ORDER — DOCUSATE SODIUM 100 MG PO CAPS
100.0000 mg | ORAL_CAPSULE | Freq: Two times a day (BID) | ORAL | Status: DC
  Administered 2019-04-29 – 2019-04-30 (×2): 100 mg via ORAL
  Filled 2019-04-29: qty 1

## 2019-04-29 MED ORDER — SOD CITRATE-CITRIC ACID 500-334 MG/5ML PO SOLN
30.0000 mL | ORAL | Status: DC
  Filled 2019-04-29: qty 30

## 2019-04-29 MED ORDER — SERTRALINE HCL 100 MG PO TABS
200.0000 mg | ORAL_TABLET | Freq: Every day | ORAL | Status: DC
  Administered 2019-04-30: 08:00:00 200 mg via ORAL
  Filled 2019-04-29: qty 2

## 2019-04-29 MED ORDER — KETAMINE HCL 10 MG/ML IJ SOLN
INTRAMUSCULAR | Status: AC
  Filled 2019-04-29: qty 1

## 2019-04-29 MED ORDER — PROMETHAZINE HCL 25 MG/ML IJ SOLN
6.2500 mg | INTRAMUSCULAR | Status: DC | PRN
  Filled 2019-04-29: qty 1

## 2019-04-29 MED ORDER — SIMETHICONE 80 MG PO CHEW
80.0000 mg | CHEWABLE_TABLET | Freq: Four times a day (QID) | ORAL | Status: DC | PRN
  Filled 2019-04-29: qty 1

## 2019-04-29 MED ORDER — PHENAZOPYRIDINE HCL 200 MG PO TABS
200.0000 mg | ORAL_TABLET | ORAL | Status: AC
  Administered 2019-04-29: 06:00:00 200 mg via ORAL
  Filled 2019-04-29: qty 1

## 2019-04-29 MED ORDER — ROCURONIUM BROMIDE 10 MG/ML (PF) SYRINGE
PREFILLED_SYRINGE | INTRAVENOUS | Status: DC | PRN
  Administered 2019-04-29 (×2): 10 mg via INTRAVENOUS
  Administered 2019-04-29: 50 mg via INTRAVENOUS

## 2019-04-29 MED ORDER — BUPIVACAINE HCL (PF) 0.25 % IJ SOLN
INTRAMUSCULAR | Status: DC | PRN
  Administered 2019-04-29: 15 mL

## 2019-04-29 MED ORDER — PANTOPRAZOLE SODIUM 40 MG PO TBEC
40.0000 mg | DELAYED_RELEASE_TABLET | Freq: Two times a day (BID) | ORAL | Status: DC
  Administered 2019-04-29: 22:00:00 40 mg via ORAL
  Filled 2019-04-29: qty 1

## 2019-04-29 MED ORDER — ACETAMINOPHEN 500 MG PO TABS
1000.0000 mg | ORAL_TABLET | Freq: Four times a day (QID) | ORAL | Status: DC
  Administered 2019-04-29 – 2019-04-30 (×4): 1000 mg via ORAL
  Filled 2019-04-29: qty 2

## 2019-04-29 MED ORDER — ACETAMINOPHEN 500 MG PO TABS: 1000.0000 mg | ORAL_TABLET | Freq: Four times a day (QID) | ORAL | Status: AC

## 2019-04-29 MED ORDER — PHENAZOPYRIDINE HCL 100 MG PO TABS
ORAL_TABLET | ORAL | Status: AC
  Filled 2019-04-29: qty 2

## 2019-04-29 MED ORDER — KETOROLAC TROMETHAMINE 30 MG/ML IJ SOLN
INTRAMUSCULAR | Status: AC
  Filled 2019-04-29: qty 1

## 2019-04-29 MED ORDER — SODIUM CHLORIDE 0.9 % IR SOLN
Status: DC | PRN
  Administered 2019-04-29: 3000 mL
  Administered 2019-04-29: 1000 mL via INTRAVESICAL

## 2019-04-29 MED ORDER — LIDOCAINE 2% (20 MG/ML) 5 ML SYRINGE
INTRAMUSCULAR | Status: DC | PRN
  Administered 2019-04-29: 100 mg via INTRAVENOUS

## 2019-04-29 MED ORDER — MENTHOL 3 MG MT LOZG
1.0000 | LOZENGE | OROMUCOSAL | Status: DC | PRN
  Filled 2019-04-29: qty 9

## 2019-04-29 MED ORDER — MIRTAZAPINE 15 MG PO TABS
15.0000 mg | ORAL_TABLET | Freq: Every day | ORAL | Status: DC
  Administered 2019-04-29: 22:00:00 15 mg via ORAL
  Filled 2019-04-29: qty 1

## 2019-04-29 MED ORDER — LIDOCAINE HCL 2 % IJ SOLN
INTRAMUSCULAR | Status: AC
  Filled 2019-04-29: qty 20

## 2019-04-29 MED ORDER — SCOPOLAMINE 1 MG/3DAYS TD PT72
MEDICATED_PATCH | TRANSDERMAL | Status: AC
  Filled 2019-04-29: qty 1

## 2019-04-29 MED ORDER — KETAMINE HCL 10 MG/ML IJ SOLN
INTRAMUSCULAR | Status: DC | PRN
  Administered 2019-04-29: 25 mg via INTRAVENOUS

## 2019-04-29 MED ORDER — ROCURONIUM BROMIDE 10 MG/ML (PF) SYRINGE
PREFILLED_SYRINGE | INTRAVENOUS | Status: AC
  Filled 2019-04-29: qty 10

## 2019-04-29 MED ORDER — KETOROLAC TROMETHAMINE 30 MG/ML IJ SOLN
30.0000 mg | Freq: Four times a day (QID) | INTRAMUSCULAR | Status: DC
  Administered 2019-04-29 – 2019-04-30 (×2): 30 mg via INTRAVENOUS
  Filled 2019-04-29: qty 1

## 2019-04-29 MED ORDER — ONDANSETRON HCL 4 MG/2ML IJ SOLN
INTRAMUSCULAR | Status: AC
  Filled 2019-04-29: qty 2

## 2019-04-29 MED ORDER — MIDAZOLAM HCL 2 MG/2ML IJ SOLN
INTRAMUSCULAR | Status: AC
  Filled 2019-04-29: qty 2

## 2019-04-29 MED ORDER — OXYCODONE HCL 5 MG PO TABS: 5.0000 mg | ORAL_TABLET | Freq: Four times a day (QID) | ORAL | 0 refills | Status: DC | PRN

## 2019-04-29 MED ORDER — SCOPOLAMINE 1 MG/3DAYS TD PT72
1.0000 | MEDICATED_PATCH | TRANSDERMAL | Status: DC
  Administered 2019-04-29: 06:00:00 1.5 mg via TRANSDERMAL
  Filled 2019-04-29: qty 1

## 2019-04-29 MED ORDER — MIDAZOLAM HCL 2 MG/2ML IJ SOLN
INTRAMUSCULAR | Status: DC | PRN
  Administered 2019-04-29: 2 mg via INTRAVENOUS

## 2019-04-29 MED ORDER — OXYCODONE HCL 5 MG PO TABS
ORAL_TABLET | ORAL | Status: AC
  Filled 2019-04-29: qty 1

## 2019-04-29 MED ORDER — ALUM & MAG HYDROXIDE-SIMETH 200-200-20 MG/5ML PO SUSP
30.0000 mL | ORAL | Status: DC | PRN
  Filled 2019-04-29: qty 30

## 2019-04-29 MED ORDER — LACTATED RINGERS IV SOLN
INTRAVENOUS | Status: DC
  Filled 2019-04-29: qty 1000

## 2019-04-29 MED ORDER — FENTANYL CITRATE (PF) 250 MCG/5ML IJ SOLN
INTRAMUSCULAR | Status: AC
  Filled 2019-04-29: qty 5

## 2019-04-29 MED ORDER — ONDANSETRON HCL 4 MG PO TABS
4.0000 mg | ORAL_TABLET | Freq: Four times a day (QID) | ORAL | Status: DC | PRN
  Filled 2019-04-29: qty 1

## 2019-04-29 MED ORDER — TIZANIDINE HCL 4 MG PO TABS
4.0000 mg | ORAL_TABLET | Freq: Four times a day (QID) | ORAL | Status: DC | PRN
  Filled 2019-04-29: qty 1

## 2019-04-29 SURGICAL SUPPLY — 61 items
ADH SKN CLS APL DERMABOND .7 (GAUZE/BANDAGES/DRESSINGS) ×2
BARRIER ADHS 3X4 INTERCEED (GAUZE/BANDAGES/DRESSINGS) IMPLANT
BRR ADH 4X3 ABS CNTRL BYND (GAUZE/BANDAGES/DRESSINGS)
CABLE HIGH FREQUENCY MONO STRZ (ELECTRODE) ×3 IMPLANT
CANISTER SUCT 3000ML PPV (MISCELLANEOUS) ×3 IMPLANT
DECANTER SPIKE VIAL GLASS SM (MISCELLANEOUS) ×3 IMPLANT
DEFOGGER SCOPE WARMER CLEARIFY (MISCELLANEOUS) ×3 IMPLANT
DERMABOND ADVANCED (GAUZE/BANDAGES/DRESSINGS) ×1
DERMABOND ADVANCED .7 DNX12 (GAUZE/BANDAGES/DRESSINGS) ×2 IMPLANT
DEVICE SUTURE ENDOST 10MM (ENDOMECHANICALS) ×4 IMPLANT
DURAPREP 26ML APPLICATOR (WOUND CARE) ×3 IMPLANT
GLOVE BIO SURGEON STRL SZ 6.5 (GLOVE) ×3 IMPLANT
GLOVE BIO SURGEON STRL SZ7 (GLOVE) ×2 IMPLANT
GLOVE BIO SURGEON STRL SZ8 (GLOVE) ×6 IMPLANT
GLOVE BIOGEL PI IND STRL 7.0 (GLOVE) IMPLANT
GLOVE BIOGEL PI IND STRL 7.5 (GLOVE) IMPLANT
GLOVE BIOGEL PI IND STRL 8 (GLOVE) ×4 IMPLANT
GLOVE BIOGEL PI INDICATOR 7.0 (GLOVE) ×3
GLOVE BIOGEL PI INDICATOR 7.5 (GLOVE) ×2
GLOVE BIOGEL PI INDICATOR 8 (GLOVE) ×2
GOWN STRL REUS W/ TWL LRG LVL3 (GOWN DISPOSABLE) ×2 IMPLANT
GOWN STRL REUS W/ TWL XL LVL3 (GOWN DISPOSABLE) ×2 IMPLANT
GOWN STRL REUS W/TWL LRG LVL3 (GOWN DISPOSABLE) ×12
GOWN STRL REUS W/TWL XL LVL3 (GOWN DISPOSABLE) ×9
HIBICLENS CHG 4% 4OZ (MISCELLANEOUS) ×5 IMPLANT
KIT TURNOVER CYSTO (KITS) ×3 IMPLANT
LIGASURE LAP L-HOOKWIRE 5 44CM (INSTRUMENTS) ×1 IMPLANT
LIGASURE VESSEL 5MM BLUNT TIP (ELECTROSURGICAL) ×3 IMPLANT
MANIPULATOR VCARE LG CRV RETR (MISCELLANEOUS) IMPLANT
MANIPULATOR VCARE SML CRV RETR (MISCELLANEOUS) IMPLANT
MANIPULATOR VCARE STD CRV RETR (MISCELLANEOUS) ×1 IMPLANT
OCCLUDER COLPOPNEUMO (BALLOONS) ×3 IMPLANT
PACK LAPAROSCOPY BASIN (CUSTOM PROCEDURE TRAY) ×3 IMPLANT
PACK TRENDGUARD 450 HYBRID PRO (MISCELLANEOUS) IMPLANT
PAD ARMBOARD 7.5X6 YLW CONV (MISCELLANEOUS) ×6 IMPLANT
PAD OB MATERNITY 4.3X12.25 (PERSONAL CARE ITEMS) ×3 IMPLANT
POUCH LAPAROSCOPIC INSTRUMENT (MISCELLANEOUS) ×6 IMPLANT
SCISSORS LAP 5X35 DISP (ENDOMECHANICALS) IMPLANT
SET IRRIG TUBING LAPAROSCOPIC (IRRIGATION / IRRIGATOR) ×3 IMPLANT
SET IRRIG Y TYPE TUR BLADDER L (SET/KITS/TRAYS/PACK) IMPLANT
SET TRI-LUMEN FLTR TB AIRSEAL (TUBING) ×3 IMPLANT
SET TUBE SMOKE EVAC HIGH FLOW (TUBING) ×3 IMPLANT
SOLUTION ELECTROLUBE (MISCELLANEOUS) IMPLANT
SUT ENDO VLOC 180-0-8IN (SUTURE) ×1 IMPLANT
SUT MNCRL AB 4-0 PS2 18 (SUTURE) ×6 IMPLANT
SUT VICRYL 0 UR6 27IN ABS (SUTURE) ×8 IMPLANT
SYR 50ML LL SCALE MARK (SYRINGE) ×3 IMPLANT
SYSTEM CARTER THOMASON II (TROCAR) ×1 IMPLANT
TIP RUMI ORANGE 6.7MMX12CM (TIP) IMPLANT
TIP UTERINE 5.1X6CM LAV DISP (MISCELLANEOUS) IMPLANT
TIP UTERINE 6.7X10CM GRN DISP (MISCELLANEOUS) IMPLANT
TIP UTERINE 6.7X6CM WHT DISP (MISCELLANEOUS) IMPLANT
TIP UTERINE 6.7X8CM BLUE DISP (MISCELLANEOUS) IMPLANT
TOWEL OR 17X26 10 PK STRL BLUE (TOWEL DISPOSABLE) ×3 IMPLANT
TRAY FOLEY W/BAG SLVR 14FR LF (SET/KITS/TRAYS/PACK) ×3 IMPLANT
TRENDGUARD 450 HYBRID PRO PACK (MISCELLANEOUS)
TROCAR BALLN 12MMX100 BLUNT (TROCAR) IMPLANT
TROCAR BLADELESS OPT 5 100 (ENDOMECHANICALS) ×4 IMPLANT
TROCAR PORT AIRSEAL 5X120 (TROCAR) ×2 IMPLANT
TROCAR XCEL NON-BLD 11X100MML (ENDOMECHANICALS) ×3 IMPLANT
UNDERPAD 30X36 HEAVY ABSORB (UNDERPADS AND DIAPERS) ×3 IMPLANT

## 2019-04-29 NOTE — Progress Notes (Signed)
Orthostatic VS obtained on patient d/t persistent hypotension. Patient continues to state her baseline b/p is low 90s over 60s. Patient is drowsy but oriented, denies dizziness. While patient was bending over in BR after urinating, patient became dizzy and was assisted back to bed. VS obtained, b/p 95/57, HR 71, O2 94% RA. No s/s bleeding noted. IV team was paged x 2 to obtain IV access, IV team informed staff they "are in a mandatory meeting and will come in 30 minutes". Will continue to monitor and obtain IV access ASAP in preparation for IV bolus. Patient remains stable.

## 2019-04-29 NOTE — Anesthesia Postprocedure Evaluation (Signed)
Anesthesia Post Note  Patient: Lauren Chapman  Procedure(s) Performed: HYSTERECTOMY TOTAL LAPAROSCOPIC, BILATERAL SALPINO-OOPHORECTOMY (Bilateral Abdomen) CYSTOSCOPY (N/A Bladder)     Patient location during evaluation: PACU Anesthesia Type: General Level of consciousness: awake and alert Pain management: pain level controlled Vital Signs Assessment: post-procedure vital signs reviewed and stable Respiratory status: spontaneous breathing, nonlabored ventilation, respiratory function stable and patient connected to nasal cannula oxygen Cardiovascular status: blood pressure returned to baseline and stable Postop Assessment: no apparent nausea or vomiting Anesthetic complications: no    Last Vitals:  Vitals:   04/29/19 1311 04/29/19 1451  BP: (!) 97/57 (!) 100/49  Pulse: 70 70  Resp:    Temp: 36.7 C   SpO2: 95%     Last Pain:  Vitals:   04/29/19 1215  TempSrc:   PainSc: 2                  Kavaughn Faucett P Vashon Arch

## 2019-04-29 NOTE — Op Note (Signed)
Name: Lauren Chapman  Age: 49 y.o.  Date of Birth: [birth year]  Medical Record #: [id number]  Operative Note  Preoperative Diagnosis: Persistent postmenopausal bleeding Procedure: Total laparoscopic hysterectomy, bilateral salpingo-oophorectomy, cystoscopy Postoperative Diagnosis: same Surgeon: Joseph Pierini, MD Assistant: Princess Bruins, MD Estimated Blood Loss: 100 mL Anesthesia: General, local with 0.25% bupivacaine Specimen: Uterus, cervix, bilateral fallopian tubes and ovaries Findings: Normal sized uterus with subserosal anterior fibroid about 3 cm in size.  Normal bilateral ovaries and fallopian tubes.  Bilateral ureteral efflux on cystoscopy. Complications: None. Date: 04/29/19  Under general anesthesia, Lauren Chapman was placed in the dorsolithotomy position with legs in Yellofin stirrups.  SCDs were applied to the lower extremities and were functioning.  She underwent bimanual exam and was prepped and draped in the usual sterile fashion. A surgical team time out was performed to verify and agree on procedure and patient consent. Two grams of IV cefazolin were administered prior to the beginning of the procedure.  A speculum was placed in the vagina, and a single toothed tenaculum was used to grasp the anterior cervix. A uterine sound was placed to gauge the depth of uterine cavity (8 cm). A series of Pratt dilators were used to dilate the cervix to accommodate a V-care device, and the balloon was inflated with 10 mL of air. The cervical cup was placed and carefully checked to ensure that no vaginal tissue encroached the edges of the cup. Once placed, the vaginal cup was inserted and snugged up against the cervical cup and was locked in place. The speculum and tenaculum were removed.  Local anesthesia was injected in the infraumbilical fold.  A 5 mm infraumbilical incision was made and a 5 mm Visiport trocar was inserted with the laparoscope in place utilizing the direct entry method  until the trocar was visualized to be in the abdominal cavity.  Pneumoperitoneum was established to 15 mmHg.  Intraperitoneal position was confirmed. 11 mm right lateral and 5 mm left lateral incisions were made and respective trocars were placed into the incisions under laparoscopic guidance after injection with local anesthesia and transillumination of the abdominal wall performed to avoid vessel injury.  Inspection of the pelvic cavity was performed.  No injury to the bowel or omentum was noted from trocar entry.  The course of bilateral pelvic ureters was identified.  The right infundibulopelvic ligament was visualized to be well away from the course of the ureter.  The IP ligament was double ligated and transected with the Ligasure bipolar device (with retractable monopolar L-Hook) using bipolar energy.  The fallopian tube was freed from the adnexa using the Ligasure in the above manner.  Additional sections of the cardinal ligaments were transected in a similar fashion bilaterally staying close to the uterus and away from the pelvic sidewall.  The assistant utilized the uterine manipulator to provide cephalad and traction to move the uterus contralateral to the side of dissection.  This sequence was repeated on the left side in the same fashion.    Careful blunt and sharp dissection of the vesicouterine peritoneum allowed the bladder to be dissected free from the lower uterus without concern for perforation.  Incisions into the anterior and posterior serosal surfaces allowed entry into the paracervical tissue space.   Uterine artery, uterine vein, and anatomosing vessels were ligated bilaterally with the Ligasure.   Attention was then paid to the colpotomy incision. The edge of the V-care cup was identified by applying gentle cephalad force through the tissue planes  on the V-care device. The monopolar hook of the Ligasure was used to incise and cut within a shallow groove of the presenting face of the  V-care cup.  There was an arterial bleeder at the anterior left lateral vaginal cuff that was secured with bipolar energy fulguration.  At this same time there was a temporary problem with maintaining pneumoperitoneum due to a trocar issue and that blocked flow of insufflation, and pneumperitoneum was reestablished by switching the insufflation tubing attachment to another trocar.  Hemostasis around the remainder of the colpotomy was accomplished with light monopolar fulguration.  The uterus was removed vaginally by placing a ring forceps on the cervix under direct visualization vaginally.  The uterus and cervix were sent as a specimen to pathology with both fallopian tubes and ovaries attached.  A pneumo-occlusion device was placed vaginally and pneumoperitoneum was reestablished to 15 mmHg.  The vaginal cuff was closed with a running V-Lock suture using the Covidien Endo-stitch, avoiding bladder, and taking care to incorporate vaginal epithelium and posterior peritoneum as much as possible.  The pelvis was irrigated and suctioned with the suction-irrigator.  Pneumoperitoneum was evacuated as the pedicles and vaginal cuff were evaluated and noted to be hemostatic.  Trocars were removed with a blunt probe placed through the trocar port to avoid entraping bowel in the trocar incisions.    The RLQ incision was inspected to find the fascia, which was grasped with a Kocher and closed with a running stitch with #0-Vicryl suture.  The subcutaneous tissue of the RLQ incision was irrigated and dried.  The skin incisions were each closed with 3-0 Monocryl suture with a subcuticular closure with Dermabond applied over the top.    Attention was directed vaginally.  The Foley catheter balloon was deflated, removed, and kept sterile.  A 70 degree cystoscope was inserted into the bladder and bilateral ureteral efflux of pyridium stained urine was noted within minutes.  There were no sutures visible from within the bladder.   The bladder contents were drained and the Foley catheter was reinserted.  Instrument, sponge, and needle counts were correct at closure of peritoneum and also at the conclusion of the procedure. The patient tolerated the procedure well and she was taken to the recovery room in stable condition.   The surgical assist was present for the full case and was needed to assist with retraction, visualization, and wound closure.    Joseph Pierini, M.D., [place]lynn June

## 2019-04-29 NOTE — Progress Notes (Signed)
Staff was able to obtain IV site. Patient's b/p 98/57. Dr. Delilah Shan notified, orders obtained for IV bolus. Also informed Dr. Delilah Shan of marked area of bruising to R lateral incision. Will continue to monitor. Instructed by Dr. Delilah Shan to hold any scheduled medications that would decrease b/p.

## 2019-04-29 NOTE — Anesthesia Procedure Notes (Signed)
Procedure Name: Intubation Date/Time: 04/29/2019 7:36 AM Performed by: Lollie Sails, CRNA Pre-anesthesia Checklist: Patient identified, Emergency Drugs available, Suction available, Patient being monitored and Timeout performed Patient Re-evaluated:Patient Re-evaluated prior to induction Oxygen Delivery Method: Circle system utilized Preoxygenation: Pre-oxygenation with 100% oxygen Induction Type: IV induction Ventilation: Mask ventilation without difficulty Laryngoscope Size: Miller and 3 Grade View: Grade I Tube type: Oral Tube size: 7.0 mm Number of attempts: 1 Airway Equipment and Method: Stylet Placement Confirmation: ETT inserted through vocal cords under direct vision,  positive ETCO2 and breath sounds checked- equal and bilateral Secured at: 23 cm Tube secured with: Tape Dental Injury: Teeth and Oropharynx as per pre-operative assessment

## 2019-04-29 NOTE — H&P (Signed)
   Lauren Chapman 24-Apr-1970 MRN: [id number]  HPI The patient is a 49 y.o. [id number] who presents today for scheduled hysterectomy for persistent postmenopausal bleeding with negative workup.  No changes to her medical history since her pre op exam, denies CP, SOB, fever/chills, dysuria.  Past Surgical History:  Procedure Laterality Date  . CARPAL TUNNEL RELEASE Right 09-12-2007   @[facility]    and GANGLION CYST EXCISION  . DILATATION & CURETTAGE/HYSTEROSCOPY WITH MYOSURE N/A 11/13/2018   Procedure: DILATATION & CURETTAGE/HYSTEROSCOPY WITH MYOSURE;  Surgeon: Anastasio Auerbach, MD;  Location: [facility];  Service: Gynecology;  Laterality: N/A;  request 9:00am OR start time in [facility] block requests one hour  . DILATION AND CURETTAGE OF UTERUS  02/2019  . GANGLION CYST EXCISION     R wrist  . KNEE ARTHROSCOPY WITH ANTERIOR CRUCIATE LIGAMENT (ACL) REPAIR Right 10/2015  . LAPAROSCOPIC CHOLECYSTECTOMY  1997  . TOOTH EXTRACTION  04/01/2019      Physical Exam   BP 113/79   Pulse 82   Temp (!) 97 F (36.1 C) (Oral)   Resp 16   Ht 5' 2.5" (1.588 m)   Wt 78.7 kg   LMP 06/07/2014 (Exact Date) Comment: irregular cycles  SpO2 97%   BMI 31.25 kg/m    General: Pleasant female, no acute distress, alert and oriented CV: RRR, no murmurs Pulm: good respiratory effort, CTAB     Plan Proceed with total laparoscopic hysterectomy, bilateral salpingo oophroectomy and cystoscopy as planned.  Does okay with narcotic pain relievers other than codeine, just with nausea side effect.  Aware that hot flashes possible with BSO, has abstained from smoking.  All questions answered and the patient agrees to proceed.    Joseph Pierini, MD 04/29/19

## 2019-04-29 NOTE — Transfer of Care (Signed)
Immediate Anesthesia Transfer of Care Note  Patient: Lauren Chapman  Procedure(s) Performed: HYSTERECTOMY TOTAL LAPAROSCOPIC, BILATERAL SALPINO-OOPHORECTOMY (Bilateral Abdomen) CYSTOSCOPY (N/A Bladder)  Patient Location: PACU  Anesthesia Type:General  Level of Consciousness: awake, sedated and responds to stimulation  Airway & Oxygen Therapy: Patient Spontanous Breathing and Patient connected to face mask oxygen  Post-op Assessment: Report given to RN and Post -op Vital signs reviewed and stable  Post vital signs: Reviewed and stable  Last Vitals:  Vitals Value Taken Time  BP    Temp 36.4 C 04/29/19 1017  Pulse 79 04/29/19 1017  Resp 8 04/29/19 1017  SpO2 97 % 04/29/19 1017  Vitals shown include unvalidated device data.  Last Pain:  Vitals:   04/29/19 [id number]  TempSrc: Oral  PainSc: 0-No pain      Patients Stated Pain Goal: 6 ([id number])  Complications: No apparent anesthesia complications

## 2019-04-29 NOTE — Progress Notes (Signed)
Received patient from PACU, upon moving from stretcher to bed IV site was pulled out by patient. Attempted new IV access x 3 without success. Patient is tolerating PO fluids, has adequate urinary output to foley, and VSS. Dr. Delilah Shan notified, order to leave out IV obtained, IV toradol changed to PO ibuprofen.  Patient states baseline b/p is "90s over 60s". Denies any s/s. Will continue to monitor.

## 2019-04-30 DIAGNOSIS — J449 Chronic obstructive pulmonary disease, unspecified: Secondary | ICD-10-CM | POA: Diagnosis not present

## 2019-04-30 DIAGNOSIS — D251 Intramural leiomyoma of uterus: Secondary | ICD-10-CM | POA: Diagnosis not present

## 2019-04-30 DIAGNOSIS — F17200 Nicotine dependence, unspecified, uncomplicated: Secondary | ICD-10-CM | POA: Diagnosis not present

## 2019-04-30 DIAGNOSIS — N950 Postmenopausal bleeding: Secondary | ICD-10-CM | POA: Diagnosis not present

## 2019-04-30 LAB — SURGICAL PATHOLOGY

## 2019-04-30 LAB — HEMOGLOBIN: Hemoglobin: 11.1 g/dL — ABNORMAL LOW (ref 12.0–15.0)

## 2019-04-30 MED ORDER — OXYCODONE HCL 5 MG PO TABS
ORAL_TABLET | ORAL | Status: AC
  Filled 2019-04-30: qty 1

## 2019-04-30 MED ORDER — ACETAMINOPHEN 500 MG PO TABS
ORAL_TABLET | ORAL | Status: AC
  Filled 2019-04-30: qty 2

## 2019-04-30 MED ORDER — DOCUSATE SODIUM 100 MG PO CAPS
ORAL_CAPSULE | ORAL | Status: AC
  Filled 2019-04-30: qty 1

## 2019-04-30 MED ORDER — KETOROLAC TROMETHAMINE 30 MG/ML IJ SOLN
INTRAMUSCULAR | Status: AC
  Filled 2019-04-30: qty 1

## 2019-04-30 NOTE — Discharge Summary (Signed)
DCSUMGYN Name: Lauren Chapman  Age: 49 y.o.  Date of Birth: 06-Feb-1970  Medical Record #: [id number]   Discharge Summary  DISCHARGE DIAGNOSIS: 1.  Persistent postmenopausal bleeding requiring hormonal treatment 2.  Uterine leiomyoma 3.  Total laparoscopic hysterectomy, bilateral salpingo-oophorectomy, cystoscopy  ADMISSION DATE: 04/29/2019 DISMISSAL DATE: 04/30/2019  Lauren Chapman was admitted for the above surgery and had no intraoperative complications.  Her postoperative course was notable for soft blood pressures (89-105/48-68) that were just slightly below her normal low baseline blood pressures.  Pulse remained in the normal range overall and she was afebrile.  She was asymptomatic and ambulated fine without significant lightheadedness.  Her IV fell out soon after surgery and was replaced later in the afternoon and she received a fluid bolus with no significant change in blood pressures.  Urine output remained excellent.  Postoperative hemoglobin was within the expected range. She is tolerating a general diet without restriction on discharge.  Pain is currently controlled with oral medications and the patient is voiding and ambulating well prior to discharge.  Vital signs: BP (!) 96/56 (BP Location: Right Arm)   Pulse 80   Temp 98.2 F (36.8 C) (Oral)   Resp 16   Ht 5' 2.5" (1.588 m)   Wt 78.7 kg   LMP 06/07/2014 (Exact Date) Comment: irregular cycles  SpO2 95%   BMI 31.25 kg/m   Patient is afebrile and normotensive at discharge. Abdomen is nondistended with positive bowel sounds.  Lungs are clear to auscultation bilaterally.  Incisions clean, dry and intact x 3. No signs of erythema nor exudate.  Ecchymosis outlined surrounding the RLQ incision does not appear to be cellulitis/infection.  Normal skin warmth surrounding the incision, no drainage or purulence.  Labs are within expected limits at discharge.  Discharge medications include Ibuprofen 600 mg to be taken every six hours as  needed for pain, oxycodone 5 mg to be taken every four to six hours as needed for moderate to severe pain, and appropriate use of stool softeners to avoid postoperative constipation. A common recommendation is to use Colace 100 mg by mouth two times a day as needed (hold Colace for loose stools). Potential adverse reactions to current medications were discussed.  Current Outpatient Medications  Medication Instructions  . acetaminophen (TYLENOL) 1,000 mg, Oral, Every 6 hours, Then as needed for pain  . Ajovy 225 mg, Subcutaneous, Every 30 days  . albuterol (PROVENTIL HFA;VENTOLIN HFA) 108 (90 Base) MCG/ACT inhaler 2 puffs, Inhalation, Every 4 hours PRN  . clonazePAM (KLONOPIN) 0.5 mg, Oral, 3 times daily  . docusate sodium (COLACE) 100 mg, Oral, 2 times daily, Hold for loose stool  . esomeprazole (NEXIUM) 20 mg, Oral, 2 times daily before meals  . gabapentin (NEURONTIN) 800 mg, Oral, 3 times daily  . ibuprofen (ADVIL) 600 mg, Oral, Every 6 hours, With food. Then as needed for pain.  Marland Kitchen lamoTRIgine (LAMICTAL) 150 mg, Oral, 2 times daily  . mirtazapine (REMERON) 15 mg, Oral, Daily at bedtime  . ondansetron (ZOFRAN) 4 mg, Oral, Every 8 hours PRN  . oxyCODONE (ROXICODONE) 5 mg, Oral, Every 6 hours PRN  . rizatriptan (MAXALT) 10 MG tablet Take 1 tab at onset of migraine.  May repeat in 2 hrs, if needed.  Max dose: 2 tabs/day. This is a 30 day prescription.  . sertraline (ZOLOFT) 200 mg, Oral, Daily  . tiZANidine (ZANAFLEX) 4 mg, Oral, Every 6 hours PRN, TAKE 1 TABLET BY MOUTH EVERY 6 HOURS AS NEEDED FOR  MUSCLE SPASMS.  Arman Filter 4.5 mg, Oral, Daily at bedtime    Patients may often be constipated after surgery due to prolonged periods of bedrest and use of oral narcotic analgesics. If there is no bowel movement for 3 days after surgery, or if the patient is uncomfortable and unable to pass stool, she will try one or all of the following measures: 1.  Milk of Magnesia, 30 cc by mouth every 12 hours   2.  Dulcolax suppository, one suppository per rectum every 6 hours 3.  Metamucil, Fibercon or other bulk former, used as directed 4.  Fleets Enema 5.  Prunes or Prune Juice 6.  Miralax The patient is instructed to contact the Ob/Gyn clinic if these measures are unsuccessful or accompanied by severe abdominal pain, nausea and emesis, or fever.  Emergency Medical Treatment and Active Labor Act (EMTALA):  Patient has no emergency condition and is stable for discharge.  Condition at discharge is independent but with activity restrictions of no lifting >20 lbs., no driving while on narcotic pain medications, and nothing per vagina for six to eight weeks.  Keep any incisions clean and dry by running soapy water over them and dabbing them dry. It is best to shower for the first week while the healing process is underway; after one week it is safe to take a bath.   Patient is to refrain from placing anything in the vagina within the next six weeks.  This pelvic rest includes intercourse, douching, and tampons.  The patient will call the [facility] for problems such as fever with chills, nausea, vomiting, constipation, heavy odorous vaginal discharge, burning, or pain associated with urination.  Also call for excessive vaginal bleeding, saturating more than one pad an hour for more than three consecutive hours.  We thoroughly reviewed the instructions for discharge and postoperative precautions at the bedside.  We will keep an eye on the ecchymosis surrounding the RLQ incision and let us know if there are any signs of infection which we reviewed.  Follow-up in the [facility] in 2 weeks for an incision check and in 6 weeks for a complete postoperative visit.  She is advised to return to clinic earlier if signs of fever, increasing pain, or other postoperative concerns arise.     Joseph Pierini, M.D. 04/30/19

## 2019-05-01 ENCOUNTER — Encounter: Payer: Self-pay | Admitting: Obstetrics and Gynecology

## 2019-05-01 ENCOUNTER — Other Ambulatory Visit: Payer: Self-pay

## 2019-05-01 ENCOUNTER — Telehealth: Payer: Self-pay | Admitting: *Deleted

## 2019-05-01 ENCOUNTER — Ambulatory Visit (INDEPENDENT_AMBULATORY_CARE_PROVIDER_SITE_OTHER): Payer: Medicare Other | Admitting: Obstetrics and Gynecology

## 2019-05-01 VITALS — BP 118/74

## 2019-05-01 DIAGNOSIS — T8131XA Disruption of external operation (surgical) wound, not elsewhere classified, initial encounter: Secondary | ICD-10-CM

## 2019-05-01 DIAGNOSIS — Z9889 Other specified postprocedural states: Secondary | ICD-10-CM | POA: Diagnosis not present

## 2019-05-01 DIAGNOSIS — Z90710 Acquired absence of both cervix and uterus: Secondary | ICD-10-CM | POA: Diagnosis not present

## 2019-05-01 MED ORDER — CEPHALEXIN 500 MG PO CAPS: 500.0000 mg | ORAL_CAPSULE | Freq: Four times a day (QID) | ORAL | 0 refills | Status: AC

## 2019-05-01 NOTE — Progress Notes (Signed)
   Lauren Chapman  09-Jul-1970 [id number]  HPI The patient is a 50 y.o. [id number] POD#2 following TLH BSO for persistent postmenopausal bleeding who presents today for valuation of her right lower quadrant incision.  She did call in to the answering service this morning and after speaking with her it sounds that she had incision concerns.  The fascia was difficult to palpate to close the 11 mm incision and with the manual manipulation of the tissue, she was noted to have ecchymosis in the area that was outlined with a marking pen during her overnight stay after surgery.  She has not noticed any drainage from the incision.  She just feels that her skin incision has opened with the stitches pulling through.  She is not having any fever or chills.  She is tolerating oral intake fine.  She overall is tired.  She is passing gas but no bowel movement at this point.  Voiding function has been fine.  Vaginal bleeding has been within normal limits.  Her pain is manageable with oral pain medications.   Physical Exam  BP 118/74   General: Pleasant female, no acute distress, alert and oriented Abdomen: Soft, mild distention, nontender.  Positive bowel sounds throughout.  Right lower quadrant incision notable for induration, what appears to be surrounding ecchymosis that has extended inferiorly past the border that was marked with a marking pen.  Not excessively warm to touch, no purulence, no drainage, palpation of the area elicits mild tenderness.  Skin edges are well normal and healthy without sign of necrosis.  Stitches at the skin have pulled through.  The skin incision is reapproximated by placing Mastisol on the skin around the periphery of the incision and Steri-Strips over it to help hold it together.  Assessment 49 yo POD#2 TLH BSO with superficial wound separation/dehiscence and incisional ecchymosis, possible early cellulitis  Plan I reassured her that overall she seems to be doing well and meeting's  operative milestones other than the RLQ incision does have a superficial dehiscence as the stitches have pulled through.  This can happen due to the indurated tissue from the ecchymosis as manual manipulation was required to help locate her fascia to close it during the time of the surgery.  The mechanical tension on the incision and surrounding tissues have therefore caused the ecchymosis.  It is also possible since the redness had spread inferiorly past the marked border that she could be developing cellulitis, so I will cover her with Keflex 500 mg 4 times daily for 1 week. She is to let me know if there are any signs of infection such as purulent drainage or pus, spreading redness over the abdomen, or other concerns.  Otherwise we will see her back in 2 weeks for her first routine postoperative checkup. I did show her pictures from the surgery and gave her copies of pages 2 through 3 and will give her the first page copy when we see her back.  I did review the benign pathology report from the hysterectomy specimens. No charge for today's postoperative encounter.    Joseph Pierini MD, FACOG 05/01/19

## 2019-05-01 NOTE — Telephone Encounter (Signed)
-----   Message from Joseph Pierini, MD sent at 05/01/2019  7:59 AM EDT ----- Regarding: incision after TLH Please call the patient and have her come in this morning so I can look at her incision. Thanks

## 2019-05-01 NOTE — Telephone Encounter (Signed)
appt made

## 2019-05-05 ENCOUNTER — Encounter: Payer: Self-pay | Admitting: Obstetrics and Gynecology

## 2019-05-08 LAB — COLOGUARD: Cologuard: NEGATIVE

## 2019-05-09 ENCOUNTER — Other Ambulatory Visit: Payer: Self-pay

## 2019-05-09 ENCOUNTER — Ambulatory Visit (INDEPENDENT_AMBULATORY_CARE_PROVIDER_SITE_OTHER): Payer: Medicare Other | Admitting: Obstetrics and Gynecology

## 2019-05-09 ENCOUNTER — Encounter: Payer: Self-pay | Admitting: Obstetrics and Gynecology

## 2019-05-09 VITALS — BP 118/76

## 2019-05-09 DIAGNOSIS — S301XXA Contusion of abdominal wall, initial encounter: Secondary | ICD-10-CM

## 2019-05-09 DIAGNOSIS — T8131XA Disruption of external operation (surgical) wound, not elsewhere classified, initial encounter: Secondary | ICD-10-CM

## 2019-05-09 NOTE — Progress Notes (Signed)
CHUMY MOMENT  [birth year] [id number]  HPI The patient is a 49 y.o. [id number] who presents today for a postoperative wound check. She is POD#10 following a TLH BSO for persistent postmenopausal bleeding, pathology results indicated benign tissues.  She had a lot of bruising around her right lower quadrant 11 mm skin incision, she had concerns about pain and spreading redness, so she did come in on POD#2 at our suggestion for evaluation of the wound in clinic.  There was concern for superficial wound separation and possible early cellulitis in addition to the ecchymosis, so she was put on a 7-day course of Keflex which she finished.  Steri-Strips were applied over the incision.  Midway through the antibiotic course we checked in and she said she was doing fine and not having any drainage from the incision and that it was looking better, however a few days later leading into today, she had noted that the incision had started open again and the Steri-Strips had fallen off.  She had noticed a little bit of drainage but no purulence or pus.  We arrange for her to come in to have a wound evaluation today.  Overall she says she is doing well and her pain has been controlled with ibuprofen at this point.  She is tolerating a regular diet.  She does feels overall pretty tired still.  She denies any fever/chills.  She has been ambulating a lot particularly at the encouragement of her mother.  Her bowel and bladder function have been fine.  Past medical history,surgical history, problem list, medications, allergies, family history and social history were all reviewed and documented as reviewed in the EPIC chart.  Physical Exam  BP 118/76   LMP 06/07/2014 (Exact Date) Comment: irregular cycles  General: Pleasant female, appears well, no acute distress, alert and oriented Abdomen: Soft, nontender, nondistended.  Infraumbilical and left lower quadrant incisions are clean, dry, intact, well-healing, no erythema nor  drainage.  The right lower quadrant incision has Steri-Strips lying over it but appears to be slightly separated/dehisced superficially with small amount of necrotic tissue at the wound edges.  There is no active drainage.  There is no surrounding cellulitis.  The ecchymosis has resolved.  Procedure Wound incision and debridement The patient was consented to proceed with wound incision and debridement.  She agreed to proceed. The Steri-Strips and bandages were removed from the incision.  The incision and surrounding skin was cleansed with a Betadine swab.  2.5 mL of 1% lidocaine were injected around the skin edges of the incision wound.   Adequate anesthesia was confirmed.  Maintaining sterile field and using sterile gloves, an 11 blade scalpel was used to sharply debride the necrotic tissue from the edges of the wound, revealing healthy viable and vascular tissue beneath.  The floor of the wound was probed with a hemostat and a wound seroma cavity was entered that was approximately 6 cm deep into the subcutaneous adipose.  A copious amount of clear seroma fluid without purulence was drained from the cavity with continued probing.  The patient tolerated this very well.  Probing the deeper portions of the cavity indicated that the underlying fascia felt intact without defect.  A continuous piece of 1/4 inch iodoform gauze was packed into the seroma cavity to the level of the surface of the wound and covered with a by 4 gauze pad and skin tape.  Assessment 49 yo G3P2 POD#10 from [place] BSO with a RLQ incision wound seroma  Plan  We discussed the plan to continue packing the wound to allow healing by secondary intention to occur.   There are no signs of infection on the examination today, thus, no indication for antibiotic at this time.  She will let us know if there is any purulent or puslike drainage, signs of cellulitis, or increasing pain, or if this is happening over the weekend she will need to seek  emergent care. She is aware that the wound seroma will continue to drain fluid with the packing acting as a wick, so frequent changes of the overlying pads will be necessary. We discussed the concept of healing by secondary intention with granulation tissue.  She acknowledged understanding. She will return to the office on Monday for a packing change.   Joseph Pierini MD, FACOG 05/09/19

## 2019-05-12 ENCOUNTER — Ambulatory Visit (INDEPENDENT_AMBULATORY_CARE_PROVIDER_SITE_OTHER): Payer: Medicare Other | Admitting: Obstetrics and Gynecology

## 2019-05-12 ENCOUNTER — Encounter: Payer: Self-pay | Admitting: Obstetrics and Gynecology

## 2019-05-12 ENCOUNTER — Other Ambulatory Visit: Payer: Self-pay

## 2019-05-12 VITALS — BP 116/78

## 2019-05-12 DIAGNOSIS — S301XXD Contusion of abdominal wall, subsequent encounter: Secondary | ICD-10-CM

## 2019-05-12 DIAGNOSIS — T8131XA Disruption of external operation (surgical) wound, not elsewhere classified, initial encounter: Secondary | ICD-10-CM

## 2019-05-12 MED ORDER — CEPHALEXIN 500 MG PO CAPS: 500.0000 mg | ORAL_CAPSULE | Freq: Four times a day (QID) | ORAL | 0 refills | Status: AC

## 2019-05-12 NOTE — Progress Notes (Signed)
   Lauren Chapman  [birth year] [id number]  HPI The patient is a 49 y.o. [id number] who presents today for wound repacking.  She is POD#13 following a TLH BSO on 04/29/2019.  She came in on POD#10 where we discovered a wound seroma in the deep subcutaneous tissue measuring about 6 cm deep and drained it and debrided the edges of her incision in the office.  We have her doing wound packing and she is coming into the office every other day for this this week.  In general, the amount of drainage from the incision has decreased over the weekend.  She did notice some darker red blood drainage after doing laundry today.  Says she is not lifting her laundry basket just wheeling it around.  She is still tired in general and sleeping more than she is used to.  Otherwise she is doing well, pain is controlled, tolerating normal diet, bowel and bladder function are normal.   Physical Exam  BP 116/78   LMP 06/07/2014 (Exact Date) Comment: irregular cycles  Abdomen: Soft, nontender, nondistended.  Infraumbilical and left lower quadrant incisions are clean, dry, intact, well-healing, no erythema nor drainage.  The right lower quadrant incision packing is removed, granulation tissue was seen glistening at the base of the incision.  No purulence but there is induration in the skin edges of the incision with mild erythema.  There is no active drainage.   The wound is irrigated with 10 mL of sterile saline and dried.  A single continuous moistened piece of quarter inch endoform gauze is placed into the incision and wound cavity for a wet-to-dry wound packing change.  Assessment 49 yo G3P2 POD#13 from [place] BSO with a RLQ incision wound seroma, here for wet-to-dry packing change  Plan Continue wet-to-dry dressing changes every 48 hours Will restart Keflex 500 mg 4 times daily for 7 days  Encouraged her to follow operative precautions and restrictions Reassurance given that recovery from surgery does tire one out so her  fatigue is considered within normal limits at this point this soon after the surgery.    Joseph Pierini MD 05/12/19

## 2019-05-13 ENCOUNTER — Ambulatory Visit: Payer: Medicare Other | Admitting: Obstetrics and Gynecology

## 2019-05-13 DIAGNOSIS — F3130 Bipolar disorder, current episode depressed, mild or moderate severity, unspecified: Secondary | ICD-10-CM | POA: Diagnosis not present

## 2019-05-13 DIAGNOSIS — F250 Schizoaffective disorder, bipolar type: Secondary | ICD-10-CM | POA: Diagnosis not present

## 2019-05-14 ENCOUNTER — Ambulatory Visit (INDEPENDENT_AMBULATORY_CARE_PROVIDER_SITE_OTHER): Payer: Medicare Other | Admitting: Obstetrics and Gynecology

## 2019-05-14 ENCOUNTER — Other Ambulatory Visit: Payer: Self-pay

## 2019-05-14 ENCOUNTER — Encounter: Payer: Self-pay | Admitting: Obstetrics and Gynecology

## 2019-05-14 VITALS — BP 122/80

## 2019-05-14 DIAGNOSIS — T8131XA Disruption of external operation (surgical) wound, not elsewhere classified, initial encounter: Secondary | ICD-10-CM

## 2019-05-14 DIAGNOSIS — S301XXD Contusion of abdominal wall, subsequent encounter: Secondary | ICD-10-CM

## 2019-05-14 NOTE — Progress Notes (Signed)
   Lauren Chapman  Nov 18, 1970 [id number]  HPI The patient is a 49 y.o. [id number] who presents today for wound repacking.  She is POD#15 following a TLH BSO on 04/29/2019.  She came in on POD#10 where we discovered a wound seroma in the deep subcutaneous tissue measuring about 6 cm deep and drained it and debrided the edges of her incision in the office.  We have her doing wound packing and she is coming into the office every other day for this week.  Overall she is doing well, pain is controlled, tolerating normal diet, bowel and bladder function are normal.   Physical Exam  BP 122/80 (BP Location: Right Arm, Patient Position: Sitting, Cuff Size: Normal)   LMP 06/07/2014 (Exact Date) Comment: irregular cycles  Abdomen: Soft, nontender, nondistended.  Infraumbilical and left lower quadrant incisions are clean, dry, intact, well-healing, no erythema nor drainage.  The right lower quadrant incision packing is removed, granulation tissue was seen glistening at the base of the incision.  No purulence but there is induration in the skin edges of the incision.  There is no active drainage.   The wound is irrigated with 10 mL of sterile saline and dried.  A single continuous moistened piece of quarter inch endoform gauze is placed into the incision and wound cavity for a wet-to-dry wound packing change.  Assessment 49 yo G3P2 POD#15 from [place] BSO with a RLQ incision wound seroma, here for wet-to-dry packing change  Plan Continue wet-to-dry dressing changes every 48 hours.  Healing from the wound seroma is going as planned, she is requiring less packing each time.  She will see my partner Dr. Dellis Filbert for her next visit on 05/16/2019 for evaluation and repacking the wound, and then will follow up with me the following week. Continue Keflex 500 mg 4 times daily for 7 days (through 05/18/2019). Let us know if any concerns of wound infection.   Joseph Pierini MD 05/14/19

## 2019-05-16 ENCOUNTER — Ambulatory Visit (INDEPENDENT_AMBULATORY_CARE_PROVIDER_SITE_OTHER): Payer: Medicare Other | Admitting: Obstetrics & Gynecology

## 2019-05-16 ENCOUNTER — Other Ambulatory Visit: Payer: Self-pay

## 2019-05-16 ENCOUNTER — Encounter: Payer: Self-pay | Admitting: Obstetrics & Gynecology

## 2019-05-16 VITALS — BP 118/78

## 2019-05-16 DIAGNOSIS — T8131XD Disruption of external operation (surgical) wound, not elsewhere classified, subsequent encounter: Secondary | ICD-10-CM

## 2019-05-16 DIAGNOSIS — S301XXD Contusion of abdominal wall, subsequent encounter: Secondary | ICD-10-CM

## 2019-05-16 NOTE — Progress Notes (Signed)
    Lauren Chapman 07/29/1970 [id number]        49 y.o.  [id number]   RP: F/U wound care for incisional seroma  HPI: Post TLH/BSO on 04/29/2019.  Right lower abdominal incision developed a seroma which was incised/drained/debrided on 05/09/2019.  Patient is on Keflex.  No pain, minimal drainage at the incision.  No fever.   OB History  Gravida Para Term Preterm AB Living  3 2 2  0 1 2  SAB TAB Ectopic Multiple Live Births  1 0 0 0      # Outcome Date GA Lbr Len/2nd Weight Sex Delivery Anes PTL Lv  3 Term           2 Term           1 SAB             Past medical history,surgical history, problem list, medications, allergies, family history and social history were all reviewed and documented in the EPIC chart.   Directed ROS with pertinent positives and negatives documented in the history of present illness/assessment and plan.  Exam:  Vitals:   05/16/19 1003  BP: 118/78   General appearance:  Normal  Abdomen: Rt lower abdo incision.  Dressing removed.  Small packing removed.  Very healthy tissue at the small bed of the seroma and skin.  No pus, no necrosis, no sign of infection.  Wound irrigated with saline.  New packing inserted.  New dressing applied.  Gynecologic exam: Deferred.   Assessment/Plan:  48 y.o. [id number]   1. Abdominal wall seroma, subsequent encounter Very good healing post drainage of seroma at the right lower abdominal incision.  Very shallow wound with no sign of infection.  Wound cleaned and new packing inserted.  New dressing applied.  Patient will continue on K flex.  Follow-up with Dr. Talbert Forest on Monday, April 26.  Precautions reviewed.  Princess Bruins MD, 10:14 AM 05/16/2019

## 2019-05-16 NOTE — Patient Instructions (Signed)
1. Abdominal wall seroma, subsequent encounter Very good healing post drainage of seroma at the right lower abdominal incision.  Very shallow wound with no sign of infection.  Wound cleaned and new packing inserted.  New dressing applied.  Patient will continue on K flex.  Follow-up with Dr. Talbert Forest on Monday, April 26.  Precautions reviewed.  Lauren Chapman, it was a pleasure meeting you today!

## 2019-05-19 ENCOUNTER — Other Ambulatory Visit: Payer: Self-pay

## 2019-05-19 ENCOUNTER — Ambulatory Visit (INDEPENDENT_AMBULATORY_CARE_PROVIDER_SITE_OTHER): Payer: Medicare Other | Admitting: Obstetrics and Gynecology

## 2019-05-19 ENCOUNTER — Encounter: Payer: Self-pay | Admitting: Obstetrics and Gynecology

## 2019-05-19 VITALS — BP 118/76

## 2019-05-19 DIAGNOSIS — S301XXD Contusion of abdominal wall, subsequent encounter: Secondary | ICD-10-CM

## 2019-05-19 DIAGNOSIS — T8131XD Disruption of external operation (surgical) wound, not elsewhere classified, subsequent encounter: Secondary | ICD-10-CM | POA: Diagnosis not present

## 2019-05-19 NOTE — Progress Notes (Signed)
   Lauren Chapman  02-25-1970 [id number]  HPI The patient is a 49 y.o. [id number] who presents today for wound repacking.  She is POD#20 following a TLH BSO on 04/29/2019.  She developed a right lower quadrant incisional seroma which was incised, drained, debrided on POD#10.  She finishes a second 7-day course of Keflex today.  Overall she is doing well, pain is controlled, tolerating normal diet, bowel and bladder function are normal.  No fever.   Physical Exam  BP 118/76   LMP 06/07/2014 (Exact Date) Comment: irregular cycles  Abdomen: Soft, nontender, nondistended.  Infraumbilical and left lower quadrant incisions are clean, dry, intact, well-healing, no erythema nor drainage.    Loose suture trimmed from the umbilical incision closure.  The right lower quadrant incision packing is removed, and there is no purulence, no necrosis, no sign of infection.  There is no active drainage.   The wound is irrigated with 10 mL of sterile saline and dried.  A single continuous moistened piece of quarter inch iodoform gauze is placed into the incision and wound cavity for a wet-to-dry wound packing change.  New dressing applied.  Assessment 49 yo G3P2 POD#20 from [facility] BSO with a RLQ incision wound seroma, here for packing change  Plan Continue wet-to-dry dressing changes every 48 hours.  Continues with very good healing.  Finishes Keflex today for infection prophylaxis.  I anticipate she will not need many more packing changes.  Follow-up with me on 05/21/2019.  Precautions reviewed.   Joseph Pierini MD 05/19/19

## 2019-05-20 ENCOUNTER — Encounter: Payer: Self-pay | Admitting: Obstetrics and Gynecology

## 2019-05-20 NOTE — Telephone Encounter (Signed)
I spoke with Dr. Delilah Shan and he feels patient will probably be fine until morning. If bleeding becomes really heavy she can go to the [facility] ER to be checked.  Patient advised.

## 2019-05-21 ENCOUNTER — Ambulatory Visit (INDEPENDENT_AMBULATORY_CARE_PROVIDER_SITE_OTHER): Payer: Medicare Other | Admitting: Obstetrics and Gynecology

## 2019-05-21 ENCOUNTER — Encounter: Payer: Self-pay | Admitting: Obstetrics and Gynecology

## 2019-05-21 ENCOUNTER — Other Ambulatory Visit: Payer: Self-pay

## 2019-05-21 VITALS — BP 120/78

## 2019-05-21 DIAGNOSIS — S301XXD Contusion of abdominal wall, subsequent encounter: Secondary | ICD-10-CM

## 2019-05-21 DIAGNOSIS — T8131XD Disruption of external operation (surgical) wound, not elsewhere classified, subsequent encounter: Secondary | ICD-10-CM | POA: Diagnosis not present

## 2019-05-21 NOTE — Progress Notes (Signed)
.  Lauren Chapman  [birth year] [id number]  HPI The patient is a 49 y.o. [id number] who presents today for wound repacking.  She is POD#22 following a TLH BSO on 04/29/2019.  She developed a right lower quadrant incisional seroma which was incised, drained, debrided on POD#10.  Overall she is doing well, pain is controlled, tolerating normal diet, bowel and bladder function are normal.  No fever.  She was at [facility] yesterday bending over to get some items on a low shelf and noticed onset of watery blood from the healing incision which did resolve over the course of the evening.  Physical Exam  BP 120/78   LMP 06/07/2014 (Exact Date) Comment: irregular cycles  Abdomen: Soft, nontender, nondistended.  Infraumbilical and left lower quadrant incisions are clean, dry, intact, well-healing, no erythema nor drainage.   The right lower quadrant incision packing is removed, and there is no purulence, no necrosis, no sign of infection.  There is no active drainage.  Granulation tissue has filled in the deep cavity of the previous seroma cavity so there is no appreciable communication with the surface portion of the wound. New dressing applied.  No more packing is required.  Assessment 49 yo G3P2 POD#22 from [facility] BSO with a RLQ incision wound seroma  Plan Continues with very good healing.  Wound no longer needs packing.  No sign of active bleeding from the incision.  I recommend that she continue to follow post operative precautions/restrictions. Follow-up with me on 05/26/2019 to ensure incision is continuing to heal well, then will have next visit at 6 week post op.   Joseph Pierini MD 05/21/19

## 2019-05-22 ENCOUNTER — Encounter: Payer: Self-pay | Admitting: Obstetrics and Gynecology

## 2019-05-22 MED ORDER — FLUCONAZOLE 150 MG PO TABS: 150.0000 mg | ORAL_TABLET | ORAL | 0 refills | Status: AC

## 2019-05-22 NOTE — Telephone Encounter (Signed)
If the stitches are bothering her she can carefully cut them but this can sometimes be hard to do with regular scissors without cutting the skin.  If any problems we could do it when we see her next. I will send her a prescription for fluconazole 150 mg by mouth repeat dose in 72 hours if still having symptoms.

## 2019-05-26 ENCOUNTER — Encounter: Payer: Self-pay | Admitting: Obstetrics and Gynecology

## 2019-05-26 ENCOUNTER — Other Ambulatory Visit: Payer: Self-pay

## 2019-05-26 ENCOUNTER — Ambulatory Visit (INDEPENDENT_AMBULATORY_CARE_PROVIDER_SITE_OTHER): Payer: Medicare Other | Admitting: Obstetrics and Gynecology

## 2019-05-26 VITALS — BP 118/76

## 2019-05-26 DIAGNOSIS — T8131XD Disruption of external operation (surgical) wound, not elsewhere classified, subsequent encounter: Secondary | ICD-10-CM

## 2019-05-26 DIAGNOSIS — S301XXD Contusion of abdominal wall, subsequent encounter: Secondary | ICD-10-CM

## 2019-05-26 DIAGNOSIS — N939 Abnormal uterine and vaginal bleeding, unspecified: Secondary | ICD-10-CM | POA: Diagnosis not present

## 2019-05-26 NOTE — Progress Notes (Signed)
.  Lauren Chapman  02-14-1970 [id number]  HPI The patient is a 49 y.o. [id number] who presents today for follow-up check of her surgical wound after a TLH BSO on [id number], complicated by a abdominal wall seroma in the right lower quadrant incision.  She completed packing and healing by secondary intention.  She comes in today as scheduled to view the incision 1 more time since the packing was removed, but also in the interim over the weekend, she did call in because she had developed vaginal bleeding after playing with her cat and bending down to give the animal some water.  She described bright red vaginal bleeding requiring her to wear a pad which has since slowed but she is still having a little bleeding today.  She was told to rest and monitor for any worsening of the bleeding and to call back if it did get worse but she did not notice any worsening of the problem.   Physical Exam  BP 118/76   LMP 06/07/2014 (Exact Date) Comment: irregular cycles  Abdomen: Soft, nontender, nondistended.  Infraumbilical and left lower quadrant incisions are clean, dry, intact, well-healing, no erythema nor drainage.   The right lower quadrant incision healing well and there is no purulence, no necrosis, no sign of infection.  There is no active drainage.  The wound is mostly filled in with granulation tissue.  Surrounding dressing is removed and replaced with a regular bandage. Pelvic: Vaginal speculum exam performed without any discomfort, 1 teaspoon dark red blood present in the upper vaginal vault, vaginal cuff appears intact and well-healed.  Palpation elicits no discomfort or defect. Caryn Bee present for examination  Assessment 49 yo G3P2 POD#27 from [facility] BSO with a healed RLQ incision wound seroma, new onset vaginal bleeding with resolution  Plan Incision continues to heal very well, she may use bandages at this time instead of a whole dressing.  Vaginal exam reveals reassuring findings today without  evidence of cuff dehiscence or active bleeding.  I encouraged her to continue to follow postoperative restrictions and to avoid any bending/crouching/twisting at the waist maneuvers.  Next visit at 6 week post op unless any issues arise before then.   Joseph Pierini MD 05/26/19

## 2019-06-06 ENCOUNTER — Other Ambulatory Visit: Payer: Self-pay

## 2019-06-09 ENCOUNTER — Encounter: Payer: Self-pay | Admitting: Obstetrics and Gynecology

## 2019-06-09 ENCOUNTER — Other Ambulatory Visit: Payer: Self-pay

## 2019-06-09 ENCOUNTER — Ambulatory Visit (INDEPENDENT_AMBULATORY_CARE_PROVIDER_SITE_OTHER): Payer: Medicare Other | Admitting: Obstetrics and Gynecology

## 2019-06-09 VITALS — BP 118/76

## 2019-06-09 DIAGNOSIS — Z9079 Acquired absence of other genital organ(s): Secondary | ICD-10-CM

## 2019-06-09 DIAGNOSIS — Z90722 Acquired absence of ovaries, bilateral: Secondary | ICD-10-CM

## 2019-06-09 DIAGNOSIS — Z48816 Encounter for surgical aftercare following surgery on the genitourinary system: Secondary | ICD-10-CM

## 2019-06-09 DIAGNOSIS — Z90710 Acquired absence of both cervix and uterus: Secondary | ICD-10-CM

## 2019-06-09 NOTE — Progress Notes (Signed)
   Lauren Chapman [birth year] [id number]  SUBJECTIVE   REASON FOR APPOINTMENT Chief Complaint  Patient presents with  . Post Op check     HISTORY OF PRESENT ILLNESS Lauren Chapman presents for routine 6 week post-operative follow up for a total laparoscopic hysterectomy, bilateral salpingo-oophorectomy on 04/29/2019 performed for persistent postmenopausal bleeding.  Pathology report from surgery was benign.  Her postoperative course was notable for an abdominal wall seroma from the RLQ incision, which was treated by incision and drainage, oral antibiotic prophylaxis, and the wound healed by secondary intention with frequent packing and wet-to-dry dressing changes.  She also had an episode of vaginal bleeding about 4 weeks into her postoperative course after she engaged in some physical activity.  The vaginal cuff was evaluated and was normal, and the bleeding did resolve on its own. The patient is doing well at this point with no concerns.  She denies vaginal bleeding or discharge. She is tolerating a normal diet.  Bowel and bladder function are normal.  Her fatigue levels have improved and she is feeling more energetic.    OBJECTIVE  BP 118/76   LMP 06/07/2014 (Exact Date) Comment: irregular cycles   PHYSICAL EXAM General:  Patient is no acute distress she is alert and oriented Abdomen:  Soft, nontender, nondistended.  No mass, rebound, nor guarding is appreciated.  Incisions are clean dry and intact x3 without drainage nor erythema.  RLQ incision is well-healed with resolving scab and no evidence of infection or drainage. Pelvic:  External genitalia appear to be within normal limits consistent with postmenopausal status.  Bartholin's and Skene glands and urethra appear to be within normal limits.  There are no lesions on the labia or vaginal tissues.  Vaginal epithelium is moist, pink.  There is no vaginal bleeding or abnormal discharge. Vaginal cuff appears intact without visible suture line or  granulation tissue.  Bimanual exam indicates that the vaginal cuff is intact and without tenderness.  There is no adnexal mass, fullness, nor tenderness.  She specifically indicates that on the exam at the last visit on 05/26/2019 and today that the speculum exam did not cause her the discomfort that she had had prior to the hysterectomy.  Caryn Bee is present during the exam   ASSESSMENT / PLAN  Routine 6 week post operative check.   The patient is doing well and meeting all postoperative milestones.  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  After 2 more weeks if still doing well she may resume these activities gradually as well and may resume usual gynecologic care from this point forward.   Joseph Pierini MD 06/09/19

## 2019-06-11 DIAGNOSIS — F250 Schizoaffective disorder, bipolar type: Secondary | ICD-10-CM | POA: Diagnosis not present

## 2019-06-13 ENCOUNTER — Telehealth (INDEPENDENT_AMBULATORY_CARE_PROVIDER_SITE_OTHER): Payer: Medicare Other | Admitting: Family Medicine

## 2019-06-13 ENCOUNTER — Encounter: Payer: Self-pay | Admitting: Family Medicine

## 2019-06-13 ENCOUNTER — Other Ambulatory Visit: Payer: Self-pay

## 2019-06-13 DIAGNOSIS — G8929 Other chronic pain: Secondary | ICD-10-CM

## 2019-06-13 DIAGNOSIS — M25552 Pain in left hip: Secondary | ICD-10-CM | POA: Diagnosis not present

## 2019-06-13 MED ORDER — PREDNISONE 20 MG PO TABS: 40.0000 mg | ORAL_TABLET | Freq: Every day | ORAL | 0 refills | Status: DC

## 2019-06-13 NOTE — Patient Instructions (Signed)
° ° ° °  If you have lab work done today you will be contacted with your lab results within the next 2 weeks.  If you have not heard from us then please contact us. The fastest way to get your results is to register for My Chart. ° ° °IF you received an x-ray today, you will receive an invoice from [facility]. Please contact [facility] at [phone] with questions or concerns regarding your invoice.  ° °IF you received labwork today, you will receive an invoice from [facility]. Please contact [facility] at [phone] with questions or concerns regarding your invoice.  ° °Our billing staff will not be able to assist you with questions regarding bills from these companies. ° °You will be contacted with the lab results as soon as they are available. The fastest way to get your results is to activate your My Chart account. Instructions are located on the last page of this paperwork. If you have not heard from us regarding the results in 2 weeks, please contact this office. °  ° ° ° °

## 2019-06-13 NOTE — Progress Notes (Signed)
Virtual Visit Note  I connected with patient on 06/13/19 at 529pm by video epic and verified that I am speaking with the correct person using two identifiers. Lauren Chapman is currently located at home and patient is currently with them during visit. The provider, Rutherford Guys, MD is located in their office at time of visit.  I discussed the limitations, risks, security and privacy concerns of performing an evaluation and management service by telephone and the availability of in person appointments. I also discussed with the patient that there may be a patient responsible charge related to this service. The patient expressed understanding and agreed to proceed.   I provided 8 minutes of non-face-to-face time during this encounter.  Chief Complaint  Patient presents with  . left leg pain x several months    starts in lower hip and radiates into left side of leg into shin, ankle and calf halfway not all around the calf per pt.  Pain is sharp when lying on left side and she can't lie on her left side longer than 1-2 mins w/o pain becoming excruciating and takes while for the pain to resolve.  Patient takes ibuprofen and it helps sometimes.  Per woke this morning unable to walk.  Pain level 9/10    HPI ? Having left hip pain mostly when she lies on her let side from lower hip to the end of her leg Hardly ever hurts during the day Now pain is worse sooner upon lying down  Denies any trauma Pain has been present for months She is worried about having bone cancer Denies any swelling, redness or warmth ibuprofen prn helps Has not been icing  Allergies  Allergen Reactions  . Codeine Anaphylaxis    Anything with codeine    Prior to Admission medications   Medication Sig Start Date End Date Taking? Authorizing Provider  albuterol (PROVENTIL HFA;VENTOLIN HFA) 108 (90 Base) MCG/ACT inhaler Inhale 2 puffs into the lungs every 4 (four) hours as needed for wheezing or shortness of  breath. 01/30/18  Yes Olalere, Adewale A, MD  clonazePAM (KLONOPIN) 0.5 MG tablet Take 0.5 mg by mouth 3 (three) times daily.  12/04/17  Yes [provider]  docusate sodium (COLACE) 100 MG capsule Take 1 capsule (100 mg total) by mouth 2 (two) times daily. Hold for loose stool 04/29/19  Yes Joseph Pierini, MD  esomeprazole (NEXIUM) 20 MG capsule Take 1 capsule (20 mg total) by mouth 2 (two) times daily before a meal. 03/21/19  Yes Rutherford Guys, MD  gabapentin (NEURONTIN) 800 MG tablet Take 800 mg by mouth 3 (three) times daily.  07/02/18  Yes [provider]  lamoTRIgine (LAMICTAL) 150 MG tablet Take 150 mg by mouth 2 (two) times daily.    Yes [provider]  mirtazapine (REMERON) 15 MG tablet Take 15 mg by mouth at bedtime.   Yes [provider]  ondansetron (ZOFRAN) 4 MG tablet Take 1 tablet (4 mg total) by mouth every 8 (eight) hours as needed for nausea or vomiting. 04/03/19  Yes Marcial Pacas, MD  rizatriptan (MAXALT) 10 MG tablet Take 1 tab at onset of migraine.  May repeat in 2 hrs, if needed.  Max dose: 2 tabs/day. This is a 30 day prescription. Patient taking differently: Take 10 mg by mouth as needed for migraine. May repeat in 2 hrs, if needed.  Max dose: 2 tabs/day. This is a 30 day prescription. 04/03/19  Yes Marcial Pacas, MD  sertraline (  ZOLOFT) 100 MG tablet Take 200 mg by mouth daily.    Yes [provider]  tiZANidine (ZANAFLEX) 4 MG tablet Take 1 tablet (4 mg total) by mouth every 6 (six) hours as needed for muscle spasms. TAKE 1 TABLET BY MOUTH EVERY 6 HOURS AS NEEDED FOR MUSCLE SPASMS. 04/03/19  Yes Marcial Pacas, MD  VRAYLAR 4.5 MG CAPS Take 4.5 mg by mouth at bedtime.  04/17/18  Yes [provider]  Fremanezumab-vfrm (AJOVY) 225 MG/1.5ML SOSY Inject 225 mg into the skin every 30 (thirty) days. Patient not taking: Reported on 06/13/2019 04/03/19   Marcial Pacas, MD  fluticasone [facility]) 50 MCG/ACT nasal spray Place 2 sprays into both nostrils  daily. Patient not taking: Reported on 09/26/2018 09/24/18 10/22/18  Rutherford Guys, MD    Past Medical History:  Diagnosis Date  . Anxiety   . Bipolar affective psychosis ([place])   . Complication of anesthesia    " Acting silly- waving at everyone"-Giddy  . Depression   . Emphysema lung ([facility])    inhaler prn,  followed by pcp and pulmonologist-- dr Margorie John  . Family history of adverse reaction to anesthesia    Father had rheumatic fever, and died on operating table.  Marland Kitchen GERD (gastroesophageal reflux disease)   . Migraine   . Smokers' cough ([place])    per pt not productive  . SUI (stress urinary incontinence, female)   . Wears glasses     Past Surgical History:  Procedure Laterality Date  . CARPAL TUNNEL RELEASE Right 09-12-2007   @[email or web address]    and GANGLION CYST EXCISION  . CYSTOSCOPY N/A 04/29/2019   Procedure: CYSTOSCOPY;  Surgeon: Joseph Pierini, MD;  Location: [facility];  Service: Gynecology;  Laterality: N/A;  . DILATATION & CURETTAGE/HYSTEROSCOPY WITH MYOSURE N/A 11/13/2018   Procedure: [place];  Surgeon: Anastasio Auerbach, MD;  Location: [facility];  Service: Gynecology;  Laterality: N/A;  request 9:00am OR start time in [place] [facility] block requests one hour  . DILATION AND CURETTAGE OF UTERUS  02/2019  . GANGLION CYST EXCISION     R wrist  . KNEE ARTHROSCOPY WITH ANTERIOR CRUCIATE LIGAMENT (ACL) REPAIR Right 10/2015  . LAPAROSCOPIC CHOLECYSTECTOMY  1997  . LAPAROSCOPIC HYSTERECTOMY Bilateral 04/29/2019   Procedure: HYSTERECTOMY TOTAL LAPAROSCOPIC, BILATERAL SALPINO-OOPHORECTOMY;  Surgeon: Joseph Pierini, MD;  Location: [facility];  Service: Gynecology;  Laterality: Bilateral;  . TOOTH EXTRACTION  04/01/2019    Social History   Tobacco Use  . Smoking status: Current Every Day Smoker    Packs/day: 0.50    Years: 33.00    Pack years: 16.50    Types: Cigarettes    Start date: 10/10/1981   . Smokeless tobacco: Never Used  . Tobacco comment: 04/03/19   per pt down to 4-5 cig per day  Substance Use Topics  . Alcohol use: Never    Alcohol/week: 0.0 standard drinks    Family History  Problem Relation Age of Onset  . Depression Mother   . Cancer Mother        Cervical  . Alcohol abuse Father   . Crohn's disease Sister   . Stroke Brother 25  . Migraines Daughter   . GER disease Daughter   . Depression Daughter   . Migraines Daughter   . GER disease Daughter     ROS Per hpi  Objective  Vitals as reported by the patient: none  GEN: AAOx3, NAD HEENT: [place]/AT, pupils are  symmetrical, EOMI, non-icteric sclera Resp: breathing comfortably, speaking in full sentences Skin: no rashes noted, no pallor Psych: good eye contact, normal mood and affect   ASSESSMENT and PLAN  1. Chronic left hip pain Bursitis? Discussed ice, not lying on that side, pred rx. Reviewed r/se/b. If not better in ~  2 weeks, appt in clinic for xrays and further eval  Other orders - predniSONE (DELTASONE) 20 MG tablet; Take 2 tablets (40 mg total) by mouth daily with breakfast.  FOLLOW-UP: prn   The above assessment and management plan was discussed with the patient. The patient verbalized understanding of and has agreed to the management plan. Patient is aware to call the clinic if symptoms persist or worsen. Patient is aware when to return to the clinic for a follow-up visit. Patient educated on when it is appropriate to go to the emergency department.     Rutherford Guys, MD Primary Care at [place], [place] [address] Ph.  [phone] Fax [phone]

## 2019-06-17 DIAGNOSIS — F250 Schizoaffective disorder, bipolar type: Secondary | ICD-10-CM | POA: Diagnosis not present

## 2019-06-20 ENCOUNTER — Encounter: Payer: Self-pay | Admitting: Family Medicine

## 2019-06-20 ENCOUNTER — Other Ambulatory Visit: Payer: Self-pay

## 2019-06-20 ENCOUNTER — Ambulatory Visit (INDEPENDENT_AMBULATORY_CARE_PROVIDER_SITE_OTHER): Payer: Medicare Other | Admitting: Family Medicine

## 2019-06-20 VITALS — BP 110/74 | HR 75 | Temp 98.4°F | Ht 62.6 in | Wt 181.6 lb

## 2019-06-20 DIAGNOSIS — M7632 Iliotibial band syndrome, left leg: Secondary | ICD-10-CM

## 2019-06-20 DIAGNOSIS — M7062 Trochanteric bursitis, left hip: Secondary | ICD-10-CM | POA: Diagnosis not present

## 2019-06-20 MED ORDER — METHOCARBAMOL 500 MG PO TABS: 500.0000 mg | ORAL_TABLET | Freq: Three times a day (TID) | ORAL | 0 refills | Status: DC | PRN

## 2019-06-20 MED ORDER — MELOXICAM 15 MG PO TABS: 15.0000 mg | ORAL_TABLET | Freq: Every day | ORAL | 0 refills | Status: DC

## 2019-06-20 NOTE — Patient Instructions (Addendum)
If you have lab work done today you will be contacted with your lab results within the next 2 weeks.  If you have not heard from [place] then please contact us. The fastest way to get your results is to register for My Chart.   IF you received an x-ray today, you will receive an invoice from [facility]. Please contact [facility] at [phone] with questions or concerns regarding your invoice.   IF you received labwork today, you will receive an invoice from [facility]. Please contact [facility] at [phone] with questions or concerns regarding your invoice.   Our billing staff will not be able to assist you with questions regarding bills from these companies.  You will be contacted with the lab results as soon as they are available. The fastest way to get your results is to activate your My Chart account. Instructions are located on the last page of this paperwork. If you have not heard from [place] regarding the results in 2 weeks, please contact this office.     Hip Bursitis Rehab Ask your health care provider which exercises are safe for you. Do exercises exactly as told by your health care provider and adjust them as directed. It is normal to feel mild stretching, pulling, tightness, or discomfort as you do these exercises. Stop right away if you feel sudden pain or your pain gets worse. Do not begin these exercises until told by your health care provider. Stretching exercise This exercise warms up your muscles and joints and improves the movement and flexibility of your hip. This exercise also helps to relieve pain and stiffness. Iliotibial band stretch An iliotibial band is a strong band of muscle tissue that runs from the outer side of your hip to the outer side of your thigh and knee. 1. Lie on your side with your left / right leg in the top position. 2. Bend your left / right knee and grab your ankle. Stretch out your bottom arm to help you balance. 3. Slowly bring  your knee back so your thigh is behind your body. 4. Slowly lower your knee toward the floor until you feel a gentle stretch on the outside of your left / right thigh. If you do not feel a stretch and your knee will not fall farther, place the heel of your other foot on top of your knee and pull your knee down toward the floor with your foot. 5. Hold this position for __________ seconds. 6. Slowly return to the starting position. Repeat __________ times. Complete this exercise __________ times a day. Strengthening exercises These exercises build strength and endurance in your hip and pelvis. Endurance is the ability to use your muscles for a long time, even after they get tired. Bridge This exercise strengthens the muscles that move your thigh backward (hip extensors). 1. Lie on your back on a firm surface with your knees bent and your feet flat on the floor. 2. Tighten your buttocks muscles and lift your buttocks off the floor until your trunk is level with your thighs. ? Do not arch your back. ? You should feel the muscles working in your buttocks and the back of your thighs. If you do not feel these muscles, slide your feet 1-2 inches (2.5-5 cm) farther away from your buttocks. ? If this exercise is too easy, try doing it with your arms crossed over your chest. 3. Hold this position for __________ seconds. 4. Slowly lower your hips to the starting  position. 5. Let your muscles relax completely after each repetition. Repeat __________ times. Complete this exercise __________ times a day. Squats This exercise strengthens the muscles in front of your thigh and knee (quadriceps). 1. Stand in front of a table, with your feet and knees pointing straight ahead. You may rest your hands on the table for balance but not for support. 2. Slowly bend your knees and lower your hips like you are going to sit in a chair. ? Keep your weight over your heels, not over your toes. ? Keep your lower legs upright  so they are parallel with the table legs. ? Do not let your hips go lower than your knees. ? Do not bend lower than told by your health care provider. ? If your hip pain increases, do not bend as low. 3. Hold the squat position for __________ seconds. 4. Slowly push with your legs to return to standing. Do not use your hands to pull yourself to standing. Repeat __________ times. Complete this exercise __________ times a day. Hip hike 1. Stand sideways on a bottom step. Stand on your left / right leg with your other foot unsupported next to the step. You can hold on to the railing or wall for balance if needed. 2. Keep your knees straight and your torso square. Then lift your left / right hip up toward the ceiling. 3. Hold this position for __________ seconds. 4. Slowly let your left / right hip lower toward the floor, past the starting position. Your foot should get closer to the floor. Do not lean or bend your knees. Repeat __________ times. Complete this exercise __________ times a day. Single leg stand 1. Without shoes, stand near a railing or in a doorway. You may hold on to the railing or door frame as needed for balance. 2. Squeeze your left / right buttock muscles, then lift up your other foot. ? Do not let your left / right hip push out to the side. ? It is helpful to stand in front of a mirror for this exercise so you can watch your hip. 3. Hold this position for __________ seconds. Repeat __________ times. Complete this exercise __________ times a day. This information is not intended to replace advice given to you by your health care provider. Make sure you discuss any questions you have with your health care provider. Document Revised: 05/06/2018 Document Reviewed: 05/06/2018 Elsevier Patient Education  [place] Band Syndrome Rehab Ask your health care provider which exercises are safe for you. Do exercises exactly as told by your health care provider and  adjust them as directed. It is normal to feel mild stretching, pulling, tightness, or discomfort as you do these exercises. Stop right away if you feel sudden pain or your pain gets significantly worse. Do not begin these exercises until told by your health care provider. Stretching and range-of-motion exercises These exercises warm up your muscles and joints and improve the movement and flexibility of your hip and pelvis. Quadriceps stretch, prone  1. Lie on your abdomen on a firm surface, such as a bed or padded floor (prone position). 2. Bend your left / right knee and reach back to hold your ankle or pant leg. If you cannot reach your ankle or pant leg, loop a belt around your foot and grab the belt instead. 3. Gently pull your heel toward your buttocks. Your knee should not slide out to the side. You should feel a stretch in the  front of your thigh and knee (quadriceps). 4. Hold this position for __________ seconds. Repeat __________ times. Complete this exercise __________ times a day. Iliotibial band stretch An iliotibial band is a strong band of muscle tissue that runs from the outer side of your hip to the outer side of your thigh and knee. 1. Lie on your side with your left / right leg in the top position. 2. Bend both of your knees and grab your left / right ankle. Stretch out your bottom arm to help you balance. 3. Slowly bring your top knee back so your thigh goes behind your trunk. 4. Slowly lower your top leg toward the floor until you feel a gentle stretch on the outside of your left / right hip and thigh. If you do not feel a stretch and your knee will not fall farther, place the heel of your other foot on top of your knee and pull your knee down toward the floor with your foot. 5. Hold this position for __________ seconds. Repeat __________ times. Complete this exercise __________ times a day. Strengthening exercises These exercises build strength and endurance in your hip and  pelvis. Endurance is the ability to use your muscles for a long time, even after they get tired. Straight leg raises, side-lying This exercise strengthens the muscles that rotate the leg at the hip and move it away from your body (hip abductors). 1. Lie on your side with your left / right leg in the top position. Lie so your head, shoulder, hip, and knee line up. You may bend your bottom knee to help you balance. 2. Roll your hips slightly forward so your hips are stacked directly over each other and your left / right knee is facing forward. 3. Tense the muscles in your outer thigh and lift your top leg 4-6 inches (10-15 cm). 4. Hold this position for __________ seconds. 5. Slowly return to the starting position. Let your muscles relax completely before doing another repetition. Repeat __________ times. Complete this exercise __________ times a day. Leg raises, prone This exercise strengthens the muscles that move the hips (hip extensors). 1. Lie on your abdomen on your bed or a firm surface. You can put a pillow under your hips if that is more comfortable for your lower back. 2. Bend your left / right knee so your foot is straight up in the air. 3. Squeeze your buttocks muscles and lift your left / right thigh off the bed. Do not let your back arch. 4. Tense your thigh muscle as hard as you can without increasing any knee pain. 5. Hold this position for __________ seconds. 6. Slowly lower your leg to the starting position and allow it to relax completely. Repeat __________ times. Complete this exercise __________ times a day. Hip hike 1. Stand sideways on a bottom step. Stand on your left / right leg with your other foot unsupported next to the step. You can hold on to the railing or wall for balance if needed. 2. Keep your knees straight and your torso square. Then lift your left / right hip up toward the ceiling. 3. Slowly let your left / right hip lower toward the floor, past the starting  position. Your foot should get closer to the floor. Do not lean or bend your knees. Repeat __________ times. Complete this exercise __________ times a day. This information is not intended to replace advice given to you by your health care provider. Make sure you discuss any questions  you have with your health care provider. Document Revised: 05/02/2018 Document Reviewed: 10/31/2017 Elsevier Patient Education  [place].

## 2019-06-20 NOTE — Progress Notes (Signed)
5/28/20213:06 PM  Lauren Chapman 23-Sep-1970, 49 y.o., female [id number]  Chief Complaint  Patient presents with  . Pain    pain in becoming worse in the knee, continuous of the 5/21 encounter. Was looking ot have imaging done today. Agrees to come bk for nurse visit if suggested.    HPI:   Patient is a 49 y.o. female who presents today for left hip/knee pain  Had telemedicine visit a week ago, bursitis? rx for pred  Prednisone was helping but now pain is back Has been doing ibuprofen and ice left hip down lateral thigh to knee Worse with going up and down stairs Cant sleep on her left side No injuries or falls  tizanadine is rx for migraines  Depression screen [facility] 2/9 06/13/2019 03/21/2019 03/17/2019  Decreased Interest 0 1 1  Down, Depressed, Hopeless 0 1 1  PHQ - 2 Score 0 2 2  Altered sleeping - 0 0  Tired, decreased energy - 1 0  Change in appetite - 0 0  Feeling bad or failure about yourself  - 1 1  Trouble concentrating - 0 0  Moving slowly or fidgety/restless - 0 0  Suicidal thoughts - 0 0  PHQ-9 Score - 4 3  Difficult doing work/chores - - Somewhat difficult  Some recent data might be hidden    Fall Risk  06/20/2019 06/13/2019 03/21/2019 03/17/2019 11/05/2018  Falls in the past year? 0 0 1 1 1   Comment - - - - -  Number falls in past yr: 0 0 0 0 0  Comment - - - - -  Injury with Fall? 0 1 1 1 1   Comment - broke a front tooth and back tooth w/ bruises to face - broke 3 teeth, shoulder bruised. fell down steps 6 months ago  Risk for fall due to : - - - - -  Risk for fall due to: Comment - - - - -  Follow up - Falls evaluation completed Falls evaluation completed Falls evaluation completed;Education provided -     Allergies  Allergen Reactions  . Codeine Anaphylaxis    Anything with codeine    Prior to Admission medications   Medication Sig Start Date End Date Taking? Authorizing Provider  albuterol (PROVENTIL HFA;VENTOLIN HFA) 108 (90 Base) MCG/ACT  inhaler Inhale 2 puffs into the lungs every 4 (four) hours as needed for wheezing or shortness of breath. 01/30/18  Yes Olalere, Adewale A, MD  clonazePAM (KLONOPIN) 0.5 MG tablet Take 0.5 mg by mouth 3 (three) times daily.  12/04/17  Yes [provider]  docusate sodium (COLACE) 100 MG capsule Take 1 capsule (100 mg total) by mouth 2 (two) times daily. Hold for loose stool 04/29/19  Yes Joseph Pierini, MD  esomeprazole (NEXIUM) 20 MG capsule Take 1 capsule (20 mg total) by mouth 2 (two) times daily before a meal. 03/21/19  Yes Rutherford Guys, MD  Fremanezumab-vfrm (AJOVY) 225 MG/1.5ML SOSY Inject 225 mg into the skin every 30 (thirty) days. 04/03/19  Yes Marcial Pacas, MD  gabapentin (NEURONTIN) 800 MG tablet Take 800 mg by mouth 3 (three) times daily.  07/02/18  Yes [provider]  lamoTRIgine (LAMICTAL) 150 MG tablet Take 150 mg by mouth 2 (two) times daily.    Yes [provider]  mirtazapine (REMERON) 15 MG tablet Take 15 mg by mouth at bedtime.   Yes [provider]  ondansetron (ZOFRAN) 4 MG tablet Take 1 tablet (4 mg total) by mouth  every 8 (eight) hours as needed for nausea or vomiting. 04/03/19  Yes Marcial Pacas, MD  rizatriptan (MAXALT) 10 MG tablet Take 1 tab at onset of migraine.  May repeat in 2 hrs, if needed.  Max dose: 2 tabs/day. This is a 30 day prescription. Patient taking differently: Take 10 mg by mouth as needed for migraine. May repeat in 2 hrs, if needed.  Max dose: 2 tabs/day. This is a 30 day prescription. 04/03/19  Yes Marcial Pacas, MD  sertraline (ZOLOFT) 100 MG tablet Take 200 mg by mouth daily. Taking 3 times a day   Yes [provider]  tiZANidine (ZANAFLEX) 4 MG tablet Take 1 tablet (4 mg total) by mouth every 6 (six) hours as needed for muscle spasms. TAKE 1 TABLET BY MOUTH EVERY 6 HOURS AS NEEDED FOR MUSCLE SPASMS. 04/03/19  Yes Marcial Pacas, MD  VRAYLAR 6 MG CAPS Take 1 capsule by mouth daily. 06/17/19  Yes [provider]    fluticasone (FLONASE) 50 MCG/ACT nasal spray Place 2 sprays into both nostrils daily. Patient not taking: Reported on 09/26/2018 09/24/18 10/22/18  Rutherford Guys, MD    Past Medical History:  Diagnosis Date  . Anxiety   . Bipolar affective psychosis ([place])   . Complication of anesthesia    " Acting silly- waving at everyone"-Giddy  . Depression   . Emphysema lung ([facility])    inhaler prn,  followed by pcp and pulmonologist-- dr Margorie John  . Family history of adverse reaction to anesthesia    Father had rheumatic fever, and died on operating table.  Marland Kitchen GERD (gastroesophageal reflux disease)   . Migraine   . Smokers' cough ([place])    per pt not productive  . SUI (stress urinary incontinence, female)   . Wears glasses     Past Surgical History:  Procedure Laterality Date  . CARPAL TUNNEL RELEASE Right 09-12-2007   @[email or web address]    and GANGLION CYST EXCISION  . CYSTOSCOPY N/A 04/29/2019   Procedure: CYSTOSCOPY;  Surgeon: Joseph Pierini, MD;  Location: [facility];  Service: Gynecology;  Laterality: N/A;  . DILATATION & CURETTAGE/HYSTEROSCOPY WITH MYOSURE N/A 11/13/2018   Procedure: [place];  Surgeon: Anastasio Auerbach, MD;  Location: [place];  Service: Gynecology;  Laterality: N/A;  request 9:00am OR start time in [place] G[facility] block requests one hour  . DILATION AND CURETTAGE OF UTERUS  02/2019  . GANGLION CYST EXCISION     R wrist  . KNEE ARTHROSCOPY WITH ANTERIOR CRUCIATE LIGAMENT (ACL) REPAIR Right 10/2015  . LAPAROSCOPIC CHOLECYSTECTOMY  1997  . LAPAROSCOPIC HYSTERECTOMY Bilateral 04/29/2019   Procedure: HYSTERECTOMY TOTAL LAPAROSCOPIC, BILATERAL SALPINO-OOPHORECTOMY;  Surgeon: Joseph Pierini, MD;  Location: [place];  Service: Gynecology;  Laterality: Bilateral;  . TOOTH EXTRACTION  04/01/2019    Social History   Tobacco Use  . Smoking status: Current Every Day Smoker    Packs/day: 0.50     Years: 33.00    Pack years: 16.50    Types: Cigarettes    Start date: 10/10/1981  . Smokeless tobacco: Never Used  . Tobacco comment: 04/03/19   per pt down to 4-5 cig per day  Substance Use Topics  . Alcohol use: Never    Alcohol/week: 0.0 standard drinks    Family History  Problem Relation Age of Onset  . Depression Mother   . Cancer Mother        Cervical  . Alcohol abuse Father   .  Crohn's disease Sister   . Stroke Brother 63  . Migraines Daughter   . GER disease Daughter   . Depression Daughter   . Migraines Daughter   . GER disease Daughter     ROS Per hpi  OBJECTIVE:  Today's Vitals   06/20/19 1500  BP: 110/74  Pulse: 75  Temp: 98.4 F (36.9 C)  SpO2: 96%  Weight: 181 lb 9.6 oz (82.4 kg)  Height: 5' 2.6" (1.59 m)   Body mass index is 32.58 kg/m.   Physical Exam Vitals and nursing note reviewed.  Constitutional:      Appearance: She is well-developed.  HENT:     Head: Normocephalic and atraumatic.  Eyes:     General: No scleral icterus.    Conjunctiva/sclera: Conjunctivae normal.     Pupils: Pupils are equal, round, and reactive to light.  Pulmonary:     Effort: Pulmonary effort is normal.  Musculoskeletal:     Cervical back: Neck supple.     Left hip: Tenderness (GT and IT band) present. No bony tenderness. Normal range of motion. Normal strength.  Skin:    General: Skin is warm and dry.  Neurological:     Mental Status: She is alert and oriented to person, place, and time.       No results found for this or any previous visit (from the past 24 hour(s)).  No results found.   ASSESSMENT and PLAN  1. Greater trochanteric bursitis of left hip 2. Iliotibial band syndrome of left side - Ambulatory referral to Sports Medicine Discussed supportive measures, new meds r/se/b and RTC precautions. Patient educational handout given.  Other orders - meloxicam (MOBIC) 15 MG tablet; Take 1 tablet (15 mg total) by mouth daily. - methocarbamol  (ROBAXIN) 500 MG tablet; Take 1 tablet (500 mg total) by mouth every 8 (eight) hours as needed for muscle spasms.  Return if symptoms worsen or fail to improve.    Rutherford Guys, MD [facility] [place], [place] [address] Ph.  [phone] Fax [phone]

## 2019-07-01 ENCOUNTER — Other Ambulatory Visit: Payer: Self-pay

## 2019-07-01 ENCOUNTER — Ambulatory Visit (INDEPENDENT_AMBULATORY_CARE_PROVIDER_SITE_OTHER): Payer: Medicare Other | Admitting: Sports Medicine

## 2019-07-01 VITALS — BP 118/82 | Ht 62.5 in | Wt 180.0 lb

## 2019-07-01 DIAGNOSIS — G8929 Other chronic pain: Secondary | ICD-10-CM | POA: Diagnosis not present

## 2019-07-01 DIAGNOSIS — M25562 Pain in left knee: Secondary | ICD-10-CM | POA: Diagnosis not present

## 2019-07-01 DIAGNOSIS — M25552 Pain in left hip: Secondary | ICD-10-CM

## 2019-07-01 NOTE — Progress Notes (Signed)
° °  Subjective:    Patient ID: Lauren Chapman, female    DOB: 1970/11/13, 49 y.o.   MRN: [id number]  HPI chief complaint: Left hip and left knee pain  49 year old female comes in today with a couple of different complaints.  She is complaining of lateral left hip pain which is worse at night when lying on her left side.  She describes a burning pain along the lateral hip that will radiate down the lateral leg past the knee into the anterior lateral lower leg.  Her symptoms resolve if she rolls over onto her right side.  She has no significant discomfort during the day.  She is also complaining of anterior left knee pain which is worse with going up and down stairs.  She is in the process of moving and has been squatting more than usual.  She denies any swelling.  No prior left knee surgery.  She is status post right knee ACL reconstruction 2 years ago and doing very well.  She denies groin pain.  Her primary care physician placed her on meloxicam and methocarbamol.  Past medical history reviewed Medications reviewed.  Of note, she takes gabapentin daily Allergies reviewed    Review of Systems As above    Objective:   Physical Exam  Well-developed, well-nourished.  No acute distress.  Awake alert and oriented x3.  Left hip: Smooth painless hip range of motion with a negative logroll.  She is tender to palpation diffusely along the lateral hip.  Weakness secondary to pain.  Left knee: Good range of motion.  No effusion.  Trace patellofemoral crepitus.  She does have diffuse tenderness to palpation along the anterior aspect of the knee but nothing focal.  Knee is grossly stable ligamentous exam.  Neurological exam shows no obvious atrophy of either lower extremity.  Reflexes are brisk and equal at the Achilles and patellar tendons.      Assessment & Plan:   Left hip pain likely secondary to greater trochanteric bursitis Left knee pain likely secondary to mild patellofemoral  DJD  Although her left hip pain is not classic greater trochanteric bursitis I would like for her to start physical therapy at [facility] outpatient [facility].  We will also order physical therapy for her left knee and she will follow-up with me in 4 weeks for reevaluation.  We will also fit her with a compression sleeve for the left knee to wear when active.  She will discontinue her meloxicam if she does not find it helpful but the methocarbamol does help her sleep at night.  She will call me with questions or concerns prior to her follow-up visit.

## 2019-07-03 ENCOUNTER — Ambulatory Visit (INDEPENDENT_AMBULATORY_CARE_PROVIDER_SITE_OTHER): Payer: Medicare Other | Admitting: Neurology

## 2019-07-03 ENCOUNTER — Encounter: Payer: Self-pay | Admitting: Neurology

## 2019-07-03 ENCOUNTER — Other Ambulatory Visit: Payer: Self-pay

## 2019-07-03 VITALS — BP 122/82 | HR 82 | Ht 62.5 in | Wt 181.0 lb

## 2019-07-03 DIAGNOSIS — IMO0002 Reserved for concepts with insufficient information to code with codable children: Secondary | ICD-10-CM

## 2019-07-03 DIAGNOSIS — G43709 Chronic migraine without aura, not intractable, without status migrainosus: Secondary | ICD-10-CM

## 2019-07-03 MED ORDER — RIZATRIPTAN BENZOATE 10 MG PO TABS: ORAL_TABLET | ORAL | 6 refills | Status: DC

## 2019-07-03 NOTE — Progress Notes (Signed)
PATIENT: Lauren Chapman DOB: 1970/12/04  REASON FOR VISIT: follow up HISTORY FROM: patient  HISTORY OF PRESENT ILLNESS: Today 07/03/19  HISTORY Lauren Chapman a 49 year old right-handed female, seen in refer by her primary care Bettey Costa, NP for evaluation of migraine in October 19 2014  I reviewed and summarized her office visit, reviewed laboratory in July 2016, mild elevated triglycerides 208,  She has a history of bipolar affective disorder, is on polypharmacy treatment, this including lamotrigine, Zoloft, Brintellix, Valium, Cogentin, gabapentin.  She started to have migraine since 49 years old, her typical migraine are lateralized severe pounding headache was associated light noise sensitivity, lasting 1-2 days, she often has alternating of sound, visual disturbance before the onset of migraine, and during severe headaches. over the years, she has had at least twice migraine each week, she has been taking frequent goody powders, which does not work anymore, now she take over-the-counter ibuprofen and Aleve, up to 10 tablets each day to try to get rid of headache with suboptimal response. She was giving a prescription of Imitrex upon why ER presentation for her migraines, which does not work well, she reported loose control of her bowel movements after taking Imitrex.   She was not able to identify trigger for her migraine, she does couple days each week because of her severe headaches, she has never tried any preventive medications in the past  UPDATE Dec 22 2014: She is now taking Inderal 60 mg twice a day, tolerating medication well, she did see mild to moderate improvement of her headache instead of twice week, she is now having her typical migraine once a week, she has left-sided retro-orbital area severe pounding headache with associated light noise sensitivity, nauseous, it can last for a few days, Maxalt works sometimes, but she has to repeat multiple times,  sometimes she go to hospital for injection, she has tried migraine cocktail years, Compazine, Benadryl, Toradol with limited help, sometimes she has to use Demerol about once a year for migraine  UPDATE Mar 08 2015: She did have Botox injection as migraine prevention by rehabilitation physician Dr. Posey Pronto February 04 2015, which has helped her headache, she has less severe, less frequent headaches, couple times each week, she is now receiving naproxen 1-2 times each week for mild to moderate headaches, use Imitrex injection for more severe headaches, which works well for her,  UPDATE Oct 02 2016: She is using imitrex prn once a month, for severe migraine headaches, which did help her headaches, continue Botox injection by rehabilitation physician Dr. Posey Pronto, she reported significant improvement with Botox injection, she is now on lower dose of propanolol 30 mg twice a day, higher dose cause decreased blood pressure  She reported taking Excedrin Migraine about every other day for mild to moderate headaches  UPDATE Jan 25 2017: Laboratory evaluation on January 19, 2017, acetylcholine receptor antibody was negative, normal TSH, CBC, CMP.  She is receiving BOTOX injection every 3 monthsfrom rehabphysician Dr. Marlin Canary recent injection was on November 08 2016, which hashelped her migraine  She still has migraine 1-2 a week, using maxalt, zofran, tizanidine prn, twice a week,   She has develeopeddroopy eyelidsince June 2018, to the point of covering her visionsometimes, she also complains of chewing difficulty, but denies double vision, no significant limb muscle weakness.  Update April 04, 2019: Lauren Chapman came back for follow-up after a long gap following last visit in January 2019, complains of worsening migraine headaches, she is still on  polypharmacy for her bipolar disorder, Zoloft 200 mg every day, Remeron 15 mg at bedtime, lamotrigine 150 mg twice a day, gabapentin 800 mg 3 times a  day, clonazepam 0.5 mg 3 times a day, Vraylar 4.5 mg at bedtime  She has used up her migraine preventive medications, Maxalt, Zofran, tizanidine, patient stated dose medicine works well for her migraine  She now complains of migraine on a daily basis, has been taking multiple dose of Excedrin Migraine over the past few months, today she came in with 8 out of 10 holoacranial headaches been ongoing for a week, try to hold medications without improvement, previous attempt for Aimovig as migraine premention was not approved by her insurance company.  Update July 03, 2019 SS: Her headaches have improved since last seen, was having 6-8 a month- after Ajovy, having 3-4 a month. In the last 3 months has only done 2 Ajovy injections, her pharmacy was out of the medicine.  She switched insurances, has some trouble getting Maxalt, is out of the medication.  With Maxalt, works well for acute headache when combined with Zofran and tizanidine.  Currently has headache today-typical pattern, is under more stress due to selling her home.  She remains on clonazepam, Lamictal, Remeron, Vraylar, and Zoloft.  REVIEW OF SYSTEMS: Out of a complete 14 system review of symptoms, the patient complains only of the following symptoms, and all other reviewed systems are negative.  Headache  ALLERGIES: Allergies  Allergen Reactions  . Codeine Anaphylaxis    Anything with codeine    HOME MEDICATIONS: Outpatient Medications Prior to Visit  Medication Sig Dispense Refill  . albuterol (PROVENTIL HFA;VENTOLIN HFA) 108 (90 Base) MCG/ACT inhaler Inhale 2 puffs into the lungs every 4 (four) hours as needed for wheezing or shortness of breath. 1 Inhaler 1  . clonazePAM (KLONOPIN) 0.5 MG tablet Take 0.5 mg by mouth 3 (three) times daily.   2  . esomeprazole (NEXIUM) 20 MG capsule Take 1 capsule (20 mg total) by mouth 2 (two) times daily before a meal. 60 capsule 1  . Fremanezumab-vfrm (AJOVY) 225 MG/1.5ML SOSY Inject 225 mg into  the skin every 30 (thirty) days. 1.5 mL 11  . gabapentin (NEURONTIN) 800 MG tablet Take 800 mg by mouth 3 (three) times daily.     Marland Kitchen lamoTRIgine (LAMICTAL) 150 MG tablet Take 150 mg by mouth 2 (two) times daily.     . meloxicam (MOBIC) 15 MG tablet Take 1 tablet (15 mg total) by mouth daily. 30 tablet 0  . methocarbamol (ROBAXIN) 500 MG tablet Take 1 tablet (500 mg total) by mouth every 8 (eight) hours as needed for muscle spasms. 90 tablet 0  . mirtazapine (REMERON) 15 MG tablet Take 15 mg by mouth at bedtime.    . ondansetron (ZOFRAN) 4 MG tablet Take 1 tablet (4 mg total) by mouth every 8 (eight) hours as needed for nausea or vomiting. 20 tablet 6  . sertraline (ZOLOFT) 100 MG tablet Take 100 mg by mouth daily. Taking 3 tabs in am    . tiZANidine (ZANAFLEX) 4 MG tablet Take 1 tablet (4 mg total) by mouth every 6 (six) hours as needed for muscle spasms. TAKE 1 TABLET BY MOUTH EVERY 6 HOURS AS NEEDED FOR MUSCLE SPASMS. 30 tablet 6  . VRAYLAR 6 MG CAPS Take 1 capsule by mouth daily.    . rizatriptan (MAXALT) 10 MG tablet Take 1 tab at onset of migraine.  May repeat in 2 hrs, if needed.  Max  dose: 2 tabs/day. This is a 30 day prescription. (Patient taking differently: Take 10 mg by mouth as needed for migraine. May repeat in 2 hrs, if needed.  Max dose: 2 tabs/day. This is a 30 day prescription.) 12 tablet 6  . docusate sodium (COLACE) 100 MG capsule Take 1 capsule (100 mg total) by mouth 2 (two) times daily. Hold for loose stool 60 capsule 2   No facility-administered medications prior to visit.    PAST MEDICAL HISTORY: Past Medical History:  Diagnosis Date  . Anxiety   . Bipolar affective psychosis ([place])   . Complication of anesthesia    " Acting silly- waving at everyone"-Giddy  . Depression   . Emphysema lung ([facility])    inhaler prn,  followed by pcp and pulmonologist-- dr Margorie John  . Family history of adverse reaction to anesthesia    Father had rheumatic fever, and died on operating  table.  Marland Kitchen GERD (gastroesophageal reflux disease)   . Migraine   . Smokers' cough ([place])    per pt not productive  . SUI (stress urinary incontinence, female)   . Wears glasses     PAST SURGICAL HISTORY: Past Surgical History:  Procedure Laterality Date  . CARPAL TUNNEL RELEASE Right 09-12-2007   @[email or web address]    and GANGLION CYST EXCISION  . CYSTOSCOPY N/A 04/29/2019   Procedure: CYSTOSCOPY;  Surgeon: Joseph Pierini, MD;  Location: [facility];  Service: Gynecology;  Laterality: N/A;  . DILATATION & CURETTAGE/HYSTEROSCOPY WITH MYOSURE N/A 11/13/2018   Procedure: [place];  Surgeon: Anastasio Auerbach, MD;  Location: [facility];  Service: Gynecology;  Laterality: N/A;  request 9:00am OR start time in [place] [facility] block requests one hour  . DILATION AND CURETTAGE OF UTERUS  02/2019  . GANGLION CYST EXCISION     R wrist  . KNEE ARTHROSCOPY WITH ANTERIOR CRUCIATE LIGAMENT (ACL) REPAIR Right 10/2015  . LAPAROSCOPIC CHOLECYSTECTOMY  1997  . LAPAROSCOPIC HYSTERECTOMY Bilateral 04/29/2019   Procedure: HYSTERECTOMY TOTAL LAPAROSCOPIC, BILATERAL SALPINO-OOPHORECTOMY;  Surgeon: Joseph Pierini, MD;  Location: [facility];  Service: Gynecology;  Laterality: Bilateral;  . TOOTH EXTRACTION  04/01/2019    FAMILY HISTORY: Family History  Problem Relation Age of Onset  . Depression Mother   . Cancer Mother        Cervical  . Alcohol abuse Father   . Crohn's disease Sister   . Stroke Brother 43  . Migraines Daughter   . GER disease Daughter   . Depression Daughter   . Migraines Daughter   . GER disease Daughter     SOCIAL HISTORY: Social History   Socioeconomic History  . Marital status: Divorced    Spouse name: n/a  . Number of children: 2  . Years of education: 41  . Highest education level: Not on file  Occupational History  . Occupation: Disabled    Comment: Formerly a Pharmacist, hospital.  Tobacco Use  .  Smoking status: Current Every Day Smoker    Packs/day: 0.50    Years: 33.00    Pack years: 16.50    Types: Cigarettes    Start date: 10/10/1981  . Smokeless tobacco: Never Used  . Tobacco comment: 04/03/19   per pt down to 4-5 cig per day  Vaping Use  . Vaping Use: Never used  Substance and Sexual Activity  . Alcohol use: Never    Alcohol/week: 0.0 standard drinks  . Drug use: Never  . Sexual activity: Not on file  Comment: 1st intercourse 49 yo-More than 5 partners  Other Topics Concern  . Not on file  Social History Narrative   Lives at home with 1 of her two daughters   Right-handed.   2-4 cups caffeine daily.   Disabled    Social Determinants of Health   Financial Resource Strain:   . Difficulty of Paying Living Expenses:   Food Insecurity:   . Worried About Charity fundraiser in the Last Year:   . Arboriculturist in the Last Year:   Transportation Needs:   . Film/video editor (Medical):   Marland Kitchen Lack of Transportation (Non-Medical):   Physical Activity:   . Days of Exercise per Week:   . Minutes of Exercise per Session:   Stress:   . Feeling of Stress :   Social Connections:   . Frequency of Communication with Friends and Family:   . Frequency of Social Gatherings with Friends and Family:   . Attends Religious Services:   . Active Member of Clubs or Organizations:   . Attends Archivist Meetings:   Marland Kitchen Marital Status:   Intimate Partner Violence:   . Fear of Current or Ex-Partner:   . Emotionally Abused:   Marland Kitchen Physically Abused:   . Sexually Abused:    PHYSICAL EXAM  Vitals:   07/03/19 1410  BP: 122/82  Pulse: 82  Weight: 181 lb (82.1 kg)  Height: 5' 2.5" (1.588 m)   Body mass index is 32.58 kg/m.  Generalized: Well developed, in no acute distress   Neurological examination  Mentation: Alert oriented to time, place, history taking. Follows all commands speech and language fluent Cranial nerve II-XII: Pupils were equal round reactive to  light. Extraocular movements were full, visual field were full on confrontational test. Facial sensation and strength were normal. Head turning and shoulder shrug  were normal and symmetric. Motor: The motor testing reveals 5 over 5 strength of all 4 extremities. Good symmetric motor tone is noted throughout.  Sensory: Sensory testing is intact to soft touch on all 4 extremities. No evidence of extinction is noted.  Coordination: Cerebellar testing reveals good finger-nose-finger and heel-to-shin bilaterally.  Gait and station: Gait is normal. Reflexes: Deep tendon reflexes are symmetric and normal bilaterally.   DIAGNOSTIC DATA (LABS, IMAGING, TESTING) - I reviewed patient records, labs, notes, testing and imaging myself where available.  Lab Results  Component Value Date   WBC 8.0 04/21/2019   HGB 11.1 (L) 04/30/2019   HCT 42.2 04/21/2019   MCV 92.1 04/21/2019   PLT 193 04/21/2019      Component Value Date/Time   NA 143 03/24/2019 1510   K 4.6 03/24/2019 1510   CL 107 (H) 03/24/2019 1510   CO2 19 (L) 03/24/2019 1510   GLUCOSE 97 03/24/2019 1510   GLUCOSE 97 11/13/2018 0934   BUN 10 03/24/2019 1510   CREATININE 1.06 (H) 03/24/2019 1510   CREATININE 1.12 (H) 08/17/2015 1608   CALCIUM 9.7 03/24/2019 1510   PROT 6.9 03/24/2019 1510   ALBUMIN 4.4 03/24/2019 1510   AST 14 03/24/2019 1510   ALT 13 03/24/2019 1510   ALKPHOS 98 03/24/2019 1510   BILITOT <0.2 03/24/2019 1510   GFRNONAA 61 03/24/2019 1510   GFRAA 71 03/24/2019 1510   Lab Results  Component Value Date   CHOL 179 10/11/2011   HDL 50 10/11/2011   LDLCALC 103 (H) 10/11/2011   TRIG 130 10/11/2011   CHOLHDL 3.6 10/11/2011   No results  found for: HGBA1C No results found for: VITAMINB12 Lab Results  Component Value Date   TSH 0.86 09/09/2018      ASSESSMENT AND PLAN 49 y.o. year old female  has a past medical history of Anxiety, Bipolar affective psychosis ([place]), Complication of anesthesia, Depression,  Emphysema lung ([place]), Family history of adverse reaction to anesthesia, GERD (gastroesophageal reflux disease), Migraine, Smokers' cough ([place]), SUI (stress urinary incontinence, female), and Wears glasses. here with:  1. Chronic migraine headache 2.  Polypharmacy treatment -Headaches better controlled, 3-4 a month with Ajovy -Continue Ajovy 225 monthly injection (may need to switch to [employer], depending on new health insurance) -Continue Maxalt, tizanidine, Zofran for acute headache-Maxalt resent today for acute headache -Previously tried Botox, was helpful, but complained of bilateral ptosis, weakness following her last injection in October 2018, extensive evaluation showed negative acetylcholine receptor antibody-still felt Botox is not a good option for migraine prevention -MRI of the brain without contrast in January 2019 was normal -EMG nerve conduction study in January 2019 was normal -Follow-up in 6 months or sooner if needed  I spent 20 minutes of face-to-face and non-face-to-face time with patient.  This included previsit chart review, lab review, study review, order entry, electronic health record documentation, patient education.  Butler Denmark, AGNP-C, DNP 07/03/2019, 2:40 PM [facility] [address] [place], [place] [address] [phone]

## 2019-07-03 NOTE — Patient Instructions (Signed)
Continue current medications °See you back in 6 months  °

## 2019-07-07 ENCOUNTER — Ambulatory Visit: Payer: Self-pay | Admitting: Neurology

## 2019-07-16 ENCOUNTER — Other Ambulatory Visit: Payer: Self-pay | Admitting: Family Medicine

## 2019-07-21 ENCOUNTER — Encounter: Payer: Self-pay | Admitting: Physical Therapy

## 2019-07-21 ENCOUNTER — Other Ambulatory Visit: Payer: Self-pay

## 2019-07-21 ENCOUNTER — Ambulatory Visit: Payer: Medicare Other | Attending: Sports Medicine | Admitting: Physical Therapy

## 2019-07-21 DIAGNOSIS — M6281 Muscle weakness (generalized): Secondary | ICD-10-CM | POA: Diagnosis not present

## 2019-07-21 DIAGNOSIS — M25662 Stiffness of left knee, not elsewhere classified: Secondary | ICD-10-CM | POA: Diagnosis not present

## 2019-07-21 DIAGNOSIS — G8929 Other chronic pain: Secondary | ICD-10-CM | POA: Diagnosis not present

## 2019-07-21 DIAGNOSIS — M25552 Pain in left hip: Secondary | ICD-10-CM

## 2019-07-21 DIAGNOSIS — R2689 Other abnormalities of gait and mobility: Secondary | ICD-10-CM

## 2019-07-21 DIAGNOSIS — M25562 Pain in left knee: Secondary | ICD-10-CM | POA: Insufficient documentation

## 2019-07-21 NOTE — Patient Instructions (Signed)
Access Code: [email or web address] URL: [email or web address] Date: 07/21/2019 Prepared by: Hilda Blades  Exercises Supine Quad Set - 1 x daily - 7 x weekly - 10 reps - 5 seconds hold Supine Active Straight Leg Raise - 1 x daily - 7 x weekly - 2 sets - 8 reps Supine Heel Slide with Strap - 1 x daily - 7 x weekly - 10 reps - 5 seconds hold Sidelying Hip Abduction - 1 x daily - 7 x weekly - 2 sets - 10 reps Bridge - 1 x daily - 7 x weekly - 2 sets - 10 reps - 3 seconds hold

## 2019-07-21 NOTE — Therapy (Signed)
[place], [place], [address] Phone: [phone]   Fax:  [phone]  Physical Therapy Evaluation  Patient Details  Name: Lauren Chapman MRN: [id number] Date of Birth: [birth year] Referring Provider (PT): Thurman Coyer, DO   Encounter Date: 07/21/2019   PT End of Session - 07/21/19 1050    Visit Number 1    Number of Visits 8    Date for PT Re-Evaluation 09/15/19    Authorization Type U[employer] MCR/MCD    Progress Note Due on Visit 10    PT Start Time 1050    PT Stop Time 1130    PT Time Calculation (min) 40 min    Activity Tolerance Patient tolerated treatment well;Patient limited by pain    Behavior During Therapy [facility] for tasks assessed/performed           Past Medical History:  Diagnosis Date  . Anxiety   . Bipolar affective psychosis ([place])   . Complication of anesthesia    " Acting silly- waving at everyone"-Giddy  . Depression   . Emphysema lung ([facility])    inhaler prn,  followed by pcp and pulmonologist-- dr Margorie John  . Family history of adverse reaction to anesthesia    Father had rheumatic fever, and died on operating table.  Marland Kitchen GERD (gastroesophageal reflux disease)   . Migraine   . Smokers' cough ([place])    per pt not productive  . SUI (stress urinary incontinence, female)   . Wears glasses     Past Surgical History:  Procedure Laterality Date  . CARPAL TUNNEL RELEASE Right 09-12-2007   @[facility]    and GANGLION CYST EXCISION  . CYSTOSCOPY N/A 04/29/2019   Procedure: CYSTOSCOPY;  Surgeon: Joseph Pierini, MD;  Location: [facility];  Service: Gynecology;  Laterality: N/A;  . DILATATION & CURETTAGE/HYSTEROSCOPY WITH MYOSURE N/A 11/13/2018   Procedure: [place];  Surgeon: Anastasio Auerbach, MD;  Location: [place];  Service: Gynecology;  Laterality: N/A;  request 9:00am OR start time in [facility] block requests one  hour  . DILATION AND CURETTAGE OF UTERUS  02/2019  . GANGLION CYST EXCISION     R wrist  . KNEE ARTHROSCOPY WITH ANTERIOR CRUCIATE LIGAMENT (ACL) REPAIR Right 10/2015  . LAPAROSCOPIC CHOLECYSTECTOMY  1997  . LAPAROSCOPIC HYSTERECTOMY Bilateral 04/29/2019   Procedure: HYSTERECTOMY TOTAL LAPAROSCOPIC, BILATERAL SALPINO-OOPHORECTOMY;  Surgeon: Joseph Pierini, MD;  Location: [place];  Service: Gynecology;  Laterality: Bilateral;  . TOOTH EXTRACTION  04/01/2019    There were no vitals filed for this visit.    Subjective Assessment - 07/21/19 1051    Subjective Patient reports when she would lay on her left side she would wake up and have burning of the outside of left hip down outside thigh to left knee. This had been going on for several months and she feels it is worsening. Patient also reports chronic left knee pain that is pretty constant and located around the knee cap. She has been diagnosed with bursitis in left hip and arthritis in left knee. She continues to be woken up at night due to the left hip pain and it is lasting longer before the pain subsides. She reports significant limitation with stairs due to left knee and is unable to sit or transfer with the left knee bent, she keeps it extended in front of her.    Pertinent History Right ACLr 10/2015, chronic migraines, bipolar disorder, anxiety/depression  Limitations Standing;Walking;House hold activities;Sitting;Lifting;Other (comment)   Stairs   How long can you sit comfortably? No limitation    How long can you stand comfortably? No limitation    How long can you walk comfortably? No limitation    Diagnostic tests None    Patient Stated Goals Get rid of pain and move better    Currently in Pain? Yes    Pain Score 5     Pain Location Hip    Pain Orientation Left    Pain Descriptors / Indicators Aching    Pain Type Chronic pain    Pain Radiating Towards left hip to left knee    Pain Onset More than a month  ago    Pain Frequency Intermittent    Aggravating Factors  Lying on left side    Pain Relieving Factors Medication    Effect of Pain on Daily Activities Patient keeps being woken up at night    Multiple Pain Sites Yes    Pain Score 7    Pain Location Knee    Pain Orientation Left    Pain Descriptors / Indicators Aching    Pain Type Chronic pain    Pain Onset More than a month ago    Pain Frequency Constant    Aggravating Factors  Stairs    Pain Relieving Factors Medication    Effect of Pain on Daily Activities Patient limited with mobility and stairs              [facility] PT Assessment - 07/21/19 0001      Assessment   Medical Diagnosis Chronic pain of left knee and hip    Referring Provider (PT) Thurman Coyer, DO    Onset Date/Surgical Date --   patient reports several months ago   Hand Dominance Right    Next MD Visit 07/30/2019    Prior Therapy Yes      Precautions   Precautions None      Restrictions   Weight Bearing Restrictions No      Balance Screen   Has the patient fallen in the past 6 months No    Has the patient had a decrease in activity level because of a fear of falling?  No    Is the patient reluctant to leave their home because of a fear of falling?  No      Home Environment   Living Environment Private residence    Type of Home House      Prior Function   Level of Independence Independent    Leisure None reported - patient is currently preparing to move so doing a lot of packing      Cognition   Overall Cognitive Status Within Functional Limits for tasks assessed      Observation/Other Assessments   Observations Patient appears in no apparent distress    Focus on Therapeutic Outcomes (FOTO)  48% limitation      Sensation   Light Touch Appears Intact      ROM / Strength   AROM / PROM / Strength AROM;PROM;Strength      AROM   AROM Assessment Site Knee    Right/Left Knee Right;Left    Right Knee Extension 0    Right Knee Flexion 130     Left Knee Extension 0    Left Knee Flexion 90      PROM   Overall PROM Comments Hip PROM grossly WFL bilaterally, increased left hip pain with all movement  Strength   Strength Assessment Site Hip;Knee    Right/Left Hip Right;Left    Right Hip Flexion 4+/5    Right Hip Extension 4-/5    Right Hip ABduction 4-/5    Left Hip Flexion 4-/5    Left Hip Extension 3+/5    Left Hip ABduction 3+/5    Right/Left Knee Right;Left    Right Knee Flexion 5/5    Right Knee Extension 5/5    Left Knee Flexion 4/5    Left Knee Extension 4/5      Flexibility   Soft Tissue Assessment /Muscle Length yes    Hamstrings WNL    Quadriceps Not asssessed    ITB Increased left lateral hip pain with tetsing      Palpation   Patella mobility WFL    Palpation comment TTP grossly peripatellar region,and lateral>medial joint line, left greater trochanter and lateral thigh      Transfers   Transfers Independent with all Transfers      Ambulation/Gait   Ambulation/Gait Yes    Ambulation/Gait Assistance 7: Independent    Gait Comments Antalgic on left                      Objective measurements completed on examination: See above findings.       [facility] PT Treatment/Exercise - 07/21/19 0001      Exercises   Exercises Knee/Hip      Knee/Hip Exercises: Supine   Quad Sets 10 reps   5 sec hold   Heel Slides 10 reps   5 sec hold with strap   Bridges 10 reps    Straight Leg Raises 10 reps      Knee/Hip Exercises: Sidelying   Hip ABduction 10 reps                  PT Education - 07/21/19 1049    Education Details Exam findings, POC, HEP, negotiating stairs using up with good and down with bad technique    Person(s) Educated Patient    Methods Explanation;Demonstration;Tactile cues;Verbal cues;Handout    Comprehension Verbalized understanding;Returned demonstration;Verbal cues required;Tactile cues required;Need further instruction            PT Short Term Goals  - 07/21/19 1134      PT SHORT TERM GOAL #1   Title Patient will be I with initial HEP to progress in PT    Time 4    Period Weeks    Status New    Target Date 08/18/19      PT SHORT TERM GOAL #2   Title Patient will report improved left hip pain to </= 4/10 and left knee pain to </= 5/10 to improve functional mobility    Time 4    Period Weeks    Status New    Target Date 08/18/19      PT SHORT TERM GOAL #3   Title Patient will exhibit improved left knee flexion >/= 115 deg to improve ability to perform sit<>stand and transfers    Time 4    Period Weeks    Status New    Target Date 08/18/19             PT Long Term Goals - 07/21/19 1136      PT LONG TERM GOAL #1   Title Patient will be I with final HEP to maintain progress from PT    Time 8    Period Weeks    Status  New    Target Date 09/15/19      PT LONG TERM GOAL #2   Title Patient will demonstrate improved strength of left hip to >/= 4/5 MMT and left knee to >/= 5/5 MMT to improve ability to go up/down stairs    Time 8    Period Weeks    Status New    Target Date 09/15/19      PT LONG TERM GOAL #3   Title Patient will report no sleep disturbances due to left hip pain    Time 8    Period Weeks    Status New    Target Date 09/15/19      PT LONG TERM GOAL #4   Title Patient will demonstrated improve left knee active motion equal to opposite side and without pain to improve gait    Time 8    Period Weeks    Status New    Target Date 09/15/19      PT LONG TERM GOAL #5   Title Patient will report improved functional level of </= 40% limitation on FOTO    Time 8    Period Weeks    Status New    Target Date 09/15/19                  Plan - 07/21/19 1143    Clinical Impression Statement Patient presents to PT with report of lateral chronic left hip pain that radiates down the outside of the left thigh to her knee, and chronic anterior left knee pain. Her hip and knee symptoms due to seem separate  with most likely left gluteal tendinopathy and patellofemoral pain. She demonstrates decreased strength of the left knee and hip compared to opposite side with limitation in left knee flexion due to pain. She also exhibits gait deviations, difficulty with stairs due to anterior knee pain, and inability to lie on her left side due to lateral hip pain. She was provided exercises to improve knee motion and initiate strengthening for her knee na ship. She would benefit from continued skilled PT to improve her strength and mobility in order to reduce pain with activity and return to prior level of function.    Personal Factors and Comorbidities Fitness;Past/Current Experience;Comorbidity 3+;Time since onset of injury/illness/exacerbation;Social Background    Comorbidities Right ACLr 10/2015, chronic migraines, bipolar disorder, anxiety/depression    Examination-Activity Limitations Locomotion Level;Transfers;Sit;Squat;Stairs;Sleep;Stand;Lift;Carry    Examination-Participation Restrictions Meal Prep;Cleaning;Community Activity;Shop;Yard Work    [employer] Evolving/Moderate complexity    Clinical Decision Making Moderate    Rehab Potential Good    PT Frequency 1x / week    PT Duration 8 weeks    PT Treatment/Interventions ADLs/Self Care Home Management;Cryotherapy;Electrical Stimulation;Iontophoresis 4mg /ml Dexamethasone;Moist Heat;Ultrasound;Neuromuscular re-education;Balance training;Therapeutic exercise;Therapeutic activities;Functional mobility training;Stair training;Gait training;Patient/family education;Manual techniques;Dry needling;Passive range of motion;Taping;Joint Manipulations    PT Next Visit Plan Assess HEP and progress PRN, left knee flexion range of motion, progress left knee and hip strength, manual and modalities as needed for pain    PT Home Exercise Plan [id number]: supine quad set, SLR, supine heel slide with strap, sidelying hip abduction, bridge    Consulted and  Agree with Plan of Care Patient           Patient will benefit from skilled therapeutic intervention in order to improve the following deficits and impairments:  Abnormal gait, Decreased range of motion, Difficulty walking, Decreased activity tolerance, Pain, Decreased strength  Visit Diagnosis: Pain in left hip  Chronic pain  of left knee  Stiffness of left knee, not elsewhere classified  Muscle weakness (generalized)  Other abnormalities of gait and mobility     Problem List Patient Active Problem List   Diagnosis Date Noted  . History of postmenopausal bleeding 04/29/2019  . Polypharmacy 04/03/2019  . Emphysema, unspecified ([place]) 05/17/2018  . Weakness 01/25/2017  . Chronic migraine 12/22/2014  . Encounter for other general counseling or advice on contraception 09/02/2014  . Bipolar disorder ([place]) 10/12/2011  . Anxiety and depression 10/12/2011  . GERD (gastroesophageal reflux disease) 10/12/2011    Hilda Blades, PT, DPT, LAT, ATC 07/21/19  3:24 PM Phone: [phone] Fax: [place] [facility] [address] [place], [place], [address] Phone: [phone]   Fax:  [phone]  Name: Lauren Chapman MRN: [id number] Date of Birth: 19-Feb-1970

## 2019-07-22 ENCOUNTER — Telehealth: Payer: Self-pay

## 2019-07-22 NOTE — Telephone Encounter (Signed)
Voicemail message, 11:42a:  Patient says that Lauren Chapman is not covered and that Aimovig or Emgality are the preferred medications. Please send new Rx to pharmacy.

## 2019-07-23 MED ORDER — EMGALITY 120 MG/ML ~~LOC~~ SOAJ: 120.0000 mg | SUBCUTANEOUS | 11 refills | Status: DC

## 2019-07-23 NOTE — Addendum Note (Signed)
Addended by: Suzzanne Cloud on: 07/23/2019 05:41 AM   Modules accepted: Orders

## 2019-07-23 NOTE — Telephone Encounter (Signed)
Let's switch to [employer]. 120 mg monthly injection. Stop the Ajovy. Rx sent to [facility].

## 2019-07-24 NOTE — Telephone Encounter (Signed)
Pt has called and was made aware of her Emgality  Being called in, no call back requested

## 2019-07-24 NOTE — Telephone Encounter (Signed)
Noted  

## 2019-07-30 DIAGNOSIS — H4423 Degenerative myopia, bilateral: Secondary | ICD-10-CM | POA: Diagnosis not present

## 2019-07-30 DIAGNOSIS — H04123 Dry eye syndrome of bilateral lacrimal glands: Secondary | ICD-10-CM | POA: Diagnosis not present

## 2019-07-30 DIAGNOSIS — H43813 Vitreous degeneration, bilateral: Secondary | ICD-10-CM | POA: Diagnosis not present

## 2019-07-30 DIAGNOSIS — H2513 Age-related nuclear cataract, bilateral: Secondary | ICD-10-CM | POA: Diagnosis not present

## 2019-07-31 ENCOUNTER — Ambulatory Visit (INDEPENDENT_AMBULATORY_CARE_PROVIDER_SITE_OTHER): Payer: Medicare Other | Admitting: Sports Medicine

## 2019-07-31 ENCOUNTER — Other Ambulatory Visit: Payer: Self-pay

## 2019-07-31 ENCOUNTER — Ambulatory Visit
Admission: RE | Admit: 2019-07-31 | Discharge: 2019-07-31 | Disposition: A | Discharge status: Home / Self Care | Payer: Medicare Other | Source: Ambulatory Visit | Attending: Sports Medicine | Admitting: Sports Medicine

## 2019-07-31 VITALS — BP 108/78 | Ht 62.5 in | Wt 180.0 lb

## 2019-07-31 DIAGNOSIS — M25552 Pain in left hip: Secondary | ICD-10-CM | POA: Diagnosis not present

## 2019-07-31 DIAGNOSIS — G8929 Other chronic pain: Secondary | ICD-10-CM

## 2019-07-31 DIAGNOSIS — M25562 Pain in left knee: Secondary | ICD-10-CM | POA: Diagnosis not present

## 2019-07-31 DIAGNOSIS — M76892 Other specified enthesopathies of left lower limb, excluding foot: Secondary | ICD-10-CM | POA: Diagnosis not present

## 2019-07-31 MED ORDER — LIDOCAINE 5 % EX PTCH: 1.0000 | MEDICATED_PATCH | CUTANEOUS | 0 refills | Status: DC

## 2019-07-31 NOTE — Telephone Encounter (Signed)
Initiated [facility] # [id number] for Emgality 120mg /ml autoinjector  every 30 days. Determination pending.

## 2019-07-31 NOTE — Telephone Encounter (Signed)
Pt has called to report that her insurance company told her that a PA is needed on her Galcanezumab-gnlm (EMGALITY) 120 MG/ML SOAJ.  She was only given a few tablets, please call.

## 2019-07-31 NOTE — Progress Notes (Signed)
Lauren Chapman - 49 y.o. female MRN [id number]  Date of birth: 1970-08-27    SUBJECTIVE:      Chief Complaint:/ HPI:  Left hip: Patient has a history of chronic left hip pain for which she was previously seen in sports medicine clinic about 1 month ago.  Although her left hip pain was not a classic presentation of greater trochanteric bursitis, she was recommended to go to physical therapy and asked to follow-up in clinic 4 weeks later.  Since then patient has had 1 formal session of physical therapy (07/21/2019) but says she has been performing her physical therapy exercises at home (straight leg lift with hip flexion, hip abduction, etc.).  Today she reports she feels that her pain is worse.  Continues to have severe pains while laying on her left hip at night.  Reports that she has to roll onto her back to take the weight off of her hip, after which she feels significant throbbing and achiness in the left hip.  Additionally, she reports she is to have a feather mattress Topper, but removed it and threw it away during her most recent move because it was getting old.  The hip pain is 10/10 at night but on a daily basis reports the pain is 5-7/10.  She reports taking the full prescription of her meloxicam.  After she ran out, she started taking her daughters leftover ibuprofen 800 mg twice daily for the hip pain.  Additionally, she has been occasionally incorporating BC powder into her pain regimen to help reduce pain.  She has also been using a cold water bottle to roll along her left lateral thigh (along the IT band) to help reduce pains.  Denies any rashes, redness, joint swelling.  No new falls or injuries.  Next PT appointment is tomorrow at noon.  L knee pain: Patient reports the left knee pain is worse.  She reports that it is now hard for her to walk upstairs, reports it is hurting "all around the kneecap".  She is unable to squat or bend, which is especially difficult for her at this point in time  as she is currently trying to clean of her old house to move and sell it.  She has been trying to keep her left leg straight as is what makes it feels better.  Has been alternating heat and ice topically to the knee, which she reports feels good and takes a "way but only temporarily.  Additionally, has been wearing her knee compression sleeve but is not wearing it today as it is currently in the wash.  She reports the knee sleeve is helpful and supportive.  Denies any rashes to the knee, redness, or knee swelling.  Again no falls, injuries.  Concerningly, patient reports that she fell down her stairs about 3 months ago.  She has poor vision due to cataracts.  Is due for cataract surgery with lens placement at the end of this month.  She reports that she missed stepped and fell forward down the stairs, rolling down them.  Struck the wall with her teeth and broke a couple of teeth.  Denies any involvement of other people in this accident, denies she was shocked.  Reports she is "clumsy" at baseline due to poor vision.  ROS:     See HPI  PERTINENT  PMH / PSH FH / / SH:  Past Medical, Surgical, Social, and Family History Reviewed & Updated in the EMR.  Pertinent findings include:  Patient  Active Problem List   Diagnosis Date Noted   History of postmenopausal bleeding 04/29/2019   Polypharmacy 04/03/2019   Emphysema, unspecified ([place]) 05/17/2018   Weakness 01/25/2017   Chronic migraine 12/22/2014   Encounter for other general counseling or advice on contraception 09/02/2014   Bipolar disorder ([place]) 10/12/2011   Anxiety and depression 10/12/2011   GERD (gastroesophageal reflux disease) 10/12/2011    OBJECTIVE: BP 108/78    Ht 5' 2.5" (1.588 m)    Wt 180 lb (81.6 kg)    LMP 05/02/2019 Comment: irregular cycles   BMI 32.40 kg/m   Physical Exam:  Vital signs are reviewed.  GEN: Alert and oriented, NAD Pulm: Breathing unlabored PSY: normal mood, congruent affect  MSK: Left hip:  -  Inspection: No gross deformity, no swelling, erythema, or ecchymosis - Palpation: Significant tenderness to palpation of muscular structures appreciated in iliac fossa, significant tenderness to palpation superior to left greater trochanter, no masses or warmth appreciated - ROM: Decreased range of motion in comparison to left in flexion, abduction, internal and external rotation secondary to pain  - Strength: 4/5 strength (secondary to pain) of left hip in flexion. - Neuro/vasc: Normal sensation to both hips and thighs bilaterally - Special Tests: Unable to perform secondary to pain  Left knee: - Inspection: no gross deformity, swelling, erythema or bruising - Palpation: TTP appreciated superior, inferiorly, laterally, and medially to patella; mild medial joint line TTP - ROM: Limited secondary to pain - Strength: 4/5 strength with knee extension and flexion - Neuro/vasc: NV intact - Special Tests: - LIGAMENTS: negative anterior and posterior drawer, negative Lachman's -- MENISCUS: negative McMurray's, negative Thessaly  -- PF JOINT: nml patellar mobility bilaterally.  negative patellar grind, negative patellar apprehension  ASSESSMENT & PLAN:  1.  Chronic left hip pain: Hip exam limited at today's visit secondary to tenderness to palpation and pain (although majority of pain is coming from patient's left knee).  Consider greater trochanteric bursitis as cause, also consider gluteus medius or tensor fascia lata injury/soreness.  Less likely hip arthritis as no radiation of pain to patient's groin. -Apply Lidoderm patch to left thigh, especially at night with needs of decreasing pain during nighttime. Patient instructed to not apply heat over the Lidoderm patch as this can increase absorption of medicine and harmful way. -Would recommend replacing mattress Topper as a firm mattress might be contributing to her left hip pain -Recommend Aleve 500 mg twice daily with Tylenol 500 mg 3 times daily.   Recommended stopping ibuprofen, Motrin, Advil, naproxen, and BC powder as this can potentially damage her stomach.  2.  Left knee pain: -Continue physical therapy -Continue to wear compression sleeve -Ordering x-rays of right knee (standing, sunrise, AP) -Recommend Aleve 500 mg twice daily with Tylenol 500 mg 3 times daily.  Recommended stopping ibuprofen, Motrin, Advil, naproxen, and BC powder as this can potentially damage her stomach.   Milus Banister, [place], PGY-3 07/31/2019 11:04 AM  Patient seen and evaluated with the resident.  I agree with the above plan of care.  Patient has only attended 1 session of physical therapy.  I recommended that she continue with PT for her chronic left hip pain.  X-rays of her left knee show no significant degenerative changes.  I recommend PT for her left knee as well.  I told the patient that I would call her early next week to see how she is doing.  We could consider an intra-articular cortisone injection of  the left knee prior to further diagnostic imaging if her pain persists.

## 2019-07-31 NOTE — Patient Instructions (Signed)
Thank you for coming in to see Lauren Chapman today! Please see below to review our plan for today's visit:  1. Apply Lidoderm patch to left hip to wear at night. Do NOT apply heat to this spot when patch is in place.  2. Aleve 500mg  twice daily with Tylenol 500mg  three times daily. Do not take Ibuprofen, BC powder or Aspirin  3. Go to [facility] to obtain Knee Xray.   Please call the clinic at [phone] if your symptoms worsen or you have any concerns. It was our pleasure to serve you!   Dr. Milus Banister [facility]

## 2019-08-01 ENCOUNTER — Ambulatory Visit: Payer: Medicare Other | Attending: Sports Medicine | Admitting: Physical Therapy

## 2019-08-01 DIAGNOSIS — M25552 Pain in left hip: Secondary | ICD-10-CM | POA: Diagnosis not present

## 2019-08-01 DIAGNOSIS — M25662 Stiffness of left knee, not elsewhere classified: Secondary | ICD-10-CM | POA: Insufficient documentation

## 2019-08-01 DIAGNOSIS — M25562 Pain in left knee: Secondary | ICD-10-CM | POA: Insufficient documentation

## 2019-08-01 DIAGNOSIS — R2689 Other abnormalities of gait and mobility: Secondary | ICD-10-CM

## 2019-08-01 DIAGNOSIS — G8929 Other chronic pain: Secondary | ICD-10-CM | POA: Diagnosis not present

## 2019-08-01 DIAGNOSIS — M6281 Muscle weakness (generalized): Secondary | ICD-10-CM | POA: Insufficient documentation

## 2019-08-01 NOTE — Therapy (Addendum)
[place], [place], [address] Phone: [phone]   Fax:  [phone]  Physical Therapy Treatment  Patient Details  Name: Lauren Chapman MRN: [id number] Date of Birth: 05/09/1970 Referring Provider (PT): Thurman Coyer, DO   Encounter Date: 08/01/2019   PT End of Session - 08/01/19 1219    Visit Number 2    Number of Visits 8    Date for PT Re-Evaluation 09/15/19    Authorization Type UHC MCR/MCD    PT Start Time 1218    PT Stop Time 1306    PT Time Calculation (min) 48 min    Activity Tolerance Patient tolerated treatment well;Patient limited by pain    Behavior During Therapy [facility] for tasks assessed/performed           Past Medical History:  Diagnosis Date  . Anxiety   . Bipolar affective psychosis ([place])   . Complication of anesthesia    " Acting silly- waving at everyone"-Giddy  . Depression   . Emphysema lung (HCC)    inhaler prn,  followed by pcp and pulmonologist-- dr Margorie John  . Family history of adverse reaction to anesthesia    Father had rheumatic fever, and died on operating table.  Marland Kitchen GERD (gastroesophageal reflux disease)   . Migraine   . Smokers' cough ([place])    per pt not productive  . SUI (stress urinary incontinence, female)   . Wears glasses     Past Surgical History:  Procedure Laterality Date  . CARPAL TUNNEL RELEASE Right 09-12-2007   @[facility]    and GANGLION CYST EXCISION  . CYSTOSCOPY N/A 04/29/2019   Procedure: CYSTOSCOPY;  Surgeon: Joseph Pierini, MD;  Location: [facility];  Service: Gynecology;  Laterality: N/A;  . DILATATION & CURETTAGE/HYSTEROSCOPY WITH MYOSURE N/A 11/13/2018   Procedure: [place];  Surgeon: Anastasio Auerbach, MD;  Location: [place];  Service: Gynecology;  Laterality: N/A;  request 9:00am OR start time in [facility] block requests one hour  . DILATION AND CURETTAGE OF  UTERUS  02/2019  . GANGLION CYST EXCISION     R wrist  . KNEE ARTHROSCOPY WITH ANTERIOR CRUCIATE LIGAMENT (ACL) REPAIR Right 10/2015  . LAPAROSCOPIC CHOLECYSTECTOMY  1997  . LAPAROSCOPIC HYSTERECTOMY Bilateral 04/29/2019   Procedure: HYSTERECTOMY TOTAL LAPAROSCOPIC, BILATERAL SALPINO-OOPHORECTOMY;  Surgeon: Joseph Pierini, MD;  Location: [place];  Service: Gynecology;  Laterality: Bilateral;  . TOOTH EXTRACTION  04/01/2019    There were no vitals filed for this visit.   Subjective Assessment - 08/01/19 1221    Subjective Have been moving all day already.  Pain in knee 6/10 hip 3/10.  Same aggravating and alleviating factors. XR done yesterday.    Diagnostic tests XR 08/01/19 : superior patellar ensethophyte    Currently in Pain? Yes    Pain Score 6     Pain Location Knee    Pain Orientation Left;Anterior   inferior patella   Pain Descriptors / Indicators Aching    Pain Type Chronic pain    Pain Onset More than a month ago    Pain Frequency Constant    Multiple Pain Sites Yes    Pain Score 3    Pain Location Hip    Pain Orientation Left;Lateral    Pain Descriptors / Indicators Aching;Sore    Pain Type Chronic pain    Pain Onset More than a month ago    Pain Frequency Constant  [facility] Adult PT Treatment/Exercise - 08/01/19 0001      Knee/Hip Exercises: Stretches   Active Hamstring Stretch Left;2 reps;30 seconds    Hip Flexor Stretch Left;1 rep    Hip Flexor Stretch Limitations several min for manual    and then standing x 3 each side x 30 s   Knee: Self-Stretch to increase Flexion Left;5 reps    Knee: Self-Stretch Limitations strap     ITB Stretch Limitations knee pain and hyperext.     Piriformis Stretch Limitations knees crossed and rotate trunk away       Knee/Hip Exercises: Supine   Quad Sets 10 reps   5 sec hold   Bridges 10 reps    Straight Leg Raises 10 reps    Straight Leg Raises Limitations pain in hip       Knee/Hip Exercises:  Sidelying   Hip ABduction 10 reps    Hip ABduction Limitations cues for more extension        Moist Heat Therapy   Number Minutes Moist Heat 10 Minutes    Moist Heat Location Hip      Cryotherapy   Number Minutes Cryotherapy 10 Minutes    Cryotherapy Location Knee      Manual Therapy   Manual therapy comments IASTM L rectus femoris, all quads, lateral hip, glutes                   PT Education - 08/01/19 1259    Education Details IASTM, roller, bursitis    Person(s) Educated Patient    Methods Explanation;Demonstration;Handout    Comprehension Verbalized understanding;Returned demonstration            PT Short Term Goals - 07/21/19 1134      PT SHORT TERM GOAL #1   Title Patient will be I with initial HEP to progress in PT    Time 4    Period Weeks    Status New    Target Date 08/18/19      PT SHORT TERM GOAL #2   Title Patient will report improved left hip pain to </= 4/10 and left knee pain to </= 5/10 to improve functional mobility    Time 4    Period Weeks    Status New    Target Date 08/18/19      PT SHORT TERM GOAL #3   Title Patient will exhibit improved left knee flexion >/= 115 deg to improve ability to perform sit<>stand and transfers    Time 4    Period Weeks    Status New    Target Date 08/18/19             PT Long Term Goals - 07/21/19 1136      PT LONG TERM GOAL #1   Title Patient will be I with final HEP to maintain progress from PT    Time 8    Period Weeks    Status New    Target Date 09/15/19      PT LONG TERM GOAL #2   Title Patient will demonstrate improved strength of left hip to >/= 4/5 MMT and left knee to >/= 5/5 MMT to improve ability to go up/down stairs    Time 8    Period Weeks    Status New    Target Date 09/15/19      PT LONG TERM GOAL #3   Title Patient will report no sleep disturbances due to left hip pain  Time 8    Period Weeks    Status New    Target Date 09/15/19      PT LONG TERM GOAL #4    Title Patient will demonstrated improve left knee active motion equal to opposite side and without pain to improve gait    Time 8    Period Weeks    Status New    Target Date 09/15/19      PT LONG TERM GOAL #5   Title Patient will report improved functional level of </= 40% limitation on FOTO    Time 8    Period Weeks    Status New    Target Date 09/15/19                 Plan - 08/01/19 1223    Clinical Impression Statement Pain in knee with extension and flexion , less so mid range.  Pain is inferior to patella when knee extends.  Hip was more anterior today, used IASTM to reduce tension along quads.  Reduce pain and soreness as she left session.  Modified exercises a bit and added anterior hip release.    PT Treatment/Interventions ADLs/Self Care Home Management;Cryotherapy;Electrical Stimulation;Iontophoresis 4mg /ml Dexamethasone;Moist Heat;Ultrasound;Neuromuscular re-education;Balance training;Therapeutic exercise;Therapeutic activities;Functional mobility training;Stair training;Gait training;Patient/family education;Manual techniques;Dry needling;Passive range of motion;Taping;Joint Manipulations    PT Next Visit Plan review hip hinge for lifting, ADLs.  Post procedure ther ex for post eye surgery.  left knee flexion range of motion, progress left knee and hip strength, manual and modalities as needed for pain    PT Home Exercise Plan [id number]: supine quad set, SLR, supine heel slide with strap, sidelying hip abduction, bridge. added hip flexor standing/supine    Consulted and Agree with Plan of Care Patient           Patient will benefit from skilled therapeutic intervention in order to improve the following deficits and impairments:  Abnormal gait, Decreased range of motion, Difficulty walking, Decreased activity tolerance, Pain, Decreased strength  Visit Diagnosis: Pain in left hip  Chronic pain of left knee  Stiffness of left knee, not elsewhere classified  Muscle  weakness (generalized)  Other abnormalities of gait and mobility     Problem List Patient Active Problem List   Diagnosis Date Noted  . History of postmenopausal bleeding 04/29/2019  . Polypharmacy 04/03/2019  . Emphysema, unspecified ([place]) 05/17/2018  . Weakness 01/25/2017  . Chronic migraine 12/22/2014  . Encounter for other general counseling or advice on contraception 09/02/2014  . Bipolar disorder ([place]) 10/12/2011  . Anxiety and depression 10/12/2011  . GERD (gastroesophageal reflux disease) 10/12/2011    Bora Bost 08/01/2019, 1:13 PM  [facility] [address] [place], [place], [address] Phone: [phone]   Fax:  [phone]  Name: Lauren Chapman MRN: [id number] Date of Birth: Mar 05, 1970  Raeford Razor, PT 08/01/19 1:13 PM Phone: [phone] Fax: [phone]

## 2019-08-07 ENCOUNTER — Telehealth: Payer: Self-pay | Admitting: Physical Therapy

## 2019-08-07 ENCOUNTER — Ambulatory Visit: Payer: Medicare Other | Admitting: Physical Therapy

## 2019-08-07 ENCOUNTER — Encounter: Payer: Self-pay | Admitting: Physical Therapy

## 2019-08-07 NOTE — Telephone Encounter (Signed)
Called patient regarding her missed appt today at 12:30.  No answer and no voicemail available.   Raeford Razor, PT 08/07/19 12:59 PM Phone: [phone] Fax: [phone]

## 2019-08-18 ENCOUNTER — Encounter: Payer: Self-pay | Admitting: Physical Therapy

## 2019-08-18 ENCOUNTER — Other Ambulatory Visit: Payer: Self-pay

## 2019-08-18 ENCOUNTER — Ambulatory Visit: Payer: Medicare Other | Admitting: Physical Therapy

## 2019-08-18 DIAGNOSIS — G8929 Other chronic pain: Secondary | ICD-10-CM

## 2019-08-18 DIAGNOSIS — M25662 Stiffness of left knee, not elsewhere classified: Secondary | ICD-10-CM

## 2019-08-18 DIAGNOSIS — R2689 Other abnormalities of gait and mobility: Secondary | ICD-10-CM

## 2019-08-18 DIAGNOSIS — M6281 Muscle weakness (generalized): Secondary | ICD-10-CM

## 2019-08-18 DIAGNOSIS — M25552 Pain in left hip: Secondary | ICD-10-CM

## 2019-08-18 DIAGNOSIS — M25562 Pain in left knee: Secondary | ICD-10-CM | POA: Diagnosis not present

## 2019-08-18 NOTE — Therapy (Addendum)
[place], [place], [address] Phone: [phone]   Fax:  [phone]  Physical Therapy Treatment / Discharge  Patient Details  Name: Lauren Chapman MRN: [id number] Date of Birth: Nov 06, 1970 Referring Provider (PT): Thurman Coyer, DO   Encounter Date: 08/18/2019   PT End of Session - 08/18/19 1139    Visit Number 3    Number of Visits 8    Date for PT Re-Evaluation 09/15/19    Authorization Type UHC MCR/MCD    Progress Note Due on Visit 10    PT Start Time 1131    PT Stop Time 1212    PT Time Calculation (min) 41 min    Activity Tolerance Patient tolerated treatment well;Patient limited by pain    Behavior During Therapy [facility] for tasks assessed/performed           Past Medical History:  Diagnosis Date  . Anxiety   . Bipolar affective psychosis ([place])   . Complication of anesthesia    " Acting silly- waving at everyone"-Giddy  . Depression   . Emphysema lung (HCC)    inhaler prn,  followed by pcp and pulmonologist-- dr Margorie John  . Family history of adverse reaction to anesthesia    Father had rheumatic fever, and died on operating table.  Marland Kitchen GERD (gastroesophageal reflux disease)   . Migraine   . Smokers' cough ([place])    per pt not productive  . SUI (stress urinary incontinence, female)   . Wears glasses     Past Surgical History:  Procedure Laterality Date  . CARPAL TUNNEL RELEASE Right 09-12-2007   _0    and GANGLION CYST EXCISION  . CYSTOSCOPY N/A 04/29/2019   Procedure: CYSTOSCOPY;  Surgeon: Joseph Pierini, MD;  Location[facility];  Service: Gynecology;  Laterality: N/A;  . DILATATION & CURETTAGE/HYSTEROSCOPY WITH MYOSURE N/A 11/13/2018   Procedure: [place];  Surgeon: Anastasio Auerbach, MD;  Location: [place];  Service: Gynecology;  Laterality: N/A;  request 9:00am OR start time in [facility]  block requests one hour  . DILATION AND CURETTAGE OF UTERUS  02/2019  . GANGLION CYST EXCISION     R wrist  . KNEE ARTHROSCOPY WITH ANTERIOR CRUCIATE LIGAMENT (ACL) REPAIR Right 10/2015  . LAPAROSCOPIC CHOLECYSTECTOMY  1997  . LAPAROSCOPIC HYSTERECTOMY Bilateral 04/29/2019   Procedure: HYSTERECTOMY TOTAL LAPAROSCOPIC, BILATERAL SALPINO-OOPHORECTOMY;  Surgeon: Joseph Pierini, MD;  Location: [place];  Service: Gynecology;  Laterality: Bilateral;  . TOOTH EXTRACTION  04/01/2019    There were no vitals filed for this visit.   Subjective Assessment - 08/18/19 1136    Subjective Patient reports her hip still hurts at night, she just got a thick cushion for her bed but hasn't tried it yet. Her knee is starting to feel better.    Patient Stated Goals Get rid of pain and move better    Currently in Pain? Yes    Pain Score 4     Pain Location Knee    Pain Orientation Left    Pain Descriptors / Indicators Aching    Pain Type Chronic pain    Pain Onset More than a month ago    Pain Frequency Intermittent    Pain Score 0   8/10 at night when lying on side   Pain Location Hip    Pain Orientation Left    Pain Descriptors / Indicators Aching;Burning    Pain Type Chronic  pain    Pain Onset More than a month ago    Pain Frequency Intermittent              [facility] PT Assessment - 08/18/19 0001      AROM   Left Knee Flexion 105      Strength   Left Hip ABduction 4-/5                         [facility] Adult PT Treatment/Exercise - 08/18/19 0001      Knee/Hip Exercises: Stretches   Hip Flexor Stretch 2 reps;30 seconds    Knee: Self-Stretch to increase Flexion 5 reps;10 seconds    Knee: Self-Stretch Limitations strap      Knee/Hip Exercises: Aerobic   Recumbent Bike L2 x 5 min       Knee/Hip Exercises: Seated   Sit to Sand 2 sets;10 reps   cued for proper hip hinge, slow decent     Knee/Hip Exercises: Supine   Bridges 3 sets;10 reps    Bridges  Limitations last 2 sets with green band around knees    Straight Leg Raises 2 sets;10 reps      Knee/Hip Exercises: Sidelying   Hip ABduction 2 sets;10 reps                  PT Education - 08/18/19 1139    Education Details HEP    Person(s) Educated Patient    Methods Explanation;Demonstration;Verbal cues    Comprehension Verbalized understanding;Returned demonstration;Verbal cues required;Need further instruction            PT Short Term Goals - 08/18/19 1211      PT SHORT TERM GOAL #1   Title Patient will be I with initial HEP to progress in PT    Time 4    Period Weeks    Status On-going    Target Date 08/18/19      PT SHORT TERM GOAL #2   Title Patient will report improved left hip pain to </= 4/10 and left knee pain to </= 5/10 to improve functional mobility    Time 4    Period Weeks    Status On-going    Target Date 08/18/19      PT SHORT TERM GOAL #3   Title Patient will exhibit improved left knee flexion >/= 115 deg to improve ability to perform sit<>stand and transfers    Time 4    Period Weeks    Status On-going    Target Date 08/18/19             PT Long Term Goals - 07/21/19 1136      PT LONG TERM GOAL #1   Title Patient will be I with final HEP to maintain progress from PT    Time 8    Period Weeks    Status New    Target Date 09/15/19      PT LONG TERM GOAL #2   Title Patient will demonstrate improved strength of left hip to >/= 4/5 MMT and left knee to >/= 5/5 MMT to improve ability to go up/down stairs    Time 8    Period Weeks    Status New    Target Date 09/15/19      PT LONG TERM GOAL #3   Title Patient will report no sleep disturbances due to left hip pain    Time 8    Period Weeks  Status New    Target Date 09/15/19      PT LONG TERM GOAL #4   Title Patient will demonstrated improve left knee active motion equal to opposite side and without pain to improve gait    Time 8    Period Weeks    Status New    Target  Date 09/15/19      PT LONG TERM GOAL #5   Title Patient will report improved functional level of </= 40% limitation on FOTO    Time 8    Period Weeks    Status New    Target Date 09/15/19                 Plan - 08/18/19 1208    Clinical Impression Statement Patient tolerated therapy well with no adverse effects. Continued working on left hip and LE strengthening and progressed HEP with good tolerance. She required cueing for proper technique with sit<>stand. She would benefit from continued skilled PT to improve her strength and mobility in order to reduce pain with activity and return to prior level of function.    PT Treatment/Interventions ADLs/Self Care Home Management;Cryotherapy;Electrical Stimulation;Iontophoresis 39m/ml Dexamethasone;Moist Heat;Ultrasound;Neuromuscular re-education;Balance training;Therapeutic exercise;Therapeutic activities;Functional mobility training;Stair training;Gait training;Patient/family education;Manual techniques;Dry needling;Passive range of motion;Taping;Joint Manipulations    PT Next Visit Plan review hip hinge for lifting, ADLs.  Post procedure ther ex for post eye surgery.  left knee flexion range of motion, progress left knee and hip strength, manual and modalities as needed for pain    PT Home Exercise Plan [id number] supine quad set, SLR, supine heel slide with strap, sidelying hip abduction, bridge with green band, sit<>stand, added hip flexor standing/supine    Consulted and Agree with Plan of Care Patient           Patient will benefit from skilled therapeutic intervention in order to improve the following deficits and impairments:  Abnormal gait, Decreased range of motion, Difficulty walking, Decreased activity tolerance, Pain, Decreased strength  Visit Diagnosis: Pain in left hip  Chronic pain of left knee  Stiffness of left knee, not elsewhere classified  Muscle weakness (generalized)  Other abnormalities of gait and  mobility     Problem List Patient Active Problem List   Diagnosis Date Noted  . History of postmenopausal bleeding 04/29/2019  . Polypharmacy 04/03/2019  . Emphysema, unspecified ([place] 05/17/2018  . Weakness 01/25/2017  . Chronic migraine 12/22/2014  . Encounter for other general counseling or advice on contraception 09/02/2014  . Bipolar disorder ([place] 10/12/2011  . Anxiety and depression 10/12/2011  . GERD (gastroesophageal reflux disease) 10/12/2011    CHilda Blades PT, DPT, LAT, ATC 08/18/19  12:15 PM Phone: [phone]: [facility] [place] [address]Phone: [phone]  Fax:  [phone] Name: Lauren VAHEYMRN: [id number] of Birth: [birth year]  PHYSICAL THERAPY DISCHARGE SUMMARY  Visits from Start of Care: 3  Current functional level related to goals / functional outcomes: See above   Remaining deficits: See above   Education / Equipment: HEP Plan: Patient agrees to discharge.  Patient goals were not met. Patient is being discharged due to not returning since the last visit.  ?????    CHilda Blades PT, DPT, LAT, ATC 10/30/19  3:14 PM Phone: [phone]: [phone]

## 2019-08-21 DIAGNOSIS — H2512 Age-related nuclear cataract, left eye: Secondary | ICD-10-CM | POA: Diagnosis not present

## 2019-08-29 NOTE — Telephone Encounter (Signed)
emgality approval received [place] [id number] thru 01-23-20 under mcr part D [phone], sent copy fo pharmcy.  She had not read the [employer]

## 2019-08-31 DIAGNOSIS — H2511 Age-related nuclear cataract, right eye: Secondary | ICD-10-CM | POA: Diagnosis not present

## 2019-09-02 ENCOUNTER — Encounter (HOSPITAL_COMMUNITY): Payer: Self-pay | Admitting: Emergency Medicine

## 2019-09-02 ENCOUNTER — Other Ambulatory Visit: Payer: Self-pay

## 2019-09-02 ENCOUNTER — Ambulatory Visit (INDEPENDENT_AMBULATORY_CARE_PROVIDER_SITE_OTHER): Payer: Medicare Other

## 2019-09-02 ENCOUNTER — Ambulatory Visit (HOSPITAL_COMMUNITY)
Admission: EM | Admit: 2019-09-02 | Discharge: 2019-09-02 | Disposition: A | Discharge status: Home / Self Care | Payer: Medicare Other | Attending: Family Medicine | Admitting: Family Medicine

## 2019-09-02 DIAGNOSIS — S299XXA Unspecified injury of thorax, initial encounter: Secondary | ICD-10-CM | POA: Diagnosis not present

## 2019-09-02 DIAGNOSIS — R0781 Pleurodynia: Secondary | ICD-10-CM
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
## 2019-09-02 NOTE — Discharge Instructions (Addendum)
Your x-ray was normal Most likely bad bruise or muscle strain. You can take the muscle relaxant as needed.  Follow up as needed for continued or worsening symptoms
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
## 2019-09-03 ENCOUNTER — Ambulatory Visit: Payer: Medicare Other | Admitting: Physical Therapy

## 2019-09-04 DIAGNOSIS — H2511 Age-related nuclear cataract, right eye: Secondary | ICD-10-CM | POA: Diagnosis not present

## 2019-09-16 ENCOUNTER — Other Ambulatory Visit: Payer: Self-pay

## 2019-09-16 ENCOUNTER — Encounter: Payer: Self-pay | Admitting: Family Medicine

## 2019-09-16 ENCOUNTER — Ambulatory Visit (INDEPENDENT_AMBULATORY_CARE_PROVIDER_SITE_OTHER): Payer: Medicare Other | Admitting: Family Medicine

## 2019-09-16 VITALS — BP 113/80 | HR 100 | Temp 98.2°F | Ht 62.5 in | Wt 182.4 lb

## 2019-09-16 DIAGNOSIS — M25552 Pain in left hip: Secondary | ICD-10-CM | POA: Diagnosis not present

## 2019-09-16 DIAGNOSIS — F17200 Nicotine dependence, unspecified, uncomplicated: Secondary | ICD-10-CM | POA: Diagnosis not present

## 2019-09-16 DIAGNOSIS — G8929 Other chronic pain: Secondary | ICD-10-CM | POA: Diagnosis not present

## 2019-09-16 MED ORDER — NICOTINE 14 MG/24HR TD PT24: 14.0000 mg | MEDICATED_PATCH | Freq: Every day | TRANSDERMAL | 1 refills | Status: DC

## 2019-09-16 NOTE — Progress Notes (Signed)
8/24/202111:02 AM  Lauren Chapman April 22, 1970, 49 y.o., female [id number]  Chief Complaint  Patient presents with  . Follow-up    follow up on hip, still having pain. going to pt and sport meds, not working  . Nicotine Dependence    wants info on quitting smoking    HPI:   Patient is a 49 y.o. female who presents today for left hip/knee pain  Last seen in may 2021 - referred to sports medicine Last seen in July 2021 - rx for lidoderm patch, alternating aleve and APAP, cont PT, cont knee sleeve, consider inj Knee xray: superior patella enthesophyte No hip xray as pain does not radiate into groin Last PT session August 18 2019 - 3rd visit She is doing HEP twice a day, she is not doing stretches Use of roller on IT band has been the most beneficial  Has tried chantix and wellbutrin the the past - made mood worse Smokes about 6-9 cig a day, smoking since age 30, most she smoked was 1.5 ppd, has quit several times in the past She has never used patches She does speak with therapist about it  She had cataract surgery in both eyes and doing great  Depression screen [facility] 2/9 06/13/2019 03/21/2019 03/17/2019  Decreased Interest 0 1 1  Down, Depressed, Hopeless 0 1 1  PHQ - 2 Score 0 2 2  Altered sleeping - 0 0  Tired, decreased energy - 1 0  Change in appetite - 0 0  Feeling bad or failure about yourself  - 1 1  Trouble concentrating - 0 0  Moving slowly or fidgety/restless - 0 0  Suicidal thoughts - 0 0  PHQ-9 Score - 4 3  Difficult doing work/chores - - Somewhat difficult  Some recent data might be hidden    Fall Risk  06/20/2019 06/13/2019 03/21/2019 03/17/2019 11/05/2018  Falls in the past year? 0 0 1 1 1   Comment - - - - -  Number falls in past yr: 0 0 0 0 0  Comment - - - - -  Injury with Fall? 0 1 1 1 1   Comment - broke a front tooth and back tooth w/ bruises to face - broke 3 teeth, shoulder bruised. fell down steps 6 months ago  Risk for fall due to : - - - - -  Risk  for fall due to: Comment - - - - -  Follow up - Falls evaluation completed Falls evaluation completed Falls evaluation completed;Education provided -     Allergies  Allergen Reactions  . Codeine Anaphylaxis    Anything with codeine    Prior to Admission medications   Medication Sig Start Date End Date Taking? Authorizing Provider  albuterol (PROVENTIL HFA;VENTOLIN HFA) 108 (90 Base) MCG/ACT inhaler Inhale 2 puffs into the lungs every 4 (four) hours as needed for wheezing or shortness of breath. 01/30/18  Yes Olalere, Adewale A, MD  clonazePAM (KLONOPIN) 0.5 MG tablet Take 0.5 mg by mouth 3 (three) times daily.  12/04/17  Yes [provider]  esomeprazole (NEXIUM) 20 MG capsule Take 1 capsule (20 mg total) by mouth 2 (two) times daily before a meal. 03/21/19  Yes Rutherford Guys, MD  gabapentin (NEURONTIN) 800 MG tablet Take 800 mg by mouth 3 (three) times daily.  07/02/18  Yes [provider]  Galcanezumab-gnlm (EMGALITY) 120 MG/ML SOAJ Inject 120 mg into the skin every 30 (thirty) days. 07/23/19  Yes Suzzanne Cloud, NP  lamoTRIgine (  LAMICTAL) 150 MG tablet Take 150 mg by mouth 2 (two) times daily.    Yes [provider]  lidocaine (LIDODERM) 5 % Place 1 patch onto the skin daily. Remove & Discard patch within 12 hours or as directed by MD 07/31/19  Yes Milus Banister C, DO  meloxicam (MOBIC) 15 MG tablet TAKE 1 TABLET(15 MG) BY MOUTH DAILY 07/16/19  Yes Rutherford Guys, MD  methocarbamol (ROBAXIN) 500 MG tablet Take 1 tablet (500 mg total) by mouth every 8 (eight) hours as needed for muscle spasms. 06/20/19  Yes Rutherford Guys, MD  mirtazapine (REMERON) 15 MG tablet Take 15 mg by mouth at bedtime.   Yes [provider]  ondansetron (ZOFRAN) 4 MG tablet Take 1 tablet (4 mg total) by mouth every 8 (eight) hours as needed for nausea or vomiting. 04/03/19  Yes Marcial Pacas, MD  rizatriptan (MAXALT) 10 MG tablet Take 1 tab at onset of migraine.  May repeat in 2 hrs, if  needed.  Max dose: 2 tabs/day. This is a 30 day prescription. 07/03/19  Yes Suzzanne Cloud, NP  sertraline (ZOLOFT) 100 MG tablet Take 100 mg by mouth daily. Taking 3 tabs in am   Yes [provider]  tiZANidine (ZANAFLEX) 4 MG tablet Take 1 tablet (4 mg total) by mouth every 6 (six) hours as needed for muscle spasms. TAKE 1 TABLET BY MOUTH EVERY 6 HOURS AS NEEDED FOR MUSCLE SPASMS. 04/03/19  Yes Marcial Pacas, MD  VRAYLAR 6 MG CAPS Take 1 capsule by mouth daily. 06/17/19  Yes [provider]  fluticasone (FLONASE) 50 MCG/ACT nasal spray Place 2 sprays into both nostrils daily. Patient not taking: Reported on 09/26/2018 09/24/18 10/22/18  Rutherford Guys, MD    Past Medical History:  Diagnosis Date  . Anxiety   . Bipolar affective psychosis ([place])   . Complication of anesthesia    " Acting silly- waving at everyone"-Giddy  . Depression   . Emphysema lung ([facility])    inhaler prn,  followed by pcp and pulmonologist-- dr Margorie John  . Family history of adverse reaction to anesthesia    Father had rheumatic fever, and died on operating table.  Marland Kitchen GERD (gastroesophageal reflux disease)   . Migraine   . Smokers' cough ([place])    per pt not productive  . SUI (stress urinary incontinence, female)   . Wears glasses     Past Surgical History:  Procedure Laterality Date  . CARPAL TUNNEL RELEASE Right 09-12-2007   @[facility]    and GANGLION CYST EXCISION  . CYSTOSCOPY N/A 04/29/2019   Procedure: CYSTOSCOPY;  Surgeon: Joseph Pierini, MD;  Location: [facility];  Service: Gynecology;  Laterality: N/A;  . DILATATION & CURETTAGE/HYSTEROSCOPY WITH MYOSURE N/A 11/13/2018   Procedure: [place];  Surgeon: Anastasio Auerbach, MD;  Location: [place];  Service: Gynecology;  Laterality: N/A;  request 9:00am OR start time in [place] [facility] block requests one hour  . DILATION AND CURETTAGE OF UTERUS  02/2019  . GANGLION CYST EXCISION      R wrist  . KNEE ARTHROSCOPY WITH ANTERIOR CRUCIATE LIGAMENT (ACL) REPAIR Right 10/2015  . LAPAROSCOPIC CHOLECYSTECTOMY  1997  . LAPAROSCOPIC HYSTERECTOMY Bilateral 04/29/2019   Procedure: HYSTERECTOMY TOTAL LAPAROSCOPIC, BILATERAL SALPINO-OOPHORECTOMY;  Surgeon: Joseph Pierini, MD;  Location: [place];  Service: Gynecology;  Laterality: Bilateral;  . TOOTH EXTRACTION  04/01/2019    Social History   Tobacco Use  . Smoking  status: Current Every Day Smoker    Packs/day: 0.50    Years: 33.00    Pack years: 16.50    Types: Cigarettes    Start date: 10/10/1981  . Smokeless tobacco: Never Used  . Tobacco comment: 04/03/19   per pt down to 4-5 cig per day  Substance Use Topics  . Alcohol use: Never    Alcohol/week: 0.0 standard drinks    Family History  Problem Relation Age of Onset  . Depression Mother   . Cancer Mother        Cervical  . Alcohol abuse Father   . Crohn's disease Sister   . Stroke Brother 56  . Migraines Daughter   . GER disease Daughter   . Depression Daughter   . Migraines Daughter   . GER disease Daughter     ROS Per hpi  OBJECTIVE:  Today's Vitals   09/16/19 1050  BP: 113/80  Pulse: 100  Temp: 98.2 F (36.8 C)  SpO2: 100%  Weight: 182 lb 6.4 oz (82.7 kg)  Height: 5' 2.5" (1.588 m)   Body mass index is 32.83 kg/m.   Physical Exam Vitals and nursing note reviewed.  Constitutional:      Appearance: She is well-developed.  HENT:     Head: Normocephalic and atraumatic.  Eyes:     General: No scleral icterus.    Conjunctiva/sclera: Conjunctivae normal.     Pupils: Pupils are equal, round, and reactive to light.  Pulmonary:     Effort: Pulmonary effort is normal.  Musculoskeletal:     Cervical back: Neck supple.  Skin:    General: Skin is warm and dry.  Neurological:     Mental Status: She is alert and oriented to person, place, and time.     No results found for this or any previous visit (from the past 24  hour(s)).  No results found.   ASSESSMENT and PLAN  1. Chronic left hip pain Not improving despite HEP, nsaids, APAP and lidoderm patches. Discussed purchasing a foam roller and specific stretched for IT band. Advise fu with sports med for possible injection  2. Current smoker Smoking cessation instruction/counseling given for > 10 minutes:  counseled patient on the dangers of tobacco use, advised patient to stop smoking, and reviewed strategies to maximize success   Other orders - nicotine (NICODERM CQ - DOSED IN MG/24 HOURS) 14 mg/24hr patch; Place 1 patch (14 mg total) onto the skin daily.  Return in about 2 months (around 11/16/2019) for smoking cessation.    Rutherford Guys, MD [facility] [place], [place] [address] Ph.  [phone] Fax [phone]

## 2019-09-16 NOTE — Patient Instructions (Addendum)
If you have lab work done today you will be contacted with your lab results within the next 2 weeks.  If you have not heard from [place] then please contact us. The fastest way to get your results is to register for My Chart.   IF you received an x-ray today, you will receive an invoice from [facility]. Please contact [facility] at [phone] with questions or concerns regarding your invoice.   IF you received labwork today, you will receive an invoice from [facility]. Please contact [facility] at [phone] with questions or concerns regarding your invoice.   Our billing staff will not be able to assist you with questions regarding bills from these companies.  You will be contacted with the lab results as soon as they are available. The fastest way to get your results is to activate your My Chart account. Instructions are located on the last page of this paperwork. If you have not heard from [place] regarding the results in 2 weeks, please contact this office.     Coping with Quitting Smoking  Quitting smoking is a physical and mental challenge. You will face cravings, withdrawal symptoms, and temptation. Before quitting, work with your health care provider to make a plan that can help you cope. Preparation can help you quit and keep you from giving in. How can I cope with cravings? Cravings usually last for 5-10 minutes. If you get through it, the craving will pass. Consider taking the following actions to help you cope with cravings:  Keep your mouth busy: ? Chew sugar-free gum. ? Suck on hard candies or a straw. ? Brush your teeth.  Keep your hands and body busy: ? Immediately change to a different activity when you feel a craving. ? Squeeze or play with a ball. ? Do an activity or a hobby, like making bead jewelry, practicing needlepoint, or working with wood. ? Mix up your normal routine. ? Take a short exercise break. Go for a quick walk or run up and down  stairs. ? Spend time in public places where smoking is not allowed.  Focus on doing something kind or helpful for someone else.  Call a friend or family member to talk during a craving.  Join a support group.  Call a quit line, such as 1-800-QUIT-NOW.  Talk with your health care provider about medicines that might help you cope with cravings and make quitting easier for you. How can I deal with withdrawal symptoms? Your body may experience negative effects as it tries to get used to not having nicotine in the system. These effects are called withdrawal symptoms. They may include:  Feeling hungrier than normal.  Trouble concentrating.  Irritability.  Trouble sleeping.  Feeling depressed.  Restlessness and agitation.  Craving a cigarette. To manage withdrawal symptoms:  Avoid places, people, and activities that trigger your cravings.  Remember why you want to quit.  Get plenty of sleep.  Avoid coffee and other caffeinated drinks. These may worsen some of your symptoms. How can I handle social situations? Social situations can be difficult when you are quitting smoking, especially in the first few weeks. To manage this, you can:  Avoid parties, bars, and other social situations where people might be smoking.  Avoid alcohol.  Leave right away if you have the urge to smoke.  Explain to your family and friends that you are quitting smoking. Ask for understanding and support.  Plan activities with friends or family where smoking is  not an option. What are some ways I can cope with stress? Wanting to smoke may cause stress, and stress can make you want to smoke. Find ways to manage your stress. Relaxation techniques can help. For example:  Breathe slowly and deeply, in through your nose and out through your mouth.  Listen to soothing, relaxing music.  Talk with a family member or friend about your stress.  Light a candle.  Soak in a bath or take a shower.  Think  about a peaceful place. What are some ways I can prevent weight gain? Be aware that many people gain weight after they quit smoking. However, not everyone does. To keep from gaining weight, have a plan in place before you quit and stick to the plan after you quit. Your plan should include:  Having healthy snacks. When you have a craving, it may help to: ? Eat plain popcorn, crunchy carrots, celery, or other cut vegetables. ? Chew sugar-free gum.  Changing how you eat: ? Eat small portion sizes at meals. ? Eat 4-6 small meals throughout the day instead of 1-2 large meals a day. ? Be mindful when you eat. Do not watch television or do other things that might distract you as you eat.  Exercising regularly: ? Make time to exercise each day. If you do not have time for a long workout, do short bouts of exercise for 5-10 minutes several times a day. ? Do some form of strengthening exercise, like weight lifting, and some form of aerobic exercise, like running or swimming.  Drinking plenty of water or other low-calorie or no-calorie drinks. Drink 6-8 glasses of water daily, or as much as instructed by your health care provider. Summary  Quitting smoking is a physical and mental challenge. You will face cravings, withdrawal symptoms, and temptation to smoke again. Preparation can help you as you go through these challenges.  You can cope with cravings by keeping your mouth busy (such as by chewing gum), keeping your body and hands busy, and making calls to family, friends, or a helpline for people who want to quit smoking.  You can cope with withdrawal symptoms by avoiding places where people smoke, avoiding drinks with caffeine, and getting plenty of rest.  Ask your health care provider about the different ways to prevent weight gain, avoid stress, and handle social situations. This information is not intended to replace advice given to you by your health care provider. Make sure you discuss any  questions you have with your health care provider. Document Revised: 12/22/2016 Document Reviewed: 01/07/2016 Elsevier Patient Education  2020 [employer].

## 2019-10-14 ENCOUNTER — Other Ambulatory Visit: Payer: Self-pay

## 2019-10-14 ENCOUNTER — Emergency Department (HOSPITAL_COMMUNITY)
Admission: EM | Admit: 2019-10-14 | Discharge: 2019-10-14 | Disposition: A | Discharge status: Home / Self Care | Payer: Medicare Other | Attending: Emergency Medicine | Admitting: Emergency Medicine

## 2019-10-14 ENCOUNTER — Encounter (HOSPITAL_COMMUNITY): Payer: Self-pay

## 2019-10-14 DIAGNOSIS — G43109 Migraine with aura, not intractable, without status migrainosus: Secondary | ICD-10-CM | POA: Diagnosis not present

## 2019-10-14 DIAGNOSIS — G43909 Migraine, unspecified, not intractable, without status migrainosus: Secondary | ICD-10-CM | POA: Insufficient documentation

## 2019-10-14 DIAGNOSIS — F17210 Nicotine dependence, cigarettes, uncomplicated: Secondary | ICD-10-CM | POA: Insufficient documentation

## 2019-10-14 MED ORDER — KETOROLAC TROMETHAMINE 15 MG/ML IJ SOLN
15.0000 mg | Freq: Once | INTRAMUSCULAR | Status: AC
  Administered 2019-10-14: 15 mg via INTRAVENOUS
  Filled 2019-10-14: qty 1

## 2019-10-14 MED ORDER — DEXAMETHASONE SODIUM PHOSPHATE 10 MG/ML IJ SOLN
10.0000 mg | Freq: Once | INTRAMUSCULAR | Status: AC
  Administered 2019-10-14: 10 mg via INTRAVENOUS
  Filled 2019-10-14: qty 1

## 2019-10-14 MED ORDER — DIPHENHYDRAMINE HCL 50 MG/ML IJ SOLN
25.0000 mg | Freq: Once | INTRAMUSCULAR | Status: AC
  Administered 2019-10-14: 25 mg via INTRAVENOUS
  Filled 2019-10-14: qty 1

## 2019-10-14 MED ORDER — SODIUM CHLORIDE 0.9 % IV BOLUS
500.0000 mL | Freq: Once | INTRAVENOUS | Status: AC
  Administered 2019-10-14: 500 mL via INTRAVENOUS

## 2019-10-14 MED ORDER — METOCLOPRAMIDE HCL 5 MG/ML IJ SOLN
10.0000 mg | Freq: Once | INTRAMUSCULAR | Status: AC
  Administered 2019-10-14: 10 mg via INTRAVENOUS
  Filled 2019-10-14: qty 2

## 2019-10-14 NOTE — ED Triage Notes (Signed)
Pt arrived via walk in, c/o migraine x3 days. Hx of migraines, states her normal medications at home have not helped at all. Endorses some nausea, light sensitivity, and aura. Aox4

## 2019-10-14 NOTE — ED Provider Notes (Signed)
[facility] DEPT Provider Note   CSN: [id number] Arrival date & time: 10/14/19  1158     History Chief Complaint  Patient presents with  . Migraine    Lauren Chapman is a 49 y.o. female with a history of chronic migraines who follows with a neurologist, presenting to the emergency department with breakthrough migraine pain.  Patient reports that she gets migraines about twice a month on average, which is dramatic improvement from her prior history.  Her migraines typically last about 2 days and then go away.  However her current migraines been lasting 4 days, does not feel to be getting better.  She has a throbbing pain in the middle of her head.  She endorses some lightheadedness, nausea, light sensitivity and an aura sensation.  These are all typical symptoms of her migraines.  She denies fevers or chills.  She is vaccinated for Covid.  She tried taking her abortive medicine which is rizatriptan BID while symptomatic, and also zanaflex BID while symptomatic.  She also took 1 excedrin this morning, although she is no longer taking regular or daily excedrin due to rebound headaches in the past.  She also reports compliance with her other meds including Emgality injections monthly for migraines.  Her neurologist is Dr Krista Blue.  Allergies to codeine   HPI     Past Medical History:  Diagnosis Date  . Anxiety   . Bipolar affective psychosis ([place])   . Complication of anesthesia    " Acting silly- waving at everyone"-Giddy  . Depression   . Emphysema lung ([facility])    inhaler prn,  followed by pcp and pulmonologist-- dr Margorie John  . Family history of adverse reaction to anesthesia    Father had rheumatic fever, and died on operating table.  Marland Kitchen GERD (gastroesophageal reflux disease)   . Migraine   . Smokers' cough ([place])    per pt not productive  . SUI (stress urinary incontinence, female)   . Wears glasses     Patient Active Problem List   Diagnosis Date  Noted  . History of postmenopausal bleeding 04/29/2019  . Polypharmacy 04/03/2019  . Emphysema, unspecified ([place]) 05/17/2018  . Weakness 01/25/2017  . Chronic migraine 12/22/2014  . Encounter for other general counseling or advice on contraception 09/02/2014  . Bipolar disorder ([place]) 10/12/2011  . Anxiety and depression 10/12/2011  . GERD (gastroesophageal reflux disease) 10/12/2011    Past Surgical History:  Procedure Laterality Date  . CARPAL TUNNEL RELEASE Right 09-12-2007   @[facility]    and GANGLION CYST EXCISION  . CYSTOSCOPY N/A 04/29/2019   Procedure: CYSTOSCOPY;  Surgeon: Joseph Pierini, MD;  Location: [facility];  Service: Gynecology;  Laterality: N/A;  . DILATATION & CURETTAGE/HYSTEROSCOPY WITH MYOSURE N/A 11/13/2018   Procedure: [place];  Surgeon: Anastasio Auerbach, MD;  Location: [facility];  Service: Gynecology;  Laterality: N/A;  request 9:00am OR start time in [facility] block requests one hour  . DILATION AND CURETTAGE OF UTERUS  02/2019  . GANGLION CYST EXCISION     R wrist  . KNEE ARTHROSCOPY WITH ANTERIOR CRUCIATE LIGAMENT (ACL) REPAIR Right 10/2015  . LAPAROSCOPIC CHOLECYSTECTOMY  1997  . LAPAROSCOPIC HYSTERECTOMY Bilateral 04/29/2019   Procedure: HYSTERECTOMY TOTAL LAPAROSCOPIC, BILATERAL SALPINO-OOPHORECTOMY;  Surgeon: Joseph Pierini, MD;  Location: [facility];  Service: Gynecology;  Laterality: Bilateral;  . TOOTH EXTRACTION  04/01/2019     OB History    Gravida  3  Para  2   Term  2   Preterm  0   AB  1   Living  2     SAB  1   TAB  0   Ectopic  0   Multiple  0   Live Births              Family History  Problem Relation Age of Onset  . Depression Mother   . Cancer Mother        Cervical  . Alcohol abuse Father   . Crohn's disease Sister   . Stroke Brother 10  . Migraines Daughter   . GER disease Daughter   . Depression Daughter   .  Migraines Daughter   . GER disease Daughter     Social History   Tobacco Use  . Smoking status: Current Every Day Smoker    Packs/day: 0.50    Years: 33.00    Pack years: 16.50    Types: Cigarettes    Start date: 10/10/1981  . Smokeless tobacco: Never Used  . Tobacco comment: 04/03/19   per pt down to 4-5 cig per day  Vaping Use  . Vaping Use: Never used  Substance Use Topics  . Alcohol use: Never    Alcohol/week: 0.0 standard drinks  . Drug use: Never    Home Medications Prior to Admission medications   Medication Sig Start Date End Date Taking? Authorizing Provider  albuterol (PROVENTIL HFA;VENTOLIN HFA) 108 (90 Base) MCG/ACT inhaler Inhale 2 puffs into the lungs every 4 (four) hours as needed for wheezing or shortness of breath. 01/30/18  Yes Olalere, Ernesto Rutherford, MD  aspirin-acetaminophen-caffeine (EXCEDRIN MIGRAINE) 250-250-65 MG tablet Take 2 tablets by mouth every 6 (six) hours as needed for headache.   Yes [provider]  clonazePAM (KLONOPIN) 0.5 MG tablet Take 0.5 mg by mouth 3 (three) times daily.  12/04/17  Yes [provider]  esomeprazole (NEXIUM) 20 MG capsule Take 1 capsule (20 mg total) by mouth 2 (two) times daily before a meal. Patient taking differently: Take 20 mg by mouth 2 (two) times daily as needed (indigestion).  03/21/19  Yes Rutherford Guys, MD  gabapentin (NEURONTIN) 800 MG tablet Take 800 mg by mouth 3 (three) times daily.  07/02/18  Yes [provider]  Galcanezumab-gnlm (EMGALITY) 120 MG/ML SOAJ Inject 120 mg into the skin every 30 (thirty) days. 07/23/19  Yes Suzzanne Cloud, NP  Glucosamine HCl (GLUCOSAMINE PO) Take 1 tablet by mouth daily as needed (arthritis).   Yes [provider]  lamoTRIgine (LAMICTAL) 150 MG tablet Take 150 mg by mouth 2 (two) times daily.    Yes [provider]  mirtazapine (REMERON) 15 MG tablet Take 15 mg by mouth at bedtime.   Yes [provider]  rizatriptan (MAXALT) 10 MG  tablet Take 1 tab at onset of migraine.  May repeat in 2 hrs, if needed.  Max dose: 2 tabs/day. This is a 30 day prescription. Patient taking differently: Take 10 mg by mouth as needed for migraine. Take 1 tab at onset of migraine.  May repeat in 2 hrs, if needed.  Max dose: 2 tabs/day. This is a 30 day prescription. 07/03/19  Yes Suzzanne Cloud, NP  sertraline (ZOLOFT) 100 MG tablet Take 300 mg by mouth daily.    Yes [provider]  tiZANidine (ZANAFLEX) 4 MG tablet Take 1 tablet (4 mg total) by mouth every 6 (six) hours as needed for muscle spasms.  TAKE 1 TABLET BY MOUTH EVERY 6 HOURS AS NEEDED FOR MUSCLE SPASMS. 04/03/19  Yes Marcial Pacas, MD  VRAYLAR 6 MG CAPS Take 6 mg by mouth daily.  06/17/19  Yes [provider]  lidocaine (LIDODERM) 5 % Place 1 patch onto the skin daily. Remove & Discard patch within 12 hours or as directed by MD Patient not taking: Reported on 10/14/2019 07/31/19   Daisy Floro, DO  nicotine (NICODERM CQ - DOSED IN MG/24 HOURS) 14 mg/24hr patch Place 1 patch (14 mg total) onto the skin daily. Patient not taking: Reported on 10/14/2019 09/16/19   Rutherford Guys, MD  ondansetron (ZOFRAN) 4 MG tablet Take 1 tablet (4 mg total) by mouth every 8 (eight) hours as needed for nausea or vomiting. Patient not taking: Reported on 10/14/2019 04/03/19   Marcial Pacas, MD  fluticasone [facility]) 50 MCG/ACT nasal spray Place 2 sprays into both nostrils daily. Patient not taking: Reported on 09/26/2018 09/24/18 10/22/18  Rutherford Guys, MD    Allergies    Codeine  Review of Systems   Review of Systems  Constitutional: Negative for chills and fever.  HENT: Negative for ear pain and sore throat.   Eyes: Positive for photophobia. Negative for pain and visual disturbance.  Respiratory: Negative for cough and shortness of breath.   Cardiovascular: Negative for chest pain and palpitations.  Gastrointestinal: Positive for nausea. Negative for abdominal pain and vomiting.    Genitourinary: Negative for dysuria and hematuria.  Musculoskeletal: Negative for arthralgias and neck pain.  Skin: Negative for color change and rash.  Neurological: Positive for light-headedness and headaches. Negative for seizures, syncope, facial asymmetry, speech difficulty and weakness.  Psychiatric/Behavioral: Negative for agitation and confusion.  All other systems reviewed and are negative.   Physical Exam Updated Vital Signs BP (!) 104/59 (BP Location: Right Arm)   Pulse (!) 56   Temp 97.6 F (36.4 C) (Oral)   Resp 18   Ht 5\' 3"  (1.6 m)   Wt 80 kg   LMP 05/02/2019 Comment: irregular cycles  SpO2 97%   BMI 31.24 kg/m   Physical Exam Vitals and nursing note reviewed.  Constitutional:      General: She is not in acute distress.    Appearance: She is well-developed.  HENT:     Head: Normocephalic and atraumatic.  Eyes:     Conjunctiva/sclera: Conjunctivae normal.     Pupils: Pupils are equal, round, and reactive to light.  Cardiovascular:     Rate and Rhythm: Normal rate and regular rhythm.     Pulses: Normal pulses.  Pulmonary:     Effort: Pulmonary effort is normal. No respiratory distress.  Musculoskeletal:     Cervical back: Neck supple.  Skin:    General: Skin is warm and dry.  Neurological:     General: No focal deficit present.     Mental Status: She is alert and oriented to person, place, and time.  Psychiatric:        Mood and Affect: Mood normal.        Behavior: Behavior normal.     ED Results / Procedures / Treatments   Labs (all labs ordered are listed, but only abnormal results are displayed) Labs Reviewed - No data to display  EKG None  Radiology No results found.  Procedures Procedures (including critical care time)  Medications Ordered in ED Medications  ketorolac (TORADOL) 15 MG/ML injection 15 mg (15 mg Intravenous Given 10/14/19 1244)  metoCLOPramide (REGLAN) injection  10 mg (10 mg Intravenous Given 10/14/19 1243)   dexamethasone (DECADRON) injection 10 mg (10 mg Intravenous Given 10/14/19 1243)  diphenhydrAMINE (BENADRYL) injection 25 mg (25 mg Intravenous Given 10/14/19 1242)  sodium chloride 0.9 % bolus 500 mL (0 mLs Intravenous Stopped 10/14/19 1339)    ED Course  I have reviewed the triage vital signs and the nursing notes.  Pertinent labs & imaging results that were available during my care of the patient were reviewed by me and considered in my medical decision making (see chart for details).  This is a 39 female presented to emergency department with recurrence of her chronic migraines.  This is not a typical pattern for her headaches.  I have a low suspicion therefore for intracranial hemorrhage or meningitis at this time.  She has an overall benign appearance on exam, with no focal neurological deficits.  We discussed emergency medications for migraines, and we will proceed with IV Decadron, IV Benadryl, IV Toradol, and IV Reglan, as well as a small bolus of fluids.  I will reassess her afterwards.  I personally reviewed his medication list an April 05 2019 office note with Dr Krista Blue, her neurologist, who noted normal MRI brain (Jan 2019) and EMG performed as outpatient in the past, and stated.  " Update April 04, 2019: Astin came back for follow-up after a long gap following last visit in January 2019, complains of worsening migraine headaches, she is still on polypharmacy for her bipolar disorder, Zoloft 200 mg every day, Remeron 15 mg at bedtime, lamotrigine 150 mg twice a day, gabapentin 800 mg 3 times a day, clonazepam 0.5 mg 3 times a day, Vraylar 4.5 mg at bedtime  She has used up her migraine preventive medications, Maxalt, Zofran, tizanidine, patient stated dose medicine works well for her migraine  She now complains of migraine on a daily basis, has been taking multiple dose of Excedrin Migraine over the past few months, today she came in with 8 out of 10 holoacranial headaches been  ongoing for a week, try to hold medications without improvement, previous attempt for Aimovig as migraine premention was not approved by her insurance company"  Clinical Course as of Oct 13 1613  Tue Oct 14, 2019  1351 Patient reports that her headache is dramatically improved.  She wants to go home.  We can discharge her.   [MT]    Clinical Course User Index [MT] Emmalin Jaquess, Carola Rhine, MD    Final Clinical Impression(s) / ED Diagnoses Final diagnoses:  Migraine with aura and without status migrainosus, not intractable    Rx / DC Orders ED Discharge Orders    None       Wyvonnia Dusky, MD 10/14/19 1615

## 2019-10-14 NOTE — Discharge Instructions (Addendum)
Please follow-up with your neurologist about your headache problems.

## 2019-10-27 ENCOUNTER — Ambulatory Visit (INDEPENDENT_AMBULATORY_CARE_PROVIDER_SITE_OTHER): Payer: Medicare Other | Admitting: Sports Medicine

## 2019-10-27 ENCOUNTER — Other Ambulatory Visit: Payer: Self-pay

## 2019-10-27 VITALS — BP 100/53 | Ht 63.0 in | Wt 175.0 lb

## 2019-10-27 DIAGNOSIS — M25562 Pain in left knee: Secondary | ICD-10-CM | POA: Diagnosis not present

## 2019-10-27 DIAGNOSIS — G8929 Other chronic pain: Secondary | ICD-10-CM | POA: Diagnosis not present

## 2019-10-27 MED ORDER — METHYLPREDNISOLONE ACETATE 40 MG/ML IJ SUSP
40.0000 mg | Freq: Once | INTRAMUSCULAR | Status: AC
  Administered 2019-10-27: 40 mg via INTRA_ARTICULAR

## 2019-10-27 NOTE — Progress Notes (Signed)
   Subjective:    Patient ID: Lauren Chapman, female    DOB: 11/13/1970, 49 y.o.   MRN: [id number]  HPI   Patient comes in today with persistent left knee pain.  Previous x-rays showed well-preserved joint space but a small spur off of the patella.  Her pain is in the anterior knee and retropatellar.  Worse at night and first thing in the morning.  Also worse with going up and down stairs.  Physical therapy has been minimally helpful.  She has not noticed significant swelling.    Review of Systems As above    Objective:   Physical Exam  Developed, well nourished.  No acute distress.  Left knee: Range of motion is 0 to 120 degrees.  No obvious effusion.  Pain with patellar compression.  Slight tenderness along medial and lateral joint lines.  Knee is stable to ligamentous exam.  Neurovascularly intact distally.      Assessment & Plan:   Left knee pain likely secondary to chondromalacia patella/patellofemoral DJD  I recommended trying a cortisone injection.  Patient is amendable to this idea.  Left knee is injected today using an anterior lateral approach after risks and benefits were explained.  She tolerates this without difficulty.  If pain persists despite today's injection, consider further diagnostic imaging.  Follow-up for ongoing or recalcitrant issues.  Consent obtained and verified. Time-out conducted. Noted no overlying erythema, induration, or other signs of local infection. Skin prepped in a sterile fashion. Topical analgesic spray: Ethyl chloride. Joint: left knee Needle: 25g 1.5 inch Completed without difficulty. Meds: 3cc 1% xylocaine, 1cc (40mg ) depomedrol  Advised to call if fevers/chills, erythema, induration, drainage, or persistent bleeding.

## 2019-11-11 ENCOUNTER — Other Ambulatory Visit: Payer: Self-pay

## 2019-11-11 ENCOUNTER — Encounter (HOSPITAL_COMMUNITY): Payer: Self-pay | Admitting: Emergency Medicine

## 2019-11-11 ENCOUNTER — Ambulatory Visit (INDEPENDENT_AMBULATORY_CARE_PROVIDER_SITE_OTHER): Payer: Medicare Other | Admitting: Pulmonary Disease

## 2019-11-11 ENCOUNTER — Emergency Department (HOSPITAL_COMMUNITY)
Admission: EM | Admit: 2019-11-11 | Discharge: 2019-11-11 | Disposition: A | Discharge status: Home / Self Care | Payer: Medicare Other | Attending: Emergency Medicine | Admitting: Emergency Medicine

## 2019-11-11 ENCOUNTER — Telehealth: Payer: Self-pay | Admitting: Pulmonary Disease

## 2019-11-11 ENCOUNTER — Encounter: Payer: Self-pay | Admitting: Pulmonary Disease

## 2019-11-11 VITALS — BP 124/76 | HR 86 | Temp 98.2°F | Ht 62.0 in | Wt 175.2 lb

## 2019-11-11 DIAGNOSIS — R6889 Other general symptoms and signs: Secondary | ICD-10-CM | POA: Diagnosis not present

## 2019-11-11 DIAGNOSIS — F17210 Nicotine dependence, cigarettes, uncomplicated: Secondary | ICD-10-CM | POA: Diagnosis not present

## 2019-11-11 DIAGNOSIS — R0902 Hypoxemia: Secondary | ICD-10-CM | POA: Diagnosis not present

## 2019-11-11 DIAGNOSIS — R0602 Shortness of breath: Secondary | ICD-10-CM | POA: Diagnosis not present

## 2019-11-11 DIAGNOSIS — Z5321 Procedure and treatment not carried out due to patient leaving prior to being seen by health care provider: Secondary | ICD-10-CM | POA: Diagnosis not present

## 2019-11-11 DIAGNOSIS — J449 Chronic obstructive pulmonary disease, unspecified: Secondary | ICD-10-CM | POA: Diagnosis not present

## 2019-11-11 DIAGNOSIS — J209 Acute bronchitis, unspecified: Secondary | ICD-10-CM

## 2019-11-11 DIAGNOSIS — Z743 Need for continuous supervision: Secondary | ICD-10-CM | POA: Diagnosis not present

## 2019-11-11 DIAGNOSIS — G8929 Other chronic pain: Secondary | ICD-10-CM

## 2019-11-11 LAB — CBC
HCT: 40.4 % (ref 36.0–46.0)
Hemoglobin: 13.3 g/dL (ref 12.0–15.0)
MCH: 30.2 pg (ref 26.0–34.0)
MCHC: 32.9 g/dL (ref 30.0–36.0)
MCV: 91.6 fL (ref 80.0–100.0)
Platelets: 255 10*3/uL (ref 150–400)
RBC: 4.41 MIL/uL (ref 3.87–5.11)
RDW: 14.8 % (ref 11.5–15.5)
WBC: 8.2 10*3/uL (ref 4.0–10.5)
nRBC: 0 % (ref 0.0–0.2)

## 2019-11-11 LAB — BASIC METABOLIC PANEL
Anion gap: 11 (ref 5–15)
BUN: 6 mg/dL (ref 6–20)
CO2: 28 mmol/L (ref 22–32)
Calcium: 9.4 mg/dL (ref 8.9–10.3)
Chloride: 102 mmol/L (ref 98–111)
Creatinine, Ser: 0.97 mg/dL (ref 0.44–1.00)
GFR, Estimated: >60 mL/min (ref >60)
Glucose, Bld: 99 mg/dL (ref 70–99)
Potassium: 4.4 mmol/L (ref 3.5–5.1)
Sodium: 141 mmol/L (ref 135–145)

## 2019-11-11 MED ORDER — AMOXICILLIN-POT CLAVULANATE 875-125 MG PO TABS: 1.0000 | ORAL_TABLET | Freq: Two times a day (BID) | ORAL | 0 refills | Status: DC

## 2019-11-11 MED ORDER — PREDNISONE 20 MG PO TABS: 20.0000 mg | ORAL_TABLET | Freq: Every day | ORAL | 0 refills | Status: DC

## 2019-11-11 NOTE — Telephone Encounter (Signed)
Spoke with pt. She was so short of breath and wheezing that she could not even form a sentence. Advised pt that she needed to call 911 to be transported to the hospital. She agreed and the call was ended. Will keep a look out in her chart to make sure she made it to the hospital.

## 2019-11-11 NOTE — ED Notes (Signed)
Patient decided to leave and go to her doctor

## 2019-11-11 NOTE — ED Triage Notes (Signed)
Patient arrives to ED with complaints of shortness of breath x2 days. Pt states recent of dx COPD. SpO2 93% on room air. Denies sick contacts. Denies fevers, chills, CP. Continues to smoke cigarettes.

## 2019-11-11 NOTE — Patient Instructions (Signed)
Bronchitis  Severe shortness of breath  Wheezing  We will call you in a course of antibiotics Call you in a course of steroids Continue with Mucinex/any other over-the-counter cough medicine to help  Delsym-over-the-counter may help suppress the cough at night if you are not sleeping well  I will see you in about 3 months  Call if symptoms not improving  Continue to work on smoking cessation

## 2019-11-11 NOTE — Progress Notes (Signed)
Lauren Chapman    [id number]    [birth year]  [facility], Lilia Argue, MD (Inactive)  Referring Physician: No referring provider defined for this encounter.  Chief complaint:   Shortness of breath, wheezing  HPI: Recent exacerbation of symptoms Cough, wheezing, shortness of breath Exposed to family member with lower respiratory tract infection   Denies any fevers or chills Not sleeping well at night because of persistent wheezing and chest tightness  Still smoking actively  Has no history of asthma, no history of obstructive lung disease Concern was for emphysema with large lung volumes on chest x-ray  Quit smoking for about 2 years in the past, resume smoking related to stress  Denies any chest pains or chest discomfort Denies any hemoptysis No weight loss No fevers or chills Persistent cough with shortness of breath and wheezing  Recent chest x-ray reviewed  She does have a history of bipolar disorder Chronic migraines GERD  Of concern today is that her weight did increase by almost 30 pounds compared to the last time she was here  Outpatient Encounter Medications as of 11/11/2019  Medication Sig  . albuterol (PROVENTIL HFA;VENTOLIN HFA) 108 (90 Base) MCG/ACT inhaler Inhale 2 puffs into the lungs every 4 (four) hours as needed for wheezing or shortness of breath.  Marland Kitchen aspirin-acetaminophen-caffeine (EXCEDRIN MIGRAINE) 250-250-65 MG tablet Take 2 tablets by mouth every 6 (six) hours as needed for headache.  . clonazePAM (KLONOPIN) 0.5 MG tablet Take 0.5 mg by mouth 3 (three) times daily.   Marland Kitchen esomeprazole (NEXIUM) 20 MG capsule Take 1 capsule (20 mg total) by mouth 2 (two) times daily before a meal. (Patient taking differently: Take 20 mg by mouth 2 (two) times daily as needed (indigestion). )  . gabapentin (NEURONTIN) 800 MG tablet Take 800 mg by mouth 3 (three) times daily.   . Galcanezumab-gnlm (EMGALITY) 120 MG/ML SOAJ Inject 120 mg into  the skin every 30 (thirty) days.  . Glucosamine HCl (GLUCOSAMINE PO) Take 1 tablet by mouth daily as needed (arthritis).  Marland Kitchen lamoTRIgine (LAMICTAL) 150 MG tablet Take 150 mg by mouth 2 (two) times daily.   Marland Kitchen lidocaine (LIDODERM) 5 % Place 1 patch onto the skin daily. Remove & Discard patch within 12 hours or as directed by MD  . mirtazapine (REMERON) 15 MG tablet Take 15 mg by mouth at bedtime.  . nicotine (NICODERM CQ - DOSED IN MG/24 HOURS) 14 mg/24hr patch Place 1 patch (14 mg total) onto the skin daily.  . ondansetron (ZOFRAN) 4 MG tablet Take 1 tablet (4 mg total) by mouth every 8 (eight) hours as needed for nausea or vomiting.  . rizatriptan (MAXALT) 10 MG tablet Take 1 tab at onset of migraine.  May repeat in 2 hrs, if needed.  Max dose: 2 tabs/day. This is a 30 day prescription. (Patient taking differently: Take 10 mg by mouth as needed for migraine. Take 1 tab at onset of migraine.  May repeat in 2 hrs, if needed.  Max dose: 2 tabs/day. This is a 30 day prescription.)  . sertraline (ZOLOFT) 100 MG tablet Take 300 mg by mouth daily.   Marland Kitchen tiZANidine (ZANAFLEX) 4 MG tablet Take 1 tablet (4 mg total) by mouth every 6 (six) hours as needed for muscle spasms. TAKE 1 TABLET BY MOUTH EVERY 6 HOURS AS NEEDED FOR MUSCLE SPASMS.  Marland Kitchen VRAYLAR 6 MG CAPS Take 6 mg by mouth daily.   . [DISCONTINUED] fluticasone (FLONASE)  50 MCG/ACT nasal spray Place 2 sprays into both nostrils daily. (Patient not taking: Reported on 09/26/2018)   No facility-administered encounter medications on file as of 11/11/2019.    Allergies as of 11/11/2019 - Review Complete 11/11/2019  Allergen Reaction Noted  . Codeine Anaphylaxis 10/11/2011    Past Medical History:  Diagnosis Date  . Anxiety   . Bipolar affective psychosis ([place])   . Complication of anesthesia    " Acting silly- waving at everyone"-Giddy  . Depression   . Emphysema lung ([facility])    inhaler prn,  followed by pcp and pulmonologist-- dr Margorie John  . Family  history of adverse reaction to anesthesia    Father had rheumatic fever, and died on operating table.  Marland Kitchen GERD (gastroesophageal reflux disease)   . Migraine   . Smokers' cough ([place])    per pt not productive  . SUI (stress urinary incontinence, female)   . Wears glasses     Past Surgical History:  Procedure Laterality Date  . CARPAL TUNNEL RELEASE Right 09-12-2007   @[email or web address]    and GANGLION CYST EXCISION  . CYSTOSCOPY N/A 04/29/2019   Procedure: CYSTOSCOPY;  Surgeon: Joseph Pierini, MD;  Location: [facility];  Service: Gynecology;  Laterality: N/A;  . DILATATION & CURETTAGE/HYSTEROSCOPY WITH MYOSURE N/A 11/13/2018   Procedure: [place];  Surgeon: Anastasio Auerbach, MD;  Location: [place];  Service: Gynecology;  Laterality: N/A;  request 9:00am OR start time in [place] [facility] block requests one hour  . DILATION AND CURETTAGE OF UTERUS  02/2019  . GANGLION CYST EXCISION     R wrist  . KNEE ARTHROSCOPY WITH ANTERIOR CRUCIATE LIGAMENT (ACL) REPAIR Right 10/2015  . LAPAROSCOPIC CHOLECYSTECTOMY  1997  . LAPAROSCOPIC HYSTERECTOMY Bilateral 04/29/2019   Procedure: HYSTERECTOMY TOTAL LAPAROSCOPIC, BILATERAL SALPINO-OOPHORECTOMY;  Surgeon: Joseph Pierini, MD;  Location: [facility];  Service: Gynecology;  Laterality: Bilateral;  . TOOTH EXTRACTION  04/01/2019    Family History  Problem Relation Age of Onset  . Depression Mother   . Cancer Mother        Cervical  . Alcohol abuse Father   . Crohn's disease Sister   . Stroke Brother 6  . Migraines Daughter   . GER disease Daughter   . Depression Daughter   . Migraines Daughter   . GER disease Daughter     Social History   Socioeconomic History  . Marital status: Divorced    Spouse name: n/a  . Number of children: 2  . Years of education: 28  . Highest education level: Not on file  Occupational History  . Occupation: Disabled    Comment:  Formerly a Pharmacist, hospital.  Tobacco Use  . Smoking status: Current Every Day Smoker    Packs/day: 0.50    Years: 33.00    Pack years: 16.50    Types: Cigarettes    Start date: 10/10/1981  . Smokeless tobacco: Never Used  . Tobacco comment: 04/03/19   per pt down to 4-5 cig per day  Vaping Use  . Vaping Use: Never used  Substance and Sexual Activity  . Alcohol use: Never    Alcohol/week: 0.0 standard drinks  . Drug use: Never  . Sexual activity: Not on file    Comment: 1st intercourse 49 yo-More than 5 partners  Other Topics Concern  . Not on file  Social History Narrative   Lives at home with 1 of her two daughters   Right-handed.  2-4 cups caffeine daily.   Disabled    Social Determinants of Health   Financial Resource Strain:   . Difficulty of Paying Living Expenses: Not on file  Food Insecurity:   . Worried About Charity fundraiser in the Last Year: Not on file  . Ran Out of Food in the Last Year: Not on file  Transportation Needs:   . Lack of Transportation (Medical): Not on file  . Lack of Transportation (Non-Medical): Not on file  Physical Activity:   . Days of Exercise per Week: Not on file  . Minutes of Exercise per Session: Not on file  Stress:   . Feeling of Stress : Not on file  Social Connections:   . Frequency of Communication with Friends and Family: Not on file  . Frequency of Social Gatherings with Friends and Family: Not on file  . Attends Religious Services: Not on file  . Active Member of Clubs or Organizations: Not on file  . Attends Archivist Meetings: Not on file  . Marital Status: Not on file  Intimate Partner Violence:   . Fear of Current or Ex-Partner: Not on file  . Emotionally Abused: Not on file  . Physically Abused: Not on file  . Sexually Abused: Not on file    Review of Systems  Constitutional: Negative for appetite change, fever and unexpected weight change.  HENT: Negative.   Eyes: Negative.   Respiratory: Positive for  cough, shortness of breath and wheezing.   Cardiovascular: Negative.   Gastrointestinal: Negative.   Endocrine: Negative.     Vitals:   11/11/19 1634  BP: 124/76  Pulse: 86  Temp: 98.2 F (36.8 C)  SpO2: 91%     Physical Exam Constitutional:      Appearance: She is well-developed.  HENT:     Head: Normocephalic.  Eyes:     General:        Right eye: No discharge.        Left eye: No discharge.  Neck:     Thyroid: No thyromegaly.  Cardiovascular:     Rate and Rhythm: Normal rate.  Pulmonary:     Effort: Pulmonary effort is normal. No respiratory distress.     Breath sounds: No stridor. Wheezing, rhonchi and rales present.  Musculoskeletal:     Cervical back: No rigidity or tenderness.  Psychiatric:        Mood and Affect: Mood normal.    Data Reviewed: Recent chest x-ray reviewed showing no acute infiltrate, hyperinflated lung volumes, flattened diaphragms PFT shows no obstruction, no significant bronchodilator response, normal TLC, normal diffusing capacity CT scan of the chest in January-within normal limits  Assessment:   Acute bronchitis -Course of Augmentin for 7 days -Course of prednisone for 7 days -Continue bronchodilators  Possible obstructive lung disease -No prior history of asthma -Shortness of breath wheezing-only uses albuterol as needed -This is been confirmed via PFT not showing any significant obstructive disease  Active smoker -Smoking cessation encouraged  Plan/Recommendations:  .  Bronchodilators  .  Steroids and course of antibiotics  .  Mucolytic  .  Delsym may help  Follow-up in 3 months Sherrilyn Rist MD  Pulmonary and Critical Care 11/11/2019, 4:36 PM  CC: No ref. provider found

## 2019-11-11 NOTE — Progress Notes (Signed)
Pt states the injection only helped L knee pain for 3-4 days. It was discussed if it didn't help to call and request an MRI.   MRI left knee- rule out meniscus tear.

## 2019-11-29 ENCOUNTER — Other Ambulatory Visit: Payer: Self-pay

## 2019-11-29 ENCOUNTER — Ambulatory Visit
Admit: 2019-11-29 | Discharge: 2019-11-29 | Disposition: A | Discharge status: Home / Self Care | Payer: Medicare Other | Attending: Sports Medicine | Admitting: Sports Medicine

## 2019-11-29 DIAGNOSIS — M25462 Effusion, left knee: Secondary | ICD-10-CM | POA: Diagnosis not present

## 2019-11-29 DIAGNOSIS — R200 Anesthesia of skin: Secondary | ICD-10-CM | POA: Diagnosis not present

## 2019-11-29 DIAGNOSIS — G8929 Other chronic pain: Secondary | ICD-10-CM

## 2019-12-01 ENCOUNTER — Encounter: Payer: Self-pay | Admitting: Family Medicine

## 2019-12-01 ENCOUNTER — Other Ambulatory Visit: Payer: Self-pay

## 2019-12-01 ENCOUNTER — Ambulatory Visit (INDEPENDENT_AMBULATORY_CARE_PROVIDER_SITE_OTHER): Payer: Medicare Other | Admitting: Family Medicine

## 2019-12-01 VITALS — BP 111/77 | HR 76 | Temp 98.0°F | Ht 62.0 in | Wt 182.0 lb

## 2019-12-01 DIAGNOSIS — Z23 Encounter for immunization: Secondary | ICD-10-CM

## 2019-12-01 DIAGNOSIS — F17211 Nicotine dependence, cigarettes, in remission: Secondary | ICD-10-CM | POA: Diagnosis not present

## 2019-12-01 DIAGNOSIS — K219 Gastro-esophageal reflux disease without esophagitis: Secondary | ICD-10-CM

## 2019-12-01 NOTE — Patient Instructions (Addendum)
See info below on cravings. 1800-quit-now may also be helpful.   Coping with Quitting Smoking  Quitting smoking is a physical and mental challenge. You will face cravings, withdrawal symptoms, and temptation. Before quitting, work with your health care provider to make a plan that can help you cope. Preparation can help you quit and keep you from giving in. How can I cope with cravings? Cravings usually last for 5-10 minutes. If you get through it, the craving will pass. Consider taking the following actions to help you cope with cravings:  Keep your mouth busy: ? Chew sugar-free gum. ? Suck on hard candies or a straw. ? Brush your teeth.  Keep your hands and body busy: ? Immediately change to a different activity when you feel a craving. ? Squeeze or play with a ball. ? Do an activity or a hobby, like making bead jewelry, practicing needlepoint, or working with wood. ? Mix up your normal routine. ? Take a short exercise break. Go for a quick walk or run up and down stairs. ? Spend time in public places where smoking is not allowed.  Focus on doing something kind or helpful for someone else.  Call a friend or family member to talk during a craving.  Join a support group.  Call a quit line, such as 1-800-QUIT-NOW.  Talk with your health care provider about medicines that might help you cope with cravings and make quitting easier for you. How can I deal with withdrawal symptoms? Your body may experience negative effects as it tries to get used to not having nicotine in the system. These effects are called withdrawal symptoms. They may include:  Feeling hungrier than normal.  Trouble concentrating.  Irritability.  Trouble sleeping.  Feeling depressed.  Restlessness and agitation.  Craving a cigarette. To manage withdrawal symptoms:  Avoid places, people, and activities that trigger your cravings.  Remember why you want to quit.  Get plenty of sleep.  Avoid coffee  and other caffeinated drinks. These may worsen some of your symptoms. How can I handle social situations? Social situations can be difficult when you are quitting smoking, especially in the first few weeks. To manage this, you can:  Avoid parties, bars, and other social situations where people might be smoking.  Avoid alcohol.  Leave right away if you have the urge to smoke.  Explain to your family and friends that you are quitting smoking. Ask for understanding and support.  Plan activities with friends or family where smoking is not an option. What are some ways I can cope with stress? Wanting to smoke may cause stress, and stress can make you want to smoke. Find ways to manage your stress. Relaxation techniques can help. For example:  Breathe slowly and deeply, in through your nose and out through your mouth.  Listen to soothing, relaxing music.  Talk with a family member or friend about your stress.  Light a candle.  Soak in a bath or take a shower.  Think about a peaceful place. What are some ways I can prevent weight gain? Be aware that many people gain weight after they quit smoking. However, not everyone does. To keep from gaining weight, have a plan in place before you quit and stick to the plan after you quit. Your plan should include:  Having healthy snacks. When you have a craving, it may help to: ? Eat plain popcorn, crunchy carrots, celery, or other cut vegetables. ? Chew sugar-free gum.  Changing how you eat: ?  Eat small portion sizes at meals. ? Eat 4-6 small meals throughout the day instead of 1-2 large meals a day. ? Be mindful when you eat. Do not watch television or do other things that might distract you as you eat.  Exercising regularly: ? Make time to exercise each day. If you do not have time for a long workout, do short bouts of exercise for 5-10 minutes several times a day. ? Do some form of strengthening exercise, like weight lifting, and some form  of aerobic exercise, like running or swimming.  Drinking plenty of water or other low-calorie or no-calorie drinks. Drink 6-8 glasses of water daily, or as much as instructed by your health care provider. Summary  Quitting smoking is a physical and mental challenge. You will face cravings, withdrawal symptoms, and temptation to smoke again. Preparation can help you as you go through these challenges.  You can cope with cravings by keeping your mouth busy (such as by chewing gum), keeping your body and hands busy, and making calls to family, friends, or a helpline for people who want to quit smoking.  You can cope with withdrawal symptoms by avoiding places where people smoke, avoiding drinks with caffeine, and getting plenty of rest.  Ask your health care provider about the different ways to prevent weight gain, avoid stress, and handle social situations. This information is not intended to replace advice given to you by your health care provider. Make sure you discuss any questions you have with your health care provider. Document Revised: 12/22/2016 Document Reviewed: 01/07/2016 Elsevier Patient Education  [employer].   If you have lab work done today you will be contacted with your lab results within the next 2 weeks.  If you have not heard from [place] then please contact us. The fastest way to get your results is to register for My Chart.   IF you received an x-ray today, you will receive an invoice from [facility]. Please contact [facility] at [phone] with questions or concerns regarding your invoice.   IF you received labwork today, you will receive an invoice from [place]. Please contact [facility] at [phone] with questions or concerns regarding your invoice.   Our billing staff will not be able to assist you with questions regarding bills from these companies.  You will be contacted with the lab results as soon as they are available. The fastest way  to get your results is to activate your My Chart account. Instructions are located on the last page of this paperwork. If you have not heard from [place] regarding the results in 2 weeks, please contact this office.

## 2019-12-01 NOTE — Progress Notes (Signed)
Subjective:  Patient ID: Lauren Chapman, female    DOB: [birth year]  Age: 49 y.o. MRN: [id number]  CC:  Chief Complaint  Patient presents with   Establish Care    PT states she is feeling ok. pt was recently put on antibiotics by her polminologyst and she just finished those. pulonary is following her breathing issue. Pt reports she just had an MRI on saturday on her L knee, but sports medicine is following the knee issue.   Follow-up    on quiting smoking. Pt reports not smoking at all in the past 3 weeks as of today.     HPI Lauren Chapman presents for establish care.  Previous provider Dr. Pamella Pert  Left knee pain, left hip pain  followed by sports medicine, MRI performed recently.  Femoral order, ITB stretches discussed with Dr. Pamella Pert in August. 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  Nicotine addiction August 24 visit, 6 to 9 cigarettes/day.  Had previously tried Chantix and Wellbutrin but made her mood worse.  NicoDerm CQ patches given last visit. Quit smoking 3 weeks ago with use of patch.  Still having cravings. Grind teeth.   Dyspnea Followed by pulmonary, Dr. Ander Slade for shortness of breath.  Appointment October 19.  Wheezing or rales and rhonchi were present at that time.  Recent x-ray without apparent acute infiltrate but did have hyperinflated lung volumes with flattened diaphragms, PFTs without obstruction, no significant bronchodilator response, normal TSH and normal diffusion capacity.  CT chest back in January was normal.  Was diagnosed with bronchitis, treated with prednisone and Augmentin for 7 days along with bronchodilators.  Mucolytic and Delsym were discussed.  46-month follow-up Felt better after a few days of meds above. Last used albuterol few days ago - only every few days now.  Migraine HA: Followed by neuro - Dr. Krista Blue. emgality every 30 days. Zoloft, tizanidine, malalt  for flairs.  Migraine HA 3-4 times per month - much better on Emgality.   Bipolar D/o, schizoaffective d/o.  Dr. Reece Levy, counseling - Jhonnie Garner.  Gabapentin 800mg  tid, klonopin 0.5mg  tid, lamictal 150mg  BID, remeron 15mg  qhs, zoloft 300mg  qd vraylar 6mg  qd. Same regimen for years overall.   GERD: nexium 77m qd - helps to manage sx's - controlled on QD dosing. OTC.   flonase as needed only for congestion.     HM: Hysterectomy in April.  Cataract surgery in July/August.   History Patient Active Problem List   Diagnosis Date Noted   History of postmenopausal bleeding 04/29/2019   Polypharmacy 04/03/2019   Emphysema, unspecified ([place]) 05/17/2018   Weakness 01/25/2017   Chronic migraine 12/22/2014   Encounter for other general counseling or advice on contraception 09/02/2014   Bipolar disorder ([place]) 10/12/2011   Anxiety and depression 10/12/2011   GERD (gastroesophageal reflux disease) 10/12/2011   Past Medical History:  Diagnosis Date   Anxiety    Bipolar affective psychosis ([place])    Complication of anesthesia    " Acting silly- waving at everyone"-Giddy   Depression    Emphysema lung ([place])    inhaler prn,  followed by pcp and pulmonologist-- dr Margorie John   Family history of adverse reaction to anesthesia    Father had rheumatic fever, and died on operating table.   GERD (gastroesophageal reflux disease)    Migraine    Smokers' cough ([place])    per pt not productive   SUI (stress urinary incontinence, female)  Wears glasses    Past Surgical History:  Procedure Laterality Date   ABDOMINAL HYSTERECTOMY N/A    Phreesia 11/30/2019   CARPAL TUNNEL RELEASE Right 09-12-2007   @[facility]    and GANGLION CYST EXCISION   CYSTOSCOPY N/A 04/29/2019   Procedure: CYSTOSCOPY;  Surgeon: Joseph Pierini, MD;  Location: [facility];  Service: Gynecology;  Laterality: N/A;   DILATATION & CURETTAGE/HYSTEROSCOPY WITH MYOSURE N/A 11/13/2018   Procedure:  DILATATION & CURETTAGE/HYSTEROSCOPY WITH MYOSURE;  Surgeon: Anastasio Auerbach, MD;  Location: [place];  Service: Gynecology;  Laterality: N/A;  request 9:00am OR start time in [place] block requests one hour   [facility]  02/2019   EYE SURGERY N/A    Phreesia 11/30/2019   GANGLION CYST EXCISION     R wrist   KNEE ARTHROSCOPY WITH ANTERIOR CRUCIATE LIGAMENT (ACL) REPAIR Right 10/2015   LAPAROSCOPIC CHOLECYSTECTOMY  1997   LAPAROSCOPIC HYSTERECTOMY Bilateral 04/29/2019   Procedure: HYSTERECTOMY TOTAL LAPAROSCOPIC, BILATERAL SALPINO-OOPHORECTOMY;  Surgeon: Joseph Pierini, MD;  Location: [facility];  Service: Gynecology;  Laterality: Bilateral;   TOOTH EXTRACTION  04/01/2019   Allergies  Allergen Reactions   Codeine Anaphylaxis    Anything with codeine   Prior to Admission medications   Medication Sig Start Date End Date Taking? Authorizing Provider  albuterol (PROVENTIL HFA;VENTOLIN HFA) 108 (90 Base) MCG/ACT inhaler Inhale 2 puffs into the lungs every 4 (four) hours as needed for wheezing or shortness of breath. 01/30/18  Yes Olalere, Adewale A, MD  amoxicillin-clavulanate (AUGMENTIN) 875-125 MG tablet Take 1 tablet by mouth 2 (two) times daily. 11/11/19  Yes Olalere, Ernesto Rutherford, MD  aspirin-acetaminophen-caffeine (EXCEDRIN MIGRAINE) 250-250-65 MG tablet Take 2 tablets by mouth every 6 (six) hours as needed for headache.   Yes [provider]  clonazePAM (KLONOPIN) 0.5 MG tablet Take 0.5 mg by mouth 3 (three) times daily.  12/04/17  Yes [provider]  esomeprazole (NEXIUM) 20 MG capsule Take 1 capsule (20 mg total) by mouth 2 (two) times daily before a meal. Patient taking differently: Take 20 mg by mouth 2 (two) times daily as needed (indigestion).  03/21/19  Yes Rutherford Guys, MD  gabapentin (NEURONTIN) 800 MG tablet Take 800 mg by mouth 3 (three) times daily.  07/02/18  Yes [provider]    Galcanezumab-gnlm (EMGALITY) 120 MG/ML SOAJ Inject 120 mg into the skin every 30 (thirty) days. 07/23/19  Yes Suzzanne Cloud, NP  Glucosamine HCl (GLUCOSAMINE PO) Take 1 tablet by mouth daily as needed (arthritis).   Yes [provider]  lamoTRIgine (LAMICTAL) 150 MG tablet Take 150 mg by mouth 2 (two) times daily.    Yes [provider]  lidocaine (LIDODERM) 5 % Place 1 patch onto the skin daily. Remove & Discard patch within 12 hours or as directed by MD 07/31/19  Yes Milus Banister C, DO  mirtazapine (REMERON) 15 MG tablet Take 15 mg by mouth at bedtime.   Yes [provider]  nicotine (NICODERM CQ - DOSED IN MG/24 HOURS) 14 mg/24hr patch Place 1 patch (14 mg total) onto the skin daily. 09/16/19  Yes Rutherford Guys, MD  ondansetron (ZOFRAN) 4 MG tablet Take 1 tablet (4 mg total) by mouth every 8 (eight) hours as needed for nausea or vomiting. 04/03/19  Yes Marcial Pacas, MD  predniSONE (DELTASONE) 20 MG tablet Take 1 tablet (20 mg total) by mouth daily with breakfast. 11/11/19  Yes Olalere, Adewale  A, MD  rizatriptan (MAXALT) 10 MG tablet Take 1 tab at onset of migraine.  May repeat in 2 hrs, if needed.  Max dose: 2 tabs/day. This is a 30 day prescription. Patient taking differently: Take 10 mg by mouth as needed for migraine. Take 1 tab at onset of migraine.  May repeat in 2 hrs, if needed.  Max dose: 2 tabs/day. This is a 30 day prescription. 07/03/19  Yes Suzzanne Cloud, NP  sertraline (ZOLOFT) 100 MG tablet Take 300 mg by mouth daily.    Yes [provider]  tiZANidine (ZANAFLEX) 4 MG tablet Take 1 tablet (4 mg total) by mouth every 6 (six) hours as needed for muscle spasms. TAKE 1 TABLET BY MOUTH EVERY 6 HOURS AS NEEDED FOR MUSCLE SPASMS. 04/03/19  Yes Marcial Pacas, MD  VRAYLAR 6 MG CAPS Take 6 mg by mouth daily.  06/17/19  Yes [provider]  fluticasone (FLONASE) 50 MCG/ACT nasal spray Place 2 sprays into both nostrils daily. Patient not taking: Reported  on 09/26/2018 09/24/18 10/22/18  Rutherford Guys, MD   Social History   Socioeconomic History   Marital status: Divorced    Spouse name: n/a   Number of children: 2   Years of education: 2   Highest education level: Not on file  Occupational History   Occupation: Disabled    Comment: Formerly a Pharmacist, hospital.  Tobacco Use   Smoking status: Former Smoker    Packs/day: 0.50    Years: 33.00    Pack years: 16.50    Types: Cigarettes    Start date: 10/10/1981    Quit date: 11/10/2019    Years since quitting: 0.0   Smokeless tobacco: Never Used   Tobacco comment: 04/03/19   per pt down to 4-5 cig per day  Vaping Use   Vaping Use: Never used  Substance and Sexual Activity   Alcohol use: Never    Alcohol/week: 0.0 standard drinks   Drug use: Never   Sexual activity: Not on file    Comment: 1st intercourse 49 yo-More than 5 partners  Other Topics Concern   Not on file  Social History Narrative   Lives at home with 1 of her two daughters   Right-handed.   2-4 cups caffeine daily.   Disabled    Social Determinants of Health   Financial Resource Strain:    Difficulty of Paying Living Expenses: Not on file  Food Insecurity:    Worried About Charity fundraiser in the Last Year: Not on file   [employer] of Food in the Last Year: Not on file  Transportation Needs:    Lack of Transportation (Medical): Not on file   Lack of Transportation (Non-Medical): Not on file  Physical Activity:    Days of Exercise per Week: Not on file   Minutes of Exercise per Session: Not on file  Stress:    Feeling of Stress : Not on file  Social Connections:    Frequency of Communication with Friends and Family: Not on file   Frequency of Social Gatherings with Friends and Family: Not on file   Attends Religious Services: Not on file   Active Member of Clubs or Organizations: Not on file   Attends Archivist Meetings: Not on file   Marital Status: Not on file  Intimate  Partner Violence:    Fear of Current or Ex-Partner: Not on file   Emotionally Abused: Not on file   Physically Abused: Not  on file   Sexually Abused: Not on file    Review of Systems per HPI.   Objective:   Vitals:   12/01/19 1345  BP: 111/77  Pulse: 76  Temp: 98 F (36.7 C)  TempSrc: Temporal  SpO2: 96%  Weight: 182 lb (82.6 kg)  Height: 5\' 2"  (1.575 m)   Physical Exam Vitals reviewed.  Constitutional:      Appearance: She is well-developed.  HENT:     Head: Normocephalic and atraumatic.  Eyes:     Conjunctiva/sclera: Conjunctivae normal.     Pupils: Pupils are equal, round, and reactive to light.  Neck:     Vascular: No carotid bruit.  Cardiovascular:     Rate and Rhythm: Normal rate and regular rhythm.     Heart sounds: Normal heart sounds.  Pulmonary:     Effort: Pulmonary effort is normal. No respiratory distress.     Breath sounds: Normal breath sounds. No wheezing, rhonchi or rales.  Abdominal:     Palpations: Abdomen is soft. There is no pulsatile mass.     Tenderness: There is no abdominal tenderness.  Skin:    General: Skin is warm and dry.  Neurological:     Mental Status: She is alert and oriented to person, place, and time.  Psychiatric:        Mood and Affect: Mood normal.        Behavior: Behavior normal.        Assessment & Plan:  Lauren Chapman is a 49 y.o. female . Cigarette nicotine dependence in remission  - commended on cessation. Handout given for info on cravings.   Need for vaccination - Plan: Flu Vaccine QUAD 36+ mos IM  Gastroesophageal reflux disease, unspecified whether esophagitis present  - stable with daily nexium dosing, OTC.   No orders of the defined types were placed in this encounter.  Patient Instructions    See info below on cravings. 1800-quit-now may also be helpful.   Coping with Quitting Smoking  Quitting smoking is a physical and mental challenge. You will face cravings, withdrawal symptoms, and  temptation. Before quitting, work with your health care provider to make a plan that can help you cope. Preparation can help you quit and keep you from giving in. How can I cope with cravings? Cravings usually last for 5-10 minutes. If you get through it, the craving will pass. Consider taking the following actions to help you cope with cravings:  Keep your mouth busy: ? Chew sugar-free gum. ? Suck on hard candies or a straw. ? Brush your teeth.  Keep your hands and body busy: ? Immediately change to a different activity when you feel a craving. ? Squeeze or play with a ball. ? Do an activity or a hobby, like making bead jewelry, practicing needlepoint, or working with wood. ? Mix up your normal routine. ? Take a short exercise break. Go for a quick walk or run up and down stairs. ? Spend time in public places where smoking is not allowed.  Focus on doing something kind or helpful for someone else.  Call a friend or family member to talk during a craving.  Join a support group.  Call a quit line, such as 1-800-QUIT-NOW.  Talk with your health care provider about medicines that might help you cope with cravings and make quitting easier for you. How can I deal with withdrawal symptoms? Your body may experience negative effects as it tries to get used to not  having nicotine in the system. These effects are called withdrawal symptoms. They may include:  Feeling hungrier than normal.  Trouble concentrating.  Irritability.  Trouble sleeping.  Feeling depressed.  Restlessness and agitation.  Craving a cigarette. To manage withdrawal symptoms:  Avoid places, people, and activities that trigger your cravings.  Remember why you want to quit.  Get plenty of sleep.  Avoid coffee and other caffeinated drinks. These may worsen some of your symptoms. How can I handle social situations? Social situations can be difficult when you are quitting smoking, especially in the first few  weeks. To manage this, you can:  Avoid parties, bars, and other social situations where people might be smoking.  Avoid alcohol.  Leave right away if you have the urge to smoke.  Explain to your family and friends that you are quitting smoking. Ask for understanding and support.  Plan activities with friends or family where smoking is not an option. What are some ways I can cope with stress? Wanting to smoke may cause stress, and stress can make you want to smoke. Find ways to manage your stress. Relaxation techniques can help. For example:  Breathe slowly and deeply, in through your nose and out through your mouth.  Listen to soothing, relaxing music.  Talk with a family member or friend about your stress.  Light a candle.  Soak in a bath or take a shower.  Think about a peaceful place. What are some ways I can prevent weight gain? Be aware that many people gain weight after they quit smoking. However, not everyone does. To keep from gaining weight, have a plan in place before you quit and stick to the plan after you quit. Your plan should include:  Having healthy snacks. When you have a craving, it may help to: ? Eat plain popcorn, crunchy carrots, celery, or other cut vegetables. ? Chew sugar-free gum.  Changing how you eat: ? Eat small portion sizes at meals. ? Eat 4-6 small meals throughout the day instead of 1-2 large meals a day. ? Be mindful when you eat. Do not watch television or do other things that might distract you as you eat.  Exercising regularly: ? Make time to exercise each day. If you do not have time for a long workout, do short bouts of exercise for 5-10 minutes several times a day. ? Do some form of strengthening exercise, like weight lifting, and some form of aerobic exercise, like running or swimming.  Drinking plenty of water or other low-calorie or no-calorie drinks. Drink 6-8 glasses of water daily, or as much as instructed by your health care  provider. Summary  Quitting smoking is a physical and mental challenge. You will face cravings, withdrawal symptoms, and temptation to smoke again. Preparation can help you as you go through these challenges.  You can cope with cravings by keeping your mouth busy (such as by chewing gum), keeping your body and hands busy, and making calls to family, friends, or a helpline for people who want to quit smoking.  You can cope with withdrawal symptoms by avoiding places where people smoke, avoiding drinks with caffeine, and getting plenty of rest.  Ask your health care provider about the different ways to prevent weight gain, avoid stress, and handle social situations. This information is not intended to replace advice given to you by your health care provider. Make sure you discuss any questions you have with your health care provider. Document Revised: 12/22/2016 Document Reviewed: 01/07/2016 Elsevier  Patient Education  [employer].   If you have lab work done today you will be contacted with your lab results within the next 2 weeks.  If you have not heard from [place] then please contact us. The fastest way to get your results is to register for My Chart.   IF you received an x-ray today, you will receive an invoice from [facility]. Please contact [facility] at [phone] with questions or concerns regarding your invoice.   IF you received labwork today, you will receive an invoice from [facility]. Please contact [facility] at [phone] with questions or concerns regarding your invoice.   Our billing staff will not be able to assist you with questions regarding bills from these companies.  You will be contacted with the lab results as soon as they are available. The fastest way to get your results is to activate your My Chart account. Instructions are located on the last page of this paperwork. If you have not heard from [place] regarding the results in 2 weeks, please contact  this office.         Signed, Merri Ray, MD [facility]

## 2019-12-03 ENCOUNTER — Other Ambulatory Visit: Payer: Self-pay

## 2019-12-03 DIAGNOSIS — G8929 Other chronic pain: Secondary | ICD-10-CM

## 2019-12-03 DIAGNOSIS — M25562 Pain in left knee: Secondary | ICD-10-CM

## 2019-12-03 NOTE — Progress Notes (Signed)
Called pt to let her know that the MRI of her knee is normal. No significant arthritis and nothing is torn. Nothing that needs surgery. Dr. Micheline Chapman would recommend a return to physical therapy, this time focusing on her knee. She is agreeable to that plan. She will do 4 weeks of PT and f/u with [place] if needed after that.

## 2020-01-05 ENCOUNTER — Encounter: Payer: Self-pay | Admitting: Neurology

## 2020-01-05 ENCOUNTER — Other Ambulatory Visit: Payer: Self-pay

## 2020-01-05 ENCOUNTER — Ambulatory Visit (INDEPENDENT_AMBULATORY_CARE_PROVIDER_SITE_OTHER): Payer: Medicare Other | Admitting: Neurology

## 2020-01-05 VITALS — BP 118/74 | HR 82 | Ht 62.0 in | Wt 186.2 lb

## 2020-01-05 DIAGNOSIS — G43709 Chronic migraine without aura, not intractable, without status migrainosus: Secondary | ICD-10-CM | POA: Diagnosis not present

## 2020-01-05 MED ORDER — KETOROLAC TROMETHAMINE 30 MG/ML IJ SOLN
30.0000 mg | Freq: Once | INTRAMUSCULAR | Status: AC
  Administered 2020-01-05: 14:00:00 30 mg via INTRAMUSCULAR

## 2020-01-05 MED ORDER — RIZATRIPTAN BENZOATE 10 MG PO TABS: ORAL_TABLET | ORAL | 6 refills | Status: DC

## 2020-01-05 MED ORDER — TIZANIDINE HCL 4 MG PO TABS: 4.0000 mg | ORAL_TABLET | Freq: Four times a day (QID) | ORAL | 6 refills | Status: DC | PRN

## 2020-01-05 MED ORDER — EMGALITY 120 MG/ML ~~LOC~~ SOAJ: 120.0000 mg | SUBCUTANEOUS | 11 refills | Status: DC

## 2020-01-05 MED ORDER — ONDANSETRON HCL 4 MG PO TABS: 4.0000 mg | ORAL_TABLET | Freq: Three times a day (TID) | ORAL | 6 refills | Status: AC | PRN

## 2020-01-05 NOTE — Addendum Note (Signed)
Addended by: Valerie Salts on: 01/05/2020 01:56 PM   Modules accepted: Orders

## 2020-01-05 NOTE — Progress Notes (Signed)
PATIENT: Lauren Chapman DOB: Oct 01, 1970  REASON FOR VISIT: follow up HISTORY FROM: patient  HISTORY OF PRESENT ILLNESS: Today 01/05/20  HISTORY  Lauren Chapman a 49 year old right-handed female, seen in refer by her primary care Bettey Costa, NP for evaluation of migraine in October 19 2014  I reviewed and summarized her office visit, reviewed laboratory in July 2016, mild elevated triglycerides 208,  She has a history of bipolar affective disorder, is on polypharmacy treatment, this including lamotrigine, Zoloft, Brintellix, Valium, Cogentin, gabapentin.  She started to have migraine since 49 years old, her typical migraine are lateralized severe pounding headache was associated light noise sensitivity, lasting 1-2 days, she often has alternating of sound, visual disturbance before the onset of migraine, and during severe headaches. over the years, she has had at least twice migraine each week, she has been taking frequent goody powders, which does not work anymore, now she take over-the-counter ibuprofen and Aleve, up to 10 tablets each day to try to get rid of headache with suboptimal response. She was giving a prescription of Imitrex upon why ER presentation for her migraines, which does not work well, she reported loose control of her bowel movements after taking Imitrex.   She was not able to identify trigger for her migraine, she does couple days each week because of her severe headaches, she has never tried any preventive medications in the past  UPDATE Dec 22 2014: She is now taking Inderal 60 mg twice a day, tolerating medication well, she did see mild to moderate improvement of her headache instead of twice week, she is now having her typical migraine once a week, she has left-sided retro-orbital area severe pounding headache with associated light noise sensitivity, nauseous, it can last for a few days, Maxalt works sometimes, but she has to repeat multiple times,  sometimes she go to hospital for injection, she has tried migraine cocktail years, Compazine, Benadryl, Toradol with limited help, sometimes she has to use Demerol about once a year for migraine  UPDATE Mar 08 2015: She did have Botox injection as migraine prevention by rehabilitation physician Dr. Posey Pronto February 04 2015, which has helped her headache, she has less severe, less frequent headaches, couple times each week, she is now receiving naproxen 1-2 times each week for mild to moderate headaches, use Imitrex injection for more severe headaches, which works well for her,  UPDATE Oct 02 2016: She is using imitrex prn once a month, for severe migraine headaches, which did help her headaches, continue Botox injection by rehabilitation physician Dr. Posey Pronto, she reported significant improvement with Botox injection, she is now on lower dose of propanolol 30 mg twice a day, higher dose cause decreased blood pressure  She reported taking Excedrin Migraine about every other day for mild to moderate headaches  UPDATE Jan 25 2017: Laboratory evaluation on January 19, 2017, acetylcholine receptor antibody was negative, normal TSH, CBC, CMP.  She is receiving BOTOX injection every 3 monthsfrom rehabphysician Dr. Marlin Canary recent injection was on November 08 2016, which hashelped her migraine  She still has migraine 1-2 a week, using maxalt, zofran, tizanidine prn, twice a week,   She has develeopeddroopy eyelidsince June 2018, to the point of covering her visionsometimes, she also complains of chewing difficulty, but denies double vision, no significant limb muscle weakness.  Update April 04, 2019: Lauren Chapman came back for follow-up after a long gap following last visit in January 2019, complains of worsening migraine headaches, she is still  on polypharmacy for her bipolar disorder, Zoloft 200 mg every day, Remeron 15 mg at bedtime, lamotrigine 150 mg twice a day, gabapentin 800 mg 3 times a  day, clonazepam 0.5 mg 3 times a day, Vraylar 4.5 mg at bedtime  She has used up her migraine preventive medications, Maxalt, Zofran, tizanidine, patient stated dose medicine works well for her migraine  She now complains of migraine on a daily basis, has been taking multiple dose of Excedrin Migraine over the past few months, today she came in with 8 out of 10 holoacranial headaches been ongoing for a week, try to hold medications without improvement, previous attempt for Aimovig as migraine premention was not approved by her insurance company.  Update July 03, 2019 SS: Her headaches have improved since last seen, was having 6-8 a month- after Ajovy, having 3-4 a month. In the last 3 months has only done 2 Ajovy injections, her pharmacy was out of the medicine.  She switched insurances, has some trouble getting Maxalt, is out of the medication.  With Maxalt, works well for acute headache when combined with Zofran and tizanidine.  Currently has headache today-typical pattern, is under more stress due to selling her home.  She remains on clonazepam, Lamictal, Remeron, Vraylar, and Zoloft.  Update January 05, 2020 SS: Here today for follow-up unaccompanied, is on [employer] prefers this) doing well, on average 2-3 migraines a month.  For acute migraine, will take tizanidine, Maxalt, Zofran with good benefit.  For the last 2 weeks, bandlike sensation around the head, under more stress, related to her daughter.  Continues to follow with psychiatry, is taking Vraylar, Klonopin, gabapentin, Lamictal, Zoloft  REVIEW OF SYSTEMS: Out of a complete 14 system review of symptoms, the patient complains only of the following symptoms, and all other reviewed systems are negative.  Headache   ALLERGIES: Allergies  Allergen Reactions  . Codeine Anaphylaxis    Anything with codeine    HOME MEDICATIONS: Outpatient Medications Prior to Visit  Medication Sig Dispense Refill  . albuterol (PROVENTIL  HFA;VENTOLIN HFA) 108 (90 Base) MCG/ACT inhaler Inhale 2 puffs into the lungs every 4 (four) hours as needed for wheezing or shortness of breath. 1 Inhaler 1  . aspirin-acetaminophen-caffeine (EXCEDRIN MIGRAINE) 250-250-65 MG tablet Take 2 tablets by mouth every 6 (six) hours as needed for headache.    . clonazePAM (KLONOPIN) 0.5 MG tablet Take 0.5 mg by mouth 3 (three) times daily.   2  . esomeprazole (NEXIUM) 20 MG capsule Take 1 capsule (20 mg total) by mouth 2 (two) times daily before a meal. (Patient taking differently: Take 20 mg by mouth 2 (two) times daily as needed (indigestion).) 60 capsule 1  . gabapentin (NEURONTIN) 800 MG tablet Take 800 mg by mouth 3 (three) times daily.     . Glucosamine HCl (GLUCOSAMINE PO) Take 1 tablet by mouth daily as needed (arthritis).    Marland Kitchen lamoTRIgine (LAMICTAL) 150 MG tablet Take 150 mg by mouth 2 (two) times daily.     . mirtazapine (REMERON) 15 MG tablet Take 15 mg by mouth at bedtime.    . nicotine (NICODERM CQ - DOSED IN MG/24 HOURS) 14 mg/24hr patch Place 1 patch (14 mg total) onto the skin daily. 28 patch 1  . sertraline (ZOLOFT) 100 MG tablet Take 300 mg by mouth daily.     Marland Kitchen VRAYLAR 6 MG CAPS Take 6 mg by mouth daily.     . Galcanezumab-gnlm (EMGALITY) 120 MG/ML SOAJ Inject 120 mg  into the skin every 30 (thirty) days. 1 pen 11  . ondansetron (ZOFRAN) 4 MG tablet Take 1 tablet (4 mg total) by mouth every 8 (eight) hours as needed for nausea or vomiting. 20 tablet 6  . rizatriptan (MAXALT) 10 MG tablet Take 1 tab at onset of migraine.  May repeat in 2 hrs, if needed.  Max dose: 2 tabs/day. This is a 30 day prescription. (Patient taking differently: Take 10 mg by mouth as needed for migraine. Take 1 tab at onset of migraine.  May repeat in 2 hrs, if needed.  Max dose: 2 tabs/day. This is a 30 day prescription.) 12 tablet 6  . tiZANidine (ZANAFLEX) 4 MG tablet Take 1 tablet (4 mg total) by mouth every 6 (six) hours as needed for muscle spasms. TAKE 1 TABLET  BY MOUTH EVERY 6 HOURS AS NEEDED FOR MUSCLE SPASMS. 30 tablet 6  . amoxicillin-clavulanate (AUGMENTIN) 875-125 MG tablet Take 1 tablet by mouth 2 (two) times daily. 14 tablet 0  . lidocaine (LIDODERM) 5 % Place 1 patch onto the skin daily. Remove & Discard patch within 12 hours or as directed by MD 30 patch 0  . predniSONE (DELTASONE) 20 MG tablet Take 1 tablet (20 mg total) by mouth daily with breakfast. 14 tablet 0   No facility-administered medications prior to visit.    PAST MEDICAL HISTORY: Past Medical History:  Diagnosis Date  . Anxiety   . Bipolar affective psychosis ([place])   . Complication of anesthesia    " Acting silly- waving at everyone"-Giddy  . Depression   . Emphysema lung ([facility])    inhaler prn,  followed by pcp and pulmonologist-- dr Margorie John  . Family history of adverse reaction to anesthesia    Father had rheumatic fever, and died on operating table.  Marland Kitchen GERD (gastroesophageal reflux disease)   . Migraine   . Smokers' cough ([place])    per pt not productive  . SUI (stress urinary incontinence, female)   . Wears glasses     PAST SURGICAL HISTORY: Past Surgical History:  Procedure Laterality Date  . ABDOMINAL HYSTERECTOMY N/A    Phreesia 11/30/2019  . CARPAL TUNNEL RELEASE Right 09-12-2007   @[email or web address]    and GANGLION CYST EXCISION  . CYSTOSCOPY N/A 04/29/2019   Procedure: CYSTOSCOPY;  Surgeon: Joseph Pierini, MD;  Location: [facility];  Service: Gynecology;  Laterality: N/A;  . DILATATION & CURETTAGE/HYSTEROSCOPY WITH MYOSURE N/A 11/13/2018   Procedure: [place];  Surgeon: Anastasio Auerbach, MD;  Location: [facility];  Service: Gynecology;  Laterality: N/A;  request 9:00am OR start time in [place] [facility] block requests one hour  . DILATION AND CURETTAGE OF UTERUS  02/2019  . EYE SURGERY N/A    Phreesia 11/30/2019  . GANGLION CYST EXCISION     R wrist  . KNEE ARTHROSCOPY WITH ANTERIOR CRUCIATE  LIGAMENT (ACL) REPAIR Right 10/2015  . LAPAROSCOPIC CHOLECYSTECTOMY  1997  . LAPAROSCOPIC HYSTERECTOMY Bilateral 04/29/2019   Procedure: HYSTERECTOMY TOTAL LAPAROSCOPIC, BILATERAL SALPINO-OOPHORECTOMY;  Surgeon: Joseph Pierini, MD;  Location: [facility];  Service: Gynecology;  Laterality: Bilateral;  . TOOTH EXTRACTION  04/01/2019    FAMILY HISTORY: Family History  Problem Relation Age of Onset  . Depression Mother   . Cancer Mother        Cervical  . Alcohol abuse Father   . Crohn's disease Sister   . Stroke Brother 27  . Migraines Daughter   . GER disease  Daughter   . Depression Daughter   . Migraines Daughter   . GER disease Daughter     SOCIAL HISTORY: Social History   Socioeconomic History  . Marital status: Divorced    Spouse name: n/a  . Number of children: 2  . Years of education: 1  . Highest education level: Not on file  Occupational History  . Occupation: Disabled    Comment: Formerly a Pharmacist, hospital.  Tobacco Use  . Smoking status: Former Smoker    Packs/day: 0.50    Years: 33.00    Pack years: 16.50    Types: Cigarettes    Start date: 10/10/1981    Quit date: 11/10/2019    Years since quitting: 0.1  . Smokeless tobacco: Never Used  . Tobacco comment: 04/03/19   per pt down to 4-5 cig per day  Vaping Use  . Vaping Use: Never used  Substance and Sexual Activity  . Alcohol use: Never    Alcohol/week: 0.0 standard drinks  . Drug use: Never  . Sexual activity: Not on file    Comment: 1st intercourse 49 yo-More than 5 partners  Other Topics Concern  . Not on file  Social History Narrative   Lives at home with 1 of her two daughters   Right-handed.   2-4 cups caffeine daily.   Disabled    Social Determinants of Health   Financial Resource Strain: Not on file  Food Insecurity: Not on file  Transportation Needs: Not on file  Physical Activity: Not on file  Stress: Not on file  Social Connections: Not on file  Intimate Partner  Violence: Not on file   PHYSICAL EXAM  Vitals:   01/05/20 1312  BP: 118/74  Pulse: 82  Weight: 186 lb 3.2 oz (84.5 kg)  Height: 5\' 2"  (1.575 m)   Body mass index is 34.06 kg/m.  Generalized: Well developed, in no acute distress   Neurological examination  Mentation: Alert oriented to time, place, history taking. Follows all commands speech and language fluent Cranial nerve II-XII: Pupils were equal round reactive to light. Extraocular movements were full, visual field were full on confrontational test. Facial sensation and strength were normal. Head turning and shoulder shrug  were normal and symmetric. Motor: The motor testing reveals 5 over 5 strength of all 4 extremities. Good symmetric motor tone is noted throughout.  Sensory: Sensory testing is intact to soft touch on all 4 extremities. No evidence of extinction is noted.  Coordination: Cerebellar testing reveals good finger-nose-finger and heel-to-shin bilaterally.  Gait and station: Gait is normal.  Reflexes: Deep tendon reflexes are symmetric and normal bilaterally.   DIAGNOSTIC DATA (LABS, IMAGING, TESTING) - I reviewed patient records, labs, notes, testing and imaging myself where available.  Lab Results  Component Value Date   WBC 8.2 11/11/2019   HGB 13.3 11/11/2019   HCT 40.4 11/11/2019   MCV 91.6 11/11/2019   PLT 255 11/11/2019      Component Value Date/Time   NA 141 11/11/2019 0926   NA 143 03/24/2019 1510   K 4.4 11/11/2019 0926   CL 102 11/11/2019 0926   CO2 28 11/11/2019 0926   GLUCOSE 99 11/11/2019 0926   BUN 6 11/11/2019 0926   BUN 10 03/24/2019 1510   CREATININE 0.97 11/11/2019 0926   CREATININE 1.12 (H) 08/17/2015 1608   CALCIUM 9.4 11/11/2019 0926   PROT 6.9 03/24/2019 1510   ALBUMIN 4.4 03/24/2019 1510   AST 14 03/24/2019 1510   ALT 13 03/24/2019  1510   ALKPHOS 98 03/24/2019 1510   BILITOT <0.2 03/24/2019 1510   GFRNONAA >60 11/11/2019 0926   GFRAA 71 03/24/2019 1510   Lab Results   Component Value Date   CHOL 179 10/11/2011   HDL 50 10/11/2011   LDLCALC 103 (H) 10/11/2011   TRIG 130 10/11/2011   CHOLHDL 3.6 10/11/2011   No results found for: HGBA1C No results found for: VITAMINB12 Lab Results  Component Value Date   TSH 0.86 09/09/2018    ASSESSMENT AND PLAN 49 y.o. year old female  has a past medical history of Anxiety, Bipolar affective psychosis ([place]), Complication of anesthesia, Depression, Emphysema lung ([place]), Family history of adverse reaction to anesthesia, GERD (gastroesophageal reflux disease), Migraine, Smokers' cough ([place]), SUI (stress urinary incontinence, female), and Wears glasses. here with:  1.  Chronic migraine headache -Migraines currently well controlled, 2-3 a month -Continue Emgality 120 mg monthly injection for migraine prevention -Tension like headache last 2 weeks, stress related, will treat with 30 mg Toradol IM injection, go home, rest, sleep, drink plenty of water -Continue Maxalt, tizanidine, Zofran for acute migraine headache -Previously tried Botox, was helpful, but complained of bilateral ptosis, weakness following her last injection in October 2018, extensive evaluation showed negative acetylcholine receptor antibody-still felt Botox is not a good option for migraine prevention -MRI of the brain without contrast in January 2019 was normal -Follow-up in 6 months or sooner if needed   2.  Polypharmacy treatment -On Vraylar, Klonopin, gabapentin, Lamictal, Remeron, Zoloft  I spent 20 minutes of face-to-face and non-face-to-face time with patient.  This included previsit chart review, lab review, study review, order entry, electronic health record documentation, patient education.  Butler Denmark, AGNP-C, DNP 01/05/2020, 1:42 PM [facility] [address] [place], [place] [address] [phone]

## 2020-01-05 NOTE — Patient Instructions (Signed)
Continue current medications for migraine  Will give Toradol injection for headache  Go home try rest, sleep  See you back in 6 months

## 2020-01-22 ENCOUNTER — Encounter: Payer: Self-pay | Admitting: Family Medicine

## 2020-01-22 ENCOUNTER — Ambulatory Visit (INDEPENDENT_AMBULATORY_CARE_PROVIDER_SITE_OTHER): Payer: Medicare Other | Admitting: Family Medicine

## 2020-01-22 ENCOUNTER — Other Ambulatory Visit: Payer: Self-pay

## 2020-01-22 DIAGNOSIS — S025XXB Fracture of tooth (traumatic), initial encounter for open fracture: Secondary | ICD-10-CM

## 2020-01-22 DIAGNOSIS — K0889 Other specified disorders of teeth and supporting structures: Secondary | ICD-10-CM

## 2020-01-22 MED ORDER — HYDROCODONE-ACETAMINOPHEN 5-325 MG PO TABS: ORAL_TABLET | ORAL | 0 refills | Status: DC

## 2020-01-22 MED ORDER — PENICILLIN V POTASSIUM 500 MG PO TABS: 500.0000 mg | ORAL_TABLET | Freq: Three times a day (TID) | ORAL | 0 refills | Status: DC

## 2020-01-22 NOTE — Patient Instructions (Addendum)
  Use over-the-counter topical coding or pain relieving medicines from the pharmacy.  Take hydrocodone 5 mg (Norco) every 6 hours if needed for severe pain only.  This is a controlled substance not to be abused or given to anyone else.  The story if you have remaining medicines when the problem is taken care of.  See your dentist as soon as possible  If there develops redness around the base of the tooth or more tenderness around the base of the tooth go ahead and begin taking the penicillin 500 mg 1 3 times daily.  If you have lab work done today you will be contacted with your lab results within the next 2 weeks.  If you have not heard from [place] then please contact us. The fastest way to get your results is to register for My Chart.   IF you received an x-ray today, you will receive an invoice from [facility]. Please contact [facility] at [phone] with questions or concerns regarding your invoice.   IF you received labwork today, you will receive an invoice from [facility]. Please contact [facility] at [phone] with questions or concerns regarding your invoice.   Our billing staff will not be able to assist you with questions regarding bills from these companies.  You will be contacted with the lab results as soon as they are available. The fastest way to get your results is to activate your My Chart account. Instructions are located on the last page of this paperwork. If you have not heard from [place] regarding the results in 2 weeks, please contact this office.

## 2020-01-22 NOTE — Progress Notes (Signed)
Patient ID: Lauren Chapman, female    DOB: 1970/06/25  Age: 49 y.o. MRN: [id number]  Chief Complaint  Patient presents with  . Dental Injury    Pt reports tooth crumbling and is now infected, pt does not have an appointment with dentistry as hers is closed for the holiday and is requesting antibiotic and pain killer till she can be seen, states it is broken off into her gums and is highly sensitive, originally broke 2 days ago.     Subjective:   Patient has a broken left lower tooth that happened 2 days ago.  She cannot get in with a dentist on this holiday week.  Current allergies, medications, problem list, past/family and social histories reviewed.  Objective:  LMP 05/02/2019 Comment: irregular cycles  What appears to be about the #22 cuspid tooth on the bottom left is broken off.  No erythema around the base of it.  Assessment & Plan:   Assessment: 1. Pain, dental   2. Open fracture of tooth, initial encounter       Plan: See instructions.  She scores 270/800 on her drug use index and I do not believe she is an abuser.  No orders of the defined types were placed in this encounter.   No orders of the defined types were placed in this encounter.        Patient Instructions    Use over-the-counter topical coding or pain relieving medicines from the pharmacy.  Take hydrocodone 5 mg (Norco) every 6 hours if needed for severe pain only.  This is a controlled substance not to be abused or given to anyone else.  The story if you have remaining medicines when the problem is taken care of.  See your dentist as soon as possible  If there develops redness around the base of the tooth or more tenderness around the base of the tooth go ahead and begin taking the penicillin 500 mg 1 3 times daily.  If you have lab work done today you will be contacted with your lab results within the next 2 weeks.  If you have not heard from [place] then please contact us. The fastest way to get  your results is to register for My Chart.   IF you received an x-ray today, you will receive an invoice from [facility]. Please contact [facility] at [phone] with questions or concerns regarding your invoice.   IF you received labwork today, you will receive an invoice from [facility]. Please contact [facility] at [phone] with questions or concerns regarding your invoice.   Our billing staff will not be able to assist you with questions regarding bills from these companies.  You will be contacted with the lab results as soon as they are available. The fastest way to get your results is to activate your My Chart account. Instructions are located on the last page of this paperwork. If you have not heard from [place] regarding the results in 2 weeks, please contact this office.        Return if symptoms worsen or fail to improve.   Janace Hoard, MD 01/22/2020

## 2020-01-24 DIAGNOSIS — N281 Cyst of kidney, acquired: Secondary | ICD-10-CM

## 2020-01-24 HISTORY — PX: OTHER SURGICAL HISTORY: SHX169

## 2020-01-24 HISTORY — DX: Cyst of kidney, acquired: N28.1

## 2020-01-28 DIAGNOSIS — Z79891 Long term (current) use of opiate analgesic: Secondary | ICD-10-CM | POA: Diagnosis not present

## 2020-02-10 ENCOUNTER — Other Ambulatory Visit: Payer: Self-pay

## 2020-02-10 ENCOUNTER — Ambulatory Visit (INDEPENDENT_AMBULATORY_CARE_PROVIDER_SITE_OTHER): Payer: Medicare Other | Admitting: Registered Nurse

## 2020-02-10 ENCOUNTER — Ambulatory Visit (INDEPENDENT_AMBULATORY_CARE_PROVIDER_SITE_OTHER): Payer: Medicare Other

## 2020-02-10 ENCOUNTER — Encounter: Payer: Self-pay | Admitting: Registered Nurse

## 2020-02-10 ENCOUNTER — Ambulatory Visit: Payer: Self-pay | Admitting: Family Medicine

## 2020-02-10 DIAGNOSIS — M545 Low back pain, unspecified: Secondary | ICD-10-CM

## 2020-02-10 MED ORDER — METHYLPREDNISOLONE ACETATE 80 MG/ML IJ SUSP
80.0000 mg | Freq: Once | INTRAMUSCULAR | Status: AC
  Administered 2020-02-10: 80 mg via INTRAMUSCULAR
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
## 2020-02-10 NOTE — Patient Instructions (Signed)
° ° ° °  If you have lab work done today you will be contacted with your lab results within the next 2 weeks.  If you have not heard from us then please contact us. The fastest way to get your results is to register for My Chart. ° ° °IF you received an x-ray today, you will receive an invoice from [facility]. Please contact [facility] at [phone] with questions or concerns regarding your invoice.  ° °IF you received labwork today, you will receive an invoice from [facility]. Please contact [facility] at [phone] with questions or concerns regarding your invoice.  ° °Our billing staff will not be able to assist you with questions regarding bills from these companies. ° °You will be contacted with the lab results as soon as they are available. The fastest way to get your results is to activate your My Chart account. Instructions are located on the last page of this paperwork. If you have not heard from us regarding the results in 2 weeks, please contact this office. °  ° ° ° °
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
## 2020-02-16 DIAGNOSIS — Z1231 Encounter for screening mammogram for malignant neoplasm of breast: Secondary | ICD-10-CM | POA: Diagnosis not present

## 2020-02-16 LAB — HM MAMMOGRAPHY: HM Mammogram: ABNORMAL — AB (ref 0–4)

## 2020-02-20 ENCOUNTER — Ambulatory Visit (HOSPITAL_COMMUNITY)
Admission: RE | Admit: 2020-02-20 | Discharge: 2020-02-20 | Disposition: A | Discharge status: Home / Self Care | Payer: Medicare Other | Source: Ambulatory Visit | Attending: Emergency Medicine | Admitting: Emergency Medicine

## 2020-02-20 ENCOUNTER — Ambulatory Visit (INDEPENDENT_AMBULATORY_CARE_PROVIDER_SITE_OTHER): Payer: Medicare Other | Admitting: Pulmonary Disease

## 2020-02-20 ENCOUNTER — Encounter: Payer: Self-pay | Admitting: Pulmonary Disease

## 2020-02-20 ENCOUNTER — Other Ambulatory Visit: Payer: Self-pay

## 2020-02-20 ENCOUNTER — Ambulatory Visit: Payer: Medicare Other | Admitting: Registered Nurse

## 2020-02-20 VITALS — BP 124/82 | HR 98 | Temp 98.0°F | Ht 62.5 in | Wt 190.0 lb

## 2020-02-20 VITALS — BP 114/74 | HR 70 | Temp 97.3°F | Resp 16

## 2020-02-20 DIAGNOSIS — M549 Dorsalgia, unspecified: Secondary | ICD-10-CM | POA: Diagnosis not present

## 2020-02-20 DIAGNOSIS — R1030 Lower abdominal pain, unspecified: Secondary | ICD-10-CM

## 2020-02-20 DIAGNOSIS — R109 Unspecified abdominal pain: Secondary | ICD-10-CM

## 2020-02-20 DIAGNOSIS — R0602 Shortness of breath: Secondary | ICD-10-CM

## 2020-02-20 DIAGNOSIS — M545 Low back pain, unspecified: Secondary | ICD-10-CM

## 2020-02-20 LAB — POCT URINALYSIS DIPSTICK, ED / UC
Bilirubin Urine: NEGATIVE
Glucose, UA: NEGATIVE mg/dL
Ketones, ur: NEGATIVE mg/dL
Leukocytes,Ua: NEGATIVE
Nitrite: NEGATIVE
Protein, ur: NEGATIVE mg/dL
Specific Gravity, Urine: 1.015 (ref 1.005–1.030)
Urobilinogen, UA: 0.2 mg/dL (ref 0.0–1.0)
pH: 5.5 (ref 5.0–8.0)

## 2020-02-20 MED ORDER — ALBUTEROL SULFATE HFA 108 (90 BASE) MCG/ACT IN AERS: 2.0000 | INHALATION_SPRAY | RESPIRATORY_TRACT | 1 refills | Status: DC | PRN

## 2020-02-20 NOTE — Discharge Instructions (Signed)
Your urine does not show signs of infection.    Follow-up with your primary care provider as scheduled on Monday.    Go to the emergency department if you have acute worsening symptoms.

## 2020-02-20 NOTE — Progress Notes (Signed)
Lauren Chapman    [id number]    1970-05-23  Primary Care Physician:Greene, Ranell Patrick, MD  Referring Physician: Wendie Agreste, MD [address] [place],  [place] [address]  Chief complaint:   Shortness of breath, wheezing  HPI:  Stable symptoms Occasional shortness of breath Occasional wheezing  Continues to use inhalers as needed  Denies any fevers or chills Not sleeping well at night because of persistent wheezing and chest tightness  She did quit smoking for a while but went back to smoking because of stress She will continue to try to quit  Has no history of asthma, no history of obstructive lung disease Concern was for emphysema with large lung volumes on chest x-ray  Quit smoking for about 2 years in the past, resume smoking related to stress  Denies any chest pains or chest discomfort Denies any hemoptysis No weight loss No fevers or chills Persistent cough with shortness of breath and wheezing  Recent chest x-ray reviewed  She does have a history of bipolar disorder Chronic migraines GERD  Of concern today is that her weight did increase by almost 30 pounds compared to the last time she was here  Outpatient Encounter Medications as of 02/20/2020  Medication Sig  . albuterol (PROVENTIL HFA;VENTOLIN HFA) 108 (90 Base) MCG/ACT inhaler Inhale 2 puffs into the lungs every 4 (four) hours as needed for wheezing or shortness of breath.  Marland Kitchen aspirin-acetaminophen-caffeine (EXCEDRIN MIGRAINE) 250-250-65 MG tablet Take 2 tablets by mouth every 6 (six) hours as needed for headache.  . citalopram (CELEXA) 20 MG tablet Take 20 mg by mouth daily.  . citalopram (CELEXA) 20 MG tablet Take 20 mg by mouth daily.  . clonazePAM (KLONOPIN) 0.5 MG tablet Take 0.5 mg by mouth 3 (three) times daily.   Marland Kitchen esomeprazole (NEXIUM) 20 MG capsule Take 1 capsule (20 mg total) by mouth 2 (two) times daily before a meal. (Patient taking differently: Take 20 mg by mouth 2 (two)  times daily as needed (indigestion).)  . gabapentin (NEURONTIN) 800 MG tablet Take 800 mg by mouth 3 (three) times daily.   . Galcanezumab-gnlm (EMGALITY) 120 MG/ML SOAJ Inject 120 mg into the skin every 30 (thirty) days.  . Glucosamine HCl (GLUCOSAMINE PO) Take 1 tablet by mouth daily as needed (arthritis).  Marland Kitchen lamoTRIgine (LAMICTAL) 150 MG tablet Take 150 mg by mouth 2 (two) times daily.   Marland Kitchen lurasidone (LATUDA) 40 MG TABS tablet Take 40 mg by mouth daily with breakfast.  . nicotine (NICODERM CQ - DOSED IN MG/24 HOURS) 14 mg/24hr patch Place 1 patch (14 mg total) onto the skin daily.  . ondansetron (ZOFRAN) 4 MG tablet Take 1 tablet (4 mg total) by mouth every 8 (eight) hours as needed for nausea or vomiting.  . rizatriptan (MAXALT) 10 MG tablet Take 1 tab at onset of migraine.  May repeat in 2 hrs, if needed.  Max dose: 2 tabs/day. This is a 30 day prescription. (Patient taking differently: Take 1 tab at onset of migraine.  May repeat in 2 hrs, if needed.  Max dose: 2 tabs/day. This is a 30 day prescription.)  . tiZANidine (ZANAFLEX) 4 MG tablet Take 1 tablet (4 mg total) by mouth every 6 (six) hours as needed for muscle spasms. TAKE 1 TABLET BY MOUTH EVERY 6 HOURS AS NEEDED FOR MUSCLE SPASMS.  Marland Kitchen HYDROcodone-acetaminophen (NORCO) 5-325 MG tablet Take 1 every 6 hours only as needed for severe pain (Patient not  taking: Reported on 02/20/2020)  . mirtazapine (REMERON) 15 MG tablet Take 15 mg by mouth at bedtime. (Patient not taking: Reported on 02/20/2020)  . penicillin v potassium (VEETID) 500 MG tablet Take 1 tablet (500 mg total) by mouth 3 (three) times daily. (Patient not taking: Reported on 02/20/2020)  . sertraline (ZOLOFT) 100 MG tablet Take 300 mg by mouth daily. (Patient not taking: Reported on 02/20/2020)  . VRAYLAR 6 MG CAPS Take 6 mg by mouth daily. (Patient not taking: Reported on 02/20/2020)  . [DISCONTINUED] fluticasone (FLONASE) 50 MCG/ACT nasal spray Place 2 sprays into both nostrils daily.  (Patient not taking: No sig reported)   No facility-administered encounter medications on file as of 02/20/2020.    Allergies as of 02/20/2020 - Review Complete 02/20/2020  Allergen Reaction Noted  . Codeine Anaphylaxis 10/11/2011    Past Medical History:  Diagnosis Date  . Anxiety   . Bipolar affective psychosis ([place])   . Complication of anesthesia    " Acting silly- waving at everyone"-Giddy  . Depression   . Emphysema lung ([facility])    inhaler prn,  followed by pcp and pulmonologist-- dr Margorie John  . Family history of adverse reaction to anesthesia    Father had rheumatic fever, and died on operating table.  Marland Kitchen GERD (gastroesophageal reflux disease)   . Migraine   . Smokers' cough ([place])    per pt not productive  . SUI (stress urinary incontinence, female)   . Wears glasses     Past Surgical History:  Procedure Laterality Date  . ABDOMINAL HYSTERECTOMY N/A    Phreesia 11/30/2019  . CARPAL TUNNEL RELEASE Right 09-12-2007   @[email or web address]    and GANGLION CYST EXCISION  . CYSTOSCOPY N/A 04/29/2019   Procedure: CYSTOSCOPY;  Surgeon: Joseph Pierini, MD;  [facility]: [facility];  Service: Gynecology;  Laterality: N/A;  . DILATATION & CURETTAGE/HYSTEROSCOPY WITH MYOSURE N/A 11/13/2018   Procedure: [place];  Surgeon: Anastasio Auerbach, MD;  Location: [facility];  Service: Gynecology;  Laterality: N/A;  request 9:00am OR start time in [place] [facility] block requests one hour  . DILATION AND CURETTAGE OF UTERUS  02/2019  . EYE SURGERY N/A    Phreesia 11/30/2019  . GANGLION CYST EXCISION     R wrist  . KNEE ARTHROSCOPY WITH ANTERIOR CRUCIATE LIGAMENT (ACL) REPAIR Right 10/2015  . LAPAROSCOPIC CHOLECYSTECTOMY  1997  . LAPAROSCOPIC HYSTERECTOMY Bilateral 04/29/2019   Procedure: HYSTERECTOMY TOTAL LAPAROSCOPIC, BILATERAL SALPINO-OOPHORECTOMY;  Surgeon: Joseph Pierini, MD;  Location: [facility];  Service:  Gynecology;  Laterality: Bilateral;  . TOOTH EXTRACTION  04/01/2019    Family History  Problem Relation Age of Onset  . Depression Mother   . Cancer Mother        Cervical  . Alcohol abuse Father   . Crohn's disease Sister   . Stroke Brother 76  . Migraines Daughter   . GER disease Daughter   . Depression Daughter   . Migraines Daughter   . GER disease Daughter     Social History   Socioeconomic History  . Marital status: Divorced    Spouse name: n/a  . Number of children: 2  . Years of education: 76  . Highest education level: Not on file  Occupational History  . Occupation: Disabled    Comment: Formerly a Pharmacist, hospital.  Tobacco Use  . Smoking status: Former Smoker    Packs/day: 0.50    Years: 33.00    Pack  years: 16.50    Types: Cigarettes    Start date: 10/10/1981    Quit date: 11/10/2019    Years since quitting: 0.2  . Smokeless tobacco: Never Used  . Tobacco comment: 04/03/19   per pt down to 4-5 cig per day  Vaping Use  . Vaping Use: Never used  Substance and Sexual Activity  . Alcohol use: Never    Alcohol/week: 0.0 standard drinks  . Drug use: Never  . Sexual activity: Not on file    Comment: 1st intercourse 50 yo-More than 5 partners  Other Topics Concern  . Not on file  Social History Narrative   Lives at home with 1 of her two daughters   Right-handed.   2-4 cups caffeine daily.   Disabled    Social Determinants of Health   Financial Resource Strain: Not on file  Food Insecurity: Not on file  Transportation Needs: Not on file  Physical Activity: Not on file  Stress: Not on file  Social Connections: Not on file  Intimate Partner Violence: Not on file    Review of Systems  Constitutional: Negative for appetite change, fever and unexpected weight change.  HENT: Negative.   Eyes: Negative.   Respiratory: Positive for cough, shortness of breath and wheezing.   Cardiovascular: Negative.   Gastrointestinal: Negative.   Endocrine: Negative.      Vitals:   02/20/20 1518  BP: 124/82  Pulse: 98  Temp: 98 F (36.7 C)  SpO2: 96%     Physical Exam Constitutional:      Appearance: She is well-developed. She is obese.  HENT:     Head: Normocephalic.  Eyes:     General:        Right eye: No discharge.        Left eye: No discharge.  Neck:     Thyroid: No thyromegaly.  Cardiovascular:     Rate and Rhythm: Normal rate.  Pulmonary:     Effort: Pulmonary effort is normal. No respiratory distress.     Breath sounds: No stridor. No wheezing or rhonchi.  Musculoskeletal:     Cervical back: No rigidity or tenderness.  Neurological:     Mental Status: She is alert.  Psychiatric:        Mood and Affect: Mood normal.    Data Reviewed: Recent chest x-ray reviewed showing no acute infiltrate, hyperinflated lung volumes, flattened diaphragms PFT shows no obstruction, no significant bronchodilator response, normal TLC, normal diffusing capacity CT scan of the chest in January-within normal limits  Assessment:   Obstructive lung disease -PFT did not show significant obstruction -She benefits from using inhalers  She is an active smoker -Encouraged to continue working on quitting smoking   Plan/Recommendations:  .  Continue bronchodilators  .  Encouraged to call with any significant concerns   Follow-up in 6 months Sherrilyn Rist MD [facility] 02/20/2020, 3:23 PM  CC: Wendie Agreste, MD

## 2020-02-20 NOTE — ED Provider Notes (Signed)
[place]    CSN: [id number] Arrival date & time: 02/20/20  1639      History   Chief Complaint Chief Complaint  Patient presents with  . Abdominal Pain  . Back Pain    HPI Lauren Chapman is a 50 y.o. female.   Patient presents with 2-day history of lower abdominal "cramping" and low back pain.  She denies fever, chills, vomiting, diarrhea, dysuria, hematuria, vaginal discharge, pelvic pain, or other symptoms.  She states she has been incontinent of urine and stool for 2 to 3 years.  Patient was seen today by pulmonology; diagnosed with shortness of breath; treated with continued use of bronchodilators.  History includes stress urinary incontinence, bipolar disorder with psychosis, anxiety, depression, emphysema, chronic migraines.  The history is provided by the patient and medical records.    Past Medical History:  Diagnosis Date  . Anxiety   . Bipolar affective psychosis ([place])   . Complication of anesthesia    " Acting silly- waving at everyone"-Giddy  . Depression   . Emphysema lung ([facility])    inhaler prn,  followed by pcp and pulmonologist-- dr Margorie John  . Family history of adverse reaction to anesthesia    Father had rheumatic fever, and died on operating table.  Marland Kitchen GERD (gastroesophageal reflux disease)   . Migraine   . Smokers' cough ([place])    per pt not productive  . SUI (stress urinary incontinence, female)   . Wears glasses     Patient Active Problem List   Diagnosis Date Noted  . History of postmenopausal bleeding 04/29/2019  . Polypharmacy 04/03/2019  . Emphysema, unspecified ([place]) 05/17/2018  . Weakness 01/25/2017  . Chronic migraine w/o aura w/o status migrainosus, not intractable 12/22/2014  . Encounter for other general counseling or advice on contraception 09/02/2014  . Bipolar disorder ([place]) 10/12/2011  . Anxiety and depression 10/12/2011  . GERD (gastroesophageal reflux disease) 10/12/2011    Past Surgical History:  Procedure  Laterality Date  . ABDOMINAL HYSTERECTOMY N/A    Phreesia 11/30/2019  . CARPAL TUNNEL RELEASE Right 09-12-2007   @[email or web address]    and GANGLION CYST EXCISION  . CYSTOSCOPY N/A 04/29/2019   Procedure: CYSTOSCOPY;  Surgeon: Joseph Pierini, MD;  Location: [facility];  Service: Gynecology;  Laterality: N/A;  . DILATATION & CURETTAGE/HYSTEROSCOPY WITH MYOSURE N/A 11/13/2018   Procedure: [place];  Surgeon: Anastasio Auerbach, MD;  Location: [place];  Service: Gynecology;  Laterality: N/A;  request 9:00am OR start time in [place] [facility] block requests one hour  . DILATION AND CURETTAGE OF UTERUS  02/2019  . EYE SURGERY N/A    Phreesia 11/30/2019  . GANGLION CYST EXCISION     R wrist  . KNEE ARTHROSCOPY WITH ANTERIOR CRUCIATE LIGAMENT (ACL) REPAIR Right 10/2015  . LAPAROSCOPIC CHOLECYSTECTOMY  1997  . LAPAROSCOPIC HYSTERECTOMY Bilateral 04/29/2019   Procedure: HYSTERECTOMY TOTAL LAPAROSCOPIC, BILATERAL SALPINO-OOPHORECTOMY;  Surgeon: Joseph Pierini, MD;  Location: [facility];  Service: Gynecology;  Laterality: Bilateral;  . TOOTH EXTRACTION  04/01/2019    OB History    Gravida  3   Para  2   Term  2   Preterm  0   AB  1   Living  2     SAB  1   IAB  0   Ectopic  0   Multiple  0   Live Births  Home Medications    Prior to Admission medications   Medication Sig Start Date End Date Taking? Authorizing Provider  albuterol (VENTOLIN HFA) 108 (90 Base) MCG/ACT inhaler Inhale 2 puffs into the lungs every 4 (four) hours as needed for wheezing or shortness of breath. 02/20/20   Laurin Coder, MD  aspirin-acetaminophen-caffeine (EXCEDRIN MIGRAINE) [phone] MG tablet Take 2 tablets by mouth every 6 (six) hours as needed for headache.    [provider]  citalopram (CELEXA) 20 MG tablet Take 20 mg by mouth daily. 02/17/20   [provider]  citalopram (CELEXA)  20 MG tablet Take 20 mg by mouth daily.    [provider]  clonazePAM (KLONOPIN) 0.5 MG tablet Take 0.5 mg by mouth 3 (three) times daily.  12/04/17   [provider]  esomeprazole (NEXIUM) 20 MG capsule Take 1 capsule (20 mg total) by mouth 2 (two) times daily before a meal. Patient taking differently: Take 20 mg by mouth 2 (two) times daily as needed (indigestion). 03/21/19   Daleen Squibb, MD  gabapentin (NEURONTIN) 800 MG tablet Take 800 mg by mouth 3 (three) times daily.  07/02/18   [provider]  Galcanezumab-gnlm (EMGALITY) 120 MG/ML SOAJ Inject 120 mg into the skin every 30 (thirty) days. 01/05/20   Suzzanne Cloud, NP  Glucosamine HCl (GLUCOSAMINE PO) Take 1 tablet by mouth daily as needed (arthritis).    [provider]  HYDROcodone-acetaminophen (NORCO) 5-325 MG tablet Take 1 every 6 hours only as needed for severe pain Patient not taking: Reported on 02/20/2020 01/22/20   Posey Boyer, MD  lamoTRIgine (LAMICTAL) 150 MG tablet Take 150 mg by mouth 2 (two) times daily.     [provider]  lurasidone (LATUDA) 40 MG TABS tablet Take 40 mg by mouth daily with breakfast.    [provider]  mirtazapine (REMERON) 15 MG tablet Take 15 mg by mouth at bedtime. Patient not taking: Reported on 02/20/2020    [provider]  nicotine (NICODERM CQ - DOSED IN MG/24 HOURS) 14 mg/24hr patch Place 1 patch (14 mg total) onto the skin daily. 09/16/19   Jacelyn Pi, Lilia Argue, MD  ondansetron (ZOFRAN) 4 MG tablet Take 1 tablet (4 mg total) by mouth every 8 (eight) hours as needed for nausea or vomiting. 01/05/20   Suzzanne Cloud, NP  penicillin v potassium (VEETID) 500 MG tablet Take 1 tablet (500 mg total) by mouth 3 (three) times daily. Patient not taking: Reported on 02/20/2020 01/22/20   Posey Boyer, MD  rizatriptan (MAXALT) 10 MG tablet Take 1 tab at onset of migraine.  May repeat in 2 hrs, if needed.  Max dose: 2 tabs/day. This is  a 30 day prescription. Patient taking differently: Take 1 tab at onset of migraine.  May repeat in 2 hrs, if needed.  Max dose: 2 tabs/day. This is a 30 day prescription. 01/05/20   Suzzanne Cloud, NP  sertraline (ZOLOFT) 100 MG tablet Take 300 mg by mouth daily. Patient not taking: Reported on 02/20/2020    [provider]  tiZANidine (ZANAFLEX) 4 MG tablet Take 1 tablet (4 mg total) by mouth every 6 (six) hours as needed for muscle spasms. TAKE 1 TABLET BY MOUTH EVERY 6 HOURS AS NEEDED FOR MUSCLE SPASMS. 01/05/20   Suzzanne Cloud, NP  VRAYLAR 6 MG CAPS Take 6 mg by mouth daily. Patient not taking: Reported on 02/20/2020 06/17/19   [provider]  fluticasone (FLONASE) 50 MCG/ACT nasal spray Place 2 sprays into both nostrils daily. Patient not taking: No sig reported 09/24/18 10/22/18  Jacelyn Pi, Lilia Argue, MD    Family History Family History  Problem Relation Age of Onset  . Depression Mother   . Cancer Mother        Cervical  . Alcohol abuse Father   . Crohn's disease Sister   . Stroke Brother 88  . Migraines Daughter   . GER disease Daughter   . Depression Daughter   . Migraines Daughter   . GER disease Daughter     Social History Social History   Tobacco Use  . Smoking status: Former Smoker    Packs/day: 0.50    Years: 33.00    Pack years: 16.50    Types: Cigarettes    Start date: 10/10/1981    Quit date: 11/10/2019    Years since quitting: 0.2  . Smokeless tobacco: Never Used  . Tobacco comment: 04/03/19   per pt down to 4-5 cig per day  Vaping Use  . Vaping Use: Never used  Substance Use Topics  . Alcohol use: Never    Alcohol/week: 0.0 standard drinks  . Drug use: Never     Allergies   Codeine   Review of Systems Review of Systems  Constitutional: Negative for chills and fever.  HENT: Negative for ear pain and sore throat.   Eyes: Negative for pain and visual disturbance.  Respiratory: Negative for cough and shortness of breath.    Cardiovascular: Negative for chest pain and palpitations.  Gastrointestinal: Positive for abdominal pain. Negative for nausea and vomiting.  Genitourinary: Negative for dysuria, hematuria, pelvic pain and vaginal discharge.  Musculoskeletal: Positive for back pain. Negative for arthralgias.  Skin: Negative for color change and rash.  Neurological: Negative for seizures and syncope.  All other systems reviewed and are negative.    Physical Exam Triage Vital Signs ED Triage Vitals  Enc Vitals Group     BP      Pulse      Resp      Temp      Temp src      SpO2      Weight      Height      Head Circumference      Peak Flow      Pain Score      Pain Loc      Pain Edu?      Excl. in [place]?    No data found.  Updated Vital Signs BP 114/74 (BP Location: Right Arm)   Pulse 70   Temp (!) 97.3 F (36.3 C) (Oral)   Resp 16   LMP 05/02/2019 Comment: irregular cycles  SpO2 96%   Visual Acuity Right Eye Distance:   Left Eye Distance:   Bilateral Distance:    Right Eye Near:   Left Eye Near:    Bilateral Near:     Physical Exam Vitals and nursing note reviewed.  Constitutional:      General: She is not in acute distress.    Appearance: She is well-developed and well-nourished. She is not ill-appearing.  HENT:     Head: Normocephalic and atraumatic.     Mouth/Throat:     Mouth: Mucous membranes are moist.  Eyes:     Conjunctiva/sclera: Conjunctivae normal.  Cardiovascular:     Rate and Rhythm: Normal rate and regular rhythm.     Heart sounds: Normal heart sounds.  Pulmonary:  Effort: Pulmonary effort is normal. No respiratory distress.     Breath sounds: Normal breath sounds.  Abdominal:     General: Bowel sounds are normal.     Palpations: Abdomen is soft.     Tenderness: There is no abdominal tenderness. There is no right CVA tenderness, left CVA tenderness, guarding or rebound.  Musculoskeletal:        General: No edema.     Cervical back: Neck supple.   Skin:    General: Skin is warm and dry.  Neurological:     General: No focal deficit present.     Mental Status: She is alert and oriented to person, place, and time.     Gait: Gait normal.  Psychiatric:        Mood and Affect: Mood and affect and mood normal.        Behavior: Behavior normal.      UC Treatments / Results  Labs (all labs ordered are listed, but only abnormal results are displayed) Labs Reviewed  POCT URINALYSIS DIPSTICK, ED / UC - Abnormal; Notable for the following components:      Result Value   Hgb urine dipstick TRACE (*)    All other components within normal limits    EKG   Radiology No results found.  Procedures Procedures (including critical care time)  Medications Ordered in UC Medications - No data to display  Initial Impression / Assessment and Plan / UC Course  I have reviewed the triage vital signs and the nursing notes.  Pertinent labs & imaging results that were available during my care of the patient were reviewed by me and considered in my medical decision making (see chart for details).   Lower abdominal pain, low back pain.  Patient is well-appearing and her exam is reassuring.  Urine does not indicate infection.  Patient reports she has an appointment with her PCP on Monday; instructed her to follow-up as scheduled.  Precautions given for ED if she has acute worsening symptoms.  Patient agrees to plan of care.   Final Clinical Impressions(s) / UC Diagnoses   Final diagnoses:  Lower abdominal pain  Acute bilateral low back pain without sciatica     Discharge Instructions     Your urine does not show signs of infection.    Follow-up with your primary care provider as scheduled on Monday.    Go to the emergency department if you have acute worsening symptoms.        ED Prescriptions    None     PDMP not reviewed this encounter.   Sharion Balloon, NP 02/20/20 [phone]

## 2020-02-20 NOTE — ED Triage Notes (Signed)
Pt present abdominal and back pain. Pt state that symptoms started two days ago. Pt states that she wears brief because she cannot control her urine and bowel movement. Pt thinks that the incontinence is due to the abdominal and back pain

## 2020-02-20 NOTE — Patient Instructions (Signed)
Continue bronchodilator treatments  Call with any significant concerns  Continue to try to stay off cigarettes  I will see you back in 6 months

## 2020-02-23 ENCOUNTER — Other Ambulatory Visit: Payer: Self-pay

## 2020-02-23 ENCOUNTER — Encounter: Payer: Self-pay | Admitting: Registered Nurse

## 2020-02-23 ENCOUNTER — Ambulatory Visit (INDEPENDENT_AMBULATORY_CARE_PROVIDER_SITE_OTHER): Payer: Medicare Other | Admitting: Registered Nurse

## 2020-02-23 VITALS — BP 115/74 | HR 91 | Temp 98.6°F | Resp 18 | Ht 62.5 in | Wt 186.4 lb

## 2020-02-23 DIAGNOSIS — K591 Functional diarrhea: Secondary | ICD-10-CM

## 2020-02-23 DIAGNOSIS — R159 Full incontinence of feces: Secondary | ICD-10-CM | POA: Diagnosis not present
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
## 2020-02-23 NOTE — Patient Instructions (Signed)
° ° ° °  If you have lab work done today you will be contacted with your lab results within the next 2 weeks.  If you have not heard from us then please contact us. The fastest way to get your results is to register for My Chart. ° ° °IF you received an x-ray today, you will receive an invoice from [facility]. Please contact [facility] at [phone] with questions or concerns regarding your invoice.  ° °IF you received labwork today, you will receive an invoice from [facility]. Please contact [facility] at [phone] with questions or concerns regarding your invoice.  ° °Our billing staff will not be able to assist you with questions regarding bills from these companies. ° °You will be contacted with the lab results as soon as they are available. The fastest way to get your results is to activate your My Chart account. Instructions are located on the last page of this paperwork. If you have not heard from us regarding the results in 2 weeks, please contact this office. °  ° ° ° °

## 2020-02-25 ENCOUNTER — Ambulatory Visit (INDEPENDENT_AMBULATORY_CARE_PROVIDER_SITE_OTHER): Payer: Medicare Other | Admitting: Neurology

## 2020-02-25 ENCOUNTER — Ambulatory Visit
Admission: RE | Admit: 2020-02-25 | Discharge: 2020-02-25 | Disposition: A | Discharge status: Home / Self Care | Payer: Medicare Other | Source: Ambulatory Visit | Attending: Neurology | Admitting: Neurology

## 2020-02-25 ENCOUNTER — Telehealth: Payer: Self-pay | Admitting: Neurology

## 2020-02-25 ENCOUNTER — Encounter: Payer: Self-pay | Admitting: Neurology

## 2020-02-25 ENCOUNTER — Other Ambulatory Visit: Payer: Self-pay | Admitting: Neurology

## 2020-02-25 VITALS — BP 118/76 | HR 75 | Ht 62.5 in | Wt 185.0 lb

## 2020-02-25 DIAGNOSIS — M545 Low back pain, unspecified: Secondary | ICD-10-CM | POA: Diagnosis not present

## 2020-02-25 DIAGNOSIS — N39498 Other specified urinary incontinence: Secondary | ICD-10-CM

## 2020-02-25 DIAGNOSIS — M25552 Pain in left hip: Secondary | ICD-10-CM | POA: Diagnosis not present

## 2020-02-25 DIAGNOSIS — G43709 Chronic migraine without aura, not intractable, without status migrainosus: Secondary | ICD-10-CM

## 2020-02-25 DIAGNOSIS — R32 Unspecified urinary incontinence: Secondary | ICD-10-CM | POA: Insufficient documentation

## 2020-02-25 DIAGNOSIS — M5416 Radiculopathy, lumbar region: Secondary | ICD-10-CM | POA: Diagnosis not present

## 2020-02-25 NOTE — Progress Notes (Signed)
Katharine LookRoosevelt [place] of left hip show no significant degenerative changes.  Marcial Pacas, M.D. Ph.D.  [facility] [place],  [address] Phone: [phone] Fax:      [phone]

## 2020-02-25 NOTE — Progress Notes (Signed)
HISTORY OF PRESENT ILLNESS: Lauren Chapman a 50 year old right-handed female, seen in refer by her primary care Lauren Costa, NP for evaluation of migraine in October 19 2014  I reviewed and summarized her office visit, reviewed laboratory in July 2016, mild elevated triglycerides 208,  She has a history of bipolar affective disorder, is on polypharmacy treatment, this including lamotrigine, Zoloft, Brintellix, Valium, Cogentin, gabapentin.  She started to have migraine since 50 years old, her typical migraine are lateralized severe pounding headache was associated light noise sensitivity, lasting 1-2 days, she often has alternating of sound, visual disturbance before the onset of migraine, and during severe headaches. over the years, she has had at least twice migraine each week, she has been taking frequent goody powders, which does not work anymore, now she take over-the-counter ibuprofen and Aleve, up to 10 tablets each day to try to get rid of headache with suboptimal response. She was giving a prescription of Imitrex upon why ER presentation for her migraines, which does not work well, she reported loose control of her bowel movements after taking Imitrex.   She was not able to identify trigger for her migraine, she does couple days each week because of her severe headaches, she has never tried any preventive medications in the past  For migraine prevention, she has tried Inderal, Botox injection by rehabilitation physician Dr. Posey Pronto in January 20 17-2019, which was helpful,  She reported taking Excedrin Migraine about every other day for mild to moderate headaches  Then she began to have frequent headaches again since early 2021,While she was still on still on polypharmacy for her bipolar disorder, Zoloft 200 mg every day, Remeron 15 mg at bedtime, lamotrigine 150 mg twice a day, gabapentin 800 mg 3 times a day, clonazepam 0.5 mg 3 times a day, Vraylar 4.5 mg at  bedtime  She complains of migraine on a daily basis in early 2021, taking multiple doses of Excedrin Migraine, was started on Ajovy since summer 2021, eventually was switched to [employer] due to insurance preference, her migraine was under much better control,  Update February 25, 2020: Lauren Chapman came in today with new complaints of chronic low back pain, has been ongoing for 3 years, getting worse since 2021, now she complains of constant left low back pain, radiating pain to left lower extremity, dragging her left leg in the morning  In addition, she also complains of slow worsening bowel and bladder incontinence, especially since April 2022, she stated sometimes she sitting there without knowing she has wet herself, even soiled herself sometimes, accident happened on a daily basis, she is afraid to eat or go out because of that  EMG nerve conduction study in 2019 was normal, there was no evidence of cervical radiculopathy or lumbar radiculopathy REVIEW OF SYSTEMS: Out of a complete 14 system review of symptoms, the patient complains only of the following symptoms, and all other reviewed systems are negative.  Headache   ALLERGIES: Allergies  Allergen Reactions  . Codeine Anaphylaxis    Anything with codeine    HOME MEDICATIONS: Outpatient Medications Prior to Visit  Medication Sig Dispense Refill  . albuterol (VENTOLIN HFA) 108 (90 Base) MCG/ACT inhaler Inhale 2 puffs into the lungs every 4 (four) hours as needed for wheezing or shortness of breath. 1 each 1  . aspirin-acetaminophen-caffeine (EXCEDRIN MIGRAINE) 250-250-65 MG tablet Take 2 tablets by mouth every 6 (six) hours as needed for headache.    . citalopram (CELEXA) 20 MG tablet  Take 20 mg by mouth daily.    . clonazePAM (KLONOPIN) 0.5 MG tablet Take 0.5 mg by mouth 3 (three) times daily.   2  . esomeprazole (NEXIUM) 20 MG capsule Take 1 capsule (20 mg total) by mouth 2 (two) times daily before a meal. (Patient taking differently:  Take 20 mg by mouth 2 (two) times daily as needed (indigestion).) 60 capsule 1  . gabapentin (NEURONTIN) 800 MG tablet Take 800 mg by mouth 3 (three) times daily.     . Galcanezumab-gnlm (EMGALITY) 120 MG/ML SOAJ Inject 120 mg into the skin every 30 (thirty) days. 1 mL 11  . Glucosamine HCl (GLUCOSAMINE PO) Take 1 tablet by mouth daily as needed (arthritis).    Marland Kitchen lamoTRIgine (LAMICTAL) 150 MG tablet Take 150 mg by mouth 2 (two) times daily.     Marland Kitchen lurasidone (LATUDA) 40 MG TABS tablet Take 40 mg by mouth daily with breakfast.    . nicotine (NICODERM CQ - DOSED IN MG/24 HOURS) 14 mg/24hr patch Place 1 patch (14 mg total) onto the skin daily. 28 patch 1  . ondansetron (ZOFRAN) 4 MG tablet Take 1 tablet (4 mg total) by mouth every 8 (eight) hours as needed for nausea or vomiting. 20 tablet 6  . rizatriptan (MAXALT) 10 MG tablet Take 1 tab at onset of migraine.  May repeat in 2 hrs, if needed.  Max dose: 2 tabs/day. This is a 30 day prescription. (Patient taking differently: Take 1 tab at onset of migraine.  May repeat in 2 hrs, if needed.  Max dose: 2 tabs/day. This is a 30 day prescription.) 12 tablet 6  . tiZANidine (ZANAFLEX) 4 MG tablet Take 1 tablet (4 mg total) by mouth every 6 (six) hours as needed for muscle spasms. TAKE 1 TABLET BY MOUTH EVERY 6 HOURS AS NEEDED FOR MUSCLE SPASMS. 30 tablet 6  . citalopram (CELEXA) 20 MG tablet Take 20 mg by mouth daily.    Marland Kitchen HYDROcodone-acetaminophen (NORCO) 5-325 MG tablet Take 1 every 6 hours only as needed for severe pain (Patient not taking: No sig reported) 30 tablet 0  . mirtazapine (REMERON) 15 MG tablet Take 15 mg by mouth at bedtime. (Patient not taking: No sig reported)    . penicillin v potassium (VEETID) 500 MG tablet Take 1 tablet (500 mg total) by mouth 3 (three) times daily. (Patient not taking: No sig reported) 21 tablet 0  . sertraline (ZOLOFT) 100 MG tablet Take 300 mg by mouth daily. (Patient not taking: No sig reported)    . VRAYLAR 6 MG CAPS  Take 6 mg by mouth daily.     No facility-administered medications prior to visit.    PAST MEDICAL HISTORY: Past Medical History:  Diagnosis Date  . Anxiety   . Bipolar affective psychosis ([place])   . Complication of anesthesia    " Acting silly- waving at everyone"-Giddy  . Depression   . Emphysema lung ([facility])    inhaler prn,  followed by pcp and pulmonologist-- dr Margorie John  . Family history of adverse reaction to anesthesia    Father had rheumatic fever, and died on operating table.  Marland Kitchen GERD (gastroesophageal reflux disease)   . Migraine   . Smokers' cough ([place])    per pt not productive  . SUI (stress urinary incontinence, female)   . Wears glasses     PAST SURGICAL HISTORY: Past Surgical History:  Procedure Laterality Date  . ABDOMINAL HYSTERECTOMY N/A    Phreesia 11/30/2019  . CARPAL  TUNNEL RELEASE Right 09-12-2007   @[email or web address]    and GANGLION CYST EXCISION  . CYSTOSCOPY N/A 04/29/2019   Procedure: CYSTOSCOPY;  Surgeon: Joseph Pierini, MD;  Location[facility];  Service: Gynecology;  Laterality: N/A;  . DILATATION & CURETTAGE/HYSTEROSCOPY WITH MYOSURE N/A 11/13/2018   Procedure: [place];  Surgeon: Anastasio Auerbach, MD;  Location: [facility];  Service: Gynecology;  Laterality: N/A;  request 9:00am OR start time in [place] [facility] block requests one hour  . DILATION AND CURETTAGE OF UTERUS  02/2019  . EYE SURGERY N/A    Phreesia 11/30/2019  . GANGLION CYST EXCISION     R wrist  . KNEE ARTHROSCOPY WITH ANTERIOR CRUCIATE LIGAMENT (ACL) REPAIR Right 10/2015  . LAPAROSCOPIC CHOLECYSTECTOMY  1997  . LAPAROSCOPIC HYSTERECTOMY Bilateral 04/29/2019   Procedure: HYSTERECTOMY TOTAL LAPAROSCOPIC, BILATERAL SALPINO-OOPHORECTOMY;  Surgeon: Joseph Pierini, MD;  Location: [place];  Service: Gynecology;  Laterality: Bilateral;  . TOOTH EXTRACTION  04/01/2019    FAMILY HISTORY: Family History   Problem Relation Age of Onset  . Depression Mother   . Cancer Mother        Cervical  . Alcohol abuse Father   . Crohn's disease Sister   . Stroke Brother 34  . Migraines Daughter   . GER disease Daughter   . Depression Daughter   . Migraines Daughter   . GER disease Daughter     SOCIAL HISTORY: Social History   Socioeconomic History  . Marital status: Divorced    Spouse name: n/a  . Number of children: 2  . Years of education: 57  . Highest education level: Not on file  Occupational History  . Occupation: Disabled    Comment: Formerly a Pharmacist, hospital.  Tobacco Use  . Smoking status: Former Smoker    Packs/day: 0.50    Years: 33.00    Pack years: 16.50    Types: Cigarettes    Start date: 10/10/1981    Quit date: 11/10/2019    Years since quitting: 0.2  . Smokeless tobacco: Never Used  . Tobacco comment: 04/03/19   per pt down to 4-5 cig per day  Vaping Use  . Vaping Use: Never used  Substance and Sexual Activity  . Alcohol use: Never    Alcohol/week: 0.0 standard drinks  . Drug use: Never  . Sexual activity: Not on file    Comment: 1st intercourse 50 yo-More than 5 partners  Other Topics Concern  . Not on file  Social History Narrative   Lives at home with 1 of her two daughters   Right-handed.   2-4 cups caffeine daily.   Disabled    Social Determinants of Health   Financial Resource Strain: Not on file  Food Insecurity: Not on file  Transportation Needs: Not on file  Physical Activity: Not on file  Stress: Not on file  Social Connections: Not on file  Intimate Partner Violence: Not on file   PHYSICAL EXAM  Vitals:   02/25/20 0849  BP: 118/76  Pulse: 75  Weight: 185 lb (83.9 kg)  Height: 5' 2.5" (1.588 m)   Body mass index is 33.3 kg/m.   PHYSICAL EXAMNIATION:  Gen: NAD, conversant, well nourised, well groomed                     Cardiovascular: Regular rate rhythm, no peripheral edema, warm, nontender. Eyes: Conjunctivae clear without  exudates or hemorrhage Neck: Supple, no carotid bruits. Pulmonary:  Clear to auscultation bilaterally   NEUROLOGICAL EXAM:  MENTAL STATUS: Speech:    Speech is normal; fluent and spontaneous with normal comprehension.  Cognition:     Orientation to time, place and person     Normal recent and remote memory     Normal Attention span and concentration     Normal Language, naming, repeating,spontaneous speech     Fund of knowledge   CRANIAL NERVES: CN II: Visual fields are full to confrontation.  Pupils are round equal and briskly reactive to light. CN III, IV, VI: extraocular movement are normal. No ptosis. CN V: Facial sensation is intact to pinprick in all 3 divisions bilaterally. Corneal responses are intact.  CN VII: Face is symmetric with normal eye closure and smile. CN VIII: Hearing is normal to casual conversation CN IX, X: Palate elevates symmetrically. Phonation is normal. CN XI: Head turning and shoulder shrug are intact CN XII: Tongue is midline with normal movements and no atrophy.  MOTOR: Variable effort on motor examination, felt there was no significant bilateral upper or lower extremity proximal and distal muscle weakness,  REFLEXES: Reflexes are 2+ and symmetric at the biceps, triceps, knees, and ankles. Plantar responses are flexor.  SENSORY: Intact to light touch, pinprick, positional and vibratory sensation are intact in fingers and toes.  COORDINATION: Rapid alternating movements and fine finger movements are intact. There is no dysmetria on finger-to-nose and heel-knee-shin.    GAIT/STANCE: She can get up from seated position, arms crossed, steady, mild difficulty with tandem, tiptoe and heel walking    DIAGNOSTIC DATA (LABS, IMAGING, TESTING) - I reviewed patient records, labs, notes, testing and imaging myself where available.  Lab Results  Component Value Date   WBC 8.2 11/11/2019   HGB 13.3 11/11/2019   HCT 40.4 11/11/2019   MCV 91.6  11/11/2019   PLT 255 11/11/2019      Component Value Date/Time   NA 141 11/11/2019 0926   NA 143 03/24/2019 1510   K 4.4 11/11/2019 0926   CL 102 11/11/2019 0926   CO2 28 11/11/2019 0926   GLUCOSE 99 11/11/2019 0926   BUN 6 11/11/2019 0926   BUN 10 03/24/2019 1510   CREATININE 0.97 11/11/2019 0926   CREATININE 1.12 (H) 08/17/2015 1608   CALCIUM 9.4 11/11/2019 0926   PROT 6.9 03/24/2019 1510   ALBUMIN 4.4 03/24/2019 1510   AST 14 03/24/2019 1510   ALT 13 03/24/2019 1510   ALKPHOS 98 03/24/2019 1510   BILITOT <0.2 03/24/2019 1510   GFRNONAA >60 11/11/2019 0926   GFRAA 71 03/24/2019 1510   Lab Results  Component Value Date   CHOL 179 10/11/2011   HDL 50 10/11/2011   LDLCALC 103 (H) 10/11/2011   TRIG 130 10/11/2011   CHOLHDL 3.6 10/11/2011   No results found for: HGBA1C No results found for: VITAMINB12 Lab Results  Component Value Date   TSH 0.86 09/09/2018    ASSESSMENT AND PLAN 50 y.o. year old female    Chronic migraine headache  Much improved since Emgality, to 3 migraine each month, well controlled with Maxalt  MRI of the brain in January 2019 was normal  Polypharmacy treatment  On Vraylar, Klonopin, gabapentin, Lamictal, Remeron, Zoloft  Chronic low back pain, worsened since 2021, also complains bowel and bladder incontinence  Variable effort on motor examination, especially left lower extremity,  Brisk reflex of bilateral upper extremity, patella,  Need to rule out cervical spondylitic myelopathy, left lumbar radiculopathy  Proceed with MRI of  cervical spine and lumbar  Marcial Pacas, M.D. Ph.D.  [facility] [place],  [address] Phone: [phone] Fax:      [phone]

## 2020-02-25 NOTE — Patient Instructions (Signed)
[facility]    Address: [address], [place], [place] [address]  Phone: [phone]   

## 2020-02-25 NOTE — Telephone Encounter (Signed)
[employer] medicare/medicaid order sent to [facility]. No auth they will reach out to the patient to schedule.  °

## 2020-03-09 ENCOUNTER — Ambulatory Visit
Admission: RE | Admit: 2020-03-09 | Discharge: 2020-03-09 | Disposition: A | Discharge status: Home / Self Care | Payer: Medicare Other | Source: Ambulatory Visit | Attending: Neurology | Admitting: Neurology

## 2020-03-09 ENCOUNTER — Other Ambulatory Visit: Payer: Self-pay

## 2020-03-09 DIAGNOSIS — M5416 Radiculopathy, lumbar region: Secondary | ICD-10-CM | POA: Diagnosis not present

## 2020-03-09 DIAGNOSIS — G43709 Chronic migraine without aura, not intractable, without status migrainosus: Secondary | ICD-10-CM

## 2020-03-09 DIAGNOSIS — M542 Cervicalgia: Secondary | ICD-10-CM | POA: Diagnosis not present

## 2020-03-09 DIAGNOSIS — N39498 Other specified urinary incontinence: Secondary | ICD-10-CM

## 2020-03-10 ENCOUNTER — Telehealth: Payer: Self-pay | Admitting: Neurology

## 2020-03-10 DIAGNOSIS — N39498 Other specified urinary incontinence: Secondary | ICD-10-CM

## 2020-03-10 NOTE — Telephone Encounter (Signed)
   IMPRESSION:   MRI cervical spine (without) demonstrating: - Mild spondylosis and disc bulging from C3-4 down to C6-7.  - At C4-5: disc bulging with mild left foraminal stenosis.   IMPRESSION:   Unremarkable MRI lumbar spine (without). No spinal stenosis or foraminal narrowing.   Incidental large right renal cyst measuring 5.3cm. Correlate clinically.   Please call patient, MRI of cervical spine showed mild degenerative changes, there is no evidence of spinal cord or nerve compression  MRI of lumbar spine showed no significant abnormality  There was incidental finding of large right renal cyst measuring up to 5.3 cm  I have forward the imaging findings to her primary care physician Wendie Agreste, MD, she may contact him for further evaluation of right renal cyst.

## 2020-03-11 ENCOUNTER — Telehealth: Payer: Self-pay | Admitting: Neurology

## 2020-03-11 NOTE — Addendum Note (Signed)
Addended by: Marcial Pacas on: 03/11/2020 01:51 PM   Modules accepted: Orders

## 2020-03-11 NOTE — Telephone Encounter (Signed)
I have ordered MRI of the brain, and thoracic spine to complete evaluation for her reported bowel and bladder incontinence  Possibility also including polypharmacy treatment   Incidental finding of large right renal cyst measuring 5.3 cm, she should follow-up with her primary care for them.

## 2020-03-11 NOTE — Telephone Encounter (Signed)
Pt called wanting to know if the RN can go over these results with her. Please advise.

## 2020-03-11 NOTE — Telephone Encounter (Signed)
[employer] medicare/medicaid order sent to [facility]. No auth they will reach out to the patient to schedule.  °

## 2020-03-11 NOTE — Telephone Encounter (Signed)
Called patient and informed her of Dr Rhea Belton message, new MRI orders and to contact PCP re: renal cyst. She verbalized understanding, appreciation.

## 2020-03-11 NOTE — Telephone Encounter (Addendum)
Called patient who had questions regarding MRI results. She asked if Dr Krista Blue has any other testing she'd recommend for her back pain, urinary and bowel incontinence. She also wants to know what may need to be done about the cyst mentioned in MRI cervical spine result. I advised she can discuss incontinence issues with PCP, will let Dr Krista Blue know of her questions and call her with reply. Patient verbalized understanding, appreciation.

## 2020-03-14 ENCOUNTER — Emergency Department (HOSPITAL_COMMUNITY)
Admission: EM | Admit: 2020-03-14 | Discharge: 2020-03-14 | Disposition: A | Discharge status: Home / Self Care | Payer: Medicare Other | Attending: Emergency Medicine | Admitting: Emergency Medicine

## 2020-03-14 ENCOUNTER — Encounter (HOSPITAL_COMMUNITY): Payer: Self-pay

## 2020-03-14 ENCOUNTER — Other Ambulatory Visit: Payer: Self-pay

## 2020-03-14 ENCOUNTER — Emergency Department (HOSPITAL_COMMUNITY): Payer: Medicare Other

## 2020-03-14 DIAGNOSIS — K219 Gastro-esophageal reflux disease without esophagitis: Secondary | ICD-10-CM | POA: Insufficient documentation

## 2020-03-14 DIAGNOSIS — Z87891 Personal history of nicotine dependence: Secondary | ICD-10-CM | POA: Diagnosis not present

## 2020-03-14 DIAGNOSIS — R110 Nausea: Secondary | ICD-10-CM | POA: Insufficient documentation

## 2020-03-14 DIAGNOSIS — N390 Urinary tract infection, site not specified: Secondary | ICD-10-CM | POA: Insufficient documentation

## 2020-03-14 DIAGNOSIS — R1013 Epigastric pain: Secondary | ICD-10-CM | POA: Diagnosis present

## 2020-03-14 DIAGNOSIS — R109 Unspecified abdominal pain: Secondary | ICD-10-CM | POA: Diagnosis not present

## 2020-03-14 DIAGNOSIS — K760 Fatty (change of) liver, not elsewhere classified: Secondary | ICD-10-CM | POA: Diagnosis not present

## 2020-03-14 LAB — URINALYSIS, ROUTINE W REFLEX MICROSCOPIC
Bilirubin Urine: NEGATIVE
Glucose, UA: NEGATIVE mg/dL
Ketones, ur: NEGATIVE mg/dL
Nitrite: POSITIVE — AB
Protein, ur: 30 mg/dL — AB
RBC / HPF: >50 RBC/hpf — ABNORMAL HIGH (ref 0–5)
Specific Gravity, Urine: 1.012 (ref 1.005–1.030)
WBC, UA: >50 WBC/hpf — ABNORMAL HIGH (ref 0–5)
pH: 5 (ref 5.0–8.0)

## 2020-03-14 LAB — COMPREHENSIVE METABOLIC PANEL
ALT: 26 U/L (ref 0–44)
AST: 18 U/L (ref 15–41)
Albumin: 4.3 g/dL (ref 3.5–5.0)
Alkaline Phosphatase: 85 U/L (ref 38–126)
Anion gap: 10 (ref 5–15)
BUN: 9 mg/dL (ref 6–20)
CO2: 30 mmol/L (ref 22–32)
Calcium: 9.2 mg/dL (ref 8.9–10.3)
Chloride: 99 mmol/L (ref 98–111)
Creatinine, Ser: 1.23 mg/dL — ABNORMAL HIGH (ref 0.44–1.00)
GFR, Estimated: 53 mL/min — ABNORMAL LOW (ref >60)
Glucose, Bld: 96 mg/dL (ref 70–99)
Potassium: 3.7 mmol/L (ref 3.5–5.1)
Sodium: 139 mmol/L (ref 135–145)
Total Bilirubin: 0.8 mg/dL (ref 0.3–1.2)
Total Protein: 7.5 g/dL (ref 6.5–8.1)

## 2020-03-14 LAB — CBC WITH DIFFERENTIAL/PLATELET
Abs Immature Granulocytes: 0.03 10*3/uL (ref 0.00–0.07)
Basophils Absolute: 0 10*3/uL (ref 0.0–0.1)
Basophils Relative: 0 %
Eosinophils Absolute: 0.1 10*3/uL (ref 0.0–0.5)
Eosinophils Relative: 1 %
HCT: 42.5 % (ref 36.0–46.0)
Hemoglobin: 14.3 g/dL (ref 12.0–15.0)
Immature Granulocytes: 0 %
Lymphocytes Relative: 15 %
Lymphs Abs: 1.3 10*3/uL (ref 0.7–4.0)
MCH: 30.2 pg (ref 26.0–34.0)
MCHC: 33.6 g/dL (ref 30.0–36.0)
MCV: 89.9 fL (ref 80.0–100.0)
Monocytes Absolute: 0.4 10*3/uL (ref 0.1–1.0)
Monocytes Relative: 5 %
Neutro Abs: 7.2 10*3/uL (ref 1.7–7.7)
Neutrophils Relative %: 79 %
Platelets: 204 10*3/uL (ref 150–400)
RBC: 4.73 MIL/uL (ref 3.87–5.11)
RDW: 14.3 % (ref 11.5–15.5)
WBC: 9.1 10*3/uL (ref 4.0–10.5)
nRBC: 0 % (ref 0.0–0.2)

## 2020-03-14 MED ORDER — SODIUM CHLORIDE 0.9 % IV SOLN
1.0000 g | Freq: Once | INTRAVENOUS | Status: AC
  Administered 2020-03-14: 1 g via INTRAVENOUS
  Filled 2020-03-14: qty 10

## 2020-03-14 MED ORDER — CEPHALEXIN 500 MG PO CAPS
500.0000 mg | ORAL_CAPSULE | Freq: Three times a day (TID) | ORAL | 0 refills | Status: DC
Start: 2020-03-14 — End: 2020-03-24

## 2020-03-14 MED ORDER — ONDANSETRON 4 MG PO TBDP: 4.0000 mg | ORAL_TABLET | Freq: Three times a day (TID) | ORAL | 0 refills | Status: DC | PRN

## 2020-03-14 MED ORDER — MORPHINE SULFATE (PF) 4 MG/ML IV SOLN
4.0000 mg | Freq: Once | INTRAVENOUS | Status: AC
  Administered 2020-03-14: 4 mg via INTRAVENOUS
  Filled 2020-03-14: qty 1

## 2020-03-14 MED ORDER — IOHEXOL 300 MG/ML  SOLN
100.0000 mL | Freq: Once | INTRAMUSCULAR | Status: AC | PRN
  Administered 2020-03-14: 100 mL via INTRAVENOUS

## 2020-03-14 NOTE — ED Notes (Signed)
Tried to get blood work but was unsuccesful

## 2020-03-14 NOTE — ED Provider Notes (Signed)
[facility] DEPT Provider Note   CSN: [id number] Arrival date & time: 03/14/20  1223     History Chief Complaint  Patient presents with  . Flank Pain    Lauren Chapman is a 50 y.o. female.  The history is provided by the patient and medical records.  Flank Pain   Lauren Chapman is a 50 y.o. female who presents to the Emergency Department complaining of flank pain. She presents for sharp flank pain that started today.  Yesterday she was feeling unwell in general and today she developed abrupt onset right flank pain.  Pain in her right flank is described as sharp and she has dull pain in her lower back and epigastric region.  Today she developed hematuria.  Urine is described as bright red.  No clots.  No dysuria.  Hematuria is intermittent and overall decreasing.    She recently had an outpt MRI performed and incidentally was diagnosed with a right renal cyst.    Denies chest pain, sob, fever, vomiting.  Has nausea.    She is s/p hysterectomy, cholecystectomy.  Does not take hormones. No hx/o kidney stones.  No personal hx/o DVT/PE.    Family hx/o solitary kidney in uncles.      Past Medical History:  Diagnosis Date  . Anxiety   . Bipolar affective psychosis ([place])   . Complication of anesthesia    " Acting silly- waving at everyone"-Giddy  . Depression   . Emphysema lung (HCC)    inhaler prn,  followed by pcp and pulmonologist-- dr Margorie John  . Family history of adverse reaction to anesthesia    Father had rheumatic fever, and died on operating table.  Marland Kitchen GERD (gastroesophageal reflux disease)   . Migraine   . Smokers' cough ([place])    per pt not productive  . SUI (stress urinary incontinence, female)   . Wears glasses     Patient Active Problem List   Diagnosis Date Noted  . Left lumbar radiculopathy 02/25/2020  . Absence of bladder continence 02/25/2020  . History of postmenopausal bleeding 04/29/2019  . Polypharmacy 04/03/2019  .  Emphysema, unspecified ([place]) 05/17/2018  . Weakness 01/25/2017  . Chronic migraine w/o aura w/o status migrainosus, not intractable 12/22/2014  . Encounter for other general counseling or advice on contraception 09/02/2014  . Bipolar disorder ([place]) 10/12/2011  . Anxiety and depression 10/12/2011  . GERD (gastroesophageal reflux disease) 10/12/2011    Past Surgical History:  Procedure Laterality Date  . ABDOMINAL HYSTERECTOMY N/A    Phreesia 11/30/2019  . CARPAL TUNNEL RELEASE Right 09-12-2007   @[email or web address]    and GANGLION CYST EXCISION  . CYSTOSCOPY N/A 04/29/2019   Procedure: CYSTOSCOPY;  Surgeon: Joseph Pierini, MD;  [facility];  Service: Gynecology;  Laterality: N/A;  . DILATATION & CURETTAGE/HYSTEROSCOPY WITH MYOSURE N/A 11/13/2018   Procedure: [place];  Surgeon: Anastasio Auerbach, MD;  Location: [facility];  Service: Gynecology;  Laterality: N/A;  request 9:00am OR start time in [facility] block requests one hour  . DILATION AND CURETTAGE OF UTERUS  02/2019  . EYE SURGERY N/A    Phreesia 11/30/2019  . GANGLION CYST EXCISION     R wrist  . KNEE ARTHROSCOPY WITH ANTERIOR CRUCIATE LIGAMENT (ACL) REPAIR Right 10/2015  . LAPAROSCOPIC CHOLECYSTECTOMY  1997  . LAPAROSCOPIC HYSTERECTOMY Bilateral 04/29/2019   Procedure: HYSTERECTOMY TOTAL LAPAROSCOPIC, BILATERAL SALPINO-OOPHORECTOMY;  Surgeon: Joseph Pierini, MD;  Location: [facility];  Service: Gynecology;  Laterality: Bilateral;  . TOOTH EXTRACTION  04/01/2019     OB History    Gravida  3   Para  2   Term  2   Preterm  0   AB  1   Living  2     SAB  1   IAB  0   Ectopic  0   Multiple  0   Live Births              Family History  Problem Relation Age of Onset  . Depression Mother   . Cancer Mother        Cervical  . Alcohol abuse Father   . Crohn's disease Sister   . Stroke Brother 65  . Migraines  Daughter   . GER disease Daughter   . Depression Daughter   . Migraines Daughter   . GER disease Daughter     Social History   Tobacco Use  . Smoking status: Former Smoker    Packs/day: 0.50    Years: 33.00    Pack years: 16.50    Types: Cigarettes    Start date: 10/10/1981    Quit date: 11/10/2019    Years since quitting: 0.3  . Smokeless tobacco: Never Used  . Tobacco comment: 04/03/19   per pt down to 4-5 cig per day  Vaping Use  . Vaping Use: Never used  Substance Use Topics  . Alcohol use: Never    Alcohol/week: 0.0 standard drinks  . Drug use: Never    Home Medications Prior to Admission medications   Medication Sig Start Date End Date Taking? Authorizing Provider  cephALEXin (KEFLEX) 500 MG capsule Take 1 capsule (500 mg total) by mouth 3 (three) times daily. 03/14/20  Yes Quintella Reichert, MD  ondansetron (ZOFRAN ODT) 4 MG disintegrating tablet Take 1 tablet (4 mg total) by mouth every 8 (eight) hours as needed for nausea or vomiting. 03/14/20  Yes Quintella Reichert, MD  albuterol (VENTOLIN HFA) 108 (90 Base) MCG/ACT inhaler Inhale 2 puffs into the lungs every 4 (four) hours as needed for wheezing or shortness of breath. 02/20/20   Laurin Coder, MD  aspirin-acetaminophen-caffeine (EXCEDRIN MIGRAINE) [phone] MG tablet Take 2 tablets by mouth every 6 (six) hours as needed for headache.    [provider]  citalopram (CELEXA) 20 MG tablet Take 20 mg by mouth daily.    [provider]  clonazePAM (KLONOPIN) 0.5 MG tablet Take 0.5 mg by mouth 3 (three) times daily.  12/04/17   [provider]  esomeprazole (NEXIUM) 20 MG capsule Take 1 capsule (20 mg total) by mouth 2 (two) times daily before a meal. Patient taking differently: Take 20 mg by mouth 2 (two) times daily as needed (indigestion). 03/21/19   Daleen Squibb, MD  gabapentin (NEURONTIN) 800 MG tablet Take 800 mg by mouth 3 (three) times daily.  07/02/18   [provider]   Galcanezumab-gnlm (EMGALITY) 120 MG/ML SOAJ Inject 120 mg into the skin every 30 (thirty) days. 01/05/20   Suzzanne Cloud, NP  Glucosamine HCl (GLUCOSAMINE PO) Take 1 tablet by mouth daily as needed (arthritis).    [provider]  lamoTRIgine (LAMICTAL) 150 MG tablet Take 150 mg by mouth 2 (two) times daily.     [provider]  lurasidone (LATUDA) 40 MG TABS tablet Take 40 mg by mouth daily with breakfast.    [provider]  nicotine (NICODERM CQ - DOSED  IN MG/24 HOURS) 14 mg/24hr patch Place 1 patch (14 mg total) onto the skin daily. 09/16/19   Jacelyn Pi, Lilia Argue, MD  ondansetron (ZOFRAN) 4 MG tablet Take 1 tablet (4 mg total) by mouth every 8 (eight) hours as needed for nausea or vomiting. 01/05/20   Suzzanne Cloud, NP  rizatriptan (MAXALT) 10 MG tablet Take 1 tab at onset of migraine.  May repeat in 2 hrs, if needed.  Max dose: 2 tabs/day. This is a 30 day prescription. Patient taking differently: Take 1 tab at onset of migraine.  May repeat in 2 hrs, if needed.  Max dose: 2 tabs/day. This is a 30 day prescription. 01/05/20   Suzzanne Cloud, NP  tiZANidine (ZANAFLEX) 4 MG tablet Take 1 tablet (4 mg total) by mouth every 6 (six) hours as needed for muscle spasms. TAKE 1 TABLET BY MOUTH EVERY 6 HOURS AS NEEDED FOR MUSCLE SPASMS. 01/05/20   Suzzanne Cloud, NP    Allergies    Codeine  Review of Systems   Review of Systems  Genitourinary: Positive for flank pain.  All other systems reviewed and are negative.   Physical Exam Updated Vital Signs BP (!) 121/57 (BP Location: Right Arm)   Pulse 65   Temp 98.9 F (37.2 C) (Oral)   Resp 16   LMP 05/02/2019 Comment: irregular cycles  SpO2 96%   Physical Exam Vitals and nursing note reviewed.  Constitutional:      Appearance: She is well-developed and well-nourished.  HENT:     Head: Normocephalic and atraumatic.  Cardiovascular:     Rate and Rhythm: Normal rate and regular rhythm.     Heart sounds: No  murmur heard.   Pulmonary:     Effort: Pulmonary effort is normal. No respiratory distress.     Breath sounds: Normal breath sounds.  Abdominal:     Palpations: Abdomen is soft.     Tenderness: There is no abdominal tenderness. There is no guarding or rebound.  Musculoskeletal:        General: No swelling or edema.     Comments: 2+ DP pulses bilaterally.  Mild TTP over mid and lower lumbar back bilaterally, right greater than left.    Skin:    General: Skin is warm and dry.  Neurological:     Mental Status: She is alert and oriented to person, place, and time.  Psychiatric:        Mood and Affect: Mood and affect normal.        Behavior: Behavior normal.     ED Results / Procedures / Treatments   Labs (all labs ordered are listed, but only abnormal results are displayed) Labs Reviewed  URINALYSIS, ROUTINE W REFLEX MICROSCOPIC - Abnormal; Notable for the following components:      Result Value   APPearance CLOUDY (*)    Hgb urine dipstick LARGE (*)    Protein, ur 30 (*)    Nitrite POSITIVE (*)    Leukocytes,Ua LARGE (*)    RBC / HPF >50 (*)    WBC, UA >50 (*)    Bacteria, UA FEW (*)    All other components within normal limits  COMPREHENSIVE METABOLIC PANEL - Abnormal; Notable for the following components:   Creatinine, Ser 1.23 (*)    GFR, Estimated 53 (*)    All other components within normal limits  URINE CULTURE  CBC WITH DIFFERENTIAL/PLATELET    EKG None  Radiology CT Abdomen Pelvis W Contrast  Result  Date: 03/14/2020 CLINICAL DATA:  Hematuria unknown cause. EXAM: CT ABDOMEN AND PELVIS WITH CONTRAST TECHNIQUE: Multidetector CT imaging of the abdomen and pelvis was performed using the standard protocol following bolus administration of intravenous contrast. CONTRAST:  173mL OMNIPAQUE IOHEXOL 300 MG/ML  SOLN COMPARISON:  None. FINDINGS: Lower chest: The lung bases are clear. The heart size is normal. Hepatobiliary: There is probable underlying hepatic steatosis.  Status post cholecystectomy.There is no biliary ductal dilation. Pancreas: Normal contours without ductal dilatation. No peripancreatic fluid collection. Spleen: There is significant splenomegaly with the spleen measuring up to approximately 14 cm craniocaudad. Adrenals/Urinary Tract: --Adrenal glands: Normal --Right kidney/ureter: There is a simple appearing cyst arising from the right kidney. --Left kidney/ureter: No hydronephrosis or radiopaque kidney stones. --Urinary bladder: There is mild bladder wall thickening. Stomach/Bowel: --Stomach/Duodenum: No hiatal hernia or other gastric abnormality. Normal duodenal course and caliber. --Small bowel: Unremarkable. --Colon: There is mild focal wall thickening of the transverse colon (coronal series 5, image 33). This appears to improve on the delayed imaging and is felt to be secondary to peristalsis or underdistention. --Appendix: Normal. Vascular/Lymphatic: Normal course and caliber of the major abdominal vessels. --No retroperitoneal lymphadenopathy. --No mesenteric lymphadenopathy. --No pelvic or inguinal lymphadenopathy. Reproductive: Status post hysterectomy. No adnexal mass. Other: No ascites or free air. The abdominal wall is normal. Musculoskeletal. No acute displaced fractures. IMPRESSION: 1. Mild bladder wall thickening. Correlation with urinalysis is recommended. 2. No radiopaque kidney stones. No hydronephrosis. 3. Splenomegaly. 4. Probable underlying hepatic steatosis. Electronically Signed   By: Constance Holster M.D.   On: 03/14/2020 19:20    Procedures Procedures   Medications Ordered in ED Medications  cefTRIAXone (ROCEPHIN) 1 g in sodium chloride 0.9 % 100 mL IVPB (0 g Intravenous Stopped 03/14/20 1814)  iohexol (OMNIPAQUE) 300 MG/ML solution 100 mL (100 mLs Intravenous Contrast Given 03/14/20 1843)  morphine 4 MG/ML injection 4 mg (4 mg Intravenous Given 03/14/20 2030)    ED Course  I have reviewed the triage vital signs and the nursing  notes.  Pertinent labs & imaging results that were available during my care of the patient were reviewed by me and considered in my medical decision making (see chart for details).    MDM Rules/Calculators/A&P                         Patient recently diagnosed with incidental cyst on MRI of her lumbar spine here for evaluation of right flank pain and hematuria that started today. UA is consistent with UTI. CT was obtained given history of assisted hematuria. CT demonstrates right-sided renal cyst. No signs of hemorrhage or obstructing stone. No evidence of sepsis. Discussed with patient home care for UTI. Discussed outpatient follow-up and return precautions.  Final Clinical Impression(s) / ED Diagnoses Final diagnoses:  Acute UTI    Rx / DC Orders ED Discharge Orders         Ordered    cephALEXin (KEFLEX) 500 MG capsule  3 times daily        03/14/20 2022    ondansetron (ZOFRAN ODT) 4 MG disintegrating tablet  Every 8 hours PRN        03/14/20 2022           Quintella Reichert, MD 03/14/20 2235

## 2020-03-14 NOTE — ED Triage Notes (Signed)
Pt presents with c/o cyst that was found on her kidney by a recent MRI. Pt reports that today, she had a sharp pain around her lower right abdomen/lower right flank area and began to have large amount of blood in her urine. Pt reports she is concerned that the cyst may have ruptured.

## 2020-03-15 ENCOUNTER — Encounter: Payer: Self-pay | Admitting: Registered Nurse

## 2020-03-15 ENCOUNTER — Other Ambulatory Visit: Payer: Self-pay

## 2020-03-15 ENCOUNTER — Ambulatory Visit (INDEPENDENT_AMBULATORY_CARE_PROVIDER_SITE_OTHER): Payer: Medicare Other | Admitting: Registered Nurse

## 2020-03-15 ENCOUNTER — Encounter: Payer: Medicare Other | Admitting: Obstetrics and Gynecology

## 2020-03-15 VITALS — BP 104/72 | HR 74 | Temp 98.0°F | Resp 18 | Ht 62.5 in | Wt 177.6 lb

## 2020-03-15 DIAGNOSIS — N281 Cyst of kidney, acquired: Secondary | ICD-10-CM

## 2020-03-15 DIAGNOSIS — R159 Full incontinence of feces: Secondary | ICD-10-CM

## 2020-03-15 DIAGNOSIS — N3946 Mixed incontinence: Secondary | ICD-10-CM

## 2020-03-15 NOTE — Progress Notes (Signed)
Established Patient Office Visit  Subjective:  Patient ID: Lauren Chapman, female    DOB: September 22, 1970  Age: 50 y.o. MRN: [id number]  CC:  Chief Complaint  Patient presents with  . Follow-up    Patient states she is here for a follow up and discuss they cyst and bladder infection.    HPI Lauren Chapman presents for follow up   Neuro workup ongoing. MRI L spine and C spine showed some degenerative changes, showed cyst in sheath of c spine. Continuing to work with Dr. Krista Blue, who has ordered MRI head and T spine for continued workup. Unclear etiology of fecal incontinence.  Does note that fecal incontinence has continued. Happening daily. No new or distinct symptoms.  Was seen in ER yesterday for UTI. Had CT scan to rule out nephrolithiasis. Incidental finding of large renal cyst, 5.3cm, as well as colon thickening and enlarged spleen. Bmp did show mildly decreased renal function. Cbc wnl. Urine culture pending.  Pt wants to discuss these results and their implications for her care.    Past Medical History:  Diagnosis Date  . Anxiety   . Bipolar affective psychosis ([place])   . Complication of anesthesia    " Acting silly- waving at everyone"-Giddy  . Depression   . Emphysema lung (HCC)    inhaler prn,  followed by pcp and pulmonologist-- dr Margorie John  . Family history of adverse reaction to anesthesia    Father had rheumatic fever, and died on operating table.  Marland Kitchen GERD (gastroesophageal reflux disease)   . Migraine   . Smokers' cough ([place])    per pt not productive  . SUI (stress urinary incontinence, female)   . Wears glasses     Past Surgical History:  Procedure Laterality Date  . ABDOMINAL HYSTERECTOMY N/A    Phreesia 11/30/2019  . CARPAL TUNNEL RELEASE Right 09-12-2007   @[email or web address]    and GANGLION CYST EXCISION  . CYSTOSCOPY N/A 04/29/2019   Procedure: CYSTOSCOPY;  Surgeon: Joseph Pierini, MD;  Location[facility];  Service: Gynecology;  Laterality: N/A;  .  DILATATION & CURETTAGE/HYSTEROSCOPY WITH MYOSURE N/A 11/13/2018   Procedure: [place];  Surgeon: Anastasio Auerbach, MD;  Location: [place] [facility];  Service: Gynecology;  Laterality: N/A;  request 9:00am OR start time in [facility] block requests one hour  . DILATION AND CURETTAGE OF UTERUS  02/2019  . EYE SURGERY N/A    Phreesia 11/30/2019  . GANGLION CYST EXCISION     R wrist  . KNEE ARTHROSCOPY WITH ANTERIOR CRUCIATE LIGAMENT (ACL) REPAIR Right 10/2015  . LAPAROSCOPIC CHOLECYSTECTOMY  1997  . LAPAROSCOPIC HYSTERECTOMY Bilateral 04/29/2019   Procedure: HYSTERECTOMY TOTAL LAPAROSCOPIC, BILATERAL SALPINO-OOPHORECTOMY;  Surgeon: Joseph Pierini, MD;  Location: [facility];  Service: Gynecology;  Laterality: Bilateral;  . TOOTH EXTRACTION  04/01/2019    Family History  Problem Relation Age of Onset  . Depression Mother   . Cancer Mother        Cervical  . Alcohol abuse Father   . Crohn's disease Sister   . Stroke Brother 28  . Migraines Daughter   . GER disease Daughter   . Depression Daughter   . Migraines Daughter   . GER disease Daughter     Social History   Socioeconomic History  . Marital status: Divorced    Spouse name: n/a  . Number of children: 2  . Years of education: 29  . Highest education level: Not  on file  Occupational History  . Occupation: Disabled    Comment: Formerly a Pharmacist, hospital.  Tobacco Use  . Smoking status: Former Smoker    Packs/day: 0.50    Years: 33.00    Pack years: 16.50    Types: Cigarettes    Start date: 10/10/1981    Quit date: 11/10/2019    Years since quitting: 0.3  . Smokeless tobacco: Never Used  . Tobacco comment: 04/03/19   per pt down to 4-5 cig per day  Vaping Use  . Vaping Use: Never used  Substance and Sexual Activity  . Alcohol use: Never    Alcohol/week: 0.0 standard drinks  . Drug use: Never  . Sexual activity: Not on file    Comment: 1st intercourse  50 yo-More than 5 partners  Other Topics Concern  . Not on file  Social History Narrative   Lives at home with 1 of her two daughters   Right-handed.   2-4 cups caffeine daily.   Disabled    Social Determinants of Health   Financial Resource Strain: Not on file  Food Insecurity: Not on file  Transportation Needs: Not on file  Physical Activity: Not on file  Stress: Not on file  Social Connections: Not on file  Intimate Partner Violence: Not on file    Outpatient Medications Prior to Visit  Medication Sig Dispense Refill  . albuterol (VENTOLIN HFA) 108 (90 Base) MCG/ACT inhaler Inhale 2 puffs into the lungs every 4 (four) hours as needed for wheezing or shortness of breath. 1 each 1  . aspirin-acetaminophen-caffeine (EXCEDRIN MIGRAINE) 250-250-65 MG tablet Take 2 tablets by mouth every 6 (six) hours as needed for headache.    . cephALEXin (KEFLEX) 500 MG capsule Take 1 capsule (500 mg total) by mouth 3 (three) times daily. 21 capsule 0  . citalopram (CELEXA) 20 MG tablet Take 20 mg by mouth daily.    . clonazePAM (KLONOPIN) 0.5 MG tablet Take 0.5 mg by mouth 3 (three) times daily.   2  . esomeprazole (NEXIUM) 20 MG capsule Take 1 capsule (20 mg total) by mouth 2 (two) times daily before a meal. (Patient taking differently: Take 20 mg by mouth 2 (two) times daily as needed (indigestion).) 60 capsule 1  . gabapentin (NEURONTIN) 800 MG tablet Take 800 mg by mouth 3 (three) times daily.     . Galcanezumab-gnlm (EMGALITY) 120 MG/ML SOAJ Inject 120 mg into the skin every 30 (thirty) days. 1 mL 11  . Glucosamine HCl (GLUCOSAMINE PO) Take 1 tablet by mouth daily as needed (arthritis).    Marland Kitchen lamoTRIgine (LAMICTAL) 150 MG tablet Take 150 mg by mouth 2 (two) times daily.     Marland Kitchen lurasidone (LATUDA) 40 MG TABS tablet Take 40 mg by mouth daily with breakfast.    . nicotine (NICODERM CQ - DOSED IN MG/24 HOURS) 14 mg/24hr patch Place 1 patch (14 mg total) onto the skin daily. 28 patch 1  . ondansetron  (ZOFRAN ODT) 4 MG disintegrating tablet Take 1 tablet (4 mg total) by mouth every 8 (eight) hours as needed for nausea or vomiting. 8 tablet 0  . ondansetron (ZOFRAN) 4 MG tablet Take 1 tablet (4 mg total) by mouth every 8 (eight) hours as needed for nausea or vomiting. 20 tablet 6  . rizatriptan (MAXALT) 10 MG tablet Take 1 tab at onset of migraine.  May repeat in 2 hrs, if needed.  Max dose: 2 tabs/day. This is a 30 day prescription. (Patient taking differently:  Take 1 tab at onset of migraine.  May repeat in 2 hrs, if needed.  Max dose: 2 tabs/day. This is a 30 day prescription.) 12 tablet 6  . tiZANidine (ZANAFLEX) 4 MG tablet Take 1 tablet (4 mg total) by mouth every 6 (six) hours as needed for muscle spasms. TAKE 1 TABLET BY MOUTH EVERY 6 HOURS AS NEEDED FOR MUSCLE SPASMS. 30 tablet 6   No facility-administered medications prior to visit.    Allergies  Allergen Reactions  . Codeine Anaphylaxis    Anything with codeine    ROS Review of Systems  Constitutional: Negative.   HENT: Negative.   Eyes: Negative.   Respiratory: Negative.   Cardiovascular: Negative.   Gastrointestinal: Negative.   Genitourinary: Negative.   Musculoskeletal: Negative.   Skin: Negative.   Neurological: Negative.   Psychiatric/Behavioral: Negative.   All other systems reviewed and are negative.     Objective:    Physical Exam Vitals and nursing note reviewed.  Constitutional:      General: She is not in acute distress.    Appearance: Normal appearance. She is normal weight. She is not ill-appearing, toxic-appearing or diaphoretic.  Cardiovascular:     Rate and Rhythm: Normal rate and regular rhythm.     Heart sounds: Normal heart sounds. No murmur heard. No friction rub. No gallop.   Pulmonary:     Effort: Pulmonary effort is normal. No respiratory distress.     Breath sounds: Normal breath sounds. No stridor. No wheezing, rhonchi or rales.  Chest:     Chest wall: No tenderness.  Skin:     General: Skin is warm and dry.  Neurological:     General: No focal deficit present.     Mental Status: She is alert and oriented to person, place, and time. Mental status is at baseline.  Psychiatric:        Mood and Affect: Mood normal.        Behavior: Behavior normal.        Thought Content: Thought content normal.        Judgment: Judgment normal.     BP 104/72   Pulse 74   Temp 98 F (36.7 C) (Temporal)   Resp 18   Ht 5' 2.5" (1.588 m)   Wt 177 lb 9.6 oz (80.6 kg)   LMP 05/02/2019 Comment: irregular cycles  SpO2 97%   BMI 31.97 kg/m  Wt Readings from Last 3 Encounters:  03/15/20 177 lb 9.6 oz (80.6 kg)  02/25/20 185 lb (83.9 kg)  02/23/20 186 lb 6.4 oz (84.6 kg)     There are no preventive care reminders to display for this patient.  There are no preventive care reminders to display for this patient.  Lab Results  Component Value Date   TSH 0.86 09/09/2018   Lab Results  Component Value Date   WBC 9.1 03/14/2020   HGB 14.3 03/14/2020   HCT 42.5 03/14/2020   MCV 89.9 03/14/2020   PLT 204 03/14/2020   Lab Results  Component Value Date   NA 139 03/14/2020   K 3.7 03/14/2020   CO2 30 03/14/2020   GLUCOSE 96 03/14/2020   BUN 9 03/14/2020   CREATININE 1.23 (H) 03/14/2020   BILITOT 0.8 03/14/2020   ALKPHOS 85 03/14/2020   AST 18 03/14/2020   ALT 26 03/14/2020   PROT 7.5 03/14/2020   ALBUMIN 4.3 03/14/2020   CALCIUM 9.2 03/14/2020   ANIONGAP 10 03/14/2020   Lab Results  Component  Value Date   CHOL 179 10/11/2011   Lab Results  Component Value Date   HDL 50 10/11/2011   Lab Results  Component Value Date   LDLCALC 103 (H) 10/11/2011   Lab Results  Component Value Date   TRIG 130 10/11/2011   Lab Results  Component Value Date   CHOLHDL 3.6 10/11/2011   No results found for: HGBA1C    Assessment & Plan:   Problem List Items Addressed This Visit      Other   Absence of bladder continence   Relevant Orders   Ambulatory referral to  Urology    Other Visit Diagnoses    Renal cyst    -  Primary   Relevant Orders   Ambulatory referral to Nephrology   Full incontinence of feces       Relevant Orders   Ambulatory referral to Gastroenterology      No orders of the defined types were placed in this encounter.   Follow-up: No follow-ups on file.   PLAN  Discussed in depth with patient examination findings and implications. Renal cyst likely benign, decreased renal function likely transient and related to UTI. Will continue to monitor. Will refer to nephrology for follow up imaging and management.  Refer to GI for ongoing fecal incontinence. Favor neuro etiology given saddle anesthesia but will continue thorough workup given concerning symptoms.   Spleen enlargement likely transient, related to recent UTI possibly, cbc reassuring, will continue to monitor for changes. Unclear what her risk is for malignancy at this time, discussed symptoms that would be considered red flags in depth. Will continue to look for recent mammography results - ? Of abnormal result in chart, pt states Jan 2022 mammography wnl. Done at [facility]. Results not readily available in chart at this time   Patient encouraged to call clinic with any questions, comments, or concerns.  I spent 48 minutes with this patient, more than 50% of which was spent counseling and/or educating.  Maximiano Coss, NP

## 2020-03-15 NOTE — Patient Instructions (Signed)
° ° ° °  If you have lab work done today you will be contacted with your lab results within the next 2 weeks.  If you have not heard from us then please contact us. The fastest way to get your results is to register for My Chart. ° ° °IF you received an x-ray today, you will receive an invoice from [facility]. Please contact  Radiology at [phone] with questions or concerns regarding your invoice.  ° °IF you received labwork today, you will receive an invoice from [facility]. Please contact [facility] at [phone] with questions or concerns regarding your invoice.  ° °Our billing staff will not be able to assist you with questions regarding bills from these companies. ° °You will be contacted with the lab results as soon as they are available. The fastest way to get your results is to activate your My Chart account. Instructions are located on the last page of this paperwork. If you have not heard from us regarding the results in 2 weeks, please contact this office. °  ° ° ° °

## 2020-03-16 ENCOUNTER — Encounter: Payer: Self-pay | Admitting: Obstetrics and Gynecology

## 2020-03-16 ENCOUNTER — Ambulatory Visit (INDEPENDENT_AMBULATORY_CARE_PROVIDER_SITE_OTHER): Payer: Medicare Other | Admitting: Obstetrics and Gynecology

## 2020-03-16 ENCOUNTER — Ambulatory Visit
Admission: RE | Admit: 2020-03-16 | Discharge: 2020-03-16 | Disposition: A | Discharge status: Home / Self Care | Payer: Medicare Other | Source: Ambulatory Visit | Attending: Neurology | Admitting: Neurology

## 2020-03-16 VITALS — BP 118/74 | Ht 62.0 in | Wt 179.0 lb

## 2020-03-16 DIAGNOSIS — Z01419 Encounter for gynecological examination (general) (routine) without abnormal findings: Secondary | ICD-10-CM

## 2020-03-16 DIAGNOSIS — N39498 Other specified urinary incontinence: Secondary | ICD-10-CM

## 2020-03-16 NOTE — Progress Notes (Signed)
Lauren Chapman 03-12-1970 [id number]  SUBJECTIVE:  50 y.o. [id number] female for annual routine gynecologic exam. She has no gynecologic concerns.  Current Outpatient Medications  Medication Sig Dispense Refill  . albuterol (VENTOLIN HFA) 108 (90 Base) MCG/ACT inhaler Inhale 2 puffs into the lungs every 4 (four) hours as needed for wheezing or shortness of breath. 1 each 1  . aspirin-acetaminophen-caffeine (EXCEDRIN MIGRAINE) 250-250-65 MG tablet Take 2 tablets by mouth every 6 (six) hours as needed for headache.    . cephALEXin (KEFLEX) 500 MG capsule Take 1 capsule (500 mg total) by mouth 3 (three) times daily. 21 capsule 0  . citalopram (CELEXA) 20 MG tablet Take 20 mg by mouth daily.    . clonazePAM (KLONOPIN) 0.5 MG tablet Take 0.5 mg by mouth 3 (three) times daily.   2  . esomeprazole (NEXIUM) 20 MG capsule Take 1 capsule (20 mg total) by mouth 2 (two) times daily before a meal. (Patient taking differently: Take 20 mg by mouth 2 (two) times daily as needed (indigestion).) 60 capsule 1  . gabapentin (NEURONTIN) 800 MG tablet Take 800 mg by mouth 3 (three) times daily.     . Galcanezumab-gnlm (EMGALITY) 120 MG/ML SOAJ Inject 120 mg into the skin every 30 (thirty) days. 1 mL 11  . Glucosamine HCl (GLUCOSAMINE PO) Take 1 tablet by mouth daily as needed (arthritis).    Marland Kitchen lamoTRIgine (LAMICTAL) 150 MG tablet Take 150 mg by mouth 2 (two) times daily.     Marland Kitchen lurasidone (LATUDA) 40 MG TABS tablet Take 40 mg by mouth daily with breakfast.    . nicotine (NICODERM CQ - DOSED IN MG/24 HOURS) 14 mg/24hr patch Place 1 patch (14 mg total) onto the skin daily. 28 patch 1  . ondansetron (ZOFRAN ODT) 4 MG disintegrating tablet Take 1 tablet (4 mg total) by mouth every 8 (eight) hours as needed for nausea or vomiting. 8 tablet 0  . ondansetron (ZOFRAN) 4 MG tablet Take 1 tablet (4 mg total) by mouth every 8 (eight) hours as needed for nausea or vomiting. 20 tablet 6  . rizatriptan (MAXALT) 10 MG tablet Take 1  tab at onset of migraine.  May repeat in 2 hrs, if needed.  Max dose: 2 tabs/day. This is a 30 day prescription. (Patient taking differently: Take 1 tab at onset of migraine.  May repeat in 2 hrs, if needed.  Max dose: 2 tabs/day. This is a 30 day prescription.) 12 tablet 6  . tiZANidine (ZANAFLEX) 4 MG tablet Take 1 tablet (4 mg total) by mouth every 6 (six) hours as needed for muscle spasms. TAKE 1 TABLET BY MOUTH EVERY 6 HOURS AS NEEDED FOR MUSCLE SPASMS. 30 tablet 6   No current facility-administered medications for this visit.   Allergies: Codeine  Patient's last menstrual period was 05/02/2019.  Past medical history,surgical history, problem list, medications, allergies, family history and social history were all reviewed and documented as reviewed in the EPIC chart.  ROS: Pertinent positives and negatives as reviewed in HPI   OBJECTIVE:  BP 118/74 (BP Location: Right Arm, Patient Position: Sitting, Cuff Size: Normal)   Ht 5\' 2"  (1.575 m)   Wt 179 lb (81.2 kg)   LMP 05/02/2019 Comment: irregular cycles  BMI 32.74 kg/m  The patient appears well, alert, oriented, in no distress. ENT normal.  Neck supple. No cervical or supraclavicular adenopathy or thyromegaly.  Lungs are clear, good air entry, no wheezes, rhonchi or rales. S1 and S2 normal, no  murmurs, regular rate and rhythm.  Abdomen soft without tenderness, guarding, mass or organomegaly.  Neurological is normal, no focal findings.  BREAST EXAM: breasts appear normal, no suspicious masses, no skin or nipple changes or axillary nodes  PELVIC EXAM: VULVA: normal appearing vulva with atrophic change, no masses, tenderness or lesions, VAGINA: normal appearing vagina with atrophic change, normal color and discharge, no lesions, CERVIX: surgically absent, UTERUS: surgically absent, vaginal cuff normal, ADNEXA: nontender and no masses  Chaperone: Wandra Scot Bonham present during the examination  ASSESSMENT:  50 y.o. [id number] here for  annual gynecologic exam  PLAN:   1. Postmenopausal.  Prior TLH BSO in 2021 for persistent postmenopausal bleeding with benign pathology.  No significant hot flashes or night sweats.  No vaginal bleeding. 2. Pap smear/HPV 04/2017.  Reports remote history of abnormal Pap smears in her 20s and then normal since.  We will continue to consider the current screening guidelines and annual basis and she will decide if she desires to continue screening or not. 3. Mammogram 01/2020.  Normal breast exam today.  Will continue with annual mammography. 4. Colon cancer screening.  She is doing cologuard through her primary doctor and will continue to follow-up there in regard to screening recommendations. 5. DEXA never.  Plan when further into menopause. 6. Health maintenance.  No labs today as she normally has these completed elsewhere.  The patient is aware that I will only be at this practice until early March 2022 so she knows to make sure she requests follow-up on any results if any testing is completed when I am no longer at the practice.  Return annually or sooner, prn.  Joseph Pierini MD 03/16/20

## 2020-03-17 LAB — URINE CULTURE: Culture: >=100000 — AB

## 2020-03-18 ENCOUNTER — Telehealth: Payer: Self-pay | Admitting: Emergency Medicine

## 2020-03-18 NOTE — Telephone Encounter (Signed)
Post ED Visit - Positive Culture Follow-up  Culture report reviewed by antimicrobial stewardship pharmacist: [place] Team []  Elenor Quinones, Pharm.D. []  Heide Guile, Pharm.D., BCPS AQ-ID []  Parks Neptune, Pharm.D., BCPS []  Alycia Rossetti, Pharm.D., BCPS []  [place], Pharm.D., BCPS, AAHIVP []  Legrand Como, Pharm.D., BCPS, AAHIVP []  Salome Arnt, PharmD, BCPS []  Johnnette Gourd, PharmD, BCPS []  Hughes Better, PharmD, BCPS []  Leeroy Cha, PharmD []  Laqueta Linden, PharmD, BCPS []  Albertina Parr, PharmD  [place] Team []  Leodis Sias, PharmD []  Lindell Spar, PharmD []  Royetta Asal, PharmD []  Graylin Shiver, Rph []  Rema Fendt) Glennon Mac, PharmD []  Arlyn Dunning, PharmD []  Netta Cedars, PharmD []  Dia Sitter, PharmD []  Leone Haven, PharmD []  Gretta Arab, PharmD []  Theodis Shove, PharmD []  Peggyann Juba, PharmD []  Reuel Boom, PharmD   Positive urine culture Treated with cephalexin, organism sensitive to the same and no further patient follow-up is required at this time.  Hazle Nordmann 03/18/2020, 9:32 AM

## 2020-03-22 ENCOUNTER — Telehealth: Payer: Self-pay

## 2020-03-22 NOTE — Telephone Encounter (Signed)
I have spoken to the pt and she stated that she will see the nephrologist about her renal cyst and she wanted to know who she sees for the cyst on her spine. I have informed her that the Neurologist will see for that, if not then since they dx it they may be the one to send the referral if needed. Pt stated understanding.

## 2020-03-22 NOTE — Telephone Encounter (Signed)
Pt. Called reporting she had been in contact with the nephrology office and that they didn't see patients for her issue. Pt. Was unable to specify exact reasoning.

## 2020-03-22 NOTE — Telephone Encounter (Signed)
Patient recently completed MRI scans of her brain, cervical, thoracic and lumbar. She is requesting an office visit to review the result in greater detail with Dr. Krista Blue. Appt scheduled on 03/24/20.

## 2020-03-22 NOTE — Telephone Encounter (Signed)
Pt called, my mother and I would like to speak with Dr. Krista Blue about my MRI results. I really do not understand the medical words. Would like a call from the nurse.

## 2020-03-23 ENCOUNTER — Encounter: Payer: Self-pay | Admitting: Registered Nurse

## 2020-03-23 NOTE — Telephone Encounter (Signed)
Pt had a CT of the abdomen showing enlarged spleen and liver requesting referral to specialist please advise or order. Thank you

## 2020-03-24 ENCOUNTER — Ambulatory Visit (INDEPENDENT_AMBULATORY_CARE_PROVIDER_SITE_OTHER): Payer: Medicare Other | Admitting: Neurology

## 2020-03-24 ENCOUNTER — Other Ambulatory Visit: Payer: Self-pay | Admitting: *Deleted

## 2020-03-24 ENCOUNTER — Encounter: Payer: Self-pay | Admitting: Neurology

## 2020-03-24 ENCOUNTER — Other Ambulatory Visit: Payer: Self-pay

## 2020-03-24 VITALS — BP 114/76 | HR 59 | Ht 62.0 in | Wt 179.0 lb

## 2020-03-24 DIAGNOSIS — G43709 Chronic migraine without aura, not intractable, without status migrainosus: Secondary | ICD-10-CM | POA: Diagnosis not present

## 2020-03-24 DIAGNOSIS — F32A Depression, unspecified: Secondary | ICD-10-CM

## 2020-03-24 DIAGNOSIS — M5442 Lumbago with sciatica, left side: Secondary | ICD-10-CM

## 2020-03-24 MED ORDER — EMGALITY 120 MG/ML ~~LOC~~ SOAJ: 120.0000 mg | SUBCUTANEOUS | 3 refills | Status: DC

## 2020-03-24 NOTE — Progress Notes (Signed)
HISTORY OF PRESENT ILLNESS: Lauren Chapman a 50 year old right-handed female, seen in refer by her primary care Lauren Costa, NP for evaluation of migraine in October 19 2014  I reviewed and summarized her office visit, reviewed laboratory in July 2016, mild elevated triglycerides 208,  She has a history of bipolar affective disorder, is on polypharmacy treatment, this including lamotrigine, Zoloft, Brintellix, Valium, Cogentin, gabapentin.  She started to have migraine since 50 years old, her typical migraine are lateralized severe pounding headache was associated light noise sensitivity, lasting 1-2 days, she often has alternating of sound, visual disturbance before the onset of migraine, and during severe headaches. over the years, she has had at least twice migraine each week, she has been taking frequent goody powders, which does not work anymore, now she take over-the-counter ibuprofen and Aleve, up to 10 tablets each day to try to get rid of headache with suboptimal response. She was giving a prescription of Imitrex upon why ER presentation for her migraines, which does not work well, she reported loose control of her bowel movements after taking Imitrex.   She was not able to identify trigger for her migraine, she does couple days each week because of her severe headaches, she has never tried any preventive medications in the past  For migraine prevention, she has tried Inderal, Botox injection by rehabilitation physician Dr. Posey Pronto in January 20 17-2019, which was helpful,  She reported taking Excedrin Migraine about every other day for mild to moderate headaches  Then she began to have frequent headaches again since early 2021,While she was still on still on polypharmacy for her bipolar disorder, Zoloft 200 mg every day, Remeron 15 mg at bedtime, lamotrigine 150 mg twice a day, gabapentin 800 mg 3 times a day, clonazepam 0.5 mg 3 times a day, Vraylar 4.5 mg at  bedtime  She complains of migraine on a daily basis in early 2021, taking multiple doses of Excedrin Migraine, was started on Ajovy since summer 2021, eventually was switched to [employer] due to insurance preference, her migraine was under much better control,  Update February 25, 2020: Lauren Chapman came in today with new complaints of chronic low back pain, has been ongoing for 3 years, getting worse since 2021, now she complains of constant left low back pain, radiating pain to left lower extremity, dragging her left leg in the morning  In addition, she also complains of slow worsening bowel and bladder incontinence, especially since April 2022, she stated sometimes she sitting there without knowing she has wet herself, even soiled herself sometimes, accident happened on a daily basis, she is afraid to eat or go out because of that  EMG nerve conduction study in 2019 was normal, there was no evidence of cervical radiculopathy or lumbar radiculopathy  UPDATE March 24 2020: She is accompanied by her mother at today's clinical visit, reported excessive stress, her daughter along with her grand daughter moved in with her, caused a lot of stress, change of her routine  She continue complains of headache, migraine about 1-2 times each week, she has stopped the frequent Excedrin Migraine over-the-counter medication use, Maxalt along with tizanidine, Zofran works well for her headache, sleep always helps her headache  She continue complains of depression, under suboptimal control, also reported intermittent bowel and bladder incontinence  For that reason, we have initiated extensive evaluations, personally reviewed MRI of brain in February 2022, no acute abnormality, few scattered supratentorium small vessel disease  MRI of cervical spine  mild degenerative changes, no evidence of canal or foraminal narrowing, T1-2, left or nerve sheath cyst, no spinal stenosis or foraminal narrowing  MRI of thoracic spine,  essentially normal MRI of lumbar spine unremarkable, incidental large right renal cyst 5.3 cm  CT of abdomen pelvic with contrast, simple appearing cyst from right kidney, Laboratory evaluations, normal CBC hemoglobin 14.3, CMP showed elevated creatinine 1.23, urinalysis showed WBC more than 50, positive nitrite, large leukocyte, cloudy, culture showed more than 100,000 E. coli, she was treated for UTI recently, which can cause urinary urgency, even urinary incontinence   REVIEW OF SYSTEMS: Out of a complete 14 system review of symptoms, the patient complains only of the following symptoms, and all other reviewed systems are negative.  Headache   ALLERGIES: Allergies  Allergen Reactions  . Codeine Anaphylaxis    Anything with codeine    HOME MEDICATIONS: Outpatient Medications Prior to Visit  Medication Sig Dispense Refill  . albuterol (VENTOLIN HFA) 108 (90 Base) MCG/ACT inhaler Inhale 2 puffs into the lungs every 4 (four) hours as needed for wheezing or shortness of breath. 1 each 1  . aspirin-acetaminophen-caffeine (EXCEDRIN MIGRAINE) 250-250-65 MG tablet Take 2 tablets by mouth every 6 (six) hours as needed for headache.    . citalopram (CELEXA) 20 MG tablet Take 20 mg by mouth daily.    . clonazePAM (KLONOPIN) 0.5 MG tablet Take 0.5 mg by mouth 3 (three) times daily.   2  . esomeprazole (NEXIUM) 20 MG capsule Take 1 capsule (20 mg total) by mouth 2 (two) times daily before a meal. (Patient taking differently: Take 20 mg by mouth 2 (two) times daily as needed (indigestion).) 60 capsule 1  . gabapentin (NEURONTIN) 800 MG tablet Take 800 mg by mouth 3 (three) times daily.     . Galcanezumab-gnlm (EMGALITY) 120 MG/ML SOAJ Inject 120 mg into the skin every 30 (thirty) days. 3 mL 3  . Glucosamine HCl (GLUCOSAMINE PO) Take 1 tablet by mouth daily as needed (arthritis).    Marland Kitchen lamoTRIgine (LAMICTAL) 150 MG tablet Take 150 mg by mouth 2 (two) times daily.     Marland Kitchen lurasidone (LATUDA) 40 MG TABS  tablet Take 40 mg by mouth daily with breakfast.    . nicotine (NICODERM CQ - DOSED IN MG/24 HOURS) 14 mg/24hr patch Place 1 patch (14 mg total) onto the skin daily. 28 patch 1  . ondansetron (ZOFRAN) 4 MG tablet Take 1 tablet (4 mg total) by mouth every 8 (eight) hours as needed for nausea or vomiting. 20 tablet 6  . rizatriptan (MAXALT) 10 MG tablet Take 1 tab at onset of migraine.  May repeat in 2 hrs, if needed.  Max dose: 2 tabs/day. This is a 30 day prescription. (Patient taking differently: Take 1 tab at onset of migraine.  May repeat in 2 hrs, if needed.  Max dose: 2 tabs/day. This is a 30 day prescription.) 12 tablet 6  . tiZANidine (ZANAFLEX) 4 MG tablet Take 1 tablet (4 mg total) by mouth every 6 (six) hours as needed for muscle spasms. TAKE 1 TABLET BY MOUTH EVERY 6 HOURS AS NEEDED FOR MUSCLE SPASMS. 30 tablet 6  . cephALEXin (KEFLEX) 500 MG capsule Take 1 capsule (500 mg total) by mouth 3 (three) times daily. 21 capsule 0  . Galcanezumab-gnlm (EMGALITY) 120 MG/ML SOAJ Inject 120 mg into the skin every 30 (thirty) days. 1 mL 11  . ondansetron (ZOFRAN ODT) 4 MG disintegrating tablet Take 1 tablet (4 mg total) by  mouth every 8 (eight) hours as needed for nausea or vomiting. 8 tablet 0   No facility-administered medications prior to visit.    PAST MEDICAL HISTORY: Past Medical History:  Diagnosis Date  . Anxiety   . Bipolar affective psychosis ([place])   . Complication of anesthesia    " Acting silly- waving at everyone"-Giddy  . Depression   . Emphysema lung ([facility])    inhaler prn,  followed by pcp and pulmonologist-- dr Margorie John  . Family history of adverse reaction to anesthesia    Father had rheumatic fever, and died on operating table.  Marland Kitchen GERD (gastroesophageal reflux disease)   . Migraine   . Smokers' cough ([place])    per pt not productive  . SUI (stress urinary incontinence, female)   . Wears glasses     PAST SURGICAL HISTORY: Past Surgical History:  Procedure Laterality  Date  . ABDOMINAL HYSTERECTOMY N/A    [facility]rees[facility] 11/30/2019  . CARPAL TUNNEL RELEASE Right 09-12-2007   @[email or web address]    and GANGLION CYST EXCISION  . CYSTOSCOPY N/A 04/29/2019   Procedure: CYSTOSCOPY;  Surgeon: Joseph Pierini, MD;  [facility];  Service: Gynecology;  Laterality: N/A;  . DILATATION & CURETTAGE/HYSTEROSCOPY WITH MYOSURE N/A 11/13/2018   Procedure: [place];  Surgeon: Anastasio Auerbach, MD;  Location: [facility];  Service: Gynecology;  Laterality: N/A;  request 9:00am OR start time in [place] Gyn block requests one hour  . DILATION AND CURETTAGE OF UTERUS  02/2019  . EYE SURGERY N/A    [facility]reesia 11/30/2019  . GANGLION CYST EXCISION     R wrist  . KNEE ARTHROSCOPY WITH ANTERIOR CRUCIATE LIGAMENT (ACL) REPAIR Right 10/2015  . LAPAROSCOPIC CHOLECYSTECTOMY  1997  . LAPAROSCOPIC HYSTERECTOMY Bilateral 04/29/2019   Procedure: HYSTERECTOMY TOTAL LAPAROSCOPIC, BILATERAL SALPINO-OOPHORECTOMY;  Surgeon: Joseph Pierini, MD;  Location: [facility];  Service: Gynecology;  Laterality: Bilateral;  . TOOTH EXTRACTION  04/01/2019    FAMILY HISTORY: Family History  Problem Relation Age of Onset  . Depression Mother   . Cancer Mother        Cervical  . Alcohol abuse Father   . Crohn's disease Sister   . Stroke Brother 61  . Migraines Daughter   . GER disease Daughter   . Depression Daughter   . Migraines Daughter   . GER disease Daughter     SOCIAL HISTORY: Social History   Socioeconomic History  . Marital status: Divorced    Spouse name: n/a  . Number of children: 2  . Years of education: 78  . Highest education level: Not on file  Occupational History  . Occupation: Disabled    Comment: Formerly a Pharmacist, hospital.  Tobacco Use  . Smoking status: Former Smoker    Packs/day: 0.50    Years: 33.00    Pack years: 16.50    Types: Cigarettes    Start date: 10/10/1981    Quit date:  11/10/2019    Years since quitting: 0.3  . Smokeless tobacco: Never Used  . Tobacco comment: 04/03/19   per pt down to 4-5 cig per day  Vaping Use  . Vaping Use: Never used  Substance and Sexual Activity  . Alcohol use: Never    Alcohol/week: 0.0 standard drinks  . Drug use: Never  . Sexual activity: Not Currently    Partners: Male    Birth control/protection: Post-menopausal    Comment: 1st intercourse 50 yo-More than 5 partners  Other Topics  Concern  . Not on file  Social History Narrative   Lives at home with 1 of her two daughters   Right-handed.   2-4 cups caffeine daily.   Disabled    Social Determinants of Health   Financial Resource Strain: Not on file  Food Insecurity: Not on file  Transportation Needs: Not on file  Physical Activity: Not on file  Stress: Not on file  Social Connections: Not on file  Intimate Partner Violence: Not on file   PHYSICAL EXAM  Vitals:   03/24/20 0802  BP: 114/76  Pulse: (!) 59  Weight: 179 lb (81.2 kg)  Height: 5\' 2"  (1.575 m)   Body mass index is 32.74 kg/m.   PHYSICAL EXAMNIATION:  Gen: NAD, conversant, well nourised, well groomed        NEUROLOGICAL EXAM:  MENTAL STATUS: Speech cognition: Awake, alert, oriented history taking and casual conversation   CRANIAL NERVES: CN II: Visual fields are full to confrontation.  Pupils are round equal and briskly reactive to light. CN III, IV, VI: extraocular movement are normal. No ptosis. CN V: Facial sensation is intact to pinprick in all 3 divisions bilaterally. Corneal responses are intact.  CN VII: Face is symmetric with normal eye closure and smile. CN VIII: Hearing is normal to casual conversation CN IX, X: Palate elevates symmetrically. Phonation is normal. CN XI: Head turning and shoulder shrug are intact   MOTOR: Variable effort on motor examination, felt there was no significant bilateral upper or lower extremity proximal and distal muscle  weakness,  REFLEXES: Reflexes are 2+ and symmetric at the biceps, triceps, knees, and ankles. Plantar responses are flexor.  SENSORY: Intact to light touch, pinprick, positional and vibratory sensation are intact in fingers and toes.  COORDINATION: Rapid alternating movements and fine finger movements are intact. There is no dysmetria on finger-to-nose and heel-knee-shin.    GAIT/STANCE: She can get up from seated position, arms crossed, steady, mild difficulty with tandem, tiptoe and heel walking, but able to recover her balance well from an awkward position   DIAGNOSTIC DATA (LABS, IMAGING, TESTING) - I reviewed patient records, labs, notes, testing and imaging myself where available.  Lab Results  Component Value Date   WBC 9.1 03/14/2020   HGB 14.3 03/14/2020   HCT 42.5 03/14/2020   MCV 89.9 03/14/2020   PLT 204 03/14/2020      Component Value Date/Time   NA 139 03/14/2020 1700   NA 143 03/24/2019 1510   K 3.7 03/14/2020 1700   CL 99 03/14/2020 1700   CO2 30 03/14/2020 1700   GLUCOSE 96 03/14/2020 1700   BUN 9 03/14/2020 1700   BUN 10 03/24/2019 1510   CREATININE 1.23 (H) 03/14/2020 1700   CREATININE 1.12 (H) 08/17/2015 1608   CALCIUM 9.2 03/14/2020 1700   PROT 7.5 03/14/2020 1700   PROT 6.9 03/24/2019 1510   ALBUMIN 4.3 03/14/2020 1700   ALBUMIN 4.4 03/24/2019 1510   AST 18 03/14/2020 1700   ALT 26 03/14/2020 1700   ALKPHOS 85 03/14/2020 1700   BILITOT 0.8 03/14/2020 1700   BILITOT <0.2 03/24/2019 1510   GFRNONAA 53 (L) 03/14/2020 1700   GFRAA 71 03/24/2019 1510   Lab Results  Component Value Date   CHOL 179 10/11/2011   HDL 50 10/11/2011   LDLCALC 103 (H) 10/11/2011   TRIG 130 10/11/2011   CHOLHDL 3.6 10/11/2011   No results found for: HGBA1C No results found for: [id number] Lab Results  Component Value Date  TSH 0.86 09/09/2018    ASSESSMENT AND PLAN 50 y.o. year old female    Chronic migraine headache  Much improved since Emgality , well  controlled with Maxalt  MRI of the brain in January 2019 was normal  Suboptimal control of depression anxiety, social stress at home  Continue working with her psychiatrist,  Her mood disorder likely contributed a lot to her current complaints  Reported history of bowel bladder incontinence  Extensive imaging study failed to demonstrate etiology,  She was recently treated for E. coli UTI, which can certainly contributed to her reported urinary urgency, urinary incontinence,   Spent lengthy time explaining the findings to patient's and her mother, her mother seems to have good understanding of the situation, patient will follow with her primary care physician, and psychiatrist, return to clinic for new issues  Total time spent reviewing the chart, obtaining history, examined patient, ordering tests, documentation, consultations and family, care coordination was 43 minutes       Marcial Pacas, M.D. Ph.D.  [facility] [address], [place] [address] Phone: [phone] Fax:      [phone]

## 2020-03-25 ENCOUNTER — Ambulatory Visit: Payer: Self-pay | Admitting: Neurology

## 2020-03-29 DIAGNOSIS — R7989 Other specified abnormal findings of blood chemistry: Secondary | ICD-10-CM | POA: Diagnosis not present

## 2020-03-29 DIAGNOSIS — F17200 Nicotine dependence, unspecified, uncomplicated: Secondary | ICD-10-CM | POA: Diagnosis not present

## 2020-03-29 DIAGNOSIS — I129 Hypertensive chronic kidney disease with stage 1 through stage 4 chronic kidney disease, or unspecified chronic kidney disease: Secondary | ICD-10-CM | POA: Diagnosis not present

## 2020-03-29 DIAGNOSIS — N281 Cyst of kidney, acquired: Secondary | ICD-10-CM | POA: Diagnosis not present

## 2020-03-31 DIAGNOSIS — H4423 Degenerative myopia, bilateral: Secondary | ICD-10-CM | POA: Diagnosis not present

## 2020-03-31 DIAGNOSIS — H04123 Dry eye syndrome of bilateral lacrimal glands: Secondary | ICD-10-CM | POA: Diagnosis not present

## 2020-03-31 DIAGNOSIS — H43813 Vitreous degeneration, bilateral: Secondary | ICD-10-CM | POA: Diagnosis not present

## 2020-03-31 DIAGNOSIS — Z961 Presence of intraocular lens: Secondary | ICD-10-CM | POA: Diagnosis not present

## 2020-04-06 ENCOUNTER — Ambulatory Visit: Payer: Medicare Other | Admitting: Nurse Practitioner

## 2020-04-07 ENCOUNTER — Ambulatory Visit: Payer: Medicare Other

## 2020-04-27 DIAGNOSIS — R3915 Urgency of urination: Secondary | ICD-10-CM | POA: Diagnosis not present

## 2020-04-27 DIAGNOSIS — R350 Frequency of micturition: Secondary | ICD-10-CM | POA: Diagnosis not present

## 2020-05-20 ENCOUNTER — Encounter: Payer: Self-pay | Admitting: Nurse Practitioner

## 2020-05-20 ENCOUNTER — Ambulatory Visit (INDEPENDENT_AMBULATORY_CARE_PROVIDER_SITE_OTHER): Payer: Medicare Other | Admitting: Nurse Practitioner

## 2020-05-20 VITALS — BP 100/74 | HR 81 | Ht 62.0 in | Wt 191.0 lb

## 2020-05-20 DIAGNOSIS — K625 Hemorrhage of anus and rectum: Secondary | ICD-10-CM

## 2020-05-20 DIAGNOSIS — R159 Full incontinence of feces: Secondary | ICD-10-CM | POA: Diagnosis not present

## 2020-05-20 DIAGNOSIS — R161 Splenomegaly, not elsewhere classified: Secondary | ICD-10-CM

## 2020-05-20 MED ORDER — SUTAB 1479-225-188 MG PO TABS: 1.0000 | ORAL_TABLET | Freq: Once | ORAL | 0 refills | Status: AC

## 2020-05-20 NOTE — Progress Notes (Signed)
ASSESSMENT AND PLAN    # 50 yo female with progressive fecal incontinence over last two years. Now completely incontinent of stool. Seems to have adequate sphincter tone on exam but diminished pelvic floor descent. Also with progressive urinary incontinence.  Neurologic work-up including MRIs have been unremarkable.  She is being evaluated by Urology and is to start pelvic floor physical therapy in June.  Unclear why she is totally incontinent of urine and stool, seems out of proportion to pelvic floor weakness. No structural abnormalities of pelvis on recent CT scan.  # Rectal bleeding x 6 months. Possibly hemorrhoidal bleeding.  --Negative Cologuard in April 2021. In light of rectal bleeding over last 6 months, progressive incontinence and also and transverse colon CT scan findings ( see below) will proceed with colonoscopy. The risks and benefits of colonoscopy with possible polypectomy / biopsies were discussed and the patient agrees to proceed.   # Abnormal colon on CT scan in Feb 2022. Mild focal wall thickening of ftranserse colon, improved on delayed imaging and felt to be secondary to peristalsis or underdistention -- Further evaluation at time of colonoscopy  # Splenomegaly ( CT scan). Normal platelet count. Hepatic steatosis, no evidence for cirrhosis on imaging. Etiology ?  # ? CKD     HISTORY OF PRESENT ILLNESS     Chief Complaint : Fecal incontinence  Lauren Chapman is a 50 y.o. female with a past medical history significant for migraines, anxiety, depression, migraines , hysterectomy, cholecystectomy. See additional PMH below.   Patient is new to the practice, referred by PCP for evaluation of fecal incontinence  Patient gives a history of bowel problems starting two years ago. Initally she felt the urge to defecate but couldn't always make it to the bathroom in time. Over the last 6 months she has lost all urges and is completely incontinent of stool.  Also over the  last 6 months she has had daily rectal bleeding with wiping. She feels like the bleeding is related to hemorrhoids. She sometimes gets pain rectal pain.  No Hamberg of colon cancer.   She had a negative Cologuard in April 2021  Also, patient has been having problems with urinary incontinence which started as stress incontinence after childbirth.  A year ago she stopped getting any sensation to urinate. She saw Urology and was started on Detrol. They discussed some sort of inplant ( nerve stimulator ?) and she is to see a pelvic floor therapist in June. She has been evaluated by Neurology. She had an MRI of cerviical, thoracic and lumbar spine as well as brain MRI and told everything was okay. She denies spinal injury.  No lower extremity weakness.  She does have leg pain and takes Neurontin.   In February 2022 she was seen in ED for flank pain and hematuria.  CT scan w/ contrast demonstrated focal mild wall thickening of transerse colon, improved on delayed imaging and felt to be secondary to peristalsis or underdistention.  Significant splenomegaly.  Mild bladder wall thickening,  probable hepatic steatosis.   Data Reviewed: 02/2020 CBC normal Liver chemistries normal.  Cr 1.23, GFR 53  03/16/20 CT scan w/ contrast in ED CT scan w/ contrast for evaluation of hematuria. Findings included focal mild wall thickening of transerse colon, improved on delayed imaging and fel to be secondary to peristalsis or underdistention.  Significant splenomegaly.  Mild bladder wall thickening probable hepatic steatosis   Past Medical History:  Diagnosis Date  . Anxiety   .  Bipolar affective psychosis ([place])   . Complication of anesthesia    " Acting silly- waving at everyone"-Giddy  . Depression   . Emphysema lung ([facility])    inhaler prn,  followed by pcp and pulmonologist-- dr Margorie John  . Family history of adverse reaction to anesthesia    Father had rheumatic fever, and died on operating table.  Marland Kitchen GERD  (gastroesophageal reflux disease)   . Migraine   . Smokers' cough ([place])    per pt not productive  . SUI (stress urinary incontinence, female)   . Wears glasses      Past Surgical History:  Procedure Laterality Date  . ABDOMINAL HYSTERECTOMY N/A    Phreesia 11/30/2019  . CARPAL TUNNEL RELEASE Right 09-12-2007   @[facility]    and GANGLION CYST EXCISION  . CYSTOSCOPY N/A 04/29/2019   Procedure: CYSTOSCOPY;  Surgeon: Joseph Pierini, MD;  Location: [facility];  Service: Gynecology;  Laterality: N/A;  . DILATATION & CURETTAGE/HYSTEROSCOPY WITH MYOSURE N/A 11/13/2018   Procedure: [place];  Surgeon: Anastasio Auerbach, MD;  Location: [place] [facility];  Service: Gynecology;  Laterality: N/A;  request 9:00am OR start time in [place] [facility] block requests one hour  . DILATION AND CURETTAGE OF UTERUS  02/2019  . EYE SURGERY N/A    Phreesia 11/30/2019  . GANGLION CYST EXCISION     R wrist  . KNEE ARTHROSCOPY WITH ANTERIOR CRUCIATE LIGAMENT (ACL) REPAIR Right 10/2015  . LAPAROSCOPIC CHOLECYSTECTOMY  1997  . LAPAROSCOPIC HYSTERECTOMY Bilateral 04/29/2019   Procedure: HYSTERECTOMY TOTAL LAPAROSCOPIC, BILATERAL SALPINO-OOPHORECTOMY;  Surgeon: Joseph Pierini, MD;  Location: [facility];  Service: Gynecology;  Laterality: Bilateral;  . TOOTH EXTRACTION  04/01/2019   Family History  Problem Relation Age of Onset  . Depression Mother   . Cancer Mother        Cervical  . Alcohol abuse Father   . Crohn's disease Sister   . Stroke Brother 84  . Migraines Daughter   . GER disease Daughter   . Depression Daughter   . Migraines Daughter   . GER disease Daughter    Social History   Tobacco Use  . Smoking status: Current Every Day Smoker    Packs/day: 0.50    Years: 33.00    Pack years: 16.50    Types: Cigarettes    Start date: 10/10/1981    Last attempt to quit: 11/10/2019    Years since quitting: 0.5  . Smokeless  tobacco: Never Used  Vaping Use  . Vaping Use: Never used  Substance Use Topics  . Alcohol use: Never    Alcohol/week: 0.0 standard drinks  . Drug use: Never   Current Outpatient Medications  Medication Sig Dispense Refill  . albuterol (VENTOLIN HFA) 108 (90 Base) MCG/ACT inhaler Inhale 2 puffs into the lungs every 4 (four) hours as needed for wheezing or shortness of breath. 1 each 1  . aspirin-acetaminophen-caffeine (EXCEDRIN MIGRAINE) 250-250-65 MG tablet Take 2 tablets by mouth every 6 (six) hours as needed for headache.    . citalopram (CELEXA) 20 MG tablet Take 20 mg by mouth daily.    . clonazePAM (KLONOPIN) 0.5 MG tablet Take 0.5 mg by mouth 2 (two) times daily.  2  . esomeprazole (NEXIUM) 20 MG capsule Take 1 capsule (20 mg total) by mouth 2 (two) times daily before a meal. (Patient taking differently: Take 20 mg by mouth 2 (two) times daily as needed (indigestion).) 60 capsule 1  .  gabapentin (NEURONTIN) 800 MG tablet Take 800 mg by mouth 3 (three) times daily.     . Galcanezumab-gnlm (EMGALITY) 120 MG/ML SOAJ Inject 120 mg into the skin every 30 (thirty) days. 3 mL 3  . Glucosamine HCl (GLUCOSAMINE PO) Take 1 tablet by mouth daily as needed (arthritis).    . hydrOXYzine (VISTARIL) 25 MG capsule Take 25 mg by mouth as needed.    . lamoTRIgine (LAMICTAL) 150 MG tablet Take 150 mg by mouth 2 (two) times daily.     . ondansetron (ZOFRAN) 4 MG tablet Take 1 tablet (4 mg total) by mouth every 8 (eight) hours as needed for nausea or vomiting. 20 tablet 6  . rizatriptan (MAXALT) 10 MG tablet Take 1 tab at onset of migraine.  May repeat in 2 hrs, if needed.  Max dose: 2 tabs/day. This is a 30 day prescription. (Patient taking differently: Take 1 tab at onset of migraine.  May repeat in 2 hrs, if needed.  Max dose: 2 tabs/day. This is a 30 day prescription.) 12 tablet 6  . SUMAtriptan (IMITREX) 25 MG tablet Take 25 mg by mouth every 2 (two) hours as needed for migraine. May repeat in 2 hours  if headache persists or recurs.    Marland Kitchen tiZANidine (ZANAFLEX) 4 MG tablet Take 1 tablet (4 mg total) by mouth every 6 (six) hours as needed for muscle spasms. TAKE 1 TABLET BY MOUTH EVERY 6 HOURS AS NEEDED FOR MUSCLE SPASMS. 30 tablet 6  . tolterodine (DETROL LA) 4 MG 24 hr capsule Take 4 mg by mouth daily.     No current facility-administered medications for this visit.   Allergies  Allergen Reactions  . Codeine Anaphylaxis    Anything with codeine     Review of Systems: Positive for anxiety, vision changes, confusion, cough, depression, fatigue, headaches, shortness of breath, sleeping problems, excessive urination, urine leakage.  All other systems reviewed and negative except where noted in HPI.    PHYSICAL EXAM :    Wt Readings from Last 3 Encounters:  05/20/20 191 lb (86.6 kg)  03/24/20 179 lb (81.2 kg)  03/16/20 179 lb (81.2 kg)    BP 100/74   Pulse 81   Ht 5\' 2"  (1.575 m)   Wt 191 lb (86.6 kg)   LMP 05/02/2019 Comment: irregular cycles  SpO2 99%   BMI 34.93 kg/m  Constitutional:  Pleasant female in no acute distress. Psychiatric: Normal mood and affect. Behavior is normal. EENT: Pupils normal.  Conjunctivae are normal. No scleral icterus. Neck supple.  Cardiovascular: Normal rate, regular rhythm. No edema Pulmonary/chest: Effort normal and breath sounds normal. No wheezing, rales or rhonchi. Abdominal: Soft, nondistended, nontender. Bowel sounds active throughout. There are no masses palpable. No hepatomegaly. Rectal: adequate sphincter squeeze pressure. Diminished pelvic floor descent with bearing down.  Neurological: Alert and oriented to person place and time. Skin: Skin is warm and dry. No rashes noted.  Tye Savoy, NP  05/20/2020, 10:57 AM  Cc:  Referring Provider Maximiano Coss, NP

## 2020-05-20 NOTE — Progress Notes (Signed)
Reviewed and agree with management plan.  Melicia Esqueda T. Adian Jablonowski, MD FACG [phone]  

## 2020-05-20 NOTE — Patient Instructions (Signed)
If you are age 52 or older, your body mass index should be between 23-30. Your Body mass index is 34.93 kg/m. If this is out of the aforementioned range listed, please consider follow up with your Primary Care Provider.  If you are age 44 or younger, your body mass index should be between 19-25. Your Body mass index is 34.93 kg/m. If this is out of the aformentioned range listed, please consider follow up with your Primary Care Provider.   You have been scheduled for a colonoscopy. Please follow written instructions given to you at your visit today.  Please pick up your prep supplies at the pharmacy within the next 1-3 days. If you use inhalers (even only as needed), please bring them with you on the day of your procedure.

## 2020-05-27 ENCOUNTER — Ambulatory Visit: Payer: Medicare Other | Admitting: Neurology

## 2020-05-31 ENCOUNTER — Ambulatory Visit (INDEPENDENT_AMBULATORY_CARE_PROVIDER_SITE_OTHER): Payer: Medicare Other | Admitting: Family Medicine

## 2020-05-31 ENCOUNTER — Other Ambulatory Visit: Payer: Self-pay

## 2020-05-31 ENCOUNTER — Encounter: Payer: Self-pay | Admitting: Family Medicine

## 2020-05-31 VITALS — BP 128/72 | HR 72 | Temp 98.3°F | Resp 16 | Ht 62.0 in | Wt 192.8 lb

## 2020-05-31 DIAGNOSIS — F17210 Nicotine dependence, cigarettes, uncomplicated: Secondary | ICD-10-CM

## 2020-05-31 DIAGNOSIS — G43709 Chronic migraine without aura, not intractable, without status migrainosus: Secondary | ICD-10-CM | POA: Diagnosis not present

## 2020-05-31 DIAGNOSIS — Z13220 Encounter for screening for lipoid disorders: Secondary | ICD-10-CM | POA: Diagnosis not present

## 2020-05-31 DIAGNOSIS — N281 Cyst of kidney, acquired: Secondary | ICD-10-CM

## 2020-05-31 DIAGNOSIS — R161 Splenomegaly, not elsewhere classified: Secondary | ICD-10-CM

## 2020-05-31 DIAGNOSIS — Z0000 Encounter for general adult medical examination without abnormal findings: Secondary | ICD-10-CM | POA: Diagnosis not present

## 2020-05-31 DIAGNOSIS — R7989 Other specified abnormal findings of blood chemistry: Secondary | ICD-10-CM | POA: Diagnosis not present

## 2020-05-31 DIAGNOSIS — Z23 Encounter for immunization: Secondary | ICD-10-CM

## 2020-05-31 DIAGNOSIS — R739 Hyperglycemia, unspecified: Secondary | ICD-10-CM

## 2020-05-31 LAB — CBC WITH DIFFERENTIAL/PLATELET
Basophils Absolute: 0 10*3/uL (ref 0.0–0.1)
Basophils Relative: 0.3 % (ref 0.0–3.0)
Eosinophils Absolute: 0.3 10*3/uL (ref 0.0–0.7)
Eosinophils Relative: 3.1 % (ref 0.0–5.0)
HCT: 41.1 % (ref 36.0–46.0)
Hemoglobin: 13.9 g/dL (ref 12.0–15.0)
Lymphocytes Relative: 28.6 % (ref 12.0–46.0)
Lymphs Abs: 2.9 10*3/uL (ref 0.7–4.0)
MCHC: 33.7 g/dL (ref 30.0–36.0)
MCV: 90.4 fl (ref 78.0–100.0)
Monocytes Absolute: 0.4 10*3/uL (ref 0.1–1.0)
Monocytes Relative: 3.6 % (ref 3.0–12.0)
Neutro Abs: 6.4 10*3/uL (ref 1.4–7.7)
Neutrophils Relative %: 64.4 % (ref 43.0–77.0)
Platelets: 215 10*3/uL (ref 150.0–400.0)
RBC: 4.55 Mil/uL (ref 3.87–5.11)
RDW: 14.5 % (ref 11.5–15.5)
WBC: 10 10*3/uL (ref 4.0–10.5)

## 2020-05-31 LAB — COMPREHENSIVE METABOLIC PANEL
ALT: 36 U/L — ABNORMAL HIGH (ref 0–35)
AST: 31 U/L (ref 0–37)
Albumin: 4.5 g/dL (ref 3.5–5.2)
Alkaline Phosphatase: 86 U/L (ref 39–117)
BUN: 11 mg/dL (ref 6–23)
CO2: 28 mEq/L (ref 19–32)
Calcium: 9.5 mg/dL (ref 8.4–10.5)
Chloride: 102 mEq/L (ref 96–112)
Creatinine, Ser: 1.01 mg/dL (ref 0.40–1.20)
GFR: 64.19 mL/min (ref >60.00)
Glucose, Bld: 79 mg/dL (ref 70–99)
Potassium: 4.3 mEq/L (ref 3.5–5.1)
Sodium: 139 mEq/L (ref 135–145)
Total Bilirubin: 0.4 mg/dL (ref 0.2–1.2)
Total Protein: 7.5 g/dL (ref 6.0–8.3)

## 2020-05-31 LAB — LIPID PANEL
Cholesterol: 175 mg/dL (ref 0–200)
HDL: 41.4 mg/dL (ref >39.00)
LDL Cholesterol: 99 mg/dL (ref 0–99)
NonHDL: 133.48
Total CHOL/HDL Ratio: 4
Triglycerides: 171 mg/dL — ABNORMAL HIGH (ref 0.0–149.0)
VLDL: 34.2 mg/dL (ref 0.0–40.0)

## 2020-05-31 LAB — HEMOGLOBIN A1C: Hgb A1c MFr Bld: 5.7 % (ref 4.6–6.5)

## 2020-05-31 MED ORDER — SUMATRIPTAN SUCCINATE 25 MG PO TABS: 25.0000 mg | ORAL_TABLET | ORAL | 6 refills | Status: DC | PRN

## 2020-05-31 MED ORDER — RIZATRIPTAN BENZOATE 10 MG PO TABS
ORAL_TABLET | ORAL | 6 refills | Status: DC
Start: 2020-05-31 — End: 2020-12-10

## 2020-05-31 NOTE — Progress Notes (Signed)
Subjective:  Patient ID: Lauren Chapman, female    DOB: 1970/11/09  Age: 50 y.o. MRN: [id number]  CC:  Chief Complaint  Patient presents with  . Annual Exam    Pt here for physical today. Notes her neurologist has asked she begin getting her imatrex and maxalt through PCP as they do not need follow up any further with neuro, pt is eligible for Shingles vaccine, pt states has never had Chicken pox as a child     HPI Lauren Chapman presents for   Presents for annual physical exam.  Last visit with me in November  Bipolar disorder Treated with Lamictal, Zoloft, Brintellix, biotin, Cogentin, gabapentin. Followed by Dr. Reece Levy, counselor Jhonnie Garner.   Migraine headache Dr. Krista Blue, neurology, visit noted from March 2.  Previously treated with Inderal, Botox injection which was helpful.  Dr. Posey Pronto.  Improved control since starting Emgality, with Maxalt as needed. Uses either rizatriptan or IMitrex if flair. Normal MRI of the brain in January 2019 per neuro note.  Taking imitrex few days per week.    Fecal incontinence/Gastoenterologic Note reviewed from 05/20/2020 with gastroenterology, Tye Savoy, NP.  Neurologic work-up including MRI is unremarkable.  Plan for pelvic floor physical therapy in June.  Also noted to have rectal bleeding for 6 months, possible hemorrhoidal.  Negative Cologuard in April 2021.  Plan for colonoscopy.  Takes Nexium for GERD.  Elevated creatinine Most recent level 1.23, previous range 0.9-1.18. Ibuprofen once per week. Drinking water during the day.   Splenomegaly Noted on CT scan in February.  14 cm. Normal platelets recently.  Lab Results  Component Value Date   WBC 10.0 05/31/2020   HGB 13.9 05/31/2020   HCT 41.1 05/31/2020   MCV 90.4 05/31/2020   PLT 215.0 05/31/2020   Cancer screening As above, negative Cologuard last year, planning for colonoscopy for other symptoms. Mammogram 02/16/2020 Pap testing 05/15/2017, negative with negative HPV testing.   Repeat 2024, had hysterectomy for excessive bleeding few years ago.   Immunization History  Administered Date(s) Administered  . Influenza Inj Mdck Quad Pf 01/03/2019  . Influenza Split 10/11/2011  . Influenza,inj,Quad PF,6+ Mos 12/04/2017, 12/01/2019  . Influenza-Unspecified 01/03/2019  . PFIZER(Purple Top)SARS-COV-2 Vaccination 05/10/2019, 06/19/2019, 01/21/2020  . Tdap 10/16/2018  . Zoster Recombinat (Shingrix) 05/31/2020  Shingles vaccine: today.   Depression screen [facility] 2/9 05/31/2020 03/15/2020 02/23/2020 02/10/2020 01/22/2020  Decreased Interest 2 0 0 0 0  Down, Depressed, Hopeless 3 0 0 0 0  PHQ - 2 Score 5 0 0 0 0  Altered sleeping 0 - - - -  Tired, decreased energy 1 - - - -  Change in appetite 1 - - - -  Feeling bad or failure about yourself  1 - - - -  Trouble concentrating 0 - - - -  Moving slowly or fidgety/restless 1 - - - -  Suicidal thoughts 1 - - - -  PHQ-9 Score 10 - - - -  Difficult doing work/chores Very difficult - - - -  Some recent data might be hidden  History of bipolar disorder as above.  Followed by psychiatry.  On medications as above.  Suicidal ideation - chronic. Prior hospitalization, now outpatient intensive counseling. No current suicidal thoughts.    Hearing Screening   125Hz  250Hz  500Hz  1000Hz  2000Hz  3000Hz  4000Hz  6000Hz  8000Hz   Right ear:           Left ear:  Visual Acuity Screening   Right eye Left eye Both eyes  Without correction:     With correction: 20/15-1 20/15-1 20/15  wears glasses. appt with Dr. Katy Fitch in past year. S/p cataracts.   Dental: has dentist. Has seen in past 6 months.   Exercise: walking daily - 30 min.  nicotine addiction: quit then restarted 3 times since our last. Longest 3 weeks. Back at 1/2 ppd. Has otc nicotine patch as option.   Glucose 106 in 2020 -  Lab Results  Component Value Date   HGBA1C 5.7 05/31/2020    History Patient Active Problem List   Diagnosis Date Noted  . Depression 03/24/2020   . Bilateral low back pain with left-sided sciatica 03/24/2020  . Left lumbar radiculopathy 02/25/2020  . Absence of bladder continence 02/25/2020  . History of postmenopausal bleeding 04/29/2019  . Polypharmacy 04/03/2019  . Emphysema, unspecified ([place]) 05/17/2018  . Weakness 01/25/2017  . Chronic migraine w/o aura w/o status migrainosus, not intractable 12/22/2014  . Encounter for other general counseling or advice on contraception 09/02/2014  . Bipolar disorder ([place]) 10/12/2011  . Anxiety and depression 10/12/2011  . GERD (gastroesophageal reflux disease) 10/12/2011   Past Medical History:  Diagnosis Date  . Anxiety   . Bipolar affective psychosis ([place])   . Complication of anesthesia    " Acting silly- waving at everyone"-[place]iddy  . Depression   . Emphysema lung ([facility])    inhaler prn,  followed by pcp and pulmonologist-- dr Margorie John  . Family history of adverse reaction to anesthesia    Father had rheumatic fever, and died on operating table.  Marland Kitchen GERD (gastroesophageal reflux disease)   . Migraine   . Smokers' cough ([place])    per pt not productive  . SUI (stress urinary incontinence, female)   . Wears glasses    Past Surgical History:  Procedure Laterality Date  . ABDOMINAL HYSTERECTOMY N/A    Phreesia 11/30/2019  . CARPAL TUNNEL RELEASE Right 09-12-2007   @[email or web address]    and GANGLION CYST EXCISION  . CYSTOSCOPY N/A 04/29/2019   Procedure: CYSTOSCOPY;  Surgeon: Joseph Pierini, MD;  Location: [facility];  Service: Gynecology;  Laterality: N/A;  . DILATATION & CURETTAGE/HYSTEROSCOPY WITH MYOSURE N/A 11/13/2018   Procedure: [place];  Surgeon: Anastasio Auerbach, MD;  Location: [place];  Service: Gynecology;  Laterality: N/A;  request 9:00am OR start time in [place] [facility] block requests one hour  . DILATION AND CURETTAGE OF UTERUS  02/2019  . EYE SURGERY N/A    Phreesia 11/30/2019  . GANGLION CYST EXCISION      R wrist  . KNEE ARTHROSCOPY WITH ANTERIOR CRUCIATE LIGAMENT (ACL) REPAIR Right 10/2015  . LAPAROSCOPIC CHOLECYSTECTOMY  1997  . LAPAROSCOPIC HYSTERECTOMY Bilateral 04/29/2019   Procedure: HYSTERECTOMY TOTAL LAPAROSCOPIC, BILATERAL SALPINO-OOPHORECTOMY;  Surgeon: Joseph Pierini, MD;  Location: [place];  Service: Gynecology;  Laterality: Bilateral;  . TOOTH EXTRACTION  04/01/2019   Allergies  Allergen Reactions  . Codeine Anaphylaxis    Anything with codeine   Prior to Admission medications   Medication Sig Start Date End Date Taking? Authorizing Provider  albuterol (VENTOLIN HFA) 108 (90 Base) MCG/ACT inhaler Inhale 2 puffs into the lungs every 4 (four) hours as needed for wheezing or shortness of breath. 02/20/20  Yes Olalere, Ernesto Rutherford, MD  aspirin-acetaminophen-caffeine (EXCEDRIN MIGRAINE) 250-250-65 MG tablet Take 2 tablets by mouth every 6 (six) hours as needed for headache.   Yes [provider]  citalopram (CELEXA) 20 MG tablet Take 20 mg by mouth daily.   Yes [provider]  clonazePAM (KLONOPIN) 0.5 MG tablet Take 0.5 mg by mouth 2 (two) times daily. 12/04/17  Yes [provider]  esomeprazole (NEXIUM) 20 MG capsule Take 1 capsule (20 mg total) by mouth 2 (two) times daily before a meal. Patient taking differently: Take 20 mg by mouth 2 (two) times daily as needed (indigestion). 03/21/19  Yes Jacelyn Pi, Lilia Argue, MD  gabapentin (NEURONTIN) 800 MG tablet Take 800 mg by mouth 3 (three) times daily.  07/02/18  Yes [provider]  Galcanezumab-gnlm (EMGALITY) 120 MG/ML SOAJ Inject 120 mg into the skin every 30 (thirty) days. 03/24/20  Yes Marcial Pacas, MD  Glucosamine HCl (GLUCOSAMINE PO) Take 1 tablet by mouth daily as needed (arthritis).   Yes [provider]  hydrOXYzine (VISTARIL) 25 MG capsule Take 25 mg by mouth as needed. 04/20/20  Yes [provider]  lamoTRIgine (LAMICTAL) 150 MG tablet Take 150 mg by mouth 2  (two) times daily.    Yes [provider]  ondansetron (ZOFRAN) 4 MG tablet Take 1 tablet (4 mg total) by mouth every 8 (eight) hours as needed for nausea or vomiting. 01/05/20  Yes Suzzanne Cloud, NP  rizatriptan (MAXALT) 10 MG tablet Take 1 tab at onset of migraine.  May repeat in 2 hrs, if needed.  Max dose: 2 tabs/day. This is a 30 day prescription. Patient taking differently: Take 1 tab at onset of migraine.  May repeat in 2 hrs, if needed.  Max dose: 2 tabs/day. This is a 30 day prescription. 01/05/20  Yes Suzzanne Cloud, NP  SUMAtriptan (IMITREX) 25 MG tablet Take 25 mg by mouth every 2 (two) hours as needed for migraine. May repeat in 2 hours if headache persists or recurs.   Yes [provider]  tiZANidine (ZANAFLEX) 4 MG tablet Take 1 tablet (4 mg total) by mouth every 6 (six) hours as needed for muscle spasms. TAKE 1 TABLET BY MOUTH EVERY 6 HOURS AS NEEDED FOR MUSCLE SPASMS. 01/05/20  Yes Suzzanne Cloud, NP  tolterodine (DETROL LA) 4 MG 24 hr capsule Take 4 mg by mouth daily. 04/27/20  Yes [provider]   Social History   Socioeconomic History  . Marital status: Divorced    Spouse name: n/a  . Number of children: 2  . Years of education: 75  . Highest education level: Not on file  Occupational History  . Occupation: Disabled    Comment: Formerly a Pharmacist, hospital.  Tobacco Use  . Smoking status: Current Every Day Smoker    Packs/day: 0.50    Years: 33.00    Pack years: 16.50    Types: Cigarettes    Start date: 10/10/1981    Last attempt to quit: 11/10/2019    Years since quitting: 0.5  . Smokeless tobacco: Never Used  Vaping Use  . Vaping Use: Never used  Substance and Sexual Activity  . Alcohol use: Never    Alcohol/week: 0.0 standard drinks  . Drug use: Never  . Sexual activity: Not Currently    Partners: Male    Birth control/protection: Post-menopausal    Comment: 1st intercourse 50 yo-More than 5 partners  Other Topics Concern  . Not on file   Social History Narrative   Lives at home with 1 of her two daughters   Right-handed.   2-4 cups caffeine daily.   Disabled  Social Determinants of Health   Financial Resource Strain: Not on file  Food Insecurity: Not on file  Transportation Needs: Not on file  Physical Activity: Not on file  Stress: Not on file  Social Connections: Not on file  Intimate Partner Violence: Not on file    Review of Systems 13 point review of systems per patient health survey noted.  Negative other than as indicated above or in HPI.    Objective:   Vitals:   05/31/20 0847  BP: 128/72  Pulse: 72  Resp: 16  Temp: 98.3 F (36.8 C)  TempSrc: Temporal  SpO2: 94%  Weight: 192 lb 12.8 oz (87.5 kg)  Height: 5\' 2"  (1.575 m)     Physical Exam Vitals reviewed.  Constitutional:      Appearance: She is well-developed.  HENT:     Head: Normocephalic and atraumatic.     Right Ear: External ear normal.     Left Ear: External ear normal.  Eyes:     Conjunctiva/sclera: Conjunctivae normal.     Pupils: Pupils are equal, round, and reactive to light.  Neck:     Thyroid: No thyromegaly.  Cardiovascular:     Rate and Rhythm: Normal rate and regular rhythm.     Heart sounds: Normal heart sounds. No murmur heard.   Pulmonary:     Effort: Pulmonary effort is normal. No respiratory distress.     Breath sounds: Normal breath sounds. No wheezing.  Abdominal:     General: Bowel sounds are normal.     Palpations: Abdomen is soft.     Tenderness: There is no abdominal tenderness.  Musculoskeletal:        General: No tenderness. Normal range of motion.     Cervical back: Normal range of motion and neck supple.  Lymphadenopathy:     Cervical: No cervical adenopathy.  Skin:    General: Skin is warm and dry.     Findings: No rash.  Neurological:     Mental Status: She is alert and oriented to person, place, and time.  Psychiatric:        Behavior: Behavior normal.        Thought Content:  Thought content normal.        Assessment & Plan:  Lauren Chapman is a 50 y.o. female . Annual physical exam  - -anticipatory guidance as below in AVS, screening labs above. Health maintenance items as above in HPI discussed/recommended as applicable.   Splenomegaly - Plan: CBC with Differential/Platelet  -Repeat CBC to evaluate platelets, consider hematology eval, plan for close follow-up with gastroenterology as above initially.  Renal cyst - Plan: Comprehensive metabolic panel  -Check renal function.  Elevated serum creatinine  -Avoid NSAIDs, repeat renal function.  Chronic migraine w/o aura w/o status migrainosus, not intractable - Plan: rizatriptan (MAXALT) 10 MG tablet, SUMAtriptan (IMITREX) 25 MG tablet  -Continue Emgality.  Temporary refill both Maxalt and Imitrex but discussed typical dosing with either 1, not both, and single repeat dose if needed.  She can also clarify with her neurologist.  Cigarette nicotine dependence without complication  -Continue attempts at smoking cessation.  Screening for hyperlipidemia - Plan: Lipid panel  Hyperglycemia - Plan: Hemoglobin A1c  Need for shingles vaccine - Plan: Varicella-zoster vaccine IM -Shingrix given.   Meds ordered this encounter  Medications  . rizatriptan (MAXALT) 10 MG tablet    Sig: Take 1 tab at onset of migraine.  May repeat in 2 hrs, if needed.  Max  dose: 2 tabs/day. This is a 30 day prescription.    Dispense:  12 tablet    Refill:  6    Do not refill in less than 30 days  . SUMAtriptan (IMITREX) 25 MG tablet    Sig: Take 1 tablet (25 mg total) by mouth every 2 (two) hours as needed for migraine. May repeat in 2 hours if headache persists or recurs.    Dispense:  10 tablet    Refill:  6   Patient Instructions   I would recommend discussing use of 2 different triptans with your neurologist.  Typically would recommend only one triptan, with repeat dose in 2 hours if the initial 1 does not help.  Again  please discussed that plan with your neurologist but I did refill both medications for now.    I will repeat some blood tests today. Please follow up after your colonoscopy to discuss the enlarged spleen and possible hematology evaluation.   Shingles vaccine today and repeat in 2-6 months.  Keep up the good work with trying to quit smoking, see information below.   Return to the clinic or go to the nearest emergency room if any of your symptoms worsen or new symptoms occur.   Managing the Challenge of Quitting Smoking Quitting smoking is a physical and mental challenge. You will face cravings, withdrawal symptoms, and temptation. Before quitting, work with your health care provider to make a plan that can help you manage quitting. Preparation can help you quit and keep you from giving in. How to manage lifestyle changes Managing stress Stress can make you want to smoke, and wanting to smoke may cause stress. It is important to find ways to manage your stress. You might try some of the following:  Practice relaxation techniques. ? Breathe slowly and deeply, in through your nose and out through your mouth. ? Listen to music. ? Soak in a bath or take a shower. ? Imagine a peaceful place or vacation.  Get some support. ? Talk with family or friends about your stress. ? Join a support group. ? Talk with a counselor or therapist.  Get some physical activity. ? Go for a walk, run, or bike ride. ? Play a favorite sport. ? Practice yoga.   Medicines Talk with your health care provider about medicines that might help you deal with cravings and make quitting easier for you. Relationships Social situations can be difficult when you are quitting smoking. To manage this, you can:  Avoid parties and other social situations where people might be smoking.  Avoid alcohol.  Leave right away if you have the urge to smoke.  Explain to your family and friends that you are quitting smoking. Ask  for support and let them know you might be a bit grumpy.  Plan activities where smoking is not an option. General instructions Be aware that many people gain weight after they quit smoking. However, not everyone does. To keep from gaining weight, have a plan in place before you quit and stick to the plan after you quit. Your plan should include:  Having healthy snacks. When you have a craving, it may help to: ? Eat popcorn, carrots, celery, or other cut vegetables. ? Chew sugar-free gum.  Changing how you eat. ? Eat small portion sizes at meals. ? Eat 4-6 small meals throughout the day instead of 1-2 large meals a day. ? Be mindful when you eat. Do not watch television or do other things that might distract  you as you eat.  Exercising regularly. ? Make time to exercise each day. If you do not have time for a long workout, do short bouts of exercise for 5-10 minutes several times a day. ? Do some form of strengthening exercise, such as weight lifting. ? Do some exercise that gets your heart beating and causes you to breathe deeply, such as walking fast, running, swimming, or biking. This is very important.  Drinking plenty of water or other low-calorie or no-calorie drinks. Drink 6-8 glasses of water daily.   How to recognize withdrawal symptoms Your body and mind may experience discomfort as you try to get used to not having nicotine in your system. These effects are called withdrawal symptoms. They may include:  Feeling hungrier than normal.  Having trouble concentrating.  Feeling irritable or restless.  Having trouble sleeping.  Feeling depressed.  Craving a cigarette. To manage withdrawal symptoms:  Avoid places, people, and activities that trigger your cravings.  Remember why you want to quit.  Get plenty of sleep.  Avoid coffee and other caffeinated drinks. These may worsen some of your symptoms. These symptoms may surprise you. But be assured that they are normal to  have when quitting smoking. How to manage cravings Come up with a plan for how to deal with your cravings. The plan should include the following:  A definition of the specific situation you want to deal with.  An alternative action you will take.  A clear idea for how this action will help.  The name of someone who might help you with this. Cravings usually last for 5-10 minutes. Consider taking the following actions to help you with your plan to deal with cravings:  Keep your mouth busy. ? Chew sugar-free gum. ? Suck on hard candies or a straw. ? Brush your teeth.  Keep your hands and body busy. ? Change to a different activity right away. ? Squeeze or play with a ball. ? Do an activity or a hobby, such as making bead jewelry, practicing needlepoint, or working with wood. ? Mix up your normal routine. ? Take a short exercise break. Go for a quick walk or run up and down stairs.  Focus on doing something kind or helpful for someone else.  Call a friend or family member to talk during a craving.  Join a support group.  Contact a quitline. Where to find support To get help or find a support group:  Call the [facility]'s Smoking Quitline: 1-800-QUIT NOW 305-679-9177)  Visit the website of the Substance Abuse and [place]: ktimeonline.com  Text QUIT to SmokefreeTXTAZ:4618977 Where to find more information Visit these websites to find more information on quitting smoking:  [place]: www.smokefree.gov  American Lung Association: www.lung.org  American Cancer Society: www.cancer.org  Centers for Disease Control and Prevention: http://www.wolf.info/  American Heart Association: www.heart.org Contact a health care provider if:  You want to change your plan for quitting.  The medicines you are taking are not helping.  Your eating feels out of control or you cannot sleep. Get help right away if:  You feel depressed or  become very anxious. Summary  Quitting smoking is a physical and mental challenge. You will face cravings, withdrawal symptoms, and temptation to smoke again. Preparation can help you as you go through these challenges.  Try different techniques to manage stress, handle social situations, and prevent weight gain.  You can deal with cravings by keeping your mouth busy (  such as by chewing gum), keeping your hands and body busy, calling family or friends, or contacting a quitline for people who want to quit smoking.  You can deal with withdrawal symptoms by avoiding places where people smoke, getting plenty of rest, and avoiding drinks with caffeine. This information is not intended to replace advice given to you by your health care provider. Make sure you discuss any questions you have with your health care provider. Document Revised: 10/29/2018 Document Reviewed: 10/29/2018 Elsevier Patient Education  [place] [facility]  Get These Tests  Blood Pressure- Have your blood pressure checked by your healthcare provider at least once a year.  Normal blood pressure is 120/80.  Weight- Have your body mass index (BMI) calculated to screen for obesity.  BMI is a measure of body fat based on height and weight.  You can calculate your own BMI at GravelBags.it  Cholesterol- Have your cholesterol checked every year.  Diabetes- Have your blood sugar checked every year if you have high blood pressure, high cholesterol, a family history of diabetes or if you are overweight.  Pap Test - Have a pap test every 1 to 5 years if you have been sexually active.  If you are older than 65 and recent pap tests have been normal you may not need additional pap tests.  In addition, if you have had a hysterectomy  for benign disease additional pap tests are not necessary.  Mammogram-Yearly mammograms are essential for early detection of breast cancer  Screening for Colon Cancer-  Colonoscopy starting at age 12. Screening may begin sooner depending on your family history and other health conditions.  Follow up colonoscopy as directed by your Gastroenterologist.  Screening for Osteoporosis- Screening begins at age 97 with bone density scanning, sooner if you are at higher risk for developing Osteoporosis.  Get these medicines  Calcium with Vitamin D- Your body requires 1200-1500 mg of Calcium a day and [phone] IU of Vitamin D a day.  You can only absorb 500 mg of Calcium at a time therefore Calcium must be taken in 2 or 3 separate doses throughout the day.  Hormones- Hormone therapy has been associated with increased risk for certain cancers and heart disease.  Talk to your healthcare provider about if you need relief from menopausal symptoms.  Aspirin- Ask your healthcare provider about taking Aspirin to prevent Heart Disease and Stroke.  Get these Immuniztions  Flu shot- Every fall  Pneumonia shot- Once after the age of 24; if you are younger ask your healthcare provider if you need a pneumonia shot.  Tetanus- Every ten years.  shingrix - after the age of 33 to prevent shingles.  Take these steps  Don't smoke- Your healthcare provider can help you quit. For tips on how to quit, ask your healthcare provider or go to www.smokefree.gov or call 1-800 QUIT-NOW.  Be physically active- Exercise 5 days a week for a minimum of 30 minutes.  If you are not already physically active, start slow and gradually work up to 30 minutes of moderate physical activity.  Try walking, dancing, bike riding, swimming, etc.  Eat a healthy diet- Eat a variety of healthy foods such as fruits, vegetables, whole grains, low fat milk, low fat cheeses, yogurt, lean meats, chicken, fish, eggs, dried beans, tofu, etc.  For more information go to www.thenutritionsource.org  Dental visit- Brush and floss teeth twice daily; visit your dentist twice a year.  Eye exam- Visit your  Optometrist or  Ophthalmologist yearly.  Drink alcohol in moderation- Limit alcohol intake to one drink or less a day.  Never drink and drive.  Depression- Your emotional health is as important as your physical health.  If you're feeling down or losing interest in things you normally enjoy, please talk to your healthcare provider.  Seat Belts- can save your life; always wear one  Smoke/Carbon Monoxide detectors- These detectors need to be installed on the appropriate level of your home.  Replace batteries at least once a year.  Violence- If anyone is threatening or hurting you, please tell your healthcare provider.  Living Will/ Health care power of attorney- Discuss with your healthcare provider and family.       Signed, Merri Ray, MD [facility]

## 2020-05-31 NOTE — Patient Instructions (Addendum)
I would recommend discussing use of 2 different triptans with your neurologist.  Typically would recommend only one triptan, with repeat dose in 2 hours if the initial 1 does not help.  Again please discussed that plan with your neurologist but I did refill both medications for now.    I will repeat some blood tests today. Please follow up after your colonoscopy to discuss the enlarged spleen and possible hematology evaluation.   Shingles vaccine today and repeat in 2-6 months.  Keep up the good work with trying to quit smoking, see information below.   Return to the clinic or go to the nearest emergency room if any of your symptoms worsen or new symptoms occur.   Managing the Challenge of Quitting Smoking Quitting smoking is a physical and mental challenge. You will face cravings, withdrawal symptoms, and temptation. Before quitting, work with your health care provider to make a plan that can help you manage quitting. Preparation can help you quit and keep you from giving in. How to manage lifestyle changes Managing stress Stress can make you want to smoke, and wanting to smoke may cause stress. It is important to find ways to manage your stress. You might try some of the following:  Practice relaxation techniques. ? Breathe slowly and deeply, in through your nose and out through your mouth. ? Listen to music. ? Soak in a bath or take a shower. ? Imagine a peaceful place or vacation.  Get some support. ? Talk with family or friends about your stress. ? Join a support group. ? Talk with a counselor or therapist.  Get some physical activity. ? Go for a walk, run, or bike ride. ? Play a favorite sport. ? Practice yoga.   Medicines Talk with your health care provider about medicines that might help you deal with cravings and make quitting easier for you. Relationships Social situations can be difficult when you are quitting smoking. To manage this, you can:  Avoid parties and other  social situations where people might be smoking.  Avoid alcohol.  Leave right away if you have the urge to smoke.  Explain to your family and friends that you are quitting smoking. Ask for support and let them know you might be a bit grumpy.  Plan activities where smoking is not an option. General instructions Be aware that many people gain weight after they quit smoking. However, not everyone does. To keep from gaining weight, have a plan in place before you quit and stick to the plan after you quit. Your plan should include:  Having healthy snacks. When you have a craving, it may help to: ? Eat popcorn, carrots, celery, or other cut vegetables. ? Chew sugar-free gum.  Changing how you eat. ? Eat small portion sizes at meals. ? Eat 4-6 small meals throughout the day instead of 1-2 large meals a day. ? Be mindful when you eat. Do not watch television or do other things that might distract you as you eat.  Exercising regularly. ? Make time to exercise each day. If you do not have time for a long workout, do short bouts of exercise for 5-10 minutes several times a day. ? Do some form of strengthening exercise, such as weight lifting. ? Do some exercise that gets your heart beating and causes you to breathe deeply, such as walking fast, running, swimming, or biking. This is very important.  Drinking plenty of water or other low-calorie or no-calorie drinks. Drink 6-8 glasses of water  daily.   How to recognize withdrawal symptoms Your body and mind may experience discomfort as you try to get used to not having nicotine in your system. These effects are called withdrawal symptoms. They may include:  Feeling hungrier than normal.  Having trouble concentrating.  Feeling irritable or restless.  Having trouble sleeping.  Feeling depressed.  Craving a cigarette. To manage withdrawal symptoms:  Avoid places, people, and activities that trigger your cravings.  Remember why you want  to quit.  Get plenty of sleep.  Avoid coffee and other caffeinated drinks. These may worsen some of your symptoms. These symptoms may surprise you. But be assured that they are normal to have when quitting smoking. How to manage cravings Come up with a plan for how to deal with your cravings. The plan should include the following:  A definition of the specific situation you want to deal with.  An alternative action you will take.  A clear idea for how this action will help.  The name of someone who might help you with this. Cravings usually last for 5-10 minutes. Consider taking the following actions to help you with your plan to deal with cravings:  Keep your mouth busy. ? Chew sugar-free gum. ? Suck on hard candies or a straw. ? Brush your teeth.  Keep your hands and body busy. ? Change to a different activity right away. ? Squeeze or play with a ball. ? Do an activity or a hobby, such as making bead jewelry, practicing needlepoint, or working with wood. ? Mix up your normal routine. ? Take a short exercise break. Go for a quick walk or run up and down stairs.  Focus on doing something kind or helpful for someone else.  Call a friend or family member to talk during a craving.  Join a support group.  Contact a quitline. Where to find support To get help or find a support group:  Call the [facility]'s Smoking Quitline: 1-800-QUIT NOW 380-769-8945)  Visit the website of the Substance Abuse and [place]: ktimeonline.com  Text QUIT to SmokefreeTXT: 536644 Where to find more information Visit these websites to find more information on quitting smoking:  [place]: www.smokefree.gov  American Lung Association: www.lung.org  American Cancer Society: www.cancer.org  Centers for Disease Control and Prevention: http://www.wolf.info/  American Heart Association: www.heart.org Contact a health care provider if:  You want to  change your plan for quitting.  The medicines you are taking are not helping.  Your eating feels out of control or you cannot sleep. Get help right away if:  You feel depressed or become very anxious. Summary  Quitting smoking is a physical and mental challenge. You will face cravings, withdrawal symptoms, and temptation to smoke again. Preparation can help you as you go through these challenges.  Try different techniques to manage stress, handle social situations, and prevent weight gain.  You can deal with cravings by keeping your mouth busy (such as by chewing gum), keeping your hands and body busy, calling family or friends, or contacting a quitline for people who want to quit smoking.  You can deal with withdrawal symptoms by avoiding places where people smoke, getting plenty of rest, and avoiding drinks with caffeine. This information is not intended to replace advice given to you by your health care provider. Make sure you discuss any questions you have with your health care provider. Document Revised: 10/29/2018 Document Reviewed: 10/29/2018 Elsevier Patient Education  [place].  Keeping You Healthy  Get These Tests  Blood Pressure- Have your blood pressure checked by your healthcare provider at least once a year.  Normal blood pressure is 120/80.  Weight- Have your body mass index (BMI) calculated to screen for obesity.  BMI is a measure of body fat based on height and weight.  You can calculate your own BMI at [employer].it  Cholesterol- Have your cholesterol checked every year.  Diabetes- Have your blood sugar checked every year if you have high blood pressure, high cholesterol, a family history of diabetes or if you are overweight.  Pap Test - Have a pap test every 1 to 5 years if you have been sexually active.  If you are older than 65 and recent pap tests have been normal you may not need additional pap tests.  In addition, if you have had a  hysterectomy  for benign disease additional pap tests are not necessary.  Mammogram-Yearly mammograms are essential for early detection of breast cancer  Screening for Colon Cancer- Colonoscopy starting at age 33. Screening may begin sooner depending on your family history and other health conditions.  Follow up colonoscopy as directed by your Gastroenterologist.  Screening for Osteoporosis- Screening begins at age 23 with bone density scanning, sooner if you are at higher risk for developing Osteoporosis.  Get these medicines  Calcium with Vitamin D- Your body requires 1200-1500 mg of Calcium a day and [id number] IU of Vitamin D a day.  You can only absorb 500 mg of Calcium at a time therefore Calcium must be taken in 2 or 3 separate doses throughout the day.  Hormones- Hormone therapy has been associated with increased risk for certain cancers and heart disease.  Talk to your healthcare provider about if you need relief from menopausal symptoms.  Aspirin- Ask your healthcare provider about taking Aspirin to prevent Heart Disease and Stroke.  Get these Immuniztions  Flu shot- Every fall  Pneumonia shot- Once after the age of 29; if you are younger ask your healthcare provider if you need a pneumonia shot.  Tetanus- Every ten years.  shingrix - after the age of 51 to prevent shingles.  Take these steps  Don't smoke- Your healthcare provider can help you quit. For tips on how to quit, ask your healthcare provider or go to www.smokefree.gov or call 1-800 QUIT-NOW.  Be physically active- Exercise 5 days a week for a minimum of 30 minutes.  If you are not already physically active, start slow and gradually work up to 30 minutes of moderate physical activity.  Try walking, dancing, bike riding, swimming, etc.  Eat a healthy diet- Eat a variety of healthy foods such as fruits, vegetables, whole grains, low fat milk, low fat cheeses, yogurt, lean meats, chicken, fish, eggs, dried beans, tofu,  etc.  For more information go to www.thenutritionsource.org  Dental visit- Brush and floss teeth twice daily; visit your dentist twice a year.  Eye exam- Visit your Optometrist or Ophthalmologist yearly.  Drink alcohol in moderation- Limit alcohol intake to one drink or less a day.  Never drink and drive.  Depression- Your emotional health is as important as your physical health.  If you're feeling down or losing interest in things you normally enjoy, please talk to your healthcare provider.  Seat Belts- can save your life; always wear one  Smoke/Carbon Monoxide detectors- These detectors need to be installed on the appropriate level of your home.  Replace batteries at least once a year.  Violence-  If anyone is threatening or hurting you, please tell your healthcare provider.  Living Will/ Health care power of attorney- Discuss with your healthcare provider and family.

## 2020-06-10 ENCOUNTER — Encounter: Payer: Medicare Other | Admitting: Obstetrics and Gynecology

## 2020-06-30 ENCOUNTER — Emergency Department (HOSPITAL_COMMUNITY): Payer: Medicare Other

## 2020-06-30 ENCOUNTER — Emergency Department (HOSPITAL_COMMUNITY)
Admission: EM | Admit: 2020-06-30 | Discharge: 2020-06-30 | Disposition: A | Discharge status: Home / Self Care | Payer: Medicare Other | Attending: Physician Assistant | Admitting: Physician Assistant

## 2020-06-30 ENCOUNTER — Encounter (HOSPITAL_COMMUNITY): Payer: Self-pay

## 2020-06-30 ENCOUNTER — Other Ambulatory Visit: Payer: Self-pay

## 2020-06-30 ENCOUNTER — Ambulatory Visit (HOSPITAL_COMMUNITY)
Admission: EM | Admit: 2020-06-30 | Discharge: 2020-06-30 | Disposition: A | Discharge status: Home / Self Care | Payer: Medicare Other

## 2020-06-30 DIAGNOSIS — W228XXA Striking against or struck by other objects, initial encounter: Secondary | ICD-10-CM | POA: Insufficient documentation

## 2020-06-30 DIAGNOSIS — S0990XA Unspecified injury of head, initial encounter: Secondary | ICD-10-CM | POA: Diagnosis present

## 2020-06-30 DIAGNOSIS — Z5321 Procedure and treatment not carried out due to patient leaving prior to being seen by health care provider: Secondary | ICD-10-CM | POA: Insufficient documentation

## 2020-06-30 DIAGNOSIS — W19XXXA Unspecified fall, initial encounter: Secondary | ICD-10-CM

## 2020-06-30 NOTE — ED Provider Notes (Signed)
CHIEF COMPLAINT:   Chief Complaint  Patient presents with  . Fall    SUBJECTIVE/HPI:  HPI A very pleasant 50 y.o.Female presents today with head injury secondary to fall while sleepwalking today.  Patient states that this morning she fell backwards as she was trying to sit on the couch and hit her head on a glass table.  Patient reports weakness, ear pain, headache and nausea.  Patient reports that she feels "awful" and states that it hurts to move her eyes, but denies any visual changes.  Patient states that her right ear feels as if it is "filled with cotton".  Patient does not report any bleeding from the area of impact, but states that she does have a "knot "in the area.  Patient also reports that "I am hurting all the way down my neck".  Patient also states that "my neck makes a lot of noise". Patient does not report any shortness of breath, chest pain, palpitations, visual changes, tingling, vomiting.   has a past medical history of Anxiety, Bipolar affective psychosis ([place]), Complication of anesthesia, Depression, Emphysema lung ([place]), Family history of adverse reaction to anesthesia, GERD (gastroesophageal reflux disease), Migraine, Smokers' cough ([place]), SUI (stress urinary incontinence, female), and Wears glasses. ROS:  Review of Systems See Subjective/HPI Medications, Allergies and Problem List personally reviewed in Epic today OBJECTIVE:   Today's Vitals   06/30/20 1819 06/30/20 1821 06/30/20 1857  BP: 104/74    Pulse: 80    Resp: 17    Temp: 98.5 F (36.9 C)    TempSrc: Oral    SpO2: 96%    PainSc:  6  6    There is no height or weight on file to calculate BMI.  Physical Exam   General: Appears well-developed and well-nourished. No acute distress.  HEENT Head: Normocephalic.  Swelling noted to right occipital region measuring approximately 2.5 cm x 2.5 cm and is tender.  No bleeding. Ears: Hearing grossly intact, no drainage or visible deformity.  Eyes: Conjunctivae  and EOM are normal. No eye drainage or scleral icterus bilaterally.  Neck: Normal range of motion, neck is supple.  Neck is diffusely tender about entire posterior neck region. Cardiovascular: Normal rate Pulm/Chest: No respiratory distress Neurological: Alert and oriented to person, place, and time.  Skin: Skin is warm and dry.  Psychiatric: Normal mood, affect, behavior, and thought content.   Vital signs and nursing note reviewed.   Patient stable and cooperative with examination.  LABS/X-RAYS/EKG/MEDS:   No results found for any visits on 06/30/20.  MEDICAL DECISION MAKING:   Patient presents with head injury secondary to fall while sleepwalking today.  Patient states that this morning she fell backwards as she was trying to sit on the couch and hit her head on a glass table.  Patient reports weakness, ear pain, headache and nausea.  Patient reports that she feels "awful" and states that it hurts to move her eyes, but denies any visual changes.  Patient states that her right ear feels as if it is "filled with cotton".  Patient does not report any bleeding from the area of impact, but states that she does have a "knot "in the area.  Patient also reports that "I am hurting all the way down my neck".  Patient also states that "my neck makes a lot of noise". Patient does not report any shortness of breath, chest pain, palpitations, visual changes, tingling, vomiting.  Chart review completed.  Given symptoms along with assessment findings, feel  as if the patient would be best served in the emergency department for further evaluation.  Patient verbalized understanding and agreed with plan.  Patient to present to the emergency department for further evaluation.  Stable upon discharge.  ASSESSMENT/PLAN:  Injury of head, initial encounter  Fall, initial encounter   Plan:   Discharge Instructions     Your current condition warrants further evaluation and/or treatment which exceed services  available to you in this urgent care setting. I have discussed with you your currrent condition and the need for further evaluation and/or treatment in an emergency department setting. In response to my medical recommendation, you have opted to go to the emergency department.      A copy of these instructions have been given to the patient or responsible adult who demonstrated the ability to learn, asked appropriate questions, and verbalized understanding of the plan of care.  There were no barriers to learning identified.   Serafina Royals, FNP-C 06/30/20  This note was partially made with the aid of speech-to-text dictation; typographical errors are not intentional.   Serafina Royals, [facility] 06/30/20 1903

## 2020-06-30 NOTE — Discharge Instructions (Addendum)
Your current condition warrants further evaluation and/or treatment which exceed services available to you in this urgent care setting. I have discussed with you your currrent condition and the need for further evaluation and/or treatment in an emergency department setting. In response to my medical recommendation, you have opted to go to the emergency department. 

## 2020-06-30 NOTE — ED Provider Notes (Signed)
Emergency Medicine Provider Triage Evaluation Note  Lauren Chapman , a 50 y.o. female  was evaluated in triage.  Pt complains of a head injury. Pt was sleepwalking last night and fell backwards hitting her head. She is c/o headache, neck pain, nausea. Denies vomiting. Seen at [facility] and sent here for further eval  Review of Systems  Positive: Head injury, nausea, neck pain Negative: vomiting  Physical Exam  BP 114/77   Pulse 80   Temp 98.2 F (36.8 C) (Oral)   Resp 18   LMP 05/02/2019 Comment: irregular cycles  SpO2 97%  Gen:   Awake, no distress   Resp:  Normal effort  MSK:   Moves extremities without difficulty  Other:  ttp to the cervical spine  Medical Decision Making  Medically screening exam initiated at 7:19 PM.  Appropriate orders placed.  HANNIE SHOE was informed that the remainder of the evaluation will be completed by another provider, this initial triage assessment does not replace that evaluation, and the importance of remaining in the ED until their evaluation is complete.     Bishop Dublin 06/30/20 Dwan Bolt, MD 06/30/20 [phone]

## 2020-06-30 NOTE — ED Triage Notes (Signed)
Pt presents with head pain behind right ear and nausea after falling early this morning and hitting her head.

## 2020-06-30 NOTE — ED Notes (Signed)
Patient states she will call up to hospital tomorrow and get results of her CT scan but she is leaving now d/t wait time. Explained that the best way to get results of CT was to stay and see the provider. Patient states her neck hurts and she keeps falling asleep. Offered patient recliner. Patient states she just wants to go home and lie down.

## 2020-06-30 NOTE — ED Triage Notes (Signed)
Pt states that she was sleep walking last night and fell hit her head on glass table. No having headache, neck pain, R ear pain and nausea, sent from [facility]

## 2020-07-01 ENCOUNTER — Ambulatory Visit (HOSPITAL_COMMUNITY): Payer: Self-pay

## 2020-07-05 ENCOUNTER — Ambulatory Visit: Payer: Medicare Other | Admitting: Neurology

## 2020-07-05 ENCOUNTER — Ambulatory Visit (INDEPENDENT_AMBULATORY_CARE_PROVIDER_SITE_OTHER): Payer: Medicare Other | Admitting: Family Medicine

## 2020-07-05 ENCOUNTER — Encounter: Payer: Self-pay | Admitting: Family Medicine

## 2020-07-05 ENCOUNTER — Other Ambulatory Visit: Payer: Self-pay

## 2020-07-05 VITALS — BP 128/72 | HR 74 | Temp 98.3°F | Resp 16 | Ht 62.0 in | Wt 189.2 lb

## 2020-07-05 DIAGNOSIS — F513 Sleepwalking [somnambulism]: Secondary | ICD-10-CM

## 2020-07-05 DIAGNOSIS — R0683 Snoring: Secondary | ICD-10-CM

## 2020-07-05 DIAGNOSIS — S0003XA Contusion of scalp, initial encounter: Secondary | ICD-10-CM

## 2020-07-05 DIAGNOSIS — R400 Somnolence: Secondary | ICD-10-CM

## 2020-07-05 DIAGNOSIS — G44309 Post-traumatic headache, unspecified, not intractable: Secondary | ICD-10-CM

## 2020-07-05 NOTE — Progress Notes (Signed)
Subjective:  Patient ID: Lauren Chapman, female    DOB: June 30, 1970  Age: 50 y.o. MRN: [id number]  CC:  Chief Complaint  Patient presents with   Referral    Pt requesting a referral for sleep study notes sleep walking since childhood, snores, has been told stops breathing at times, has fallen while sleep walking before.    Headache    Pt had fallen in her sleep and bumped her head, did go to ER and had CT which was clear pt would like the bump checked to ensure doing okay since.     HPI Lauren Chapman presents for   Sleep disturbance: Chronic sleepwalking, has been told that she has pauses in her breathing.  Has experienced falls with sleepwalking, see below.  Has had sleepwalking since childhood. No prior treatment/sleep specialist. Daytime sleepiness as well - when quiet, has fallen asleep abruptly. No hx of narcolepsy known. Has been treated with remeron by behavioral health in past for trouble staying asleep.  No prior sleep study.  Does snore with witnessed pauses at times.    Head contusion: Occurred 06/30/20.  Feel when sleepwalking, backwards hitting head on glass table. Went to ER after initial urgent care eval.  CT head and C spine: No acute intracranial abnormalities, multilevel degenerative changes noted within the C-spine but no acute abnormality seen within C-spine.  Still some soreness in area, slight headache - getting better. Swelling on skin improving. No wounds.   History Patient Active Problem List   Diagnosis Date Noted   Depression 03/24/2020   Bilateral low back pain with left-sided sciatica 03/24/2020   Left lumbar radiculopathy 02/25/2020   Absence of bladder continence 02/25/2020   History of postmenopausal bleeding 04/29/2019   Polypharmacy 04/03/2019   Emphysema, unspecified ([place]) 05/17/2018   Weakness 01/25/2017   Chronic migraine w/o aura w/o status migrainosus, not intractable 12/22/2014   Encounter for other general counseling or advice on  contraception 09/02/2014   Bipolar disorder ([place]) 10/12/2011   Anxiety and depression 10/12/2011   GERD (gastroesophageal reflux disease) 10/12/2011   Past Medical History:  Diagnosis Date   Anxiety    Bipolar affective psychosis ([place])    Complication of anesthesia    " Acting silly- waving at everyone"-Giddy   Depression    Emphysema lung ([place])    inhaler prn,  followed by pcp and pulmonologist-- dr Margorie John   Family history of adverse reaction to anesthesia    Father had rheumatic fever, and died on operating table.   GERD (gastroesophageal reflux disease)    Migraine    Smokers' cough ([place])    per pt not productive   SUI (stress urinary incontinence, female)    Wears glasses    Past Surgical History:  Procedure Laterality Date   ABDOMINAL HYSTERECTOMY N/A    Phreesia 11/30/2019   CARPAL TUNNEL RELEASE Right 09-12-2007   @[facility]    and GANGLION CYST EXCISION   CYSTOSCOPY N/A 04/29/2019   Procedure: CYSTOSCOPY;  Surgeon: Joseph Pierini, MD;  Location: [facility];  Service: Gynecology;  Laterality: N/A;   DILATATION & CURETTAGE/HYSTEROSCOPY WITH MYOSURE N/A 11/13/2018   Procedure: DILATATION & CURETTAGE/HYSTEROSCOPY WITH MYOSURE;  Surgeon: Anastasio Auerbach, MD;  Location: [facility];  Service: Gynecology;  Laterality: N/A;  request 9:00am OR start time in [place] [facility] block requests one hour   DILATION AND CURETTAGE OF UTERUS  02/2019   EYE SURGERY N/A    Phreesia 11/30/2019   GANGLION CYST  EXCISION     R wrist   KNEE ARTHROSCOPY WITH ANTERIOR CRUCIATE LIGAMENT (ACL) REPAIR Right 10/2015   LAPAROSCOPIC CHOLECYSTECTOMY  1997   LAPAROSCOPIC HYSTERECTOMY Bilateral 04/29/2019   Procedure: HYSTERECTOMY TOTAL LAPAROSCOPIC, BILATERAL SALPINO-OOPHORECTOMY;  Surgeon: Joseph Pierini, MD;  Location: [facility];  Service: Gynecology;  Laterality: Bilateral;   TOOTH EXTRACTION  04/01/2019   Allergies  Allergen Reactions   Codeine  Anaphylaxis    Anything with codeine   Prior to Admission medications   Medication Sig Start Date End Date Taking? Authorizing Provider  albuterol (VENTOLIN HFA) 108 (90 Base) MCG/ACT inhaler Inhale 2 puffs into the lungs every 4 (four) hours as needed for wheezing or shortness of breath. 02/20/20  Yes Olalere, Adewale A, MD  ARIPiprazole (ABILIFY) 20 MG tablet Take 20 mg by mouth at bedtime. 06/22/20  Yes [provider]  aspirin-acetaminophen-caffeine (EXCEDRIN MIGRAINE) [phone] MG tablet Take 2 tablets by mouth every 6 (six) hours as needed for headache.   Yes [provider]  citalopram (CELEXA) 20 MG tablet Take 20 mg by mouth daily.   Yes [provider]  clonazePAM (KLONOPIN) 0.5 MG tablet Take 0.5 mg by mouth 2 (two) times daily. 12/04/17  Yes [provider]  esomeprazole (NEXIUM) 20 MG capsule Take 1 capsule (20 mg total) by mouth 2 (two) times daily before a meal. Patient taking differently: Take 20 mg by mouth 2 (two) times daily as needed (indigestion). 03/21/19  Yes Jacelyn Pi, Lilia Argue, MD  gabapentin (NEURONTIN) 800 MG tablet Take 800 mg by mouth 3 (three) times daily.  07/02/18  Yes [provider]  Galcanezumab-gnlm (EMGALITY) 120 MG/ML SOAJ Inject 120 mg into the skin every 30 (thirty) days. 03/24/20  Yes Marcial Pacas, MD  Glucosamine HCl (GLUCOSAMINE PO) Take 1 tablet by mouth daily as needed (arthritis).   Yes [provider]  hydrOXYzine (VISTARIL) 25 MG capsule Take 25 mg by mouth as needed. 04/20/20  Yes [provider]  imipramine (TOFRANIL) 25 MG tablet Take 25 mg by mouth daily. 06/22/20  Yes [provider]  lamoTRIgine (LAMICTAL) 150 MG tablet Take 150 mg by mouth 2 (two) times daily.    Yes [provider]  ondansetron (ZOFRAN) 4 MG tablet Take 1 tablet (4 mg total) by mouth every 8 (eight) hours as needed for nausea or vomiting. 01/05/20  Yes Suzzanne Cloud, NP  rizatriptan (MAXALT) 10 MG  tablet Take 1 tab at onset of migraine.  May repeat in 2 hrs, if needed.  Max dose: 2 tabs/day. This is a 30 day prescription. 05/31/20  Yes Wendie Agreste, MD  SUMAtriptan (IMITREX) 25 MG tablet Take 1 tablet (25 mg total) by mouth every 2 (two) hours as needed for migraine. May repeat in 2 hours if headache persists or recurs. 05/31/20  Yes Wendie Agreste, MD  tiZANidine (ZANAFLEX) 4 MG tablet Take 1 tablet (4 mg total) by mouth every 6 (six) hours as needed for muscle spasms. TAKE 1 TABLET BY MOUTH EVERY 6 HOURS AS NEEDED FOR MUSCLE SPASMS. 01/05/20  Yes Suzzanne Cloud, NP  tolterodine (DETROL LA) 4 MG 24 hr capsule Take 4 mg by mouth daily. 04/27/20  Yes [provider]   Social History   Socioeconomic History   Marital status: Divorced    Spouse name: n/a   Number of children: 2   Years of education: 18   Highest education level: Not on file  Occupational History   Occupation:  Disabled    Comment: Formerly a Pharmacist, hospital.  Tobacco Use   Smoking status: Every Day    Packs/day: 0.50    Years: 33.00    Pack years: 16.50    Types: Cigarettes    Start date: 10/10/1981    Last attempt to quit: 11/10/2019    Years since quitting: 0.6   Smokeless tobacco: Never  Vaping Use   Vaping Use: Never used  Substance and Sexual Activity   Alcohol use: Never    Alcohol/week: 0.0 standard drinks   Drug use: Never   Sexual activity: Not Currently    Partners: Male    Birth control/protection: Post-menopausal    Comment: 1st intercourse 50 yo-More than 5 partners  Other Topics Concern   Not on file  Social History Narrative   Lives at home with 1 of her two daughters   Right-handed.   2-4 cups caffeine daily.   Disabled    Social Determinants of Health   Financial Resource Strain: Not on file  Food Insecurity: Not on file  Transportation Needs: Not on file  Physical Activity: Not on file  Stress: Not on file  Social Connections: Not on file  Intimate Partner Violence: Not on  file    Review of Systems  Per HPI.  Objective:   Vitals:   07/05/20 0956  BP: 128/72  Pulse: 74  Resp: 16  Temp: 98.3 F (36.8 C)  TempSrc: Temporal  SpO2: 96%  Weight: 189 lb 3.2 oz (85.8 kg)  Height: 5\' 2"  (1.575 m)     Physical Exam Vitals reviewed.  Constitutional:      Appearance: Normal appearance. She is well-developed.  HENT:     Head: Normocephalic and atraumatic.  Eyes:     General: Gaze aligned appropriately.     Extraocular Movements: Extraocular movements intact.     Right eye: No nystagmus.     Left eye: No nystagmus.     Conjunctiva/sclera: Conjunctivae normal.     Pupils: Pupils are equal, round, and reactive to light.  Neck:     Vascular: No carotid bruit.  Cardiovascular:     Rate and Rhythm: Normal rate and regular rhythm.     Heart sounds: Normal heart sounds.  Pulmonary:     Effort: Pulmonary effort is normal.     Breath sounds: Normal breath sounds.  Abdominal:     Palpations: Abdomen is soft. There is no pulsatile mass.     Tenderness: There is no abdominal tenderness.  Musculoskeletal:     Right lower leg: No edema.     Left lower leg: No edema.  Skin:    General: Skin is warm and dry.  Neurological:     Mental Status: She is alert and oriented to person, place, and time.     GCS: GCS eye subscore is 4. GCS verbal subscore is 5. GCS motor subscore is 6.     Cranial Nerves: No dysarthria or facial asymmetry.     Motor: No weakness, tremor, abnormal muscle tone or pronator drift.     Coordination: Coordination is intact. Romberg sign negative. Coordination normal. Finger-Nose-Finger Test and Heel to [facility] Test normal. Rapid alternating movements normal.     Gait: Gait is intact. Gait normal.     Comments: Slight tenderness over the posterior scalp, just below the occiput with small bruise.  No bony step-off or crepitus.  No midline tenderness.  Nonfocal neurologic exam.  Psychiatric:        Mood and  Affect: Mood normal.         Behavior: Behavior normal.       Assessment & Plan:  Lauren Chapman is a 50 y.o. female . Contusion of scalp, initial encounter Post-traumatic headache, not intractable, unspecified chronicity pattern  -Soft tissue contusion/scalp contusion as above, appears to be improving.  Headaches are improving.  Previous history of headaches.  ER/RTC precautions if not continue to improve, handout given.  Daytime somnolence - Plan: Ambulatory referral to Sleep Studies Snoring - Plan: Ambulatory referral to Sleep Studies Sleepwalking - Plan: Ambulatory referral to Sleep Studies  -Chronic history of sleepwalking, but also with daytime somnolence, snoring,  easily falls asleep during the day.  Differential includes obstructive sleep apnea based on the reported pauses, as well as narcolepsy.  Potential medication side effects as well with polypharmacy.  Will refer to sleep specialist for further evaluation but avoid driving or operating machinery if tired at this time.  Understanding of plan expressed.  No orders of the defined types were placed in this encounter.  Patient Instructions  Contusion of the back of the head appears to be improving and I expect your headaches will also improve.  See information below.  If you have any worsening of headaches or new symptoms, I do recommend evaluation right away.  Again I expect this to continue to improve.  I will refer you to a sleep specialist to evaluate for the sleepwalking as well as possible sleep apnea or possible narcolepsy as a cause of your daytime sleepiness.  Do not drive or operate heavy machinery if you are sleepy.   Return to the clinic or go to the nearest emergency room if any of your symptoms worsen or new symptoms occur.    Signed,   Merri Ray, MD [facility] 07/05/20 10:59 AM

## 2020-07-05 NOTE — Patient Instructions (Signed)
Contusion of the back of the head appears to be improving and I expect your headaches will also improve.  See information below.  If you have any worsening of headaches or new symptoms, I do recommend evaluation right away.  Again I expect this to continue to improve.  I will refer you to a sleep specialist to evaluate for the sleepwalking as well as possible sleep apnea or possible narcolepsy as a cause of your daytime sleepiness.  Do not drive or operate heavy machinery if you are sleepy.   Return to the clinic or go to the nearest emergency room if any of your symptoms worsen or new symptoms occur.

## 2020-07-06 ENCOUNTER — Ambulatory Visit: Payer: Medicare Other | Attending: Urology | Admitting: Physical Therapy

## 2020-07-06 ENCOUNTER — Encounter: Payer: Self-pay | Admitting: Physical Therapy

## 2020-07-06 DIAGNOSIS — M62838 Other muscle spasm: Secondary | ICD-10-CM | POA: Insufficient documentation

## 2020-07-06 DIAGNOSIS — R279 Unspecified lack of coordination: Secondary | ICD-10-CM | POA: Diagnosis not present

## 2020-07-06 DIAGNOSIS — R293 Abnormal posture: Secondary | ICD-10-CM | POA: Diagnosis present

## 2020-07-06 DIAGNOSIS — M6281 Muscle weakness (generalized): Secondary | ICD-10-CM | POA: Insufficient documentation

## 2020-07-06 NOTE — Patient Instructions (Signed)
Access Code: [email or web address] URL: [email or web address] Date: 07/06/2020 Prepared by: Jari Favre  Exercises Supine Pelvic Floor Stretch - Hands on Knees - 3 x daily - 7 x weekly - 1 sets - 3 reps - 30 hold Supine Diaphragmatic Breathing - 3 x daily - 7 x weekly - 1 sets - 3 reps Seated Scapular Retraction - 3 x daily - 7 x weekly - 1 sets - 10 reps - 5 sec hold

## 2020-07-06 NOTE — Therapy (Addendum)
[facility] [address], [place], [place], [address] Phone: [phone]   Fax:  [phone]  Physical Therapy Evaluation  Patient Details  Name: Lauren Chapman MRN: [id number] Date of Birth: 09-18-1970 Referring Provider (PT): Lucas Mallow, MD   Encounter Date: 07/06/2020   PT End of Session - 07/06/20 0923     Visit Number 1    Date for PT Re-Evaluation 09/28/20    Authorization Type medicare    PT Start Time 0924    PT Stop Time 1004    PT Time Calculation (min) 40 min    Activity Tolerance Patient tolerated treatment well    Behavior During Therapy [facility] for tasks assessed/performed             Past Medical History:  Diagnosis Date   Anxiety    Bipolar affective psychosis ([place])    Complication of anesthesia    " Acting silly- waving at everyone"-Giddy   Depression    Emphysema lung ([place])    inhaler prn,  followed by pcp and pulmonologist-- dr Margorie John   Family history of adverse reaction to anesthesia    Father had rheumatic fever, and died on operating table.   GERD (gastroesophageal reflux disease)    Migraine    Smokers' cough ([place])    per pt not productive   SUI (stress urinary incontinence, female)    Wears glasses     Past Surgical History:  Procedure Laterality Date   ABDOMINAL HYSTERECTOMY N/A    Phreesia 11/30/2019   CARPAL TUNNEL RELEASE Right 09-12-2007   @[facility]    and GANGLION CYST EXCISION   CYSTOSCOPY N/A 04/29/2019   Procedure: CYSTOSCOPY;  Surgeon: Joseph Pierini, MD;  Location: [facility];  Service: Gynecology;  Laterality: N/A;   DILATATION & CURETTAGE/HYSTEROSCOPY WITH MYOSURE N/A 11/13/2018   Procedure: DILATATION & CURETTAGE/HYSTEROSCOPY WITH MYOSURE;  Surgeon: Anastasio Auerbach, MD;  Location: [facility];  Service: Gynecology;  Laterality: N/A;  request 9:00am OR start time in [place] block requests one hour   [place]  02/2019   EYE SURGERY N/A    Phreesia 11/30/2019   GANGLION CYST EXCISION     R wrist   KNEE ARTHROSCOPY WITH ANTERIOR CRUCIATE LIGAMENT (ACL) REPAIR Right 10/2015   LAPAROSCOPIC CHOLECYSTECTOMY  1997   LAPAROSCOPIC HYSTERECTOMY Bilateral 04/29/2019   Procedure: HYSTERECTOMY TOTAL LAPAROSCOPIC, BILATERAL SALPINO-OOPHORECTOMY;  Surgeon: Joseph Pierini, MD;  Location: [facility];  Service: Gynecology;  Laterality: Bilateral;   TOOTH EXTRACTION  04/01/2019    There were no vitals filed for this visit.    Subjective Assessment - 07/06/20 0940     Subjective Pt states she has been haivng the leakage worse over the last 2 years as well as    Currently in Pain? Yes    Pain Score 6     Pain Location Bladder    Pain Orientation Lower;Mid    Pain Descriptors / Indicators Sore    Pain Type Chronic pain    Pain Onset More than a month ago    Pain Frequency Intermittent    Aggravating Factors  full bladder and trying to empty bladder    Multiple Pain Sites No                [facility] PT Assessment - 07/06/20 0001       Assessment   Medical Diagnosis R35.0 (ICD-10-CM) - Frequency of micturition;R39.15 (ICD-10-CM) -  Urgency of urination;N39.46 (ICD-10-CM) - Mixed incontinence    Referring Provider (PT) Lucas Mallow, MD    Onset Date/Surgical Date --   after children then worse in last 2 years, fecal 1 year ago   Prior Therapy No      Precautions   Precautions None      Balance Screen   Has the patient fallen in the past 6 months Yes    How many times? 1   sleepwalking last week   Has the patient had a decrease in activity level because of a fear of falling?  No    Is the patient reluctant to leave their home because of a fear of falling?  No      Home Environment   Living Environment Private residence    Living Arrangements Alone      Prior Function   Level of Independence Independent      Cognition   Overall Cognitive Status  Within Functional Limits for tasks assessed      Posture/Postural Control   Posture/Postural Control Postural limitations    Postural Limitations Rounded Shoulders;Increased lumbar lordosis;Increased thoracic kyphosis;Anterior pelvic tilt      ROM / Strength   AROM / PROM / Strength AROM;Strength;PROM      AROM   Overall AROM Comments lumbar flexion 90%, thoracic extension 70%      PROM   Overall PROM Comments Rt hip ER 70%; Lt ER 80%      Strength   Overall Strength Comments Rt hip abduction and adduction 4/5      Flexibility   Soft Tissue Assessment /Muscle Length yes    Hamstrings WNL      Palpation   Palpation comment lumbar and gluteal tight                        Objective measurements completed on examination: See above findings.     Pelvic Floor Special Questions - 07/06/20 0001     Prior Pelvic/Prostate Exam Yes    Are you Pregnant or attempting pregnancy? No    Prior Pregnancies Yes    Number of Pregnancies 3    Number of Vaginal Deliveries 2    Any difficulty with labor and deliveries Yes   foreceps   Episiotomy Performed Yes    Currently Sexually Active No    Urinary Leakage Yes    How often all day    Pad use 2-3 depends per day    Activities that cause leaking With strong urge;Coughing;Sneezing;Laughing   all the time   Urinary urgency Yes    Urinary frequency every hour; feel like not emptying    Fecal incontinence Yes   1-2/week don't feel and liquidy   Fluid intake 3-4x 16 oz    Falling out feeling (prolapse) Yes    Activities that cause feeling of prolapse 1-2/day when bladder is really full    Skin Integrity Erthema    Prolapse None    Pelvic Floor Internal Exam pt identity confimred and informed consent given    Exam Type Vaginal    Palpation tight Lt >Rt levators    Strength Flicker    Strength # of reps --   4 quick flicks   Strength # of seconds 2   holding breath   Tone high                      PT  Education - 07/06/20  1204     Education Details Access Code: [email or web address]    Person(s) Educated Patient    Methods Explanation;Demonstration;Tactile cues;Verbal cues;Handout    Comprehension Verbalized understanding;Returned demonstration               PT LONG TERM GOAL #1    Title Patient will be I with final HEP to maintain progress from PT    Time 12    Period Weeks    Status New    Target Date 09/28/20         PT LONG TERM GOAL #2    Title Pt will be able to fully empty bladder    Time 12    Period Weeks    Status New    Target Date 09/28/20         PT LONG TERM GOAL #3    Title Pt will report 75% less leakage    Baseline full bladder incontinence when coughing    Time 12    Period Weeks    Status New    Target Date 09/28/20         PT LONG TERM GOAL #4    Title Pt will be able to contract and hold pelvic floor for at least 10 seconds for endurance during physical activities    Time 12    Period Weeks    Status New    Target Date 09/28/20         PT LONG TERM GOAL #5    Title Pt will demonstrate 3/5 MMT of pelvic floor in order to prevent leakage when coughing    Time 12    Period Weeks    Status New    Target Date 09/28/20               Plan - 07/06/20 1011     Clinical Impression Statement Pt presents to clinic due to incontinence of bowel and bladder with urinary incontinence being more prevalent.  Pt demonstrates posture abnormalities.  Tight and reduced hip ER Rt>Lt.  Pt has tight lumbar paraspinals and pecs with upper and lower cross.  Pt has Rt hip MMT 4/5 abduction and adduction.  Pt tolerated internal assessment without pain, but is higher tone on the Lt side levators.  Pt has 1/5 MMT and only holding for 2 seconds.  Pt able to do 4 quick flicks. Overall, high tone related weakness and she was holding her breath in order to contract the pelvic floor muscles.  pt will benefit from skilled PT to address impairments and return to maximum function.     Personal Factors and Comorbidities Time since onset of injury/illness/exacerbation;Comorbidity 3+    Comorbidities hysterectomy 2021, emphysema, GERD, depression, anxiety, bipolar, chronic migrain    Examination-Activity Limitations Continence;Toileting    Examination-Participation Restrictions Community Activity;Church    Stability/Clinical Decision Making Evolving/Moderate complexity    Clinical Decision Making Moderate    Rehab Potential Excellent    PT Frequency 1x / week    PT Duration 12 weeks    PT Treatment/Interventions ADLs/Self Care Home Management;Biofeedback;Cryotherapy;Electrical Stimulation;Moist Heat;Therapeutic activities;Therapeutic exercise;Neuromuscular re-education;Manual techniques;Patient/family education;Taping;Passive range of motion;Dry needling    PT Next Visit Plan internal STM, self massage, bulge and relax, posture    PT Home Exercise Plan Access Code: [email or web address]    Consulted and Agree with Plan of Care Patient             Patient will benefit from skilled therapeutic intervention in order to improve the following deficits  and impairments:  Abnormal gait, Decreased strength, Decreased range of motion, Decreased coordination, Decreased endurance, Increased muscle spasms, Postural dysfunction, Pain  Visit Diagnosis: Unspecified lack of coordination  Muscle weakness (generalized)  Abnormal posture     Problem List Patient Active Problem List   Diagnosis Date Noted   Depression 03/24/2020   Bilateral low back pain with left-sided sciatica 03/24/2020   Left lumbar radiculopathy 02/25/2020   Absence of bladder continence 02/25/2020   History of postmenopausal bleeding 04/29/2019   Polypharmacy 04/03/2019   Emphysema, unspecified ([place]) 05/17/2018   Weakness 01/25/2017   Chronic migraine w/o aura w/o status migrainosus, not intractable 12/22/2014   Encounter for other general counseling or advice on contraception 09/02/2014   Bipolar disorder ([place])  10/12/2011   Anxiety and depression 10/12/2011   GERD (gastroesophageal reflux disease) 10/12/2011    Jule Ser, PT 07/06/2020, 12:18 PM  [facility] [address], [place], [place], [address] Phone: [phone]   Fax:  [phone]  Name: Lauren Chapman MRN: [id number] Date of Birth: 09/03/1970

## 2020-07-08 ENCOUNTER — Encounter: Payer: Self-pay | Admitting: Gastroenterology

## 2020-07-08 ENCOUNTER — Ambulatory Visit (AMBULATORY_SURGERY_CENTER): Payer: Medicare Other | Admitting: Gastroenterology

## 2020-07-08 ENCOUNTER — Other Ambulatory Visit: Payer: Self-pay

## 2020-07-08 VITALS — BP 121/60 | HR 66 | Temp 97.3°F | Resp 16 | Ht 62.0 in | Wt 191.0 lb

## 2020-07-08 DIAGNOSIS — D122 Benign neoplasm of ascending colon: Secondary | ICD-10-CM

## 2020-07-08 DIAGNOSIS — K635 Polyp of colon: Secondary | ICD-10-CM | POA: Diagnosis not present

## 2020-07-08 DIAGNOSIS — K625 Hemorrhage of anus and rectum: Secondary | ICD-10-CM | POA: Diagnosis not present

## 2020-07-08 DIAGNOSIS — D126 Benign neoplasm of colon, unspecified: Secondary | ICD-10-CM

## 2020-07-08 DIAGNOSIS — R159 Full incontinence of feces: Secondary | ICD-10-CM

## 2020-07-08 DIAGNOSIS — K621 Rectal polyp: Secondary | ICD-10-CM

## 2020-07-08 DIAGNOSIS — D128 Benign neoplasm of rectum: Secondary | ICD-10-CM

## 2020-07-08 HISTORY — DX: Benign neoplasm of colon, unspecified: D12.6

## 2020-07-08 MED ORDER — SODIUM CHLORIDE 0.9 % IV SOLN: 500.0000 mL | Freq: Once | INTRAVENOUS | Status: DC

## 2020-07-08 NOTE — Op Note (Signed)
[place] Patient Name: Lauren Chapman Procedure Date: 07/08/2020 1:40 PM MRN: [id number] Endoscopist: Ladene Artist , MD Age: 50 Referring MD:  Date of Birth: [birth year] Gender: Female Account #: [id number] Procedure:                Colonoscopy Indications:              Hematochezia, Fecal incontinence Medicines:                Monitored Anesthesia Care Procedure:                Pre-Anesthesia Assessment:                           - Prior to the procedure, a History and Physical                            was performed, and patient medications and                            allergies were reviewed. The patient's tolerance of                            previous anesthesia was also reviewed. The risks                            and benefits of the procedure and the sedation                            options and risks were discussed with the patient.                            All questions were answered, and informed consent                            was obtained. Prior Anticoagulants: The patient has                            taken no previous anticoagulant or antiplatelet                            agents. ASA Grade Assessment: III - A patient with                            severe systemic disease. After reviewing the risks                            and benefits, the patient was deemed in                            satisfactory condition to undergo the procedure.                           After obtaining informed consent, the colonoscope  was passed under direct vision. Throughout the                            procedure, the patient's blood pressure, pulse, and                            oxygen saturations were monitored continuously. The                            Colonoscope was introduced through the anus and                            advanced to the the cecum, identified by                            appendiceal orifice and ileocecal  valve. The                            ileocecal valve, appendiceal orifice, and rectum                            were photographed. The quality of the bowel                            preparation was excellent. The colonoscopy was                            performed without difficulty. The patient tolerated                            the procedure well. Scope In: 1:58:09 PM Scope Out: 2:15:48 PM Scope Withdrawal Time: 0 hours 14 minutes 36 seconds  Total Procedure Duration: 0 hours 17 minutes 39 seconds  Findings:                 The perianal and digital rectal examinations were                            normal.                           Three sessile polyps were found in the rectum (2)                            and ascending colon (1). The polyps were 6 to 7 mm                            in size. These polyps were removed with a cold                            snare. Resection and retrieval were complete.                           Scattered medium-mouthed diverticula were found in  the right colon. There was no evidence of                            diverticular bleeding.                           Multiple small-mouthed diverticula were found in                            the left colon. There was evidence of diverticular                            spasm. Erythema was seen in association with the                            diverticular opening. There was no evidence of                            diverticular bleeding.                           The exam was otherwise without abnormality on                            direct and retroflexion views. Complications:            No immediate complications. Estimated blood loss:                            None. Estimated Blood Loss:     Estimated blood loss: none. Impression:               - Three 6 to 7 mm polyps in the rectum and in the                            ascending colon, removed with a cold snare.                             Resected and retrieved.                           - Mild diverticulosis in the right colon.                           - Mild diverticulosis in the left colon.                           - The examination was otherwise normal on direct                            and retroflexion views. Recommendation:           - Repeat colonoscopy after studies are complete for                            surveillance based on pathology results.                           -  Patient has a contact number available for                            emergencies. The signs and symptoms of potential                            delayed complications were discussed with the                            patient. Return to normal activities tomorrow.                            Written discharge instructions were provided to the                            patient.                           - High fiber diet.                           - Continue present medications.                           - Await pathology results. Ladene Artist, MD 07/08/2020 2:22:18 PM This report has been signed electronically.

## 2020-07-08 NOTE — Patient Instructions (Signed)
Handouts provided on polyps, diverticulosis and high-fiber diet.   Recommend a High-fiber diet.   YOU HAD AN ENDOSCOPIC PROCEDURE TODAY AT [facility]:   Refer to the procedure report that was given to you for any specific questions about what was found during the examination.  If the procedure report does not answer your questions, please call your gastroenterologist to clarify.  If you requested that your care partner not be given the details of your procedure findings, then the procedure report has been included in a sealed envelope for you to review at your convenience later.  YOU SHOULD EXPECT: Some feelings of bloating in the abdomen. Passage of more gas than usual.  Walking can help get rid of the air that was put into your GI tract during the procedure and reduce the bloating. If you had a lower endoscopy (such as a colonoscopy or flexible sigmoidoscopy) you may notice spotting of blood in your stool or on the toilet paper. If you underwent a bowel prep for your procedure, you may not have a normal bowel movement for a few days.  Please Note:  You might notice some irritation and congestion in your nose or some drainage.  This is from the oxygen used during your procedure.  There is no need for concern and it should clear up in a day or so.  SYMPTOMS TO REPORT IMMEDIATELY:  Following lower endoscopy (colonoscopy or flexible sigmoidoscopy):  Excessive amounts of blood in the stool  Significant tenderness or worsening of abdominal pains  Swelling of the abdomen that is new, acute  Fever of 100F or higher  For urgent or emergent issues, a gastroenterologist can be reached at any hour by calling [phone]. Do not use MyChart messaging for urgent concerns.    DIET:  We do recommend a small meal at first, but then you may proceed to your regular diet.  Drink plenty of fluids but you should avoid alcoholic beverages for 24 hours.  ACTIVITY:  You should plan to take it  easy for the rest of today and you should NOT DRIVE or use heavy machinery until tomorrow (because of the sedation medicines used during the test).    FOLLOW UP: Our staff will call the number listed on your records 48-72 hours following your procedure to check on you and address any questions or concerns that you may have regarding the information given to you following your procedure. If we do not reach you, we will leave a message.  We will attempt to reach you two times.  During this call, we will ask if you have developed any symptoms of COVID 19. If you develop any symptoms (ie: fever, flu-like symptoms, shortness of breath, cough etc.) before then, please call [phone].  If you test positive for Covid 19 in the 2 weeks post procedure, please call and report this information to [place].    If any biopsies were taken you will be contacted by phone or by letter within the next 1-3 weeks.  Please call us at [phone] if you have not heard about the biopsies in 3 weeks.    SIGNATURES/CONFIDENTIALITY: You and/or your care partner have signed paperwork which will be entered into your electronic medical record.  These signatures attest to the fact that that the information above on your After Visit Summary has been reviewed and is understood.  Full responsibility of the confidentiality of this discharge information lies with you and/or your care-partner.

## 2020-07-08 NOTE — Progress Notes (Signed)
Report to PACU, RN, vss, BBS= Clear.  

## 2020-07-08 NOTE — Progress Notes (Signed)
Called to room to assist during endoscopic procedure.  Patient ID and intended procedure confirmed with present staff. Received instructions for my participation in the procedure from the performing physician.  

## 2020-07-12 ENCOUNTER — Telehealth: Payer: Self-pay

## 2020-07-12 NOTE — Telephone Encounter (Signed)
  Follow up Call-  Call back number 07/08/2020  Post procedure Call Back phone  # [phone]  Permission to leave phone message Yes  Some recent data might be hidden     Patient questions:  Do you have a fever, pain , or abdominal swelling? No. Pain Score  0 *  Have you tolerated food without any problems? Yes.    Have you been able to return to your normal activities? Yes.    Do you have any questions about your discharge instructions: Diet   No. Medications  No. Follow up visit  No.  Do you have questions or concerns about your Care? Yes.    Actions: * If pain score is 4 or above: No action needed, pain <4.

## 2020-07-13 ENCOUNTER — Other Ambulatory Visit: Payer: Self-pay

## 2020-07-13 ENCOUNTER — Ambulatory Visit: Payer: Medicare Other | Admitting: Physical Therapy

## 2020-07-13 ENCOUNTER — Encounter: Payer: Self-pay | Admitting: Physical Therapy

## 2020-07-13 DIAGNOSIS — M62838 Other muscle spasm: Secondary | ICD-10-CM

## 2020-07-13 DIAGNOSIS — R293 Abnormal posture: Secondary | ICD-10-CM

## 2020-07-13 DIAGNOSIS — R279 Unspecified lack of coordination: Secondary | ICD-10-CM

## 2020-07-13 DIAGNOSIS — M6281 Muscle weakness (generalized): Secondary | ICD-10-CM

## 2020-07-13 NOTE — Therapy (Signed)
[facility] [address] [place], [place], [address] Phone: [phone]   Fax:  [phone]  Physical Therapy Treatment  Patient Details  Name: Lauren Chapman MRN: [id number] Date of Birth: 08/23/1970 Referring Provider (PT): Lucas Mallow, MD   Encounter Date: 07/13/2020   PT End of Session - 07/13/20 0807     Visit Number 2    Date for PT Re-Evaluation 09/28/20    Authorization Type medicare    PT Start Time 0804    PT Stop Time 0844    PT Time Calculation (min) 40 min    Activity Tolerance Patient tolerated treatment well    Behavior During Therapy [facility] for tasks assessed/performed             Past Medical History:  Diagnosis Date   Allergy    Anxiety    Bipolar affective psychosis ([place])    Complication of anesthesia    " Acting silly- waving at everyone"-[place]   Depression    Emphysema lung ([place])    inhaler prn,  followed by pcp and pulmonologist-- dr Margorie John   Family history of adverse reaction to anesthesia    Father had rheumatic fever, and died on operating table.   GERD (gastroesophageal reflux disease)    Kidney cysts 01/24/2020   Right Kidney   Migraine    Smokers' cough ([place])    per pt not productive   SUI (stress urinary incontinence, female)    Wears glasses     Past Surgical History:  Procedure Laterality Date   ABDOMINAL HYSTERECTOMY N/A    [employer] 11/30/2019   CARPAL TUNNEL RELEASE Right 09-12-2007   @[facility]    and GANGLION CYST EXCISION   CYSTOSCOPY N/A 04/29/2019   Procedure: CYSTOSCOPY;  Surgeon: Joseph Pierini, MD;  Location: [place];  Service: Gynecology;  Laterality: N/A;   DILATATION & CURETTAGE/HYSTEROSCOPY WITH MYOSURE N/A 11/13/2018   Procedure: DILATATION & CURETTAGE/HYSTEROSCOPY WITH MYOSURE;  Surgeon: Anastasio Auerbach, MD;  Location: [place];  Service: Gynecology;  Laterality: N/A;  request 9:00am OR start time in  [place] block requests one hour   [place]  02/2019   EYE SURGERY N/A    [employer]reesia 11/30/2019   GANGLION CYST EXCISION     R wrist   KNEE ARTHROSCOPY WITH ANTERIOR CRUCIATE LIGAMENT (ACL) REPAIR Right 10/2015   LAPAROSCOPIC CHOLECYSTECTOMY  1997   LAPAROSCOPIC HYSTERECTOMY Bilateral 04/29/2019   Procedure: HYSTERECTOMY TOTAL LAPAROSCOPIC, BILATERAL SALPINO-OOPHORECTOMY;  Surgeon: Joseph Pierini, MD;  Location: [place];  Service: Gynecology;  Laterality: Bilateral;   TOOTH EXTRACTION  04/01/2019    There were no vitals filed for this visit.   Subjective Assessment - 07/13/20 0818     Subjective Pt states she has been compliant with the exercises    Currently in Pain? No/denies                               [facility] Adult PT Treatment/Exercise - 07/13/20 0001       Self-Care   Self-Care Other Self-Care Comments    Other Self-Care Comments  self massage and desert harvest samples with instructions for skin around gluteal fold      Manual Therapy   Manual Therapy Soft tissue mobilization;Internal Pelvic Floor    Manual therapy comments pt informed and consent given - identity confirmed for internal STM  Soft tissue mobilization lumbar paraspinals    Internal Pelvic Floor levators and ischiocavernosis                    PT Education - 07/13/20 0844     Education Details cream for irritated skin, stretch canal, foam noodle massage    Person(s) Educated Patient    Methods Explanation;Demonstration;Verbal cues;Handout;Tactile cues    Comprehension Verbalized understanding;Returned demonstration                        Plan - 07/13/20 0850     Clinical Impression Statement Pt had improved muscle strength of pelvic floor from 1/5 to 2/5 after STM performed today.  She was able to relax with breathing and was educated in how to do these techniques at home for improved muscle funciton.  pt  did not meet goals yet as it was initial f/u treatment.    PT Treatment/Interventions ADLs/Self Care Home Management;Biofeedback;Cryotherapy;Electrical Stimulation;Moist Heat;Therapeutic activities;Therapeutic exercise;Neuromuscular re-education;Manual techniques;Patient/family education;Taping;Passive range of motion;Dry needling    PT Next Visit Plan f/u on self massage, give desrert harvest she forgot, stretches and basic kegel and core    PT Home Exercise Plan Access Code: [email or web address]    Consulted and Agree with Plan of Care Patient             Patient will benefit from skilled therapeutic intervention in order to improve the following deficits and impairments:  Abnormal gait, Decreased strength, Decreased range of motion, Decreased coordination, Decreased endurance, Increased muscle spasms, Postural dysfunction, Pain  Visit Diagnosis: Unspecified lack of coordination  Muscle weakness (generalized)  Abnormal posture  Other muscle spasm     Problem List Patient Active Problem List   Diagnosis Date Noted   Depression 03/24/2020   Bilateral low back pain with left-sided sciatica 03/24/2020   Left lumbar radiculopathy 02/25/2020   Absence of bladder continence 02/25/2020   History of postmenopausal bleeding 04/29/2019   Polypharmacy 04/03/2019   Emphysema, unspecified ([place]) 05/17/2018   Weakness 01/25/2017   Chronic migraine w/o aura w/o status migrainosus, not intractable 12/22/2014   Encounter for other general counseling or advice on contraception 09/02/2014   Bipolar disorder ([place]) 10/12/2011   Anxiety and depression 10/12/2011   GERD (gastroesophageal reflux disease) 10/12/2011    Camillo Flaming Lorana Maffeo, PT 07/13/2020, 8:52 AM  [facility] [address], [place], [place], [address] Phone: [phone]   Fax:  [phone]  Name: Lauren Chapman MRN: [id number] Date of Birth: 10/18/1970

## 2020-07-13 NOTE — Patient Instructions (Signed)
STRETCHING THE PELVIC FLOOR MUSCLES NO DILATOR  Supplies Vaginal lubricant Mirror (optional) Gloves (optional) or clean hands Positioning Start in a semi-reclined position with your head propped up. Bend your knees and place your thumb or finger at the vaginal opening. Procedure Apply a moderate amount of lubricant on the outer skin of your vagina, the labia minora.  Apply additional lubricant to your finger. Spread the skin away from the vaginal opening. Place the end of your finger at the opening. Do a maximum contraction of the pelvic floor muscles. Tighten the vagina and the anus maximally and relax. When you know they are relaxed, gently and slowly insert your finger into your vagina, directing your finger slightly downward, for 2-3 inches of insertion. Relax and stretch the 6 o'clock position Hold each stretch for _30-60 seconds, no pain more than 3/10 Repeat the stretching in the 4 o'clock and 8 o'clock positions. Next gently move your finger in a "U" shape  several times.  You can also enter a second finger to work to spread the vaginal opening wider from 3:00-6:00 and 6:00-9:00 or 3:00-9:00 Perform daily or every other day Once you have accomplished the techniques you may try them in standing with one foot resting on the tub, or in other positions.  This is a good stretch to do in the shower if you don't need to use lubricant.     Foam noodle or tightly rolled towel and sit on to relax the muscles

## 2020-07-14 ENCOUNTER — Encounter: Payer: Self-pay | Admitting: Gastroenterology

## 2020-07-20 ENCOUNTER — Ambulatory Visit: Payer: Medicare Other | Admitting: Physical Therapy

## 2020-07-20 ENCOUNTER — Telehealth: Payer: Self-pay | Admitting: Neurology

## 2020-07-20 ENCOUNTER — Other Ambulatory Visit: Payer: Self-pay

## 2020-07-20 ENCOUNTER — Encounter: Payer: Self-pay | Admitting: Physical Therapy

## 2020-07-20 DIAGNOSIS — R293 Abnormal posture: Secondary | ICD-10-CM

## 2020-07-20 DIAGNOSIS — M62838 Other muscle spasm: Secondary | ICD-10-CM

## 2020-07-20 DIAGNOSIS — R279 Unspecified lack of coordination: Secondary | ICD-10-CM | POA: Diagnosis not present

## 2020-07-20 DIAGNOSIS — M6281 Muscle weakness (generalized): Secondary | ICD-10-CM

## 2020-07-20 NOTE — Addendum Note (Signed)
Addended by: Su Hoff on: 07/20/2020 09:18 AM   Modules accepted: Orders

## 2020-07-20 NOTE — Telephone Encounter (Signed)
Dr. Krista Blue has ordered IV Depacon 500mg  x 2 and IV Toradol 30mg  x 1. The patient has ondansetron she can take at home for nausea. The signed orders have been given to [facility]. The patient will be here at 11:30am for her infusion.

## 2020-07-20 NOTE — Telephone Encounter (Signed)
Pt called, have had a migraine for 2 wks. Can I come in to get an injection? Would like a call from the nurse.

## 2020-07-20 NOTE — Patient Instructions (Addendum)
Double Voiding can be a very useful technique to help overcome incomplete emptying of your bladder.  Incomplete emptying of urine can result in leakage after using the bathroom and increase the risk of urinary tract infection.   Initial Void: When you first sit down to urinate, ensure optimal positioning for bladder emptying by following these guidelines for toileting posture: Sit on the toilet seat - don't hover over the seat Support your trunk by placing your hands on your knees or thighs Spread your knees and hips wide Position your feet flat on the floor or elevate feet on phone books, foot stool (Squatty Potty), or wrapped toilet paper rolls (if having knees above hips helps you empty) Lean forward from your hips Maintain the normal inward curve in your lower back   Repeated Void: After your initial void is complete, follow these movement patterns and attempt going to the bathroom again. Stand up Rotate your hips as if doing hula hoop in one direction Rotate using the same action in the other direction Rock your hips and pelvis back and forwards ("pelvic tilts") Rock your hips and pelvis side to side ("tail wag") Sit back down and repeat your voiding technique This technique can be repeated as many times as you choose to help you empty your bladder more effectively.   Access Code: [email or web address] URL: [email or web address] Date: 07/20/2020 Prepared by: Jari Favre  Exercises Supine Pelvic Floor Stretch - Hands on Knees - 3 x daily - 7 x weekly - 1 sets - 3 reps - 30 hold Supine Diaphragmatic Breathing - 3 x daily - 7 x weekly - 1 sets - 3 reps Seated Scapular Retraction - 3 x daily - 7 x weekly - 1 sets - 10 reps - 5 sec hold Standing Low Back Flexion at Table - 3 x daily - 7 x weekly - 1 sets - 8 reps Supine Butterfly Groin Stretch - 1 x daily - 7 x weekly - 3 reps - 1 sets - 30 sec hold Supine Hip Internal and External Rotation - 1 x daily - 7 x weekly - 10  reps - 1 sets - 5 sec hold Supine Hip Adductor Squeeze with Small Ball - 3 x daily - 7 x weekly - 1 sets - 10 reps - 3 sec hold

## 2020-07-20 NOTE — Therapy (Addendum)
[facility] [address], [place], [place], [address] Phone: [phone]   Fax:  [phone]  Physical Therapy Treatment  Patient Details  Name: Lauren Chapman MRN: [id number] Date of Birth: [birth year] Referring Provider (PT): Lucas Mallow, MD   Encounter Date: 07/20/2020   PT End of Session - 07/20/20 0802     Visit Number 3    Date for PT Re-Evaluation 09/28/20    Authorization Type medicare    PT Start Time 0802    PT Stop Time 0843    PT Time Calculation (min) 41 min    Activity Tolerance Patient tolerated treatment well    Behavior During Therapy [facility] for tasks assessed/performed             Past Medical History:  Diagnosis Date   Allergy    Anxiety    Bipolar affective psychosis ([place])    Complication of anesthesia    " Acting silly- waving at everyone"-Giddy   Depression    Emphysema lung ([place])    inhaler prn,  followed by pcp and pulmonologist-- dr Margorie John   Family history of adverse reaction to anesthesia    Father had rheumatic fever, and died on operating table.   GERD (gastroesophageal reflux disease)    Kidney cysts 01/24/2020   Right Kidney   Migraine    Smokers' cough ([place])    per pt not productive   SUI (stress urinary incontinence, female)    Wears glasses     Past Surgical History:  Procedure Laterality Date   ABDOMINAL HYSTERECTOMY N/A    Phreesia 11/30/2019   CARPAL TUNNEL RELEASE Right 09-12-2007   @[facility]    and GANGLION CYST EXCISION   CYSTOSCOPY N/A 04/29/2019   Procedure: CYSTOSCOPY;  Surgeon: Joseph Pierini, MD;  Location: [place];  Service: Gynecology;  Laterality: N/A;   DILATATION & CURETTAGE/HYSTEROSCOPY WITH MYOSURE N/A 11/13/2018   Procedure: DILATATION & CURETTAGE/HYSTEROSCOPY WITH MYOSURE;  Surgeon: Anastasio Auerbach, MD;  Location: [place];  Service: Gynecology;  Laterality: N/A;  request 9:00am OR start time in  [place] block requests one hour   [place]  02/2019   EYE SURGERY N/A    Phreesia 11/30/2019   GANGLION CYST EXCISION     R wrist   KNEE ARTHROSCOPY WITH ANTERIOR CRUCIATE LIGAMENT (ACL) REPAIR Right 10/2015   LAPAROSCOPIC CHOLECYSTECTOMY  1997   LAPAROSCOPIC HYSTERECTOMY Bilateral 04/29/2019   Procedure: HYSTERECTOMY TOTAL LAPAROSCOPIC, BILATERAL SALPINO-OOPHORECTOMY;  Surgeon: Joseph Pierini, MD;  Location: [place];  Service: Gynecology;  Laterality: Bilateral;   TOOTH EXTRACTION  04/01/2019    There were no vitals filed for this visit.   Subjective Assessment - 07/20/20 0829     Subjective Pt states leakage has been better but not sure if I am doing the massage correctly    Patient Stated Goals stop having leakage    Currently in Pain? No/denies                               [facility] Adult PT Treatment/Exercise - 07/20/20 0001       Self-Care   Other Self-Care Comments  toileting and double voiding      Neuro Re-ed    Neuro Re-ed Details  diaphragmatic breathing      Exercises   Exercises Lumbar      Lumbar Exercises: Stretches  Single Knee to Chest Stretch Right;Left;3 reps;20 seconds    Lower Trunk Rotation Limitations hip rotation with feet apart    Figure 4 Stretch 3 reps;20 seconds      Lumbar Exercises: Standing   Other Standing Lumbar Exercises lean on table with contract/relax pelvic floor      Lumbar Exercises: Supine   AB Set Limitations kegel with ball squeeze                    PT Education - 07/20/20 0904     Education Details Access Code: [email or web address], double void    Person(s) Educated Patient    Methods Explanation;Demonstration;Verbal cues;Handout;Tactile cues    Comprehension Verbalized understanding;Returned demonstration                 PT Long Term Goals - 07/20/20 0805       PT LONG TERM GOAL #1   Title Patient will be I with final HEP to maintain  progress from PT    Time 12    Period Weeks    Status On-going    Target Date 09/28/20      PT LONG TERM GOAL #2   Title Pt will be able to fully empty bladder    Time 12    Period Weeks    Status On-going    Target Date 09/28/20      PT LONG TERM GOAL #3   Title Pt will report 75% less leakage    Baseline full bladder incontinence when coughing    Time 12    Period Weeks    Status On-going    Target Date 09/28/20      PT LONG TERM GOAL #4   Title Pt will be able to contract and hold pelvic floor for at least 10 seconds for endurance during physical activities    Time 12    Period Weeks    Status New    Target Date 09/28/20      PT LONG TERM GOAL #5   Title Pt will demonstrate 3/5 MMT of pelvic floor in order to prevent leakage when coughing    Time 12    Period Weeks    Status New    Target Date 09/28/20                   Plan - 07/20/20 0846     Clinical Impression Statement Pt did well with stretches and exercises added today.  She reports feeling some better with leakage.  pt was able to contract pelvic floor corrrectly with cues to exhale and to take longer rest breaks between reps.  Pt needed to think a lot during the exercises due to the newness of how to correctly engage the muscles.  pt was given new exercises and stretches to continue to work on this at home so she can progress strength next session.    PT Treatment/Interventions ADLs/Self Care Home Management;Biofeedback;Cryotherapy;Electrical Stimulation;Moist Heat;Therapeutic activities;Therapeutic exercise;Neuromuscular re-education;Manual techniques;Patient/family education;Taping;Passive range of motion;Dry needling    PT Next Visit Plan f/u on HEP and double void from last session; hip flexor stretches, quadruped ex's    PT Home Exercise Plan Access Code: [email or web address]    Consulted and Agree with Plan of Care Patient             Patient will benefit from skilled therapeutic intervention in order  to improve the following deficits and impairments:  Abnormal gait, Decreased strength, Decreased range of motion, Decreased coordination,  Decreased endurance, Increased muscle spasms, Postural dysfunction, Pain  Visit Diagnosis: Unspecified lack of coordination  Muscle weakness (generalized)  Abnormal posture  Other muscle spasm     Problem List Patient Active Problem List   Diagnosis Date Noted   Depression 03/24/2020   Bilateral low back pain with left-sided sciatica 03/24/2020   Left lumbar radiculopathy 02/25/2020   Absence of bladder continence 02/25/2020   History of postmenopausal bleeding 04/29/2019   Polypharmacy 04/03/2019   Emphysema, unspecified ([place]) 05/17/2018   Weakness 01/25/2017   Chronic migraine w/o aura w/o status migrainosus, not intractable 12/22/2014   Encounter for other general counseling or advice on contraception 09/02/2014   Bipolar disorder ([place]) 10/12/2011   Anxiety and depression 10/12/2011   GERD (gastroesophageal reflux disease) 10/12/2011    Jule Ser, PT 07/20/2020, 9:19 AM  [facility] [address] [place], [place], [address] Phone: [phone]   Fax:  [phone]  Name: GABRIEL PAULDING MRN: [id number] Date of Birth: 1970/09/20

## 2020-07-20 NOTE — Telephone Encounter (Signed)
I spoke to the patient. Reports migraine for two weeks that has been unresponsive to repeat trials w/ home meds. She would like to come in for an infusion. She confirmed her only allergy is codeine. She does not have a driver available. She is aware this will alter the medications we can offer.

## 2020-07-21 ENCOUNTER — Encounter: Payer: Self-pay | Admitting: Neurology

## 2020-07-21 ENCOUNTER — Ambulatory Visit (INDEPENDENT_AMBULATORY_CARE_PROVIDER_SITE_OTHER): Payer: Medicare Other | Admitting: Neurology

## 2020-07-21 VITALS — BP 114/75 | HR 81 | Ht 63.0 in | Wt 188.0 lb

## 2020-07-21 DIAGNOSIS — E669 Obesity, unspecified: Secondary | ICD-10-CM

## 2020-07-21 DIAGNOSIS — Z79899 Other long term (current) drug therapy: Secondary | ICD-10-CM

## 2020-07-21 DIAGNOSIS — R400 Somnolence: Secondary | ICD-10-CM | POA: Diagnosis not present

## 2020-07-21 DIAGNOSIS — R0683 Snoring: Secondary | ICD-10-CM

## 2020-07-21 DIAGNOSIS — R0681 Apnea, not elsewhere classified: Secondary | ICD-10-CM | POA: Diagnosis not present

## 2020-07-21 DIAGNOSIS — G4750 Parasomnia, unspecified: Secondary | ICD-10-CM

## 2020-07-21 DIAGNOSIS — E66811 Obesity, class 1: Secondary | ICD-10-CM

## 2020-07-21 NOTE — Progress Notes (Signed)
Subjective:    Patient ID: Lauren Chapman is a 50 y.o. female.  HPI    Star Age, MD, PhD [facility] [address] [place], [place] [address]  Dear Dr. Carlota Raspberry,  I saw your patient, Lauren Chapman, upon your kind request in my today for initial consultation of her sleep disorder, in particular, concern for underlying obstructive sleep apnea and history of sleepwalking since childhood.  The patient is unaccompanied today.  As you know, Lauren Chapman is a 50 year old right-handed woman with an underlying medical history of allergies, anxiety, depression, emphysema, reflux disease, kidney cyst, migraine headaches (for which she is followed by my colleague Dr. Krista Blue), and obesity, who reports snoring and excessive daytime somnolence as well as witnessed apneas.  She reports a longstanding history of sleepwalking since childhood.  She recently had a fall as she got out of bed.  She hit her head.  She was evaluated in the emergency room and had a head CT and cervical spine CT without contrast on 06/30/2020 and I reviewed the results: IMPRESSION: No acute intracranial abnormality seen.   Multilevel degenerative changes are noted. No acute abnormality seen in the cervical spine.   I reviewed your office note from 07/05/2020. Her Epworth sleepiness score is 20 out of 24, fatigue severity score is 49 out of 63.  Of note, she was on multiple potentially sedating medications including Abilify, Celexa, clonazepam, Neurontin, Vistaril, Tofranil, Lamictal, Remeron.  She is followed by psychiatry.  She was in inpatient care and is now followed as an outpatient.  She lives alone, her youngest daughter recently moved out.  She reports a longstanding history of sleepwalking, sleep talking, and sleep-related eating since childhood.  She has fallen while sleepwalking.  She tries to wake herself up but cannot do so and ends up falling and has hurt herself including her right knee  some years ago.  She falls asleep quickly and has been sleepy at the wheel.  She currently does not work.  Bedtime is generally between 930 and 10 and rise time around 5 AM.  She has been told by her daughters that she makes choking sounds and pauses while asleep.  She has woken up with a sense of gasping or snorting.  She has occasionally woken up with a headache.  She does not have any night to night nocturia, is not aware of any family history of sleep apnea.  Weight has been fluctuating.  She smokes about half a pack per day, does not drink any alcohol and drinks caffeine in the form of coffee, about 2 cups in the mornings.  Her Past Medical History Is Significant For: Past Medical History:  Diagnosis Date   Allergy    Anxiety    Bipolar affective psychosis ([place])    Complication of anesthesia    " Acting silly- waving at everyone"-Giddy   Depression    Emphysema lung ([facility])    inhaler prn,  followed by pcp and pulmonologist-- dr Margorie John   Family history of adverse reaction to anesthesia    Father had rheumatic fever, and died on operating table.   GERD (gastroesophageal reflux disease)    Kidney cysts 01/24/2020   Right Kidney   Migraine    Smokers' cough ([place])    per pt not productive   SUI (stress urinary incontinence, female)    Wears glasses     Her Past Surgical History Is Significant For: Past Surgical History:  Procedure Laterality Date  ABDOMINAL HYSTERECTOMY N/A    Phreesia 11/30/2019   CARPAL TUNNEL RELEASE Right 09-12-2007   @[facility]    and GANGLION CYST EXCISION   CYSTOSCOPY N/A 04/29/2019   Procedure: CYSTOSCOPY;  Surgeon: Joseph Pierini, MD;  Location: [facility];  Service: Gynecology;  Laterality: N/A;   DILATATION & CURETTAGE/HYSTEROSCOPY WITH MYOSURE N/A 11/13/2018   Procedure: DILATATION & CURETTAGE/HYSTEROSCOPY WITH MYOSURE;  Surgeon: Anastasio Auerbach, MD;  Location: [facility];  Service: Gynecology;  Laterality: N/A;  request  9:00am OR start time in [place] block requests one hour   [place]  02/2019   EYE SURGERY N/A    Phreesia 11/30/2019   GANGLION CYST EXCISION     R wrist   KNEE ARTHROSCOPY WITH ANTERIOR CRUCIATE LIGAMENT (ACL) REPAIR Right 10/2015   LAPAROSCOPIC CHOLECYSTECTOMY  1997   LAPAROSCOPIC HYSTERECTOMY Bilateral 04/29/2019   Procedure: HYSTERECTOMY TOTAL LAPAROSCOPIC, BILATERAL SALPINO-OOPHORECTOMY;  Surgeon: Joseph Pierini, MD;  Location: [facility];  Service: Gynecology;  Laterality: Bilateral;   TOOTH EXTRACTION  04/01/2019    Her Family History Is Significant For: Family History  Problem Relation Age of Onset   Depression Mother    Cancer Mother        Cervical   Alcohol abuse Father    Crohn's disease Sister    Stroke Brother 9   Migraines Daughter    GER disease Daughter    Depression Daughter    Migraines Daughter    GER disease Daughter     Her Social History Is Significant For: Social History   Socioeconomic History   Marital status: Divorced    Spouse name: n/a   Number of children: 2   Years of education: 18   Highest education level: Not on file  Occupational History   Occupation: Disabled    Comment: Formerly a Pharmacist, hospital.  Tobacco Use   Smoking status: Every Day    Packs/day: 0.50    Years: 33.00    Pack years: 16.50    Types: Cigarettes    Start date: 10/10/1981    Last attempt to quit: 11/10/2019    Years since quitting: 0.6   Smokeless tobacco: Never  Vaping Use   Vaping Use: Never used  Substance and Sexual Activity   Alcohol use: Never    Alcohol/week: 0.0 standard drinks   Drug use: Never   Sexual activity: Not Currently    Partners: Male    Birth control/protection: Post-menopausal    Comment: 1st intercourse 50 yo-More than 5 partners  Other Topics Concern   Not on file  Social History Narrative   Lives at home with 1 of her two daughters   Right-handed.   2-4 cups caffeine daily.   Disabled     Social Determinants of Health   Financial Resource Strain: Not on file  Food Insecurity: Not on file  Transportation Needs: Not on file  Physical Activity: Not on file  Stress: Not on file  Social Connections: Not on file    Her Allergies Are:  Allergies  Allergen Reactions   Codeine Anaphylaxis    Anything with codeine  :   Her Current Medications Are:  Outpatient Encounter Medications as of 07/21/2020  Medication Sig   albuterol (VENTOLIN HFA) 108 (90 Base) MCG/ACT inhaler Inhale 2 puffs into the lungs every 4 (four) hours as needed for wheezing or shortness of breath.   ARIPiprazole (ABILIFY) 20 MG tablet Take 20 mg by mouth at bedtime.  aspirin-acetaminophen-caffeine (EXCEDRIN MIGRAINE) 250-250-65 MG tablet Take 2 tablets by mouth every 6 (six) hours as needed for headache.   citalopram (CELEXA) 20 MG tablet Take 20 mg by mouth daily.   clonazePAM (KLONOPIN) 0.5 MG tablet Take 0.5 mg by mouth 2 (two) times daily.   esomeprazole (NEXIUM) 20 MG capsule Take 1 capsule (20 mg total) by mouth 2 (two) times daily before a meal. (Patient taking differently: Take 20 mg by mouth 2 (two) times daily as needed (indigestion).)   gabapentin (NEURONTIN) 800 MG tablet Take 800 mg by mouth 3 (three) times daily.    Galcanezumab-gnlm (EMGALITY) 120 MG/ML SOAJ Inject 120 mg into the skin every 30 (thirty) days.   hydrOXYzine (VISTARIL) 25 MG capsule Take 25 mg by mouth as needed.   lamoTRIgine (LAMICTAL) 150 MG tablet Take 150 mg by mouth 2 (two) times daily.    mirtazapine (REMERON) 15 MG tablet Take 15 mg by mouth at bedtime.   ondansetron (ZOFRAN) 4 MG tablet Take 1 tablet (4 mg total) by mouth every 8 (eight) hours as needed for nausea or vomiting.   rizatriptan (MAXALT) 10 MG tablet Take 1 tab at onset of migraine.  May repeat in 2 hrs, if needed.  Max dose: 2 tabs/day. This is a 30 day prescription. (Patient taking differently: Take 1 tab at onset of migraine.  May repeat in 2 hrs, if  needed.  Max dose: 2 tabs/day. This is a 30 day prescription.)   SUMAtriptan (IMITREX) 25 MG tablet Take 1 tablet (25 mg total) by mouth every 2 (two) hours as needed for migraine. May repeat in 2 hours if headache persists or recurs.   tiZANidine (ZANAFLEX) 4 MG tablet Take 1 tablet (4 mg total) by mouth every 6 (six) hours as needed for muscle spasms. TAKE 1 TABLET BY MOUTH EVERY 6 HOURS AS NEEDED FOR MUSCLE SPASMS.   tolterodine (DETROL LA) 4 MG 24 hr capsule Take 4 mg by mouth daily.   [DISCONTINUED] Glucosamine HCl (GLUCOSAMINE PO) Take 1 tablet by mouth daily as needed (arthritis). (Patient not taking: Reported on 07/08/2020)   [DISCONTINUED] imipramine (TOFRANIL) 25 MG tablet Take 25 mg by mouth daily. (Patient not taking: Reported on 07/08/2020)   No facility-administered encounter medications on file as of 07/21/2020.  :   Review of Systems:  Out of a complete 14 point review of systems, all are reviewed and negative with the exception of these symptoms as listed below:  Review of Systems  Neurological:        Here for sleep consult. Reports she will walk, talk and eat during her sleep. Also reports snoring and daytime sleepiness.   Epworth Sleepiness Scale 0= would never doze 1= slight chance of dozing 2= moderate chance of dozing 3= high chance of dozing  Sitting and reading:3 Watching TV:3 Sitting inactive in a public place (ex. Theater or meeting):3 As a passenger in a car for an hour without a break:3 Lying down to rest in the afternoon:3 Sitting and talking to someone:1 Sitting quietly after lunch (no alcohol):3 In a car, while stopped in traffic:1 Total:20    Objective:  Neurological Exam  Physical Exam Physical Examination:   Vitals:   07/21/20 1228  BP: 114/75  Pulse: 81   General Examination: The patient is a very pleasant 50 y.o. female in no acute distress. She appears well-developed and well-nourished and well groomed.   HEENT: Normocephalic,  atraumatic, pupils are equal, round and reactive to light, extraocular tracking is  good without limitation to gaze excursion or nystagmus noted.  Corrective eyeglasses in place.  Hearing is grossly intact. Face is symmetric with normal facial animation. Speech is clear with no dysarthria noted. There is no hypophonia. There is no lip, neck/head, jaw or voice tremor. Neck is supple with full range of passive and active motion. There are no carotid bruits on auscultation. Oropharynx exam reveals: moderate mouth dryness, adequate dental hygiene with some teeth missing.  Moderate airway crowding secondary to somewhat prominent uvula, tonsillar size of about 1+ bilaterally.  Mallampati class II, neck circumference of 14 5/8 inches.  She has a mild to moderate overbite.  Tongue protrudes centrally and palate elevates symmetrically.    Chest: Clear to auscultation without wheezing, rhonchi or crackles noted.  Heart: S1+S2+0, regular and normal without murmurs, rubs or gallops noted.   Abdomen: Soft, non-tender and non-distended with normal bowel sounds appreciated on auscultation.  Extremities: There is no pitting edema in the distal lower extremities bilaterally.   Skin: Warm and dry without trophic changes noted.   Musculoskeletal: exam reveals no obvious joint deformities, tenderness or joint swelling or erythema.   Neurologically:  Mental status: The patient is awake, alert and oriented in all 4 spheres. Her immediate and remote memory, attention, language skills and fund of knowledge are appropriate. There is no evidence of aphasia, agnosia, apraxia or anomia. Speech is clear with normal prosody and enunciation. Thought process is linear. Mood is normal and affect is normal.  Cranial nerves II - XII are as described above under HEENT exam.  Motor exam: Normal bulk, strength and tone is noted. There is no tremor, fine motor skills and coordination: grossly intact.  Cerebellar testing: No dysmetria or  intention tremor. There is no truncal or gait ataxia.  Sensory exam: intact to light touch in the upper and lower extremities.  Gait, station and balance: She stands easily. No veering to one side is noted. No leaning to one side is noted. Posture is age-appropriate and stance is narrow based. Gait shows normal stride length and normal pace. No problems turning are noted.   Assessment and Plan:   In summary, Lauren Chapman is a very pleasant 50 y.o.-year old female with an underlying medical history of allergies, anxiety, depression, emphysema, reflux disease, kidney cyst, migraine headaches (for which she is followed by my colleague Dr. Krista Blue), and obesity, whose history and physical exam are concerning for obstructive sleep apnea (OSA).  Her severe daytime somnolence is likely in part related to polypharmacy with multiple psychotropic and potentially sedating medications as listed above.  She is encouraged to continue close follow-up with her psychiatrist and have them reevaluate her medication regimen.  She is also advised that unfortunately for parasomnias such as sleepwalking and sleep talking, there is no specific medication that alleviates these, most children outgrow parasomnias such as these.  It is also possible that some of her parasomnias are in part due to her medications.  I had a long chat with the patient about my findings and the diagnosis of OSA, its prognosis and treatment options. We talked about medical treatments, surgical interventions and non-pharmacological approaches. I explained in particular the risks and ramifications of untreated moderate to severe OSA, especially with respect to developing cardiovascular disease down the Road, including congestive heart failure, difficult to treat hypertension, cardiac arrhythmias, or stroke. Even type 2 diabetes has, in part, been linked to untreated OSA. Symptoms of untreated OSA include daytime sleepiness, memory problems, mood irritability  and mood disorder such as depression and anxiety, lack of energy, as well as recurrent headaches, especially morning headaches. We talked about smoking cessation and trying to maintain a healthy lifestyle in general, as well as the importance of weight control. We also talked about the importance of good sleep hygiene. I recommended the following at this time: sleep study.   I explained the sleep test procedure to the patient and also outlined possible surgical and non-surgical treatment options of OSA, including the use of a custom-made dental device (which would require a referral to a specialist dentist or oral surgeon), upper airway surgical options, such as traditional UPPP or a novel less invasive surgical option in the form of Inspire hypoglossal nerve stimulation (which would involve a referral to an ENT surgeon). I also explained the CPAP treatment option to the patient, who indicated that she would be willing to try CPAP if the need arises. I explained the importance of being compliant with PAP treatment, not only for insurance purposes but primarily to improve Her symptoms, and for the patient's long term health benefit, including to reduce Her cardiovascular risks. I answered all her questions today and the patient was in agreement. I plan to see her back after the sleep study is completed and encouraged her to call with any interim questions, concerns, problems or updates.   Thank you very much for allowing me to participate in the care of this nice patient. If I can be of any further assistance to you please do not hesitate to call me at [phone].  Sincerely,   Star Age, MD, PhD

## 2020-07-21 NOTE — Patient Instructions (Signed)
It was nice to meet you today!   Your sleepiness, grogginess, inability to wake up in the middle of the night with sleepwalking episodes may tie in with your several potentially sedating medications.  Please talk to your psychiatrist about medication management and ongoing monitoring of these.  As discussed, there is no specific medication that helps with sleepwalking.  Based on your symptoms and your exam I believe you are at risk for obstructive sleep apnea (aka OSA), and I think we should proceed with a sleep study to determine whether you do or do not have OSA and how severe it is. Even, if you have mild OSA, I may want you to consider treatment with CPAP, as treatment of even borderline or mild sleep apnea can result and improvement of symptoms such as sleep disruption, daytime sleepiness, nighttime bathroom breaks, restless leg symptoms, improvement of headache syndromes, even improved mood disorder.   As explained, an attended sleep study meaning you get to stay overnight in the sleep lab, lets [place] monitor sleep-related behaviors such as sleep talking and leg movements in sleep, in addition to monitoring for sleep apnea.  A home sleep test is a screening tool for sleep apnea only, and unfortunately does not help with any other sleep-related diagnoses.  Please remember, the long-term risks and ramifications of untreated moderate to severe obstructive sleep apnea are: increased Cardiovascular disease, including congestive heart failure, stroke, difficult to control hypertension, treatment resistant obesity, arrhythmias, especially irregular heartbeat commonly known as A. Fib. (atrial fibrillation); even type 2 diabetes has been linked to untreated OSA.   Sleep apnea can cause disruption of sleep and sleep deprivation in most cases, which, in turn, can cause recurrent headaches, problems with memory, mood, concentration, focus, and vigilance. Most people with untreated sleep apnea report excessive  daytime sleepiness, which can affect their ability to drive. Please do not drive if you feel sleepy. Patients with sleep apnea can also develop difficulty initiating and maintaining sleep (aka insomnia).   Having sleep apnea may increase your risk for other sleep disorders, including involuntary behaviors sleep such as sleep terrors, sleep talking, sleepwalking.    Having sleep apnea can also increase your risk for restless leg syndrome and leg movements at night.   Please note that untreated obstructive sleep apnea may carry additional perioperative morbidity. Patients with significant obstructive sleep apnea (typically, in the moderate to severe degree) should receive, if possible, perioperative PAP (positive airway pressure) therapy and the surgeons and particularly the anesthesiologists should be informed of the diagnosis and the severity of the sleep disordered breathing.   I will likely see you back after your sleep study to go over the test results and where to go from there. We will call you after your sleep study to advise about the results (most likely, you will hear from [facility], my nurse) and to set up an appointment at the time, as necessary.    Our sleep lab administrative assistant will call you to schedule your sleep study and give you further instructions, regarding the check in process for the sleep study, arrival time, what to bring, when you can expect to leave after the study, etc., and to answer any other logistical questions you may have. If you don't hear back from her by about 2 weeks from now, please feel free to call her direct line at [phone] or you can call our general clinic number, or email [place] through My Chart.

## 2020-07-27 ENCOUNTER — Other Ambulatory Visit: Payer: Self-pay

## 2020-07-27 ENCOUNTER — Encounter: Payer: Self-pay | Admitting: Physical Therapy

## 2020-07-27 ENCOUNTER — Ambulatory Visit: Payer: Medicare Other | Attending: Urology | Admitting: Physical Therapy

## 2020-07-27 DIAGNOSIS — M6281 Muscle weakness (generalized): Secondary | ICD-10-CM | POA: Insufficient documentation

## 2020-07-27 DIAGNOSIS — R293 Abnormal posture: Secondary | ICD-10-CM | POA: Diagnosis present

## 2020-07-27 DIAGNOSIS — M62838 Other muscle spasm: Secondary | ICD-10-CM | POA: Insufficient documentation

## 2020-07-27 DIAGNOSIS — R279 Unspecified lack of coordination: Secondary | ICD-10-CM | POA: Diagnosis present

## 2020-07-27 NOTE — Therapy (Signed)
[facility] [address] [place], [place], [address] Phone: [phone]   Fax:  [phone]  Physical Therapy Treatment  Patient Details  Name: Lauren Chapman MRN: [id number] Date of Birth: 1970-08-04 Referring Provider (PT): Lucas Mallow, MD   Encounter Date: 07/27/2020   PT End of Session - 07/27/20 0805     Visit Number 4    Date for PT Re-Evaluation 09/28/20    Authorization Type medicare    PT Start Time 0800    PT Stop Time 0840    PT Time Calculation (min) 40 min             Past Medical History:  Diagnosis Date   Allergy    Anxiety    Bipolar affective psychosis ([place])    Complication of anesthesia    " Acting silly- waving at everyone"-Giddy   Depression    Emphysema lung ([place])    inhaler prn,  followed by pcp and pulmonologist-- dr Margorie John   Family history of adverse reaction to anesthesia    Father had rheumatic fever, and died on operating table.   GERD (gastroesophageal reflux disease)    Kidney cysts 01/24/2020   Right Kidney   Migraine    Smokers' cough ([place])    per pt not productive   SUI (stress urinary incontinence, female)    Wears glasses     Past Surgical History:  Procedure Laterality Date   ABDOMINAL HYSTERECTOMY N/A    Phreesia 11/30/2019   CARPAL TUNNEL RELEASE Right 09-12-2007   @[facility]    and GANGLION CYST EXCISION   CYSTOSCOPY N/A 04/29/2019   Procedure: CYSTOSCOPY;  Surgeon: Joseph Pierini, MD;  Location: [place];  Service: Gynecology;  Laterality: N/A;   DILATATION & CURETTAGE/HYSTEROSCOPY WITH MYOSURE N/A 11/13/2018   Procedure: DILATATION & CURETTAGE/HYSTEROSCOPY WITH MYOSURE;  Surgeon: Anastasio Auerbach, MD;  Location: [place];  Service: Gynecology;  Laterality: N/A;  request 9:00am OR start time in [place] block requests one hour   [place]  02/2019   EYE SURGERY N/A    Phreesia  11/30/2019   GANGLION CYST EXCISION     R wrist   KNEE ARTHROSCOPY WITH ANTERIOR CRUCIATE LIGAMENT (ACL) REPAIR Right 10/2015   LAPAROSCOPIC CHOLECYSTECTOMY  1997   LAPAROSCOPIC HYSTERECTOMY Bilateral 04/29/2019   Procedure: HYSTERECTOMY TOTAL LAPAROSCOPIC, BILATERAL SALPINO-OOPHORECTOMY;  Surgeon: Joseph Pierini, MD;  Location: [place];  Service: Gynecology;  Laterality: Bilateral;   TOOTH EXTRACTION  04/01/2019    There were no vitals filed for this visit.   Subjective Assessment - 07/27/20 0802     Subjective Pt states the coughing has been bad and she is working on quitting.  The exercises are making me poop more    Patient Stated Goals stop having leakage    Currently in Pain? No/denies                               [facility] Adult PT Treatment/Exercise - 07/27/20 0001       Neuro Re-ed    Neuro Re-ed Details  cues to relax pelvic floor all the way and to exhale and not hold breath      Lumbar Exercises: Stretches   Hip Flexor Stretch Right;Left;3 reps;20 seconds    Other Lumbar Stretch Exercise goal pose and thoracic ext stretches 3 x 10 sec hold  Lumbar Exercises: Supine   Bent Knee Raise 20 reps    Large Ball Abdominal Isometric Limitations pretend to hold ball and reach overhead - 15x    Other Supine Lumbar Exercises horizontal abd and diagonals with yellow band      Lumbar Exercises: Quadruped   Single Arm Raise Right;Left;10 reps                         PT Long Term Goals - 07/27/20 0845       PT LONG TERM GOAL #1   Title Patient will be I with final HEP to maintain progress from PT    Status On-going      PT LONG TERM GOAL #2   Title Pt will be able to fully empty bladder    Baseline has been able to do this with double void    Status Achieved      PT LONG TERM GOAL #3   Title Pt will report 75% less leakage    Status On-going      PT LONG TERM GOAL #4   Title Pt will be able to contract and  hold pelvic floor for at least 10 seconds for endurance during physical activities    Baseline breath holding and can only hold 2 seconds    Status On-going      PT LONG TERM GOAL #5   Title Pt will demonstrate 3/5 MMT of pelvic floor in order to prevent leakage when coughing    Status On-going                   Plan - 07/27/20 0817     Clinical Impression Statement Pt has been coughing more due to smoking but she has a plan to begin cessation program.  Pt was given exercises that focused on posture and coordination of breathing with exercise.  Pt will benefit from skilled PT to continue to progress strength with retraining coordination during the muscles to contract and relax correctly for support during functional tasks.    PT Treatment/Interventions ADLs/Self Care Home Management;Biofeedback;Cryotherapy;Electrical Stimulation;Moist Heat;Therapeutic activities;Therapeutic exercise;Neuromuscular re-education;Manual techniques;Patient/family education;Taping;Passive range of motion;Dry needling    PT Next Visit Plan progress core and pelvic floor strength and checking for fully relaxing, stretches prn    PT Home Exercise Plan Access Code: [email or web address]    Consulted and Agree with Plan of Care Patient             Patient will benefit from skilled therapeutic intervention in order to improve the following deficits and impairments:  Abnormal gait, Decreased strength, Decreased range of motion, Decreased coordination, Decreased endurance, Increased muscle spasms, Postural dysfunction, Pain  Visit Diagnosis: Unspecified lack of coordination  Muscle weakness (generalized)  Abnormal posture  Other muscle spasm     Problem List Patient Active Problem List   Diagnosis Date Noted   Depression 03/24/2020   Bilateral low back pain with left-sided sciatica 03/24/2020   Left lumbar radiculopathy 02/25/2020   Absence of bladder continence 02/25/2020   History of postmenopausal  bleeding 04/29/2019   Polypharmacy 04/03/2019   Emphysema, unspecified ([place]) 05/17/2018   Weakness 01/25/2017   Chronic migraine w/o aura w/o status migrainosus, not intractable 12/22/2014   Encounter for other general counseling or advice on contraception 09/02/2014   Bipolar disorder ([place]) 10/12/2011   Anxiety and depression 10/12/2011   GERD (gastroesophageal reflux disease) 10/12/2011    Camillo Flaming Cordel Drewes, PT 07/27/2020, 8:52 AM  [facility] [address], [place], [place], [address] Phone: [phone]   Fax:  [phone]  Name: KYMBERLEY RAZ MRN: [id number] Date of Birth: November 04, 1970

## 2020-08-02 ENCOUNTER — Ambulatory Visit (INDEPENDENT_AMBULATORY_CARE_PROVIDER_SITE_OTHER): Payer: Medicare Other | Admitting: Family Medicine

## 2020-08-02 ENCOUNTER — Other Ambulatory Visit: Payer: Self-pay

## 2020-08-02 ENCOUNTER — Telehealth: Payer: Self-pay

## 2020-08-02 ENCOUNTER — Encounter: Payer: Self-pay | Admitting: Family Medicine

## 2020-08-02 VITALS — BP 126/78 | HR 77 | Temp 98.3°F | Resp 15 | Ht 63.0 in | Wt 188.2 lb

## 2020-08-02 DIAGNOSIS — Z23 Encounter for immunization: Secondary | ICD-10-CM | POA: Diagnosis not present

## 2020-08-02 DIAGNOSIS — F17210 Nicotine dependence, cigarettes, uncomplicated: Secondary | ICD-10-CM | POA: Diagnosis not present

## 2020-08-02 DIAGNOSIS — G43709 Chronic migraine without aura, not intractable, without status migrainosus: Secondary | ICD-10-CM | POA: Diagnosis not present

## 2020-08-02 NOTE — Progress Notes (Signed)
Subjective:  Patient ID: Lauren Chapman, female    DOB: [birth year]  Age: 50 y.o. MRN: [id number]  CC:  Chief Complaint  Patient presents with   Immunizations    Pt reports ready for second dose of shingles today, discuss pneumonia    Nicotine Dependence    Pt reports was working on stopping but therapist advised not at this time due to life stress being high right now and to retry in a few months    Migraine    Pt reports needs refill maxalt and sumatriptan both have worked well for her with no concerns at this time, she has not seen neuro to discuss triptan use     HPI Lauren Chapman presents for   Migraine, chronic without aura, without status Neurologist Dr. Krista Blue.  Previously treated with Inderal, Botox injections.  Dr. Posey Pronto.  Improving control with Emgality with Maxalt as needed.  Uses either rizatriptan or sumatriptan if flare and requesting refills.  Discussed in May, and advised her to discuss either Maxalt or Imitrex with her neurologist.  Was still using Imitrex few days per week. IV Depacon and Toradol given June 28 for persistent migraine. Infusion helped.  Met with sleep specialist June 29, plan for sleep study - has not yet scheduled.  Taking emgality.  Taking sumatriptan once per week then rizatriptan one day per week - depending on if migraine (sumatriptan) vs. Stress migraine (riztriptan with tizanidine) - has refills left.  Weather can trigger headaches.   Nicotine dependence Discussed at her May physical.  Has quit then restarted 3 times, and back at 1/2 pack/day at that time.  Over-the-counter nicotine patch as an option.  As above she has discussed with her therapist who recommended against quitting at this time due to current stressors, with to repeat attempt in the next few months.  She is also followed by psychiatry  Dr. Reece Levy for bipolar disorder.  Counselor Jhonnie Garner.   Health maintenance: Second dose shingles vaccine today Due for Pneumovax with history of  tobacco use  Colonoscopy 07/08/20 - repeat 3 yrs - diverticulosis, few adenomas.    History Patient Active Problem List   Diagnosis Date Noted   Depression 03/24/2020   Bilateral low back pain with left-sided sciatica 03/24/2020   Left lumbar radiculopathy 02/25/2020   Absence of bladder continence 02/25/2020   History of postmenopausal bleeding 04/29/2019   Polypharmacy 04/03/2019   Emphysema, unspecified ([place]) 05/17/2018   Weakness 01/25/2017   Chronic migraine w/o aura w/o status migrainosus, not intractable 12/22/2014   Encounter for other general counseling or advice on contraception 09/02/2014   Bipolar disorder ([place]) 10/12/2011   Anxiety and depression 10/12/2011   GERD (gastroesophageal reflux disease) 10/12/2011   Past Medical History:  Diagnosis Date   Allergy    Anxiety    Bipolar affective psychosis ([place])    Complication of anesthesia    " Acting silly- waving at everyone"-Giddy   Depression    Emphysema lung ([place])    inhaler prn,  followed by pcp and pulmonologist-- dr Margorie John   Family history of adverse reaction to anesthesia    Father had rheumatic fever, and died on operating table.   GERD (gastroesophageal reflux disease)    Kidney cysts 01/24/2020   Right Kidney   Migraine    Smokers' cough ([place])    per pt not productive   SUI (stress urinary incontinence, female)    Tubular adenoma of colon 07/08/2020   Wears glasses  Past Surgical History:  Procedure Laterality Date   ABDOMINAL HYSTERECTOMY N/A    Phreesia 11/30/2019   CARPAL TUNNEL RELEASE Right 09-12-2007   _0    and GANGLION CYST EXCISION   CYSTOSCOPY N/A 04/29/2019   Procedure: CYSTOSCOPY;  Surgeon: Joseph Pierini, MD;  Location: [facility];  Service: Gynecology;  Laterality: N/A;   DILATATION & CURETTAGE/HYSTEROSCOPY WITH MYOSURE N/A 11/13/2018   Procedure: DILATATION & CURETTAGE/HYSTEROSCOPY WITH MYOSURE;  Surgeon: Anastasio Auerbach, MD;  Location: [facility];  Service: Gynecology;  Laterality: N/A;  request 9:00am OR start time in [place] block requests one hour   [place]  02/2019   EYE SURGERY N/A    Phreesia 11/30/2019   GANGLION CYST EXCISION     R wrist   KNEE ARTHROSCOPY WITH ANTERIOR CRUCIATE LIGAMENT (ACL) REPAIR Right 10/2015   LAPAROSCOPIC CHOLECYSTECTOMY  1997   LAPAROSCOPIC HYSTERECTOMY Bilateral 04/29/2019   Procedure: HYSTERECTOMY TOTAL LAPAROSCOPIC, BILATERAL SALPINO-OOPHORECTOMY;  Surgeon: Joseph Pierini, MD;  Location: [place];  Service: Gynecology;  Laterality: Bilateral;   TOOTH EXTRACTION  04/01/2019   Allergies  Allergen Reactions   Codeine Anaphylaxis    Anything with codeine   Prior to Admission medications   Medication Sig Start Date End Date Taking? Authorizing Provider  albuterol (VENTOLIN HFA) 108 (90 Base) MCG/ACT inhaler Inhale 2 puffs into the lungs every 4 (four) hours as needed for wheezing or shortness of breath. 02/20/20  Yes Olalere, Adewale A, MD  ARIPiprazole (ABILIFY) 20 MG tablet Take 20 mg by mouth at bedtime. 06/22/20  Yes [provider]  aspirin-acetaminophen-caffeine (EXCEDRIN MIGRAINE) [phone] MG tablet Take 2 tablets by mouth every 6 (six) hours as needed for headache.   Yes [provider]  citalopram (CELEXA) 20 MG tablet Take 20 mg by mouth daily.   Yes [provider]  clonazePAM (KLONOPIN) 0.5 MG tablet Take 0.5 mg by mouth 2 (two) times daily. 12/04/17  Yes [provider]  esomeprazole (NEXIUM) 20 MG capsule Take 1 capsule (20 mg total) by mouth 2 (two) times daily before a meal. Patient taking differently: Take 20 mg by mouth 2 (two) times daily as needed (indigestion). 03/21/19  Yes Jacelyn Pi, Lilia Argue, MD  gabapentin (NEURONTIN) 800 MG tablet Take 800 mg by mouth 3 (three) times daily.  07/02/18  Yes [provider]  Galcanezumab-gnlm (EMGALITY) 120 MG/ML SOAJ Inject 120 mg into the skin  every 30 (thirty) days. 03/24/20  Yes Marcial Pacas, MD  hydrOXYzine (VISTARIL) 25 MG capsule Take 25 mg by mouth as needed. 04/20/20  Yes [provider]  lamoTRIgine (LAMICTAL) 150 MG tablet Take 150 mg by mouth 2 (two) times daily.    Yes [provider]  mirtazapine (REMERON) 15 MG tablet Take 15 mg by mouth at bedtime. 06/22/20  Yes [provider]  ondansetron (ZOFRAN) 4 MG tablet Take 1 tablet (4 mg total) by mouth every 8 (eight) hours as needed for nausea or vomiting. 01/05/20  Yes Suzzanne Cloud, NP  rizatriptan (MAXALT) 10 MG tablet Take 1 tab at onset of migraine.  May repeat in 2 hrs, if needed.  Max dose: 2 tabs/day. This is a 30 day prescription. Patient taking differently: Take 1 tab at onset of migraine.  May repeat in 2 hrs, if needed.  Max dose: 2 tabs/day. This is a 30 day prescription. 05/31/20  Yes Wendie Agreste, MD  SUMAtriptan (IMITREX) 25 MG tablet Take 1  tablet (25 mg total) by mouth every 2 (two) hours as needed for migraine. May repeat in 2 hours if headache persists or recurs. 05/31/20  Yes Wendie Agreste, MD  tiZANidine (ZANAFLEX) 4 MG tablet Take 1 tablet (4 mg total) by mouth every 6 (six) hours as needed for muscle spasms. TAKE 1 TABLET BY MOUTH EVERY 6 HOURS AS NEEDED FOR MUSCLE SPASMS. 01/05/20  Yes Suzzanne Cloud, NP  tolterodine (DETROL LA) 4 MG 24 hr capsule Take 4 mg by mouth daily. 04/27/20  Yes [provider]   Social History   Socioeconomic History   Marital status: Divorced    Spouse name: n/a   Number of children: 2   Years of education: 18   Highest education level: Not on file  Occupational History   Occupation: Disabled    Comment: Formerly a Pharmacist, hospital.  Tobacco Use   Smoking status: Every Day    Packs/day: 0.50    Years: 33.00    Pack years: 16.50    Types: Cigarettes    Start date: 10/10/1981    Last attempt to quit: 11/10/2019    Years since quitting: 0.7   Smokeless tobacco: Never  Vaping Use   Vaping Use:  Never used  Substance and Sexual Activity   Alcohol use: Never    Alcohol/week: 0.0 standard drinks   Drug use: Never   Sexual activity: Not Currently    Partners: Male    Birth control/protection: Post-menopausal    Comment: 1st intercourse 50 yo-More than 5 partners  Other Topics Concern   Not on file  Social History Narrative   Lives at home with 1 of her two daughters   Right-handed.   2-4 cups caffeine daily.   Disabled    Social Determinants of Health   Financial Resource Strain: Not on file  Food Insecurity: Not on file  Transportation Needs: Not on file  Physical Activity: Not on file  Stress: Not on file  Social Connections: Not on file  Intimate Partner Violence: Not on file    Review of Systems Per HPI.   Objective:   Vitals:   08/02/20 1007  BP: 126/78  Pulse: 77  Resp: 15  Temp: 98.3 F (36.8 C)  TempSrc: Temporal  SpO2: 95%  Weight: 188 lb 3.2 oz (85.4 kg)  Height: 5' 3" (1.6 m)     Physical Exam Vitals reviewed.  Constitutional:      Appearance: Normal appearance. She is well-developed.  HENT:     Head: Normocephalic and atraumatic.  Eyes:     Extraocular Movements: Extraocular movements intact.     Conjunctiva/sclera: Conjunctivae normal.     Pupils: Pupils are equal, round, and reactive to light.  Neck:     Vascular: No carotid bruit.  Cardiovascular:     Rate and Rhythm: Normal rate and regular rhythm.     Heart sounds: Normal heart sounds.  Pulmonary:     Effort: Pulmonary effort is normal.     Breath sounds: Normal breath sounds.  Abdominal:     Palpations: Abdomen is soft. There is no pulsatile mass.     Tenderness: There is no abdominal tenderness.  Musculoskeletal:     Right lower leg: No edema.     Left lower leg: No edema.  Skin:    General: Skin is warm and dry.  Neurological:     General: No focal deficit present.     Mental Status: She is alert and oriented to person, place,  and time.     Sensory: No sensory  deficit.     Motor: No weakness.  Psychiatric:        Mood and Affect: Mood normal.        Behavior: Behavior normal.     28 minutes spent during visit, including chart review, neuro eval/notes, counseling and assimilation of information, exam, discussion of plan, and chart completion.    Assessment & Plan:  ZIERRA LAROQUE is a 50 y.o. female . Chronic migraine w/o aura w/o status migrainosus, not intractable  -Improved from recent flare requiring infusion.  Continue Emgality.  Has refills of both triptans available, but discussed similar medications and recommended she discuss one of the other with her neurologist unless recommended she remain on both.  Sleep study pending.  Need for shingles vaccine - Plan: Varicella-zoster vaccine IM  Need for pneumococcal vaccination - Plan: Pneumococcal conjugate vaccine 20-valent (Prevnar 20)  Cigarette nicotine dependence without complication  -Not ready to quit at this time.  Advised to let me know if I can be of assistance when she is ready to quit.  No orders of the defined types were placed in this encounter.  Patient Instructions  I would recommend discussing your triptan regimen with Dr. Krista Blue to decide if both are needed as they are in the same category of medications.  There are refills available for both, but let us know if the pharmacy does not have those refills.   when you are ready to quit smoking, let me know if there is any help I can provide.  Pneumonia vaccine and second shingles vaccine given today.  Follow-up in 4 months to recheck blood sugar, cholesterol.  Watch diet, walking or other low intensity exercise for now.  Let me know if there are questions.    Signed,   Merri Ray, MD [facility] 08/02/20 10:48 AM

## 2020-08-02 NOTE — Telephone Encounter (Signed)
Returned pt's call. No answer, LVM for pt to call me back to schedule sleep study  

## 2020-08-02 NOTE — Telephone Encounter (Signed)
LVM for pt to call me back to schedule sleep study  

## 2020-08-02 NOTE — Patient Instructions (Addendum)
I would recommend discussing your triptan regimen with Dr. Krista Blue to decide if both are needed as they are in the same category of medications.  There are refills available for both, but let us know if the pharmacy does not have those refills.   when you are ready to quit smoking, let me know if there is any help I can provide.  Pneumonia vaccine and second shingles vaccine given today.  Follow-up in 4 months to recheck blood sugar, cholesterol.  Watch diet, walking or other low intensity exercise for now.  Let me know if there are questions.

## 2020-08-03 ENCOUNTER — Ambulatory Visit: Payer: Medicare Other | Admitting: Physical Therapy

## 2020-08-04 ENCOUNTER — Telehealth: Payer: Self-pay

## 2020-08-04 NOTE — Telephone Encounter (Signed)
LVM for pt to call me back to schedule sleep study  

## 2020-08-10 ENCOUNTER — Encounter: Payer: Self-pay | Admitting: Gastroenterology

## 2020-08-10 ENCOUNTER — Ambulatory Visit (INDEPENDENT_AMBULATORY_CARE_PROVIDER_SITE_OTHER): Payer: Medicare Other | Admitting: Gastroenterology

## 2020-08-10 ENCOUNTER — Ambulatory Visit: Payer: Medicare Other | Admitting: Physical Therapy

## 2020-08-10 ENCOUNTER — Other Ambulatory Visit: Payer: Self-pay

## 2020-08-10 ENCOUNTER — Encounter: Payer: Self-pay | Admitting: Physical Therapy

## 2020-08-10 VITALS — BP 120/80 | HR 76 | Ht 63.0 in | Wt 188.1 lb

## 2020-08-10 DIAGNOSIS — R293 Abnormal posture: Secondary | ICD-10-CM

## 2020-08-10 DIAGNOSIS — R279 Unspecified lack of coordination: Secondary | ICD-10-CM

## 2020-08-10 DIAGNOSIS — R159 Full incontinence of feces: Secondary | ICD-10-CM

## 2020-08-10 DIAGNOSIS — M6281 Muscle weakness (generalized): Secondary | ICD-10-CM

## 2020-08-10 DIAGNOSIS — K625 Hemorrhage of anus and rectum: Secondary | ICD-10-CM | POA: Diagnosis not present

## 2020-08-10 DIAGNOSIS — Z86010 Personal history of colonic polyps: Secondary | ICD-10-CM | POA: Diagnosis not present

## 2020-08-10 DIAGNOSIS — K5730 Diverticulosis of large intestine without perforation or abscess without bleeding: Secondary | ICD-10-CM | POA: Diagnosis not present

## 2020-08-10 DIAGNOSIS — M62838 Other muscle spasm: Secondary | ICD-10-CM

## 2020-08-10 NOTE — Progress Notes (Signed)
    History of Present Illness: This is a 50 year old female returning for intermittent rectal bleeding, intermittent anal discomfort and frequent fecal incontinence.  She states she occasionally has anal swelling and discomfort along with small amounts of bright red blood on the tissue paper when wiping.  No significant anal or rectal findings were noted at her recent colonoscopy.  She has begun pelvic floor PT and has noted an improvement in her fecal incontinence.  She has several questions about her colon polyps and diverticulosis.  Colonoscopy 06/2020 - Three 6 to 7 mm polyps in the rectum and in the ascending colon, removed with a cold snare. Resected and retrieved. - Mild diverticulosis in the right colon. - Mild diverticulosis in the left colon. - The examination was otherwise normal on direct and retroflexion views.  Current Medications, Allergies, Past Medical History, Past Surgical History, Family History and Social History were reviewed in [employer] record.   Physical Exam: General: Well developed, well nourished, no acute distress Head: Normocephalic and atraumatic Eyes: Sclerae anicteric, EOMI Ears: Normal auditory acuity Mouth: Not examined, mask on during Covid-19 pandemic Lungs: Clear throughout to auscultation Heart: Regular rate and rhythm; no murmurs, rubs or bruits Abdomen: Soft, non tender and non distended. No masses, hepatosplenomegaly or hernias noted. Normal Bowel sounds Rectal Not done Musculoskeletal: Symmetrical with no gross deformities  Pulses:  Normal pulses noted Extremities: No clubbing, cyanosis, edema or deformities noted Neurological: Alert oriented x 4, grossly nonfocal Psychological:  Alert and cooperative. Normal mood and affect   Assessment and Recommendations:  Intermittent rectal bleeding and swelling. Suspected hemorrhoid symptoms although not noted on recent colonoscopy. Prep H supp PR qd and Prep H wipes topically  qd for 7 days and then prn.  Fecal incontinence. Improving after 1 month of pelvic floor PT. Continue PT and if fecal incontinence persists consider anorectal manometry.  Personal history of SSP and TA. A 3 year interval colonoscopy in 06/2023 is recommended.  Addressed her questions to her satisfaction. Mild diverticulosis. High fiber diet with adequate daily water intake.  Addressed her questions to her satisfaction.

## 2020-08-10 NOTE — Patient Instructions (Signed)
You can use over the counter preparation H suppositories daily x 7 days then as needed.   The [facility] providers would like to encourage you to use [facility] to communicate with providers for non-urgent requests or questions.  Due to long hold times on the telephone, sending your provider a message by [facility] may be a faster and more efficient way to get a response.  Please allow 48 business hours for a response.  Please remember that this is for non-urgent requests.   Normal BMI (Body Mass Index- based on height and weight) is between 19 and 25. Your BMI today is Body mass index is 33.32 kg/m. Marland Kitchen Please consider follow up  regarding your BMI with your Primary Care Provider.   Thank you for choosing me and [facility].  Pricilla Riffle. Dagoberto Ligas., MD., Marval Regal

## 2020-08-10 NOTE — Patient Instructions (Signed)
Access Code: [email or web address] URL: [email or web address] Date: 08/10/2020 Prepared by: Jari Favre  Exercises Supine Pelvic Floor Stretch - Hands on Knees - 3 x daily - 7 x weekly - 1 sets - 3 reps - 30 hold Supine Diaphragmatic Breathing - 3 x daily - 7 x weekly - 1 sets - 3 reps Seated Scapular Retraction - 3 x daily - 7 x weekly - 1 sets - 10 reps - 5 sec hold Standing Low Back Flexion at Table - 3 x daily - 7 x weekly - 1 sets - 8 reps Supine Butterfly Groin Stretch - 1 x daily - 7 x weekly - 3 reps - 1 sets - 30 sec hold Supine Hip Internal and External Rotation - 1 x daily - 7 x weekly - 10 reps - 1 sets - 5 sec hold Supine Hip Adductor Squeeze with Small Ball - 3 x daily - 7 x weekly - 1 sets - 10 reps - 3 sec hold Seated Shoulder Diagonal Pulls with Resistance - 1 x daily - 7 x weekly - 3 sets - 10 reps Supine Shoulder Horizontal Abduction with Resistance - 1 x daily - 7 x weekly - 3 sets - 10 reps Quadruped Alternating Arm Lift - 1 x daily - 7 x weekly - 10 reps - 2 sets Thoracic Extension Mobilization with Noodle - 1 x daily - 7 x weekly - 3 sets - 10 reps Hooklying Small March - 1 x daily - 7 x weekly - 10 reps - 2 sets Seated Thoracic Lumbar Extension with Pectoralis Stretch - 1 x daily - 7 x weekly - 1 sets - 10 reps - 5 hold Seated Scapular Retraction - 3 x daily - 7 x weekly - 10 reps - 1 sets - 5 sec hold Standing Shoulder Flexion to 90 Degrees with Dumbbells - 1 x daily - 7 x weekly - 3 sets - 10 reps Shoulder extension with resistance - Neutral - 1 x daily - 7 x weekly - 3 sets - 10 reps

## 2020-08-10 NOTE — Therapy (Signed)
[facility] [address], [place], [place], [address] Phone: [phone]   Fax:  [phone]  Physical Therapy Treatment  Patient Details  Name: Lauren Chapman MRN: [id number] Date of Birth: 11-23-1970 Referring Provider (PT): Lucas Mallow, MD   Encounter Date: 08/10/2020   PT End of Session - 08/10/20 1236     Visit Number 5    Date for PT Re-Evaluation 09/28/20    Authorization Type medicare    PT Start Time 1233    PT Stop Time 1311    PT Time Calculation (min) 38 min    Activity Tolerance Patient tolerated treatment well    Behavior During Therapy [facility] for tasks assessed/performed             Past Medical History:  Diagnosis Date   Allergy    Anxiety    Bipolar affective psychosis ([place])    Complication of anesthesia    " Acting silly- waving at everyone"-Giddy   Depression    Emphysema lung ([place])    inhaler prn,  followed by pcp and pulmonologist-- dr Margorie John   Family history of adverse reaction to anesthesia    Father had rheumatic fever, and died on operating table.   GERD (gastroesophageal reflux disease)    Kidney cysts 01/24/2020   Right Kidney   Migraine    Smokers' cough ([place])    per pt not productive   SUI (stress urinary incontinence, female)    Tubular adenoma of colon 07/08/2020   Wears glasses     Past Surgical History:  Procedure Laterality Date   ABDOMINAL HYSTERECTOMY N/A    Phreesia 11/30/2019   CARPAL TUNNEL RELEASE Right 09-12-2007   @[facility]    and GANGLION CYST EXCISION   CYSTOSCOPY N/A 04/29/2019   Procedure: CYSTOSCOPY;  Surgeon: Joseph Pierini, MD;  Location: [facility];  Service: Gynecology;  Laterality: N/A;   DILATATION & CURETTAGE/HYSTEROSCOPY WITH MYOSURE N/A 11/13/2018   Procedure: DILATATION & CURETTAGE/HYSTEROSCOPY WITH MYOSURE;  Surgeon: Anastasio Auerbach, MD;  Location: [place];  Service: Gynecology;  Laterality: N/A;   request 9:00am OR start time in [place] block requests one hour   [place]  02/2019   EYE SURGERY N/A    Phreesia 11/30/2019   GANGLION CYST EXCISION     R wrist   KNEE ARTHROSCOPY WITH ANTERIOR CRUCIATE LIGAMENT (ACL) REPAIR Right 10/2015   LAPAROSCOPIC CHOLECYSTECTOMY  1997   LAPAROSCOPIC HYSTERECTOMY Bilateral 04/29/2019   Procedure: HYSTERECTOMY TOTAL LAPAROSCOPIC, BILATERAL SALPINO-OOPHORECTOMY;  Surgeon: Joseph Pierini, MD;  Location: [facility];  Service: Gynecology;  Laterality: Bilateral;   TOOTH EXTRACTION  04/01/2019    There were no vitals filed for this visit.   Subjective Assessment - 08/10/20 1249     Subjective Pt states she is feeling 60% better with leakage.  Pt states she has been tripping over her feet and fell this weekend scaping her left knee.  Pt states she fell 3x since December    Patient Stated Goals stop having leakage    Currently in Pain? No/denies                               [facility] Adult PT Treatment/Exercise - 08/10/20 0001       Lumbar Exercises: Stretches   Other Lumbar Stretch Exercise thoracic ext; scap squeeze - 10x  Lumbar Exercises: Aerobic   UBE (Upper Arm Bike) L1 2/2 fwd/back      Lumbar Exercises: Standing   Other Standing Lumbar Exercises shoulder flex 4lb - exhale with exertion    Other Standing Lumbar Exercises SLS with core engaged on foam                         PT Long Term Goals - 08/10/20 1238       PT LONG TERM GOAL #1   Title Patient will be I with final HEP to maintain progress from PT    Status On-going      PT LONG TERM GOAL #3   Title Pt will report 75% less leakage    Baseline 60% better    Status Partially Met      PT LONG TERM GOAL #4   Title Pt will be able to contract and hold pelvic floor for at least 10 seconds for endurance during physical activities      PT LONG TERM GOAL #5   Title Pt will demonstrate 3/5 MMT  of pelvic floor in order to prevent leakage when coughing    Baseline still leaking with coughing and sneezing    Status On-going                   Plan - 08/10/20 1501     Clinical Impression Statement Pt is reporting 60% improvement since starting with PT.  Pt was able to progress exercises and incorporating balance for improved core strength as well as addressing her somewhat regular LOB which she reported today.  Pt was given exercise progressions as seen in HEP.  She needs cues to remember to exhale on exertion and stand without locking out her knees.  Pt will benefit from skilled PT to continue to address core and posture strength.    PT Treatment/Interventions ADLs/Self Care Home Management;Biofeedback;Cryotherapy;Electrical Stimulation;Moist Heat;Therapeutic activities;Therapeutic exercise;Neuromuscular re-education;Manual techniques;Patient/family education;Taping;Passive range of motion;Dry needling    PT Next Visit Plan progress core and pelvic floor strength and checking for fully relaxing, stretches prn, incorporate balance, WTY on pball, assess pelvic floor endurance    PT Home Exercise Plan Access Code: [email or web address]    Consulted and Agree with Plan of Care Patient             Patient will benefit from skilled therapeutic intervention in order to improve the following deficits and impairments:  Abnormal gait, Decreased strength, Decreased range of motion, Decreased coordination, Decreased endurance, Increased muscle spasms, Postural dysfunction, Pain  Visit Diagnosis: Unspecified lack of coordination  Muscle weakness (generalized)  Abnormal posture  Other muscle spasm     Problem List Patient Active Problem List   Diagnosis Date Noted   Depression 03/24/2020   Bilateral low back pain with left-sided sciatica 03/24/2020   Left lumbar radiculopathy 02/25/2020   Absence of bladder continence 02/25/2020   History of postmenopausal bleeding 04/29/2019    Polypharmacy 04/03/2019   Emphysema, unspecified ([place]) 05/17/2018   Weakness 01/25/2017   Chronic migraine w/o aura w/o status migrainosus, not intractable 12/22/2014   Encounter for other general counseling or advice on contraception 09/02/2014   Bipolar disorder ([place]) 10/12/2011   Anxiety and depression 10/12/2011   GERD (gastroesophageal reflux disease) 10/12/2011    Jule Ser, PT 08/10/2020, 3:14 PM  [facility] [address] [place], [place], [address] Phone: [phone]   Fax:  [phone]  Name: Blaklee  MARYROSE COLVIN MRN: [id number] Date of Birth: [birth year]

## 2020-08-17 ENCOUNTER — Ambulatory Visit: Payer: Medicare Other | Admitting: Physical Therapy

## 2020-08-17 ENCOUNTER — Encounter: Payer: Self-pay | Admitting: Physical Therapy

## 2020-08-17 ENCOUNTER — Other Ambulatory Visit: Payer: Self-pay

## 2020-08-17 DIAGNOSIS — M62838 Other muscle spasm: Secondary | ICD-10-CM

## 2020-08-17 DIAGNOSIS — R279 Unspecified lack of coordination: Secondary | ICD-10-CM

## 2020-08-17 DIAGNOSIS — R293 Abnormal posture: Secondary | ICD-10-CM

## 2020-08-17 DIAGNOSIS — M6281 Muscle weakness (generalized): Secondary | ICD-10-CM

## 2020-08-17 NOTE — Therapy (Signed)
[facility] [address] [place], [place], [address] Phone: [phone]   Fax:  [phone]  Physical Therapy Treatment  Patient Details  Name: Lauren Chapman MRN: [id number] Date of Birth: Jul 03, 1970 Referring Provider (PT): Lucas Mallow, MD   Encounter Date: 08/17/2020   PT End of Session - 08/17/20 1154     Visit Number 6    Date for PT Re-Evaluation 09/28/20    Authorization Type medicare    PT Start Time 1153   late   PT Stop Time 1231    PT Time Calculation (min) 38 min    Activity Tolerance Patient tolerated treatment well    Behavior During Therapy [facility] for tasks assessed/performed             Past Medical History:  Diagnosis Date   Allergy    Anxiety    Bipolar affective psychosis ([place])    Complication of anesthesia    " Acting silly- waving at everyone"-Giddy   Depression    Emphysema lung ([place])    inhaler prn,  followed by pcp and pulmonologist-- dr Margorie John   Family history of adverse reaction to anesthesia    Father had rheumatic fever, and died on operating table.   GERD (gastroesophageal reflux disease)    Kidney cysts 01/24/2020   Right Kidney   Migraine    Smokers' cough ([place])    per pt not productive   SUI (stress urinary incontinence, female)    Tubular adenoma of colon 07/08/2020   Wears glasses     Past Surgical History:  Procedure Laterality Date   ABDOMINAL HYSTERECTOMY N/A    Phreesia 11/30/2019   CARPAL TUNNEL RELEASE Right 09-12-2007   _0    and GANGLION CYST EXCISION   CYSTOSCOPY N/A 04/29/2019   Procedure: CYSTOSCOPY;  Surgeon: Joseph Pierini, MD;  Location: [facility];  Service: Gynecology;  Laterality: N/A;   DILATATION & CURETTAGE/HYSTEROSCOPY WITH MYOSURE N/A 11/13/2018   Procedure: DILATATION & CURETTAGE/HYSTEROSCOPY WITH MYOSURE;  Surgeon: Anastasio Auerbach, MD;  Location: [facility];  Service: Gynecology;  Laterality:  N/A;  request 9:00am OR start time in [place] block requests one hour   [place]  02/2019   EYE SURGERY N/A    Phreesia 11/30/2019   GANGLION CYST EXCISION     R wrist   KNEE ARTHROSCOPY WITH ANTERIOR CRUCIATE LIGAMENT (ACL) REPAIR Right 10/2015   LAPAROSCOPIC CHOLECYSTECTOMY  1997   LAPAROSCOPIC HYSTERECTOMY Bilateral 04/29/2019   Procedure: HYSTERECTOMY TOTAL LAPAROSCOPIC, BILATERAL SALPINO-OOPHORECTOMY;  Surgeon: Joseph Pierini, MD;  Location: [facility];  Service: Gynecology;  Laterality: Bilateral;   TOOTH EXTRACTION  04/01/2019    There were no vitals filed for this visit.   Subjective Assessment - 08/17/20 1352     Subjective I am still having less leakage by about 15% less that last week.    Patient Stated Goals stop having leakage    Currently in Pain? No/denies                               [facility] Adult PT Treatment/Exercise - 08/17/20 0001       Lumbar Exercises: Stretches   Hip Flexor Stretch Right;Left;3 reps;20 seconds      Lumbar Exercises: Aerobic   UBE (Upper Arm Bike) L1 2/2 fwd/back      Lumbar Exercises: Standing   Other Standing  Lumbar Exercises seated shoulder flexion yellow ball      Lumbar Exercises: Seated   Hip Flexion on Ball Strengthening;Both;20 reps      Lumbar Exercises: Supine   Other Supine Lumbar Exercises W, Y, T on ball semi-prone                    PT Education - 08/17/20 1354     Education Details update medbridge seen online    Person(s) Educated Patient    Methods Explanation;Demonstration;Tactile cues;Verbal cues;Handout    Comprehension Verbalized understanding;Returned demonstration                 PT Long Term Goals - 08/17/20 1220       PT LONG TERM GOAL #1   Title Patient will be I with final HEP to maintain progress from PT    Status On-going      PT LONG TERM GOAL #2   Title Pt will be able to fully empty bladder    Status  Achieved      PT LONG TERM GOAL #3   Title Pt will report 75% less leakage    Status Partially Met      PT LONG TERM GOAL #4   Title Pt will be able to contract and hold pelvic floor for at least 10 seconds for endurance during physical activities    Status On-going      PT LONG TERM GOAL #5   Title Pt will demonstrate 3/5 MMT of pelvic floor in order to prevent leakage when coughing    Status On-going                   Plan - 08/17/20 1230     Clinical Impression Statement Pt reports she is better than last week. Pt was able to continue to progress exercises and added more challenging to HEP.  Pt was able to breathe correctly throughout.  Pt was challenged with sitting on ball working on core stability.  Pt will benefit from skilled PT to continue to progress core and pelvic strength for reduced leakage.    PT Treatment/Interventions ADLs/Self Care Home Management;Biofeedback;Cryotherapy;Electrical Stimulation;Moist Heat;Therapeutic activities;Therapeutic exercise;Neuromuscular re-education;Manual techniques;Patient/family education;Taping;Passive range of motion;Dry needling    PT Next Visit Plan final HEP and re-assess goals.    PT Home Exercise Plan Access Code: [email or web address]    Consulted and Agree with Plan of Care Patient             Patient will benefit from skilled therapeutic intervention in order to improve the following deficits and impairments:  Abnormal gait, Decreased strength, Decreased range of motion, Decreased coordination, Decreased endurance, Increased muscle spasms, Postural dysfunction, Pain  Visit Diagnosis: Unspecified lack of coordination  Muscle weakness (generalized)  Abnormal posture  Other muscle spasm     Problem List Patient Active Problem List   Diagnosis Date Noted   Depression 03/24/2020   Bilateral low back pain with left-sided sciatica 03/24/2020   Left lumbar radiculopathy 02/25/2020   Absence of bladder continence 02/25/2020    History of postmenopausal bleeding 04/29/2019   Polypharmacy 04/03/2019   Emphysema, unspecified ([place]) 05/17/2018   Weakness 01/25/2017   Chronic migraine w/o aura w/o status migrainosus, not intractable 12/22/2014   Encounter for other general counseling or advice on contraception 09/02/2014   Bipolar disorder ([place]) 10/12/2011   Anxiety and depression 10/12/2011   GERD (gastroesophageal reflux disease) 10/12/2011    Camillo Flaming Ruel Dimmick, PT 08/17/2020, 2:03 PM  [facility] [address], [place], [place], [address] Phone: [phone]   Fax:  [phone]  Name: Lauren Chapman MRN: [id number] Date of Birth: [birth year]

## 2020-08-24 ENCOUNTER — Ambulatory Visit: Payer: Medicare Other | Attending: Urology | Admitting: Physical Therapy

## 2020-08-24 ENCOUNTER — Other Ambulatory Visit: Payer: Self-pay

## 2020-08-24 ENCOUNTER — Encounter: Payer: Self-pay | Admitting: Physical Therapy

## 2020-08-24 DIAGNOSIS — R279 Unspecified lack of coordination: Secondary | ICD-10-CM | POA: Diagnosis present

## 2020-08-24 DIAGNOSIS — M62838 Other muscle spasm: Secondary | ICD-10-CM | POA: Insufficient documentation

## 2020-08-24 DIAGNOSIS — R293 Abnormal posture: Secondary | ICD-10-CM | POA: Diagnosis present

## 2020-08-24 DIAGNOSIS — M6281 Muscle weakness (generalized): Secondary | ICD-10-CM | POA: Insufficient documentation

## 2020-08-24 NOTE — Therapy (Signed)
[facility] [address], [place], [place], [address] Phone: [phone]   Fax:  [phone]  Physical Therapy Treatment  Patient Details  Name: Lauren Chapman MRN: [id number] Date of Birth: 1970-09-27 Referring Provider (PT): Lucas Mallow, MD   Encounter Date: 08/24/2020   PT End of Session - 08/24/20 1040     Visit Number 7    Date for PT Re-Evaluation 09/28/20    Authorization Type medicare    PT Start Time 1015    PT Stop Time 1053    PT Time Calculation (min) 38 min    Activity Tolerance Patient tolerated treatment well    Behavior During Therapy [facility] for tasks assessed/performed             Past Medical History:  Diagnosis Date   Allergy    Anxiety    Bipolar affective psychosis ([place])    Complication of anesthesia    " Acting silly- waving at everyone"-Giddy   Depression    Emphysema lung ([place])    inhaler prn,  followed by pcp and pulmonologist-- dr Margorie John   Family history of adverse reaction to anesthesia    Father had rheumatic fever, and died on operating table.   GERD (gastroesophageal reflux disease)    Kidney cysts 01/24/2020   Right Kidney   Migraine    Smokers' cough ([place])    per pt not productive   SUI (stress urinary incontinence, female)    Tubular adenoma of colon 07/08/2020   Wears glasses     Past Surgical History:  Procedure Laterality Date   ABDOMINAL HYSTERECTOMY N/A    Phreesia 11/30/2019   CARPAL TUNNEL RELEASE Right 09-12-2007   @[facility]    and GANGLION CYST EXCISION   CYSTOSCOPY N/A 04/29/2019   Procedure: CYSTOSCOPY;  Surgeon: Joseph Pierini, MD;  [facility]: [facility];  Service: Gynecology;  Laterality: N/A;   DILATATION & CURETTAGE/HYSTEROSCOPY WITH MYOSURE N/A 11/13/2018   Procedure: DILATATION & CURETTAGE/HYSTEROSCOPY WITH MYOSURE;  Surgeon: Anastasio Auerbach, MD;  Location: [place];  Service: Gynecology;  Laterality: N/A;   request 9:00am OR start time in [place] block requests one hour   [place]  02/2019   EYE SURGERY N/A    Phreesia 11/30/2019   GANGLION CYST EXCISION     R wrist   KNEE ARTHROSCOPY WITH ANTERIOR CRUCIATE LIGAMENT (ACL) REPAIR Right 10/2015   LAPAROSCOPIC CHOLECYSTECTOMY  1997   LAPAROSCOPIC HYSTERECTOMY Bilateral 04/29/2019   Procedure: HYSTERECTOMY TOTAL LAPAROSCOPIC, BILATERAL SALPINO-OOPHORECTOMY;  Surgeon: Joseph Pierini, MD;  Location: [place];  Service: Gynecology;  Laterality: Bilateral;   TOOTH EXTRACTION  04/01/2019    There were no vitals filed for this visit.   Subjective Assessment - 08/24/20 1022     Subjective I hurt my back when looking down.  it hurts right between my shoulder blades.    Patient Stated Goals stop having leakage    Currently in Pain? Yes    Pain Score 2     Pain Location Back    Pain Orientation Mid;Upper    Pain Descriptors / Indicators Stabbing    Pain Type Acute pain    Pain Onset 1 to 4 weeks ago    Pain Frequency Intermittent    Multiple Pain Sites No                [facility] PT Assessment - 08/24/20 0001  Assessment   Medical Diagnosis R35.0 (ICD-10-CM) - Frequency of micturition;R39.15 (ICD-10-CM) - Urgency of urination;N39.46 (ICD-10-CM) - Mixed incontinence    Referring Provider (PT) Lucas Mallow, MD                        Pelvic Floor Special Questions - 08/24/20 0001     Pelvic Floor Internal Exam pt identity confimred and informed consent given    Exam Type Vaginal    Strength fair squeeze, definite lift    Strength # of seconds 8    Tone normal               [facility] Adult PT Treatment/Exercise - 08/24/20 0001       Lumbar Exercises: Stretches   Other Lumbar Stretch Exercise thoracic ext ball behind back and rotation around ball; UE overhead      Manual Therapy   Manual Therapy Myofascial release    Soft tissue mobilization suboccipitials     Myofascial Release cervical release down through thoracic with traction                         PT Long Term Goals - 08/24/20 1026       PT LONG TERM GOAL #1   Title Patient will be I with final HEP to maintain progress from PT    Status Achieved      PT LONG TERM GOAL #2   Title Pt will be able to fully empty bladder    Status Achieved      PT LONG TERM GOAL #3   Title Pt will report 75% less leakage    Baseline 75% improved    Status Achieved      PT LONG TERM GOAL #4   Title Pt will be able to contract and hold pelvic floor for at least 10 seconds for endurance during physical activities    Baseline holding for 8 seconds with 3/5 strength then slightly diminished for 3 more seconds    Status Achieved      PT LONG TERM GOAL #5   Title Pt will demonstrate 3/5 MMT of pelvic floor in order to prevent leakage when coughing    Baseline still leaking with coughing and sneezing and when I wake up my depends are full, but strength improved 3/5 from 1/5    Status Partially Met                   Plan - 08/24/20 1447     Clinical Impression Statement Pt is doing well with HEP.  She has met most goals and is ind with her HEP at this time.  Pt has improved MMT to 3/5 and can hold 8 seconds which is vast improvement from starting PT.  Pt will d/c today with HEP    PT Treatment/Interventions ADLs/Self Care Home Management;Biofeedback;Cryotherapy;Electrical Stimulation;Moist Heat;Therapeutic activities;Therapeutic exercise;Neuromuscular re-education;Manual techniques;Patient/family education;Taping;Passive range of motion;Dry needling    PT Next Visit Plan d/c today    PT Home Exercise Plan Access Code: [email or web address]    Consulted and Agree with Plan of Care Patient             Patient will benefit from skilled therapeutic intervention in order to improve the following deficits and impairments:  Abnormal gait, Decreased strength, Decreased range of motion,  Decreased coordination, Decreased endurance, Increased muscle spasms, Postural dysfunction, Pain  Visit Diagnosis: Unspecified lack of coordination  Muscle weakness (  generalized)  Abnormal posture  Other muscle spasm     Problem List Patient Active Problem List   Diagnosis Date Noted   Depression 03/24/2020   Bilateral low back pain with left-sided sciatica 03/24/2020   Left lumbar radiculopathy 02/25/2020   Absence of bladder continence 02/25/2020   History of postmenopausal bleeding 04/29/2019   Polypharmacy 04/03/2019   Emphysema, unspecified ([place]) 05/17/2018   Weakness 01/25/2017   Chronic migraine w/o aura w/o status migrainosus, not intractable 12/22/2014   Encounter for other general counseling or advice on contraception 09/02/2014   Bipolar disorder ([place]) 10/12/2011   Anxiety and depression 10/12/2011   GERD (gastroesophageal reflux disease) 10/12/2011    Camillo Flaming Akilah Cureton 08/24/2020, 2:58 PM  [facility] [address] [place], [place], [address] Phone: [phone]   Fax:  [phone]  Name: NISHKA HEIDE MRN: [id number] Date of Birth: 15-Mar-1970   PHYSICAL THERAPY DISCHARGE SUMMARY  Visits from Start of Care: 7  Current functional level related to goals / functional outcomes:   See above goals achieved Remaining deficits: See above   Education / Equipment: HEP  Patient agrees to discharge. Patient goals were met. Patient is being discharged due to meeting the stated rehab goals.  Gustavus Bryant, PT 08/24/20 2:59 PM

## 2020-08-25 ENCOUNTER — Ambulatory Visit (INDEPENDENT_AMBULATORY_CARE_PROVIDER_SITE_OTHER): Payer: Medicare Other | Admitting: Neurology

## 2020-08-25 DIAGNOSIS — E669 Obesity, unspecified: Secondary | ICD-10-CM

## 2020-08-25 DIAGNOSIS — R400 Somnolence: Secondary | ICD-10-CM

## 2020-08-25 DIAGNOSIS — R0681 Apnea, not elsewhere classified: Secondary | ICD-10-CM

## 2020-08-25 DIAGNOSIS — G4733 Obstructive sleep apnea (adult) (pediatric): Secondary | ICD-10-CM | POA: Diagnosis not present

## 2020-08-25 DIAGNOSIS — Z79899 Other long term (current) drug therapy: Secondary | ICD-10-CM

## 2020-08-25 DIAGNOSIS — R0683 Snoring: Secondary | ICD-10-CM

## 2020-08-25 DIAGNOSIS — G4750 Parasomnia, unspecified: Secondary | ICD-10-CM

## 2020-08-26 NOTE — Procedures (Signed)
     [facility]  HOME SLEEP TEST (Watch PAT) REPORT  STUDY DATE: 08/25/2020  DOB: 1970/12/29  MRN: [id number]  ORDERING CLINICIAN: Star Age, MD, PhD   REFERRING CLINICIAN: Wendie Agreste, MD   CLINICAL INFORMATION/HISTORY: 50 year old right-handed woman with an underlying medical history of allergies, anxiety, depression, emphysema, reflux disease, kidney cyst, migraine headaches (for which she is followed by my colleague Dr. Krista Blue), and obesity, who reports snoring and excessive daytime somnolence as well as witnessed apneas.  Epworth sleepiness score: 20/24.  BMI: 33.2 kg/m  FINDINGS:   Sleep Summary:   Total Recording Time (hours, min): 7 hours, 5 minutes  Total Sleep Time (hours, min):  6 hours, 13 minutes   Percent REM (%):    19.7%   Respiratory Indices:   Calculated pAHI (per hour):  10.5/hour         REM pAHI:    20.8/hour       NREM pAHI: 8/hour  Oxygen Saturation Statistics:    Oxygen Saturation (%) Mean: 90%   Minimum oxygen saturation (%):                 81%   O2 Saturation Range (%): 81-96%    O2 Saturation (minutes) <=88%: 30.1 min  Pulse Rate Statistics:   Pulse Mean (bpm):    74/min    Pulse Range (51-100/min)   IMPRESSION: OSA (obstructive sleep apnea)   RECOMMENDATION:  This home sleep test demonstrates overall mild obstructive sleep apnea with a total AHI of 10.5/hour and O2 nadir of 81%, however, there could have been a period of time with lower oxygen saturation which could be erroneous.  Intermittent mild to moderate snoring was noted. Given the patient's medical history and sleep related complaints, treatment with positive airway pressure is recommended. This can be achieved in the form of autoPAP trial/titration at home. A  full night CPAP titration study will help with proper treatment settings and mask fitting if needed. Alternative treatments include weight loss along with avoidance of the supine sleep  position, or an oral appliance in appropriate candidates.   Please note that untreated obstructive sleep apnea may carry additional perioperative morbidity. Patients with significant obstructive sleep apnea should receive perioperative PAP therapy and the surgeons and particularly the anesthesiologist should be informed of the diagnosis and the severity of the sleep disordered breathing. The patient should be cautioned not to drive, work at heights, or operate dangerous or heavy equipment when tired or sleepy. Review and reiteration of good sleep hygiene measures should be pursued with any patient. Other causes of the patient's symptoms, including circadian rhythm disturbances, an underlying mood disorder, medication effect and/or an underlying medical problem cannot be ruled out based on this test. Clinical correlation is recommended.   The patient and her referring provider will be notified of the test results. The patient will be seen in follow up in sleep clinic at [facility].  I certify that I have reviewed the raw data recording prior to the issuance of this report in accordance with the standards of the American Academy of Sleep Medicine (AASM).  INTERPRETING PHYSICIAN:   Star Age, MD, PhD  Board Certified in Neurology and Sleep Medicine  [facility] [address], [place], [place] [address] [phone]

## 2020-08-26 NOTE — Progress Notes (Signed)
See procedure note.

## 2020-09-01 ENCOUNTER — Telehealth: Payer: Self-pay | Admitting: *Deleted

## 2020-09-01 DIAGNOSIS — G4733 Obstructive sleep apnea (adult) (pediatric): Secondary | ICD-10-CM

## 2020-09-01 NOTE — Telephone Encounter (Signed)
I called the patient and LVM (ok per DPR) advising patient of her home sleep test results as noted in detail below by Dr. Rexene Alberts.  Results included recommendations of treatment with AutoPap but also alternative treatment options.  I have asked the patient to give [place] a call back letting us know which she would like to do or she can send [place] a MyChart message.  Sent pt a Therapist, music.

## 2020-09-01 NOTE — Telephone Encounter (Signed)
-----   Message from Star Age, MD sent at 08/26/2020  4:35 PM EDT ----- Patient referred by Dr. Carlota Raspberry for sleep evaluation.  She usually sees Dr. Krista Blue.  I saw her on 07/21/2020 and she had a home sleep test on 08/25/2020.   Please call and notify the patient that the recent home sleep test showed obstructive sleep apnea. OSA is overall mild, but she did have some desaturations into the 80s.  There was a stretch of time during the study where it is not completely sure if these were true desaturations or just an error with the oxygen on pulse sensor to be honest.  Nevertheless, she has evidence of mild sleep apnea.  Given her sleepiness, we can try treatment in the form of an AutoPap machine.  If she would like to consider an alternative such as working on weight loss, a dental treatment with a dental device, we can certainly facilitate a referral to a dentist practice if she would prefer.  Let me know if she would like to proceed with an AutoPap trial, I will send an order to a local DME company and once she is given her machine, she would have to follow-up in sleep clinic in about 31 to 89 days after set up.  Let me know how she would like to proceed.

## 2020-09-02 ENCOUNTER — Institutional Professional Consult (permissible substitution): Payer: Medicare Other | Admitting: Neurology

## 2020-09-02 NOTE — Telephone Encounter (Signed)
Sent secure message to [employer] regarding new PAP orders.

## 2020-09-06 NOTE — Telephone Encounter (Signed)
CM sent to [facility] for new autopap for pt.

## 2020-09-06 NOTE — Telephone Encounter (Signed)
AutoPap order placed.  Please send order to [facility] and advise patient to make an appointment in sleep clinic for a 31 to 89 days post AutoPap start.

## 2020-09-06 NOTE — Addendum Note (Signed)
Addended by: Star Age on: 09/06/2020 05:02 PM   Modules accepted: Orders

## 2020-10-04 ENCOUNTER — Encounter: Payer: Self-pay | Admitting: Gastroenterology

## 2020-10-04 ENCOUNTER — Ambulatory Visit (INDEPENDENT_AMBULATORY_CARE_PROVIDER_SITE_OTHER): Payer: Medicare Other | Admitting: Gastroenterology

## 2020-10-04 VITALS — BP 110/70 | HR 64 | Ht 63.0 in | Wt 185.2 lb

## 2020-10-04 DIAGNOSIS — K219 Gastro-esophageal reflux disease without esophagitis: Secondary | ICD-10-CM

## 2020-10-04 DIAGNOSIS — R1013 Epigastric pain: Secondary | ICD-10-CM | POA: Diagnosis not present

## 2020-10-04 NOTE — Patient Instructions (Signed)
Take your Nexium 20 mg twice daily.   Patient advised to avoid spicy, acidic, citrus, chocolate, mints, fruit and fruit juices.  Limit the intake of caffeine, alcohol and Soda.  Don't exercise too soon after eating.  Don't lie down within 3-4 hours of eating.  Elevate the head of your bed.  You have been scheduled for an endoscopy. Please follow written instructions given to you at your visit today. If you use inhalers (even only as needed), please bring them with you on the day of your procedure.  The [facility] providers would like to encourage you to use [facility] to communicate with providers for non-urgent requests or questions.  Due to long hold times on the telephone, sending your provider a message by [facility] may be a faster and more efficient way to get a response.  Please allow 48 business hours for a response.  Please remember that this is for non-urgent requests.   Due to recent changes in healthcare laws, you may see the results of your imaging and laboratory studies on MyChart before your provider has had a chance to review them.  We understand that in some cases there may be results that are confusing or concerning to you. Not all laboratory results come back in the same time frame and the provider may be waiting for multiple results in order to interpret others.  Please give [place] 48 hours in order for your provider to thoroughly review all the results before contacting the office for clarification of your results.   Normal BMI (Body Mass Index- based on height and weight) is between 19 and 25. Your BMI today is Body mass index is 32.82 kg/m. Marland Kitchen Please consider follow up  regarding your BMI with your Primary Care Provider.  Thank you for choosing me and [facility].  Pricilla Riffle. Dagoberto Ligas., MD., Marval Regal

## 2020-10-04 NOTE — Progress Notes (Signed)
    History of Present Illness: This is a 50 year old female with epigastric pain and reflux symptoms.  She relates worsening problems with sharp epigastric pain that has bothered her constantly for the past 2 months.  She takes Nexium 20 mg once daily and occasionally takes a second dose which has only had a minimal effect on symptoms.  She notes certain spicy foods exacerbate her symptoms.  She relates she had an ulcer diagnosed at [facility] about 15 years ago at endoscopy.  We do not have those records available today.  She takes Excedrin migraine about twice per week for headaches.  Her fecal incontinence continues to improve with pelvic floor PT.   CT AP 03/14/2020 1. Mild bladder wall thickening. Correlation with urinalysis is recommended. 2. No radiopaque kidney stones. No hydronephrosis. 3. Splenomegaly. 4. Probable underlying hepatic steatosis.  Current Medications, Allergies, Past Medical History, Past Surgical History, Family History and Social History were reviewed in [employer] record.   Physical Exam: General: Well developed, well nourished, no acute distress Head: Normocephalic and atraumatic Eyes: Sclerae anicteric, EOMI Ears: Normal auditory acuity Mouth: Not examined, mask on during Covid-19 pandemic Lungs: Clear throughout to auscultation Heart: Regular rate and rhythm; no murmurs, rubs or bruits Abdomen: Soft, non tender and non distended. No masses, hepatosplenomegaly or hernias noted. Normal Bowel sounds Rectal: Not done Musculoskeletal: Symmetrical with no gross deformities  Pulses:  Normal pulses noted Extremities: No clubbing, cyanosis, edema or deformities noted Neurological: Alert oriented x 4, grossly nonfocal Psychological:  Alert and cooperative. Normal mood and affect   Assessment and Recommendations:  Epigastric pain, GERD.  Rule out ulcer, gastritis, esophagitis and other disorders.  Remain on Nexium 20 mg po bid until further  advice is provided following EGD. Closely follow antireflux measures.  Minimize Excedrin Migraine use.  Schedule EGD. The risks (including bleeding, perforation, infection, missed lesions, medication reactions and possible hospitalization or surgery if complications occur), benefits, and alternatives to endoscopy with possible biopsy and possible dilation were discussed with the patient and they consent to proceed.   Fecal incontinence continues to improve with pelvic floor PT. Personal history of SSP and TA.  Surveillance colonoscopy is recommended in June 2025.

## 2020-10-05 ENCOUNTER — Encounter: Payer: Self-pay | Admitting: Gastroenterology

## 2020-10-05 ENCOUNTER — Other Ambulatory Visit: Payer: Self-pay

## 2020-10-05 ENCOUNTER — Ambulatory Visit (AMBULATORY_SURGERY_CENTER): Payer: Medicare Other | Admitting: Gastroenterology

## 2020-10-05 VITALS — BP 102/59 | HR 55 | Temp 98.7°F | Resp 13 | Ht 63.0 in | Wt 185.0 lb

## 2020-10-05 DIAGNOSIS — K219 Gastro-esophageal reflux disease without esophagitis: Secondary | ICD-10-CM | POA: Diagnosis not present

## 2020-10-05 DIAGNOSIS — R1013 Epigastric pain: Secondary | ICD-10-CM

## 2020-10-05 DIAGNOSIS — K3189 Other diseases of stomach and duodenum: Secondary | ICD-10-CM | POA: Diagnosis not present

## 2020-10-05 DIAGNOSIS — K319 Disease of stomach and duodenum, unspecified: Secondary | ICD-10-CM | POA: Diagnosis not present

## 2020-10-05 MED ORDER — SODIUM CHLORIDE 0.9 % IV SOLN
500.0000 mL | Freq: Once | INTRAVENOUS | Status: DC
Start: 2020-10-05 — End: 2020-10-05

## 2020-10-05 NOTE — Op Note (Signed)
[facility] Patient Name: Lauren Chapman Procedure Date: 10/05/2020 1:21 PM MRN: [id number] Endoscopist: Ladene Artist , MD Age: 50 Referring MD:  Date of Birth: 05/14/1970 Gender: Female Account #: [id number] Procedure:                Upper GI endoscopy Indications:              Epigastric abdominal pain, Gastroesophageal reflux                            disease Medicines:                Monitored Anesthesia Care Procedure:                Pre-Anesthesia Assessment:                           - Prior to the procedure, a History and Physical                            was performed, and patient medications and                            allergies were reviewed. The patient's tolerance of                            previous anesthesia was also reviewed. The risks                            and benefits of the procedure and the sedation                            options and risks were discussed with the patient.                            All questions were answered, and informed consent                            was obtained. Prior Anticoagulants: The patient has                            taken no previous anticoagulant or antiplatelet                            agents. ASA Grade Assessment: III - A patient with                            severe systemic disease. After reviewing the risks                            and benefits, the patient was deemed in                            satisfactory condition to undergo the procedure.  After obtaining informed consent, the endoscope was                            passed under direct vision. Throughout the                            procedure, the patient's blood pressure, pulse, and                            oxygen saturations were monitored continuously. The                            GIF [id number] was introduced through the                            mouth, and advanced to the second part of  duodenum.                            The upper GI endoscopy was accomplished without                            difficulty. The patient tolerated the procedure                            well. Scope In: Scope Out: Findings:                 The examined esophagus was normal.                           Patchy mildly erythematous mucosa without bleeding                            was found in the entire examined stomach. Biopsies                            were taken with a cold forceps for histology.                           The exam of the stomach was otherwise normal.                           The duodenal bulb and second portion of the                            duodenum were normal. Complications:            No immediate complications. Estimated Blood Loss:     Estimated blood loss was minimal. Impression:               - Normal esophagus.                           - Erythematous mucosa in the stomach. Biopsied.                           -  Normal duodenal bulb and second portion of the                            duodenum. Recommendation:           - Patient has a contact number available for                            emergencies. The signs and symptoms of potential                            delayed complications were discussed with the                            patient. Return to normal activities tomorrow.                            Written discharge instructions were provided to the                            patient.                           - Resume previous diet.                           - Follow antireflux measures.                           - Continue present medications.                           - Await pathology results.                           - Return to GI office in 6 weeks. Ladene Artist, MD 10/05/2020 2:10:26 PM This report has been signed electronically.

## 2020-10-05 NOTE — Progress Notes (Signed)
  See 10/04/2020 H&P, no changes. This patient is appropriate for endoscopic procedures in the ambulatory setting.

## 2020-10-05 NOTE — Progress Notes (Signed)
Report to PACU, RN, vss, BBS= Clear.  

## 2020-10-05 NOTE — Patient Instructions (Signed)
YOU HAD AN ENDOSCOPIC PROCEDURE TODAY AT [facility]:   Refer to the procedure report that was given to you for any specific questions about what was found during the examination.  If the procedure report does not answer your questions, please call your gastroenterologist to clarify.  If you requested that your care partner not be given the details of your procedure findings, then the procedure report has been included in a sealed envelope for you to review at your convenience later.  **Handout given on GERD**  YOU SHOULD EXPECT: Some feelings of bloating in the abdomen. Passage of more gas than usual.  Walking can help get rid of the air that was put into your GI tract during the procedure and reduce the bloating. If you had a lower endoscopy (such as a colonoscopy or flexible sigmoidoscopy) you may notice spotting of blood in your stool or on the toilet paper. If you underwent a bowel prep for your procedure, you may not have a normal bowel movement for a few days.  Please Note:  You might notice some irritation and congestion in your nose or some drainage.  This is from the oxygen used during your procedure.  There is no need for concern and it should clear up in a day or so.  SYMPTOMS TO REPORT IMMEDIATELY:  Following upper endoscopy (EGD)  Vomiting of blood or coffee ground material  New chest pain or pain under the shoulder blades  Painful or persistently difficult swallowing  New shortness of breath  Fever of 100F or higher  Black, tarry-looking stools  For urgent or emergent issues, a gastroenterologist can be reached at any hour by calling [phone]. Do not use MyChart messaging for urgent concerns.    DIET:  We do recommend a small meal at first, but then you may proceed to your regular diet.  Drink plenty of fluids but you should avoid alcoholic beverages for 24 hours.  ACTIVITY:  You should plan to take it easy for the rest of today and you should NOT DRIVE  or use heavy machinery until tomorrow (because of the sedation medicines used during the test).    FOLLOW UP: Our staff will call the number listed on your records 48-72 hours following your procedure to check on you and address any questions or concerns that you may have regarding the information given to you following your procedure. If we do not reach you, we will leave a message.  We will attempt to reach you two times.  During this call, we will ask if you have developed any symptoms of COVID 19. If you develop any symptoms (ie: fever, flu-like symptoms, shortness of breath, cough etc.) before then, please call [phone].  If you test positive for Covid 19 in the 2 weeks post procedure, please call and report this information to [place].    If any biopsies were taken you will be contacted by phone or by letter within the next 1-3 weeks.  Please call us at [phone] if you have not heard about the biopsies in 3 weeks.    SIGNATURES/CONFIDENTIALITY: You and/or your care partner have signed paperwork which will be entered into your electronic medical record.  These signatures attest to the fact that that the information above on your After Visit Summary has been reviewed and is understood.  Full responsibility of the confidentiality of this discharge information lies with you and/or your care-partner.

## 2020-10-05 NOTE — Progress Notes (Signed)
VS completed by AS.    Medical history reviewed and updated.

## 2020-10-05 NOTE — Progress Notes (Signed)
Called to room to assist during endoscopic procedure.  Patient ID and intended procedure confirmed with present staff. Received instructions for my participation in the procedure from the performing physician.  

## 2020-10-07 ENCOUNTER — Telehealth: Payer: Self-pay | Admitting: *Deleted

## 2020-10-07 NOTE — Telephone Encounter (Signed)
  Follow up Call-  Call back number 10/05/2020 07/08/2020  Post procedure Call Back phone  # [phone]  Permission to leave phone message Yes Yes  Some recent data might be hidden     Patient questions:  Do you have a fever, pain , or abdominal swelling? No. Pain Score  0 *  Have you tolerated food without any problems? Yes.    Have you been able to return to your normal activities? Yes.    Do you have any questions about your discharge instructions: Diet   No. Medications  No. Follow up visit  No.  Do you have questions or concerns about your Care? No.  Actions: * If pain score is 4 or above: No action needed, pain <4.Have you developed a fever since your procedure? no  2.   Have you had an respiratory symptoms (SOB or cough) since your procedure? no  3.   Have you tested positive for COVID 19 since your procedure no  4.   Have you had any family members/close contacts diagnosed with the COVID 19 since your procedure?  no   If yes to any of these questions please route to Joylene John, RN and Joella Prince, RN

## 2020-10-17 LAB — HM HEPATITIS C SCREENING LAB: HM Hepatitis Screen: NEGATIVE

## 2020-10-20 ENCOUNTER — Encounter: Payer: Self-pay | Admitting: Gastroenterology

## 2020-10-22 ENCOUNTER — Other Ambulatory Visit: Payer: Self-pay | Admitting: Nephrology

## 2020-10-22 DIAGNOSIS — N281 Cyst of kidney, acquired: Secondary | ICD-10-CM

## 2020-10-22 DIAGNOSIS — R7989 Other specified abnormal findings of blood chemistry: Secondary | ICD-10-CM

## 2020-10-26 ENCOUNTER — Encounter: Payer: Self-pay | Admitting: Family Medicine

## 2020-10-26 ENCOUNTER — Ambulatory Visit
Admission: RE | Admit: 2020-10-26 | Discharge: 2020-10-26 | Disposition: A | Discharge status: Home / Self Care | Payer: Medicare Other | Source: Ambulatory Visit | Attending: Nephrology | Admitting: Nephrology

## 2020-10-26 DIAGNOSIS — N281 Cyst of kidney, acquired: Secondary | ICD-10-CM

## 2020-10-26 DIAGNOSIS — R7989 Other specified abnormal findings of blood chemistry: Secondary | ICD-10-CM

## 2020-11-24 ENCOUNTER — Ambulatory Visit: Payer: Medicare Other | Admitting: Gastroenterology

## 2020-12-06 ENCOUNTER — Ambulatory Visit: Payer: Medicare Other | Admitting: Neurology

## 2020-12-08 NOTE — Progress Notes (Signed)
PATIENT: Lauren Chapman DOB: [birth year]  REASON FOR VISIT: follow up HISTORY FROM: patient  Chief Complaint  Patient presents with   Obstructive Sleep Apnea    Rm 16, alone. Here for CPAP f/u. Pt reports doing well on CPAP. No issues or concerns.      HISTORY OF PRESENT ILLNESS:  12/09/20 ALL:  Lauren Chapman is a 50 y.o. female here today for follow up for OSA on CPAP. HST 08/2020 showed mild OSA with AHI 10.5/hr and O2 nadir 81%. She has done well with CPAP therapy. She is tolerating therapy. She is sleeping better. She may wake up during the night and not being able to go back to sleep. She will get up and drink a couple cups of coffee and usually goes back to sleep. She seems to sleep "really heavy" at this time. Sleepwalking is not occurring as frequently. She does continues to have daytime sleepiness. She is able to go to sleep in no time. She continues to follow up closely with psychiatry for schizoaffective disorder.   Please see CMA note for compliance report   HISTORY: (copied from Dr Guadelupe Sabin previous note)  Dear Dr. Carlota Raspberry,   I saw your patient, Lauren Chapman, upon your kind request in my today for initial consultation of her sleep disorder, in particular, concern for underlying obstructive sleep apnea and history of sleepwalking since childhood.  The patient is unaccompanied today.  As you know, Lauren Chapman is a 50 year old right-handed woman with an underlying medical history of allergies, anxiety, depression, emphysema, reflux disease, kidney cyst, migraine headaches (for which she is followed by my colleague Dr. Krista Blue), and obesity, who reports snoring and excessive daytime somnolence as well as witnessed apneas.  She reports a longstanding history of sleepwalking since childhood.  She recently had a fall as she got out of bed.  She hit her head.  She was evaluated in the emergency room and had a head CT and cervical spine CT without contrast on 06/30/2020 and I reviewed the  results: IMPRESSION: No acute intracranial abnormality seen.   Multilevel degenerative changes are noted. No acute abnormality seen in the cervical spine.    I reviewed your office note from 07/05/2020. Her Epworth sleepiness score is 20 out of 24, fatigue severity score is 49 out of 63.  Of note, she was on multiple potentially sedating medications including Abilify, Celexa, clonazepam, Neurontin, Vistaril, Tofranil, Lamictal, Remeron.  She is followed by psychiatry.  She was in inpatient care and is now followed as an outpatient.  She lives alone, her youngest daughter recently moved out.  She reports a longstanding history of sleepwalking, sleep talking, and sleep-related eating since childhood.  She has fallen while sleepwalking.  She tries to wake herself up but cannot do so and ends up falling and has hurt herself including her right knee some years ago.  She falls asleep quickly and has been sleepy at the wheel.  She currently does not work.  Bedtime is generally between 930 and 10 and rise time around 5 AM.  She has been told by her daughters that she makes choking sounds and pauses while asleep.  She has woken up with a sense of gasping or snorting.  She has occasionally woken up with a headache.  She does not have any night to night nocturia, is not aware of any family history of sleep apnea.  Weight has been fluctuating.  She smokes about half a pack per day, does not  drink any alcohol and drinks caffeine in the form of coffee, about 2 cups in the mornings.   REVIEW OF SYSTEMS: Out of a complete 14 system review of symptoms, the patient complains only of the following symptoms, daytime sleepiness, sleepwalking and all other reviewed systems are negative.  ESS:  ALLERGIES: Allergies  Allergen Reactions   Codeine Anaphylaxis    Anything with codeine    HOME MEDICATIONS: Outpatient Medications Prior to Visit  Medication Sig Dispense Refill   albuterol (VENTOLIN HFA) 108 (90 Base)  MCG/ACT inhaler Inhale 2 puffs into the lungs every 4 (four) hours as needed for wheezing or shortness of breath. 1 each 1   ARIPiprazole (ABILIFY) 20 MG tablet Take 20 mg by mouth at bedtime.     aspirin-acetaminophen-caffeine (EXCEDRIN MIGRAINE) 250-250-65 MG tablet Take 2 tablets by mouth every 6 (six) hours as needed for headache.     busPIRone (BUSPAR) 15 MG tablet Take 15 mg by mouth 3 (three) times daily.     citalopram (CELEXA) 20 MG tablet Take 20 mg by mouth daily.     esomeprazole (NEXIUM) 20 MG capsule Take 1 capsule (20 mg total) by mouth 2 (two) times daily before a meal. (Patient taking differently: Take 20 mg by mouth 2 (two) times daily as needed (indigestion).) 60 capsule 1   gabapentin (NEURONTIN) 800 MG tablet Take 800 mg by mouth 3 (three) times daily.      Galcanezumab-gnlm (EMGALITY) 120 MG/ML SOAJ Inject 120 mg into the skin every 30 (thirty) days. 3 mL 3   lamoTRIgine (LAMICTAL) 150 MG tablet Take 150 mg by mouth 2 (two) times daily.      ondansetron (ZOFRAN) 4 MG tablet Take 1 tablet (4 mg total) by mouth every 8 (eight) hours as needed for nausea or vomiting. 20 tablet 6   rizatriptan (MAXALT) 10 MG tablet Take 1 tab at onset of migraine.  May repeat in 2 hrs, if needed.  Max dose: 2 tabs/day. This is a 30 day prescription. (Patient taking differently: Take 1 tab at onset of migraine.  May repeat in 2 hrs, if needed.  Max dose: 2 tabs/day. This is a 30 day prescription.) 12 tablet 6   SUMAtriptan (IMITREX) 25 MG tablet Take 1 tablet (25 mg total) by mouth every 2 (two) hours as needed for migraine. May repeat in 2 hours if headache persists or recurs. 10 tablet 6   tiZANidine (ZANAFLEX) 4 MG tablet Take 1 tablet (4 mg total) by mouth every 6 (six) hours as needed for muscle spasms. TAKE 1 TABLET BY MOUTH EVERY 6 HOURS AS NEEDED FOR MUSCLE SPASMS. 30 tablet 6   Vibegron (GEMTESA) 75 MG TABS Take by mouth.     tolterodine (DETROL LA) 4 MG 24 hr capsule Take 4 mg by mouth daily.      mirtazapine (REMERON) 15 MG tablet Take 15 mg by mouth at bedtime. (Patient not taking: Reported on 10/05/2020)     No facility-administered medications prior to visit.    PAST MEDICAL HISTORY: Past Medical History:  Diagnosis Date   Allergy    Anxiety    Bipolar affective psychosis ([place])    Complication of anesthesia    " Acting silly- waving at everyone"-Giddy   Depression    Emphysema lung ([facility])    inhaler prn,  followed by pcp and pulmonologist-- dr Margorie John   Family history of adverse reaction to anesthesia    Father had rheumatic fever, and died on operating table.  GERD (gastroesophageal reflux disease)    Kidney cysts 01/24/2020   Right Kidney   Migraine    Smokers' cough ([place])    per pt not productive   SUI (stress urinary incontinence, female)    Tubular adenoma of colon 07/08/2020   Wears glasses     PAST SURGICAL HISTORY: Past Surgical History:  Procedure Laterality Date   ABDOMINAL HYSTERECTOMY N/A    [facility]reesia 11/30/2019   CARPAL TUNNEL RELEASE Right 09-12-2007   @[facility]    and GANGLION CYST EXCISION   COLONOSCOPY     CYSTOSCOPY N/A 04/29/2019   Procedure: CYSTOSCOPY;  Surgeon: Joseph Pierini, MD;  Location: [facility];  Service: Gynecology;  Laterality: N/A;   DILATATION & CURETTAGE/HYSTEROSCOPY WITH MYOSURE N/A 11/13/2018   Procedure: DILATATION & CURETTAGE/HYSTEROSCOPY WITH MYOSURE;  Surgeon: Anastasio Auerbach, MD;  Location: [facility];  Service: Gynecology;  Laterality: N/A;  request 9:00am OR start time in [place] block requests one hour   [place]  02/2019   EYE SURGERY N/A    Phreesia 11/30/2019   GANGLION CYST EXCISION     R wrist   KNEE ARTHROSCOPY WITH ANTERIOR CRUCIATE LIGAMENT (ACL) REPAIR Right 10/2015   LAPAROSCOPIC CHOLECYSTECTOMY  1997   LAPAROSCOPIC HYSTERECTOMY Bilateral 04/29/2019   Procedure: HYSTERECTOMY TOTAL LAPAROSCOPIC, BILATERAL SALPINO-OOPHORECTOMY;  Surgeon:  Joseph Pierini, MD;  Location: [facility];  Service: Gynecology;  Laterality: Bilateral;   TOOTH EXTRACTION  04/01/2019   UPPER GASTROINTESTINAL ENDOSCOPY      FAMILY HISTORY: Family History  Problem Relation Age of Onset   Depression Mother    Cancer Mother        Cervical   Alcohol abuse Father    Crohn's disease Sister    Stroke Brother 54   Migraines Daughter    GER disease Daughter    Depression Daughter    Migraines Daughter    GER disease Daughter    Colon cancer Neg Hx    Rectal cancer Neg Hx    Stomach cancer Neg Hx     SOCIAL HISTORY: Social History   Socioeconomic History   Marital status: Divorced    Spouse name: n/a   Number of children: 2   Years of education: 18   Highest education level: Not on file  Occupational History   Occupation: Disabled    Comment: Formerly a Pharmacist, hospital.  Tobacco Use   Smoking status: Every Day    Packs/day: 0.50    Years: 33.00    Pack years: 16.50    Types: Cigarettes    Start date: 10/10/1981    Last attempt to quit: 11/10/2019    Years since quitting: 1.0   Smokeless tobacco: Never  Vaping Use   Vaping Use: Never used  Substance and Sexual Activity   Alcohol use: Never    Alcohol/week: 0.0 standard drinks   Drug use: Never   Sexual activity: Not Currently    Partners: Male    Birth control/protection: Post-menopausal    Comment: 1st intercourse 50 yo-More than 5 partners  Other Topics Concern   Not on file  Social History Narrative   Lives at home with 1 of her two daughters   Right-handed.   2-4 cups caffeine daily.   Disabled    Social Determinants of Health   Financial Resource Strain: Not on file  Food Insecurity: Not on file  Transportation Needs: Not on file  Physical Activity: Not on file  Stress: Not on  file  Social Connections: Not on file  Intimate Partner Violence: Not on file     PHYSICAL EXAM  Vitals:   12/09/20 1044  BP: 117/78  Pulse: 80  Weight: 187 lb 11.2 oz  (85.1 kg)  Height: 5\' 3"  (1.6 m)   Body mass index is 33.25 kg/m.  Generalized: Well developed, in no acute distress  Cardiology: normal rate and rhythm, no murmur noted Respiratory: clear to auscultation bilaterally  Neurological examination  Mentation: Alert oriented to time, place, history taking. Follows all commands speech and language fluent Cranial nerve II-XII: Pupils were equal round reactive to light. Extraocular movements were full, visual field were full  Motor: The motor testing reveals 5 over 5 strength of all 4 extremities. Good symmetric motor tone is noted throughout.  Gait and station: Gait is normal.    DIAGNOSTIC DATA (LABS, IMAGING, TESTING) - I reviewed patient records, labs, notes, testing and imaging myself where available.  No flowsheet data found.   Lab Results  Component Value Date   WBC 10.0 05/31/2020   HGB 13.9 05/31/2020   HCT 41.1 05/31/2020   MCV 90.4 05/31/2020   PLT 215.0 05/31/2020      Component Value Date/Time   NA 139 05/31/2020 0957   NA 143 03/24/2019 1510   K 4.3 05/31/2020 0957   CL 102 05/31/2020 0957   CO2 28 05/31/2020 0957   GLUCOSE 79 05/31/2020 0957   BUN 11 05/31/2020 0957   BUN 10 03/24/2019 1510   CREATININE 1.01 05/31/2020 0957   CREATININE 1.12 (H) 08/17/2015 1608   CALCIUM 9.5 05/31/2020 0957   PROT 7.5 05/31/2020 0957   PROT 6.9 03/24/2019 1510   ALBUMIN 4.5 05/31/2020 0957   ALBUMIN 4.4 03/24/2019 1510   AST 31 05/31/2020 0957   ALT 36 (H) 05/31/2020 0957   ALKPHOS 86 05/31/2020 0957   BILITOT 0.4 05/31/2020 0957   BILITOT <0.2 03/24/2019 1510   GFRNONAA 53 (L) 03/14/2020 1700   GFRAA 71 03/24/2019 1510   Lab Results  Component Value Date   CHOL 175 05/31/2020   HDL 41.40 05/31/2020   LDLCALC 99 05/31/2020   TRIG 171.0 (H) 05/31/2020   CHOLHDL 4 05/31/2020   Lab Results  Component Value Date   HGBA1C 5.7 05/31/2020   No results found for: VITAMINB12 Lab Results  Component Value Date   TSH  0.86 09/09/2018     ASSESSMENT AND PLAN 50 y.o. year old female  has a past medical history of Allergy, Anxiety, Bipolar affective psychosis ([place]), Complication of anesthesia, Depression, Emphysema lung ([place]), Family history of adverse reaction to anesthesia, GERD (gastroesophageal reflux disease), Kidney cysts (01/24/2020), Migraine, Smokers' cough ([place]), SUI (stress urinary incontinence, female), Tubular adenoma of colon (07/08/2020), and Wears glasses. here with     ICD-10-CM   1. OSA (obstructive sleep apnea)  G47.33         Lauren Chapman is doing well on CPAP therapy. Compliance report reveals excellent compliance. She was encouraged to continue using CPAP nightly and for greater than 4 hours each night. We will update supply orders as indicated. Risks of untreated sleep apnea review and education materials provided. She does have daytime sleepiness. She is on multiple mood medicaitons that can have sedating side effects. She was encouraged to focus on sleep hygiene. Healthy lifestyle habits encouraged. She will follow up in 1 year, sooner if needed. She verbalizes understanding and agreement with this plan.    No orders of the defined types  were placed in this encounter.    No orders of the defined types were placed in this encounter.     Debbora Presto, FNP-C 12/09/2020, 11:08 AM [facility] [address], [place], [place] [address] [phone]

## 2020-12-08 NOTE — Patient Instructions (Addendum)

## 2020-12-09 ENCOUNTER — Encounter: Payer: Self-pay | Admitting: Family Medicine

## 2020-12-09 ENCOUNTER — Ambulatory Visit (INDEPENDENT_AMBULATORY_CARE_PROVIDER_SITE_OTHER): Payer: Medicare Other | Admitting: Family Medicine

## 2020-12-09 VITALS — BP 117/78 | HR 80 | Ht 63.0 in | Wt 187.7 lb

## 2020-12-09 DIAGNOSIS — G4733 Obstructive sleep apnea (adult) (pediatric): Secondary | ICD-10-CM | POA: Diagnosis not present

## 2020-12-10 ENCOUNTER — Encounter: Payer: Self-pay | Admitting: Family Medicine

## 2020-12-10 ENCOUNTER — Ambulatory Visit (INDEPENDENT_AMBULATORY_CARE_PROVIDER_SITE_OTHER): Payer: Medicare Other | Admitting: Family Medicine

## 2020-12-10 VITALS — BP 134/76 | HR 75 | Temp 98.2°F | Resp 15 | Ht 63.0 in | Wt 187.0 lb

## 2020-12-10 DIAGNOSIS — G43109 Migraine with aura, not intractable, without status migrainosus: Secondary | ICD-10-CM | POA: Diagnosis not present

## 2020-12-10 MED ORDER — SUMATRIPTAN SUCCINATE 25 MG PO TABS: 25.0000 mg | ORAL_TABLET | ORAL | 6 refills | Status: DC | PRN

## 2020-12-10 MED ORDER — TIZANIDINE HCL 4 MG PO TABS: 4.0000 mg | ORAL_TABLET | Freq: Four times a day (QID) | ORAL | 6 refills | Status: DC | PRN

## 2020-12-10 NOTE — Progress Notes (Signed)
Subjective:  Patient ID: Lauren Chapman, female    DOB: Jun 11, 1970  Age: 50 y.o. MRN: [id number]  CC:  Chief Complaint  Patient presents with   Migraine    Pt reports needs refill on imatrex as well as tizanidine for control, notes does not take rizatriptan due to concerns in the past    Immunizations    Pt due for 2nd shingles vaccine     HPI Lauren Chapman presents for   Migraine Headache Chronic migraine with aura (weird smell, light around left eye)without status.  Neurologist Dr. Siri Cole.  Previous injections with Botox, Inderal treatment, treated by Dr. Posey Pronto.  Control had improved with Emgality and Maxalt as needed when discussed in July.  There was a discussion for a sleep study after minimal sleep specialist in June, but had not yet had study and July visit.  She was taking sumatriptan once per week, then rizatriptan 1 day/week depending on migraine versus stress migraine.  Just taking 1 or the other triptan at last visit.  Saw sleep specialist yesterday.  HST 08/25/20 - O2 nadir 81%, AHI 10.5 Started CPAP --tolerating. Feels like sleeping better, less sleepwalking (2-3 /night now 1-2 per week).  Unsure if change in HA frequency.  Still taking Emgality, migraine approx 3 times per week - takes imitrex.  Off tizanidine  for few months. Took in past with zofran and imitrex with migraine - worked better. Released to PCP form prior neurologist for migraine tx. Same headaches as in past, no new symptoms.  Tunnel vision at most severe HA. Has discussed with optho  - not thought to be due to eyes.    Immunization History  Administered Date(s) Administered   Influenza Inj Mdck Quad Pf 01/03/2019   Influenza Split 10/11/2011   Influenza,inj,Quad PF,6+ Mos 12/04/2017, 12/01/2019   Influenza-Unspecified 01/03/2019   PFIZER(Purple Top)SARS-COV-2 Vaccination 04/19/2019, 05/10/2019, 01/22/2020, 10/26/2020   PNEUMOCOCCAL CONJUGATE-20 08/02/2020   Tdap 10/16/2018   Zoster Recombinat  (Shingrix) 05/31/2020, 08/02/2020    History Patient Active Problem List   Diagnosis Date Noted   Depression 03/24/2020   Bilateral low back pain with left-sided sciatica 03/24/2020   Left lumbar radiculopathy 02/25/2020   Absence of bladder continence 02/25/2020   History of postmenopausal bleeding 04/29/2019   Polypharmacy 04/03/2019   Emphysema, unspecified ([place]) 05/17/2018   Weakness 01/25/2017   Chronic migraine w/o aura w/o status migrainosus, not intractable 12/22/2014   Encounter for other general counseling or advice on contraception 09/02/2014   Bipolar disorder ([place]) 10/12/2011   Anxiety and depression 10/12/2011   GERD (gastroesophageal reflux disease) 10/12/2011   Past Medical History:  Diagnosis Date   Allergy    Anxiety    Bipolar affective psychosis ([place])    Complication of anesthesia    " Acting silly- waving at everyone"-Giddy   Depression    Emphysema lung ([place])    inhaler prn,  followed by pcp and pulmonologist-- dr Margorie John   Family history of adverse reaction to anesthesia    Father had rheumatic fever, and died on operating table.   GERD (gastroesophageal reflux disease)    Kidney cysts 01/24/2020   Right Kidney   Migraine    Smokers' cough ([place])    per pt not productive   SUI (stress urinary incontinence, female)    Tubular adenoma of colon 07/08/2020   Wears glasses    Past Surgical History:  Procedure Laterality Date   ABDOMINAL HYSTERECTOMY N/A    Phreesia 11/30/2019   CARPAL  TUNNEL RELEASE Right 09-12-2007   @[facility]    and GANGLION CYST EXCISION   COLONOSCOPY     CYSTOSCOPY N/A 04/29/2019   Procedure: CYSTOSCOPY;  Surgeon: Joseph Pierini, MD;  Location: [facility];  Service: Gynecology;  Laterality: N/A;   DILATATION & CURETTAGE/HYSTEROSCOPY WITH MYOSURE N/A 11/13/2018   Procedure: DILATATION & CURETTAGE/HYSTEROSCOPY WITH MYOSURE;  Surgeon: Anastasio Auerbach, MD;  Location: [place];  Service:  Gynecology;  Laterality: N/A;  request 9:00am OR start time in [place] block requests one hour   [place]  02/2019   EYE SURGERY N/A    Phreesia 11/30/2019   GANGLION CYST EXCISION     R wrist   KNEE ARTHROSCOPY WITH ANTERIOR CRUCIATE LIGAMENT (ACL) REPAIR Right 10/2015   LAPAROSCOPIC CHOLECYSTECTOMY  1997   LAPAROSCOPIC HYSTERECTOMY Bilateral 04/29/2019   Procedure: HYSTERECTOMY TOTAL LAPAROSCOPIC, BILATERAL SALPINO-OOPHORECTOMY;  Surgeon: Joseph Pierini, MD;  Location: [place];  Service: Gynecology;  Laterality: Bilateral;   TOOTH EXTRACTION  04/01/2019   UPPER GASTROINTESTINAL ENDOSCOPY     Allergies  Allergen Reactions   Codeine Anaphylaxis    Anything with codeine   Prior to Admission medications   Medication Sig Start Date End Date Taking? Authorizing Provider  albuterol (VENTOLIN HFA) 108 (90 Base) MCG/ACT inhaler Inhale 2 puffs into the lungs every 4 (four) hours as needed for wheezing or shortness of breath. 02/20/20  Yes Olalere, Adewale A, MD  ARIPiprazole (ABILIFY) 20 MG tablet Take 20 mg by mouth at bedtime. 06/22/20  Yes [provider]  aspirin-acetaminophen-caffeine (EXCEDRIN MIGRAINE) 8040540602 MG tablet Take 2 tablets by mouth every 6 (six) hours as needed for headache.   Yes [provider]  busPIRone (BUSPAR) 15 MG tablet Take 15 mg by mouth 3 (three) times daily. 07/30/20  Yes [provider]  citalopram (CELEXA) 20 MG tablet Take 20 mg by mouth daily.   Yes [provider]  esomeprazole (NEXIUM) 20 MG capsule Take 1 capsule (20 mg total) by mouth 2 (two) times daily before a meal. Patient taking differently: Take 20 mg by mouth 2 (two) times daily as needed (indigestion). 03/21/19  Yes Jacelyn Pi, Lilia Argue, MD  gabapentin (NEURONTIN) 800 MG tablet Take 800 mg by mouth 3 (three) times daily.  07/02/18  Yes [provider]  Galcanezumab-gnlm (EMGALITY) 120 MG/ML SOAJ Inject 120  mg into the skin every 30 (thirty) days. 03/24/20  Yes Marcial Pacas, MD  lamoTRIgine (LAMICTAL) 150 MG tablet Take 150 mg by mouth 2 (two) times daily.    Yes [provider]  ondansetron (ZOFRAN) 4 MG tablet Take 1 tablet (4 mg total) by mouth every 8 (eight) hours as needed for nausea or vomiting. 01/05/20  Yes Suzzanne Cloud, NP  SUMAtriptan (IMITREX) 25 MG tablet Take 1 tablet (25 mg total) by mouth every 2 (two) hours as needed for migraine. May repeat in 2 hours if headache persists or recurs. 05/31/20  Yes Wendie Agreste, MD  tiZANidine (ZANAFLEX) 4 MG tablet Take 1 tablet (4 mg total) by mouth every 6 (six) hours as needed for muscle spasms. TAKE 1 TABLET BY MOUTH EVERY 6 HOURS AS NEEDED FOR MUSCLE SPASMS. 01/05/20  Yes Suzzanne Cloud, NP  Vibegron (GEMTESA) 75 MG TABS Take by mouth.   Yes [provider]  rizatriptan (MAXALT) 10 MG tablet Take 1 tab at onset of migraine.  May repeat in 2 hrs, if needed.  Max dose: 2  tabs/day. This is a 30 day prescription. Patient not taking: Reported on 12/10/2020 05/31/20   Wendie Agreste, MD   Social History   Socioeconomic History   Marital status: Divorced    Spouse name: n/a   Number of children: 2   Years of education: 55   Highest education level: Not on file  Occupational History   Occupation: Disabled    Comment: Formerly a Pharmacist, hospital.  Tobacco Use   Smoking status: Every Day    Packs/day: 0.50    Years: 33.00    Pack years: 16.50    Types: Cigarettes    Start date: 10/10/1981    Last attempt to quit: 11/10/2019    Years since quitting: 1.0   Smokeless tobacco: Never  Vaping Use   Vaping Use: Never used  Substance and Sexual Activity   Alcohol use: Never    Alcohol/week: 0.0 standard drinks   Drug use: Never   Sexual activity: Not Currently    Partners: Male    Birth control/protection: Post-menopausal    Comment: 1st intercourse 50 yo-More than 5 partners  Other Topics Concern   Not on file  Social History  Narrative   Lives at home with 1 of her two daughters   Right-handed.   2-4 cups caffeine daily.   Disabled    Social Determinants of Health   Financial Resource Strain: Not on file  Food Insecurity: Not on file  Transportation Needs: Not on file  Physical Activity: Not on file  Stress: Not on file  Social Connections: Not on file  Intimate Partner Violence: Not on file    Review of Systems   Objective:   Vitals:   12/10/20 1030  BP: 134/76  Pulse: 75  Resp: 15  Temp: 98.2 F (36.8 C)  TempSrc: Temporal  SpO2: 97%  Weight: 187 lb (84.8 kg)  Height: 5\' 3"  (1.6 m)     Physical Exam Vitals reviewed.  Constitutional:      Appearance: Normal appearance. She is well-developed.  HENT:     Head: Normocephalic and atraumatic.  Eyes:     Conjunctiva/sclera: Conjunctivae normal.     Pupils: Pupils are equal, round, and reactive to light.  Neck:     Vascular: No carotid bruit.  Cardiovascular:     Rate and Rhythm: Normal rate and regular rhythm.     Heart sounds: Normal heart sounds.  Pulmonary:     Effort: Pulmonary effort is normal.     Breath sounds: Normal breath sounds.  Abdominal:     Palpations: Abdomen is soft. There is no pulsatile mass.     Tenderness: There is no abdominal tenderness.  Musculoskeletal:     Right lower leg: No edema.     Left lower leg: No edema.  Skin:    General: Skin is warm and dry.  Neurological:     General: No focal deficit present.     Mental Status: She is alert and oriented to person, place, and time. Mental status is at baseline.     GCS: GCS eye subscore is 4. GCS verbal subscore is 5. GCS motor subscore is 6.     Cranial Nerves: No cranial nerve deficit, dysarthria or facial asymmetry.     Sensory: No sensory deficit.     Motor: No weakness or pronator drift.     Coordination: Coordination normal.     Gait: Gait normal.  Psychiatric:        Mood and Affect: Mood normal.  Behavior: Behavior normal.     Assessment & Plan:  Lauren Chapman is a 50 y.o. female . Migraine with aura and without status migrainosus, not intractable - Plan: SUMAtriptan (IMITREX) 25 MG tablet, tiZANidine (ZANAFLEX) 4 MG tablet, AMB referral to headache clinic Few episodes of migraine per week on Emgality.  Referral placed to headache specialist for second opinion on change in meds or adjustment of meds.  We will continue Emgality, Imitrex, tizanidine if needed for flare along with Zofran.  RTC precautions.  Meds ordered this encounter  Medications   SUMAtriptan (IMITREX) 25 MG tablet    Sig: Take 1 tablet (25 mg total) by mouth every 2 (two) hours as needed for migraine. May repeat in 2 hours if headache persists or recurs.    Dispense:  10 tablet    Refill:  6   tiZANidine (ZANAFLEX) 4 MG tablet    Sig: Take 1 tablet (4 mg total) by mouth every 6 (six) hours as needed for muscle spasms. TAKE 1 TABLET BY MOUTH EVERY 6 HOURS AS NEEDED FOR MUSCLE SPASMS.    Dispense:  30 tablet    Refill:  6   Patient Instructions  I will refill tizanidine, remain on Emgality, and Imitrex if needed for migraine, but I will refer you to headache specialist to manage your migraine and meds.   Recheck in 3 months. Let me know if any questions sooner.   Return to the clinic or go to the nearest emergency room if any of your symptoms worsen or new symptoms occur.     Signed,   Merri Ray, MD [place] 12/10/20 11:23 AM

## 2020-12-10 NOTE — Patient Instructions (Addendum)
I will refill tizanidine, remain on Emgality, and Imitrex if needed for migraine, but I will refer you to headache specialist to manage your migraine and meds.   Recheck in 3 months. Let me know if any questions sooner.   Return to the clinic or go to the nearest emergency room if any of your symptoms worsen or new symptoms occur.

## 2020-12-13 ENCOUNTER — Ambulatory Visit: Payer: Medicare Other | Admitting: Neurology

## 2020-12-20 NOTE — Progress Notes (Deleted)
NEUROLOGY CONSULTATION NOTE  Lauren Chapman MRN: [id number] DOB: November 30, 1970  Referring provider: Merri Ray, MD Primary care provider: Merri Ray, MD  Reason for consult:  migraines  Assessment/Plan:   ***   Subjective:  Lauren Chapman is a 50 year old right-handed female with Bipolar affective disorder, emphysema and anxiety who presents for migraines.  History supplemented by prior neurologist's and referring provider's notes.  MRI of brain, cervical, thoracic, and lumbar spine in February 2022 personally reviewed.  Onset:  50 years old Location:  unilateral either side Quality:  pounding Intensity:  Severe.  She denies new headache, thunderclap headache or severe headache that wakes *** from sleep. Aura:  *** Prodrome:  *** Postdrome:  *** Associated symptoms:  ***.  *** denies associated unilateral numbness or weakness. Duration:  1 to 2 days Frequency:  *** Frequency of abortive medication: *** Triggers:  *** Relieving factors:  *** Activity:  ***  Current NSAIDS/analgesics:  Excedrin Migraine Current triptans:  sumatriptan 25mg  Current ergotamine:  none Current anti-emetic:  Zofran 4mg  Current muscle relaxants:  tizanidine 4mg  PRN Current Antihypertensive medications:  none Current Antidepressant medications:  citalopram 20mg  daily Current Anticonvulsant medications:  gabapentin 800mg  TID, lamotrigine 150mg  BID Current anti-CGRP:  Emgality Current Vitamins/Herbal/Supplements:  none Current Antihistamines/Decongestants:  none Other therapy:  *** Hormone/birth control:  none Other medications:  buspirone, Abilify, albuterol  Past NSAIDS/analgesics:  ibuprofen, naproxen, meloxicam, hydrocodone Past abortive triptans:  rizatriptan, sumatriptan NS/[place] Past abortive ergotamine:  none Past muscle relaxants:  Robaxin Past anti-emetic:  promethazine Past antihypertensive medications:  propranolol Past antidepressant medications:  imipramine,  mirtazapine Past anticonvulsant medications:  *** Past anti-CGRP:  Aimovig. [place] Past vitamins/Herbal/Supplements:  *** Past antihistamines/decongestants:  hydroxyzine, Flonase Other past therapies:  ***  She has history of chronic back pain as well as bowel and bladder incontinence.    NCV-EMG in 2019 of upper and lower extremities was normal.  Underwent extensive imaging in February 2022.  MRI of brain without contrast on showed mild chronic hyperintensities in the cerebral white matter.  MRI of cervical spine showed mild spondylosis with mild disc bulging but no evidence of canal or foraminal stenosis or spinal cord abnormality.  MRI of thoracic spine unremarkable.  MRI lumbar spine was unremarkable.  CT abdomen and pelvis with contrast showed only simple cyst in the right kidney.    Caffeine:  *** Alcohol:  *** Smoker:  *** Diet:  *** Exercise:  *** Depression/Anxiety.  Yes.  Bipolar affective disorder. Sleep hygiene:  OSA.  *** Family history of headache:  ***      PAST MEDICAL HISTORY: Past Medical History:  Diagnosis Date   Allergy    Anxiety    Bipolar affective psychosis ([place])    Complication of anesthesia    " Acting silly- waving at everyone"-Giddy   Depression    Emphysema lung ([facility])    inhaler prn,  followed by pcp and pulmonologist-- dr Margorie John   Family history of adverse reaction to anesthesia    Father had rheumatic fever, and died on operating table.   GERD (gastroesophageal reflux disease)    Kidney cysts 01/24/2020   Right Kidney   Migraine    Smokers' cough ([place])    per pt not productive   SUI (stress urinary incontinence, female)    Tubular adenoma of colon 07/08/2020   Wears glasses     PAST SURGICAL HISTORY: Past Surgical History:  Procedure Laterality Date   ABDOMINAL HYSTERECTOMY N/A    Phreesia  11/30/2019   CARPAL TUNNEL RELEASE Right 09-12-2007   @[facility]    and GANGLION CYST EXCISION   COLONOSCOPY     CYSTOSCOPY N/A 04/29/2019    Procedure: CYSTOSCOPY;  Surgeon: Joseph Pierini, MD;  Location[facility];  Service: Gynecology;  Laterality: N/A;   DILATATION & CURETTAGE/HYSTEROSCOPY WITH MYOSURE N/A 11/13/2018   Procedure: DILATATION & CURETTAGE/HYSTEROSCOPY WITH MYOSURE;  Surgeon: Anastasio Auerbach, MD;  Location: [place];  Service: Gynecology;  Laterality: N/A;  request 9:00am OR start time in [place] block requests one hour   [place]  02/2019   EYE SURGERY N/A    Phreesia 11/30/2019   GANGLION CYST EXCISION     R wrist   KNEE ARTHROSCOPY WITH ANTERIOR CRUCIATE LIGAMENT (ACL) REPAIR Right 10/2015   LAPAROSCOPIC CHOLECYSTECTOMY  1997   LAPAROSCOPIC HYSTERECTOMY Bilateral 04/29/2019   Procedure: HYSTERECTOMY TOTAL LAPAROSCOPIC, BILATERAL SALPINO-OOPHORECTOMY;  Surgeon: Joseph Pierini, MD;  Location: [place] [facility];  Service: Gynecology;  Laterality: Bilateral;   TOOTH EXTRACTION  04/01/2019   UPPER GASTROINTESTINAL ENDOSCOPY      MEDICATIONS: Current Outpatient Medications on File Prior to Visit  Medication Sig Dispense Refill   albuterol (VENTOLIN HFA) 108 (90 Base) MCG/ACT inhaler Inhale 2 puffs into the lungs every 4 (four) hours as needed for wheezing or shortness of breath. 1 each 1   ARIPiprazole (ABILIFY) 20 MG tablet Take 20 mg by mouth at bedtime.     aspirin-acetaminophen-caffeine (EXCEDRIN MIGRAINE) 250-250-65 MG tablet Take 2 tablets by mouth every 6 (six) hours as needed for headache.     busPIRone (BUSPAR) 15 MG tablet Take 15 mg by mouth 3 (three) times daily.     citalopram (CELEXA) 20 MG tablet Take 20 mg by mouth daily.     esomeprazole (NEXIUM) 20 MG capsule Take 1 capsule (20 mg total) by mouth 2 (two) times daily before a meal. (Patient taking differently: Take 20 mg by mouth 2 (two) times daily as needed (indigestion).) 60 capsule 1   gabapentin (NEURONTIN) 800 MG tablet Take 800 mg by mouth 3 (three) times  daily.      Galcanezumab-gnlm (EMGALITY) 120 MG/ML SOAJ Inject 120 mg into the skin every 30 (thirty) days. 3 mL 3   lamoTRIgine (LAMICTAL) 150 MG tablet Take 150 mg by mouth 2 (two) times daily.      ondansetron (ZOFRAN) 4 MG tablet Take 1 tablet (4 mg total) by mouth every 8 (eight) hours as needed for nausea or vomiting. 20 tablet 6   SUMAtriptan (IMITREX) 25 MG tablet Take 1 tablet (25 mg total) by mouth every 2 (two) hours as needed for migraine. May repeat in 2 hours if headache persists or recurs. 10 tablet 6   tiZANidine (ZANAFLEX) 4 MG tablet Take 1 tablet (4 mg total) by mouth every 6 (six) hours as needed for muscle spasms. TAKE 1 TABLET BY MOUTH EVERY 6 HOURS AS NEEDED FOR MUSCLE SPASMS. 30 tablet 6   Vibegron (GEMTESA) 75 MG TABS Take by mouth.     No current facility-administered medications on file prior to visit.    ALLERGIES: Allergies  Allergen Reactions   Codeine Anaphylaxis    Anything with codeine    FAMILY HISTORY: Family History  Problem Relation Age of Onset   Depression Mother    Cancer Mother        Cervical   Alcohol abuse Father    Crohn's disease Sister    Stroke Brother 20  Migraines Daughter    GER disease Daughter    Depression Daughter    Migraines Daughter    GER disease Daughter    Colon cancer Neg Hx    Rectal cancer Neg Hx    Stomach cancer Neg Hx     Objective:  *** General: No acute distress.  Patient appears well-groomed.   Head:  Normocephalic/atraumatic Eyes:  fundi examined but not visualized Neck: supple, no paraspinal tenderness, full range of motion Back: No paraspinal tenderness Heart: regular rate and rhythm Lungs: Clear to auscultation bilaterally. Vascular: No carotid bruits. Neurological Exam: Mental status: alert and oriented to person, place, and time, recent and remote memory intact, fund of knowledge intact, attention and concentration intact, speech fluent and not dysarthric, language intact. Cranial nerves: CN  I: not tested CN II: pupils equal, round and reactive to light, visual fields intact CN III, IV, VI:  full range of motion, no nystagmus, no ptosis CN V: facial sensation intact. CN VII: upper and lower face symmetric CN VIII: hearing intact CN IX, X: gag intact, uvula midline CN XI: sternocleidomastoid and trapezius muscles intact CN XII: tongue midline Bulk & Tone: normal, no fasciculations. Motor:  muscle strength 5/5 throughout Sensation:  Pinprick, temperature and vibratory sensation intact. Deep Tendon Reflexes:  2+ throughout,  toes downgoing.   Finger to nose testing:  Without dysmetria.   Heel to shin:  Without dysmetria.   Gait:  Normal station and stride.  Romberg negative.    Thank you for allowing me to take part in the care of this patient.  Metta Clines, DO  CC: ***

## 2020-12-21 ENCOUNTER — Ambulatory Visit: Payer: Medicare Other | Admitting: Neurology

## 2020-12-25 NOTE — Progress Notes (Signed)
Chart reviewed, agree above plan ?

## 2020-12-28 ENCOUNTER — Telehealth: Payer: Medicare Other | Admitting: Physician Assistant

## 2020-12-28 DIAGNOSIS — U071 COVID-19: Secondary | ICD-10-CM

## 2020-12-28 MED ORDER — MOLNUPIRAVIR EUA 200MG CAPSULE
4.0000 | ORAL_CAPSULE | Freq: Two times a day (BID) | ORAL | 0 refills | Status: AC
Start: 2020-12-28 — End: 2021-01-02

## 2020-12-28 MED ORDER — BENZONATATE 100 MG PO CAPS: 100.0000 mg | ORAL_CAPSULE | Freq: Three times a day (TID) | ORAL | 0 refills | Status: DC | PRN

## 2020-12-28 NOTE — Patient Instructions (Signed)
Lauren Chapman, thank you for joining Leeanne Rio, PA-C for today's virtual visit.  While this provider is not your primary care provider (PCP), if your PCP is located in our provider database this encounter information will be shared with them immediately following your visit.  Consent: (Patient) Lauren Chapman provided verbal consent for this virtual visit at the beginning of the encounter.  Current Medications:  Current Outpatient Medications:    albuterol (VENTOLIN HFA) 108 (90 Base) MCG/ACT inhaler, Inhale 2 puffs into the lungs every 4 (four) hours as needed for wheezing or shortness of breath., Disp: 1 each, Rfl: 1   ARIPiprazole (ABILIFY) 20 MG tablet, Take 20 mg by mouth at bedtime., Disp: , Rfl:    aspirin-acetaminophen-caffeine (EXCEDRIN MIGRAINE) 250-250-65 MG tablet, Take 2 tablets by mouth every 6 (six) hours as needed for headache., Disp: , Rfl:    busPIRone (BUSPAR) 15 MG tablet, Take 15 mg by mouth 3 (three) times daily., Disp: , Rfl:    citalopram (CELEXA) 20 MG tablet, Take 20 mg by mouth daily., Disp: , Rfl:    esomeprazole (NEXIUM) 20 MG capsule, Take 1 capsule (20 mg total) by mouth 2 (two) times daily before a meal. (Patient taking differently: Take 20 mg by mouth 2 (two) times daily as needed (indigestion).), Disp: 60 capsule, Rfl: 1   gabapentin (NEURONTIN) 800 MG tablet, Take 800 mg by mouth 3 (three) times daily. , Disp: , Rfl:    Galcanezumab-gnlm (EMGALITY) 120 MG/ML SOAJ, Inject 120 mg into the skin every 30 (thirty) days., Disp: 3 mL, Rfl: 3   lamoTRIgine (LAMICTAL) 150 MG tablet, Take 150 mg by mouth 2 (two) times daily. , Disp: , Rfl:    ondansetron (ZOFRAN) 4 MG tablet, Take 1 tablet (4 mg total) by mouth every 8 (eight) hours as needed for nausea or vomiting., Disp: 20 tablet, Rfl: 6   SUMAtriptan (IMITREX) 25 MG tablet, Take 1 tablet (25 mg total) by mouth every 2 (two) hours as needed for migraine. May repeat in 2 hours if headache persists or  recurs., Disp: 10 tablet, Rfl: 6   tiZANidine (ZANAFLEX) 4 MG tablet, Take 1 tablet (4 mg total) by mouth every 6 (six) hours as needed for muscle spasms. TAKE 1 TABLET BY MOUTH EVERY 6 HOURS AS NEEDED FOR MUSCLE SPASMS., Disp: 30 tablet, Rfl: 6   Vibegron (GEMTESA) 75 MG TABS, Take by mouth., Disp: , Rfl:    Medications ordered in this encounter:  No orders of the defined types were placed in this encounter.    *If you need refills on other medications prior to your next appointment, please contact your pharmacy*  Follow-Up: Call back or seek an in-person evaluation if the symptoms worsen or if the condition fails to improve as anticipated.  Other Instructions Please keep well-hydrated and get plenty of rest. Start a saline nasal rinse to flush out your nasal passages. You can use plain Mucinex to help thin congestion. If you have a humidifier, running in the bedroom at night. I want you to start OTC vitamin D3 1000 units daily, vitamin C 1000 mg daily, and a zinc supplement. Please take prescribed medications as directed.  You have been enrolled in a MyChart symptom monitoring program. Please answer these questions daily so we can keep track of how you are doing.  You were to quarantine for 5 days from onset of your symptoms.  After day 5, if you have had no fever and you are feeling better,  you can end quarantine but need to mask for an additional 5 days. After day 5 if you have a fever or are having significant symptoms, please quarantine for full 10 days.  If you note any worsening of symptoms, any significant shortness of breath or any chest pain, please seek ER evaluation ASAP.  Please do not delay care!  COVID-19: What to Do if You Are Sick If you test positive and are an older adult or someone who is at high risk of getting very sick from COVID-19, treatment may be available. Contact a healthcare provider right away after a positive test to determine if you are eligible, even  if your symptoms are mild right now. You can also visit a Test to Treat location and, if eligible, receive a prescription from a provider. Don't delay: Treatment must be started within the first few days to be effective. If you have a fever, cough, or other symptoms, you might have COVID-19. Most people have mild illness and are able to recover at home. If you are sick: Keep track of your symptoms. If you have an emergency warning sign (including trouble breathing), call 911. Steps to help prevent the spread of COVID-19 if you are sick If you are sick with COVID-19 or think you might have COVID-19, follow the steps below to care for yourself and to help protect other people in your home and community. Stay home except to get medical care Stay home. Most people with COVID-19 have mild illness and can recover at home without medical care. Do not leave your home, except to get medical care. Do not visit public areas and do not go to places where you are unable to wear a mask. Take care of yourself. Get rest and stay hydrated. Take over-the-counter medicines, such as acetaminophen, to help you feel better. Stay in touch with your doctor. Call before you get medical care. Be sure to get care if you have trouble breathing, or have any other emergency warning signs, or if you think it is an emergency. Avoid public transportation, ride-sharing, or taxis if possible. Get tested If you have symptoms of COVID-19, get tested. While waiting for test results, stay away from others, including staying apart from those living in your household. Get tested as soon as possible after your symptoms start. Treatments may be available for people with COVID-19 who are at risk for becoming very sick. Don't delay: Treatment must be started early to be effective--some treatments must begin within 5 days of your first symptoms. Contact your healthcare provider right away if your test result is positive to determine if you are  eligible. Self-tests are one of several options for testing for the virus that causes COVID-19 and may be more convenient than laboratory-based tests and point-of-care tests. Ask your healthcare provider or your local health department if you need help interpreting your test results. You can visit your state, tribal, local, and territorial health department's website to look for the latest local information on testing sites. Separate yourself from other people As much as possible, stay in a specific room and away from other people and pets in your home. If possible, you should use a separate bathroom. If you need to be around other people or animals in or outside of the home, wear a well-fitting mask. Tell your close contacts that they may have been exposed to COVID-19. An infected person can spread COVID-19 starting 48 hours (or 2 days) before the person has any symptoms or tests  positive. By letting your close contacts know they may have been exposed to COVID-19, you are helping to protect everyone. See COVID-19 and Animals if you have questions about pets. If you are diagnosed with COVID-19, someone from the health department may call you. Answer the call to slow the spread. Monitor your symptoms Symptoms of COVID-19 include fever, cough, or other symptoms. Follow care instructions from your healthcare provider and local health department. Your local health authorities may give instructions on checking your symptoms and reporting information. When to seek emergency medical attention Look for emergency warning signs* for COVID-19. If someone is showing any of these signs, seek emergency medical care immediately: Trouble breathing Persistent pain or pressure in the chest New confusion Inability to wake or stay awake Pale, gray, or blue-colored skin, lips, or nail beds, depending on skin tone *This list is not all possible symptoms. Please call your medical provider for any other symptoms that are  severe or concerning to you. Call 911 or call ahead to your local emergency facility: Notify the operator that you are seeking care for someone who has or may have COVID-19. Call ahead before visiting your doctor Call ahead. Many medical visits for routine care are being postponed or done by phone or telemedicine. If you have a medical appointment that cannot be postponed, call your doctor's office, and tell them you have or may have COVID-19. This will help the office protect themselves and other patients. If you are sick, wear a well-fitting mask You should wear a mask if you must be around other people or animals, including pets (even at home). Wear a mask with the best fit, protection, and comfort for you. You don't need to wear the mask if you are alone. If you can't put on a mask (because of trouble breathing, for example), cover your coughs and sneezes in some other way. Try to stay at least 6 feet away from other people. This will help protect the people around you. Masks should not be placed on young children under age 58 years, anyone who has trouble breathing, or anyone who is not able to remove the mask without help. Cover your coughs and sneezes Cover your mouth and nose with a tissue when you cough or sneeze. Throw away used tissues in a lined trash can. Immediately wash your hands with soap and water for at least 20 seconds. If soap and water are not available, clean your hands with an alcohol-based hand sanitizer that contains at least 60% alcohol. Clean your hands often Wash your hands often with soap and water for at least 20 seconds. This is especially important after blowing your nose, coughing, or sneezing; going to the bathroom; and before eating or preparing food. Use hand sanitizer if soap and water are not available. Use an alcohol-based hand sanitizer with at least 60% alcohol, covering all surfaces of your hands and rubbing them together until they feel dry. Soap and water  are the best option, especially if hands are visibly dirty. Avoid touching your eyes, nose, and mouth with unwashed hands. Handwashing Tips Avoid sharing personal household items Do not share dishes, drinking glasses, cups, eating utensils, towels, or bedding with other people in your home. Wash these items thoroughly after using them with soap and water or put in the dishwasher. Clean surfaces in your home regularly Clean and disinfect high-touch surfaces (for example, doorknobs, tables, handles, light switches, and countertops) in your "sick room" and bathroom. In shared spaces, you should clean  and disinfect surfaces and items after each use by the person who is ill. If you are sick and cannot clean, a caregiver or other person should only clean and disinfect the area around you (such as your bedroom and bathroom) on an as needed basis. Your caregiver/other person should wait as long as possible (at least several hours) and wear a mask before entering, cleaning, and disinfecting shared spaces that you use. Clean and disinfect areas that may have blood, stool, or body fluids on them. Use household cleaners and disinfectants. Clean visible dirty surfaces with household cleaners containing soap or detergent. Then, use a household disinfectant. Use a product from [employer] List N: Disinfectants for Coronavirus (MVHQI-69). Be sure to follow the instructions on the label to ensure safe and effective use of the product. Many products recommend keeping the surface wet with a disinfectant for a certain period of time (look at "contact time" on the product label). You may also need to wear personal protective equipment, such as gloves, depending on the directions on the product label. Immediately after disinfecting, wash your hands with soap and water for 20 seconds. For completed guidance on cleaning and disinfecting your home, visit Complete Disinfection Guidance. Take steps to improve ventilation at  home Improve ventilation (air flow) at home to help prevent from spreading COVID-19 to other people in your household. Clear out COVID-19 virus particles in the air by opening windows, using air filters, and turning on fans in your home. Use this interactive tool to learn how to improve air flow in your home. When you can be around others after being sick with COVID-19 Deciding when you can be around others is different for different situations. Find out when you can safely end home isolation. For any additional questions about your care, contact your healthcare provider or state or local health department. 04/13/2020 Content source: [facility] ([facility]), Division of Viral Diseases This information is not intended to replace advice given to you by your health care provider. Make sure you discuss any questions you have with your health care provider. Document Revised: 05/27/2020 Document Reviewed: 05/27/2020 Elsevier Patient Education  2022 [employer].      If you have been instructed to have an in-person evaluation today at a local Urgent Care facility, please use the link below. It will take you to a list of all of our available [facility], including address, phone number and hours of operation. Please do not delay care.  [facility]  If you or a family member do not have a primary care provider, use the link below to schedule a visit and establish care. When you choose a [facility] primary care physician or advanced practice provider, you gain a long-term partner in health. Find a Primary Care Provider  Learn more about [facility] in-office and virtual care options: [facility]

## 2020-12-28 NOTE — Progress Notes (Signed)
Erroneous -- patient had video visit.

## 2020-12-28 NOTE — Progress Notes (Signed)
Virtual Visit Consent   Lauren Chapman, you are scheduled for a virtual visit with a [facility] provider today.     Just as with appointments in the office, your consent must be obtained to participate.  Your consent will be active for this visit and any virtual visit you may have with one of our providers in the next 365 days.     If you have a MyChart account, a copy of this consent can be sent to you electronically.  All virtual visits are billed to your insurance company just like a traditional visit in the office.    As this is a virtual visit, video technology does not allow for your provider to perform a traditional examination.  This may limit your provider's ability to fully assess your condition.  If your provider identifies any concerns that need to be evaluated in person or the need to arrange testing (such as labs, EKG, etc.), we will make arrangements to do so.     Although advances in technology are sophisticated, we cannot ensure that it will always work on either your end or our end.  If the connection with a video visit is poor, the visit may have to be switched to a telephone visit.  With either a video or telephone visit, we are not always able to ensure that we have a secure connection.     I need to obtain your verbal consent now.   Are you willing to proceed with your visit today?    Lauren Chapman has provided verbal consent on 12/28/2020 for a virtual visit (video or telephone).   Leeanne Rio, [place]   Date: 12/28/2020 4:21 PM   Virtual Visit via Video Note   I, Leeanne Rio, connected with  Lauren Chapman  ([id number], 1970/07/22) on 12/28/20 at  4:00 PM EST by a video-enabled telemedicine application and verified that I am speaking with the correct person using two identifiers.  Location: Patient: Virtual Visit Location Patient: Home Provider: Virtual Visit Location Provider: Home Office   I discussed the limitations of evaluation and management  by telemedicine and the availability of in person appointments. The patient expressed understanding and agreed to proceed.    History of Present Illness: Lauren Chapman is a 50 y.o. who identifies as a female who was assigned female at birth, and is being seen today for COVID-19. Notes testing positive today with symptom onset yesterday. Notes head congestion, nasal congestion making hard to breathe through nose, some chest congestion and dry cough. Denies chest pain or any true SOB. Denies fever, chills or aches. Feels much worse today compared to yesterday.    HPI: HPI  Problems:  Patient Active Problem List   Diagnosis Date Noted   Depression 03/24/2020   Bilateral low back pain with left-sided sciatica 03/24/2020   Left lumbar radiculopathy 02/25/2020   Absence of bladder continence 02/25/2020   History of postmenopausal bleeding 04/29/2019   Polypharmacy 04/03/2019   Emphysema, unspecified ([place]) 05/17/2018   Weakness 01/25/2017   Chronic migraine w/o aura w/o status migrainosus, not intractable 12/22/2014   Encounter for other general counseling or advice on contraception 09/02/2014   Bipolar disorder ([place]) 10/12/2011   Anxiety and depression 10/12/2011   GERD (gastroesophageal reflux disease) 10/12/2011    Allergies:  Allergies  Allergen Reactions   Codeine Anaphylaxis    Anything with codeine   Medications:  Current Outpatient Medications:    benzonatate (TESSALON) 100 MG capsule, Take  1 capsule (100 mg total) by mouth 3 (three) times daily as needed for cough., Disp: 30 capsule, Rfl: 0   molnupiravir EUA (LAGEVRIO) 200 mg CAPS capsule, Take 4 capsules (800 mg total) by mouth 2 (two) times daily for 5 days., Disp: 40 capsule, Rfl: 0   albuterol (VENTOLIN HFA) 108 (90 Base) MCG/ACT inhaler, Inhale 2 puffs into the lungs every 4 (four) hours as needed for wheezing or shortness of breath., Disp: 1 each, Rfl: 1   ARIPiprazole (ABILIFY) 30 MG tablet, Take 30 mg by mouth at  bedtime., Disp: , Rfl:    aspirin-acetaminophen-caffeine (EXCEDRIN MIGRAINE) 250-250-65 MG tablet, Take 2 tablets by mouth every 6 (six) hours as needed for headache., Disp: , Rfl:    benztropine (COGENTIN) 0.5 MG tablet, Take 0.5 mg by mouth 2 (two) times daily., Disp: , Rfl:    busPIRone (BUSPAR) 15 MG tablet, Take 15 mg by mouth 3 (three) times daily., Disp: , Rfl:    citalopram (CELEXA) 20 MG tablet, Take 20 mg by mouth daily., Disp: , Rfl:    esomeprazole (NEXIUM) 20 MG capsule, Take 1 capsule (20 mg total) by mouth 2 (two) times daily before a meal. (Patient taking differently: Take 20 mg by mouth 2 (two) times daily as needed (indigestion).), Disp: 60 capsule, Rfl: 1   gabapentin (NEURONTIN) 800 MG tablet, Take 800 mg by mouth 3 (three) times daily. , Disp: , Rfl:    Galcanezumab-gnlm (EMGALITY) 120 MG/ML SOAJ, Inject 120 mg into the skin every 30 (thirty) days., Disp: 3 mL, Rfl: 3   lamoTRIgine (LAMICTAL) 150 MG tablet, Take 150 mg by mouth 2 (two) times daily. , Disp: , Rfl:    ondansetron (ZOFRAN) 4 MG tablet, Take 1 tablet (4 mg total) by mouth every 8 (eight) hours as needed for nausea or vomiting., Disp: 20 tablet, Rfl: 6   SUMAtriptan (IMITREX) 25 MG tablet, Take 1 tablet (25 mg total) by mouth every 2 (two) hours as needed for migraine. May repeat in 2 hours if headache persists or recurs., Disp: 10 tablet, Rfl: 6   tiZANidine (ZANAFLEX) 4 MG tablet, Take 1 tablet (4 mg total) by mouth every 6 (six) hours as needed for muscle spasms. TAKE 1 TABLET BY MOUTH EVERY 6 HOURS AS NEEDED FOR MUSCLE SPASMS., Disp: 30 tablet, Rfl: 6  Observations/Objective: Patient is well-developed, well-nourished in no acute distress.  Resting comfortably at home.  Head is normocephalic, atraumatic.  No labored breathing. Speech is clear and coherent with logical content.  Patient is alert and oriented at baseline.   Assessment and Plan: 1. COVID-19 - MyChart COVID-19 home monitoring program; Future -  benzonatate (TESSALON) 100 MG capsule; Take 1 capsule (100 mg total) by mouth 3 (three) times daily as needed for cough.  Dispense: 30 capsule; Refill: 0 - molnupiravir EUA (LAGEVRIO) 200 mg CAPS capsule; Take 4 capsules (800 mg total) by mouth 2 (two) times daily for 5 days.  Dispense: 40 capsule; Refill: 0 Patient with multiple risk factors for complicated course of illness. Discussed risks/benefits of antiviral medications including most common potential ADRs. Patient voiced understanding and would like to proceed with antiviral medication. They are candidate for molnupiravir. Rx sent to pharmacy. Supportive measures, OTC medications and vitamin regimen reviewed. Tessalon per orders. Patient has been enrolled in a MyChart COVID symptom monitoring program. Samule Dry reviewed in detail. Strict ER precautions discussed with patient.    Follow Up Instructions: I discussed the assessment and treatment plan with the patient. The  patient was provided an opportunity to ask questions and all were answered. The patient agreed with the plan and demonstrated an understanding of the instructions.  A copy of instructions were sent to the patient via MyChart unless otherwise noted below.   The patient was advised to call back or seek an in-person evaluation if the symptoms worsen or if the condition fails to improve as anticipated.  Time:  I spent 15 minutes with the patient via telehealth technology discussing the above problems/concerns.    Leeanne Rio, PA-C

## 2020-12-30 ENCOUNTER — Telehealth: Payer: Self-pay

## 2020-12-30 NOTE — Telephone Encounter (Signed)
Noted.  Glad she is taking the anti-viral medication

## 2020-12-30 NOTE — Telephone Encounter (Signed)
Lvm for patient letting her know we received her message and that Dr Carlota Raspberry is out of the office but I would forward this to him as well as Dr Birdie Riddle so that someone is aware.

## 2020-12-30 NOTE — Telephone Encounter (Signed)
Caller name:Mylene Saiki  On DPR? :Yes  Call back [phone]  Provider they see: Carlota Raspberry  Reason for call:Pt is calling to let Dr Carlota Raspberry know that she tested positive for Covid and she is taking lagevrio

## 2021-01-05 ENCOUNTER — Encounter: Payer: Self-pay | Admitting: Gastroenterology

## 2021-01-05 ENCOUNTER — Ambulatory Visit (INDEPENDENT_AMBULATORY_CARE_PROVIDER_SITE_OTHER): Payer: Medicare Other | Admitting: Gastroenterology

## 2021-01-05 VITALS — BP 102/60 | HR 64 | Ht 63.0 in | Wt 183.5 lb

## 2021-01-05 DIAGNOSIS — K219 Gastro-esophageal reflux disease without esophagitis: Secondary | ICD-10-CM | POA: Diagnosis not present

## 2021-01-05 DIAGNOSIS — Z86010 Personal history of colonic polyps: Secondary | ICD-10-CM

## 2021-01-05 NOTE — Patient Instructions (Signed)
Decrease your Nexium to 40 mg daily. If your symptoms are not under control then you can resume twice daily dosing.   The [facility] providers would like to encourage you to use [facility] to communicate with providers for non-urgent requests or questions.  Due to long hold times on the telephone, sending your provider a message by [facility] may be a faster and more efficient way to get a response.  Please allow 48 business hours for a response.  Please remember that this is for non-urgent requests.    Thank you for choosing me and [facility].  Pricilla Riffle. Dagoberto Ligas., MD., [facility]

## 2021-01-05 NOTE — Progress Notes (Signed)
° ° °  History of Present Illness: This is a 50 year old female returning for follow-up of GERD.  Her reflux symptoms are well controlled on Nexium 20 mg twice daily.  She has persistent problems with fecal incontinence.  She relates that her urologist plans to place a stimulator for urinary and fecal incontinence in January.  EGD 09/2020 - Normal esophagus. - Erythematous mucosa in the stomach. Biopsied. - Normal duodenal bulb and second portion of the duodenum.   Current Medications, Allergies, Past Medical History, Past Surgical History, Family History and Social History were reviewed in [employer] record.   Physical Exam: General: Well developed, well nourished, no acute distress Head: Normocephalic and atraumatic Eyes: Sclerae anicteric, EOMI Ears: Normal auditory acuity Mouth: Not examined, mask on during Covid-19 pandemic Lungs: Clear throughout to auscultation Heart: Regular rate and rhythm; no murmurs, rubs or bruits Abdomen: Soft, non tender and non distended. No masses, hepatosplenomegaly or hernias noted. Normal Bowel sounds Rectal: Not done Musculoskeletal: Symmetrical with no gross deformities  Pulses:  Normal pulses noted Extremities: No clubbing, cyanosis, edema or deformities noted Neurological: Alert oriented x 4, grossly nonfocal Psychological:  Alert and cooperative. Normal mood and affect   Assessment and Recommendations:  GERD.  Continue to follow antireflux measures long-term.  Decrease Nexium to 20 mg p.o. daily.  If symptoms are not well controlled she may resume 20 mg p.o. twice daily. Fecal incontinence, urinary incontinence. Dr. Matilde Sprang, Urology, is placing a stimulator in January.  Personal history of adenomatous and sessile serrated colon polyps.  A 3-year interval surveillance colonoscopy is recommended in June 2025.

## 2021-02-21 LAB — HM MAMMOGRAPHY

## 2021-02-25 ENCOUNTER — Encounter: Payer: Self-pay | Admitting: Family Medicine

## 2021-03-14 ENCOUNTER — Ambulatory Visit (INDEPENDENT_AMBULATORY_CARE_PROVIDER_SITE_OTHER): Payer: Medicare Other | Admitting: Family Medicine

## 2021-03-14 ENCOUNTER — Encounter: Payer: Self-pay | Admitting: Family Medicine

## 2021-03-14 VITALS — BP 126/74 | HR 67 | Temp 98.1°F | Resp 16 | Ht 63.0 in | Wt 184.6 lb

## 2021-03-14 DIAGNOSIS — G43501 Persistent migraine aura without cerebral infarction, not intractable, with status migrainosus: Secondary | ICD-10-CM

## 2021-03-14 MED ORDER — PREDNISONE 10 MG PO TABS: 10.0000 mg | ORAL_TABLET | Freq: Every day | ORAL | 0 refills | Status: DC

## 2021-03-14 NOTE — Progress Notes (Signed)
Subjective:  Patient ID: Lauren Chapman, female    DOB: 1970-10-04  Age: 51 y.o. MRN: [id number]  CC:  Chief Complaint  Patient presents with   Migraine    Pt has had migraine for 4 days now, reports severity and frequency are about the same as last check, reports has been using tylenol but this is not helping significantly    FYI    Pt has procedure tomorrow for innerstem     HPI Lauren Chapman presents for   Migraine headache Chronic migraine with aura,  last discussed in November.  Control had improved with Emgality, and Maxalt as needed, started on CPAP last year, sleeping better.  Migraine 3 times per week at her November visit with Imitrex.  Had been off tizanidine for a few months.  She had taken the tizanidine with Zofran and Imitrex with the migraine that had worked better.  Previous neurologist had released her to follow-up with PCP for migraine treatments.  When discussed in November I asked that she follow-up with headache specialist to evaluate for possible med changes given few episodes of migraines per week on Emgality.  Meds were refilled at that time. Appointment with Dr. Tomi Likens in few days 03/16/2021. Did not think Emgality was working, so stopped on own 1 month ago. Increased headache frequency since stopping. Current HA past 5 days. Taking imitrex every few days, and alternating with excedrin migraine on other days. . Unable to take aspirin due to planned procedure tomorrow.  Has taken tizanidine, zofran, and imitrex last night helped, some return of HA this am.  Typical migraine HA - left side, no new weakness, preceded by typical aura.  Stress at work.   Followed by psychiatry and therapist. Using CPAP.    History of urinary incontinence.  Urologist Dr. Matilde Sprang Planned stim unit placement procedure tomorrow.   History Patient Active Problem List   Diagnosis Date Noted   Depression 03/24/2020   Bilateral low back pain with left-sided sciatica 03/24/2020    Left lumbar radiculopathy 02/25/2020   Absence of bladder continence 02/25/2020   History of postmenopausal bleeding 04/29/2019   Polypharmacy 04/03/2019   Emphysema, unspecified ([place]) 05/17/2018   Weakness 01/25/2017   Chronic migraine w/o aura w/o status migrainosus, not intractable 12/22/2014   Encounter for other general counseling or advice on contraception 09/02/2014   Bipolar disorder ([place]) 10/12/2011   Anxiety and depression 10/12/2011   GERD (gastroesophageal reflux disease) 10/12/2011   Past Medical History:  Diagnosis Date   Allergy    Anxiety    Bipolar affective psychosis ([place])    Complication of anesthesia    " Acting silly- waving at everyone"-Giddy   Depression    Emphysema lung ([place])    inhaler prn,  followed by pcp and pulmonologist-- dr Margorie John   Family history of adverse reaction to anesthesia    Father had rheumatic fever, and died on operating table.   GERD (gastroesophageal reflux disease)    Kidney cysts 01/24/2020   Right Kidney   Migraine    Smokers' cough ([place])    per pt not productive   SUI (stress urinary incontinence, female)    Tubular adenoma of colon 07/08/2020   Wears glasses    Past Surgical History:  Procedure Laterality Date   ABDOMINAL HYSTERECTOMY N/A    [employer]rees[employer] 11/30/2019   CARPAL TUNNEL RELEASE Right 09-12-2007   @[facility]    and GANGLION CYST EXCISION   COLONOSCOPY     CYSTOSCOPY N/A 04/29/2019  Procedure: CYSTOSCOPY;  Surgeon: Joseph Pierini, MD;  Location: [facility];  Service: Gynecology;  Laterality: N/A;   DILATATION & CURETTAGE/HYSTEROSCOPY WITH MYOSURE N/A 11/13/2018   Procedure: DILATATION & CURETTAGE/HYSTEROSCOPY WITH MYOSURE;  Surgeon: Anastasio Auerbach, MD;  Location: [facility];  Service: Gynecology;  Laterality: N/A;  request 9:00am OR start time in [place] block requests one hour   [place]  02/2019   EYE SURGERY N/A    [employer]reesia 11/30/2019    GANGLION CYST EXCISION     R wrist   KNEE ARTHROSCOPY WITH ANTERIOR CRUCIATE LIGAMENT (ACL) REPAIR Right 10/2015   LAPAROSCOPIC CHOLECYSTECTOMY  1997   LAPAROSCOPIC HYSTERECTOMY Bilateral 04/29/2019   Procedure: HYSTERECTOMY TOTAL LAPAROSCOPIC, BILATERAL SALPINO-OOPHORECTOMY;  Surgeon: Joseph Pierini, MD;  Location: [facility];  Service: Gynecology;  Laterality: Bilateral;   TOOTH EXTRACTION  04/01/2019   tooth removal Bilateral 2022   4 teeth removed   UPPER GASTROINTESTINAL ENDOSCOPY     Allergies  Allergen Reactions   Codeine Anaphylaxis    Anything with codeine   Prior to Admission medications   Medication Sig Start Date End Date Taking? Authorizing Provider  albuterol (VENTOLIN HFA) 108 (90 Base) MCG/ACT inhaler Inhale 2 puffs into the lungs every 4 (four) hours as needed for wheezing or shortness of breath. 02/20/20  Yes Olalere, Adewale A, MD  ARIPiprazole (ABILIFY) 30 MG tablet Take 30 mg by mouth at bedtime. 12/23/20  Yes [provider]  aspirin-acetaminophen-caffeine (EXCEDRIN MIGRAINE) [phone] MG tablet Take 2 tablets by mouth every 6 (six) hours as needed for headache.   Yes [provider]  benzonatate (TESSALON) 100 MG capsule Take 1 capsule (100 mg total) by mouth 3 (three) times daily as needed for cough. 12/28/20  Yes Brunetta Jeans, PA-C  benztropine (COGENTIN) 0.5 MG tablet Take 0.5 mg by mouth 2 (two) times daily. 12/23/20  Yes [provider]  busPIRone (BUSPAR) 15 MG tablet Take 15 mg by mouth 3 (three) times daily. 07/30/20  Yes [provider]  citalopram (CELEXA) 20 MG tablet Take 20 mg by mouth daily.   Yes [provider]  esomeprazole (NEXIUM) 20 MG capsule Take 1 capsule (20 mg total) by mouth 2 (two) times daily before a meal. Patient taking differently: Take 20 mg by mouth 2 (two) times daily as needed (indigestion). 03/21/19  Yes Jacelyn Pi, Lilia Argue, MD  gabapentin (NEURONTIN) 800 MG tablet  Take 800 mg by mouth 3 (three) times daily.  07/02/18  Yes [provider]  lamoTRIgine (LAMICTAL) 150 MG tablet Take 150 mg by mouth 2 (two) times daily.    Yes [provider]  ondansetron (ZOFRAN) 4 MG tablet Take 1 tablet (4 mg total) by mouth every 8 (eight) hours as needed for nausea or vomiting. 01/05/20  Yes Suzzanne Cloud, NP  SUMAtriptan (IMITREX) 25 MG tablet Take 1 tablet (25 mg total) by mouth every 2 (two) hours as needed for migraine. May repeat in 2 hours if headache persists or recurs. 12/10/20  Yes Wendie Agreste, MD  tiZANidine (ZANAFLEX) 4 MG tablet Take 1 tablet (4 mg total) by mouth every 6 (six) hours as needed for muscle spasms. TAKE 1 TABLET BY MOUTH EVERY 6 HOURS AS NEEDED FOR MUSCLE SPASMS. 12/10/20  Yes Wendie Agreste, MD  Galcanezumab-gnlm [facility]) 120 MG/ML SOAJ Inject 120 mg into the skin every 30 (thirty) days. Patient not taking: Reported on 03/14/2021 03/24/20   Krista Blue,  Aliene Beams, MD   Social History   Socioeconomic History   Marital status: Divorced    Spouse name: n/a   Number of children: 2   Years of education: 18   Highest education level: Not on file  Occupational History   Occupation: Disabled    Comment: Formerly a Pharmacist, hospital.  Tobacco Use   Smoking status: Every Day    Packs/day: 0.50    Years: 33.00    Pack years: 16.50    Types: Cigarettes    Start date: 10/10/1981    Last attempt to quit: 11/10/2019    Years since quitting: 1.3   Smokeless tobacco: Never  Vaping Use   Vaping Use: Never used  Substance and Sexual Activity   Alcohol use: Never    Alcohol/week: 0.0 standard drinks   Drug use: Never   Sexual activity: Not Currently    Partners: Male    Birth control/protection: Post-menopausal    Comment: 1st intercourse 51 yo-More than 5 partners  Other Topics Concern   Not on file  Social History Narrative   Lives at home with 1 of her two daughters   Right-handed.   2-4 cups caffeine daily.   Disabled    Social  Determinants of Health   Financial Resource Strain: Not on file  Food Insecurity: Not on file  Transportation Needs: Not on file  Physical Activity: Not on file  Stress: Not on file  Social Connections: Not on file  Intimate Partner Violence: Not on file    Review of Systems   Objective:   Vitals:   03/14/21 1458  BP: 126/74  Pulse: 67  Resp: 16  Temp: 98.1 F (36.7 C)  TempSrc: Temporal  SpO2: 95%  Weight: 184 lb 9.6 oz (83.7 kg)  Height: 5\' 3"  (1.6 m)     Physical Exam Vitals reviewed.  Constitutional:      Appearance: Normal appearance. She is well-developed.  HENT:     Head: Normocephalic and atraumatic.  Eyes:     Conjunctiva/sclera: Conjunctivae normal.     Pupils: Pupils are equal, round, and reactive to light.  Neck:     Vascular: No carotid bruit.  Cardiovascular:     Rate and Rhythm: Normal rate and regular rhythm.     Heart sounds: Normal heart sounds.  Pulmonary:     Effort: Pulmonary effort is normal.     Breath sounds: Normal breath sounds.  Abdominal:     Palpations: Abdomen is soft. There is no pulsatile mass.     Tenderness: There is no abdominal tenderness.  Musculoskeletal:     Right lower leg: No edema.     Left lower leg: No edema.  Skin:    General: Skin is warm and dry.  Neurological:     General: No focal deficit present.     Mental Status: She is alert and oriented to person, place, and time.     Comments: Nontoxic appearing, appropriate responses, coherent responses.  No focal weakness.  No pronator drift.  Normal finger-to-nose.  Equal strength in upper and lower extremities.  Psychiatric:        Mood and Affect: Mood normal.        Behavior: Behavior normal.     Assessment & Plan:  DAHIANA KULAK is a 51 y.o. female . Migraine aura, persistent, with status migrainosus - Plan: predniSONE (DELTASONE) 10 MG tablet Underlying migraine headache disorder with multiple episodes per week.  Unfortunately stopped Emgality which  may have increased  her headaches this past month.  Will have follow-up with headache specialist/neuro in 2 days.  Can discuss ongoing regimen at that time.  Current migraine present past 4 to 5 days, intermittent severity but appears to be possible status migrainosus based on timing (although does not appear to have current debilitating symptoms).  Typical migraine headache.  Due to upcoming urologic procedure call was placed to on-call urologist and recommended 10 mg prednisone only, deferred dexamethasone, Depo-Medrol or prednisone taper at this time.  Could consider after surgery if persistent headache.  Okay to take tizanidine, Zofran, Imitrex at home after office visit as well.  ER precautions discussed.  Meds ordered this encounter  Medications   predniSONE (DELTASONE) 10 MG tablet    Sig: Take 1 tablet (10 mg total) by mouth daily with breakfast.    Dispense:  1 tablet    Refill:  0   Patient Instructions  You likely will need to restart Emgality, but can be discussed with neurology at appointment in 2 days.  For current headache, can take prednisone once tonight - low dose due to procedure tomorrow. Ok to take tizanidine, zofran and imitrex tonight.  If not improving then may need to be seen in ER for migraine treatment.  Good luck tomorrow.   Migraine Headache A migraine headache is an intense, throbbing pain on one side or both sides of the head. Migraine headaches may also cause other symptoms, such as nausea, vomiting, and sensitivity to light and noise. A migraine headache can last from 4 hours to 3 days. Talk with your doctor about what things may bring on (trigger) your migraine headaches. What are the causes? The exact cause of this condition is not known. However, a migraine may be caused when nerves in the brain become irritated and release chemicals that cause inflammation of blood vessels. This inflammation causes pain. This condition may be triggered or caused by: Drinking  alcohol. Smoking. Taking medicines, such as: Medicine used to treat chest pain (nitroglycerin). Birth control pills. Estrogen. Certain blood pressure medicines. Eating or drinking products that contain nitrates, glutamate, aspartame, or tyramine. Aged cheeses, chocolate, or caffeine may also be triggers. Doing physical activity. Other things that may trigger a migraine headache include: Menstruation. Pregnancy. Hunger. Stress. Lack of sleep or too much sleep. Weather changes. Fatigue. What increases the risk? The following factors may make you more likely to experience migraine headaches: Being a certain age. This condition is more common in people who are 61-3 years old. Being female. Having a family history of migraine headaches. Being Caucasian. Having a mental health condition, such as depression or anxiety. Being obese. What are the signs or symptoms? The main symptom of this condition is pulsating or throbbing pain. This pain may: Happen in any area of the head, such as on one side or both sides. Interfere with daily activities. Get worse with physical activity. Get worse with exposure to bright lights or loud noises. Other symptoms may include: Nausea. Vomiting. Dizziness. General sensitivity to bright lights, loud noises, or smells. Before you get a migraine headache, you may get warning signs (an aura). An aura may include: Seeing flashing lights or having blind spots. Seeing bright spots, halos, or zigzag lines. Having tunnel vision or blurred vision. Having numbness or a tingling feeling. Having trouble talking. Having muscle weakness. Some people have symptoms after a migraine headache (postdromal phase), such as: Feeling tired. Difficulty concentrating. How is this diagnosed? A migraine headache can be diagnosed based on: Your  symptoms. A physical exam. Tests, such as: CT scan or an MRI of the head. These imaging tests can help rule out other causes of  headaches. Taking fluid from the spine (lumbar puncture) and analyzing it (cerebrospinal fluid analysis, or CSF analysis). How is this treated? This condition may be treated with medicines that: Relieve pain. Relieve nausea. Prevent migraine headaches. Treatment for this condition may also include: Acupuncture. Lifestyle changes like avoiding foods that trigger migraine headaches. Biofeedback. Cognitive behavioral therapy. Follow these instructions at home: Medicines Take over-the-counter and prescription medicines only as told by your health care provider. Ask your health care provider if the medicine prescribed to you: Requires you to avoid driving or using heavy machinery. Can cause constipation. You may need to take these actions to prevent or treat constipation: Drink enough fluid to keep your urine pale yellow. Take over-the-counter or prescription medicines. Eat foods that are high in fiber, such as beans, whole grains, and fresh fruits and vegetables. Limit foods that are high in fat and processed sugars, such as fried or sweet foods. Lifestyle Do not drink alcohol. Do not use any products that contain nicotine or tobacco, such as cigarettes, e-cigarettes, and chewing tobacco. If you need help quitting, ask your health care provider. Get at least 8 hours of sleep every night. Find ways to manage stress, such as meditation, deep breathing, or yoga. General instructions   Keep a journal to find out what may trigger your migraine headaches. For example, write down: What you eat and drink. How much sleep you get. Any change to your diet or medicines. If you have a migraine headache: Avoid things that make your symptoms worse, such as bright lights. It may help to lie down in a dark, quiet room. Do not drive or use heavy machinery. Ask your health care provider what activities are safe for you while you are experiencing symptoms. Keep all follow-up visits as told by your  health care provider. This is important. Contact a health care provider if: You develop symptoms that are different or more severe than your usual migraine headache symptoms. You have more than 15 headache days in one month. Get help right away if: Your migraine headache becomes severe. Your migraine headache lasts longer than 72 hours. You have a fever. You have a stiff neck. You have vision loss. Your muscles feel weak or like you cannot control them. You start to lose your balance often. You have trouble walking. You faint. You have a seizure. Summary A migraine headache is an intense, throbbing pain on one side or both sides of the head. Migraines may also cause other symptoms, such as nausea, vomiting, and sensitivity to light and noise. This condition may be treated with medicines and lifestyle changes. You may also need to avoid certain things that trigger a migraine headache. Keep a journal to find out what may trigger your migraine headaches. Contact your health care provider if you have more than 15 headache days in a month or you develop symptoms that are different or more severe than your usual migraine headache symptoms. This information is not intended to replace advice given to you by your health care provider. Make sure you discuss any questions you have with your health care provider. Document Revised: 05/03/2018 Document Reviewed: 02/21/2018 Elsevier Patient Education  2022 [place],   Merri Ray, MD [facility] 03/15/21 8:10 AM

## 2021-03-14 NOTE — Patient Instructions (Addendum)
You likely will need to restart Emgality, but can be discussed with neurology at appointment in 2 days.  For current headache, can take prednisone once tonight - low dose due to procedure tomorrow. Ok to take tizanidine, zofran and imitrex tonight.  If not improving then may need to be seen in ER for migraine treatment.  Good luck tomorrow.   Migraine Headache A migraine headache is an intense, throbbing pain on one side or both sides of the head. Migraine headaches may also cause other symptoms, such as nausea, vomiting, and sensitivity to light and noise. A migraine headache can last from 4 hours to 3 days. Talk with your doctor about what things may bring on (trigger) your migraine headaches. What are the causes? The exact cause of this condition is not known. However, a migraine may be caused when nerves in the brain become irritated and release chemicals that cause inflammation of blood vessels. This inflammation causes pain. This condition may be triggered or caused by: Drinking alcohol. Smoking. Taking medicines, such as: Medicine used to treat chest pain (nitroglycerin). Birth control pills. Estrogen. Certain blood pressure medicines. Eating or drinking products that contain nitrates, glutamate, aspartame, or tyramine. Aged cheeses, chocolate, or caffeine may also be triggers. Doing physical activity. Other things that may trigger a migraine headache include: Menstruation. Pregnancy. Hunger. Stress. Lack of sleep or too much sleep. Weather changes. Fatigue. What increases the risk? The following factors may make you more likely to experience migraine headaches: Being a certain age. This condition is more common in people who are 96-28 years old. Being female. Having a family history of migraine headaches. Being Caucasian. Having a mental health condition, such as depression or anxiety. Being obese. What are the signs or symptoms? The main symptom of this condition is  pulsating or throbbing pain. This pain may: Happen in any area of the head, such as on one side or both sides. Interfere with daily activities. Get worse with physical activity. Get worse with exposure to bright lights or loud noises. Other symptoms may include: Nausea. Vomiting. Dizziness. General sensitivity to bright lights, loud noises, or smells. Before you get a migraine headache, you may get warning signs (an aura). An aura may include: Seeing flashing lights or having blind spots. Seeing bright spots, halos, or zigzag lines. Having tunnel vision or blurred vision. Having numbness or a tingling feeling. Having trouble talking. Having muscle weakness. Some people have symptoms after a migraine headache (postdromal phase), such as: Feeling tired. Difficulty concentrating. How is this diagnosed? A migraine headache can be diagnosed based on: Your symptoms. A physical exam. Tests, such as: CT scan or an MRI of the head. These imaging tests can help rule out other causes of headaches. Taking fluid from the spine (lumbar puncture) and analyzing it (cerebrospinal fluid analysis, or CSF analysis). How is this treated? This condition may be treated with medicines that: Relieve pain. Relieve nausea. Prevent migraine headaches. Treatment for this condition may also include: Acupuncture. Lifestyle changes like avoiding foods that trigger migraine headaches. Biofeedback. Cognitive behavioral therapy. Follow these instructions at home: Medicines Take over-the-counter and prescription medicines only as told by your health care provider. Ask your health care provider if the medicine prescribed to you: Requires you to avoid driving or using heavy machinery. Can cause constipation. You may need to take these actions to prevent or treat constipation: Drink enough fluid to keep your urine pale yellow. Take over-the-counter or prescription medicines. Eat foods that are high in fiber,  such as beans, whole grains, and fresh fruits and vegetables. Limit foods that are high in fat and processed sugars, such as fried or sweet foods. Lifestyle Do not drink alcohol. Do not use any products that contain nicotine or tobacco, such as cigarettes, e-cigarettes, and chewing tobacco. If you need help quitting, ask your health care provider. Get at least 8 hours of sleep every night. Find ways to manage stress, such as meditation, deep breathing, or yoga. General instructions   Keep a journal to find out what may trigger your migraine headaches. For example, write down: What you eat and drink. How much sleep you get. Any change to your diet or medicines. If you have a migraine headache: Avoid things that make your symptoms worse, such as bright lights. It may help to lie down in a dark, quiet room. Do not drive or use heavy machinery. Ask your health care provider what activities are safe for you while you are experiencing symptoms. Keep all follow-up visits as told by your health care provider. This is important. Contact a health care provider if: You develop symptoms that are different or more severe than your usual migraine headache symptoms. You have more than 15 headache days in one month. Get help right away if: Your migraine headache becomes severe. Your migraine headache lasts longer than 72 hours. You have a fever. You have a stiff neck. You have vision loss. Your muscles feel weak or like you cannot control them. You start to lose your balance often. You have trouble walking. You faint. You have a seizure. Summary A migraine headache is an intense, throbbing pain on one side or both sides of the head. Migraines may also cause other symptoms, such as nausea, vomiting, and sensitivity to light and noise. This condition may be treated with medicines and lifestyle changes. You may also need to avoid certain things that trigger a migraine headache. Keep a journal to  find out what may trigger your migraine headaches. Contact your health care provider if you have more than 15 headache days in a month or you develop symptoms that are different or more severe than your usual migraine headache symptoms. This information is not intended to replace advice given to you by your health care provider. Make sure you discuss any questions you have with your health care provider. Document Revised: 05/03/2018 Document Reviewed: 02/21/2018 Elsevier Patient Education  [place].

## 2021-03-15 ENCOUNTER — Encounter: Payer: Self-pay | Admitting: Family Medicine

## 2021-03-15 NOTE — Progress Notes (Signed)
NEUROLOGY CONSULTATION NOTE  Lauren Chapman MRN: [id number] DOB: 1970-06-27  Referring provider: Merri Ray, MD Primary care provider: Merri Ray, MD  Reason for consult:  migraines  Assessment/Plan:   1  Chronic migraine/migraine with aura, without status migrainosus, intractable - medication overuse contributing 2  OSA 3  Polypharmacy  Migraine prevention:  Start Vyepti 100mg  every 3 months Migraine rescue:  Zembrace SymTouch.  May take with Zofran and tizanidine.  Stop sumatriptan tablet Stop all OTC analgesics Discussed diet/caffeine cessation/exercise Continue use of CPAP Keep headache diary Follow up 4 months    Subjective:  Lauren Chapman is a 51 year old right-handed female with emphysema, Bipolar disorder, depression, anxiety, and history of tubular adenoma of the colon who presents for migraines.  History supplemented by prior neurologist's and referring provider's notes.  Accompanied by her mother.  Onset:  66 years old Location:  left sided (frontal/retro-orbital) Quality:  pounding Intensity:  9-10/10.   Aura:  sometimes headaches preceded by visual aura (fuzzy vision/floaters in left eye) and phantosmia (metallic smell) Prodrome:  absent Associated symptoms:  Nausea, vomiting, photophobia, phonophobia, dizziness (once every 2 months).  She denies associated unilateral numbness or weakness. Duration:  1-2 days, sometimes 4-5 days (years ago would last 1-2 weeks) Frequency:  4 days a week Frequency of abortive medication: 5 days a week switches between sumatriptan and Excedrin Migraine Triggers/aggravating factors:  not eating, stress, worse when laying down Relieving factors:  nothing Activity:  aggravates  History of medication overuse leading to daily headaches in 2021. MRI of brain without contrast on 03/16/2020 personally reviewed revealed minimal nonspecific scattered hyperintense foci within the bilateral periventricular and subcortical white  matter, stable compared to prior MRI from 02/06/2017.  History of chronic low back pain.  In 2021 reported worsening left lower back pain with radicular pain down the left leg causing left leg weakness.  Also endorsed bowel and bladder incontinence.  MRI of lumbar spine without contrast on 03/09/2020 personally reviewed showed no spinal or foraminal stenosis.  MRI of cervical spine on 03/09/2020 personally reviewed showed mild spondylosis and disc bulging from C3-4 to C6-7 with mild left foraminal stenosis at C4-5 but otherwise no spinal canal stenosis or foraminal stenosis.  MRI of thoracic spine on 03/16/2020 personally reviewed was unremarkable.    Current NSAIDS/analgesics:  Excedrin Migraine, ASA Current triptans:  sumatriptan 25mg  Current ergotamine:  none Current anti-emetic:  Zofran 4mg  Current muscle relaxants:  tizanidine 4mg  Current Antihypertensive medications:  none Current Antidepressant medications:  citalopram 20mg  daily Current Anticonvulsant medications:  gabapentin 800mg  three times daily, lamotrigine 150mg  BID Current anti-CGRP:  Emgality Current Vitamins/Herbal/Supplements:  none Current Antihistamines/Decongestants:  none Other therapy:  none Hormone/birth control:  none Other medications:  Abilify, buspirone, benztropine  Past NSAIDS/analgesics:  ibuprofen, naproxen, BC powder, diclofenac 75mg , meloxicam Past abortive triptans:  rizatriptan 10mg , sumatriptan 20mg  NS Past abortive ergotamine:  none Past muscle relaxants:  methocarbamol, cyclobenzaprine Past anti-emetic:  none Past antihypertensive medications:  propranolol Past antidepressant medications:  amitriptyline, imipramine, sertraline, mirtazapine, Wellbutrin, vortioxetine, vilazodone Past anticonvulsant medications:  none Past anti-CGRP:  Aimovig, Ajovy, Emgality Past vitamins/Herbal/Supplements:  none Past antihistamines/decongestants:  Flonase Other past therapies:  Botox  Caffeine:  4-5 pods a day of  coffee.  2 bottles cola daily Diet:  Drinks Bubbly but no water.  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  Eats junk food. Eats meat/chicken with starch vegetables/potatoes Exercise:  no Depression:  yes; Anxiety:  yes  Other pain:  Chronic back pain, lumbar radicular pain.  Sleep hygiene:  Diagnosed with OSA in June.  On CPAP but still without rested sleep.  Sleepwalks.   Family history of headache:  daughters (migraines)      PAST MEDICAL HISTORY: Past Medical History:  Diagnosis Date   Allergy    Anxiety    Bipolar affective psychosis ([place])    Complication of anesthesia    " Acting silly- waving at everyone"-Giddy   Depression    Emphysema lung ([facility])    inhaler prn,  followed by pcp and pulmonologist-- dr Margorie John   Family history of adverse reaction to anesthesia    Father had rheumatic fever, and died on operating table.   GERD (gastroesophageal reflux disease)    Kidney cysts 01/24/2020   Right Kidney   Migraine    Smokers' cough ([place])    per pt not productive   SUI (stress urinary incontinence, female)    Tubular adenoma of colon 07/08/2020   Wears glasses     PAST SURGICAL HISTORY: Past Surgical History:  Procedure Laterality Date   ABDOMINAL HYSTERECTOMY N/A    [employer]reesia 11/30/2019   CARPAL TUNNEL RELEASE Right 09-12-2007   @[facility]    and GANGLION CYST EXCISION   COLONOSCOPY     CYSTOSCOPY N/A 04/29/2019   Procedure: CYSTOSCOPY;  Surgeon: Joseph Pierini, MD;  Location: [facility];  Service: Gynecology;  Laterality: N/A;   DILATATION & CURETTAGE/HYSTEROSCOPY WITH MYOSURE N/A 11/13/2018   Procedure: DILATATION & CURETTAGE/HYSTEROSCOPY WITH MYOSURE;  Surgeon: Anastasio Auerbach, MD;  Location: [facility];  Service: Gynecology;  Laterality: N/A;  request 9:00am OR start time in [place] block requests one hour   [place]  02/2019   EYE SURGERY N/A    [employer]reesia 11/30/2019    GANGLION CYST EXCISION     R wrist   KNEE ARTHROSCOPY WITH ANTERIOR CRUCIATE LIGAMENT (ACL) REPAIR Right 10/2015   LAPAROSCOPIC CHOLECYSTECTOMY  1997   LAPAROSCOPIC HYSTERECTOMY Bilateral 04/29/2019   Procedure: HYSTERECTOMY TOTAL LAPAROSCOPIC, BILATERAL SALPINO-OOPHORECTOMY;  Surgeon: Joseph Pierini, MD;  Location: [facility];  Service: Gynecology;  Laterality: Bilateral;   TOOTH EXTRACTION  04/01/2019   tooth removal Bilateral 2022   4 teeth removed   UPPER GASTROINTESTINAL ENDOSCOPY      MEDICATIONS: Current Outpatient Medications on File Prior to Visit  Medication Sig Dispense Refill   albuterol (VENTOLIN HFA) 108 (90 Base) MCG/ACT inhaler Inhale 2 puffs into the lungs every 4 (four) hours as needed for wheezing or shortness of breath. 1 each 1   ARIPiprazole (ABILIFY) 30 MG tablet Take 30 mg by mouth at bedtime.     aspirin-acetaminophen-caffeine (EXCEDRIN MIGRAINE) 250-250-65 MG tablet Take 2 tablets by mouth every 6 (six) hours as needed for headache.     benzonatate (TESSALON) 100 MG capsule Take 1 capsule (100 mg total) by mouth 3 (three) times daily as needed for cough. 30 capsule 0   benztropine (COGENTIN) 0.5 MG tablet Take 0.5 mg by mouth 2 (two) times daily.     busPIRone (BUSPAR) 15 MG tablet Take 15 mg by mouth 3 (three) times daily.     citalopram (CELEXA) 20 MG tablet Take 20 mg by mouth daily.     esomeprazole (NEXIUM) 20 MG capsule Take 1 capsule (20 mg total) by mouth 2 (two) times daily before a meal. (Patient taking differently: Take 20 mg by mouth 2 (two) times daily as needed (indigestion).) 60 capsule  1   gabapentin (NEURONTIN) 800 MG tablet Take 800 mg by mouth 3 (three) times daily.      Galcanezumab-gnlm (EMGALITY) 120 MG/ML SOAJ Inject 120 mg into the skin every 30 (thirty) days. (Patient not taking: Reported on 03/14/2021) 3 mL 3   lamoTRIgine (LAMICTAL) 150 MG tablet Take 150 mg by mouth 2 (two) times daily.      ondansetron (ZOFRAN) 4 MG  tablet Take 1 tablet (4 mg total) by mouth every 8 (eight) hours as needed for nausea or vomiting. 20 tablet 6   predniSONE (DELTASONE) 10 MG tablet Take 1 tablet (10 mg total) by mouth daily with breakfast. 1 tablet 0   SUMAtriptan (IMITREX) 25 MG tablet Take 1 tablet (25 mg total) by mouth every 2 (two) hours as needed for migraine. May repeat in 2 hours if headache persists or recurs. 10 tablet 6   tiZANidine (ZANAFLEX) 4 MG tablet Take 1 tablet (4 mg total) by mouth every 6 (six) hours as needed for muscle spasms. TAKE 1 TABLET BY MOUTH EVERY 6 HOURS AS NEEDED FOR MUSCLE SPASMS. 30 tablet 6   No current facility-administered medications on file prior to visit.    ALLERGIES: Allergies  Allergen Reactions   Codeine Anaphylaxis    Anything with codeine    FAMILY HISTORY: Family History  Problem Relation Age of Onset   Depression Mother    Cancer Mother        Cervical   Alcohol abuse Father    Crohn's disease Sister    Stroke Brother 51   Migraines Daughter    GER disease Daughter    Depression Daughter    Migraines Daughter    GER disease Daughter    Colon cancer Neg Hx    Rectal cancer Neg Hx    Stomach cancer Neg Hx     Objective:  Blood pressure 106/72, pulse 66, resp. rate 18, height 5\' 3"  (1.6 m), weight 189 lb (85.7 kg), last menstrual period 05/02/2019, SpO2 95 %. General: No acute distress.  Patient appears well-groomed.   Head:  Normocephalic/atraumatic Eyes:  fundi examined but not visualized Neck: supple, no paraspinal tenderness, full range of motion Back: No paraspinal tenderness Heart: regular rate and rhythm Lungs: Clear to auscultation bilaterally. Vascular: No carotid bruits. Neurological Exam: Mental status: alert and oriented to person, place, and time, recent and remote memory intact, fund of knowledge intact, attention and concentration intact, speech fluent and not dysarthric, language intact. Cranial nerves: CN I: not tested CN II: pupils  equal, round and reactive to light, visual fields intact CN III, IV, VI:  full range of motion, no nystagmus, no ptosis CN V: facial sensation intact. CN VII: upper and lower face symmetric CN VIII: hearing intact CN IX, X: gag intact, uvula midline CN XI: sternocleidomastoid and trapezius muscles intact CN XII: tongue midline Bulk & Tone: normal, no fasciculations. Motor:  muscle strength 5/5 throughout Sensation:  Pinprick, temperature and vibratory sensation intact. Deep Tendon Reflexes:  2+ throughout,  toes downgoing.   Finger to nose testing:  Without dysmetria.   Heel to shin:  Without dysmetria.   Gait:  antalgic gait.  Romberg negative    Thank you for allowing me to take part in the care of this patient.  Metta Clines, DO  CC: Merri Ray, MD

## 2021-03-16 ENCOUNTER — Encounter: Payer: Self-pay | Admitting: Neurology

## 2021-03-16 ENCOUNTER — Telehealth: Payer: Self-pay | Admitting: Pharmacy Technician

## 2021-03-16 ENCOUNTER — Ambulatory Visit (INDEPENDENT_AMBULATORY_CARE_PROVIDER_SITE_OTHER): Payer: Medicare Other | Admitting: Neurology

## 2021-03-16 ENCOUNTER — Other Ambulatory Visit: Payer: Self-pay

## 2021-03-16 VITALS — BP 106/72 | HR 66 | Resp 18 | Ht 63.0 in | Wt 189.0 lb

## 2021-03-16 DIAGNOSIS — Z79899 Other long term (current) drug therapy: Secondary | ICD-10-CM | POA: Diagnosis not present

## 2021-03-16 DIAGNOSIS — G43119 Migraine with aura, intractable, without status migrainosus: Secondary | ICD-10-CM

## 2021-03-16 DIAGNOSIS — G4733 Obstructive sleep apnea (adult) (pediatric): Secondary | ICD-10-CM | POA: Diagnosis not present

## 2021-03-16 MED ORDER — ZEMBRACE SYMTOUCH 3 MG/0.5ML ~~LOC~~ SOAJ: 3.0000 mg | SUBCUTANEOUS | 5 refills | Status: DC | PRN

## 2021-03-16 NOTE — Telephone Encounter (Signed)
DR. Cristy Friedlander note:  Auth Submission: APPROVED Payer: [facility] MEDICARE/MEDICAID Medication & CPT/J Code(s) submitted: VYEPIT J3030 Route of submission (phone, fax, portal): PORTAL Auth type: Buy/Bill Units/visits requested: 100 MG Q3MO Reference number: [id number] Approval from: 03/16/21 to 03/16/22   Patient will be scheduled as soon as possible.

## 2021-03-16 NOTE — Patient Instructions (Signed)
°  Start Vyepti 100mg  IV infusion every 3 months.  Take Zembrace sumatriptan shot at earliest onset of headache.  May repeat dose once in 1 hour if needed.  Maximum 2 tablets in 24 hours.  May take with Zofran and tizanidine Stop the sumatriptan tablet Stop all over the counter pain relievers.  Rule of thumb:  Limit use of pain relievers to no more than 2 days out of the week.  These medications include acetaminophen, NSAIDs (ibuprofen/Advil/Motrin, naproxen/Aleve, triptans (Imitrex/sumatriptan), Excedrin, and narcotics.  This will help reduce risk of rebound headaches. Be aware of common food triggers:  - Caffeine:  coffee, black tea, cola, Mt. Dew  - Chocolate  - Dairy:  aged cheeses (brie, blue, cheddar, gouda, [place], provolone, [place], Swiss, etc), chocolate milk, buttermilk, sour cream, limit eggs and yogurt  - Nuts, peanut butter  - Alcohol  - Cereals/grains:  FRESH breads (fresh bagels, sourdough, doughnuts), yeast productions  - Processed/canned/aged/cured meats (pre-packaged deli meats, hotdogs)  - MSG/glutamate:  soy sauce, flavor enhancer, pickled/preserved/marinated foods  - Sweeteners:  aspartame (Equal, Nutrasweet).  Sugar and Splenda are okay  - Vegetables:  legumes (lima beans, lentils, snow peas, fava beans, pinto peans, peas, garbanzo beans), sauerkraut, onions, olives, pickles  - Fruit:  avocados, bananas, citrus fruit (orange, lemon, grapefruit), mango  - Other:  Frozen meals, macaroni and cheese Routine exercise Stay adequately hydrated (aim for 64 oz water daily) Keep headache diary Maintain proper stress management Maintain proper sleep hygiene Do not skip meals Consider supplements:  magnesium citrate 400mg  daily, riboflavin 400mg  daily, coenzyme Q10 100mg  three times daily.

## 2021-03-18 ENCOUNTER — Telehealth: Payer: Self-pay

## 2021-03-18 NOTE — Telephone Encounter (Signed)
New message   Your information has been sent to [employer].  Lauren Chapman (Key: [id number]) Zembrace SymTouch 3MG /0.5ML auto-injectors   Form [employer] Medicare Part D Electronic Prior Authorization Form (2017 NCPDP) Created 18 hours ago Sent to Plan 18 hours ago Plan Response 18 hours ago Submit Clinical Questions less than a minute ago Determination Wait for Determination Please wait for [employer] 2017 NCPDP to return a determination.

## 2021-03-25 ENCOUNTER — Other Ambulatory Visit: Payer: Self-pay

## 2021-03-25 ENCOUNTER — Ambulatory Visit (INDEPENDENT_AMBULATORY_CARE_PROVIDER_SITE_OTHER): Payer: Medicare Other

## 2021-03-25 VITALS — BP 111/75 | HR 65 | Temp 98.5°F | Resp 18 | Ht 63.0 in | Wt 185.8 lb

## 2021-03-25 DIAGNOSIS — G43709 Chronic migraine without aura, not intractable, without status migrainosus: Secondary | ICD-10-CM

## 2021-03-25 MED ORDER — METHYLPREDNISOLONE SODIUM SUCC 125 MG IJ SOLR: 125.0000 mg | Freq: Once | INTRAMUSCULAR | Status: DC | PRN

## 2021-03-25 MED ORDER — SODIUM CHLORIDE 0.9 % IV SOLN
100.0000 mg | Freq: Once | INTRAVENOUS | Status: AC
  Administered 2021-03-25: 100 mg via INTRAVENOUS
  Filled 2021-03-25: qty 1

## 2021-03-25 MED ORDER — SODIUM CHLORIDE 0.9 % IV SOLN: Freq: Once | INTRAVENOUS | Status: DC | PRN

## 2021-03-25 MED ORDER — EPINEPHRINE 0.3 MG/0.3ML IJ SOAJ: 0.3000 mg | Freq: Once | INTRAMUSCULAR | Status: DC | PRN

## 2021-03-25 MED ORDER — ALBUTEROL SULFATE HFA 108 (90 BASE) MCG/ACT IN AERS: 2.0000 | INHALATION_SPRAY | Freq: Once | RESPIRATORY_TRACT | Status: DC | PRN

## 2021-03-25 MED ORDER — DIPHENHYDRAMINE HCL 50 MG/ML IJ SOLN: 50.0000 mg | Freq: Once | INTRAMUSCULAR | Status: DC | PRN

## 2021-03-25 MED ORDER — FAMOTIDINE IN NACL 20-0.9 MG/50ML-% IV SOLN: 20.0000 mg | Freq: Once | INTRAVENOUS | Status: DC | PRN

## 2021-03-25 NOTE — Progress Notes (Signed)
Diagnosis: Migraine with aura ? ?Provider:  Marshell Garfinkel, MD ? ?Procedure: Infusion ? ?IV Type: Peripheral, IV Location: L Hand ? ?Vyepti (Eptinezumab-jjmr), Dose: 100 mg ? ?Infusion Start Time: 14.39 03/25/2021 ? ?Infusion Stop Time: 15.12 03/25/2021 ? ?Post Infusion IV Care: 30 minutes post Observation period completed and Peripheral IV Discontinued ? ?Discharge: Condition: Good, Destination: Home . AVS provided to patient.  ? ?Performed by:  Jayle Solarz, RN  ?  ?

## 2021-03-29 ENCOUNTER — Emergency Department (HOSPITAL_COMMUNITY)
Admission: EM | Admit: 2021-03-29 | Discharge: 2021-03-29 | Disposition: A | Discharge status: Home / Self Care | Payer: Medicare Other | Attending: Emergency Medicine | Admitting: Emergency Medicine

## 2021-03-29 ENCOUNTER — Other Ambulatory Visit: Payer: Self-pay

## 2021-03-29 DIAGNOSIS — F17210 Nicotine dependence, cigarettes, uncomplicated: Secondary | ICD-10-CM | POA: Insufficient documentation

## 2021-03-29 DIAGNOSIS — T782XXA Anaphylactic shock, unspecified, initial encounter: Secondary | ICD-10-CM | POA: Diagnosis present

## 2021-03-29 MED ORDER — METHYLPREDNISOLONE SODIUM SUCC 125 MG IJ SOLR
125.0000 mg | Freq: Once | INTRAMUSCULAR | Status: AC
  Administered 2021-03-29: 125 mg via INTRAVENOUS
  Filled 2021-03-29: qty 2

## 2021-03-29 MED ORDER — EPINEPHRINE 0.3 MG/0.3ML IJ SOAJ: 0.3000 mg | INTRAMUSCULAR | 0 refills | Status: AC | PRN

## 2021-03-29 MED ORDER — ACETAMINOPHEN 500 MG PO TABS: 1000.0000 mg | ORAL_TABLET | Freq: Four times a day (QID) | ORAL | Status: DC | PRN

## 2021-03-29 MED ORDER — FAMOTIDINE IN NACL 20-0.9 MG/50ML-% IV SOLN
20.0000 mg | Freq: Once | INTRAVENOUS | Status: AC
  Administered 2021-03-29: 20 mg via INTRAVENOUS
  Filled 2021-03-29: qty 50

## 2021-03-29 MED ORDER — SODIUM CHLORIDE 0.9 % IV BOLUS
1000.0000 mL | Freq: Once | INTRAVENOUS | Status: AC
  Administered 2021-03-29: 1000 mL via INTRAVENOUS

## 2021-03-29 NOTE — ED Provider Notes (Addendum)
[facility] DEPT Provider Note   CSN: [id number] Arrival date & time: 03/29/21  1242     History  Chief Complaint  Patient presents with   Allergic Reaction    Lauren Chapman is a 51 y.o. female with history of depression, anxiety, anaphylaxis, GERD.  Patient presents to ED for evaluation back to anaphylactic reaction.  Patient states earlier today she was at work when she stepped outside for smoke break.  The patient states that when she bent down to put her cigarette out she felt a pinch and burn on her left butt cheek.  Patient states at this time she began immediately experiencing chest tightness, dry itchy throat and shortness of breath.  Patient states that at this time she called EMS who arrived to her work and treated her with 0.3 mg of epi, 50 mg Benadryl for suspected anaphylactic reaction.  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  Patient is endorsing chest tightness, trouble swallowing, shortness of breath. The patient denies wheezing, nausea, vomiting, diarrhea, lightheadedness, dizziness, weakness, hives or skin changes, chest pain.   Allergic Reaction Presenting symptoms: difficulty swallowing   Presenting symptoms: no rash and no wheezing       Home Medications Prior to Admission medications   Medication Sig Start Date End Date Taking? Authorizing Provider  EPINEPHrine 0.3 mg/0.3 mL IJ SOAJ injection Inject 0.3 mg into the muscle as needed for anaphylaxis. 03/29/21  Yes Azucena Cecil, PA-C  albuterol (VENTOLIN HFA) 108 (90 Base) MCG/ACT inhaler Inhale 2 puffs into the lungs every 4 (four) hours as needed for wheezing or shortness of breath. 02/20/20   Olalere, Cicero Duck A, MD  ARIPiprazole (ABILIFY) 30 MG tablet Take 30 mg by mouth at bedtime. 12/23/20   [provider]  aspirin-acetaminophen-caffeine (EXCEDRIN MIGRAINE)  [phone] MG tablet Take 2 tablets by mouth every 6 (six) hours as needed for headache.    [provider]  benzonatate (TESSALON) 100 MG capsule Take 1 capsule (100 mg total) by mouth 3 (three) times daily as needed for cough. Patient not taking: Reported on 03/16/2021 12/28/20   Brunetta Jeans, PA-C  benztropine (COGENTIN) 0.5 MG tablet Take 0.5 mg by mouth 2 (two) times daily. 12/23/20   [provider]  busPIRone (BUSPAR) 15 MG tablet Take 15 mg by mouth 3 (three) times daily. 07/30/20   [provider]  citalopram (CELEXA) 20 MG tablet Take 20 mg by mouth daily.    [provider]  esomeprazole (NEXIUM) 20 MG capsule Take 1 capsule (20 mg total) by mouth 2 (two) times daily before a meal. Patient taking differently: Take 20 mg by mouth 2 (two) times daily as needed (indigestion). 03/21/19   Daleen Squibb, MD  gabapentin (NEURONTIN) 800 MG tablet Take 800 mg by mouth 3 (three) times daily.  07/02/18   [provider]  Galcanezumab-gnlm (EMGALITY) 120 MG/ML SOAJ Inject 120 mg into the skin every 30 (thirty) days. Patient not taking: Reported on 03/14/2021 03/24/20   Marcial Pacas, MD  lamoTRIgine (LAMICTAL) 150 MG tablet Take 150 mg by mouth 2 (two) times daily.     [provider]  ondansetron (ZOFRAN) 4 MG tablet Take 1 tablet (4 mg total) by mouth every 8 (eight) hours as needed for nausea or vomiting. 01/05/20   Suzzanne Cloud, NP  predniSONE (DELTASONE) 10 MG tablet  Take 1 tablet (10 mg total) by mouth daily with breakfast. Patient not taking: Reported on 03/16/2021 03/14/21   Wendie Agreste, MD  SUMAtriptan Succinate [facility]) 3 MG/0.5ML SOAJ Inject 3 mg into the skin as needed (May repeat after 1 hour.  Maximum 2 injections in 24 hours.). 03/16/21   Pieter Partridge, DO  tiZANidine (ZANAFLEX) 4 MG tablet Take 1 tablet (4 mg total) by mouth every 6 (six) hours as needed for muscle spasms. TAKE 1 TABLET BY MOUTH EVERY 6 HOURS AS  NEEDED FOR MUSCLE SPASMS. 12/10/20   Wendie Agreste, MD      Allergies    Codeine    Review of Systems   Review of Systems  HENT:  Positive for trouble swallowing.   Respiratory:  Positive for chest tightness and shortness of breath. Negative for wheezing.   Cardiovascular:  Negative for chest pain.  Gastrointestinal:  Negative for abdominal pain, diarrhea, nausea and vomiting.  Skin:  Negative for rash.  Neurological:  Negative for dizziness, weakness and light-headedness.  All other systems reviewed and are negative.  Physical Exam Updated Vital Signs BP 123/72    Pulse 71    Temp 98.4 F (36.9 C) (Oral) Comment: Simultaneous filing. User may not have seen previous data. Comment (Src): Simultaneous filing. User may not have seen previous data.   Resp (!) 23    Ht '5\' 3"'$  (1.6 m)    Wt 83.9 kg    LMP 05/02/2019 Comment: irregular cycles   SpO2 99%    BMI 32.77 kg/m  Physical Exam Vitals and nursing note reviewed.  Constitutional:      General: She is not in acute distress.    Appearance: Normal appearance. She is not ill-appearing, toxic-appearing or diaphoretic.  HENT:     Head: Normocephalic and atraumatic.     Nose: Nose normal. No congestion.     Mouth/Throat:     Mouth: Mucous membranes are moist.     Comments: Patient handling secretions appropriately, no respiratory distress noted. Eyes:     Extraocular Movements: Extraocular movements intact.     Conjunctiva/sclera: Conjunctivae normal.     Pupils: Pupils are equal, round, and reactive to light.  Cardiovascular:     Rate and Rhythm: Normal rate and regular rhythm.     Pulses: Normal pulses.     Heart sounds: Normal heart sounds.  Pulmonary:     Effort: Pulmonary effort is normal. No respiratory distress.     Breath sounds: Normal breath sounds. No stridor. No wheezing, rhonchi or rales.  Abdominal:     General: Abdomen is flat. Bowel sounds are normal. There is no distension.     Palpations: Abdomen is soft.  There is no mass.     Tenderness: There is no abdominal tenderness. There is no guarding.  Musculoskeletal:     Cervical back: Normal range of motion and neck supple. No tenderness.  Skin:    General: Skin is warm and dry.     Capillary Refill: Capillary refill takes less than 2 seconds.     Comments: There is no noticeable mark, erythema, appearance of skin irritation or bee sting noted to the patient's left butt cheek where she believes that she was stung.  Neurological:     Mental Status: She is alert and oriented to person, place, and time.    ED Results / Procedures / Treatments   Labs (all labs ordered are listed, but only abnormal results are displayed) Labs Reviewed -  No data to display  EKG None  Radiology No results found.  Procedures Procedures    Medications Ordered in ED Medications  acetaminophen (TYLENOL) tablet 1,000 mg (has no administration in time range)  methylPREDNISolone sodium succinate (SOLU-MEDROL) 125 mg/2 mL injection 125 mg (125 mg Intravenous Given 03/29/21 1329)  famotidine (PEPCID) IVPB 20 mg premix (0 mg Intravenous Stopped 03/29/21 1359)  sodium chloride 0.9 % bolus 1,000 mL (0 mLs Intravenous Stopped 03/29/21 1550)    ED Course/ Medical Decision Making/ A&P Clinical Course as of 03/29/21 1625  Tue Mar 29, 2021  1513 51 yo female presenting to ED with concern for allergic reaction, hx of anaphylaxis to bee stings, reports possible "sting" on buttocks while outside at work today.  EMS gave epi and benadryl.  Here given steroids, 1 liter IVF for hypotension on arrival, and benadryl.  No airway compromise on exam, BP has been stable [MT]  1526 Oropharynx non-erythematous.  No tonsillar swelling or exudate.  No uvular deviation.  No drooling. No brawny edema. No stridor. Voice is not muffled.   [MT]  1527 Will observe 4 hours after epi and discharge, will provide epi prescription as she ran out [MT]    Clinical Course User Index [MT] Trifan,  Carola Rhine, MD                           Medical Decision Making Risk OTC drugs. Prescription drug management.   51 year old female presents ED for evaluation of possible allergic reaction/anaphylaxis.  Please see HPI for further details. On examination, the patient is afebrile, nontachycardic, nonhypoxic.  The patient has clear lung sounds bilaterally, there is no wheezing noted.  The patient's abdomen is soft and compressible in all 4 quadrants, she denies any nausea or vomiting, GI upset.  The patient denies any skin irritation, appearance of hives.  The patient is handling secretions appropriately, there is no drooling, no muffled voice, patient uvula midline.  Patient oxygen saturation is 95% on room air.  Patient has no angioedema, lip or facial swelling.  Patient noted to be hypotensive on arrival to the ED with a blood pressure 105/82.  Patient treated utilizing 1 L normal saline.  Patient received 0.3 mg Epi, 50 mg Benadryl prior to arrival via EMS.  Here in the department, patient was observed for 4 hours.  Patient received 125 mg Solu-Medrol as well as 20 mg famotidine.  While here in the department, the patient's vital signs remained stable.  She has remained asymptomatic and has been resting comfortably in bed.  At this time, after discussing the patient and her case with my attending Dr. Langston Masker, we believe that this patient is stable for discharge home.  The patient will be sent home with an EpiPen prescription.  The patient will be advised to follow-up with her PCP for any ongoing management or allergy testing.  Patient had all her questions answered to her satisfaction.  The patient has been provided with return precautions and she voiced understanding.  Patient agreeable to plan.  Patient stable at this time for discharge.   Final Clinical Impression(s) / ED Diagnoses Final diagnoses:  Anaphylaxis, initial encounter    Rx / DC Orders ED Discharge Orders          Ordered     EPINEPHrine 0.3 mg/0.3 mL IJ SOAJ injection  As needed        03/29/21 1558  Azucena Cecil, PA-C 03/29/21 1626    Wyvonnia Dusky, MD 03/29/21 [phone]

## 2021-03-29 NOTE — ED Notes (Signed)
Pt ambulatory to restroom, pt states she feels a little dizzy and denies shortness of breath  ?

## 2021-03-29 NOTE — ED Notes (Signed)
Pt mother called and updated on pt status and events leading to ED visit. Per pt request  ?

## 2021-03-29 NOTE — ED Triage Notes (Addendum)
Pt arrived by EMS from work after getting stung by a bee or wasp on the left buttocks, pt has hx of allergies to bees/wasp ?Pt complaining of trouble swallowing, itching on arms and dizziness.  ?EMS gave 0.'3mg'$  Epi and '50mg'$  PO benadryl  ? ?

## 2021-03-29 NOTE — Discharge Instructions (Addendum)
Return to ED with any new symptoms such as chest tightness, shortness of breath, itchy dry throat, throat tightness ?Please follow-up with your PCP in the next 5 to 7 days for any ongoing management/evaluation that you may need ?Please pick up your EpiPen that I have sent in for you ?Please review the attached literature I have included regarding anaphylactic reactions. ?

## 2021-04-01 ENCOUNTER — Other Ambulatory Visit: Payer: Self-pay | Admitting: Neurology

## 2021-04-01 ENCOUNTER — Telehealth: Payer: Self-pay | Admitting: Neurology

## 2021-04-01 MED ORDER — SUMATRIPTAN SUCCINATE 6 MG/0.5ML ~~LOC~~ SOLN: 6.0000 mg | SUBCUTANEOUS | 5 refills | Status: DC | PRN

## 2021-04-01 NOTE — Telephone Encounter (Signed)
Pt advised DR.Jaffe sent generic sumatriptan '6mg'$  injection has been sent to her pharmacy. ?Please give [place] a call if you have any problems. ?

## 2021-04-01 NOTE — Telephone Encounter (Signed)
Patient called and stated that her imitrex injection was denied, they need more info from the Dr.  She uses [facility]. ?

## 2021-04-01 NOTE — Telephone Encounter (Signed)
Telephone call to [facility], Spoke to [employer] was unable to apply a manufacture coupon to the [employer] due to insurance.  ?

## 2021-04-25 ENCOUNTER — Ambulatory Visit: Payer: Medicare Other | Admitting: Family Medicine

## 2021-04-28 ENCOUNTER — Ambulatory Visit: Payer: Medicare Other | Admitting: Family Medicine

## 2021-05-20 ENCOUNTER — Ambulatory Visit: Payer: Medicare Other | Admitting: Family Medicine

## 2021-05-27 ENCOUNTER — Other Ambulatory Visit: Payer: Self-pay

## 2021-05-27 ENCOUNTER — Ambulatory Visit (INDEPENDENT_AMBULATORY_CARE_PROVIDER_SITE_OTHER)
Admission: RE | Admit: 2021-05-27 | Discharge: 2021-05-27 | Disposition: A | Discharge status: Home / Self Care | Payer: Medicare Other | Source: Ambulatory Visit | Attending: Family Medicine | Admitting: Family Medicine

## 2021-05-27 ENCOUNTER — Ambulatory Visit (INDEPENDENT_AMBULATORY_CARE_PROVIDER_SITE_OTHER): Payer: Medicare Other | Admitting: Family Medicine

## 2021-05-27 ENCOUNTER — Encounter: Payer: Self-pay | Admitting: Family Medicine

## 2021-05-27 VITALS — BP 116/74 | HR 83 | Temp 98.3°F | Resp 18 | Ht 63.0 in | Wt 188.0 lb

## 2021-05-27 DIAGNOSIS — R7303 Prediabetes: Secondary | ICD-10-CM

## 2021-05-27 DIAGNOSIS — M79671 Pain in right foot: Secondary | ICD-10-CM

## 2021-05-27 DIAGNOSIS — E781 Pure hyperglyceridemia: Secondary | ICD-10-CM | POA: Diagnosis not present

## 2021-05-27 LAB — COMPREHENSIVE METABOLIC PANEL
ALT: 19 U/L (ref 0–35)
AST: 15 U/L (ref 0–37)
Albumin: 4.3 g/dL (ref 3.5–5.2)
Alkaline Phosphatase: 82 U/L (ref 39–117)
BUN: 9 mg/dL (ref 6–23)
CO2: 30 mEq/L (ref 19–32)
Calcium: 9.3 mg/dL (ref 8.4–10.5)
Chloride: 103 mEq/L (ref 96–112)
Creatinine, Ser: 0.95 mg/dL (ref 0.40–1.20)
GFR: 68.61 mL/min (ref >60.00)
Glucose, Bld: 83 mg/dL (ref 70–99)
Potassium: 4.4 mEq/L (ref 3.5–5.1)
Sodium: 140 mEq/L (ref 135–145)
Total Bilirubin: 0.4 mg/dL (ref 0.2–1.2)
Total Protein: 6.7 g/dL (ref 6.0–8.3)

## 2021-05-27 LAB — LIPID PANEL
Cholesterol: 180 mg/dL (ref 0–200)
HDL: 44.4 mg/dL (ref >39.00)
LDL Cholesterol: 99 mg/dL (ref 0–99)
NonHDL: 135.98
Total CHOL/HDL Ratio: 4
Triglycerides: 184 mg/dL — ABNORMAL HIGH (ref 0.0–149.0)
VLDL: 36.8 mg/dL (ref 0.0–40.0)

## 2021-05-27 LAB — HEMOGLOBIN A1C: Hgb A1c MFr Bld: 5.5 % (ref 4.6–6.5)

## 2021-05-27 NOTE — Progress Notes (Signed)
? ?Subjective:  ?Patient ID: Lauren Chapman, female    DOB: 11/16/1970  Age: 51 y.o. MRN: [id number] ? ?CC:  ?Chief Complaint  ?Patient presents with  ? Follow-up  ?  Patient states she is here for blood work.  ? Foot Pain  ?  Patient states is having right foot pain. Its feels like it has a burning feeling that comes and goes  ? ? ?HPI ?Lauren Chapman presents for  ? ?Right foot pain ?Noted at joint of great toe on right. Off and on past year.  ?No known injury.  ?Burning/numb feeling into great toe only, MTP area. No instep/posterior pain.  ?Tx; aspercreme - no relief. On gabapentin for anxiety. ?Some hip burning, into calf at times past 6 months. Tried PT for prior left sided pain without relief. No treatment for R side.  ? ? ?History of back pain with left-sided sciatica.  MRI of lumbar spine ordered by neurology in February 2022, unremarkable without spinal stenosis or foraminal narrowing.  Mild spondylosis and disc bulging from T11-12 down to L4-5.  ? ?Hypertriglyceridemia ?Mild elevation on labs in May 2022 , no current meds. ?Last ate last night. Sip of coke this am.  ? ?Prediabetes: ?Borderline prediabetes on A1c in May 2022.  Weight few points higher since last visit. ?Exercise: some recent increased walking -10-15 min at a time.  ?Sugar-containing beverages: soda every 2-3 days.no sweet tea.  ? ?Lab Results  ?Component Value Date  ? HGBA1C 5.7 05/31/2020  ? ?Wt Readings from Last 3 Encounters:  ?05/27/21 188 lb (85.3 kg)  ?03/29/21 185 lb (83.9 kg)  ?03/25/21 185 lb 12.8 oz (84.3 kg)  ? ? ?History ?Patient Active Problem List  ? Diagnosis Date Noted  ? Depression 03/24/2020  ? Bilateral low back pain with left-sided sciatica 03/24/2020  ? Left lumbar radiculopathy 02/25/2020  ? Absence of bladder continence 02/25/2020  ? History of postmenopausal bleeding 04/29/2019  ? Polypharmacy 04/03/2019  ? Emphysema, unspecified ([place]) 05/17/2018  ? Weakness 01/25/2017  ? Chronic migraine w/o aura w/o status  migrainosus, not intractable 12/22/2014  ? Encounter for other general counseling or advice on contraception 09/02/2014  ? Bipolar disorder ([place]) 10/12/2011  ? Anxiety and depression 10/12/2011  ? GERD (gastroesophageal reflux disease) 10/12/2011  ? ?Past Medical History:  ?Diagnosis Date  ? Allergy   ? Anxiety   ? Bipolar affective psychosis ([place])   ? Complication of anesthesia   ? " Acting silly- waving at everyone"-Giddy  ? Depression   ? Emphysema lung ([place])   ? inhaler prn,  followed by pcp and pulmonologist-- dr Margorie John  ? Family history of adverse reaction to anesthesia   ? Father had rheumatic fever, and died on operating table.  ? GERD (gastroesophageal reflux disease)   ? Kidney cysts 01/24/2020  ? Right Kidney  ? Migraine   ? Smokers' cough ([place])   ? per pt not productive  ? SUI (stress urinary incontinence, female)   ? Tubular adenoma of colon 07/08/2020  ? Wears glasses   ? ?Past Surgical History:  ?Procedure Laterality Date  ? ABDOMINAL HYSTERECTOMY N/A   ? [employer]reesia 11/30/2019  ? CARPAL TUNNEL RELEASE Right 09-12-2007   '@[facility]'$   ? and GANGLION CYST EXCISION  ? COLONOSCOPY    ? CYSTOSCOPY N/A 04/29/2019  ? Procedure: CYSTOSCOPY;  Surgeon: Joseph Pierini, MD;  [facility];  Service: Gynecology;  Laterality: N/A;  ? DILATATION & CURETTAGE/HYSTEROSCOPY WITH MYOSURE N/A 11/13/2018  ?  Procedure: [place];  Surgeon: Anastasio Auerbach, MD;  Location: [facility];  Service: Gynecology;  Laterality: N/A;  request 9:00am OR start time in [place] block ?requests one hour  ? DILATION AND CURETTAGE OF UTERUS  02/2019  ? EYE SURGERY N/A   ? [employer]reesia 11/30/2019  ? GANGLION CYST EXCISION    ? R wrist  ? KNEE ARTHROSCOPY WITH ANTERIOR CRUCIATE LIGAMENT (ACL) REPAIR Right 10/2015  ? [place]  ? LAPAROSCOPIC HYSTERECTOMY Bilateral 04/29/2019  ? Procedure: HYSTERECTOMY TOTAL LAPAROSCOPIC, BILATERAL  SALPINO-OOPHORECTOMY;  Surgeon: Joseph Pierini, MD;  Location: [facility];  Service: Gynecology;  Laterality: Bilateral;  ? TOOTH EXTRACTION  04/01/2019  ? tooth removal Bilateral 2022  ? 4 teeth removed  ? UPPER GASTROINTESTINAL ENDOSCOPY    ? ?Allergies  ?Allergen Reactions  ? Codeine Anaphylaxis  ?  Anything with codeine  ? ?Prior to Admission medications   ?Medication Sig Start Date End Date Taking? Authorizing Provider  ?albuterol (VENTOLIN HFA) 108 (90 Base) MCG/ACT inhaler Inhale 2 puffs into the lungs every 4 (four) hours as needed for wheezing or shortness of breath. 02/20/20  Yes Olalere, Adewale A, MD  ?ARIPiprazole (ABILIFY) 30 MG tablet Take 30 mg by mouth at bedtime. 12/23/20  Yes [provider]  ?aspirin-acetaminophen-caffeine (EXCEDRIN MIGRAINE) [phone] MG tablet Take 2 tablets by mouth every 6 (six) hours as needed for headache.   Yes [provider]  ?benztropine (COGENTIN) 0.5 MG tablet Take 0.5 mg by mouth 2 (two) times daily. 12/23/20  Yes [provider]  ?busPIRone (BUSPAR) 15 MG tablet Take 15 mg by mouth 3 (three) times daily. 07/30/20  Yes [provider]  ?citalopram (CELEXA) 20 MG tablet Take 20 mg by mouth daily.   Yes [provider]  ?EPINEPHrine 0.3 mg/0.3 mL IJ SOAJ injection Inject 0.3 mg into the muscle as needed for anaphylaxis. 03/29/21  Yes Azucena Cecil, PA-C  ?esomeprazole (NEXIUM) 20 MG capsule Take 1 capsule (20 mg total) by mouth 2 (two) times daily before a meal. ?Patient taking differently: Take 20 mg by mouth 2 (two) times daily as needed (indigestion). 03/21/19  Yes Jacelyn Pi, Lilia Argue, MD  ?gabapentin (NEURONTIN) 800 MG tablet Take 800 mg by mouth 3 (three) times daily.  07/02/18  Yes [provider]  ?Galcanezumab-gnlm (EMGALITY) 120 MG/ML SOAJ Inject 120 mg into the skin every 30 (thirty) days. 03/24/20  Yes Marcial Pacas, MD  ?lamoTRIgine (LAMICTAL) 150 MG tablet Take 150 mg by mouth 2 (two) times  daily.    Yes [provider]  ?ondansetron (ZOFRAN) 4 MG tablet Take 1 tablet (4 mg total) by mouth every 8 (eight) hours as needed for nausea or vomiting. 01/05/20  Yes Suzzanne Cloud, NP  ?SUMAtriptan (IMITREX) 6 MG/0.5ML SOLN injection Inject 0.5 mLs (6 mg total) into the skin as needed for migraine or headache (May repeat after 1 hour.  Maximum 2 injections in 24 hours). May repeat in 2 hours if headache persists or recurs. 04/01/21  Yes Tomi Likens, Adam R, DO  ?tiZANidine (ZANAFLEX) 4 MG tablet Take 1 tablet (4 mg total) by mouth every 6 (six) hours as needed for muscle spasms. TAKE 1 TABLET BY MOUTH EVERY 6 HOURS AS NEEDED FOR MUSCLE SPASMS. 12/10/20  Yes Wendie Agreste, MD  ?benzonatate (TESSALON) 100 MG capsule Take 1 capsule (100 mg total) by mouth 3 (three) times daily as needed for cough. ?Patient not taking: Reported  on 05/27/2021 12/28/20   Brunetta Jeans, PA-C  ?predniSONE (DELTASONE) 10 MG tablet Take 1 tablet (10 mg total) by mouth daily with breakfast. ?Patient not taking: Reported on 05/27/2021 03/14/21   Wendie Agreste, MD  ? ?Social History  ? ?Socioeconomic History  ? Marital status: Divorced  ?  Spouse name: n/a  ? Number of children: 2  ? Years of education: 71  ? Highest education level: Not on file  ?Occupational History  ? Occupation: Disabled  ?  Comment: Formerly a Pharmacist, hospital.  ?Tobacco Use  ? Smoking status: Every Day  ?  Packs/day: 0.50  ?  Years: 33.00  ?  Pack years: 16.50  ?  Types: Cigarettes  ?  Start date: 10/10/1981  ?  Last attempt to quit: 11/10/2019  ?  Years since quitting: 1.5  ? Smokeless tobacco: Never  ?Vaping Use  ? Vaping Use: Never used  ?Substance and Sexual Activity  ? Alcohol use: Never  ?  Alcohol/week: 0.0 standard drinks  ? Drug use: Never  ? Sexual activity: Not Currently  ?  Partners: Male  ?  Birth control/protection: Post-menopausal  ?  Comment: 1st intercourse 51 yo-More than 5 partners  ?Other Topics Concern  ? Not on file  ?Social History Narrative  ?  Lives at home with 1 of her two daughters  ? Right-handed.  ? 2-4 cups caffeine daily.  ? Disabled   ? One story home  ? ?Social Determinants of Health  ? ?Financial Resource Strain: Not on file  ?Food Insecurity: Not

## 2021-05-27 NOTE — Patient Instructions (Addendum)
?  I will let you know if there are concerns on labs.  Continue to try to avoid sugar-containing beverages.  ? ?As we discussed there can be a couple of different contributors to your foot pain.  Could be Morton's neuroma or pinched nerve, or possible sesamoid bone issue.  No change in medications for now but I do recommend wearing a shoe with a wider toe box or wider upfront to allow space for your toes.  I will also refer you to podiatry.  If you are able to find a metatarsal cookie over-the-counter that sometimes can take the pressure off of the knuckles of the feet.  The [employer] on [address] may have these.  ? ?Please have xray performed: ?[facility]  ?Walk in 8:30-4:30 during weekdays, no appointment needed ?[place]  ?[place], [place] [address] ? ? ? ?If you have lab work done today you will be contacted with your lab results within the next 2 weeks.  If you have not heard from [place] then please contact us. The fastest way to get your results is to register for My Chart. ? ? ?IF you received an x-ray today, you will receive an invoice from [facility]. Please contact [facility] at [phone] with questions or concerns regarding your invoice.  ? ?IF you received labwork today, you will receive an invoice from [facility]. Please contact [facility] at [phone] with questions or concerns regarding your invoice.  ? ?Our billing staff will not be able to assist you with questions regarding bills from these companies. ? ?You will be contacted with the lab results as soon as they are available. The fastest way to get your results is to activate your My Chart account. Instructions are located on the last page of this paperwork. If you have not heard from [place] regarding the results in 2 weeks, please contact this office. ?  ? ? ?

## 2021-06-07 ENCOUNTER — Emergency Department (HOSPITAL_BASED_OUTPATIENT_CLINIC_OR_DEPARTMENT_OTHER): Payer: Medicare Other

## 2021-06-07 ENCOUNTER — Encounter (HOSPITAL_BASED_OUTPATIENT_CLINIC_OR_DEPARTMENT_OTHER): Payer: Self-pay

## 2021-06-07 ENCOUNTER — Emergency Department (HOSPITAL_BASED_OUTPATIENT_CLINIC_OR_DEPARTMENT_OTHER)
Admission: EM | Admit: 2021-06-07 | Discharge: 2021-06-08 | Disposition: A | Discharge status: Home / Self Care | Payer: Medicare Other | Attending: Emergency Medicine | Admitting: Emergency Medicine

## 2021-06-07 ENCOUNTER — Other Ambulatory Visit: Payer: Self-pay

## 2021-06-07 DIAGNOSIS — R519 Headache, unspecified: Secondary | ICD-10-CM | POA: Diagnosis present

## 2021-06-07 DIAGNOSIS — R112 Nausea with vomiting, unspecified: Secondary | ICD-10-CM | POA: Diagnosis not present

## 2021-06-07 DIAGNOSIS — H538 Other visual disturbances: Secondary | ICD-10-CM | POA: Insufficient documentation

## 2021-06-07 LAB — CBC WITH DIFFERENTIAL/PLATELET
Abs Immature Granulocytes: 0.02 10*3/uL (ref 0.00–0.07)
Basophils Absolute: 0 10*3/uL (ref 0.0–0.1)
Basophils Relative: 0 %
Eosinophils Absolute: 0.1 10*3/uL (ref 0.0–0.5)
Eosinophils Relative: 1 %
HCT: 39.8 % (ref 36.0–46.0)
Hemoglobin: 13.5 g/dL (ref 12.0–15.0)
Immature Granulocytes: 0 %
Lymphocytes Relative: 20 %
Lymphs Abs: 1.9 10*3/uL (ref 0.7–4.0)
MCH: 30.3 pg (ref 26.0–34.0)
MCHC: 33.9 g/dL (ref 30.0–36.0)
MCV: 89.2 fL (ref 80.0–100.0)
Monocytes Absolute: 0.5 10*3/uL (ref 0.1–1.0)
Monocytes Relative: 5 %
Neutro Abs: 7.2 10*3/uL (ref 1.7–7.7)
Neutrophils Relative %: 74 %
Platelets: 220 10*3/uL (ref 150–400)
RBC: 4.46 MIL/uL (ref 3.87–5.11)
RDW: 12.9 % (ref 11.5–15.5)
WBC: 9.7 10*3/uL (ref 4.0–10.5)
nRBC: 0 % (ref 0.0–0.2)

## 2021-06-07 LAB — SEDIMENTATION RATE: Sed Rate: 17 mm/hr (ref 0–22)

## 2021-06-07 LAB — BASIC METABOLIC PANEL
Anion gap: 11 (ref 5–15)
BUN: 11 mg/dL (ref 6–20)
CO2: 26 mmol/L (ref 22–32)
Calcium: 9.6 mg/dL (ref 8.9–10.3)
Chloride: 102 mmol/L (ref 98–111)
Creatinine, Ser: 1.16 mg/dL — ABNORMAL HIGH (ref 0.44–1.00)
GFR, Estimated: 56 mL/min — ABNORMAL LOW (ref >60)
Glucose, Bld: 103 mg/dL — ABNORMAL HIGH (ref 70–99)
Potassium: 3.8 mmol/L (ref 3.5–5.1)
Sodium: 139 mmol/L (ref 135–145)

## 2021-06-07 MED ORDER — IOHEXOL 350 MG/ML SOLN
100.0000 mL | Freq: Once | INTRAVENOUS | Status: AC | PRN
  Administered 2021-06-07: 75 mL via INTRAVENOUS

## 2021-06-07 MED ORDER — PROCHLORPERAZINE EDISYLATE 10 MG/2ML IJ SOLN
10.0000 mg | Freq: Once | INTRAMUSCULAR | Status: AC
  Administered 2021-06-07: 10 mg via INTRAVENOUS
  Filled 2021-06-07: qty 2

## 2021-06-07 MED ORDER — SODIUM CHLORIDE 0.9 % IV BOLUS
1000.0000 mL | Freq: Once | INTRAVENOUS | Status: AC
  Administered 2021-06-07: 1000 mL via INTRAVENOUS

## 2021-06-07 MED ORDER — METOCLOPRAMIDE HCL 5 MG/ML IJ SOLN
5.0000 mg | Freq: Once | INTRAMUSCULAR | Status: AC
  Administered 2021-06-07: 5 mg via INTRAVENOUS
  Filled 2021-06-07: qty 2

## 2021-06-07 MED ORDER — SODIUM CHLORIDE 0.9 % IV BOLUS
500.0000 mL | Freq: Once | INTRAVENOUS | Status: AC
  Administered 2021-06-07: 500 mL via INTRAVENOUS

## 2021-06-07 MED ORDER — KETOROLAC TROMETHAMINE 30 MG/ML IJ SOLN
30.0000 mg | Freq: Once | INTRAMUSCULAR | Status: AC
  Administered 2021-06-07: 30 mg via INTRAVENOUS
  Filled 2021-06-07: qty 1

## 2021-06-07 MED ORDER — DIPHENHYDRAMINE HCL 50 MG/ML IJ SOLN
12.5000 mg | Freq: Once | INTRAMUSCULAR | Status: AC
  Administered 2021-06-07: 12.5 mg via INTRAVENOUS
  Filled 2021-06-07: qty 1

## 2021-06-07 NOTE — Discharge Instructions (Signed)
You have been seen and discharged from the emergency department.  The CT imaging of your head and neck was normal.  Blood work was normal.  This is most likely a complicated migraine.  Follow-up with your neurologist for further evaluation and further care. Take home medications as prescribed. If you have any worsening symptoms or further concerns for your health please return to an emergency department for further evaluation, you would require more specialized neuroimaging including an MRI. ?

## 2021-06-07 NOTE — ED Triage Notes (Signed)
Pt states she has hx of migraines, but at 1030 she had a "thunderclap" headache on her L side. Pt has had vision changes to L eye afterwards. No unilateral weakness noted. Pt states she's had multiple episodes of vomiting since. Pt ambulatory to triage.  ?

## 2021-06-07 NOTE — ED Notes (Signed)
Pt unable to tolerate IVF on pump, stating it was "burning".  Fluids taken off pump, running by gravity. ?

## 2021-06-07 NOTE — ED Notes (Signed)
Pt received 559m of NS. ?

## 2021-06-07 NOTE — ED Provider Notes (Addendum)
?[facility] EMERGENCY DEPT ?Provider Note ? ? ?CSN: [id number] ?Arrival date & time: 06/07/21  1614 ? ?  ? ?History ? ?Chief Complaint  ?Patient presents with  ? Headache  ? ? ?Lauren Chapman is a 51 y.o. female. ? ?HPI ? ?51 year old female with past medical history of migraines presents emergency department with concern for sudden onset left-sided headache.  Patient states this started around 10 30-11.  She is being seen approximately 6 hours with ongoing headache.  She describes the headache initially starting in her left parietal region, sudden onset, sharp.  Is now radiating down to her left eye and neck.  She has had associated nausea/vomiting.  More recently now feels like she is getting tunnel vision in the left eye but still has maintained central vision.  Patient states that this is a different pattern than her typical migraines.  It is more acute, more severe and associated with tunnel vision.  Her typical visual changes is more of a "multicolored" light aura.  No recent fever or injury.  No new medications.  Denies any unilateral weakness/numbness. ? ?Home Medications ?Prior to Admission medications   ?Medication Sig Start Date End Date Taking? Authorizing Provider  ?albuterol (VENTOLIN HFA) 108 (90 Base) MCG/ACT inhaler Inhale 2 puffs into the lungs every 4 (four) hours as needed for wheezing or shortness of breath. 02/20/20   Laurin Coder, MD  ?ARIPiprazole (ABILIFY) 30 MG tablet Take 30 mg by mouth at bedtime. 12/23/20   [provider]  ?aspirin-acetaminophen-caffeine (EXCEDRIN MIGRAINE) [phone] MG tablet Take 2 tablets by mouth every 6 (six) hours as needed for headache.    [provider]  ?benzonatate (TESSALON) 100 MG capsule Take 1 capsule (100 mg total) by mouth 3 (three) times daily as needed for cough. ?Patient not taking: Reported on 05/27/2021 12/28/20   Brunetta Jeans, PA-C  ?benztropine (COGENTIN) 0.5 MG tablet Take 0.5 mg by mouth 2 (two) times  daily. 12/23/20   [provider]  ?busPIRone (BUSPAR) 15 MG tablet Take 15 mg by mouth 3 (three) times daily. 07/30/20   [provider]  ?citalopram (CELEXA) 20 MG tablet Take 20 mg by mouth daily.    [provider]  ?EPINEPHrine 0.3 mg/0.3 mL IJ SOAJ injection Inject 0.3 mg into the muscle as needed for anaphylaxis. 03/29/21   Azucena Cecil, PA-C  ?esomeprazole (NEXIUM) 20 MG capsule Take 1 capsule (20 mg total) by mouth 2 (two) times daily before a meal. ?Patient taking differently: Take 20 mg by mouth 2 (two) times daily as needed (indigestion). 03/21/19   Daleen Squibb, MD  ?gabapentin (NEURONTIN) 800 MG tablet Take 800 mg by mouth 3 (three) times daily.  07/02/18   [provider]  ?Galcanezumab-gnlm (EMGALITY) 120 MG/ML SOAJ Inject 120 mg into the skin every 30 (thirty) days. 03/24/20   Marcial Pacas, MD  ?lamoTRIgine (LAMICTAL) 150 MG tablet Take 150 mg by mouth 2 (two) times daily.     [provider]  ?ondansetron (ZOFRAN) 4 MG tablet Take 1 tablet (4 mg total) by mouth every 8 (eight) hours as needed for nausea or vomiting. 01/05/20   Suzzanne Cloud, NP  ?predniSONE (DELTASONE) 10 MG tablet Take 1 tablet (10 mg total) by mouth daily with breakfast. ?Patient not taking: Reported on 05/27/2021 03/14/21   Wendie Agreste, MD  ?SUMAtriptan [facility]) 6 MG/0.5ML SOLN injection Inject 0.5 mLs (6 mg total) into the skin as needed for migraine or headache (May  repeat after 1 hour.  Maximum 2 injections in 24 hours). May repeat in 2 hours if headache persists or recurs. 04/01/21   Pieter Partridge, DO  ?tiZANidine (ZANAFLEX) 4 MG tablet Take 1 tablet (4 mg total) by mouth every 6 (six) hours as needed for muscle spasms. TAKE 1 TABLET BY MOUTH EVERY 6 HOURS AS NEEDED FOR MUSCLE SPASMS. 12/10/20   Wendie Agreste, MD  ?   ? ?Allergies    ?Codeine   ? ?Review of Systems   ?Review of Systems  ?Constitutional:  Negative for fever.  ?Eyes:  Positive for visual disturbance.   ?Respiratory:  Negative for shortness of breath.   ?Cardiovascular:  Negative for chest pain.  ?Gastrointestinal:  Negative for abdominal pain, diarrhea and vomiting.  ?Skin:  Negative for rash.  ?Neurological:  Positive for headaches. Negative for dizziness, syncope, facial asymmetry, speech difficulty, weakness and numbness.  ? ?Physical Exam ?Updated Vital Signs ?BP (!) 107/57   Pulse (!) 57   Temp 98.2 ?F (36.8 ?C)   Resp 11   Ht '5\' 3"'$  (1.6 m)   Wt 82.6 kg   LMP 05/02/2019 Comment: irregular cycles  SpO2 98%   BMI 32.24 kg/m?  ?Physical Exam ?Vitals and nursing note reviewed.  ?Constitutional:   ?   Appearance: Normal appearance.  ?HENT:  ?   Head: Normocephalic.  ?   Mouth/Throat:  ?   Mouth: Mucous membranes are moist.  ?Eyes:  ?   Extraocular Movements: Extraocular movements intact.  ?   Right eye: No nystagmus.  ?   Left eye: No nystagmus.  ?   Pupils: Pupils are equal, round, and reactive to light.  ?   Comments: No injection  ?Cardiovascular:  ?   Rate and Rhythm: Normal rate.  ?Pulmonary:  ?   Effort: Pulmonary effort is normal. No respiratory distress.  ?Abdominal:  ?   Palpations: Abdomen is soft.  ?   Tenderness: There is no abdominal tenderness.  ?Musculoskeletal:  ?   Cervical back: No rigidity.  ?Skin: ?   General: Skin is warm.  ?Neurological:  ?   Mental Status: She is alert and oriented to person, place, and time. Mental status is at baseline.  ?   Cranial Nerves: No cranial nerve deficit or facial asymmetry.  ?Psychiatric:     ?   Mood and Affect: Mood normal.     ?   Speech: Speech normal.  ? ? ?ED Results / Procedures / Treatments   ?Labs ?(all labs ordered are listed, but only abnormal results are displayed) ?Labs Reviewed  ?BASIC METABOLIC PANEL - Abnormal; Notable for the following components:  ?    Result Value  ? Glucose, Bld 103 (*)   ? Creatinine, Ser 1.16 (*)   ? GFR, Estimated 56 (*)   ? All other components within normal limits  ?CBC WITH DIFFERENTIAL/PLATELET  ? ? ?EKG ?EKG  Interpretation ? ?Date/Time:  Tuesday Jun 07 2021 17:03:16 EDT ?Ventricular Rate:  62 ?PR Interval:  146 ?QRS Duration: 91 ?QT Interval:  425 ?QTC Calculation: 432 ?R Axis:   33 ?Text Interpretation: Sinus rhythm RSR' in V1 or V2, right VCD or RVH Similar to previous Confirmed by Lavenia Atlas [phone]) on 06/07/2021 6:24:33 PM ? ?Radiology ?No results found. ? ?Procedures ?Procedures  ? ? ?Medications Ordered in ED ?Medications  ?iohexol (OMNIPAQUE) 350 MG/ML injection 100 mL (75 mLs Intravenous Contrast Given 06/07/21 1851)  ? ? ?ED Course/ Medical Decision Making/ A&P ?  ?                        ?  Medical Decision Making ?Amount and/or Complexity of Data Reviewed ?Labs: ordered. ?Radiology: ordered. ? ?Risk ?Prescription drug management. ? ? ?52 year old female presents the emergency department with sudden onset left-sided headache and left-sided monocular vision changes which she describes as tunnel vision with intact central vision.  No other associated neuro symptoms.  The headache was sudden onset, about 6 hours ago.  In the window for CT head without rule out in regards to [place]. ? ?Blood work is reassuring.  CTA head/neck and CT head shows no bleeding, aneurysm or acute finding.  After migraine cocktail she feels significantly improved however still has ongoing mild headache with the left monocular vision changes.  She continues to describe it as a tunnel vision however the central intact vision field has improved and gotten larger.  With the ongoing monocular symptoms I consulted with neurology.  Spoke with Dr. Rory Percy.  He recommends further hydration and a dose of Compazine.  If this improves her symptoms she may follow-up as an outpatient with what appears to be a complicated migraine.  If the visual deficit persists and there is concern patient can be transferred to [facility] for an MRI.  If that MRI is unremarkable she would be cleared for discharge home. ? ?Medications ordered ? ?On reevaluation patient's  headache has pretty much resolved.  She states that the vision changes in the left eye are almost back to normal.  She has some mild fogginess in the left eye but no vision loss/acute change.  I agree with neurolog

## 2021-06-08 ENCOUNTER — Ambulatory Visit (INDEPENDENT_AMBULATORY_CARE_PROVIDER_SITE_OTHER): Payer: Medicare Other | Admitting: Podiatry

## 2021-06-08 ENCOUNTER — Ambulatory Visit (INDEPENDENT_AMBULATORY_CARE_PROVIDER_SITE_OTHER): Payer: Medicare Other

## 2021-06-08 ENCOUNTER — Encounter: Payer: Self-pay | Admitting: Podiatry

## 2021-06-08 DIAGNOSIS — M205X1 Other deformities of toe(s) (acquired), right foot: Secondary | ICD-10-CM | POA: Diagnosis not present

## 2021-06-08 DIAGNOSIS — M722 Plantar fascial fibromatosis: Secondary | ICD-10-CM

## 2021-06-08 DIAGNOSIS — M7751 Other enthesopathy of right foot: Secondary | ICD-10-CM | POA: Diagnosis not present

## 2021-06-08 LAB — C-REACTIVE PROTEIN: CRP: <0.5 mg/dL (ref <1.0)

## 2021-06-08 MED ORDER — TRIAMCINOLONE ACETONIDE 10 MG/ML IJ SUSP
10.0000 mg | Freq: Once | INTRAMUSCULAR | Status: AC
  Administered 2021-06-08: 10 mg

## 2021-06-08 NOTE — Progress Notes (Signed)
Subjective:  ? ?Patient ID: Lauren Chapman, female   DOB: 51 y.o.   MRN: [id number]  ? ?HPI ?Patient presents with pain around the big toe joint right stating its been hurting for a year or 2.  Also gets pain on the bottom of the toe and in the ball of the foot.  Patient does not smoke likes to be active if possible ? ? ?Review of Systems  ?All other systems reviewed and are negative. ? ? ?   ?Objective:  ?Physical Exam ?Vitals and nursing note reviewed.  ?Constitutional:   ?   Appearance: She is well-developed.  ?Pulmonary:  ?   Effort: Pulmonary effort is normal.  ?Musculoskeletal:     ?   General: Normal range of motion.  ?Skin: ?   General: Skin is warm.  ?Neurological:  ?   Mental Status: She is alert.  ?  ?Neurovascular status intact muscle strength adequate range of motion within normal limits with patient found to have discomfort and swelling around the first MPJ right with fluid buildup around the joint surface.  There is some narrowing of the joint and there is some reduced motion but no crepitus of the joint surface ? ?   ?Assessment:  ?Stability that we are dealing with a hallux limitus inflammatory condition right versus other pathology  ? ?   ?Plan:  ?H&P x-rays reviewed condition discussed at length.  At this point I did go ahead today and I did a periarticular injection of the first MPJ 3 mg Kenalog 5 mg Xylocaine advised on rigid bottom shoes reappoint several weeks to reevaluate and may require more aggressive treatment depending on response with possibility for ultimate osteotomy of the first metatarsal.  Patient will be seen back education rendered today ? ?X-rays indicate the beginnings of structural hallux limitus right moderate narrowness of the joint small spur formation dorsal ?   ? ? ?

## 2021-06-14 ENCOUNTER — Telehealth: Payer: Self-pay | Admitting: Family Medicine

## 2021-06-14 NOTE — Telephone Encounter (Signed)
Left message for patient to call back and schedule Medicare Annual Wellness Visit (AWV).   Please offer to do virtually or by telephone.  Left office number and my jabber #[phone].  Last AWV:03/17/2019  Please schedule at anytime with Nurse Health Advisor.   

## 2021-06-22 ENCOUNTER — Ambulatory Visit (INDEPENDENT_AMBULATORY_CARE_PROVIDER_SITE_OTHER): Payer: Medicare Other | Admitting: Podiatry

## 2021-06-22 ENCOUNTER — Encounter: Payer: Self-pay | Admitting: Podiatry

## 2021-06-22 DIAGNOSIS — M205X1 Other deformities of toe(s) (acquired), right foot: Secondary | ICD-10-CM | POA: Diagnosis not present

## 2021-06-22 DIAGNOSIS — L600 Ingrowing nail: Secondary | ICD-10-CM

## 2021-06-22 NOTE — Patient Instructions (Signed)

## 2021-06-22 NOTE — Progress Notes (Signed)
Subjective:   Patient ID: Lauren Chapman, female   DOB: 51 y.o.   MRN: [id number]   HPI Patient states the big toe joint right is improved with the burning pain resolved but there still is some stiffness and she does have a lot of problems with the second nails of both feet that become thick incurvated and she cannot take care of.  Patient is satisfied to the big toe joint currently with mild discomfort   ROS      Objective:  Physical Exam  Neurovascular status intact with improved range of motion of the big toe joint right that is better than it was with patient found to have thick deformed second nails bilateral that are dystrophic and impossible for her to cut     Assessment:  Improved hallux limitus deformity right but still does have moderate arthritis and damage second nails bilateral     Plan:  H&P reviewed conditions discussed the first MPJ and further treatment in future which may be necessary.  At this point we will get a focus on the nailbeds and I have recommended permanent nail removal I explained the procedures and risk and patient wants surgery.  She signed consent form after review and is this point has each second toe anesthetized 60 mg Xylocaine Marcaine mixture I removed the nails I exposed the root and I applied chemical agent phenol for applications 30 seconds followed by alcohol lavage sterile dressing gave instructions on soaks leave dressing on 24 hours take them off earlier if any throbbing were to occur patient encouraged to call with questions concerns which may arise

## 2021-06-24 ENCOUNTER — Ambulatory Visit (INDEPENDENT_AMBULATORY_CARE_PROVIDER_SITE_OTHER): Payer: Medicare Other

## 2021-06-24 VITALS — BP 100/66 | HR 59 | Temp 97.8°F | Resp 18 | Ht 63.0 in | Wt 188.2 lb

## 2021-06-24 DIAGNOSIS — G43709 Chronic migraine without aura, not intractable, without status migrainosus: Secondary | ICD-10-CM | POA: Diagnosis not present

## 2021-06-24 MED ORDER — SODIUM CHLORIDE 0.9 % IV SOLN
100.0000 mg | Freq: Once | INTRAVENOUS | Status: AC
  Administered 2021-06-24: 100 mg via INTRAVENOUS
  Filled 2021-06-24: qty 1

## 2021-06-24 NOTE — Progress Notes (Signed)
Diagnosis: Migraines  Provider:  Marshell Garfinkel, MD  Procedure: Infusion  IV Type: Peripheral, IV Location: L Antecubital  Vyepti (Eptinezumab-jjmr), Dose: 100 mg  Infusion Start Time: 1100  Infusion Stop Time: 2297  Post Infusion IV Care: Peripheral IV Discontinued  Discharge: Condition: Good, Destination: Home . AVS provided to patient.   Performed by:  Koren Shiver, RN

## 2021-07-18 ENCOUNTER — Emergency Department (HOSPITAL_COMMUNITY): Payer: Medicare Other

## 2021-07-18 ENCOUNTER — Other Ambulatory Visit: Payer: Self-pay

## 2021-07-18 ENCOUNTER — Encounter (HOSPITAL_COMMUNITY): Payer: Self-pay

## 2021-07-18 ENCOUNTER — Encounter: Payer: Medicare Other | Admitting: Physician Assistant

## 2021-07-18 ENCOUNTER — Inpatient Hospital Stay (HOSPITAL_COMMUNITY)
Admission: EM | Admit: 2021-07-18 | Discharge: 2021-07-23 | DRG: 193 | Disposition: A | Discharge status: Home / Self Care | Payer: Medicare Other | Source: Ambulatory Visit | Attending: Internal Medicine | Admitting: Internal Medicine

## 2021-07-18 ENCOUNTER — Ambulatory Visit
Admission: EM | Admit: 2021-07-18 | Discharge: 2021-07-18 | Payer: Medicare Other | Attending: Internal Medicine | Admitting: Internal Medicine

## 2021-07-18 DIAGNOSIS — J9601 Acute respiratory failure with hypoxia: Secondary | ICD-10-CM | POA: Diagnosis present

## 2021-07-18 DIAGNOSIS — F17210 Nicotine dependence, cigarettes, uncomplicated: Secondary | ICD-10-CM | POA: Diagnosis present

## 2021-07-18 DIAGNOSIS — E876 Hypokalemia: Secondary | ICD-10-CM

## 2021-07-18 DIAGNOSIS — J189 Pneumonia, unspecified organism: Secondary | ICD-10-CM | POA: Diagnosis not present

## 2021-07-18 DIAGNOSIS — J441 Chronic obstructive pulmonary disease with (acute) exacerbation: Secondary | ICD-10-CM | POA: Diagnosis present

## 2021-07-18 DIAGNOSIS — K219 Gastro-esophageal reflux disease without esophagitis: Secondary | ICD-10-CM | POA: Diagnosis present

## 2021-07-18 DIAGNOSIS — R0602 Shortness of breath: Principal | ICD-10-CM

## 2021-07-18 DIAGNOSIS — R531 Weakness: Secondary | ICD-10-CM

## 2021-07-18 DIAGNOSIS — Z8049 Family history of malignant neoplasm of other genital organs: Secondary | ICD-10-CM

## 2021-07-18 DIAGNOSIS — J440 Chronic obstructive pulmonary disease with acute lower respiratory infection: Secondary | ICD-10-CM | POA: Diagnosis present

## 2021-07-18 DIAGNOSIS — R062 Wheezing: Secondary | ICD-10-CM

## 2021-07-18 DIAGNOSIS — R051 Acute cough: Secondary | ICD-10-CM

## 2021-07-18 DIAGNOSIS — F319 Bipolar disorder, unspecified: Secondary | ICD-10-CM | POA: Diagnosis present

## 2021-07-18 DIAGNOSIS — G4733 Obstructive sleep apnea (adult) (pediatric): Secondary | ICD-10-CM

## 2021-07-18 DIAGNOSIS — Z823 Family history of stroke: Secondary | ICD-10-CM

## 2021-07-18 DIAGNOSIS — E669 Obesity, unspecified: Secondary | ICD-10-CM | POA: Diagnosis present

## 2021-07-18 DIAGNOSIS — Z6831 Body mass index (BMI) 31.0-31.9, adult: Secondary | ICD-10-CM

## 2021-07-18 DIAGNOSIS — R197 Diarrhea, unspecified: Secondary | ICD-10-CM

## 2021-07-18 DIAGNOSIS — Z885 Allergy status to narcotic agent status: Secondary | ICD-10-CM

## 2021-07-18 DIAGNOSIS — Z1152 Encounter for screening for COVID-19: Secondary | ICD-10-CM

## 2021-07-18 DIAGNOSIS — Z79899 Other long term (current) drug therapy: Secondary | ICD-10-CM

## 2021-07-18 DIAGNOSIS — Z818 Family history of other mental and behavioral disorders: Secondary | ICD-10-CM

## 2021-07-18 DIAGNOSIS — Z91199 Patient's noncompliance with other medical treatment and regimen due to unspecified reason: Secondary | ICD-10-CM

## 2021-07-18 LAB — BASIC METABOLIC PANEL
Anion gap: 9 (ref 5–15)
BUN: 9 mg/dL (ref 6–20)
CO2: 25 mmol/L (ref 22–32)
Calcium: 8.9 mg/dL (ref 8.9–10.3)
Chloride: 106 mmol/L (ref 98–111)
Creatinine, Ser: 1.13 mg/dL — ABNORMAL HIGH (ref 0.44–1.00)
GFR, Estimated: 58 mL/min — ABNORMAL LOW (ref >60)
Glucose, Bld: 157 mg/dL — ABNORMAL HIGH (ref 70–99)
Potassium: 3.2 mmol/L — ABNORMAL LOW (ref 3.5–5.1)
Sodium: 140 mmol/L (ref 135–145)

## 2021-07-18 LAB — CBC WITH DIFFERENTIAL/PLATELET
Abs Immature Granulocytes: 0.03 10*3/uL (ref 0.00–0.07)
Basophils Absolute: 0 10*3/uL (ref 0.0–0.1)
Basophils Relative: 0 %
Eosinophils Absolute: 0.1 10*3/uL (ref 0.0–0.5)
Eosinophils Relative: 1 %
HCT: 40.7 % (ref 36.0–46.0)
Hemoglobin: 13.7 g/dL (ref 12.0–15.0)
Immature Granulocytes: 1 %
Lymphocytes Relative: 16 %
Lymphs Abs: 1 10*3/uL (ref 0.7–4.0)
MCH: 30.8 pg (ref 26.0–34.0)
MCHC: 33.7 g/dL (ref 30.0–36.0)
MCV: 91.5 fL (ref 80.0–100.0)
Monocytes Absolute: 0.2 10*3/uL (ref 0.1–1.0)
Monocytes Relative: 3 %
Neutro Abs: 4.7 10*3/uL (ref 1.7–7.7)
Neutrophils Relative %: 79 %
Platelets: 163 10*3/uL (ref 150–400)
RBC: 4.45 MIL/uL (ref 3.87–5.11)
RDW: 13.2 % (ref 11.5–15.5)
WBC: 6 10*3/uL (ref 4.0–10.5)
nRBC: 0 % (ref 0.0–0.2)

## 2021-07-18 LAB — MAGNESIUM: Magnesium: 1.8 mg/dL (ref 1.7–2.4)

## 2021-07-18 LAB — RESP PANEL BY RT-PCR (FLU A&B, COVID) ARPGX2
Influenza A by PCR: NEGATIVE
Influenza B by PCR: NEGATIVE
SARS Coronavirus 2 by RT PCR: NEGATIVE

## 2021-07-18 LAB — LACTIC ACID, PLASMA
Lactic Acid, Venous: 2.6 mmol/L (ref 0.5–1.9)
Lactic Acid, Venous: 3.5 mmol/L (ref 0.5–1.9)

## 2021-07-18 LAB — STREP PNEUMONIAE URINARY ANTIGEN: Strep Pneumo Urinary Antigen: NEGATIVE

## 2021-07-18 MED ORDER — CITALOPRAM HYDROBROMIDE 20 MG PO TABS
20.0000 mg | ORAL_TABLET | Freq: Every day | ORAL | Status: DC
  Administered 2021-07-19 – 2021-07-23 (×5): 20 mg via ORAL
  Filled 2021-07-18 (×5): qty 1

## 2021-07-18 MED ORDER — PERPHENAZINE 4 MG PO TABS
4.0000 mg | ORAL_TABLET | Freq: Every day | ORAL | Status: DC
  Administered 2021-07-18 – 2021-07-22 (×5): 4 mg via ORAL
  Filled 2021-07-18 (×6): qty 1

## 2021-07-18 MED ORDER — NICOTINE 14 MG/24HR TD PT24
14.0000 mg | MEDICATED_PATCH | Freq: Every day | TRANSDERMAL | Status: DC
  Administered 2021-07-18 – 2021-07-23 (×6): 14 mg via TRANSDERMAL
  Filled 2021-07-18 (×6): qty 1

## 2021-07-18 MED ORDER — METHYLPREDNISOLONE SODIUM SUCC 40 MG IJ SOLR
40.0000 mg | Freq: Two times a day (BID) | INTRAMUSCULAR | Status: DC
  Administered 2021-07-18 – 2021-07-19 (×2): 40 mg via INTRAVENOUS
  Filled 2021-07-18 (×2): qty 1

## 2021-07-18 MED ORDER — PREDNISONE 20 MG PO TABS: 40.0000 mg | ORAL_TABLET | Freq: Every day | ORAL | Status: DC

## 2021-07-18 MED ORDER — ALBUTEROL SULFATE (2.5 MG/3ML) 0.083% IN NEBU
2.5000 mg | INHALATION_SOLUTION | Freq: Once | RESPIRATORY_TRACT | Status: AC
  Administered 2021-07-18: 2.5 mg via RESPIRATORY_TRACT

## 2021-07-18 MED ORDER — GABAPENTIN 400 MG PO CAPS
800.0000 mg | ORAL_CAPSULE | Freq: Three times a day (TID) | ORAL | Status: DC
  Administered 2021-07-18 – 2021-07-23 (×15): 800 mg via ORAL
  Filled 2021-07-18 (×15): qty 2

## 2021-07-18 MED ORDER — IPRATROPIUM-ALBUTEROL 0.5-2.5 (3) MG/3ML IN SOLN
3.0000 mL | Freq: Four times a day (QID) | RESPIRATORY_TRACT | Status: DC
  Administered 2021-07-18 – 2021-07-22 (×15): 3 mL via RESPIRATORY_TRACT
  Filled 2021-07-18 (×15): qty 3

## 2021-07-18 MED ORDER — SODIUM CHLORIDE 0.9 % IV BOLUS
1000.0000 mL | Freq: Once | INTRAVENOUS | Status: AC
  Administered 2021-07-18: 1000 mL via INTRAVENOUS

## 2021-07-18 MED ORDER — INSULIN ASPART 100 UNIT/ML IJ SOLN: 6.0000 [IU] | Freq: Three times a day (TID) | INTRAMUSCULAR | Status: DC

## 2021-07-18 MED ORDER — SODIUM CHLORIDE 0.9 % IV SOLN
2.0000 g | INTRAVENOUS | Status: AC
  Administered 2021-07-19 – 2021-07-23 (×5): 2 g via INTRAVENOUS
  Filled 2021-07-18 (×5): qty 20

## 2021-07-18 MED ORDER — ONDANSETRON HCL 4 MG PO TABS: 4.0000 mg | ORAL_TABLET | Freq: Four times a day (QID) | ORAL | Status: DC | PRN

## 2021-07-18 MED ORDER — MIRABEGRON ER 25 MG PO TB24
25.0000 mg | ORAL_TABLET | Freq: Every day | ORAL | Status: DC
  Administered 2021-07-18 – 2021-07-23 (×6): 25 mg via ORAL
  Filled 2021-07-18 (×6): qty 1

## 2021-07-18 MED ORDER — BENZTROPINE MESYLATE 0.5 MG PO TABS
0.5000 mg | ORAL_TABLET | Freq: Two times a day (BID) | ORAL | Status: DC
  Administered 2021-07-18 – 2021-07-23 (×10): 0.5 mg via ORAL
  Filled 2021-07-18 (×11): qty 1

## 2021-07-18 MED ORDER — ONDANSETRON HCL 4 MG/2ML IJ SOLN: 4.0000 mg | Freq: Four times a day (QID) | INTRAMUSCULAR | Status: DC | PRN

## 2021-07-18 MED ORDER — ACETAMINOPHEN 325 MG PO TABS
650.0000 mg | ORAL_TABLET | Freq: Four times a day (QID) | ORAL | Status: DC | PRN
  Administered 2021-07-23 (×2): 650 mg via ORAL
  Filled 2021-07-18 (×2): qty 2

## 2021-07-18 MED ORDER — ARIPIPRAZOLE 5 MG PO TABS
30.0000 mg | ORAL_TABLET | Freq: Every day | ORAL | Status: DC
  Administered 2021-07-18 – 2021-07-22 (×5): 30 mg via ORAL
  Filled 2021-07-18 (×5): qty 6

## 2021-07-18 MED ORDER — LAMOTRIGINE 25 MG PO TABS
150.0000 mg | ORAL_TABLET | Freq: Two times a day (BID) | ORAL | Status: DC
  Administered 2021-07-18 – 2021-07-23 (×10): 150 mg via ORAL
  Filled 2021-07-18 (×10): qty 2

## 2021-07-18 MED ORDER — ARIPIPRAZOLE 5 MG PO TABS: 30.0000 mg | ORAL_TABLET | Freq: Every day | ORAL | Status: DC

## 2021-07-18 MED ORDER — AZITHROMYCIN 250 MG PO TABS
500.0000 mg | ORAL_TABLET | Freq: Every day | ORAL | Status: AC
  Administered 2021-07-19 – 2021-07-23 (×5): 500 mg via ORAL
  Filled 2021-07-18 (×5): qty 2

## 2021-07-18 MED ORDER — BUSPIRONE HCL 5 MG PO TABS
15.0000 mg | ORAL_TABLET | Freq: Three times a day (TID) | ORAL | Status: DC
  Administered 2021-07-18 – 2021-07-23 (×15): 15 mg via ORAL
  Filled 2021-07-18 (×15): qty 1

## 2021-07-18 MED ORDER — SODIUM CHLORIDE 0.9 % IV SOLN
500.0000 mg | Freq: Once | INTRAVENOUS | Status: AC
  Administered 2021-07-18: 500 mg via INTRAVENOUS
  Filled 2021-07-18: qty 5

## 2021-07-18 MED ORDER — POTASSIUM CHLORIDE CRYS ER 20 MEQ PO TBCR
40.0000 meq | EXTENDED_RELEASE_TABLET | Freq: Every day | ORAL | Status: DC
  Administered 2021-07-18 – 2021-07-22 (×5): 40 meq via ORAL
  Filled 2021-07-18 (×5): qty 2

## 2021-07-18 MED ORDER — SODIUM CHLORIDE 0.9 % IV SOLN
1.0000 g | Freq: Once | INTRAVENOUS | Status: AC
  Administered 2021-07-18: 1 g via INTRAVENOUS
  Filled 2021-07-18: qty 10

## 2021-07-18 MED ORDER — PANTOPRAZOLE SODIUM 40 MG PO TBEC: 40.0000 mg | DELAYED_RELEASE_TABLET | Freq: Every day | ORAL | Status: DC

## 2021-07-18 MED ORDER — PANTOPRAZOLE SODIUM 40 MG PO TBEC
40.0000 mg | DELAYED_RELEASE_TABLET | Freq: Every day | ORAL | Status: DC
  Administered 2021-07-18 – 2021-07-23 (×6): 40 mg via ORAL
  Filled 2021-07-18 (×6): qty 1

## 2021-07-18 MED ORDER — SODIUM CHLORIDE 0.9 % IV SOLN: INTRAVENOUS | Status: DC

## 2021-07-18 MED ORDER — ACETAMINOPHEN 650 MG RE SUPP: 650.0000 mg | Freq: Four times a day (QID) | RECTAL | Status: DC | PRN

## 2021-07-18 MED ORDER — ENOXAPARIN SODIUM 40 MG/0.4ML IJ SOSY
40.0000 mg | PREFILLED_SYRINGE | INTRAMUSCULAR | Status: DC
  Administered 2021-07-18 – 2021-07-22 (×5): 40 mg via SUBCUTANEOUS
  Filled 2021-07-18 (×5): qty 0.4

## 2021-07-18 MED ORDER — GUAIFENESIN ER 600 MG PO TB12
600.0000 mg | ORAL_TABLET | Freq: Two times a day (BID) | ORAL | Status: DC
Start: 2021-07-18 — End: 2021-07-22
  Administered 2021-07-18 – 2021-07-22 (×8): 600 mg via ORAL
  Filled 2021-07-18 (×8): qty 1

## 2021-07-18 MED ORDER — TIZANIDINE HCL 4 MG PO TABS: 4.0000 mg | ORAL_TABLET | Freq: Four times a day (QID) | ORAL | Status: DC | PRN

## 2021-07-18 NOTE — ED Provider Notes (Signed)
[facility]-EMERGENCY DEPT Provider Note   CSN: [id number] Arrival date & time: 07/18/21  1117     History  Chief Complaint  Patient presents with   Shortness of Breath    Lauren Chapman is a 51 y.o. female.  51 year old female with prior medical history as detailed below presents for evaluation.  Patient reports approximately 3 days of worsening cough congestion.  Patient reports increased wheezing.  Patient with history of COPD.  Patient reports use of breathing treatments at home was unsuccessful at relieving symptoms  Patient seen by urgent care today.  She was noted to be hypoxic into the low 90s upper 80s.  She was referred to the ED.  Patient reportedly went home after her urgent care evaluation and then decided to come to the ED by EMS.  EMS administered Solu-Medrol 125 mg while en route to the ED.  The history is provided by the patient and medical records.  Shortness of Breath Severity:  Moderate Onset quality:  Gradual Duration:  3 days Timing:  Rare Progression:  Worsening Chronicity:  New      Home Medications Prior to Admission medications   Medication Sig Start Date End Date Taking? Authorizing Provider  albuterol (VENTOLIN HFA) 108 (90 Base) MCG/ACT inhaler Inhale 2 puffs into the lungs every 4 (four) hours as needed for wheezing or shortness of breath. 02/20/20   Olalere, Onnie Boer A, MD  ARIPiprazole (ABILIFY) 30 MG tablet Take 30 mg by mouth at bedtime. 12/23/20   [provider]  benztropine (COGENTIN) 0.5 MG tablet Take 0.5 mg by mouth 2 (two) times daily. 12/23/20   [provider]  busPIRone (BUSPAR) 15 MG tablet Take 15 mg by mouth 3 (three) times daily. 07/30/20   [provider]  citalopram (CELEXA) 20 MG tablet Take 20 mg by mouth daily.    [provider]  EPINEPHrine 0.3 mg/0.3 mL IJ SOAJ injection Inject 0.3 mg into the muscle as needed for anaphylaxis. 03/29/21   Al Decant, PA-C   esomeprazole (NEXIUM) 20 MG capsule Take 1 capsule (20 mg total) by mouth 2 (two) times daily before a meal. Patient taking differently: Take 20 mg by mouth 2 (two) times daily as needed (indigestion). 03/21/19   Noni Saupe, MD  gabapentin (NEURONTIN) 800 MG tablet Take 800 mg by mouth 3 (three) times daily.  07/02/18   [provider]  lamoTRIgine (LAMICTAL) 150 MG tablet Take 150 mg by mouth 2 (two) times daily.     [provider]  ondansetron (ZOFRAN) 4 MG tablet Take 1 tablet (4 mg total) by mouth every 8 (eight) hours as needed for nausea or vomiting. 01/05/20   Glean Salvo, NP  tiZANidine (ZANAFLEX) 4 MG tablet Take 1 tablet (4 mg total) by mouth every 6 (six) hours as needed for muscle spasms. TAKE 1 TABLET BY MOUTH EVERY 6 HOURS AS NEEDED FOR MUSCLE SPASMS. 12/10/20   Shade Flood, MD      Allergies    Codeine    Review of Systems   Review of Systems  Respiratory:  Positive for shortness of breath.   All other systems reviewed and are negative.   Physical Exam Updated Vital Signs BP 119/87   Pulse (!) 101   Temp 97.7 F (36.5 C) (Oral)   Resp 20   Ht 5\' 3"  (1.6 m)   Wt 81.6 kg   LMP 05/02/2019 Comment: irregular cycles  SpO2 92%   BMI  31.89 kg/m  Physical Exam Vitals and nursing note reviewed.  Constitutional:      General: She is not in acute distress.    Appearance: Normal appearance. She is well-developed.  HENT:     Head: Normocephalic and atraumatic.  Eyes:     Conjunctiva/sclera: Conjunctivae normal.     Pupils: Pupils are equal, round, and reactive to light.  Cardiovascular:     Rate and Rhythm: Normal rate and regular rhythm.     Heart sounds: Normal heart sounds.  Pulmonary:     Effort: Pulmonary effort is normal. No respiratory distress.     Comments: Diffuse expiratory wheezes in all lung fields Abdominal:     General: There is no distension.     Palpations: Abdomen is soft.     Tenderness: There is no abdominal  tenderness.  Musculoskeletal:        General: No deformity. Normal range of motion.     Cervical back: Normal range of motion and neck supple.  Skin:    General: Skin is warm and dry.  Neurological:     General: No focal deficit present.     Mental Status: She is alert and oriented to person, place, and time.     ED Results / Procedures / Treatments   Labs (all labs ordered are listed, but only abnormal results are displayed) Labs Reviewed  RESP PANEL BY RT-PCR (FLU A&B, COVID) [id number]  CULTURE, BLOOD (ROUTINE X 2)  CULTURE, BLOOD (ROUTINE X 2)  CBC WITH DIFFERENTIAL/PLATELET  BASIC METABOLIC PANEL  LACTIC ACID, PLASMA  LACTIC ACID, PLASMA    EKG EKG Interpretation  Date/Time:  Monday July 18 2021 11:40:59 EDT Ventricular Rate:  102 PR Interval:  135 QRS Duration: 88 QT Interval:  354 QTC Calculation: 462 R Axis:   40 Text Interpretation: Sinus tachycardia Probable left atrial enlargement RSR' in V1 or V2, right VCD or RVH Baseline wander in lead(s) V6 Confirmed by Kristine Royal [id number]) on 07/18/2021 11:45:21 AM  Radiology DG Chest Port 1 View  Result Date: 07/18/2021 CLINICAL DATA:  Shortness of breath and cough worsening over the last 4 days. EXAM: PORTABLE CHEST 1 VIEW COMPARISON:  08/25/2018 FINDINGS: Upper lungs remain clear. Question slight increased density in the lingula that could go along with mild lingular pneumonia. No advanced finding. No effusion. IMPRESSION: Question mild lingular pneumonia. Consider two-view chest radiography. Electronically Signed   By: Paulina Fusi M.D.   On: 07/18/2021 11:59    Procedures Procedures    Medications Ordered in ED Medications - No data to display  ED Course/ Medical Decision Making/ A&P                           Medical Decision Making Amount and/or Complexity of Data Reviewed Labs: ordered. Radiology: ordered.    Medical Screen Complete  This patient presented to the ED with complaint of cough, congestion,  shortness of breath, wheezing.  This complaint involves an extensive number of treatment options. The initial differential diagnosis includes, but is not limited to, COPD exacerbation, pneumonia, metabolic abnormality, etc.  This presentation is: Acute, Self-Limited, Previously Undiagnosed, Uncertain Prognosis, Complicated, Systemic Symptoms, and Threat to Life/Bodily Function  Patient with longstanding history of COPD Zentz with O2 requirement and increased wheezing/bronchospasm.  Patient will require admission.  Chest x-ray is suggestive of possible lingular pneumonia.  In addition to steroids already administered by EMS, antibiotics initiated here in the ED.  Hospitalist service  made aware of case and will evaluate for admission.  Additional history obtained:  External records from outside sources obtained and reviewed including prior ED visits and prior Inpatient records.    Lab Tests:  I ordered and personally interpreted labs.  The pertinent results include: CBC, COVID, flu, BMP, lactic acid, blood cultures   Imaging Studies ordered:  I ordered imaging studies including chest x-ray I independently visualized and interpreted obtained imaging which showed possible lingular pneumonia I agree with the radiologist interpretation.   Cardiac Monitoring:  The patient was maintained on a cardiac monitor.  I personally viewed and interpreted the cardiac monitor which showed an underlying rhythm of: NSR   Medicines ordered:  I ordered medication including antibiotics for possible pneumonia Reevaluation of the patient after these medicines showed that the patient: stayed the same  Problem List / ED Course:  Shortness of breath, wheezing, hypoxia, possible CAP   Reevaluation:  After the interventions noted above, I reevaluated the patient and found that they have: improved  Disposition:  After consideration of the diagnostic results and the patients response to  treatment, I feel that the patent would benefit from admission.          Final Clinical Impression(s) / ED Diagnoses Final diagnoses:  SOB (shortness of breath)    Rx / DC Orders ED Discharge Orders     None         Wynetta Fines, MD 07/18/21 1303

## 2021-07-18 NOTE — H&P (Signed)
History and Physical    Patient: Lauren Chapman YQM:[id number] DOB: 1971-01-03 DOA: 07/18/2021 DOS: the patient was seen and examined on 07/18/2021 PCP: Shade Flood, MD  Patient coming from: Home  Chief Complaint:  Chief Complaint  Patient presents with   Shortness of Breath   HPI: Lauren Chapman is a 51 y.o. female with medical history significant of COPD, tobacco abuse, bipolar d/o. Presenting with shortness of breath. She reports that she started feeling "off" 3 days ago. She had a dry cough that was brewing. She went to work 2 days ago, and her cough got worse. She started having chills and aches. She took some OTC cough meds and increased albuterol inhaler. However, she did not feel much better. Yesterday, it was more of the same. She woke this morning, and decided to go to urgent care. They recommended that she come to the ED for eval, but she went home. When her symptoms did not improve; she decided to come to the ED. She denies any other aggravating or alleviating factors.   Review of Systems: As mentioned in the history of present illness. All other systems reviewed and are negative. Past Medical History:  Diagnosis Date   Allergy    Anxiety    Bipolar affective psychosis (HCC)    Complication of anesthesia    " Acting silly- waving at everyone"-Giddy   Depression    Emphysema lung (HCC)    inhaler prn,  followed by pcp and pulmonologist-- dr [name]   Family history of adverse reaction to anesthesia    Father had rheumatic fever, and died on operating table.   GERD (gastroesophageal reflux disease)    Kidney cysts 01/24/2020   Right Kidney   Migraine    Smokers' cough (HCC)    per pt not productive   SUI (stress urinary incontinence, female)    Tubular adenoma of colon 07/08/2020   Wears glasses    Past Surgical History:  Procedure Laterality Date   ABDOMINAL HYSTERECTOMY N/A    Phreesia 11/30/2019   CARPAL TUNNEL RELEASE Right 09-12-2007   @[email or web address]    and  GANGLION CYST EXCISION   COLONOSCOPY     CYSTOSCOPY N/A 04/29/2019   Procedure: CYSTOSCOPY;  Surgeon: [name], MD;  Location: [facility];  Service: Gynecology;  Laterality: N/A;   DILATATION & CURETTAGE/HYSTEROSCOPY WITH MYOSURE N/A 11/13/2018   Procedure: DILATATION & CURETTAGE/HYSTEROSCOPY WITH MYOSURE;  Surgeon: [name], MD;  Location: [facility];  Service: Gynecology;  Laterality: N/A;  request 9:00am OR start time in [place] [facility] block requests one hour   DILATION AND CURETTAGE OF UTERUS  02/2019   EYE SURGERY N/A    Phreesia 11/30/2019   GANGLION CYST EXCISION     R wrist   KNEE ARTHROSCOPY WITH ANTERIOR CRUCIATE LIGAMENT (ACL) REPAIR Right 10/2015   LAPAROSCOPIC CHOLECYSTECTOMY  1997   LAPAROSCOPIC HYSTERECTOMY Bilateral 04/29/2019   Procedure: HYSTERECTOMY TOTAL LAPAROSCOPIC, BILATERAL SALPINO-OOPHORECTOMY;  Surgeon: [name], MD;  Location: [facility];  Service: Gynecology;  Laterality: Bilateral;   TOOTH EXTRACTION  04/01/2019   tooth removal Bilateral 2022   4 teeth removed   UPPER GASTROINTESTINAL ENDOSCOPY     Social History:  reports that she has been smoking cigarettes. She started smoking about 39 years ago. She has a 33.00 pack-year smoking history. She has never used smokeless tobacco. She reports that she does not drink alcohol and does not use drugs.  Allergies  Allergen Reactions  Codeine Anaphylaxis    Anything with codeine    Family History  Problem Relation Age of Onset   Depression Mother    Cancer Mother        Cervical   Alcohol abuse Father    Crohn's disease Sister    Stroke Brother 45   Migraines Daughter    GER disease Daughter    Depression Daughter    Migraines Daughter    GER disease Daughter    Colon cancer Neg Hx    Rectal cancer Neg Hx    Stomach cancer Neg Hx     Prior to Admission medications   Medication Sig Start Date End Date Taking? Authorizing  Provider  albuterol (VENTOLIN HFA) 108 (90 Base) MCG/ACT inhaler Inhale 2 puffs into the lungs every 4 (four) hours as needed for wheezing or shortness of breath. 02/20/20   Olalere, Onnie Boer A, MD  ARIPiprazole (ABILIFY) 30 MG tablet Take 30 mg by mouth at bedtime. 12/23/20   [provider]  benztropine (COGENTIN) 0.5 MG tablet Take 0.5 mg by mouth 2 (two) times daily. 12/23/20   [provider]  busPIRone (BUSPAR) 15 MG tablet Take 15 mg by mouth 3 (three) times daily. 07/30/20   [provider]  citalopram (CELEXA) 20 MG tablet Take 20 mg by mouth daily.    [provider]  EPINEPHrine 0.3 mg/0.3 mL IJ SOAJ injection Inject 0.3 mg into the muscle as needed for anaphylaxis. 03/29/21   Al Decant, PA-C  esomeprazole (NEXIUM) 20 MG capsule Take 1 capsule (20 mg total) by mouth 2 (two) times daily before a meal. Patient taking differently: Take 20 mg by mouth 2 (two) times daily as needed (indigestion). 03/21/19   Noni Saupe, MD  gabapentin (NEURONTIN) 800 MG tablet Take 800 mg by mouth 3 (three) times daily.  07/02/18   [provider]  lamoTRIgine (LAMICTAL) 150 MG tablet Take 150 mg by mouth 2 (two) times daily.     [provider]  ondansetron (ZOFRAN) 4 MG tablet Take 1 tablet (4 mg total) by mouth every 8 (eight) hours as needed for nausea or vomiting. 01/05/20   Glean Salvo, NP  tiZANidine (ZANAFLEX) 4 MG tablet Take 1 tablet (4 mg total) by mouth every 6 (six) hours as needed for muscle spasms. TAKE 1 TABLET BY MOUTH EVERY 6 HOURS AS NEEDED FOR MUSCLE SPASMS. 12/10/20   Shade Flood, MD    Physical Exam: Vitals:   07/18/21 1211 07/18/21 1215 07/18/21 1230 07/18/21 1245  BP:  (!) 108/92 119/87 118/77  Pulse: 88 89 (!) 101 76  Resp: 16 15 20  (!) 21  Temp:      TempSrc:      SpO2: 94% 95% 92% 92%  Weight:      Height:       General: 51 y.o. female resting in bed in NAD Eyes: PERRL, normal sclera ENMT: Nares  patent w/o discharge, orophaynx clear, dentition normal, ears w/o discharge/lesions/ulcers Neck: Supple, trachea midline Cardiovascular: RRR, +S1, S2, no m/g/r, equal pulses throughout Respiratory: diffuse wheeze, no r/r, slightly increased WOB on 4L GI: BS+, NDNT, no masses noted, no organomegaly noted MSK: No e/c/c Skin: No rashes, bruises, ulcerations noted Neuro: A&O x 3, no focal deficits Psyc: Appropriate interaction and affect, calm/cooperative  Data Reviewed:  Na+  140 K+  3.2 Glucose  157 SCr  1.13 WBC  6.0 Hgb  13.7 Plt 163  CXR: Question mild lingular pneumonia. Consider  two-view chest radiography  Assessment and Plan: COPD Exacerbation CAP     - place in obs, med-surg     - CXR as above     - continue CAP coverage     - nebs, steroids, guaifenesin     - wean O2 as able  Tobacco abuse     - counsel against further use     - nicotine patch PRN   Bipolar disorder     - continue home regimen when confirmed  GERD     - PPI  OSA     - CPAP qHS  Hypokalemia     - replace K+; check Mg2+  Advance Care Planning:   Code Status: FULL  Consults: None  Family Communication: w/ mother at bedside  Severity of Illness: The appropriate patient status for this patient is OBSERVATION. Observation status is judged to be reasonable and necessary in order to provide the required intensity of service to ensure the patient's safety. The patient's presenting symptoms, physical exam findings, and initial radiographic and laboratory data in the context of their medical condition is felt to place them at decreased risk for further clinical deterioration. Furthermore, it is anticipated that the patient will be medically stable for discharge from the hospital within 2 midnights of admission.   Author: Teddy Spike, DO 07/18/2021 1:22 PM  For on call review [email or web address].

## 2021-07-18 NOTE — ED Triage Notes (Signed)
Pt coming from home via EMS with c/o SOB and dry cough since Friday. Pt also c/o a headache at this time. Pt went to UC this AM and they suggested she come to the hospital to be assessed. Per EMS, expiratory wheezing heard throughout all lung areas. Hx COPD.  125 solumedrol Albuterol 5 mg DuoNeb x2

## 2021-07-19 DIAGNOSIS — J440 Chronic obstructive pulmonary disease with acute lower respiratory infection: Secondary | ICD-10-CM | POA: Diagnosis present

## 2021-07-19 DIAGNOSIS — F3170 Bipolar disorder, currently in remission, most recent episode unspecified: Secondary | ICD-10-CM | POA: Diagnosis not present

## 2021-07-19 DIAGNOSIS — Z79899 Other long term (current) drug therapy: Secondary | ICD-10-CM | POA: Diagnosis not present

## 2021-07-19 DIAGNOSIS — K219 Gastro-esophageal reflux disease without esophagitis: Secondary | ICD-10-CM | POA: Diagnosis present

## 2021-07-19 DIAGNOSIS — Z885 Allergy status to narcotic agent status: Secondary | ICD-10-CM | POA: Diagnosis not present

## 2021-07-19 DIAGNOSIS — J189 Pneumonia, unspecified organism: Secondary | ICD-10-CM | POA: Diagnosis present

## 2021-07-19 DIAGNOSIS — Z91199 Patient's noncompliance with other medical treatment and regimen due to unspecified reason: Secondary | ICD-10-CM | POA: Diagnosis not present

## 2021-07-19 DIAGNOSIS — Z6831 Body mass index (BMI) 31.0-31.9, adult: Secondary | ICD-10-CM | POA: Diagnosis not present

## 2021-07-19 DIAGNOSIS — E876 Hypokalemia: Secondary | ICD-10-CM | POA: Diagnosis present

## 2021-07-19 DIAGNOSIS — Z1152 Encounter for screening for COVID-19: Secondary | ICD-10-CM | POA: Diagnosis not present

## 2021-07-19 DIAGNOSIS — R0602 Shortness of breath: Secondary | ICD-10-CM | POA: Diagnosis present

## 2021-07-19 DIAGNOSIS — E669 Obesity, unspecified: Secondary | ICD-10-CM | POA: Diagnosis present

## 2021-07-19 DIAGNOSIS — Z8049 Family history of malignant neoplasm of other genital organs: Secondary | ICD-10-CM | POA: Diagnosis not present

## 2021-07-19 DIAGNOSIS — G4733 Obstructive sleep apnea (adult) (pediatric): Secondary | ICD-10-CM | POA: Diagnosis present

## 2021-07-19 DIAGNOSIS — F319 Bipolar disorder, unspecified: Secondary | ICD-10-CM | POA: Diagnosis present

## 2021-07-19 DIAGNOSIS — J9601 Acute respiratory failure with hypoxia: Secondary | ICD-10-CM | POA: Diagnosis present

## 2021-07-19 DIAGNOSIS — Z818 Family history of other mental and behavioral disorders: Secondary | ICD-10-CM | POA: Diagnosis not present

## 2021-07-19 DIAGNOSIS — Z823 Family history of stroke: Secondary | ICD-10-CM | POA: Diagnosis not present

## 2021-07-19 DIAGNOSIS — J441 Chronic obstructive pulmonary disease with (acute) exacerbation: Secondary | ICD-10-CM | POA: Diagnosis present

## 2021-07-19 DIAGNOSIS — F17210 Nicotine dependence, cigarettes, uncomplicated: Secondary | ICD-10-CM | POA: Diagnosis present

## 2021-07-19 LAB — COMPREHENSIVE METABOLIC PANEL
ALT: 22 U/L (ref 0–44)
AST: 19 U/L (ref 15–41)
Albumin: 3.8 g/dL (ref 3.5–5.0)
Alkaline Phosphatase: 76 U/L (ref 38–126)
Anion gap: 9 (ref 5–15)
BUN: 11 mg/dL (ref 6–20)
CO2: 25 mmol/L (ref 22–32)
Calcium: 9.5 mg/dL (ref 8.9–10.3)
Chloride: 110 mmol/L (ref 98–111)
Creatinine, Ser: 0.88 mg/dL (ref 0.44–1.00)
GFR, Estimated: >60 mL/min (ref >60)
Glucose, Bld: 166 mg/dL — ABNORMAL HIGH (ref 70–99)
Potassium: 4.7 mmol/L (ref 3.5–5.1)
Sodium: 144 mmol/L (ref 135–145)
Total Bilirubin: 0.5 mg/dL (ref 0.3–1.2)
Total Protein: 7.3 g/dL (ref 6.5–8.1)

## 2021-07-19 LAB — BLOOD GAS, ARTERIAL
Acid-base deficit: 1.7 mmol/L (ref 0.0–2.0)
Bicarbonate: 23 mmol/L (ref 20.0–28.0)
Drawn by: 29503
FIO2: 36 %
O2 Content: 4 L/min
O2 Saturation: 96.3 %
Patient temperature: 37
pCO2 arterial: 38 mmHg (ref 32–48)
pH, Arterial: 7.39 (ref 7.35–7.45)
pO2, Arterial: 95 mmHg (ref 83–108)

## 2021-07-19 LAB — CBC
HCT: 40.9 % (ref 36.0–46.0)
Hemoglobin: 13.7 g/dL (ref 12.0–15.0)
MCH: 31.3 pg (ref 26.0–34.0)
MCHC: 33.5 g/dL (ref 30.0–36.0)
MCV: 93.4 fL (ref 80.0–100.0)
Platelets: 202 10*3/uL (ref 150–400)
RBC: 4.38 MIL/uL (ref 3.87–5.11)
RDW: 13.5 % (ref 11.5–15.5)
WBC: 13.5 10*3/uL — ABNORMAL HIGH (ref 4.0–10.5)
nRBC: 0 % (ref 0.0–0.2)

## 2021-07-19 LAB — HIV ANTIBODY (ROUTINE TESTING W REFLEX): HIV Screen 4th Generation wRfx: NONREACTIVE

## 2021-07-19 MED ORDER — SODIUM CHLORIDE 0.9 % IV BOLUS
500.0000 mL | Freq: Once | INTRAVENOUS | Status: AC
  Administered 2021-07-19: 500 mL via INTRAVENOUS

## 2021-07-19 MED ORDER — BUDESONIDE 0.5 MG/2ML IN SUSP
0.5000 mg | Freq: Two times a day (BID) | RESPIRATORY_TRACT | Status: DC
  Administered 2021-07-19 – 2021-07-23 (×9): 0.5 mg via RESPIRATORY_TRACT
  Filled 2021-07-19 (×9): qty 2

## 2021-07-19 MED ORDER — ALBUTEROL SULFATE (2.5 MG/3ML) 0.083% IN NEBU
2.5000 mg | INHALATION_SOLUTION | RESPIRATORY_TRACT | Status: DC | PRN
  Administered 2021-07-19 – 2021-07-22 (×6): 2.5 mg via RESPIRATORY_TRACT
  Filled 2021-07-19 (×7): qty 3

## 2021-07-19 MED ORDER — GUAIFENESIN 100 MG/5ML PO LIQD
5.0000 mL | ORAL | Status: DC | PRN
Start: 2021-07-19 — End: 2021-07-22
  Administered 2021-07-19 – 2021-07-22 (×7): 5 mL via ORAL
  Filled 2021-07-19 (×7): qty 10

## 2021-07-19 MED ORDER — METHYLPREDNISOLONE SODIUM SUCC 40 MG IJ SOLR
40.0000 mg | Freq: Two times a day (BID) | INTRAMUSCULAR | Status: DC
  Administered 2021-07-19 – 2021-07-20 (×2): 40 mg via INTRAVENOUS
  Filled 2021-07-19 (×2): qty 1

## 2021-07-20 ENCOUNTER — Encounter (HOSPITAL_COMMUNITY): Payer: Self-pay | Admitting: Internal Medicine

## 2021-07-20 DIAGNOSIS — E876 Hypokalemia: Secondary | ICD-10-CM | POA: Diagnosis not present

## 2021-07-20 DIAGNOSIS — G4733 Obstructive sleep apnea (adult) (pediatric): Secondary | ICD-10-CM

## 2021-07-20 DIAGNOSIS — F319 Bipolar disorder, unspecified: Secondary | ICD-10-CM

## 2021-07-20 DIAGNOSIS — J189 Pneumonia, unspecified organism: Secondary | ICD-10-CM | POA: Diagnosis not present

## 2021-07-20 DIAGNOSIS — J441 Chronic obstructive pulmonary disease with (acute) exacerbation: Secondary | ICD-10-CM | POA: Diagnosis not present

## 2021-07-20 LAB — CBC
HCT: 39.5 % (ref 36.0–46.0)
Hemoglobin: 12.9 g/dL (ref 12.0–15.0)
MCH: 31.1 pg (ref 26.0–34.0)
MCHC: 32.7 g/dL (ref 30.0–36.0)
MCV: 95.2 fL (ref 80.0–100.0)
Platelets: 202 10*3/uL (ref 150–400)
RBC: 4.15 MIL/uL (ref 3.87–5.11)
RDW: 13.6 % (ref 11.5–15.5)
WBC: 14.7 10*3/uL — ABNORMAL HIGH (ref 4.0–10.5)
nRBC: 0 % (ref 0.0–0.2)

## 2021-07-20 LAB — BASIC METABOLIC PANEL
Anion gap: 10 (ref 5–15)
BUN: 15 mg/dL (ref 6–20)
CO2: 24 mmol/L (ref 22–32)
Calcium: 9.5 mg/dL (ref 8.9–10.3)
Chloride: 107 mmol/L (ref 98–111)
Creatinine, Ser: 0.94 mg/dL (ref 0.44–1.00)
GFR, Estimated: >60 mL/min (ref >60)
Glucose, Bld: 161 mg/dL — ABNORMAL HIGH (ref 70–99)
Potassium: 4.1 mmol/L (ref 3.5–5.1)
Sodium: 141 mmol/L (ref 135–145)

## 2021-07-20 LAB — LEGIONELLA PNEUMOPHILA SEROGP 1 UR AG: L. pneumophila Serogp 1 Ur Ag: NEGATIVE

## 2021-07-20 MED ORDER — METHYLPREDNISOLONE SODIUM SUCC 125 MG IJ SOLR
80.0000 mg | Freq: Two times a day (BID) | INTRAMUSCULAR | Status: DC
  Administered 2021-07-20 – 2021-07-21 (×2): 80 mg via INTRAVENOUS
  Filled 2021-07-20 (×2): qty 2

## 2021-07-20 NOTE — Progress Notes (Signed)
  Transition of Care [facility]) Screening Note   Patient Details  Name: Lauren Chapman Date of Birth: 26-Nov-1970   [facility]) CM/SW Contact:    Vassie Moselle, LCSW Phone Number: 07/20/2021, 12:35 PM    Transition of Care Department [facility]) has reviewed patient and no TOC needs have been identified at this time. We will continue to monitor patient advancement through interdisciplinary progression rounds. If new patient transition needs arise, please place a TOC consult.

## 2021-07-20 NOTE — Progress Notes (Signed)
Pt refused cpap tonight, prefers to wear nasal cannula instead.  Pt was encouraged to call should she change her mind.

## 2021-07-20 NOTE — Progress Notes (Signed)
PROGRESS NOTE    Lauren Chapman  NGE:[id number] DOB: 1970-03-22 DOA: 07/18/2021 PCP: Wendie Agreste, MD   Brief Narrative:  51 y.o. female with medical history significant of COPD, tobacco abuse, bipolar disorder presented with worsening shortness of breath.  She was admitted for COPD exacerbation, pneumonia and acute hypoxic respiratory failure and started on antibiotics and steroids.  Assessment & Plan:   Acute hypoxic respiratory failure COPD exacerbation Community-acquired pneumonia Ongoing tobacco use -Influenza and COVID-19 negative on presentation.  Strep pneumo antigen in the urine was negative.  Currently on Rocephin and Zithromax along with IV Solu-Medrol.  Does not feel well and is significantly wheezing.  Not ready for discharge yet.  Continue current nebs.  Increase Solu-Medrol to 80 mg IV every 12 hours. -Currently on 3 L oxygen via nasal cannula.  Wean off as able.  Does not wear oxygen at home. -Outpatient follow-up with pulmonary -Counseled regarding tobacco cessation.  Continue nicotine patch  Hypokalemia -Resolved  Bipolar disorder -Noted to be on citalopram, gabapentin, Lamictal, Abilify, Cogentin, BuSpar perphenazine.   GERD -Continue PPI  Obstructive sleep apnea -Continue CPAP  Obesity -Outpatient follow-up  DVT prophylaxis: Lovenox Code Status: Full Family Communication: None at bedside Disposition Plan: Status is: Inpatient Remains inpatient appropriate because: Of severity of illness.  Still with significant wheezing.    Consultants: None  Procedures: None  Antimicrobials: Rocephin and Zithromax from 07/18/2021 onwards   Subjective: Patient seen and examined at bedside.  Still complains of significant wheezing and does not feel ready to go home yet.  No overnight fever or vomiting reported.  Objective: Vitals:   07/19/21 2015 07/20/21 0216 07/20/21 0609 07/20/21 0813  BP: 122/61  (!) 105/56   Pulse: 75  77   Resp: 18  18   Temp:  98 F (36.7 C)  (!) 97.3 F (36.3 C)   TempSrc: Oral  Oral   SpO2: 94% 97% 93% 93%  Weight:      Height:        Intake/Output Summary (Last 24 hours) at 07/20/2021 1020 Last data filed at 07/20/2021 0500 Gross per 24 hour  Intake 1143.33 ml  Output 200 ml  Net 943.33 ml   Filed Weights   07/18/21 1142  Weight: 81.6 kg    Examination:  General exam: Appears calm and comfortable.  Currently on 3 L oxygen via nasal cannula. Respiratory system: Bilateral decreased breath sounds at bases with significant wheezing scattered. Cardiovascular system: S1 & S2 heard, Rate controlled Gastrointestinal system: Abdomen is obese, nondistended, soft and nontender. Normal bowel sounds heard. Extremities: No cyanosis, clubbing; trace lower extremity edema present  Central nervous system: Alert and oriented. No focal neurological deficits. Moving extremities Skin: No rashes, lesions or ulcers Psychiatry: Looks intermittently anxious.  No signs of agitation.   Data Reviewed: I have personally reviewed following labs and imaging studies  CBC: Recent Labs  Lab 07/18/21 1138 07/19/21 0508 07/20/21 0525  WBC 6.0 13.5* 14.7*  NEUTROABS 4.7  --   --   HGB 13.7 13.7 12.9  HCT 40.7 40.9 39.5  MCV 91.5 93.4 95.2  PLT 163 202 401   Basic Metabolic Panel: Recent Labs  Lab 07/18/21 1138 07/19/21 0508 07/20/21 0525  NA 140 144 141  K 3.2* 4.7 4.1  CL 106 110 107  CO2 '25 25 24  '$ GLUCOSE 157* 166* 161*  BUN '9 11 15  '$ CREATININE 1.13* 0.88 0.94  CALCIUM 8.9 9.5 9.5  MG 1.8  --   --  GFR: Estimated Creatinine Clearance: 70 mL/min (by C-G formula based on SCr of 0.94 mg/dL). Liver Function Tests: Recent Labs  Lab 07/19/21 0508  AST 19  ALT 22  ALKPHOS 76  BILITOT 0.5  PROT 7.3  ALBUMIN 3.8   No results for input(s): "LIPASE", "AMYLASE" in the last 168 hours. No results for input(s): "AMMONIA" in the last 168 hours. Coagulation Profile: No results for input(s): "INR", "PROTIME"  in the last 168 hours. Cardiac Enzymes: No results for input(s): "CKTOTAL", "CKMB", "CKMBINDEX", "TROPONINI" in the last 168 hours. BNP (last 3 results) No results for input(s): "PROBNP" in the last 8760 hours. HbA1C: No results for input(s): "HGBA1C" in the last 72 hours. CBG: No results for input(s): "GLUCAP" in the last 168 hours. Lipid Profile: No results for input(s): "CHOL", "HDL", "LDLCALC", "TRIG", "CHOLHDL", "LDLDIRECT" in the last 72 hours. Thyroid Function Tests: No results for input(s): "TSH", "T4TOTAL", "FREET4", "T3FREE", "THYROIDAB" in the last 72 hours. Anemia Panel: No results for input(s): "VITAMINB12", "FOLATE", "FERRITIN", "TIBC", "IRON", "RETICCTPCT" in the last 72 hours. Sepsis Labs: Recent Labs  Lab 07/18/21 1249 07/18/21 1527  LATICACIDVEN 2.6* 3.5*    Recent Results (from the past 240 hour(s))  Resp Panel by RT-PCR (Flu A&B, Covid) Anterior Nasal Swab     Status: None   Collection Time: 07/18/21 11:39 AM   Specimen: Anterior Nasal Swab  Result Value Ref Range Status   SARS Coronavirus 2 by RT PCR NEGATIVE NEGATIVE Final    Comment: (NOTE) SARS-CoV-2 target nucleic acids are NOT DETECTED.  The SARS-CoV-2 RNA is generally detectable in upper respiratory specimens during the acute phase of infection. The lowest concentration of SARS-CoV-2 viral copies this assay can detect is 138 copies/mL. A negative result does not preclude SARS-Cov-2 infection and should not be used as the sole basis for treatment or other patient management decisions. A negative result may occur with  improper specimen collection/handling, submission of specimen other than nasopharyngeal swab, presence of viral mutation(s) within the areas targeted by this assay, and inadequate number of viral copies(<138 copies/mL). A negative result must be combined with clinical observations, patient history, and epidemiological information. The expected result is Negative.  Fact Sheet for  Patients:  EntrepreneurPulse.com.au  Fact Sheet for Healthcare Providers:  IncredibleEmployment.be  This test is no t yet approved or cleared by the [place] FDA and  has been authorized for detection and/or diagnosis of SARS-CoV-2 by FDA under an Emergency Use Authorization (EUA). This EUA will remain  in effect (meaning this test can be used) for the duration of the COVID-19 declaration under Section 564(b)(1) of the Act, 21 U.S.C.section 360bbb-3(b)(1), unless the authorization is terminated  or revoked sooner.       Influenza A by PCR NEGATIVE NEGATIVE Final   Influenza B by PCR NEGATIVE NEGATIVE Final    Comment: (NOTE) The Xpert Xpress SARS-CoV-2/FLU/RSV plus assay is intended as an aid in the diagnosis of influenza from Nasopharyngeal swab specimens and should not be used as a sole basis for treatment. Nasal washings and aspirates are unacceptable for Xpert Xpress SARS-CoV-2/FLU/RSV testing.  Fact Sheet for Patients: EntrepreneurPulse.com.au  Fact Sheet for Healthcare Providers: IncredibleEmployment.be  This test is not yet approved or cleared by the [place] FDA and has been authorized for detection and/or diagnosis of SARS-CoV-2 by FDA under an Emergency Use Authorization (EUA). This EUA will remain in effect (meaning this test can be used) for the duration of the COVID-19 declaration under Section 564(b)(1) of the Act,  21 U.S.C. section 360bbb-3(b)(1), unless the authorization is terminated or revoked.  Performed at [facility], [place] [address], [place], [place] [address]   Culture, blood (routine x 2)     Status: None (Preliminary result)   Collection Time: 07/18/21 12:49 PM   Specimen: BLOOD  Result Value Ref Range Status   Specimen Description   Final    BLOOD BLOOD LEFT WRIST Performed at [place] [facility] [address], [place], [place]  [address]    Special Requests   Final    BOTTLES DRAWN AEROBIC AND ANAEROBIC Blood Culture adequate volume Performed at [place] [address], [place], [place] [address]    Culture   Final    NO GROWTH 2 DAYS Performed at [place] [facility] [address], [place], [place] [address]    Report Status PENDING  Incomplete  Culture, blood (routine x 2)     Status: None (Preliminary result)   Collection Time: 07/18/21  1:15 PM   Specimen: BLOOD  Result Value Ref Range Status   Specimen Description   Final    BLOOD LEFT ANTECUBITAL Performed at [place] [address] [place], [place] [address]    Special Requests   Final    BOTTLES DRAWN AEROBIC AND ANAEROBIC Blood Culture results may not be optimal due to an inadequate volume of blood received in culture bottles Performed at [place] [address], [place], [place] [address]    Culture   Final    NO GROWTH 2 DAYS Performed at [facility] [address] [place],  [address]    Report Status PENDING  Incomplete         Radiology Studies: DG Chest Port 1 View  Result Date: 07/18/2021 CLINICAL DATA:  Shortness of breath and cough worsening over the last 4 days. EXAM: PORTABLE CHEST 1 VIEW COMPARISON:  08/25/2018 FINDINGS: Upper lungs remain clear. Question slight increased density in the lingula that could go along with mild lingular pneumonia. No advanced finding. No effusion. IMPRESSION: Question mild lingular pneumonia. Consider two-view chest radiography. Electronically Signed   By: Nelson Chimes M.D.   On: 07/18/2021 11:59        Scheduled Meds:  ARIPiprazole  30 mg Oral QHS   azithromycin  500 mg Oral Daily   benztropine  0.5 mg Oral BID   budesonide (PULMICORT) nebulizer solution  0.5 mg Nebulization BID   busPIRone  15 mg Oral TID   citalopram  20 mg Oral Daily   enoxaparin (LOVENOX) injection  40 mg Subcutaneous Q24H   gabapentin   800 mg Oral TID   guaiFENesin  600 mg Oral BID   ipratropium-albuterol  3 mL Nebulization Q6H   lamoTRIgine  150 mg Oral BID   methylPREDNISolone (SOLU-MEDROL) injection  40 mg Intravenous Q12H   mirabegron ER  25 mg Oral Daily   nicotine  14 mg Transdermal Daily   pantoprazole  40 mg Oral Daily   perphenazine  4 mg Oral QHS   potassium chloride  40 mEq Oral Daily   Continuous Infusions:  sodium chloride 75 mL/hr at 07/20/21 0553   cefTRIAXone (ROCEPHIN)  IV 2 g (07/20/21 0956)          Aline August, MD [facility] 07/20/2021, 10:20 AM

## 2021-07-21 DIAGNOSIS — F319 Bipolar disorder, unspecified: Secondary | ICD-10-CM | POA: Diagnosis not present

## 2021-07-21 DIAGNOSIS — J189 Pneumonia, unspecified organism: Secondary | ICD-10-CM | POA: Diagnosis not present

## 2021-07-21 DIAGNOSIS — E876 Hypokalemia: Secondary | ICD-10-CM | POA: Diagnosis not present

## 2021-07-21 DIAGNOSIS — J441 Chronic obstructive pulmonary disease with (acute) exacerbation: Secondary | ICD-10-CM | POA: Diagnosis not present

## 2021-07-21 MED ORDER — METHYLPREDNISOLONE SODIUM SUCC 125 MG IJ SOLR
80.0000 mg | INTRAMUSCULAR | Status: DC
  Administered 2021-07-22: 80 mg via INTRAVENOUS
  Filled 2021-07-21: qty 2

## 2021-07-21 NOTE — Progress Notes (Signed)
Pt refuses nocturnal cpap.  Pt was advised that RT is available all night should she change her mind.

## 2021-07-21 NOTE — Progress Notes (Signed)
Performed walking test today with patient on 4L of oxygen. On 4L at rest patient was saturating at 100%, walking on 4L patient was 100%. On room air ambulating patient stayed at 95-97%. Patient started to cough and become short of breath while ambulating, O2 saturations dropped to 90% and heart rate increased to 160 BPM. Dr. Starla Link made aware.

## 2021-07-21 NOTE — Progress Notes (Signed)
PROGRESS NOTE    Lauren Chapman  GXQ:[id number] DOB: 1970-05-10 DOA: 07/18/2021 PCP: Wendie Agreste, MD   Brief Narrative:  51 y.o. female with medical history significant of COPD, tobacco abuse, bipolar disorder presented with worsening shortness of breath.  She was admitted for COPD exacerbation, pneumonia and acute hypoxic respiratory failure and started on antibiotics and steroids.  Assessment & Plan:   Acute hypoxic respiratory failure COPD exacerbation Community-acquired pneumonia Ongoing tobacco use -Influenza and COVID-19 negative on presentation.  Strep pneumo antigen in the urine was negative.  Currently on Rocephin and Zithromax along with IV Solu-Medrol.  Still has significant wheezing.  Not ready for discharge yet.  Continue current nebs.  Decrease Solu-Medrol to 80 mg IV every 24 hours. -Currently on 4 L oxygen via nasal cannula.  Wean off as able.  Does not wear oxygen at home.  Might need supplemental oxygen on discharge. -Outpatient follow-up with pulmonary -Counseled regarding tobacco cessation.  Continue nicotine patch  Hypokalemia -Resolved  Bipolar disorder -Noted to be on citalopram, gabapentin, Lamictal, Abilify, Cogentin, BuSpar perphenazine.   GERD -Continue PPI  Obstructive sleep apnea -Continue CPAP  Obesity -Outpatient follow-up  DVT prophylaxis: Lovenox Code Status: Full Family Communication: None at bedside Disposition Plan: Status is: Inpatient Remains inpatient appropriate because: Of severity of illness.  Still with significant wheezing.    Consultants: None  Procedures: None  Antimicrobials: Rocephin and Zithromax from 07/18/2021 onwards   Subjective: Patient seen and examined at bedside.  Still feels very winded after very minimal exertion.  Does not feel ready to go home yet.  Still wheezing a lot.  No fever, chest pain, nausea, vomiting reported.  Objective: Vitals:   07/20/21 2029 07/21/21 0124 07/21/21 0422 07/21/21 0759   BP: 103/73  124/72   Pulse: 79  72   Resp: 14  16   Temp: 98.2 F (36.8 C)  (!) 97.3 F (36.3 C)   TempSrc: Oral  Oral   SpO2: 94% 93% 97% 95%  Weight:      Height:        Intake/Output Summary (Last 24 hours) at 07/21/2021 1040 Last data filed at 07/21/2021 0902 Gross per 24 hour  Intake 220 ml  Output --  Net 220 ml    Filed Weights   07/18/21 1142  Weight: 81.6 kg    Examination:  General: On room air.  No distress.  Still on 4 L oxygen via nasal cannula. ENT/neck: No thyromegaly.  JVD is not elevated  respiratory: Decreased breath sounds at bases bilaterally with some crackles; diffuse wheezing heard  CVS: S1-S2 heard, rate controlled currently Abdominal: Soft, nontender, slightly distended; no organomegaly, bowel sounds are heard Extremities: Trace lower extremity edema; no cyanosis  CNS: Awake and alert.  No focal neurologic deficit.  Moves extremities Lymph: No obvious lymphadenopathy Skin: No obvious ecchymosis/lesions  psych: Flat affect.  Intermittently gets anxious.  Musculoskeletal: No obvious joint swelling/deformity    Data Reviewed: I have personally reviewed following labs and imaging studies  CBC: Recent Labs  Lab 07/18/21 1138 07/19/21 0508 07/20/21 0525  WBC 6.0 13.5* 14.7*  NEUTROABS 4.7  --   --   HGB 13.7 13.7 12.9  HCT 40.7 40.9 39.5  MCV 91.5 93.4 95.2  PLT 163 202 144    Basic Metabolic Panel: Recent Labs  Lab 07/18/21 1138 07/19/21 0508 07/20/21 0525  NA 140 144 141  K 3.2* 4.7 4.1  CL 106 110 107  CO2 25 25 24  GLUCOSE 157* 166* 161*  BUN '9 11 15  '$ CREATININE 1.13* 0.88 0.94  CALCIUM 8.9 9.5 9.5  MG 1.8  --   --     GFR: Estimated Creatinine Clearance: 70 mL/min (by C-G formula based on SCr of 0.94 mg/dL). Liver Function Tests: Recent Labs  Lab 07/19/21 0508  AST 19  ALT 22  ALKPHOS 76  BILITOT 0.5  PROT 7.3  ALBUMIN 3.8    No results for input(s): "LIPASE", "AMYLASE" in the last 168 hours. No results  for input(s): "AMMONIA" in the last 168 hours. Coagulation Profile: No results for input(s): "INR", "PROTIME" in the last 168 hours. Cardiac Enzymes: No results for input(s): "CKTOTAL", "CKMB", "CKMBINDEX", "TROPONINI" in the last 168 hours. BNP (last 3 results) No results for input(s): "PROBNP" in the last 8760 hours. HbA1C: No results for input(s): "HGBA1C" in the last 72 hours. CBG: No results for input(s): "GLUCAP" in the last 168 hours. Lipid Profile: No results for input(s): "CHOL", "HDL", "LDLCALC", "TRIG", "CHOLHDL", "LDLDIRECT" in the last 72 hours. Thyroid Function Tests: No results for input(s): "TSH", "T4TOTAL", "FREET4", "T3FREE", "THYROIDAB" in the last 72 hours. Anemia Panel: No results for input(s): "VITAMINB12", "FOLATE", "FERRITIN", "TIBC", "IRON", "RETICCTPCT" in the last 72 hours. Sepsis Labs: Recent Labs  Lab 07/18/21 1249 07/18/21 1527  LATICACIDVEN 2.6* 3.5*     Recent Results (from the past 240 hour(s))  Resp Panel by RT-PCR (Flu A&B, Covid) Anterior Nasal Swab     Status: None   Collection Time: 07/18/21 11:39 AM   Specimen: Anterior Nasal Swab  Result Value Ref Range Status   SARS Coronavirus 2 by RT PCR NEGATIVE NEGATIVE Final    Comment: (NOTE) SARS-CoV-2 target nucleic acids are NOT DETECTED.  The SARS-CoV-2 RNA is generally detectable in upper respiratory specimens during the acute phase of infection. The lowest concentration of SARS-CoV-2 viral copies this assay can detect is 138 copies/mL. A negative result does not preclude SARS-Cov-2 infection and should not be used as the sole basis for treatment or other patient management decisions. A negative result may occur with  improper specimen collection/handling, submission of specimen other than nasopharyngeal swab, presence of viral mutation(s) within the areas targeted by this assay, and inadequate number of viral copies(<138 copies/mL). A negative result must be combined with clinical  observations, patient history, and epidemiological information. The expected result is Negative.  Fact Sheet for Patients:  EntrepreneurPulse.com.au  Fact Sheet for Healthcare Providers:  IncredibleEmployment.be  This test is no t yet approved or cleared by the [place] FDA and  has been authorized for detection and/or diagnosis of SARS-CoV-2 by FDA under an Emergency Use Authorization (EUA). This EUA will remain  in effect (meaning this test can be used) for the duration of the COVID-19 declaration under Section 564(b)(1) of the Act, 21 U.S.C.section 360bbb-3(b)(1), unless the authorization is terminated  or revoked sooner.       Influenza A by PCR NEGATIVE NEGATIVE Final   Influenza B by PCR NEGATIVE NEGATIVE Final    Comment: (NOTE) The Xpert Xpress SARS-CoV-2/FLU/RSV plus assay is intended as an aid in the diagnosis of influenza from Nasopharyngeal swab specimens and should not be used as a sole basis for treatment. Nasal washings and aspirates are unacceptable for Xpert Xpress SARS-CoV-2/FLU/RSV testing.  Fact Sheet for Patients: EntrepreneurPulse.com.au  Fact Sheet for Healthcare Providers: IncredibleEmployment.be  This test is not yet approved or cleared by the [place] FDA and has been authorized for detection and/or diagnosis of SARS-CoV-2  by FDA under an Emergency Use Authorization (EUA). This EUA will remain in effect (meaning this test can be used) for the duration of the COVID-19 declaration under Section 564(b)(1) of the Act, 21 U.S.C. section 360bbb-3(b)(1), unless the authorization is terminated or revoked.  Performed at [facility], [place] [address], [place], [place] [address]   Culture, blood (routine x 2)     Status: None (Preliminary result)   Collection Time: 07/18/21 12:49 PM   Specimen: BLOOD  Result Value Ref Range Status   Specimen Description    Final    BLOOD BLOOD LEFT WRIST Performed at [facility] [address], [place], [place] [address]    Special Requests   Final    BOTTLES DRAWN AEROBIC AND ANAEROBIC Blood Culture adequate volume Performed at [facility] [address] [place], [place] [address]    Culture   Final    NO GROWTH 3 DAYS Performed at [facility], [place] [address], [place], [place] [address]    Report Status PENDING  Incomplete  Culture, blood (routine x 2)     Status: None (Preliminary result)   Collection Time: 07/18/21  1:15 PM   Specimen: BLOOD  Result Value Ref Range Status   Specimen Description   Final    BLOOD LEFT ANTECUBITAL Performed at [facility] [address] [place], [place] [address]    Special Requests   Final    BOTTLES DRAWN AEROBIC AND ANAEROBIC Blood Culture results may not be optimal due to an inadequate volume of blood received in culture bottles Performed at [place] [address], [place], [place] [address]    Culture   Final    NO GROWTH 3 DAYS Performed at [facility], [place] [address] [place], [place] [address]    Report Status PENDING  Incomplete         Radiology Studies: No results found.      Scheduled Meds:  ARIPiprazole  30 mg Oral QHS   azithromycin  500 mg Oral Daily   benztropine  0.5 mg Oral BID   budesonide (PULMICORT) nebulizer solution  0.5 mg Nebulization BID   busPIRone  15 mg Oral TID   citalopram  20 mg Oral Daily   enoxaparin (LOVENOX) injection  40 mg Subcutaneous Q24H   gabapentin  800 mg Oral TID   guaiFENesin  600 mg Oral BID   ipratropium-albuterol  3 mL Nebulization Q6H   lamoTRIgine  150 mg Oral BID   methylPREDNISolone (SOLU-MEDROL) injection  80 mg Intravenous Q12H   mirabegron ER  25 mg Oral Daily   nicotine  14 mg Transdermal Daily   pantoprazole  40 mg Oral Daily   perphenazine  4 mg Oral QHS   potassium chloride  40 mEq  Oral Daily   Continuous Infusions:  cefTRIAXone (ROCEPHIN)  IV 2 g (07/21/21 1006)          Aline August, MD [facility] 07/21/2021, 10:40 AM

## 2021-07-22 ENCOUNTER — Ambulatory Visit: Payer: Medicare Other | Admitting: Neurology

## 2021-07-22 DIAGNOSIS — J189 Pneumonia, unspecified organism: Secondary | ICD-10-CM | POA: Diagnosis not present

## 2021-07-22 DIAGNOSIS — K219 Gastro-esophageal reflux disease without esophagitis: Secondary | ICD-10-CM | POA: Diagnosis not present

## 2021-07-22 DIAGNOSIS — F319 Bipolar disorder, unspecified: Secondary | ICD-10-CM | POA: Diagnosis not present

## 2021-07-22 DIAGNOSIS — J441 Chronic obstructive pulmonary disease with (acute) exacerbation: Secondary | ICD-10-CM | POA: Diagnosis not present

## 2021-07-22 MED ORDER — GUAIFENESIN ER 600 MG PO TB12
1200.0000 mg | ORAL_TABLET | Freq: Two times a day (BID) | ORAL | Status: DC
  Administered 2021-07-22 – 2021-07-23 (×2): 1200 mg via ORAL
  Filled 2021-07-22 (×2): qty 2

## 2021-07-22 MED ORDER — METHYLPREDNISOLONE SODIUM SUCC 125 MG IJ SOLR
80.0000 mg | Freq: Two times a day (BID) | INTRAMUSCULAR | Status: DC
  Administered 2021-07-22 – 2021-07-23 (×3): 80 mg via INTRAVENOUS
  Filled 2021-07-22 (×3): qty 2

## 2021-07-22 MED ORDER — IPRATROPIUM-ALBUTEROL 0.5-2.5 (3) MG/3ML IN SOLN
3.0000 mL | RESPIRATORY_TRACT | Status: DC
  Administered 2021-07-22 (×5): 3 mL via RESPIRATORY_TRACT
  Filled 2021-07-22 (×5): qty 3

## 2021-07-22 MED ORDER — IPRATROPIUM-ALBUTEROL 0.5-2.5 (3) MG/3ML IN SOLN
3.0000 mL | Freq: Three times a day (TID) | RESPIRATORY_TRACT | Status: DC
  Administered 2021-07-23 (×2): 3 mL via RESPIRATORY_TRACT
  Filled 2021-07-22 (×2): qty 3

## 2021-07-22 MED ORDER — GUAIFENESIN 100 MG/5ML PO LIQD: 10.0000 mL | ORAL | Status: DC | PRN

## 2021-07-22 NOTE — Care Management Important Message (Signed)
Important Message  Patient Details  Name: Lauren Chapman MRN: [id number] Date of Birth: 1970/09/29   Medicare Important Message Given:  Yes     Memory Argue 07/22/2021, 2:50 PM

## 2021-07-22 NOTE — Progress Notes (Signed)
   07/22/21 0547  Assess: MEWS Score  Temp 98.3 F (36.8 C)  BP 100/83  MAP (mmHg) 91  Pulse Rate 76  Resp (!) 22  Level of Consciousness Alert  SpO2 96 %  O2 Device Nasal Cannula  O2 Flow Rate (L/min) 4 L/min  Assess: MEWS Score  MEWS Temp 0  MEWS Systolic 1  MEWS Pulse 0  MEWS RR 1  MEWS LOC 0  MEWS Score 2  MEWS Score Color Yellow  Assess: if the MEWS score is Yellow or Red  Were vital signs taken at a resting state? Yes  Focused Assessment No change from prior assessment  Does the patient meet 2 or more of the SIRS criteria? No  MEWS guidelines implemented *See Row Information* Yes  Treat  MEWS Interventions Administered scheduled meds/treatments;Administered prn meds/treatments  Pain Scale 0-10  Pain Score 0  Take Vital Signs  Increase Vital Sign Frequency  Yellow: Q 2hr X 2 then Q 4hr X 2, if remains yellow, continue Q 4hrs  Escalate  MEWS: Escalate Yellow: discuss with charge nurse/RN and consider discussing with provider and RRT  Notify: Charge Nurse/RN  Name of Charge Nurse/RN Notified Research officer, political party  Date Charge Nurse/RN Notified 07/22/21  Time Charge Nurse/RN Notified 0700  Document  Patient Outcome Stabilized after interventions  Progress note created (see row info) Yes  Assess: SIRS CRITERIA  SIRS Temperature  0  SIRS Pulse 0  SIRS Respirations  1  SIRS WBC 0  SIRS Score Sum  1    Patient Alert and oriented X 4. RT was contacted and gave patient PRN breathing treatment. Patient Denies  SOB after breathing treatment was administered. Will continue to monitor patient.

## 2021-07-22 NOTE — Progress Notes (Signed)
Pt refused cpap tonight. Machine remained bedside. Neb tx given. Pt said she wants to rest and sleep.

## 2021-07-22 NOTE — Progress Notes (Signed)
PROGRESS NOTE    Lauren Chapman  VOJ:[id number] DOB: 30-Jun-1970 DOA: 07/18/2021 PCP: Wendie Agreste, MD   Brief Narrative:  51 y.o. female with medical history significant of COPD, tobacco abuse, bipolar disorder presented with worsening shortness of breath.  She was admitted for COPD exacerbation, pneumonia and acute hypoxic respiratory failure and started on antibiotics and steroids.  Assessment & Plan:   Acute hypoxic respiratory failure COPD exacerbation Community-acquired pneumonia Ongoing tobacco use -Influenza and COVID-19 negative on presentation.  Strep pneumo antigen in the urine was negative.  Currently on Rocephin and Zithromax along with IV Solu-Medrol.  Still has significant wheezing.  Not ready for discharge yet.  Continue current nebs.  Increase Solu-Medrol to 80 mg IV every 12 hours. -Currently on 4 L oxygen via nasal cannula.  Wean off as able.  Does not wear oxygen at home.  Might need supplemental oxygen on discharge. -Outpatient follow-up with pulmonary -Counseled regarding tobacco cessation.  Continue nicotine patch  Hypokalemia -Resolved  Bipolar disorder -Noted to be on citalopram, gabapentin, Lamictal, Abilify, Cogentin, BuSpar perphenazine.   GERD -Continue PPI  Obstructive sleep apnea -Continue CPAP  Obesity -Outpatient follow-up  DVT prophylaxis: Lovenox Code Status: Full Family Communication: None at bedside Disposition Plan: Status is: Inpatient Remains inpatient appropriate because: Of severity of illness.  Still with significant wheezing.    Consultants: None  Procedures: None  Antimicrobials: Rocephin and Zithromax from 07/18/2021 onwards   Subjective: Patient seen and examined at bedside.  Feels slightly better but still feels very winded even walking to the bathroom.  Does not feel she is ready to go home today yet.  No overnight fever or vomiting reported. Objective: Vitals:   07/22/21 0745 07/22/21 0806 07/22/21 0811  07/22/21 0933  BP: (!) 112/59   (!) 107/52  Pulse: 72   81  Resp: 19   20  Temp: 98.2 F (36.8 C)   98 F (36.7 C)  TempSrc: Oral   Oral  SpO2: 97% 96% 96% 93%  Weight:      Height:        Intake/Output Summary (Last 24 hours) at 07/22/2021 1036 Last data filed at 07/22/2021 0935 Gross per 24 hour  Intake 1049 ml  Output --  Net 1049 ml    Filed Weights   07/18/21 1142  Weight: 81.6 kg    Examination:  General: No acute distress.  Currently still on 3 to 4 L oxygen via nasal cannula.  Looks chronically ill and deconditioned.  Slow to respond.   ENT/neck: No palpable thyromegaly or JVD elevation noted  respiratory: Bilateral decreased breath sounds at bases with diffuse wheezing heard  CVS: Currently rate controlled; S1 and S2 are heard  abdominal: Soft, nontender, distended mildly; no organomegaly, bowel sounds are heard normally  extremities: No clubbing; mild lower extremity edema present   Data Reviewed: I have personally reviewed following labs and imaging studies  CBC: Recent Labs  Lab 07/18/21 1138 07/19/21 0508 07/20/21 0525  WBC 6.0 13.5* 14.7*  NEUTROABS 4.7  --   --   HGB 13.7 13.7 12.9  HCT 40.7 40.9 39.5  MCV 91.5 93.4 95.2  PLT 163 202 993    Basic Metabolic Panel: Recent Labs  Lab 07/18/21 1138 07/19/21 0508 07/20/21 0525  NA 140 144 141  K 3.2* 4.7 4.1  CL 106 110 107  CO2 '25 25 24  '$ GLUCOSE 157* 166* 161*  BUN '9 11 15  '$ CREATININE 1.13* 0.88 0.94  CALCIUM 8.9  9.5 9.5  MG 1.8  --   --     GFR: Estimated Creatinine Clearance: 70 mL/min (by C-G formula based on SCr of 0.94 mg/dL). Liver Function Tests: Recent Labs  Lab 07/19/21 0508  AST 19  ALT 22  ALKPHOS 76  BILITOT 0.5  PROT 7.3  ALBUMIN 3.8    No results for input(s): "LIPASE", "AMYLASE" in the last 168 hours. No results for input(s): "AMMONIA" in the last 168 hours. Coagulation Profile: No results for input(s): "INR", "PROTIME" in the last 168 hours. Cardiac  Enzymes: No results for input(s): "CKTOTAL", "CKMB", "CKMBINDEX", "TROPONINI" in the last 168 hours. BNP (last 3 results) No results for input(s): "PROBNP" in the last 8760 hours. HbA1C: No results for input(s): "HGBA1C" in the last 72 hours. CBG: No results for input(s): "GLUCAP" in the last 168 hours. Lipid Profile: No results for input(s): "CHOL", "HDL", "LDLCALC", "TRIG", "CHOLHDL", "LDLDIRECT" in the last 72 hours. Thyroid Function Tests: No results for input(s): "TSH", "T4TOTAL", "FREET4", "T3FREE", "THYROIDAB" in the last 72 hours. Anemia Panel: No results for input(s): "VITAMINB12", "FOLATE", "FERRITIN", "TIBC", "IRON", "RETICCTPCT" in the last 72 hours. Sepsis Labs: Recent Labs  Lab 07/18/21 1249 07/18/21 1527  LATICACIDVEN 2.6* 3.5*     Recent Results (from the past 240 hour(s))  Resp Panel by RT-PCR (Flu A&B, Covid) Anterior Nasal Swab     Status: None   Collection Time: 07/18/21 11:39 AM   Specimen: Anterior Nasal Swab  Result Value Ref Range Status   SARS Coronavirus 2 by RT PCR NEGATIVE NEGATIVE Final    Comment: (NOTE) SARS-CoV-2 target nucleic acids are NOT DETECTED.  The SARS-CoV-2 RNA is generally detectable in upper respiratory specimens during the acute phase of infection. The lowest concentration of SARS-CoV-2 viral copies this assay can detect is 138 copies/mL. A negative result does not preclude SARS-Cov-2 infection and should not be used as the sole basis for treatment or other patient management decisions. A negative result may occur with  improper specimen collection/handling, submission of specimen other than nasopharyngeal swab, presence of viral mutation(s) within the areas targeted by this assay, and inadequate number of viral copies(<138 copies/mL). A negative result must be combined with clinical observations, patient history, and epidemiological information. The expected result is Negative.  Fact Sheet for Patients:   EntrepreneurPulse.com.au  Fact Sheet for Healthcare Providers:  IncredibleEmployment.be  This test is no t yet approved or cleared by the [place] FDA and  has been authorized for detection and/or diagnosis of SARS-CoV-2 by FDA under an Emergency Use Authorization (EUA). This EUA will remain  in effect (meaning this test can be used) for the duration of the COVID-19 declaration under Section 564(b)(1) of the Act, 21 U.S.C.section 360bbb-3(b)(1), unless the authorization is terminated  or revoked sooner.       Influenza A by PCR NEGATIVE NEGATIVE Final   Influenza B by PCR NEGATIVE NEGATIVE Final    Comment: (NOTE) The Xpert Xpress SARS-CoV-2/FLU/RSV plus assay is intended as an aid in the diagnosis of influenza from Nasopharyngeal swab specimens and should not be used as a sole basis for treatment. Nasal washings and aspirates are unacceptable for Xpert Xpress SARS-CoV-2/FLU/RSV testing.  Fact Sheet for Patients: EntrepreneurPulse.com.au  Fact Sheet for Healthcare Providers: IncredibleEmployment.be  This test is not yet approved or cleared by the [place] FDA and has been authorized for detection and/or diagnosis of SARS-CoV-2 by FDA under an Emergency Use Authorization (EUA). This EUA will remain in effect (meaning this test  can be used) for the duration of the COVID-19 declaration under Section 564(b)(1) of the Act, 21 U.S.C. section 360bbb-3(b)(1), unless the authorization is terminated or revoked.  Performed at [facility], [place] [address], [place], [place] [address]   Culture, blood (routine x 2)     Status: None (Preliminary result)   Collection Time: 07/18/21 12:49 PM   Specimen: BLOOD  Result Value Ref Range Status   Specimen Description   Final    BLOOD BLOOD LEFT WRIST Performed at [facility] [address] [place], [place] [address]     Special Requests   Final    BOTTLES DRAWN AEROBIC AND ANAEROBIC Blood Culture adequate volume Performed at [facility] [address] [place], [place] [address]    Culture   Final    NO GROWTH 3 DAYS Performed at [facility], [place] [address], [place], [place] [address]    Report Status PENDING  Incomplete  Culture, blood (routine x 2)     Status: None (Preliminary result)   Collection Time: 07/18/21  1:15 PM   Specimen: BLOOD  Result Value Ref Range Status   Specimen Description   Final    BLOOD LEFT ANTECUBITAL Performed at [facility] [address], [place], [place] [address]    Special Requests   Final    BOTTLES DRAWN AEROBIC AND ANAEROBIC Blood Culture results may not be optimal due to an inadequate volume of blood received in culture bottles Performed at [facility] [address], [place], [place] [address]    Culture   Final    NO GROWTH 3 DAYS Performed at [facility], [place] [address], [place], [place] [address]    Report Status PENDING  Incomplete         Radiology Studies: No results found.      Scheduled Meds:  ARIPiprazole  30 mg Oral QHS   azithromycin  500 mg Oral Daily   benztropine  0.5 mg Oral BID   budesonide (PULMICORT) nebulizer solution  0.5 mg Nebulization BID   busPIRone  15 mg Oral TID   citalopram  20 mg Oral Daily   enoxaparin (LOVENOX) injection  40 mg Subcutaneous Q24H   gabapentin  800 mg Oral TID   guaiFENesin  600 mg Oral BID   ipratropium-albuterol  3 mL Nebulization Q4H   lamoTRIgine  150 mg Oral BID   methylPREDNISolone (SOLU-MEDROL) injection  80 mg Intravenous Q24H   mirabegron ER  25 mg Oral Daily   nicotine  14 mg Transdermal Daily   pantoprazole  40 mg Oral Daily   perphenazine  4 mg Oral QHS   potassium chloride  40 mEq Oral Daily   Continuous Infusions:  cefTRIAXone (ROCEPHIN)  IV 2 g (07/22/21 1751)          Aline August,  MD [facility] 07/22/2021, 10:36 AM

## 2021-07-22 NOTE — TOC Transition Note (Signed)
Transition of Care [facility] - CM/SW Discharge Note   Patient Details  Name: Lauren Chapman MRN: [id number] Date of Birth: 02/02/1970  [facility]) CM/SW Contact:  Vassie Moselle, LCSW Phone Number: 07/22/2021, 9:53 AM   Clinical Narrative:    Met with pt and confirmed plans to discharge home with O2. O2 is to be delivered to pt's room by [employer]. No further TOC needs identified.    Final next level of care: Home/Self Care Barriers to Discharge: No Barriers Identified   Patient Goals and CMS Choice Patient states their goals for this hospitalization and ongoing recovery are:: To return home   Choice offered to / list presented to : Patient  Discharge Placement                       Discharge Plan and Services                DME Arranged: Oxygen DME Agency: [employer] Date DME Agency Contacted: 07/22/21 Time DME Agency Contacted: [phone] Representative spoke with at DME Agency: [facility] ([place]) Interventions     Readmission Risk Interventions    07/22/2021    9:53 AM 07/20/2021   12:35 PM  Readmission Risk Prevention Plan  Transportation Screening Complete Complete  PCP or Specialist Appt within 5-7 Days Complete Complete  Home Care Screening Complete Complete  Medication Review (RN CM) Complete Complete

## 2021-07-22 NOTE — Progress Notes (Signed)
SATURATION QUALIFICATIONS: (This note is used to comply with regulatory documentation for home oxygen)  Patient Saturations on Room Air at Rest = 90%  Patient Saturations on Room Air while Ambulating = 93%  Patient was not able to walk w/ oxygen d/t being fatigue.   Please briefly explain why patient needs home oxygen: oxygen remains greater than 90% while ambulating on room air.

## 2021-07-23 DIAGNOSIS — J441 Chronic obstructive pulmonary disease with (acute) exacerbation: Secondary | ICD-10-CM | POA: Diagnosis not present

## 2021-07-23 DIAGNOSIS — J189 Pneumonia, unspecified organism: Secondary | ICD-10-CM | POA: Diagnosis not present

## 2021-07-23 DIAGNOSIS — F319 Bipolar disorder, unspecified: Secondary | ICD-10-CM | POA: Diagnosis not present

## 2021-07-23 DIAGNOSIS — G4733 Obstructive sleep apnea (adult) (pediatric): Secondary | ICD-10-CM | POA: Diagnosis not present

## 2021-07-23 LAB — BASIC METABOLIC PANEL
Anion gap: 7 (ref 5–15)
BUN: 23 mg/dL — ABNORMAL HIGH (ref 6–20)
CO2: 30 mmol/L (ref 22–32)
Calcium: 9.5 mg/dL (ref 8.9–10.3)
Chloride: 98 mmol/L (ref 98–111)
Creatinine, Ser: 0.81 mg/dL (ref 0.44–1.00)
GFR, Estimated: >60 mL/min (ref >60)
Glucose, Bld: 210 mg/dL — ABNORMAL HIGH (ref 70–99)
Potassium: 4.8 mmol/L (ref 3.5–5.1)
Sodium: 135 mmol/L (ref 135–145)

## 2021-07-23 LAB — CULTURE, BLOOD (ROUTINE X 2)
Culture: NO GROWTH
Culture: NO GROWTH
Special Requests: ADEQUATE

## 2021-07-23 LAB — MAGNESIUM: Magnesium: 2.4 mg/dL (ref 1.7–2.4)

## 2021-07-23 MED ORDER — GUAIFENESIN ER 600 MG PO TB12: 1200.0000 mg | ORAL_TABLET | Freq: Two times a day (BID) | ORAL | 0 refills | Status: DC

## 2021-07-23 MED ORDER — PREDNISONE 20 MG PO TABS: 40.0000 mg | ORAL_TABLET | Freq: Every day | ORAL | 0 refills | Status: AC

## 2021-07-23 MED ORDER — BUDESONIDE-FORMOTEROL FUMARATE 80-4.5 MCG/ACT IN AERO: 2.0000 | INHALATION_SPRAY | Freq: Two times a day (BID) | RESPIRATORY_TRACT | 0 refills | Status: DC

## 2021-07-23 NOTE — Plan of Care (Signed)
Pt leaving this afternoon with her O2 tank and tubing. Pt aware of how to use. Discharge instructions given/explained with pt verbalizing understanding. Pt aware to followup with PCP, Neuro, and Pulmonology. Pt without c/o. Ready to go. Will leave with her parents.

## 2021-07-23 NOTE — Discharge Summary (Signed)
Physician Discharge Summary  Lauren Chapman RJJ:[id number] DOB: February 21, 1970 DOA: 07/18/2021  PCP: Wendie Agreste, MD  Admit date: 07/18/2021 Discharge date: 07/23/2021  Admitted From: Home Disposition: Home  Recommendations for Outpatient Follow-up:  Follow up with PCP in 1 week with repeat CBC/BMP Outpatient follow-up with pulmonary Comply with medications and follow-up Try and quit smoking Follow up in ED if symptoms worsen or new appear   Home Health: No Equipment/Devices: Oxygen via nasal cannula at 2 to 4 L/min  Discharge Condition: Stable CODE STATUS: Full Diet recommendation: Heart healthy  Brief/Interim Summary: 51 y.o. female with medical history significant of COPD, tobacco abuse, bipolar disorder presented with worsening shortness of breath.  She was admitted for COPD exacerbation, pneumonia and acute hypoxic respiratory failure and started on antibiotics and steroids.  During the hospitalization, her condition has gradually improved.  Her wheezing is improving but she is still requiring supplemental oxygen.  She will be discharged home on oral prednisone along with supplemental oxygen.  Discharge Diagnoses:   Acute hypoxic respiratory failure COPD exacerbation Community-acquired pneumonia Ongoing tobacco use -Influenza and COVID-19 negative on presentation.  Strep pneumo antigen in the urine was negative.  Currently on Rocephin and Zithromax along with IV Solu-Medrol.  Wheezing is improving.  Today is day #5 of antibiotics.  No need for any further antibiotics.  -Currently on 2-4 L oxygen via nasal cannula. Does not wear oxygen at home.  Will need supplemental oxygen by nasal cannula at discharge at 2 to 4 L/min. -Outpatient follow-up with pulmonary -Counseled regarding tobacco cessation.   -Patient feels better and feels okay to discharge home today.  We will put her on prednisone 40 mg daily for 7 days.  Also prescribe Symbicort.  Continue as needed albuterol    Hypokalemia -Resolved  Bipolar disorder -Noted to be on citalopram, gabapentin, Lamictal, Abilify, Cogentin, BuSpar perphenazine.  Outpatient follow-up with PCP/psychiatry to see if some of these medications can be adjusted   Obstructive sleep apnea -Continue CPAP.  Patient has been noncompliant with CPAP while in the hospital.  Obesity -Outpatient follow-up  Discharge Instructions  Discharge Instructions     Ambulatory referral to Pulmonology   Complete by: As directed    Reason for referral: Asthma/COPD   Diet - low sodium heart healthy   Complete by: As directed    Increase activity slowly   Complete by: As directed       Allergies as of 07/23/2021       Reactions   Codeine Anaphylaxis, Other (See Comments)   Cannot have ANYTHING with codeine        Medication List     TAKE these medications    albuterol 108 (90 Base) MCG/ACT inhaler Commonly known as: VENTOLIN HFA Inhale 2 puffs into the lungs every 4 (four) hours as needed for wheezing or shortness of breath. What changed: when to take this   ARIPiprazole 30 MG tablet Commonly known as: ABILIFY Take 30 mg by mouth at bedtime.   benztropine 0.5 MG tablet Commonly known as: COGENTIN Take 0.5 mg by mouth 2 (two) times daily.   budesonide-formoterol 80-4.5 MCG/ACT inhaler Commonly known as: Symbicort Inhale 2 puffs into the lungs 2 (two) times daily.   busPIRone 15 MG tablet Commonly known as: BUSPAR Take 15 mg by mouth 3 (three) times daily.   citalopram 20 MG tablet Commonly known as: CELEXA Take 20 mg by mouth daily.   EPINEPHrine 0.3 mg/0.3 mL Soaj injection Commonly known as: EPI-PEN Inject  0.3 mg into the muscle as needed for anaphylaxis.   esomeprazole 20 MG capsule Commonly known as: NEXIUM Take 1 capsule (20 mg total) by mouth 2 (two) times daily before a meal.   gabapentin 800 MG tablet Commonly known as: NEURONTIN Take 800 mg by mouth 3 (three) times daily.   guaiFENesin 600 MG 12  hr tablet Commonly known as: MUCINEX Take 2 tablets (1,200 mg total) by mouth 2 (two) times daily.   lamoTRIgine 150 MG tablet Commonly known as: LAMICTAL Take 150 mg by mouth 2 (two) times daily.   Myrbetriq 25 MG Tb24 tablet Generic drug: mirabegron ER Take 25 mg by mouth daily.   ondansetron 4 MG tablet Commonly known as: ZOFRAN Take 1 tablet (4 mg total) by mouth every 8 (eight) hours as needed for nausea or vomiting.   perphenazine 4 MG tablet Commonly known as: TRILAFON Take 4 mg by mouth at bedtime.   predniSONE 20 MG tablet Commonly known as: DELTASONE Take 2 tablets (40 mg total) by mouth daily with breakfast for 7 days. Start taking on: July 24, 2021   SUMAtriptan 6 MG/0.5ML Soln injection Commonly known as: IMITREX Inject 6 mg into the skin See admin instructions. INJECT 6 MG (0.5 ml) INTO THE SKIN AS NEEDED FOR MIGRAINE OR HEADACHE. MAY REPEAT ONCE IN 2 HOURS IF HEADACHE PERSISTS OR RECURS. MAX 2 DOSES IN 24 HOURS.   tiZANidine 4 MG tablet Commonly known as: ZANAFLEX Take 1 tablet (4 mg total) by mouth every 6 (six) hours as needed for muscle spasms. TAKE 1 TABLET BY MOUTH EVERY 6 HOURS AS NEEDED FOR MUSCLE SPASMS. What changed: additional instructions               Durable Medical Equipment  (From admission, onward)           Start     Ordered   07/22/21 1112  For home use only DME oxygen  Once       Question Answer Comment  Length of Need 6 Months   Mode or (Route) Nasal cannula   Liters per Minute 4   Frequency Continuous (stationary and portable oxygen unit needed)   Oxygen conserving device Yes   Oxygen delivery system Gas      07/22/21 1111            Follow-up Information     Wendie Agreste, MD. Schedule an appointment as soon as possible for a visit in 1 week(s).   Specialties: Family Medicine, Sports Medicine Contact information: [place] [address] [phone]                Allergies   Allergen Reactions   Codeine Anaphylaxis and Other (See Comments)    Cannot have ANYTHING with codeine    Consultations: None   Procedures/Studies: DG Chest Port 1 View  Result Date: 07/18/2021 CLINICAL DATA:  Shortness of breath and cough worsening over the last 4 days. EXAM: PORTABLE CHEST 1 VIEW COMPARISON:  08/25/2018 FINDINGS: Upper lungs remain clear. Question slight increased density in the lingula that could go along with mild lingular pneumonia. No advanced finding. No effusion. IMPRESSION: Question mild lingular pneumonia. Consider two-view chest radiography. Electronically Signed   By: Nelson Chimes M.D.   On: 07/18/2021 11:59      Subjective: Patient seen and examined at bedside.  Feels better enough to go home today.  Still slightly short of breath with exertion.  No overnight fever, nausea, vomiting or chest  pain reported.  Discharge Exam: Vitals:   07/23/21 1030 07/23/21 1039  BP:    Pulse:    Resp:    Temp:    SpO2: 97% 96%    General: Pt is alert, awake, not in acute distress.  Currently on 2 to 4 L oxygen via nasal cannula.  Looks chronically ill and deconditioned.  Anxious looking. Cardiovascular: rate controlled, S1/S2 + Respiratory: bilateral decreased breath sounds at bases with some mild wheezing Abdominal: Soft, NT, ND, bowel sounds + Extremities: Trace lower extremity edema; no cyanosis    The results of significant diagnostics from this hospitalization (including imaging, microbiology, ancillary and laboratory) are listed below for reference.     Microbiology: Recent Results (from the past 240 hour(s))  Resp Panel by RT-PCR (Flu A&B, Covid) Anterior Nasal Swab     Status: None   Collection Time: 07/18/21 11:39 AM   Specimen: Anterior Nasal Swab  Result Value Ref Range Status   SARS Coronavirus 2 by RT PCR NEGATIVE NEGATIVE Final    Comment: (NOTE) SARS-CoV-2 target nucleic acids are NOT DETECTED.  The SARS-CoV-2 RNA is generally detectable  in upper respiratory specimens during the acute phase of infection. The lowest concentration of SARS-CoV-2 viral copies this assay can detect is 138 copies/mL. A negative result does not preclude SARS-Cov-2 infection and should not be used as the sole basis for treatment or other patient management decisions. A negative result may occur with  improper specimen collection/handling, submission of specimen other than nasopharyngeal swab, presence of viral mutation(s) within the areas targeted by this assay, and inadequate number of viral copies(<138 copies/mL). A negative result must be combined with clinical observations, patient history, and epidemiological information. The expected result is Negative.  Fact Sheet for Patients:  EntrepreneurPulse.com.au  Fact Sheet for Healthcare Providers:  IncredibleEmployment.be  This test is no t yet approved or cleared by the [place] FDA and  has been authorized for detection and/or diagnosis of SARS-CoV-2 by FDA under an Emergency Use Authorization (EUA). This EUA will remain  in effect (meaning this test can be used) for the duration of the COVID-19 declaration under Section 564(b)(1) of the Act, 21 U.S.C.section 360bbb-3(b)(1), unless the authorization is terminated  or revoked sooner.       Influenza A by PCR NEGATIVE NEGATIVE Final   Influenza B by PCR NEGATIVE NEGATIVE Final    Comment: (NOTE) The Xpert Xpress SARS-CoV-2/FLU/RSV plus assay is intended as an aid in the diagnosis of influenza from Nasopharyngeal swab specimens and should not be used as a sole basis for treatment. Nasal washings and aspirates are unacceptable for Xpert Xpress SARS-CoV-2/FLU/RSV testing.  Fact Sheet for Patients: EntrepreneurPulse.com.au  Fact Sheet for Healthcare Providers: IncredibleEmployment.be  This test is not yet approved or cleared by the [place] FDA and has  been authorized for detection and/or diagnosis of SARS-CoV-2 by FDA under an Emergency Use Authorization (EUA). This EUA will remain in effect (meaning this test can be used) for the duration of the COVID-19 declaration under Section 564(b)(1) of the Act, 21 U.S.C. section 360bbb-3(b)(1), unless the authorization is terminated or revoked.  Performed at [facility], [place] [address], [place], [place] [address]   Culture, blood (routine x 2)     Status: None   Collection Time: 07/18/21 12:49 PM   Specimen: BLOOD  Result Value Ref Range Status   Specimen Description   Final    BLOOD BLOOD LEFT WRIST Performed at [facility], [address] [place], [place] [address]    Special Requests   Final    BOTTLES DRAWN AEROBIC AND ANAEROBIC Blood Culture adequate volume Performed at [place] [address], [place], [place] [address]    Culture   Final    NO GROWTH 5 DAYS Performed at [facility], [place] [address] [place], [place] [address]    Report Status 07/23/2021 FINAL  Final  Culture, blood (routine x 2)     Status: None   Collection Time: 07/18/21  1:15 PM   Specimen: BLOOD  Result Value Ref Range Status   Specimen Description   Final    BLOOD LEFT ANTECUBITAL Performed at [facility] [address] [place], [place] [address]    Special Requests   Final    BOTTLES DRAWN AEROBIC AND ANAEROBIC Blood Culture results may not be optimal due to an inadequate volume of blood received in culture bottles Performed at [facility] [address] [place], [place] [address]    Culture   Final    NO GROWTH 5 DAYS Performed at [facility], [place] [address] [place], [place] [address]    Report Status 07/23/2021 FINAL  Final     Labs: BNP (last 3 results) No results for input(s): "BNP" in the last 8760 hours. Basic Metabolic Panel: Recent Labs  Lab 07/18/21 1138  07/19/21 0508 07/20/21 0525 07/23/21 0627  NA 140 144 141 135  K 3.2* 4.7 4.1 4.8  CL 106 110 107 98  CO2 '25 25 24 30  '$ GLUCOSE 157* 166* 161* 210*  BUN '9 11 15 '$ 23*  CREATININE 1.13* 0.88 0.94 0.81  CALCIUM 8.9 9.5 9.5 9.5  MG 1.8  --   --  2.4   Liver Function Tests: Recent Labs  Lab 07/19/21 0508  AST 19  ALT 22  ALKPHOS 76  BILITOT 0.5  PROT 7.3  ALBUMIN 3.8   No results for input(s): "LIPASE", "AMYLASE" in the last 168 hours. No results for input(s): "AMMONIA" in the last 168 hours. CBC: Recent Labs  Lab 07/18/21 1138 07/19/21 0508 07/20/21 0525  WBC 6.0 13.5* 14.7*  NEUTROABS 4.7  --   --   HGB 13.7 13.7 12.9  HCT 40.7 40.9 39.5  MCV 91.5 93.4 95.2  PLT 163 202 202   Cardiac Enzymes: No results for input(s): "CKTOTAL", "CKMB", "CKMBINDEX", "TROPONINI" in the last 168 hours. BNP: Invalid input(s): "POCBNP" CBG: No results for input(s): "GLUCAP" in the last 168 hours. D-Dimer No results for input(s): "DDIMER" in the last 72 hours. Hgb A1c No results for input(s): "HGBA1C" in the last 72 hours. Lipid Profile No results for input(s): "CHOL", "HDL", "LDLCALC", "TRIG", "CHOLHDL", "LDLDIRECT" in the last 72 hours. Thyroid function studies No results for input(s): "TSH", "T4TOTAL", "T3FREE", "THYROIDAB" in the last 72 hours.  Invalid input(s): "FREET3" Anemia work up No results for input(s): "VITAMINB12", "FOLATE", "FERRITIN", "TIBC", "IRON", "RETICCTPCT" in the last 72 hours. Urinalysis    Component Value Date/Time   COLORURINE YELLOW 03/14/2020 1251   APPEARANCEUR CLOUDY (A) 03/14/2020 1251   LABSPEC 1.012 03/14/2020 1251   PHURINE 5.0 03/14/2020 1251   GLUCOSEU NEGATIVE 03/14/2020 1251   HGBUR LARGE (A) 03/14/2020 1251   BILIRUBINUR NEGATIVE 03/14/2020 1251   BILIRUBINUR negative 04/18/2018 1520   BILIRUBINUR neg 10/11/2011 1920   KETONESUR NEGATIVE 03/14/2020 1251   PROTEINUR 30 (A) 03/14/2020 1251   UROBILINOGEN 0.2 02/20/2020 1716   NITRITE  POSITIVE (A) 03/14/2020 1251   LEUKOCYTESUR LARGE (A) 03/14/2020 1251   Sepsis Labs Recent Labs  Lab 07/18/21 1138 07/19/21 0508 07/20/21 0525  WBC 6.0 13.5* 14.7*   Microbiology Recent Results (from the past 240 hour(s))  Resp Panel by RT-PCR (Flu A&B, Covid) Anterior Nasal Swab     Status: None   Collection Time: 07/18/21 11:39 AM   Specimen: Anterior Nasal Swab  Result Value Ref Range Status   SARS Coronavirus 2 by RT PCR NEGATIVE NEGATIVE Final    Comment: (NOTE) SARS-CoV-2 target nucleic acids are NOT DETECTED.  The SARS-CoV-2 RNA is generally detectable in upper respiratory specimens during the acute phase of infection. The lowest concentration of SARS-CoV-2 viral copies this assay can detect is 138 copies/mL. A negative result does not preclude SARS-Cov-2 infection and should not be used as the sole basis for treatment or other patient management decisions. A negative result may occur with  improper specimen collection/handling, submission of specimen other than nasopharyngeal swab, presence of viral mutation(s) within the areas targeted by this assay, and inadequate number of viral copies(<138 copies/mL). A negative result must be combined with clinical observations, patient history, and epidemiological information. The expected result is Negative.  Fact Sheet for Patients:  EntrepreneurPulse.com.au  Fact Sheet for Healthcare Providers:  IncredibleEmployment.be  This test is no t yet approved or cleared by the [place] FDA and  has been authorized for detection and/or diagnosis of SARS-CoV-2 by FDA under an Emergency Use Authorization (EUA). This EUA will remain  in effect (meaning this test can be used) for the duration of the COVID-19 declaration under Section 564(b)(1) of the Act, 21 U.S.C.section 360bbb-3(b)(1), unless the authorization is terminated  or revoked sooner.       Influenza A by PCR NEGATIVE NEGATIVE  Final   Influenza B by PCR NEGATIVE NEGATIVE Final    Comment: (NOTE) The Xpert Xpress SARS-CoV-2/FLU/RSV plus assay is intended as an aid in the diagnosis of influenza from Nasopharyngeal swab specimens and should not be used as a sole basis for treatment. Nasal washings and aspirates are unacceptable for Xpert Xpress SARS-CoV-2/FLU/RSV testing.  Fact Sheet for Patients: EntrepreneurPulse.com.au  Fact Sheet for Healthcare Providers: IncredibleEmployment.be  This test is not yet approved or cleared by the [place] FDA and has been authorized for detection and/or diagnosis of SARS-CoV-2 by FDA under an Emergency Use Authorization (EUA). This EUA will remain in effect (meaning this test can be used) for the duration of the COVID-19 declaration under Section 564(b)(1) of the Act, 21 U.S.C. section 360bbb-3(b)(1), unless the authorization is terminated or revoked.  Performed at [facility], [place] [address] [place], [place] [address]   Culture, blood (routine x 2)     Status: None   Collection Time: 07/18/21 12:49 PM   Specimen: BLOOD  Result Value Ref Range Status   Specimen Description   Final    BLOOD BLOOD LEFT WRIST Performed at [facility] [address], [place], [place] [address]    Special Requests   Final    BOTTLES DRAWN AEROBIC AND ANAEROBIC Blood Culture adequate volume Performed at [facility] [address], [place], [place] [address]    Culture   Final    NO GROWTH 5 DAYS Performed at [facility], [place] [address] [place], [place] [address]    Report Status 07/23/2021 FINAL  Final  Culture, blood (routine x 2)     Status: None  Collection Time: 07/18/21  1:15 PM   Specimen: BLOOD  Result Value Ref Range Status   Specimen Description   Final    BLOOD LEFT ANTECUBITAL Performed at [facility] [address] [place], [place]  [address]    Special Requests   Final    BOTTLES DRAWN AEROBIC AND ANAEROBIC Blood Culture results may not be optimal due to an inadequate volume of blood received in culture bottles Performed at [facility] [address], [place], [place] [address]    Culture   Final    NO GROWTH 5 DAYS Performed at [facility], [place] [address] [place], [place] [address]    Report Status 07/23/2021 FINAL  Final     Time coordinating discharge: 35 minutes  SIGNED:   Aline August, MD  [facility] 07/23/2021, 11:59 AM

## 2021-07-25 ENCOUNTER — Telehealth: Payer: Self-pay

## 2021-07-25 ENCOUNTER — Telehealth: Payer: Self-pay | Admitting: Family Medicine

## 2021-07-25 NOTE — Telephone Encounter (Signed)
Transition Care Management Follow-up Telephone Call Date of discharge and from where: 07/23/2021 Lauren Chapman How have you been since you were released from the hospital? Pt states she is still having some issues with SOB but is feeling better.  Any questions or concerns? No  Items Reviewed: Did the pt receive and understand the discharge instructions provided? Yes  Medications obtained and verified? Yes  Other? No  Any new allergies since your discharge? No  Dietary orders reviewed? Yes Do you have support at home? Yes   Home Care and Equipment/Supplies: Were home health services ordered? not applicable If so, what is the name of the agency? N/A  Has the agency set up a time to come to the patient's home? not applicable Were any new equipment or medical supplies ordered?  Yes: O2 What is the name of the medical supply agency? [employer] Were you able to get the supplies/equipment? yes Do you have any questions related to the use of the equipment or supplies? No  Functional Questionnaire: (I = Independent and D = Dependent) ADLs: I  Bathing/Dressing- I  Meal Prep- I  Eating- I  Maintaining continence- I  Transferring/Ambulation- I  Managing Meds- I  Follow up appointments reviewed:  PCP Hospital f/u appt confirmed? Yes  Scheduled to see Dr. Merri Ray on 08/05/2021 @ 11:20am. [facility] f/u appt confirmed? Yes  Scheduled to see [facility] on 08/02/21 @ 9:30. Are transportation arrangements needed? No  If their condition worsens, is the pt aware to call PCP or go to the Emergency Dept.? Yes Was the patient provided with contact information for the PCP's office or ED? Yes Was to pt encouraged to call back with questions or concerns? Yes

## 2021-07-25 NOTE — Telephone Encounter (Signed)
Pt called stating that she was recently discharged from the hospital. She has had to cancel a trip for next week.  She was wondering if a note could be written explaining that she should not be flying so that she can get at refund from the airline.

## 2021-07-26 ENCOUNTER — Encounter: Payer: Self-pay | Admitting: Family Medicine

## 2021-07-26 NOTE — Telephone Encounter (Signed)
Note completed.  Let me know if signature needed and can complete once I return in office next week.

## 2021-07-31 ENCOUNTER — Emergency Department (HOSPITAL_COMMUNITY): Payer: Medicare Other

## 2021-07-31 ENCOUNTER — Encounter (HOSPITAL_COMMUNITY): Payer: Self-pay | Admitting: *Deleted

## 2021-07-31 ENCOUNTER — Emergency Department (HOSPITAL_COMMUNITY)
Admission: EM | Admit: 2021-07-31 | Discharge: 2021-07-31 | Disposition: A | Discharge status: Home / Self Care | Payer: Medicare Other | Attending: Emergency Medicine | Admitting: Emergency Medicine

## 2021-07-31 ENCOUNTER — Other Ambulatory Visit: Payer: Self-pay

## 2021-07-31 DIAGNOSIS — M25551 Pain in right hip: Secondary | ICD-10-CM | POA: Diagnosis not present

## 2021-07-31 DIAGNOSIS — E860 Dehydration: Secondary | ICD-10-CM | POA: Insufficient documentation

## 2021-07-31 DIAGNOSIS — Z7951 Long term (current) use of inhaled steroids: Secondary | ICD-10-CM | POA: Insufficient documentation

## 2021-07-31 DIAGNOSIS — J449 Chronic obstructive pulmonary disease, unspecified: Secondary | ICD-10-CM | POA: Diagnosis not present

## 2021-07-31 DIAGNOSIS — M25561 Pain in right knee: Secondary | ICD-10-CM | POA: Insufficient documentation

## 2021-07-31 DIAGNOSIS — M25571 Pain in right ankle and joints of right foot: Secondary | ICD-10-CM | POA: Insufficient documentation

## 2021-07-31 DIAGNOSIS — N179 Acute kidney failure, unspecified: Secondary | ICD-10-CM | POA: Diagnosis not present

## 2021-07-31 DIAGNOSIS — M25562 Pain in left knee: Secondary | ICD-10-CM | POA: Diagnosis not present

## 2021-07-31 DIAGNOSIS — F17200 Nicotine dependence, unspecified, uncomplicated: Secondary | ICD-10-CM | POA: Diagnosis not present

## 2021-07-31 DIAGNOSIS — M25572 Pain in left ankle and joints of left foot: Secondary | ICD-10-CM | POA: Insufficient documentation

## 2021-07-31 DIAGNOSIS — R519 Headache, unspecified: Secondary | ICD-10-CM | POA: Diagnosis not present

## 2021-07-31 DIAGNOSIS — M25552 Pain in left hip: Secondary | ICD-10-CM | POA: Diagnosis not present

## 2021-07-31 DIAGNOSIS — R55 Syncope and collapse: Secondary | ICD-10-CM | POA: Insufficient documentation

## 2021-07-31 DIAGNOSIS — D72829 Elevated white blood cell count, unspecified: Secondary | ICD-10-CM | POA: Diagnosis not present

## 2021-07-31 LAB — CK: Total CK: 35 U/L — ABNORMAL LOW (ref 38–234)

## 2021-07-31 LAB — CBC WITH DIFFERENTIAL/PLATELET
Abs Immature Granulocytes: 0.17 10*3/uL — ABNORMAL HIGH (ref 0.00–0.07)
Basophils Absolute: 0 10*3/uL (ref 0.0–0.1)
Basophils Relative: 0 %
Eosinophils Absolute: 0 10*3/uL (ref 0.0–0.5)
Eosinophils Relative: 0 %
HCT: 39.6 % (ref 36.0–46.0)
Hemoglobin: 13.3 g/dL (ref 12.0–15.0)
Immature Granulocytes: 2 %
Lymphocytes Relative: 33 %
Lymphs Abs: 3.6 10*3/uL (ref 0.7–4.0)
MCH: 31.1 pg (ref 26.0–34.0)
MCHC: 33.6 g/dL (ref 30.0–36.0)
MCV: 92.7 fL (ref 80.0–100.0)
Monocytes Absolute: 0.4 10*3/uL (ref 0.1–1.0)
Monocytes Relative: 4 %
Neutro Abs: 6.6 10*3/uL (ref 1.7–7.7)
Neutrophils Relative %: 61 %
Platelets: 205 10*3/uL (ref 150–400)
RBC: 4.27 MIL/uL (ref 3.87–5.11)
RDW: 13.2 % (ref 11.5–15.5)
WBC: 10.9 10*3/uL — ABNORMAL HIGH (ref 4.0–10.5)
nRBC: 0 % (ref 0.0–0.2)

## 2021-07-31 LAB — COMPREHENSIVE METABOLIC PANEL
ALT: 34 U/L (ref 0–44)
AST: 17 U/L (ref 15–41)
Albumin: 3.5 g/dL (ref 3.5–5.0)
Alkaline Phosphatase: 68 U/L (ref 38–126)
Anion gap: 9 (ref 5–15)
BUN: 17 mg/dL (ref 6–20)
CO2: 29 mmol/L (ref 22–32)
Calcium: 8.5 mg/dL — ABNORMAL LOW (ref 8.9–10.3)
Chloride: 103 mmol/L (ref 98–111)
Creatinine, Ser: 1.05 mg/dL — ABNORMAL HIGH (ref 0.44–1.00)
GFR, Estimated: >60 mL/min (ref >60)
Glucose, Bld: 93 mg/dL (ref 70–99)
Potassium: 3.7 mmol/L (ref 3.5–5.1)
Sodium: 141 mmol/L (ref 135–145)
Total Bilirubin: 0.5 mg/dL (ref 0.3–1.2)
Total Protein: 6.1 g/dL — ABNORMAL LOW (ref 6.5–8.1)

## 2021-07-31 LAB — TROPONIN I (HIGH SENSITIVITY)
Troponin I (High Sensitivity): 3 ng/L (ref <18)
Troponin I (High Sensitivity): 3 ng/L (ref <18)

## 2021-07-31 MED ORDER — DEXAMETHASONE SODIUM PHOSPHATE 10 MG/ML IJ SOLN
10.0000 mg | Freq: Once | INTRAMUSCULAR | Status: AC
Start: 2021-07-31 — End: 2021-07-31
  Administered 2021-07-31: 10 mg via INTRAVENOUS
  Filled 2021-07-31: qty 1

## 2021-07-31 MED ORDER — LACTATED RINGERS IV SOLN: INTRAVENOUS | Status: DC

## 2021-07-31 MED ORDER — LACTATED RINGERS IV BOLUS
1000.0000 mL | Freq: Once | INTRAVENOUS | Status: AC
  Administered 2021-07-31: 1000 mL via INTRAVENOUS

## 2021-07-31 MED ORDER — METOCLOPRAMIDE HCL 5 MG/ML IJ SOLN
10.0000 mg | Freq: Once | INTRAMUSCULAR | Status: AC
Start: 2021-07-31 — End: 2021-07-31
  Administered 2021-07-31: 10 mg via INTRAVENOUS
  Filled 2021-07-31: qty 2

## 2021-07-31 NOTE — ED Triage Notes (Signed)
Pt BIB EMS with reports of joint pain in both her legs.  She was not very specific to EMS in regards to qualities of pain today. States she was hospitalized last week for sepsis from pneumonia.   EMS VS: BP 120/9, HR: 65, 96%RA CBG: 89

## 2021-07-31 NOTE — Discharge Instructions (Signed)
Make sure you are drinking plenty of fluids and staying hydrated.  Thankfully today all of your blood work looks normal.  Make sure you get plenty of rest tonight.  If you start running fever or having any breathing problems follow-up with your doctor or return to the emergency room.  If you continue to have headaches and feel dizzy or have any more episodes of passing out you need to follow-up with your doctor as you may need more testing.

## 2021-07-31 NOTE — ED Provider Notes (Signed)
[facility] DEPT Provider Note   CSN: [id number] Arrival date & time: 07/31/21  1325     History  Chief Complaint  Patient presents with   Leg Pain    bilateral    Lauren Chapman is a 51 y.o. female.  Pt is a 51y/o female with hx of COPD, tobacco abuse, bipolar disorder with recent COPD exacerbation, pneumonia and acute hypoxic respiratory failure d/ced on 07/23/21 who went home with O2 and lives with her mother presenting today with EMS for loss of consciousness and change in mental status.  Patient and mother give the history.  Patient gives very little history except that she is having a headache and her joints hurt in her upper and lower extremities.  Her mother reports that she had been doing well since getting home from the hospital and they have been gradually weaning the oxygen.  She did not have oxygen on today because her sats were 94% on room air.  This morning she drove to the grocery store and came back home.  Approximately an hour and a half prior to arrival her mother was in her sewing room when her daughter walked to the doorway and said her name and then she reports she fell backwards and lost consciousness.  She had complained of a headache in her joints aching prior to that.  Her mother did not witness any shaking there was no tongue biting but she reports she could not wake the patient up and called 911.  She reports it was probably 15 to 20 minutes before she regained consciousness and currently the patient just complains of being sleepy.  She denies any chest pain or shortness of breath.  She did eat breakfast this morning and does not have prior history of syncope except for 1 time prior per the patient.  She reports when she went in to get her mom she felt lightheaded like she might pass out.  She has not had any nausea vomiting or abdominal pain.  Other than completing the antibiotics prior to hospital discharge and doing a prednisone taper she  had not been on any new medications.  No fever or tick exposure.  Other than the fall today when she passed out she has had no other trauma.  The history is provided by the patient.  Leg Pain      Home Medications Prior to Admission medications   Medication Sig Start Date End Date Taking? Authorizing Provider  albuterol (VENTOLIN HFA) 108 (90 Base) MCG/ACT inhaler Inhale 2 puffs into the lungs every 4 (four) hours as needed for wheezing or shortness of breath. Patient taking differently: Inhale 2 puffs into the lungs every hour as needed for wheezing or shortness of breath. 02/20/20   Olalere, Cicero Duck A, MD  ARIPiprazole (ABILIFY) 30 MG tablet Take 30 mg by mouth at bedtime. 12/23/20   [provider]  benztropine (COGENTIN) 0.5 MG tablet Take 0.5 mg by mouth 2 (two) times daily. 12/23/20   [provider]  budesonide-formoterol (SYMBICORT) 80-4.5 MCG/ACT inhaler Inhale 2 puffs into the lungs 2 (two) times daily. 07/23/21   Aline August, MD  busPIRone (BUSPAR) 15 MG tablet Take 15 mg by mouth 3 (three) times daily. 07/30/20   [provider]  citalopram (CELEXA) 20 MG tablet Take 20 mg by mouth daily.    [provider]  EPINEPHrine 0.3 mg/0.3 mL IJ SOAJ injection Inject 0.3 mg into the muscle as needed for anaphylaxis. 03/29/21  Azucena Cecil, PA-C  esomeprazole (NEXIUM) 20 MG capsule Take 1 capsule (20 mg total) by mouth 2 (two) times daily before a meal. 03/21/19   Jacelyn Pi, Lilia Argue, MD  gabapentin (NEURONTIN) 800 MG tablet Take 800 mg by mouth 3 (three) times daily.  07/02/18   [provider]  guaiFENesin (MUCINEX) 600 MG 12 hr tablet Take 2 tablets (1,200 mg total) by mouth 2 (two) times daily. 07/23/21   Aline August, MD  lamoTRIgine (LAMICTAL) 150 MG tablet Take 150 mg by mouth 2 (two) times daily.     [provider]  MYRBETRIQ 25 MG TB24 tablet Take 25 mg by mouth daily. 07/08/21   [provider]  ondansetron (ZOFRAN)  4 MG tablet Take 1 tablet (4 mg total) by mouth every 8 (eight) hours as needed for nausea or vomiting. 01/05/20   Suzzanne Cloud, NP  perphenazine (TRILAFON) 4 MG tablet Take 4 mg by mouth at bedtime. 06/07/21   [provider]  predniSONE (DELTASONE) 20 MG tablet Take 2 tablets (40 mg total) by mouth daily with breakfast for 7 days. 07/24/21 07/31/21  Aline August, MD  SUMAtriptan (IMITREX) 6 MG/0.5ML SOLN injection Inject 6 mg into the skin See admin instructions. INJECT 6 MG (0.5 ml) INTO THE SKIN AS NEEDED FOR MIGRAINE OR HEADACHE. MAY REPEAT ONCE IN 2 HOURS IF HEADACHE PERSISTS OR RECURS. MAX 2 DOSES IN 24 HOURS. 06/11/21   [provider]  tiZANidine (ZANAFLEX) 4 MG tablet Take 1 tablet (4 mg total) by mouth every 6 (six) hours as needed for muscle spasms. TAKE 1 TABLET BY MOUTH EVERY 6 HOURS AS NEEDED FOR MUSCLE SPASMS. Patient taking differently: Take 4 mg by mouth every 6 (six) hours as needed for muscle spasms. 12/10/20   Wendie Agreste, MD      Allergies    Codeine    Review of Systems   Review of Systems  Physical Exam Updated Vital Signs BP 102/68   Pulse (!) 58   Temp 98.1 F (36.7 C) (Oral)   Resp 16   Ht '5\' 3"'$  (1.6 m)   Wt 81.6 kg   LMP 05/02/2019 Comment: irregular cycles  SpO2 98%   BMI 31.89 kg/m  Physical Exam Vitals and nursing note reviewed.  Constitutional:      General: She is not in acute distress.    Appearance: She is well-developed.     Comments: Sleepy but will respond to voice  HENT:     Head: Normocephalic and atraumatic.  Eyes:     Extraocular Movements: Extraocular movements intact.     Conjunctiva/sclera: Conjunctivae normal.     Pupils: Pupils are equal, round, and reactive to light.  Cardiovascular:     Rate and Rhythm: Normal rate and regular rhythm.     Heart sounds: Normal heart sounds. No murmur heard.    No friction rub.  Pulmonary:     Effort: Pulmonary effort is normal.     Breath sounds: Normal breath sounds.  No wheezing or rales.  Abdominal:     General: Bowel sounds are normal. There is no distension.     Palpations: Abdomen is soft.     Tenderness: There is no abdominal tenderness. There is no guarding or rebound.  Musculoskeletal:        General: Tenderness present. Normal range of motion.     Right lower leg: No edema.     Left lower leg: No edema.     Comments:  Patient reports tenderness with palpating the ankles knees and hips but there is no erythema, swelling or discomfort  Skin:    General: Skin is warm and dry.     Findings: No rash.  Neurological:     Comments: Is noted to move all 4 extremities.  Sensation is intact.  Minimally participates with exam  Psychiatric:     Comments: Somewhat limited historian but with repeat questioning patient will answer questions     ED Results / Procedures / Treatments   Labs (all labs ordered are listed, but only abnormal results are displayed) Labs Reviewed  CBC WITH DIFFERENTIAL/PLATELET - Abnormal; Notable for the following components:      Result Value   WBC 10.9 (*)    Abs Immature Granulocytes 0.17 (*)    All other components within normal limits  COMPREHENSIVE METABOLIC PANEL - Abnormal; Notable for the following components:   Creatinine, Ser 1.05 (*)    Calcium 8.5 (*)    Total Protein 6.1 (*)    All other components within normal limits  CK - Abnormal; Notable for the following components:   Total CK 35 (*)    All other components within normal limits  SARS CORONAVIRUS 2 BY RT PCR  TROPONIN I (HIGH SENSITIVITY)  TROPONIN I (HIGH SENSITIVITY)    EKG EKG Interpretation  Date/Time:  Sunday July 31 2021 19:33:29 EDT Ventricular Rate:  58 PR Interval:  124 QRS Duration: 89 QT Interval:  422 QTC Calculation: 415 R Axis:   44 Text Interpretation: Sinus rhythm RSR' in V1 or V2, probably normal variant Normal ECG Confirmed by Blanchie Dessert [phone]) on 07/31/2021 7:42:08 PM  Radiology CT Head Wo Contrast  Result Date:  07/31/2021 CLINICAL DATA:  Moderate to severe headache. EXAM: CT HEAD WITHOUT CONTRAST TECHNIQUE: Contiguous axial images were obtained from the base of the skull through the vertex without intravenous contrast. RADIATION DOSE REDUCTION: This exam was performed according to the departmental dose-optimization program which includes automated exposure control, adjustment of the mA and/or kV according to patient size and/or use of iterative reconstruction technique. COMPARISON:  06/07/2021 FINDINGS: Brain: There is no evidence for acute hemorrhage, hydrocephalus, mass lesion, or abnormal extra-axial fluid collection. No definite CT evidence for acute infarction. Vascular: No hyperdense vessel or unexpected calcification. Skull: No evidence for fracture. No worrisome lytic or sclerotic lesion. Sinuses/Orbits: The visualized paranasal sinuses and mastoid air cells are clear. Visualized portions of the globes and intraorbital fat are unremarkable. Other: None. IMPRESSION: No acute intracranial abnormality. Electronically Signed   By: Misty Stanley M.D.   On: 07/31/2021 14:45   DG Chest Port 1 View  Result Date: 07/31/2021 CLINICAL DATA:  Pt c/o joint pain in both her legs. Dizziness. States she was hospitalized last week for sepsis from pneumonia. H/o emphysema. Smoker. EXAM: PORTABLE CHEST 1 VIEW COMPARISON:  07/18/2021 and older studies. FINDINGS: Cardiac silhouette is normal in size. Normal mediastinal and hilar contours. Subtle opacity at the lateral left lung base, similar to the prior exam, suspected to be atelectasis or scarring. Lungs otherwise clear. No convincing pleural effusion. No pneumothorax. Skeletal structures are grossly intact. IMPRESSION: No acute cardiopulmonary disease. Electronically Signed   By: Lajean Manes M.D.   On: 07/31/2021 14:43    Procedures Procedures    Medications Ordered in ED Medications  lactated ringers infusion (0 mLs Intravenous Stopped 07/31/21 2138)  lactated ringers  bolus 1,000 mL (0 mLs Intravenous Stopped 07/31/21 2138)  metoCLOPramide (REGLAN) injection 10 mg (10  mg Intravenous Given 07/31/21 1848)  dexamethasone (DECADRON) injection 10 mg (10 mg Intravenous Given 07/31/21 1849)    ED Course/ Medical Decision Making/ A&P                           Medical Decision Making Amount and/or Complexity of Data Reviewed External Data Reviewed: notes.    Details: recent hospitalization Labs: ordered. Decision-making details documented in ED Course. Radiology: ordered and independent interpretation performed. Decision-making details documented in ED Course. ECG/medicine tests: ordered and independent interpretation performed. Decision-making details documented in ED Course.  Risk Prescription drug management.   Pt with multiple medical problems and comorbidities and presenting today with a complaint that caries a high risk for morbidity and mortality.  Presenting today with complaint of a headache, syncopal event and diffuse joint pain.  The joint pain started with a headache prior to the syncopal event.  Patient's mother does report she suffers from migraines.  She does not have a history of frequent episodes of syncope.  Today patient does not appear to be having COPD exacerbation as she has clear breath sounds normal sats on room air and reassuring vital signs.  Patient did complain of some prodromal symptoms prior to the event and suspect either orthostatic or vasovagal syncope.  Lower suspicion for dysrhythmia and patient has no known cardiac issues.  She did hit her head with the fall we will do imaging to ensure no acute injury.  Also with a complaint of joint aches and pains could be developing new illness such as viral illness, COVID.  We will also check a CK to ensure no evidence of rhabdomyolysis.  Low suspicion that this is related to medications.  Patient given headache cocktail and IV fluids.  Labs, EKG and imaging are pending. 9:54 PM I independently  interpreted patient's EKG and labs.  EKG at this time shows no acute findings.  CBC with improving leukocytosis now at 10 from 14 and normal hemoglobin, CMP with mild AKI with creatinine 1.05 from her baseline of 0.7 and troponin is normal.  CK is low with no evidence of rhabdomyolysis.  Patient received a headache cocktail. I have independently visualized and interpreted pt's images today.  Head CT today without evidence of acute intercranial hemorrhage and chest x-ray within normal limits.  Radiology reported no acute abnormalities.  Patient given IV fluids.  Will reassess after meds.  Feel that patient will most likely be ready to go home.          Final Clinical Impression(s) / ED Diagnoses Final diagnoses:  Syncope, unspecified syncope type  Dehydration    Rx / DC Orders ED Discharge Orders     None         Blanchie Dessert, MD 07/31/21 2155

## 2021-08-02 ENCOUNTER — Ambulatory Visit (INDEPENDENT_AMBULATORY_CARE_PROVIDER_SITE_OTHER): Payer: Medicare Other | Admitting: Nurse Practitioner

## 2021-08-02 ENCOUNTER — Telehealth: Payer: Self-pay | Admitting: Nurse Practitioner

## 2021-08-02 ENCOUNTER — Encounter: Payer: Self-pay | Admitting: Nurse Practitioner

## 2021-08-02 VITALS — BP 130/82 | HR 86 | Ht 63.0 in | Wt 190.4 lb

## 2021-08-02 DIAGNOSIS — R0602 Shortness of breath: Secondary | ICD-10-CM

## 2021-08-02 DIAGNOSIS — J189 Pneumonia, unspecified organism: Secondary | ICD-10-CM

## 2021-08-02 DIAGNOSIS — Z720 Tobacco use: Secondary | ICD-10-CM | POA: Insufficient documentation

## 2021-08-02 DIAGNOSIS — J432 Centrilobular emphysema: Secondary | ICD-10-CM | POA: Diagnosis not present

## 2021-08-02 DIAGNOSIS — J9601 Acute respiratory failure with hypoxia: Secondary | ICD-10-CM

## 2021-08-02 DIAGNOSIS — R55 Syncope and collapse: Secondary | ICD-10-CM | POA: Insufficient documentation

## 2021-08-02 DIAGNOSIS — G4733 Obstructive sleep apnea (adult) (pediatric): Secondary | ICD-10-CM

## 2021-08-02 DIAGNOSIS — B3731 Acute candidiasis of vulva and vagina: Secondary | ICD-10-CM | POA: Insufficient documentation

## 2021-08-02 MED ORDER — FLUCONAZOLE 150 MG PO TABS: ORAL_TABLET | ORAL | 0 refills | Status: DC

## 2021-08-02 NOTE — Assessment & Plan Note (Signed)
No formal obstruction on PFTs from 2020. Emphysematous changes on previous CT chest. She has had a progressive decline in her breathing over the past few years. She was started on Symbicort during her hospital stay with improvement. Plan to repeat PFTs to evaluate current lung function. Continue ICS/LABA and PRN albuterol.

## 2021-08-02 NOTE — Assessment & Plan Note (Signed)
Related to recent abx use. Rx for Diflucan 150 mg x 1 sent to her pharmacy; may repeat x 1 in 72 hours if symptoms not improved.

## 2021-08-02 NOTE — Assessment & Plan Note (Signed)
Seen in ED on 7/9. Suspected to be orthostatic or vasovagal. Has not had any recurrent episodes. Has a follow up with her PCP scheduled.

## 2021-08-02 NOTE — Telephone Encounter (Signed)
Called and spoke with patient. She was seen by Joellen Jersey today and had asked if she needed to continue her O2 use. She stated that she was walked today in office and she did not need oxygen. I advised her that I would send a message over to [facility] since it wasn't clear in the office notes. She verbalized understanding.   Joellen Jersey, can you please advise if she needs to continue the oxygen?

## 2021-08-02 NOTE — Patient Instructions (Addendum)
Continue Albuterol inhaler 2 puffs or 3 mL neb every 6 hours as needed for shortness of breath or wheezing. Notify if symptoms persist despite rescue inhaler/neb use. Continue Symbicort 2 puffs Twice daily. Brush tongue and rinse mouth afterwards Continue mucinex 600 mg Twice daily for chest congestion Continue nexium 20 mg Twice daily  Continue nicotine patches through your insurance company  Referral to lung cancer screening program   Pulmonary function testing - scheduled today  Follow upin 4-6 weeks after PFTs with chest x ray beforehand with Dr Ander Slade or Alanson Aly. If symptoms do not improve or worsen, please contact office for sooner follow up or seek emergency care.

## 2021-08-02 NOTE — Assessment & Plan Note (Signed)
She has quit smoking since her discharge. She is currently using nicotine patches provided through her insurance. Encouraged to remain smoke free. Referred to lung cancer screening program.

## 2021-08-02 NOTE — Progress Notes (Signed)
$'@Patient'c$  ID: Lauren Chapman, female    DOB: 1970-11-27, 51 y.o.   MRN: [id number]  Chief Complaint  Patient presents with   [facility] f/u, pt was admitted 6 days respiratory arrest. Currently experiencing tiredness, SOB on exertion, and coughing    Referring provider: Wendie Agreste, MD  HPI: 51 year old female, active smoker followed for shortness of breath and emphysema.  She is a patient of Dr. Judson Roch and last seen in office 02/20/2020. Past medical history significant for bipolar disorder, OSA on CPAP, obesity, migraines, GERD, anxiety.  She was recently hospitalized for acute hypoxic respiratory failure related to CAP from 07/18/2021 to 07/23/2021.  COVID/flu negative.  She was treated with Rocephin and Zithromax along with IV Solu-Medrol.  She was discharged on supplemental oxygen at 2 to 4 L and prednisone 40 mg for 7 days.  She was also prescribed Symbicort for maintenance and advised to follow-up with pulmonary outpatient.  TEST/EVENTS:  09/09/2018 PFTs: FVC 82, FEV1 84, ratio 82, TLC 102, DLCOcor 88. No BD. Normal PFTs 07/18/2021 CXR: questionable mild lingular pneumonia; clear upper lobes 07/31/2021 CXR 1 view: subtle opacity at lateral left base, similar to prior and likely atelectasis, scarring or resolving pna.   08/02/2021: Today - follow up Patient presents today for hospital follow up. She reports that she has been slowly recovering since being discharged. She is still feeling a little fatigued and has an occasional congested, non-productive cough; however, both of these are improving. She was able to come off of her oxygen and has been in the 90's on room air. Her breathing feels much better on Symbicort and she hasn't had to use her albuterol but 2-3 times since being discharged. Prior to admission, she was using it almost daily. She denies any fevers, night sweats, hemoptysis, anorexia, weight loss, wheezing. She has quit smoking and is currently using nicotine  patches provided by her insurance to help her remain smoke free. She is taking mucinex for chest congestion/cough and using her Symbicort Twice daily.   She did go back to the ED on 7/9 after a syncopal episode. She had developed some joint pain and headache prior to the event. Respiratory status was stable with O2 98% on room air and ED workup was unremarkable. Suspect related to orthostatic or vasovagal syncope and possible from dehydration - treated with IV fluids and headache cocktail and discharged home. She reports feeling better since with no recurrent episodes. She has follow up scheduled with her PCP on Friday.   She is experiencing some itching and discomfort in her vaginal area and is concerned she may have developed a yeast infection from the antibiotics she was on. She has had a small amount of discharge. No odor, bleeding or pain. No urinary symptoms.   Allergies  Allergen Reactions   Codeine Anaphylaxis and Other (See Comments)    Cannot have ANYTHING with codeine    Immunization History  Administered Date(s) Administered   Influenza Inj Mdck Quad Pf 01/03/2019   Influenza Split 10/11/2011   Influenza,inj,Quad PF,6+ Mos 12/04/2017, 12/01/2019   Influenza-Unspecified 01/03/2019   PFIZER(Purple Top)SARS-COV-2 Vaccination 04/19/2019, 05/10/2019, 01/22/2020, 10/26/2020   PNEUMOCOCCAL CONJUGATE-20 08/02/2020   Tdap 10/16/2018   Zoster Recombinat (Shingrix) 05/31/2020, 08/02/2020    Past Medical History:  Diagnosis Date   Allergy    Anxiety    Bipolar affective psychosis ([place])    Complication of anesthesia    " Acting silly- waving at everyone"-Giddy   Depression  Emphysema lung ([facility])    inhaler prn,  followed by pcp and pulmonologist-- dr Margorie John   Family history of adverse reaction to anesthesia    Father had rheumatic fever, and died on operating table.   GERD (gastroesophageal reflux disease)    Kidney cysts 01/24/2020   Right Kidney   Migraine    Smokers'  cough ([place])    per pt not productive   SUI (stress urinary incontinence, female)    Tubular adenoma of colon 07/08/2020   Wears glasses     Tobacco History: Social History   Tobacco Use  Smoking Status Every Day   Packs/day: 1.00   Years: 33.00   Total pack years: 33.00   Types: Cigarettes   Start date: 10/10/1981   Last attempt to quit: 11/10/2019   Years since quitting: 1.7  Smokeless Tobacco Never   Ready to quit: Not Answered Counseling given: Not Answered   Outpatient Medications Prior to Visit  Medication Sig Dispense Refill   albuterol (VENTOLIN HFA) 108 (90 Base) MCG/ACT inhaler Inhale 2 puffs into the lungs every 4 (four) hours as needed for wheezing or shortness of breath. (Patient taking differently: Inhale 2 puffs into the lungs every hour as needed for wheezing or shortness of breath.) 1 each 1   ARIPiprazole (ABILIFY) 30 MG tablet Take 30 mg by mouth at bedtime.     benztropine (COGENTIN) 0.5 MG tablet Take 0.5 mg by mouth 2 (two) times daily.     budesonide-formoterol (SYMBICORT) 80-4.5 MCG/ACT inhaler Inhale 2 puffs into the lungs 2 (two) times daily. 1 each 0   busPIRone (BUSPAR) 15 MG tablet Take 15 mg by mouth 3 (three) times daily.     citalopram (CELEXA) 20 MG tablet Take 20 mg by mouth daily.     EPINEPHrine 0.3 mg/0.3 mL IJ SOAJ injection Inject 0.3 mg into the muscle as needed for anaphylaxis. 1 each 0   esomeprazole (NEXIUM) 20 MG capsule Take 1 capsule (20 mg total) by mouth 2 (two) times daily before a meal. 60 capsule 1   gabapentin (NEURONTIN) 800 MG tablet Take 800 mg by mouth 3 (three) times daily.      guaiFENesin (MUCINEX) 600 MG 12 hr tablet Take 2 tablets (1,200 mg total) by mouth 2 (two) times daily. 30 tablet 0   lamoTRIgine (LAMICTAL) 150 MG tablet Take 150 mg by mouth 2 (two) times daily.      MYRBETRIQ 25 MG TB24 tablet Take 25 mg by mouth daily.     ondansetron (ZOFRAN) 4 MG tablet Take 1 tablet (4 mg total) by mouth every 8 (eight) hours  as needed for nausea or vomiting. 20 tablet 6   perphenazine (TRILAFON) 4 MG tablet Take 4 mg by mouth at bedtime.     SUMAtriptan (IMITREX) 6 MG/0.5ML SOLN injection Inject 6 mg into the skin See admin instructions. INJECT 6 MG (0.5 ml) INTO THE SKIN AS NEEDED FOR MIGRAINE OR HEADACHE. MAY REPEAT ONCE IN 2 HOURS IF HEADACHE PERSISTS OR RECURS. MAX 2 DOSES IN 24 HOURS.     tiZANidine (ZANAFLEX) 4 MG tablet Take 1 tablet (4 mg total) by mouth every 6 (six) hours as needed for muscle spasms. TAKE 1 TABLET BY MOUTH EVERY 6 HOURS AS NEEDED FOR MUSCLE SPASMS. (Patient taking differently: Take 4 mg by mouth every 6 (six) hours as needed for muscle spasms.) 30 tablet 6   No facility-administered medications prior to visit.     Review of Systems:   Constitutional:  No weight loss or gain, night sweats, fevers, chills. +fatigue, lassitude (improving) HEENT: No headaches, difficulty swallowing, tooth/dental problems, or sore throat. No sneezing, itching, ear ache, nasal congestion, or post nasal drip CV:  No chest pain, orthopnea, PND, swelling in lower extremities, anasarca, dizziness, palpitations, syncope Resp: +congested cough (improving). No shortness of breath with exertion or at rest. No excess mucus or change in color of mucus. No productive or non-productive. No hemoptysis. No wheezing.  No chest wall deformity GU: +vaginal itching, small amount of discharge. No dysuria, change in color of urine, urgency or frequency.  No flank pain, no hematuria  Skin: No rash, lesions, ulcerations MSK:  No joint pain or swelling.  No decreased range of motion.  No back pain. Neuro: No dizziness or lightheadedness.  Psych: No depression or anxiety. Mood stable.     Physical Exam:  BP 130/82   Pulse 86   Ht '5\' 3"'$  (1.6 m)   Wt 190 lb 6.4 oz (86.4 kg)   LMP 05/02/2019 Comment: irregular cycles  SpO2 96%   BMI 33.73 kg/m   GEN: Pleasant, interactive, well-appearing; obese; in no acute distress. HEENT:   Normocephalic and atraumatic. PERRLA. Sclera white. Nasal turbinates pink, moist and patent bilaterally. No rhinorrhea present. Oropharynx pink and moist, without exudate or edema. No lesions, ulcerations, or postnasal drip.  NECK:  Supple w/ fair ROM. No JVD present. Normal carotid impulses w/o bruits. Thyroid symmetrical with no goiter or nodules palpated. No lymphadenopathy.   CV: RRR, no m/r/g, no peripheral edema. Pulses intact, +2 bilaterally. No cyanosis, pallor or clubbing. PULMONARY:  Unlabored, regular breathing. Clear bilaterally A&P w/o wheezes/rales/rhonchi. No accessory muscle use. No dullness to percussion. GI: BS present and normoactive. Soft, non-tender to palpation. No organomegaly or masses detected. No CVA tenderness. GU: Deferred by pt. MSK: No erythema, warmth or tenderness. Cap refil <2 sec all extrem. No deformities or joint swelling noted.  Neuro: A/Ox3. No focal deficits noted.   Skin: Warm, no lesions or rashe Psych: Normal affect and behavior. Judgement and thought content appropriate.     Lab Results:  CBC    Component Value Date/Time   WBC 10.9 (H) 07/31/2021 1419   RBC 4.27 07/31/2021 1419   HGB 13.3 07/31/2021 1419   HGB 15.2 03/24/2019 1510   HCT 39.6 07/31/2021 1419   HCT 44.8 03/24/2019 1510   PLT 205 07/31/2021 1419   PLT 241 03/24/2019 1510   MCV 92.7 07/31/2021 1419   MCV 94 03/24/2019 1510   MCH 31.1 07/31/2021 1419   MCHC 33.6 07/31/2021 1419   RDW 13.2 07/31/2021 1419   RDW 13.1 03/24/2019 1510   LYMPHSABS 3.6 07/31/2021 1419   LYMPHSABS 2.6 03/24/2019 1510   MONOABS 0.4 07/31/2021 1419   EOSABS 0.0 07/31/2021 1419   EOSABS 0.2 03/24/2019 1510   BASOSABS 0.0 07/31/2021 1419   BASOSABS 0.1 03/24/2019 1510    BMET    Component Value Date/Time   NA 141 07/31/2021 1419   NA 143 03/24/2019 1510   K 3.7 07/31/2021 1419   CL 103 07/31/2021 1419   CO2 29 07/31/2021 1419   GLUCOSE 93 07/31/2021 1419   BUN 17 07/31/2021 1419   BUN 10  03/24/2019 1510   CREATININE 1.05 (H) 07/31/2021 1419   CREATININE 1.12 (H) 08/17/2015 1608   CALCIUM 8.5 (L) 07/31/2021 1419   GFRNONAA >60 07/31/2021 1419   GFRAA 71 03/24/2019 1510    BNP No results found for: "BNP"   Imaging:  CT Head Wo Contrast  Result Date: 07/31/2021 CLINICAL DATA:  Moderate to severe headache. EXAM: CT HEAD WITHOUT CONTRAST TECHNIQUE: Contiguous axial images were obtained from the base of the skull through the vertex without intravenous contrast. RADIATION DOSE REDUCTION: This exam was performed according to the departmental dose-optimization program which includes automated exposure control, adjustment of the mA and/or kV according to patient size and/or use of iterative reconstruction technique. COMPARISON:  06/07/2021 FINDINGS: Brain: There is no evidence for acute hemorrhage, hydrocephalus, mass lesion, or abnormal extra-axial fluid collection. No definite CT evidence for acute infarction. Vascular: No hyperdense vessel or unexpected calcification. Skull: No evidence for fracture. No worrisome lytic or sclerotic lesion. Sinuses/Orbits: The visualized paranasal sinuses and mastoid air cells are clear. Visualized portions of the globes and intraorbital fat are unremarkable. Other: None. IMPRESSION: No acute intracranial abnormality. Electronically Signed   By: Misty Stanley M.D.   On: 07/31/2021 14:45   DG Chest Port 1 View  Result Date: 07/31/2021 CLINICAL DATA:  Pt c/o joint pain in both her legs. Dizziness. States she was hospitalized last week for sepsis from pneumonia. H/o emphysema. Smoker. EXAM: PORTABLE CHEST 1 VIEW COMPARISON:  07/18/2021 and older studies. FINDINGS: Cardiac silhouette is normal in size. Normal mediastinal and hilar contours. Subtle opacity at the lateral left lung base, similar to the prior exam, suspected to be atelectasis or scarring. Lungs otherwise clear. No convincing pleural effusion. No pneumothorax. Skeletal structures are grossly  intact. IMPRESSION: No acute cardiopulmonary disease. Electronically Signed   By: Lajean Manes M.D.   On: 07/31/2021 14:43   DG Chest Port 1 View  Result Date: 07/18/2021 CLINICAL DATA:  Shortness of breath and cough worsening over the last 4 days. EXAM: PORTABLE CHEST 1 VIEW COMPARISON:  08/25/2018 FINDINGS: Upper lungs remain clear. Question slight increased density in the lingula that could go along with mild lingular pneumonia. No advanced finding. No effusion. IMPRESSION: Question mild lingular pneumonia. Consider two-view chest radiography. Electronically Signed   By: Nelson Chimes M.D.   On: 07/18/2021 11:59    Eptinezumab-jjmr (VYEPTI) 100 mg in sodium chloride 0.9 % 100 mL IVPB     Date Action Dose Route User   Discharged on 07/31/2021   Admitted on 07/31/2021   Discharged on 07/23/2021   Admitted on 07/18/2021   Discharged on 07/18/2021   Admitted on 07/18/2021   06/24/2021 1100 New Bag/Given 100 mg Intravenous Koren Shiver, RN      triamcinolone acetonide (KENALOG) 10 MG/ML injection 10 mg     Date Action Dose Route User   Discharged on 07/31/2021   Admitted on 07/31/2021   Discharged on 07/23/2021   Admitted on 07/18/2021   Discharged on 07/18/2021   Admitted on 07/18/2021   06/08/2021 6063 Given 10 mg Other Wallene Huh, DPM          Latest Ref Rng & Units 09/09/2018    2:07 PM  PFT Results  FVC-Pre L 2.91   FVC-Predicted Pre % 82   FVC-Post L 3.04   FVC-Predicted Post % 85   Pre FEV1/FVC % % 81   Post FEV1/FCV % % 82   FEV1-Pre L 2.36   FEV1-Predicted Pre % 84   FEV1-Post L 2.50   DLCO uncorrected ml/min/mmHg 19.28   DLCO UNC% % 91   DLCO corrected ml/min/mmHg 18.63   DLCO COR %Predicted % 88   DLVA Predicted % 102   TLC L 5.18   TLC % Predicted % 102  RV % Predicted % 130     No results found for: "NITRICOXIDE"      Assessment & Plan:   CAP (community acquired pneumonia) Clinically improving and seems to be recovering well post hospitalization. She is no  longer requiring supplemental oxygen - walking oximetry today with SpO2 low 96% on ra. We will plan for repeat CXR in 4 weeks to ensure resolution. Advised she continue mucolytic therapy until chest congestion resolves. Notify of any productive cough, fevers, worsening respiratory symptoms, worsening fatigue.  Patient Instructions  Continue Albuterol inhaler 2 puffs or 3 mL neb every 6 hours as needed for shortness of breath or wheezing. Notify if symptoms persist despite rescue inhaler/neb use. Continue Symbicort 2 puffs Twice daily. Brush tongue and rinse mouth afterwards Continue mucinex 600 mg Twice daily for chest congestion Continue nexium 20 mg Twice daily  Continue nicotine patches through your insurance company  Referral to lung cancer screening program   Pulmonary function testing - scheduled today  Follow upin 4-6 weeks after PFTs with chest x ray beforehand with Dr Ander Slade or Alanson Aly. If symptoms do not improve or worsen, please contact office for sooner follow up or seek emergency care.     Emphysema, unspecified ([place]) No formal obstruction on PFTs from 2020. Emphysematous changes on previous CT chest. She has had a progressive decline in her breathing over the past few years. She was started on Symbicort during her hospital stay with improvement. Plan to repeat PFTs to evaluate current lung function. Continue ICS/LABA and PRN albuterol.  Syncope Seen in ED on 7/9. Suspected to be orthostatic or vasovagal. Has not had any recurrent episodes. Has a follow up with her PCP scheduled.   OSA (obstructive sleep apnea) Good compliance and continues to receive good benefit. Follow up with neurology as scheduled.   Tobacco abuse She has quit smoking since her discharge. She is currently using nicotine patches provided through her insurance. Encouraged to remain smoke free. Referred to lung cancer screening program.   Vaginal candidiasis Related to recent abx use. Rx for  Diflucan 150 mg x 1 sent to her pharmacy; may repeat x 1 in 72 hours if symptoms not improved.    I spent 42 minutes of dedicated to the care of this patient on the date of this encounter to include pre-visit review of records, face-to-face time with the patient discussing conditions above, post visit ordering of testing, clinical documentation with the electronic health record, making appropriate referrals as documented, and communicating necessary findings to members of the patients care team.  Clayton Bibles, NP 08/02/2021  Pt aware and understands NP's role.

## 2021-08-02 NOTE — Telephone Encounter (Signed)
No, she was walked in the office and maintained sats >96% on room air. Monitor at home and notify if <88-90%. Thanks.

## 2021-08-02 NOTE — Telephone Encounter (Signed)
Called patient but she did not answer. Left message for her to call back.  

## 2021-08-02 NOTE — Assessment & Plan Note (Signed)
Clinically improving and seems to be recovering well post hospitalization. She is no longer requiring supplemental oxygen - walking oximetry today with SpO2 low 96% on ra. We will plan for repeat CXR in 4 weeks to ensure resolution. Advised she continue mucolytic therapy until chest congestion resolves. Notify of any productive cough, fevers, worsening respiratory symptoms, worsening fatigue.  Patient Instructions  Continue Albuterol inhaler 2 puffs or 3 mL neb every 6 hours as needed for shortness of breath or wheezing. Notify if symptoms persist despite rescue inhaler/neb use. Continue Symbicort 2 puffs Twice daily. Brush tongue and rinse mouth afterwards Continue mucinex 600 mg Twice daily for chest congestion Continue nexium 20 mg Twice daily  Continue nicotine patches through your insurance company  Referral to lung cancer screening program   Pulmonary function testing - scheduled today  Follow upin 4-6 weeks after PFTs with chest x ray beforehand with Dr Ander Slade or Alanson Aly. If symptoms do not improve or worsen, please contact office for sooner follow up or seek emergency care.

## 2021-08-02 NOTE — Assessment & Plan Note (Signed)
Good compliance and continues to receive good benefit. Follow up with neurology as scheduled.

## 2021-08-03 NOTE — Telephone Encounter (Signed)
Called patient this evening and told her what the advise of Joellen Jersey was. Patient verbalized understanding to monitor her oxygen and to make sure her stats are above 88% and that if is not then she needs to call the office. Nothing further needed

## 2021-08-05 ENCOUNTER — Ambulatory Visit (INDEPENDENT_AMBULATORY_CARE_PROVIDER_SITE_OTHER): Payer: Medicare Other | Admitting: Family Medicine

## 2021-08-05 ENCOUNTER — Encounter: Payer: Self-pay | Admitting: Family Medicine

## 2021-08-05 VITALS — BP 120/78 | HR 69 | Temp 98.1°F | Resp 17 | Ht 63.0 in | Wt 190.2 lb

## 2021-08-05 DIAGNOSIS — J189 Pneumonia, unspecified organism: Secondary | ICD-10-CM | POA: Diagnosis not present

## 2021-08-05 DIAGNOSIS — F319 Bipolar disorder, unspecified: Secondary | ICD-10-CM

## 2021-08-05 DIAGNOSIS — R5383 Other fatigue: Secondary | ICD-10-CM | POA: Diagnosis not present

## 2021-08-05 DIAGNOSIS — Z87898 Personal history of other specified conditions: Secondary | ICD-10-CM | POA: Diagnosis not present

## 2021-08-05 DIAGNOSIS — G4733 Obstructive sleep apnea (adult) (pediatric): Secondary | ICD-10-CM

## 2021-08-05 DIAGNOSIS — R7989 Other specified abnormal findings of blood chemistry: Secondary | ICD-10-CM | POA: Diagnosis not present

## 2021-08-05 DIAGNOSIS — J441 Chronic obstructive pulmonary disease with (acute) exacerbation: Secondary | ICD-10-CM | POA: Diagnosis not present

## 2021-08-05 LAB — CBC
HCT: 41.4 % (ref 36.0–46.0)
Hemoglobin: 13.5 g/dL (ref 12.0–15.0)
MCHC: 32.6 g/dL (ref 30.0–36.0)
MCV: 93.5 fl (ref 78.0–100.0)
Platelets: 219 10*3/uL (ref 150.0–400.0)
RBC: 4.42 Mil/uL (ref 3.87–5.11)
RDW: 14.4 % (ref 11.5–15.5)
WBC: 10.1 10*3/uL (ref 4.0–10.5)

## 2021-08-05 LAB — COMPREHENSIVE METABOLIC PANEL
ALT: 44 U/L — ABNORMAL HIGH (ref 0–35)
AST: 21 U/L (ref 0–37)
Albumin: 4.1 g/dL (ref 3.5–5.2)
Alkaline Phosphatase: 76 U/L (ref 39–117)
BUN: 13 mg/dL (ref 6–23)
CO2: 30 mEq/L (ref 19–32)
Calcium: 9.4 mg/dL (ref 8.4–10.5)
Chloride: 101 mEq/L (ref 96–112)
Creatinine, Ser: 1.09 mg/dL (ref 0.40–1.20)
GFR: 58.1 mL/min — ABNORMAL LOW (ref >60.00)
Glucose, Bld: 92 mg/dL (ref 70–99)
Potassium: 5.1 mEq/L (ref 3.5–5.1)
Sodium: 138 mEq/L (ref 135–145)
Total Bilirubin: 0.5 mg/dL (ref 0.2–1.2)
Total Protein: 6.7 g/dL (ref 6.0–8.3)

## 2021-08-05 NOTE — Patient Instructions (Addendum)
I am glad to hear you are doing better and great work on quitting smoking. See info below on managing challenges with quitting smoking.   I am glad to hear that you are improving.  Make sure to drink plenty of fluids, regular meals.  Use CPAP at night consistently as that may be contributing to fatigue.  Keep follow-up as planned with pulmonary for repeat x-ray and lung function testing, continue Symbicort for now.  Mucinex can be used until cough has resolved, unless pulmonary would like you to remain on that medication.  I suspect you may be able to stop in the oxygen or send back the oxygen at this time but please clarify that with pulmonary.  Follow-up with me in 3 months but let me know if there are questions sooner.  Keep follow-up as planned with psychiatry to review your medications.  Hang in there!     Managing the Challenge of Quitting Smoking Quitting smoking is a physical and mental challenge. You may have cravings, withdrawal symptoms, and temptation to smoke. Before quitting, work with your health care provider to make a plan that can help you manage quitting. Making a plan before you quit may keep you from smoking when you have the urge to smoke while trying to quit. How to manage lifestyle changes Managing stress Stress can make you want to smoke, and wanting to smoke may cause stress. It is important to find ways to manage your stress. You could try some of the following: Practice relaxation techniques. Breathe slowly and deeply, in through your nose and out through your mouth. Listen to music. Soak in a bath or take a shower. Imagine a peaceful place or vacation. Get some support. Talk with family or friends about your stress. Join a support group. Talk with a counselor or therapist. Get some physical activity. Go for a walk, run, or bike ride. Play a favorite sport. Practice yoga.  Medicines Talk with your health care provider about medicines that might help you deal  with cravings and make quitting easier for you. Relationships Social situations can be difficult when you are quitting smoking. To manage this, you can: Avoid parties and other social situations where people might be smoking. Avoid alcohol. Leave right away if you have the urge to smoke. Explain to your family and friends that you are quitting smoking. Ask for support and let them know you might be a bit grumpy. Plan activities where smoking is not an option. General instructions Be aware that many people gain weight after they quit smoking. However, not everyone does. To keep from gaining weight, have a plan in place before you quit, and stick to the plan after you quit. Your plan should include: Eating healthy snacks. When you have a craving, it may help to: Eat popcorn, or try carrots, celery, or other cut vegetables. Chew sugar-free gum. Changing how you eat. Eat small portion sizes at meals. Eat 4-6 small meals throughout the day instead of 1-2 large meals a day. Be mindful when you eat. You should avoid watching television or doing other things that might distract you as you eat. Exercising regularly. Make time to exercise each day. If you do not have time for a long workout, do short bouts of exercise for 5-10 minutes several times a day. Do some form of strengthening exercise, such as weight lifting. Do some exercise that gets your heart beating and causes you to breathe deeply, such as walking fast, running, swimming, or biking. This  is very important. Drinking plenty of water or other low-calorie or no-calorie drinks. Drink enough fluid to keep your urine pale yellow.  How to recognize withdrawal symptoms Your body and mind may experience discomfort as you try to get used to not having nicotine in your system. These effects are called withdrawal symptoms. They may include: Feeling hungrier than normal. Having trouble concentrating. Feeling irritable or restless. Having trouble  sleeping. Feeling depressed. Craving a cigarette. These symptoms may surprise you, but they are normal to have when quitting smoking. To manage withdrawal symptoms: Avoid places, people, and activities that trigger your cravings. Remember why you want to quit. Get plenty of sleep. Avoid coffee and other drinks that contain caffeine. These may worsen some of your symptoms. How to manage cravings Come up with a plan for how to deal with your cravings. The plan should include the following: A definition of the specific situation you want to deal with. An activity or action you will take to replace smoking. A clear idea for how this action will help. The name of someone who could help you with this. Cravings usually last for 5-10 minutes. Consider taking the following actions to help you with your plan to deal with cravings: Keep your mouth busy. Chew sugar-free gum. Suck on hard candies or a straw. Brush your teeth. Keep your hands and body busy. Change to a different activity right away. Squeeze or play with a ball. Do an activity or a hobby, such as making bead jewelry, practicing needlepoint, or working with wood. Mix up your normal routine. Take a short exercise break. Go for a quick walk, or run up and down stairs. Focus on doing something kind or helpful for someone else. Call a friend or family member to talk during a craving. Join a support group. Contact a quitline. Where to find support To get help or find a support group: Call the [employer]'s Smoking Quitline: 1-800-QUIT-NOW 417-384-5034) Text QUIT to SmokefreeTXT: 536144 Where to find more information Visit these websites to find more information on quitting smoking: [place] Department of Health and Human Services: www.smokefree.gov American Lung Association: www.freedomfromsmoking.org Centers for Disease Control and Prevention (CDC): http://www.wolf.info/ American Heart Association: www.heart.org Contact a health  care provider if: You want to change your plan for quitting. The medicines you are taking are not helping. Your eating feels out of control or you cannot sleep. You feel depressed or become very anxious. Summary Quitting smoking is a physical and mental challenge. You will face cravings, withdrawal symptoms, and temptation to smoke again. Preparation can help you as you go through these challenges. Try different techniques to manage stress, handle social situations, and prevent weight gain. You can deal with cravings by keeping your mouth busy (such as by chewing gum), keeping your hands and body busy, calling family or friends, or contacting a quitline for people who want to quit smoking. You can deal with withdrawal symptoms by avoiding places where people smoke, getting plenty of rest, and avoiding drinks that contain caffeine. This information is not intended to replace advice given to you by your health care provider. Make sure you discuss any questions you have with your health care provider. Document Revised: 12/31/2020 Document Reviewed: 12/31/2020 [employer] Patient Education  [place].

## 2021-08-05 NOTE — Progress Notes (Signed)
Subjective:  Patient ID: Lauren Chapman, female    DOB: 1970-05-13  Age: 51 y.o. MRN: [id number]  CC:  Chief Complaint  Patient presents with   Hospitalization Follow-up    SOB, Pt was dx with pneumonia and sepsis with SOB, pt reports has quit smoking at this time.  Will be seeing pulmonary soon, was advised to have repeat lab work,    Depression    PHQ-9=8    HPI Lauren Chapman presents for   Hospital follow-up, transition of care visit. Admitted June 26 through July 1.  Transition of care phone call reviewed from July 3.  COPD exacerbation, acute hypoxic respiratory failure, community-acquired pneumonia. Initially presented with increasing shortness of breath.  Admitted with COPD exacerbation, pneumonia and acute hypoxic respiratory failure.  Influenza and COVID-19 were negative, strep pneumo antigen in the urine was negative.  Treated with Rocephin, azithromycin, IV Solu-Medrol.  5 days of antibiotics.  2 to 4 L of oxygen via nasal cannula and discharged on same.  Discharged on 40 mg prednisone for 1 week and started on Symbicort.  As needed albuterol.  seen in follow-up by pulmonary on July 11, note reviewed.  Improving at that time, only required albuterol a few times versus almost daily prior to hospital admission.  She had quit smoking and was using nicotine patches.  Was compliant with Symbicort and Mucinex.  Was seen in the ER on 7 9 after syncopal episode, joint pain, headache.  O2 sat was stable on room air at that time at 98% and thought to be orthostatic or vasovagal symptoms could be from dehydration, treated with IV fluids, headache cocktail discharged home and was feeling better without recurrent episodes.  She was treated with Diflucan for possible vaginal candidiasis from previous antibiotics.  Plan for repeat PFTs, chest x-ray in 4 to 6 weeks.  Continue ICS/LABA and as needed albuterol.  Continued on CPAP for OSA. No albuterol need since pulmonary visit.  Has continued to  avoid smoking. Off 17 days.  No oxygen need recently (was applied prior to syncopal episode). Home O2sat 95% or higher on RA.  No recent fever. Some cough, but improving. Still feels weak, but eating and drinking ok. No recurrence of syncope.  Off CPAP for a few weeks, restarted last night.   Creatinine increased on 7/9 to 1.05, from 0.81 prior.   WBC up to 13.5, 14.7, then 10.9 in 07/31/21.  CXR 6/26: IMPRESSION: Question mild lingular pneumonia.  Lab Results  Component Value Date   CREATININE 1.05 (H) 07/31/2021   Lab Results  Component Value Date   WBC 10.9 (H) 07/31/2021   HGB 13.3 07/31/2021   HCT 39.6 07/31/2021   MCV 92.7 07/31/2021   PLT 205 07/31/2021     Bipolar disorder She is followed by Dr. Reece Levy with psychiatry, counselor Mickeal Skinner.  On multiple medications including Abilify, Cogentin, BuSpar, Celexa, gabapentin, Lamictal, perphenazine. PHQ noted including positive response for suicidal ideation.  On my discussion she denies any suicidal ideation, and has appointment next month.       08/05/2021   11:38 AM 03/14/2021    3:01 PM 12/10/2020   10:33 AM 08/02/2020   10:10 AM 07/05/2020    9:59 AM  Depression screen PHQ 2/9  Decreased Interest 1 0 '1 1 1  '$ Down, Depressed, Hopeless '1 1 1 2 2  '$ PHQ - 2 Score '2 1 2 3 3  '$ Altered sleeping 0 1 2 0 0  Tired, decreased energy  $'3 1 2 1 2  'K$ Change in appetite 1 0 3 0 0  Feeling bad or failure about yourself  0 '1 2 1 1  '$ Trouble concentrating 1 0 1 0 0  Moving slowly or fidgety/restless 0 0 3 0 0  Suicidal thoughts 1 0 0 0 0  PHQ-9 Score '8 4 15 5 6        '$ History Patient Active Problem List   Diagnosis Date Noted   Syncope 08/02/2021   Tobacco abuse 08/02/2021   Vaginal candidiasis 08/02/2021   COPD exacerbation ([place]) 07/18/2021   CAP (community acquired pneumonia) 07/18/2021   OSA (obstructive sleep apnea) 07/18/2021   Hypokalemia 07/18/2021   Depression 03/24/2020   Bilateral low back pain with left-sided  sciatica 03/24/2020   Left lumbar radiculopathy 02/25/2020   Absence of bladder continence 02/25/2020   History of postmenopausal bleeding 04/29/2019   Polypharmacy 04/03/2019   Emphysema, unspecified ([place]) 05/17/2018   Weakness 01/25/2017   Chronic migraine w/o aura w/o status migrainosus, not intractable 12/22/2014   Encounter for other general counseling or advice on contraception 09/02/2014   Bipolar disorder ([place]) 10/12/2011   Anxiety and depression 10/12/2011   GERD (gastroesophageal reflux disease) 10/12/2011   Past Medical History:  Diagnosis Date   Allergy    Anxiety    Bipolar affective psychosis ([place])    Complication of anesthesia    " Acting silly- waving at everyone"-Giddy   Depression    Emphysema lung ([place])    inhaler prn,  followed by pcp and pulmonologist-- dr Margorie John   Family history of adverse reaction to anesthesia    Father had rheumatic fever, and died on operating table.   GERD (gastroesophageal reflux disease)    Kidney cysts 01/24/2020   Right Kidney   Migraine    Smokers' cough ([place])    per pt not productive   SUI (stress urinary incontinence, female)    Tubular adenoma of colon 07/08/2020   Wears glasses    Past Surgical History:  Procedure Laterality Date   ABDOMINAL HYSTERECTOMY N/A    Phreesia 11/30/2019   CARPAL TUNNEL RELEASE Right 09-12-2007   '@[facility]'$    and GANGLION CYST EXCISION   COLONOSCOPY     CYSTOSCOPY N/A 04/29/2019   Procedure: CYSTOSCOPY;  Surgeon: Joseph Pierini, MD;  Location: [facility];  Service: Gynecology;  Laterality: N/A;   DILATATION & CURETTAGE/HYSTEROSCOPY WITH MYOSURE N/A 11/13/2018   Procedure: DILATATION & CURETTAGE/HYSTEROSCOPY WITH MYOSURE;  Surgeon: Anastasio Auerbach, MD;  Location: [facility];  Service: Gynecology;  Laterality: N/A;  request 9:00am OR start time in [place] block requests one hour   [place]  02/2019   EYE SURGERY N/A     Phreesia 11/30/2019   GANGLION CYST EXCISION     R wrist   interstem      implant for overactive bladder   KNEE ARTHROSCOPY WITH ANTERIOR CRUCIATE LIGAMENT (ACL) REPAIR Right 10/2015   LAPAROSCOPIC CHOLECYSTECTOMY  1997   LAPAROSCOPIC HYSTERECTOMY Bilateral 04/29/2019   Procedure: HYSTERECTOMY TOTAL LAPAROSCOPIC, BILATERAL SALPINO-OOPHORECTOMY;  Surgeon: Joseph Pierini, MD;  Location: [facility];  Service: Gynecology;  Laterality: Bilateral;   TOOTH EXTRACTION  04/01/2019   tooth removal Bilateral 2022   4 teeth removed   UPPER GASTROINTESTINAL ENDOSCOPY     Allergies  Allergen Reactions   Codeine Anaphylaxis and Other (See Comments)    Cannot have ANYTHING with codeine   Prior to Admission medications  Medication Sig Start Date End Date Taking? Authorizing Provider  albuterol (VENTOLIN HFA) 108 (90 Base) MCG/ACT inhaler Inhale 2 puffs into the lungs every 4 (four) hours as needed for wheezing or shortness of breath. Patient taking differently: Inhale 2 puffs into the lungs every hour as needed for wheezing or shortness of breath. 02/20/20  Yes Olalere, Adewale A, MD  ARIPiprazole (ABILIFY) 30 MG tablet Take 30 mg by mouth at bedtime. 12/23/20  Yes [provider]  benztropine (COGENTIN) 0.5 MG tablet Take 0.5 mg by mouth 2 (two) times daily. 12/23/20  Yes [provider]  budesonide-formoterol (SYMBICORT) 80-4.5 MCG/ACT inhaler Inhale 2 puffs into the lungs 2 (two) times daily. 07/23/21  Yes Aline August, MD  busPIRone (BUSPAR) 15 MG tablet Take 15 mg by mouth 3 (three) times daily. 07/30/20  Yes [provider]  citalopram (CELEXA) 20 MG tablet Take 20 mg by mouth daily.   Yes [provider]  EPINEPHrine 0.3 mg/0.3 mL IJ SOAJ injection Inject 0.3 mg into the muscle as needed for anaphylaxis. 03/29/21  Yes Azucena Cecil, PA-C  esomeprazole (NEXIUM) 20 MG capsule Take 1 capsule (20 mg total) by mouth 2 (two) times daily before a  meal. 03/21/19  Yes Jacelyn Pi, Lilia Argue, MD  fluconazole (DIFLUCAN) 150 MG tablet Take 1 tablet by mouth once. If you are still experiencing symptoms 72 hours after taking, you may take 1 additional tablet. 08/02/21  Yes Cobb, Karie Schwalbe, NP  gabapentin (NEURONTIN) 800 MG tablet Take 800 mg by mouth 3 (three) times daily.  07/02/18  Yes [provider]  guaiFENesin (MUCINEX) 600 MG 12 hr tablet Take 2 tablets (1,200 mg total) by mouth 2 (two) times daily. 07/23/21  Yes Aline August, MD  lamoTRIgine (LAMICTAL) 150 MG tablet Take 150 mg by mouth 2 (two) times daily.    Yes [provider]  MYRBETRIQ 25 MG TB24 tablet Take 25 mg by mouth daily. 07/08/21  Yes [provider]  ondansetron (ZOFRAN) 4 MG tablet Take 1 tablet (4 mg total) by mouth every 8 (eight) hours as needed for nausea or vomiting. 01/05/20  Yes Suzzanne Cloud, NP  perphenazine (TRILAFON) 4 MG tablet Take 4 mg by mouth at bedtime. 06/07/21  Yes [provider]  SUMAtriptan (IMITREX) 6 MG/0.5ML SOLN injection Inject 6 mg into the skin See admin instructions. INJECT 6 MG (0.5 ml) INTO THE SKIN AS NEEDED FOR MIGRAINE OR HEADACHE. MAY REPEAT ONCE IN 2 HOURS IF HEADACHE PERSISTS OR RECURS. MAX 2 DOSES IN 24 HOURS. 06/11/21  Yes [provider]  tiZANidine (ZANAFLEX) 4 MG tablet Take 1 tablet (4 mg total) by mouth every 6 (six) hours as needed for muscle spasms. TAKE 1 TABLET BY MOUTH EVERY 6 HOURS AS NEEDED FOR MUSCLE SPASMS. Patient taking differently: Take 4 mg by mouth every 6 (six) hours as needed for muscle spasms. 12/10/20  Yes Wendie Agreste, MD   Social History   Socioeconomic History   Marital status: Divorced    Spouse name: n/a   Number of children: 2   Years of education: 18   Highest education level: Not on file  Occupational History   Occupation: Disabled    Comment: Formerly a Pharmacist, hospital.  Tobacco Use   Smoking status: Every Day    Packs/day: 1.00    Years: 33.00    Total pack  years: 33.00    Types: Cigarettes    Start date: 10/10/1981    Last attempt  to quit: 11/10/2019    Years since quitting: 1.7   Smokeless tobacco: Never  Vaping Use   Vaping Use: Never used  Substance and Sexual Activity   Alcohol use: Never    Alcohol/week: 0.0 standard drinks of alcohol   Drug use: Never   Sexual activity: Not Currently    Partners: Male    Birth control/protection: Post-menopausal    Comment: 1st intercourse 51 yo-More than 5 partners  Other Topics Concern   Not on file  Social History Narrative   Lives at home with 1 of her two daughters   Right-handed.   2-4 cups caffeine daily.   Disabled    One story home   Social Determinants of Health   Financial Resource Strain: Not on file  Food Insecurity: Not on file  Transportation Needs: Not on file  Physical Activity: Not on file  Stress: Not on file  Social Connections: Not on file  Intimate Partner Violence: Not on file    Review of Systems Per HPI.   Objective:   Vitals:   08/05/21 1135  BP: 120/78  Pulse: 69  Resp: 17  Temp: 98.1 F (36.7 C)  TempSrc: Oral  SpO2: 97%  Weight: 190 lb 3.2 oz (86.3 kg)  Height: '5\' 3"'$  (1.6 m)     Physical Exam Vitals reviewed.  Constitutional:      General: She is not in acute distress.    Appearance: Normal appearance. She is well-developed. She is not ill-appearing, toxic-appearing or diaphoretic.  HENT:     Head: Normocephalic and atraumatic.  Eyes:     Conjunctiva/sclera: Conjunctivae normal.     Pupils: Pupils are equal, round, and reactive to light.  Neck:     Vascular: No carotid bruit.  Cardiovascular:     Rate and Rhythm: Normal rate and regular rhythm.     Heart sounds: Normal heart sounds.  Pulmonary:     Effort: Pulmonary effort is normal. No respiratory distress.     Breath sounds: Normal breath sounds. No stridor. No wheezing, rhonchi or rales.  Abdominal:     Palpations: Abdomen is soft. There is no pulsatile mass.     Tenderness:  There is no abdominal tenderness.  Musculoskeletal:     Right lower leg: No edema.     Left lower leg: No edema.  Skin:    General: Skin is warm and dry.  Neurological:     Mental Status: She is alert and oriented to person, place, and time.  Psychiatric:        Mood and Affect: Mood normal.        Behavior: Behavior normal.        Assessment & Plan:  Lauren Chapman is a 51 y.o. female . COPD exacerbation ([place]) Community acquired pneumonia, unspecified laterality - Plan: CBC Fatigue, unspecified type - Plan: CBC History of syncope OSA (obstructive sleep apnea)  -Improving from COPD exacerbation and hypoxic respiratory failure, pneumonia.  Continue Mucinex, Symbicort, follow-up with pulmonary as scheduled.  Likely can discontinue oxygen as no recent need but advised that she clarify with pulmonary.  -Maintenance of hydration and regular meals discussed to help with fatigue but inconsistent use of CPAP may also be contributing.  Has restarted last night, consistent use stressed.  ER precautions if near syncope/syncopal symptoms return.  Elevated serum creatinine - Plan: Comprehensive metabolic panel  -Possible prerenal/volume depletion when seen in ER, improved with IV fluids, repeat creatinine.  Maintenance of hydration discussed as above.  Bipolar affective disorder, remission status unspecified ([place])  -On multiple meds as above, but managed by psychiatry.  Denies suicidal ideation.  Continue follow-up as planned with psychiatry with ER precautions.   No orders of the defined types were placed in this encounter.  Patient Instructions  I am glad to hear you are doing better and great work on quitting smoking. See info below on managing challenges with quitting smoking.   I am glad to hear that you are improving.  Make sure to drink plenty of fluids, regular meals.  Use CPAP at night consistently as that may be contributing to fatigue.  Keep follow-up as planned with pulmonary  for repeat x-ray and lung function testing, continue Symbicort for now.  Mucinex can be used until cough has resolved, unless pulmonary would like you to remain on that medication.  I suspect you may be able to stop in the oxygen or send back the oxygen at this time but please clarify that with pulmonary.  Follow-up with me in 3 months but let me know if there are questions sooner.  Keep follow-up as planned with psychiatry to review your medications.  Hang in there!     Managing the Challenge of Quitting Smoking Quitting smoking is a physical and mental challenge. You may have cravings, withdrawal symptoms, and temptation to smoke. Before quitting, work with your health care provider to make a plan that can help you manage quitting. Making a plan before you quit may keep you from smoking when you have the urge to smoke while trying to quit. How to manage lifestyle changes Managing stress Stress can make you want to smoke, and wanting to smoke may cause stress. It is important to find ways to manage your stress. You could try some of the following: Practice relaxation techniques. Breathe slowly and deeply, in through your nose and out through your mouth. Listen to music. Soak in a bath or take a shower. Imagine a peaceful place or vacation. Get some support. Talk with family or friends about your stress. Join a support group. Talk with a counselor or therapist. Get some physical activity. Go for a walk, run, or bike ride. Play a favorite sport. Practice yoga.  Medicines Talk with your health care provider about medicines that might help you deal with cravings and make quitting easier for you. Relationships Social situations can be difficult when you are quitting smoking. To manage this, you can: Avoid parties and other social situations where people might be smoking. Avoid alcohol. Leave right away if you have the urge to smoke. Explain to your family and friends that you are quitting  smoking. Ask for support and let them know you might be a bit grumpy. Plan activities where smoking is not an option. General instructions Be aware that many people gain weight after they quit smoking. However, not everyone does. To keep from gaining weight, have a plan in place before you quit, and stick to the plan after you quit. Your plan should include: Eating healthy snacks. When you have a craving, it may help to: Eat popcorn, or try carrots, celery, or other cut vegetables. Chew sugar-free gum. Changing how you eat. Eat small portion sizes at meals. Eat 4-6 small meals throughout the day instead of 1-2 large meals a day. Be mindful when you eat. You should avoid watching television or doing other things that might distract you as you eat. Exercising regularly. Make time to exercise each day. If you do not have time  for a long workout, do short bouts of exercise for 5-10 minutes several times a day. Do some form of strengthening exercise, such as weight lifting. Do some exercise that gets your heart beating and causes you to breathe deeply, such as walking fast, running, swimming, or biking. This is very important. Drinking plenty of water or other low-calorie or no-calorie drinks. Drink enough fluid to keep your urine pale yellow.  How to recognize withdrawal symptoms Your body and mind may experience discomfort as you try to get used to not having nicotine in your system. These effects are called withdrawal symptoms. They may include: Feeling hungrier than normal. Having trouble concentrating. Feeling irritable or restless. Having trouble sleeping. Feeling depressed. Craving a cigarette. These symptoms may surprise you, but they are normal to have when quitting smoking. To manage withdrawal symptoms: Avoid places, people, and activities that trigger your cravings. Remember why you want to quit. Get plenty of sleep. Avoid coffee and other drinks that contain caffeine. These may  worsen some of your symptoms. How to manage cravings Come up with a plan for how to deal with your cravings. The plan should include the following: A definition of the specific situation you want to deal with. An activity or action you will take to replace smoking. A clear idea for how this action will help. The name of someone who could help you with this. Cravings usually last for 5-10 minutes. Consider taking the following actions to help you with your plan to deal with cravings: Keep your mouth busy. Chew sugar-free gum. Suck on hard candies or a straw. Brush your teeth. Keep your hands and body busy. Change to a different activity right away. Squeeze or play with a ball. Do an activity or a hobby, such as making bead jewelry, practicing needlepoint, or working with wood. Mix up your normal routine. Take a short exercise break. Go for a quick walk, or run up and down stairs. Focus on doing something kind or helpful for someone else. Call a friend or family member to talk during a craving. Join a support group. Contact a quitline. Where to find support To get help or find a support group: Call the [facility]'s Smoking Quitline: 1-800-QUIT-NOW (865) 002-8634) Text QUIT to SmokefreeTXT: 981191 Where to find more information Visit these websites to find more information on quitting smoking: U.S. Department of Health and Human Services: www.smokefree.gov American Lung Association: www.freedomfromsmoking.org Centers for Disease Control and Prevention (CDC): http://www.wolf.info/ American Heart Association: www.heart.org Contact a health care provider if: You want to change your plan for quitting. The medicines you are taking are not helping. Your eating feels out of control or you cannot sleep. You feel depressed or become very anxious. Summary Quitting smoking is a physical and mental challenge. You will face cravings, withdrawal symptoms, and temptation to smoke again.  Preparation can help you as you go through these challenges. Try different techniques to manage stress, handle social situations, and prevent weight gain. You can deal with cravings by keeping your mouth busy (such as by chewing gum), keeping your hands and body busy, calling family or friends, or contacting a quitline for people who want to quit smoking. You can deal with withdrawal symptoms by avoiding places where people smoke, getting plenty of rest, and avoiding drinks that contain caffeine. This information is not intended to replace advice given to you by your health care provider. Make sure you discuss any questions you have with your health care provider. Document Revised:  12/31/2020 Document Reviewed: 12/31/2020 Elsevier Patient Education  [place],   Merri Ray, MD [place], [facility] 08/05/21 12:35 PM

## 2021-08-11 ENCOUNTER — Telehealth: Payer: Self-pay | Admitting: Family Medicine

## 2021-08-11 NOTE — Telephone Encounter (Signed)
LM to ask when she is returning to work

## 2021-08-11 NOTE — Telephone Encounter (Signed)
Spoke with pt: states that she is returning to work 08/25/21.

## 2021-08-11 NOTE — Telephone Encounter (Signed)
Caller name: Arriel Victor   On DPR? :yes/no: Yes  Call back number: [phone]  Provider they see: Dr. Carlota Raspberry   Reason for call: Pt last appt was 08/05/21. Pt is calling stating that she need a return to work note. Pt states she forgot to ask for the note on her last office visit. Pt would like the note to be mailed to [address] [place] [id number].

## 2021-08-12 NOTE — Telephone Encounter (Signed)
Pt is asking for return to work note for 08/25/21 okay for me to provide this letter without restrictions?

## 2021-08-12 NOTE — Telephone Encounter (Signed)
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  If not, may need to return with some initial restrictions.

## 2021-08-12 NOTE — Progress Notes (Unsigned)
NEUROLOGY FOLLOW UP OFFICE NOTE  Lauren Chapman [id number]  Assessment/Plan:   1  Chronic migraine/migraine with aura, without status migrainosus, intractable - medication overuse contributing 2  OSA 3  Polypharmacy   Migraine prevention:  Vyepti '100mg'$  every 3 months Migraine rescue:  Zembrace SymTouch.  May take with Zofran and tizanidine.   Magnesium citrate '400mg'$  qd, riboflavin '400mg'$  qd, CoQ-10 '100mg'$  TID Discussed diet/caffeine cessation/exercise Continue use of CPAP Keep headache diary Follow up 6 months       Subjective:  Lauren Chapman is a 51 year old right-handed female with emphysema, Bipolar disorder, depression, anxiety, and history of tubular adenoma of the colon who follows up for migraines.   UPDATE: Started Vyepti and supplements.  Cut out the Excedrin.   Sumatriptan '6mg'$  [place] with Zofran and tizanidine.  Neck gets a little tight with the sumatriptan shot but manageable. Intensity:  3-4/10 Duration:  goes to bed for a 3-4 hours and wakes up without headache. Frequency:  6 in last 30 days Frequency of abortive medication: 6 days a month Current NSAIDS/analgesics:  ASA Current triptans:  sumatriptan '6mg'$   Current ergotamine:  none Current anti-emetic:  Zofran '4mg'$  Current muscle relaxants:  tizanidine '4mg'$  Current Antihypertensive medications:  none Current Antidepressant medications:  citalopram '20mg'$  daily Current Anticonvulsant medications:  gabapentin '800mg'$  three times daily, lamotrigine '150mg'$  BID Current anti-CGRP:  Emgality Current Vitamins/Herbal/Supplements:  magnesium citrate '400mg'$  daily, riboflavin '400mg'$  daily, CoQ-10 '100mg'$  TID.   Current Antihistamines/Decongestants:  none Other therapy:  none Hormone/birth control:  none Other medications:  Abilify, buspirone, benztropine  Caffeine:  3 pods a day of decaf coffee.  No longer drinks cola Smoking:  quit Diet:  Drinks Bubbly but no water.  Sometimes Sprite.  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  Eats junk food. Eats meat/chicken with starch vegetables/potatoes Exercise:  no Depression:  yes; Anxiety:  yes Other pain:  Chronic back pain, lumbar radicular pain.  Sleep hygiene:  Diagnosed with OSA in June.  On CPAP but still without rested sleep.  Sleepwalks.    HISTORY:  Onset:  51 years old Location:  left sided (frontal/retro-orbital) Quality:  pounding Intensity:  9-10/10.   Aura:  sometimes headaches preceded by visual aura (fuzzy vision/floaters in left eye) and phantosmia (metallic smell) Prodrome:  absent Associated symptoms:  Nausea, vomiting, photophobia, phonophobia, dizziness (once every 2 months).  She denies associated unilateral numbness or weakness. Duration:  1-2 days, sometimes 4-5 days (years ago would last 1-2 weeks) Frequency:  4 days a week Frequency of abortive medication: 5 days a week switches between sumatriptan and Excedrin Migraine Triggers/aggravating factors:  not eating, stress, worse when laying down Relieving factors:  nothing Activity:  aggravates   History of medication overuse leading to daily headaches in 2021. MRI of brain without contrast on 03/16/2020 personally reviewed revealed minimal nonspecific scattered hyperintense foci within the bilateral periventricular and subcortical white matter, stable compared to prior MRI from 02/06/2017.   History of chronic low back pain.  In 2021 reported worsening left lower back pain with radicular pain down the left leg causing left leg weakness.  Also endorsed bowel and bladder incontinence.  MRI of lumbar spine without contrast on 03/09/2020 personally reviewed showed no spinal or foraminal stenosis.  MRI of cervical spine on 03/09/2020 personally reviewed showed mild spondylosis and disc bulging from C3-4 to C6-7 with mild left foraminal stenosis at C4-5 but otherwise no spinal canal stenosis or foraminal stenosis.  MRI of thoracic spine on 03/16/2020 personally reviewed was  unremarkable.        Past NSAIDS/analgesics:  ibuprofen, naproxen, BC powder, diclofenac '75mg'$ , meloxicam, Excedrin Past abortive triptans:  rizatriptan '10mg'$ , sumatriptan '20mg'$  NS/tab Past abortive ergotamine:  none Past muscle relaxants:  methocarbamol, cyclobenzaprine Past anti-emetic:  none Past antihypertensive medications:  propranolol Past antidepressant medications:  amitriptyline, imipramine, sertraline, mirtazapine, Wellbutrin, vortioxetine, vilazodone Past anticonvulsant medications:  none Past anti-CGRP:  Aimovig, Ajovy, Emgality Past vitamins/Herbal/Supplements:  none Past antihistamines/decongestants:  Flonase Other past therapies:  Botox    Family history of headache:  daughters (migraines)  PAST MEDICAL HISTORY: Past Medical History:  Diagnosis Date   Allergy    Anxiety    Bipolar affective psychosis ([place])    Complication of anesthesia    " Acting silly- waving at everyone"-Giddy   Depression    Emphysema lung ([place])    inhaler prn,  followed by pcp and pulmonologist-- dr Margorie John   Family history of adverse reaction to anesthesia    Father had rheumatic fever, and died on operating table.   GERD (gastroesophageal reflux disease)    Kidney cysts 01/24/2020   Right Kidney   Migraine    Smokers' cough ([place])    per pt not productive   SUI (stress urinary incontinence, female)    Tubular adenoma of colon 07/08/2020   Wears glasses     MEDICATIONS: Current Outpatient Medications on File Prior to Visit  Medication Sig Dispense Refill   albuterol (VENTOLIN HFA) 108 (90 Base) MCG/ACT inhaler Inhale 2 puffs into the lungs every 4 (four) hours as needed for wheezing or shortness of breath. (Patient taking differently: Inhale 2 puffs into the lungs every hour as needed for wheezing or shortness of breath.) 1 each 1   ARIPiprazole (ABILIFY) 30 MG tablet Take 30 mg by mouth at bedtime.     benztropine (COGENTIN) 0.5 MG tablet Take 0.5 mg by mouth 2 (two) times daily.      budesonide-formoterol (SYMBICORT) 80-4.5 MCG/ACT inhaler Inhale 2 puffs into the lungs 2 (two) times daily. 1 each 0   busPIRone (BUSPAR) 15 MG tablet Take 15 mg by mouth 3 (three) times daily.     citalopram (CELEXA) 20 MG tablet Take 20 mg by mouth daily.     EPINEPHrine 0.3 mg/0.3 mL IJ SOAJ injection Inject 0.3 mg into the muscle as needed for anaphylaxis. 1 each 0   esomeprazole (NEXIUM) 20 MG capsule Take 1 capsule (20 mg total) by mouth 2 (two) times daily before a meal. 60 capsule 1   fluconazole (DIFLUCAN) 150 MG tablet Take 1 tablet by mouth once. If you are still experiencing symptoms 72 hours after taking, you may take 1 additional tablet. 2 tablet 0   gabapentin (NEURONTIN) 800 MG tablet Take 800 mg by mouth 3 (three) times daily.      guaiFENesin (MUCINEX) 600 MG 12 hr tablet Take 2 tablets (1,200 mg total) by mouth 2 (two) times daily. 30 tablet 0   lamoTRIgine (LAMICTAL) 150 MG tablet Take 150 mg by mouth 2 (two) times daily.      MYRBETRIQ 25 MG TB24 tablet Take 25 mg by mouth daily.     ondansetron (ZOFRAN) 4 MG tablet Take 1 tablet (4 mg total) by mouth every 8 (eight) hours as needed for nausea or vomiting. 20 tablet 6   perphenazine (TRILAFON) 4 MG tablet Take 4 mg by mouth at bedtime.     SUMAtriptan (IMITREX) 6 MG/0.5ML SOLN injection  Inject 6 mg into the skin See admin instructions. INJECT 6 MG (0.5 ml) INTO THE SKIN AS NEEDED FOR MIGRAINE OR HEADACHE. MAY REPEAT ONCE IN 2 HOURS IF HEADACHE PERSISTS OR RECURS. MAX 2 DOSES IN 24 HOURS.     tiZANidine (ZANAFLEX) 4 MG tablet Take 1 tablet (4 mg total) by mouth every 6 (six) hours as needed for muscle spasms. TAKE 1 TABLET BY MOUTH EVERY 6 HOURS AS NEEDED FOR MUSCLE SPASMS. (Patient taking differently: Take 4 mg by mouth every 6 (six) hours as needed for muscle spasms.) 30 tablet 6   No current facility-administered medications on file prior to visit.    ALLERGIES: Allergies  Allergen Reactions   Codeine Anaphylaxis and  Other (See Comments)    Cannot have ANYTHING with codeine    FAMILY HISTORY: Family History  Problem Relation Age of Onset   Depression Mother    Cancer Mother        Cervical   Alcohol abuse Father    Crohn's disease Sister    Stroke Brother 13   Migraines Daughter    GER disease Daughter    Depression Daughter    Migraines Daughter    GER disease Daughter    Colon cancer Neg Hx    Rectal cancer Neg Hx    Stomach cancer Neg Hx       Objective:  Blood pressure 94/66, pulse 90, height '5\' 3"'$  (1.6 m), weight 194 lb (88 kg), last menstrual period 05/02/2019, SpO2 97 %. General: No acute distress.  Patient appears well-groomed.      Metta Clines, DO  CC: Merri Ray, MD

## 2021-08-12 NOTE — Telephone Encounter (Signed)
Called pt and verified she states all sxs resolved, requested letter be mailed to her home. Letter completed and mailed

## 2021-08-15 ENCOUNTER — Encounter: Payer: Self-pay | Admitting: Neurology

## 2021-08-15 ENCOUNTER — Ambulatory Visit (INDEPENDENT_AMBULATORY_CARE_PROVIDER_SITE_OTHER): Payer: Medicare Other | Admitting: Neurology

## 2021-08-15 DIAGNOSIS — G43109 Migraine with aura, not intractable, without status migrainosus: Secondary | ICD-10-CM | POA: Diagnosis not present

## 2021-08-15 MED ORDER — TIZANIDINE HCL 4 MG PO TABS: 4.0000 mg | ORAL_TABLET | Freq: Four times a day (QID) | ORAL | 5 refills | Status: DC | PRN

## 2021-08-15 NOTE — Patient Instructions (Signed)
Continue Vyepti Continue magnesium citrate, riboflavin and CoQ-10 Take sumatriptan shot with zofran and tizanidine as needed. Limit use of pain relievers to no more than 2 days out of week to prevent risk of rebound or medication-overuse headache. Keep headache diary Follow up in 6 months.

## 2021-08-17 ENCOUNTER — Telehealth: Payer: Self-pay | Admitting: Family Medicine

## 2021-08-17 NOTE — Telephone Encounter (Signed)
Called pt return to work letter was sent out in the mail on 7/21 advised pt wait a few more days and then come to the office and pick up a hard copy if she still does not receive the letter, 2 copies placed up front so she may retain one and give one to her employer

## 2021-08-17 NOTE — Telephone Encounter (Signed)
Caller name: Casey (pt)  On DPR? :yes/no: Yes  Call back number:  [phone] Provider they see: Dr. Carlota Raspberry   Reason for call: Pt calling to get a return to work note; pt returning to work 08/25/21

## 2021-08-18 ENCOUNTER — Telehealth: Payer: Self-pay | Admitting: Nurse Practitioner

## 2021-08-18 DIAGNOSIS — J432 Centrilobular emphysema: Secondary | ICD-10-CM

## 2021-08-19 ENCOUNTER — Other Ambulatory Visit: Payer: Self-pay

## 2021-08-19 DIAGNOSIS — Z87891 Personal history of nicotine dependence: Secondary | ICD-10-CM

## 2021-08-19 DIAGNOSIS — Z122 Encounter for screening for malignant neoplasm of respiratory organs: Secondary | ICD-10-CM

## 2021-08-19 NOTE — Telephone Encounter (Signed)
Called and spoke to patient and advised her that the order was being placed to discontinue her oxygen. Nothing further needed

## 2021-08-19 NOTE — Telephone Encounter (Signed)
Walking oximetry during her OV without desaturation on ra; SpO2 low 96%. Ok to d/c supplemental O2. Thanks

## 2021-08-19 NOTE — Telephone Encounter (Signed)
Called patient and she states that she is no longer using her oxygen. She states that she has not used her oxygen in several weeks. She is wanting an order to discontinue her oxygen.  DME: [employer]  Please advise Katie

## 2021-09-02 ENCOUNTER — Encounter: Payer: Self-pay | Admitting: Acute Care

## 2021-09-02 ENCOUNTER — Ambulatory Visit (INDEPENDENT_AMBULATORY_CARE_PROVIDER_SITE_OTHER): Payer: Medicare Other | Admitting: Acute Care

## 2021-09-02 DIAGNOSIS — Z87891 Personal history of nicotine dependence: Secondary | ICD-10-CM | POA: Diagnosis not present

## 2021-09-02 NOTE — Progress Notes (Signed)
Virtual Visit via Telephone Note  I connected with Lauren Chapman on 12/07/20 at  2:00 PM EST by telephone and verified that I am speaking with the correct person using two identifiers.  Location: Patient: Home Provider: Working from home   I discussed the limitations, risks, security and privacy concerns of performing an evaluation and management service by telephone and the availability of in person appointments. I also discussed with the patient that there may be a patient responsible charge related to this service. The patient expressed understanding and agreed to proceed.  Shared Decision Making Visit Lung Cancer Screening Program [phone])   Eligibility: Age 51 y.o. Pack Years Smoking History Calculation 39 (# packs/per year x # years smoked) Recent History of coughing up blood  no Unexplained weight loss? no ( >Than 15 pounds within the last 6 months ) Prior History Lung / other cancer no (Diagnosis within the last 5 years already requiring surveillance chest CT Scans). Smoking Status Former Smoker Former Smokers: Years since quit: < 1 year  Quit Date: 2023  Visit Components: Discussion included one or more decision making aids. yes Discussion included risk/benefits of screening. yes Discussion included potential follow up diagnostic testing for abnormal scans. yes Discussion included meaning and risk of over diagnosis. yes Discussion included meaning and risk of False Positives. yes Discussion included meaning of total radiation exposure. yes  Counseling Included: Importance of adherence to annual lung cancer LDCT screening. yes Impact of comorbidities on ability to participate in the program. yes Ability and willingness to under diagnostic treatment. yes  Smoking Cessation Counseling: Current Smokers:  Discussed importance of smoking cessation. yes Information about tobacco cessation classes and interventions provided to patient. yes Patient provided with "ticket"  for LDCT Scan. yes Symptomatic Patient. yes  Counseling(Intermediate counseling: > three minutes) [address]406 Diagnosis Code: Tobacco Use Z72.0 Asymptomatic Patient no  Counseling NA Former Smokers:  Discussed the importance of maintaining cigarette abstinence. yes Diagnosis Code: Personal History of Nicotine Dependence. G83.662 Information about tobacco cessation classes and interventions provided to patient. Yes Patient provided with "ticket" for LDCT Scan. yes Written Order for Lung Cancer Screening with LDCT placed in Epic. Yes (CT Chest Lung Cancer Screening Low Dose W/O CM) [id number] Z12.2-Screening of respiratory organs Z87.891-Personal history of nicotine dependence   I spent 25 minutes of face to face time with her discussing the risks and benefits of lung cancer screening. We viewed a power point together that explained in detail the above noted topics. We took the time to pause the power point at intervals to allow for questions to be asked and answered to ensure understanding. We discussed that she had taken the single most powerful action possible to decrease her risk of developing lung cancer when she quit smoking. I counseled her to remain smoke free, and to contact me if she ever had the desire to smoke again so that I can provide resources and tools to help support the effort to remain smoke free. We discussed the time and location of the scan, and that either  Doroteo Glassman RN or I will call with the results within  24-48 hours of receiving them. She has my card and contact information in the event she needs to speak with me, in addition to a copy of the power point we reviewed as a resource. She verbalized understanding of all of the above and had no further questions upon leaving the office.     I explained to the patient that there has  been a high incidence of coronary artery disease noted on these exams. I explained that this is a non-gated exam therefore degree or severity cannot  be determined. This patient is not on statin therapy. I have asked the patient to follow-up with their PCP regarding any incidental finding of coronary artery disease and management with diet or medication as they feel is clinically indicated. The patient verbalized understanding of the above and had no further questions.    I spent 3 minutes counseling on smoking cessation and the health risks of continued tobacco abuse    Tc Kapusta D. Kenton Kingfisher, NP-C [facility] Personal contact information can be found on Amion  09/02/2021, 10:40 AM

## 2021-09-02 NOTE — Patient Instructions (Signed)
Thank you for participating in the [facility] Screening Program. It was our pleasure to meet you today. We will call you with the results of your scan within the next few days. Your scan will be assigned a Lung RADS category score by the physicians reading the scans.  This Lung RADS score determines follow up scanning.  See below for description of categories, and follow up screening recommendations. We will be in touch to schedule your follow up screening annually or based on recommendations of our providers. We will fax a copy of your scan results to your Primary Care Physician, or the physician who referred you to the program, to ensure they have the results. Please call the office if you have any questions or concerns regarding your scanning experience or results.  Our office number is [phone]. Please speak with Denise Phelps, RN. , or  Denise Buckner RN, They are  our Lung Cancer Screening RN.'s If They are unavailable when you call, Please leave a message on the voice mail. We will return your call at our earliest convenience.This voice mail is monitored several times a day.  Remember, if your scan is normal, we will scan you annually as long as you continue to meet the criteria for the program. (Age 55-77, Current smoker or smoker who has quit within the last 15 years). If you are a smoker, remember, quitting is the single most powerful action that you can take to decrease your risk of lung cancer and other pulmonary, breathing related problems. We know quitting is hard, and we are here to help.  Please let us know if there is anything we can do to help you meet your goal of quitting. If you are a former smoker, congratulations. We are proud of you! Remain smoke free! Remember you can refer friends or family members through the number above.  We will screen them to make sure they meet criteria for the program. Thank you for helping us take better care of you by  participating in Lung Screening.  You can receive free nicotine replacement therapy ( patches, gum or mints) by calling 1-800-QUIT NOW. Please call so we can get you on the path to becoming  a non-smoker. I know it is hard, but you can do this!  Lung RADS Categories:  Lung RADS 1: no nodules or definitely non-concerning nodules.  Recommendation is for a repeat annual scan in 12 months.  Lung RADS 2:  nodules that are non-concerning in appearance and behavior with a very low likelihood of becoming an active cancer. Recommendation is for a repeat annual scan in 12 months.  Lung RADS 3: nodules that are probably non-concerning , includes nodules with a low likelihood of becoming an active cancer.  Recommendation is for a 6-month repeat screening scan. Often noted after an upper respiratory illness. We will be in touch to make sure you have no questions, and to schedule your 6-month scan.  Lung RADS 4 A: nodules with concerning findings, recommendation is most often for a follow up scan in 3 months or additional testing based on our provider's assessment of the scan. We will be in touch to make sure you have no questions and to schedule the recommended 3 month follow up scan.  Lung RADS 4 B:  indicates findings that are concerning. We will be in touch with you to schedule additional diagnostic testing based on our provider's  assessment of the scan.  Other options for assistance in smoking cessation (   As covered by your insurance benefits)  Hypnosis for smoking cessation  [employer] [phone]  Acupuncture for smoking cessation  [facility] [phone]   

## 2021-09-05 ENCOUNTER — Ambulatory Visit (INDEPENDENT_AMBULATORY_CARE_PROVIDER_SITE_OTHER): Payer: Medicare Other | Admitting: Pulmonary Disease

## 2021-09-05 ENCOUNTER — Ambulatory Visit (INDEPENDENT_AMBULATORY_CARE_PROVIDER_SITE_OTHER): Payer: Medicare Other | Admitting: Nurse Practitioner

## 2021-09-05 ENCOUNTER — Encounter: Payer: Self-pay | Admitting: Nurse Practitioner

## 2021-09-05 VITALS — BP 114/80 | HR 79 | Temp 98.3°F | Ht 63.0 in | Wt 199.2 lb

## 2021-09-05 DIAGNOSIS — J4540 Moderate persistent asthma, uncomplicated: Secondary | ICD-10-CM | POA: Diagnosis not present

## 2021-09-05 DIAGNOSIS — J438 Other emphysema: Secondary | ICD-10-CM

## 2021-09-05 DIAGNOSIS — R0602 Shortness of breath: Secondary | ICD-10-CM | POA: Diagnosis not present

## 2021-09-05 DIAGNOSIS — J9601 Acute respiratory failure with hypoxia: Secondary | ICD-10-CM

## 2021-09-05 DIAGNOSIS — J189 Pneumonia, unspecified organism: Secondary | ICD-10-CM | POA: Diagnosis not present

## 2021-09-05 DIAGNOSIS — Z720 Tobacco use: Secondary | ICD-10-CM

## 2021-09-05 DIAGNOSIS — J432 Centrilobular emphysema: Secondary | ICD-10-CM

## 2021-09-05 DIAGNOSIS — J4530 Mild persistent asthma, uncomplicated: Secondary | ICD-10-CM | POA: Insufficient documentation

## 2021-09-05 HISTORY — DX: Moderate persistent asthma, uncomplicated: J45.40

## 2021-09-05 LAB — PULMONARY FUNCTION TEST
DL/VA % pred: 91 %
DL/VA: 3.93 ml/min/mmHg/L
DLCO cor % pred: 76 %
DLCO cor: 15.55 ml/min/mmHg
DLCO unc % pred: 76 %
DLCO unc: 15.6 ml/min/mmHg
FEF 25-75 Post: 3.01 L/sec
FEF 25-75 Pre: 1.57 L/sec
FEF2575-%Change-Post: 91 %
FEF2575-%Pred-Post: 115 %
FEF2575-%Pred-Pre: 60 %
FEV1-%Change-Post: 21 %
FEV1-%Pred-Post: 71 %
FEV1-%Pred-Pre: 58 %
FEV1-Post: 1.91 L
FEV1-Pre: 1.58 L
FEV1FVC-%Change-Post: 0 %
FEV1FVC-%Pred-Pre: 102 %
FEV6-%Change-Post: 19 %
FEV6-%Pred-Post: 69 %
FEV6-%Pred-Pre: 58 %
FEV6-Post: 2.31 L
FEV6-Pre: 1.93 L
FEV6FVC-%Pred-Post: 102 %
FEV6FVC-%Pred-Pre: 102 %
FVC-%Change-Post: 20 %
FVC-%Pred-Post: 67 %
FVC-%Pred-Pre: 56 %
FVC-Post: 2.32 L
FVC-Pre: 1.93 L
Post FEV1/FVC ratio: 82 %
Post FEV6/FVC ratio: 100 %
Pre FEV1/FVC ratio: 82 %
Pre FEV6/FVC Ratio: 100 %
RV % pred: 110 %
RV: 2.01 L
TLC % pred: 83 %
TLC: 4.17 L

## 2021-09-05 MED ORDER — BUDESONIDE-FORMOTEROL FUMARATE 160-4.5 MCG/ACT IN AERO: 2.0000 | INHALATION_SPRAY | Freq: Two times a day (BID) | RESPIRATORY_TRACT | 6 refills | Status: DC

## 2021-09-05 NOTE — Progress Notes (Signed)
PFT done today. 

## 2021-09-05 NOTE — Patient Instructions (Addendum)
Continue Albuterol inhaler 2 puffs or 3 mL neb every 6 hours as needed for shortness of breath or wheezing. Notify if symptoms persist despite rescue inhaler/neb use. Continue nexium 20 mg Twice daily  Continue nicotine patches through your insurance company  Restart Symbicort 2 puffs Twice daily. Brush tongue and rinse mouth afterwards   Follow up in 3 months with Dr Ander Slade or Alanson Aly. If symptoms do not improve or worsen, please contact office for sooner follow up or seek emergency care.

## 2021-09-05 NOTE — Assessment & Plan Note (Signed)
She completed pulmonary function testing today which showed a moderate obstruction with significant bronchodilator response with 21% change in FEV1, consistent with asthma diagnosis.  Ratio was 82, which we discussed is not diagnostic of COPD.  She is using her albuterol multiple times a week as maintenance right now.  Recommended that we restart her on ICS/LABA therapy with Symbicort.  She was agreeable to this plan -new Rx sent today.  Continue as needed albuterol.  Patient Instructions  Continue Albuterol inhaler 2 puffs or 3 mL neb every 6 hours as needed for shortness of breath or wheezing. Notify if symptoms persist despite rescue inhaler/neb use. Continue nexium 20 mg Twice daily  Continue nicotine patches through your insurance company  Restart Symbicort 2 puffs Twice daily. Brush tongue and rinse mouth afterwards   Follow up in 3 months with Dr Ander Slade or Alanson Aly. If symptoms do not improve or worsen, please contact office for sooner follow up or seek emergency care.

## 2021-09-05 NOTE — Assessment & Plan Note (Signed)
Obstruction with reversibility on PFTs. No formal diagnosis of COPD given ratio >70. She does have a mild diffusion defect, consistent with emphysema. Discussed the role of maintenance inhalers in both asthma and emphysema. We will restart her on ICS/LABA. Do not think she needs triple therapy at this point. She was counseled to remain smoke free.

## 2021-09-05 NOTE — Assessment & Plan Note (Signed)
She was hospitalized from 6/26 to 7/1 due to acute respiratory failure related to CAP.  She was treated with IV antibiotics and has recovered well. We were going to repeat her chest x-ray today to ensure radiographic resolution; however, she has CT chest in 2 days so we will wait for this since she is clinically improved.

## 2021-09-05 NOTE — Assessment & Plan Note (Signed)
Referred to lung cancer screening program at last OV. CT scheduled for 8/16.

## 2021-09-05 NOTE — Progress Notes (Addendum)
$'@Patient'D$  ID: Lauren Chapman, female    DOB: 04-Feb-1970, 51 y.o.   MRN: [id number]  Chief Complaint  Patient presents with   Follow-up    Referring provider: Wendie Agreste, MD  HPI: 51 year old female, former smoker (Quit June 2023) followed for shortness of breath and emphysema.  She is a patient of Dr. Judson Roch and last seen in office 08/02/2021 by [name] NP. Past medical history significant for bipolar disorder, OSA on CPAP, obesity, migraines, GERD, anxiety.  She was recently hospitalized for acute hypoxic respiratory failure related to CAP from 07/18/2021 to 07/23/2021.  COVID/flu negative.  She was treated with Rocephin and Zithromax along with IV Solu-Medrol.  She was discharged on supplemental oxygen at 2 to 4 L and prednisone 40 mg for 7 days.  She was also prescribed Symbicort for maintenance and advised to follow-up with pulmonary outpatient.  TEST/EVENTS:  09/09/2018 PFTs: FVC 82, FEV1 84, ratio 82, TLC 102, DLCOcor 88. No BD. Normal PFTs 07/18/2021 CXR: questionable mild lingular pneumonia; clear upper lobes 07/31/2021 CXR 1 view: subtle opacity at lateral left base, similar to prior and likely atelectasis, scarring or resolving pna.  09/05/2021 PFTs: FVC 56, FEV1 58, ratio 82, TLC 83, DLCOcor 76. +21% BD response. Moderate obstruction with reversibility   08/02/2021: OV with Judyth Demarais NP for hospital follow up. She reports that she has been slowly recovering since being discharged. She is still feeling a little fatigued and has an occasional congested, non-productive cough; however, both of these are improving. She was able to come off of her oxygen and has been in the 90's on room air. Her breathing feels much better on Symbicort and she hasn't had to use her albuterol but 2-3 times since being discharged. Prior to admission, she was using it almost daily. She denies any fevers, night sweats, hemoptysis, anorexia, weight loss, wheezing. She has quit smoking and is currently using nicotine  patches provided by her insurance to help her remain smoke free. She is taking mucinex for chest congestion/cough and using her Symbicort Twice daily.  PFTs ordered for further evaluation.  Referred to lung cancer screening program. She did go back to the ED on 7/9 after a syncopal episode. She had developed some joint pain and headache prior to the event. Respiratory status was stable with O2 98% on room air and ED workup was unremarkable. Suspect related to orthostatic or vasovagal syncope and possible from dehydration - treated with IV fluids and headache cocktail and discharged home. She reports feeling better since with no recurrent episodes. She has follow up scheduled with her PCP on Friday.  She is experiencing some itching and discomfort in her vaginal area and is concerned she may have developed a yeast infection from the antibiotics she was on. She has had a small amount of discharge. No odor, bleeding or pain. No urinary symptoms.  Treated with fluconazole x1 and instructed to follow-up with PCP if symptoms do not improve.  09/05/2021: Today-follow-up Patient presents today for follow-up after undergoing pulmonary function testing.  She reports that she has been doing well since she was seen last.  Feels like all of her symptoms have resolved.  She no longer has a cough and the chest congestion has gone away.  Breathing feels like it is relatively stable.  She did stop using Symbicort and is using albuterol every other day as she still gets winded with some activities.  Does not notice any significant wheezing.  Does also occasionally have some chest  tightness with the shortness of breath.  Denies any lower extremity swelling, hemoptysis, fevers, night sweats, palpitations.  She has upcoming CT chest in 2 days for lung cancer screening.  No concerns or complaints today.  Allergies  Allergen Reactions   Codeine Anaphylaxis and Other (See Comments)    Cannot have ANYTHING with codeine     Immunization History  Administered Date(s) Administered   Influenza Inj Mdck Quad Pf 01/03/2019   Influenza Split 10/11/2011   Influenza,inj,Quad PF,6+ Mos 12/04/2017, 12/01/2019   Influenza-Unspecified 01/03/2019   PFIZER(Purple Top)SARS-COV-2 Vaccination 04/19/2019, 05/10/2019, 01/22/2020, 10/26/2020   PNEUMOCOCCAL CONJUGATE-20 08/02/2020   Tdap 10/16/2018   Zoster Recombinat (Shingrix) 05/31/2020, 08/02/2020    Past Medical History:  Diagnosis Date   Allergy    Anxiety    Bipolar affective psychosis ([place])    Complication of anesthesia    " Acting silly- waving at everyone"-Giddy   Depression    Emphysema lung ([place])    inhaler prn,  followed by pcp and pulmonologist-- dr Margorie John   Family history of adverse reaction to anesthesia    Father had rheumatic fever, and died on operating table.   GERD (gastroesophageal reflux disease)    Kidney cysts 01/24/2020   Right Kidney   Migraine    Moderate persistent asthma 09/05/2021   Smokers' cough ([place])    per pt not productive   SUI (stress urinary incontinence, female)    Tubular adenoma of colon 07/08/2020   Wears glasses     Tobacco History: Social History   Tobacco Use  Smoking Status Former   Packs/day: 1.00   Years: 39.00   Total pack years: 39.00   Types: Cigarettes   Start date: 10/10/1981   Quit date: 07/18/2021   Years since quitting: 0.1  Smokeless Tobacco Never   Counseling given: Not Answered   Outpatient Medications Prior to Visit  Medication Sig Dispense Refill   albuterol (VENTOLIN HFA) 108 (90 Base) MCG/ACT inhaler Inhale 2 puffs into the lungs every 4 (four) hours as needed for wheezing or shortness of breath. (Patient taking differently: Inhale 2 puffs into the lungs every hour as needed for wheezing or shortness of breath.) 1 each 1   ARIPiprazole (ABILIFY) 30 MG tablet Take 30 mg by mouth at bedtime.     benztropine (COGENTIN) 0.5 MG tablet Take 0.5 mg by mouth 2 (two) times daily.      busPIRone (BUSPAR) 15 MG tablet Take 15 mg by mouth 3 (three) times daily.     citalopram (CELEXA) 20 MG tablet Take 30 mg by mouth daily.     EPINEPHrine 0.3 mg/0.3 mL IJ SOAJ injection Inject 0.3 mg into the muscle as needed for anaphylaxis. 1 each 0   esomeprazole (NEXIUM) 20 MG capsule Take 1 capsule (20 mg total) by mouth 2 (two) times daily before a meal. 60 capsule 1   gabapentin (NEURONTIN) 800 MG tablet Take 800 mg by mouth 3 (three) times daily.      lamoTRIgine (LAMICTAL) 150 MG tablet Take 150 mg by mouth 2 (two) times daily.      MYRBETRIQ 25 MG TB24 tablet Take 25 mg by mouth daily.     ondansetron (ZOFRAN) 4 MG tablet Take 1 tablet (4 mg total) by mouth every 8 (eight) hours as needed for nausea or vomiting. 20 tablet 6   perphenazine (TRILAFON) 4 MG tablet Take 4 mg by mouth at bedtime.     SUMAtriptan (IMITREX) 6 MG/0.5ML SOLN injection Inject 6 mg into  the skin See admin instructions. INJECT 6 MG (0.5 ml) INTO THE SKIN AS NEEDED FOR MIGRAINE OR HEADACHE. MAY REPEAT ONCE IN 2 HOURS IF HEADACHE PERSISTS OR RECURS. MAX 2 DOSES IN 24 HOURS.     tiZANidine (ZANAFLEX) 4 MG tablet Take 1 tablet (4 mg total) by mouth every 6 (six) hours as needed for muscle spasms. 30 tablet 5   budesonide-formoterol (SYMBICORT) 80-4.5 MCG/ACT inhaler Inhale 2 puffs into the lungs 2 (two) times daily. 1 each 0   fluconazole (DIFLUCAN) 150 MG tablet Take 1 tablet by mouth once. If you are still experiencing symptoms 72 hours after taking, you may take 1 additional tablet. (Patient not taking: Reported on 08/15/2021) 2 tablet 0   guaiFENesin (MUCINEX) 600 MG 12 hr tablet Take 2 tablets (1,200 mg total) by mouth 2 (two) times daily. (Patient not taking: Reported on 08/15/2021) 30 tablet 0   No facility-administered medications prior to visit.     Review of Systems:   Constitutional: No weight loss or gain, night sweats, fevers, chills, fatigue, lassitude HEENT: No headaches, difficulty swallowing,  tooth/dental problems, or sore throat. No sneezing, itching, ear ache, nasal congestion, or post nasal drip CV:  No chest pain, orthopnea, PND, swelling in lower extremities, anasarca, dizziness, palpitations, syncope Resp: +shortness of breath with exertion. No cough. No excess mucus or change in color of mucus. No hemoptysis. No wheezing.  No chest wall deformity GU: No dysuria, change in color of urine, urgency or frequency.  No flank pain, no hematuria  Skin: No rash, lesions, ulcerations MSK:  No joint pain or swelling.  No decreased range of motion.  No back pain. Neuro: No dizziness or lightheadedness.  Psych: No depression or anxiety. Mood stable.     Physical Exam:  BP 114/80 (BP Location: Right Arm, Patient Position: Sitting, Cuff Size: Normal)   Pulse 79   Temp 98.3 F (36.8 C) (Oral)   Ht '5\' 3"'$  (1.6 m)   Wt 199 lb 3.2 oz (90.4 kg)   LMP 05/02/2019 Comment: irregular cycles  SpO2 94%   BMI 35.29 kg/m   GEN: Pleasant, interactive, well-appearing; obese; in no acute distress. HEENT:  Normocephalic and atraumatic. PERRLA. Sclera white. Nasal turbinates pink, moist and patent bilaterally. No rhinorrhea present. Oropharynx pink and moist, without exudate or edema. No lesions, ulcerations, or postnasal drip.  NECK:  Supple w/ fair ROM. No JVD present. Normal carotid impulses w/o bruits. Thyroid symmetrical with no goiter or nodules palpated. No lymphadenopathy.   CV: RRR, no m/r/g, no peripheral edema. Pulses intact, +2 bilaterally. No cyanosis, pallor or clubbing. PULMONARY:  Unlabored, regular breathing. Clear bilaterally A&P w/o wheezes/rales/rhonchi. No accessory muscle use. No dullness to percussion. GI: BS present and normoactive. Soft, non-tender to palpation. No organomegaly or masses detected. No CVA tenderness. GU: Deferred by pt. MSK: No erythema, warmth or tenderness. Cap refil <2 sec all extrem. No deformities or joint swelling noted.  Neuro: A/Ox3. No focal deficits  noted.   Skin: Warm, no lesions or rashe Psych: Normal affect and behavior. Judgement and thought content appropriate.     Lab Results:  CBC    Component Value Date/Time   WBC 10.1 08/05/2021 1242   RBC 4.42 08/05/2021 1242   HGB 13.5 08/05/2021 1242   HGB 15.2 03/24/2019 1510   HCT 41.4 08/05/2021 1242   HCT 44.8 03/24/2019 1510   PLT 219.0 08/05/2021 1242   PLT 241 03/24/2019 1510   MCV 93.5 08/05/2021 1242   MCV  94 03/24/2019 1510   MCH 31.1 07/31/2021 1419   MCHC 32.6 08/05/2021 1242   RDW 14.4 08/05/2021 1242   RDW 13.1 03/24/2019 1510   LYMPHSABS 3.6 07/31/2021 1419   LYMPHSABS 2.6 03/24/2019 1510   MONOABS 0.4 07/31/2021 1419   EOSABS 0.0 07/31/2021 1419   EOSABS 0.2 03/24/2019 1510   BASOSABS 0.0 07/31/2021 1419   BASOSABS 0.1 03/24/2019 1510    BMET    Component Value Date/Time   NA 138 08/05/2021 1242   NA 143 03/24/2019 1510   K 5.1 08/05/2021 1242   CL 101 08/05/2021 1242   CO2 30 08/05/2021 1242   GLUCOSE 92 08/05/2021 1242   BUN 13 08/05/2021 1242   BUN 10 03/24/2019 1510   CREATININE 1.09 08/05/2021 1242   CREATININE 1.12 (H) 08/17/2015 1608   CALCIUM 9.4 08/05/2021 1242   GFRNONAA >60 07/31/2021 1419   GFRAA 71 03/24/2019 1510    BNP No results found for: "BNP"   Imaging:  No results found.        Latest Ref Rng & Units 09/05/2021   10:37 AM 09/09/2018    2:07 PM  PFT Results  FVC-Pre L 1.93  P 2.91   FVC-Predicted Pre % 56  P 82   FVC-Post L 2.32  P 3.04   FVC-Predicted Post % 67  P 85   Pre FEV1/FVC % % 82  P 81   Post FEV1/FCV % % 82  P 82   FEV1-Pre L 1.58  P 2.36   FEV1-Predicted Pre % 58  P 84   FEV1-Post L 1.91  P 2.50   DLCO uncorrected ml/min/mmHg 15.60  P 19.28   DLCO UNC% % 76  P 91   DLCO corrected ml/min/mmHg 15.55  P 18.63   DLCO COR %Predicted % 76  P 88   DLVA Predicted % 91  P 102   TLC L 4.17  P 5.18   TLC % Predicted % 83  P 102   RV % Predicted % 110  P 130     P Preliminary result    No results  found for: "NITRICOXIDE"      Assessment & Plan:   Moderate persistent asthma She completed pulmonary function testing today which showed a moderate obstruction with significant bronchodilator response with 21% change in FEV1, consistent with asthma diagnosis.  Ratio was 82, which we discussed is not diagnostic of COPD.  She is using her albuterol multiple times a week as maintenance right now.  Recommended that we restart her on ICS/LABA therapy with Symbicort.  She was agreeable to this plan -new Rx sent today.  Continue as needed albuterol.  Patient Instructions  Continue Albuterol inhaler 2 puffs or 3 mL neb every 6 hours as needed for shortness of breath or wheezing. Notify if symptoms persist despite rescue inhaler/neb use. Continue nexium 20 mg Twice daily  Continue nicotine patches through your insurance company  Restart Symbicort 2 puffs Twice daily. Brush tongue and rinse mouth afterwards   Follow up in 3 months with Dr Ander Slade or Alanson Aly. If symptoms do not improve or worsen, please contact office for sooner follow up or seek emergency care.     CAP (community acquired pneumonia) She was hospitalized from 6/26 to 7/1 due to acute respiratory failure related to CAP.  She was treated with IV antibiotics and has recovered well. We were going to repeat her chest x-ray today to ensure radiographic resolution; however, she has CT chest  in 2 days so we will wait for this since she is clinically improved.   Emphysema, unspecified ([place]) Obstruction with reversibility on PFTs. No formal diagnosis of COPD given ratio >70. She does have a mild diffusion defect, consistent with emphysema. Discussed the role of maintenance inhalers in both asthma and emphysema. We will restart her on ICS/LABA. Do not think she needs triple therapy at this point. She was counseled to remain smoke free.   Tobacco abuse Referred to lung cancer screening program at last OV. CT scheduled for 8/16.      I spent 35 minutes of dedicated to the care of this patient on the date of this encounter to include pre-visit review of records, face-to-face time with the patient discussing conditions above, post visit ordering of testing, clinical documentation with the electronic health record, making appropriate referrals as documented, and communicating necessary findings to members of the patients care team.  Clayton Bibles, NP 09/05/2021  Pt aware and understands NP's role.

## 2021-09-07 ENCOUNTER — Ambulatory Visit (HOSPITAL_BASED_OUTPATIENT_CLINIC_OR_DEPARTMENT_OTHER)
Admission: RE | Admit: 2021-09-07 | Discharge: 2021-09-07 | Disposition: A | Discharge status: Home / Self Care | Payer: Medicare Other | Source: Ambulatory Visit | Attending: Acute Care | Admitting: Acute Care

## 2021-09-07 DIAGNOSIS — Z87891 Personal history of nicotine dependence: Secondary | ICD-10-CM | POA: Insufficient documentation

## 2021-09-07 DIAGNOSIS — Z122 Encounter for screening for malignant neoplasm of respiratory organs: Secondary | ICD-10-CM | POA: Insufficient documentation

## 2021-09-09 ENCOUNTER — Other Ambulatory Visit: Payer: Self-pay | Admitting: Acute Care

## 2021-09-09 DIAGNOSIS — Z122 Encounter for screening for malignant neoplasm of respiratory organs: Secondary | ICD-10-CM

## 2021-09-09 DIAGNOSIS — Z87891 Personal history of nicotine dependence: Secondary | ICD-10-CM

## 2021-09-21 ENCOUNTER — Telehealth: Payer: Self-pay | Admitting: Family Medicine

## 2021-09-21 NOTE — Telephone Encounter (Signed)
Left message for patient to call back and schedule Medicare Annual Wellness Visit (AWV).   Please offer to do virtually or by telephone.  Left office number and my jabber [phone].  Last AWV:03/17/2019  Please schedule at anytime with Nurse Health Advisor.

## 2021-09-23 ENCOUNTER — Ambulatory Visit (INDEPENDENT_AMBULATORY_CARE_PROVIDER_SITE_OTHER): Payer: Medicare Other | Admitting: *Deleted

## 2021-09-23 VITALS — BP 96/60 | HR 75 | Temp 97.6°F | Resp 16 | Ht 63.0 in | Wt 203.0 lb

## 2021-09-23 DIAGNOSIS — G43709 Chronic migraine without aura, not intractable, without status migrainosus: Secondary | ICD-10-CM | POA: Diagnosis not present

## 2021-09-23 MED ORDER — SODIUM CHLORIDE 0.9 % IV SOLN
100.0000 mg | Freq: Once | INTRAVENOUS | Status: AC
  Administered 2021-09-23: 100 mg via INTRAVENOUS
  Filled 2021-09-23: qty 1

## 2021-09-23 NOTE — Progress Notes (Signed)
Diagnosis: Migraine  Provider:  Marshell Garfinkel MD  Procedure: Infusion  IV Type: Peripheral, IV Location: L Hand  Vyepti (Eptinezumab-jjmr), Dose: 100 mg  Infusion Start Time: 6148 am  Infusion Stop Time: 1120 am  Post Infusion IV Care: Observation period completed and Peripheral IV Discontinued  Discharge: Condition: Good, Destination: Home . AVS provided to patient.   Performed by:  Oren Beckmann, RN

## 2021-10-19 ENCOUNTER — Ambulatory Visit: Payer: Medicare Other | Admitting: Podiatry

## 2021-10-20 ENCOUNTER — Encounter: Payer: Self-pay | Admitting: Podiatry

## 2021-10-20 ENCOUNTER — Ambulatory Visit (INDEPENDENT_AMBULATORY_CARE_PROVIDER_SITE_OTHER): Payer: Medicare Other | Admitting: Podiatry

## 2021-10-20 DIAGNOSIS — M7751 Other enthesopathy of right foot: Secondary | ICD-10-CM

## 2021-10-20 DIAGNOSIS — M205X1 Other deformities of toe(s) (acquired), right foot: Secondary | ICD-10-CM

## 2021-10-20 NOTE — Progress Notes (Signed)
Subjective:   Patient ID: Lauren Chapman, female   DOB: 51 y.o.   MRN: [id number]   HPI Patient states she has developed pain again in the big toe joint right stating that it improved but now is quite sore again making it hard to be ambulatory.  States that she has tried to reduce her activities   ROS      Objective:  Physical Exam  Neurovascular status intact inflammation pain around the first MPJ right diminished range of motion of the joint no crepitus of the joint noted      Assessment:  Inflammatory capsulitis first MPJ right with hallux limitus deformity that did well for several months but has reoccurred     Plan:  H&P reviewed condition discussed possible surgical intervention.  Today I went ahead did sterile prep injected periarticular first MPJ 3 mg Kenalog 5 mg Xylocaine advised on rigid bottom shoes reappoint to recheck and may require biplanar type osteotomy with possible fusion or joint implant

## 2021-10-30 ENCOUNTER — Encounter (HOSPITAL_COMMUNITY): Payer: Self-pay

## 2021-10-30 ENCOUNTER — Emergency Department (HOSPITAL_COMMUNITY)
Admission: EM | Admit: 2021-10-30 | Discharge: 2021-10-30 | Disposition: A | Discharge status: Home / Self Care | Payer: Medicare Other | Attending: Emergency Medicine | Admitting: Emergency Medicine

## 2021-10-30 DIAGNOSIS — R1110 Vomiting, unspecified: Secondary | ICD-10-CM | POA: Insufficient documentation

## 2021-10-30 DIAGNOSIS — Z20822 Contact with and (suspected) exposure to covid-19: Secondary | ICD-10-CM | POA: Diagnosis not present

## 2021-10-30 DIAGNOSIS — R519 Headache, unspecified: Secondary | ICD-10-CM | POA: Diagnosis present

## 2021-10-30 DIAGNOSIS — H53149 Visual discomfort, unspecified: Secondary | ICD-10-CM | POA: Insufficient documentation

## 2021-10-30 DIAGNOSIS — G43809 Other migraine, not intractable, without status migrainosus: Secondary | ICD-10-CM | POA: Insufficient documentation

## 2021-10-30 LAB — COMPREHENSIVE METABOLIC PANEL
ALT: 25 U/L (ref 0–44)
AST: 22 U/L (ref 15–41)
Albumin: 3.9 g/dL (ref 3.5–5.0)
Alkaline Phosphatase: 75 U/L (ref 38–126)
Anion gap: 6 (ref 5–15)
BUN: 15 mg/dL (ref 6–20)
CO2: 26 mmol/L (ref 22–32)
Calcium: 9 mg/dL (ref 8.9–10.3)
Chloride: 105 mmol/L (ref 98–111)
Creatinine, Ser: 1 mg/dL (ref 0.44–1.00)
GFR, Estimated: >60 mL/min (ref >60)
Glucose, Bld: 135 mg/dL — ABNORMAL HIGH (ref 70–99)
Potassium: 3.9 mmol/L (ref 3.5–5.1)
Sodium: 137 mmol/L (ref 135–145)
Total Bilirubin: 0.4 mg/dL (ref 0.3–1.2)
Total Protein: 7 g/dL (ref 6.5–8.1)

## 2021-10-30 LAB — LIPASE, BLOOD: Lipase: 36 U/L (ref 11–51)

## 2021-10-30 LAB — CBC WITH DIFFERENTIAL/PLATELET
Abs Immature Granulocytes: 0.03 10*3/uL (ref 0.00–0.07)
Basophils Absolute: 0 10*3/uL (ref 0.0–0.1)
Basophils Relative: 0 %
Eosinophils Absolute: 0.1 10*3/uL (ref 0.0–0.5)
Eosinophils Relative: 1 %
HCT: 37.5 % (ref 36.0–46.0)
Hemoglobin: 12.4 g/dL (ref 12.0–15.0)
Immature Granulocytes: 0 %
Lymphocytes Relative: 25 %
Lymphs Abs: 2.3 10*3/uL (ref 0.7–4.0)
MCH: 29.8 pg (ref 26.0–34.0)
MCHC: 33.1 g/dL (ref 30.0–36.0)
MCV: 90.1 fL (ref 80.0–100.0)
Monocytes Absolute: 0.4 10*3/uL (ref 0.1–1.0)
Monocytes Relative: 4 %
Neutro Abs: 6.4 10*3/uL (ref 1.7–7.7)
Neutrophils Relative %: 70 %
Platelets: 235 10*3/uL (ref 150–400)
RBC: 4.16 MIL/uL (ref 3.87–5.11)
RDW: 13.4 % (ref 11.5–15.5)
WBC: 9.2 10*3/uL (ref 4.0–10.5)
nRBC: 0 % (ref 0.0–0.2)

## 2021-10-30 LAB — GROUP A STREP BY PCR: Group A Strep by PCR: NOT DETECTED

## 2021-10-30 LAB — SARS CORONAVIRUS 2 BY RT PCR: SARS Coronavirus 2 by RT PCR: NEGATIVE

## 2021-10-30 MED ORDER — DEXAMETHASONE SODIUM PHOSPHATE 10 MG/ML IJ SOLN
10.0000 mg | Freq: Once | INTRAMUSCULAR | Status: AC
  Administered 2021-10-30: 10 mg via INTRAVENOUS
  Filled 2021-10-30: qty 1

## 2021-10-30 MED ORDER — SODIUM CHLORIDE 0.9 % IV BOLUS
1000.0000 mL | Freq: Once | INTRAVENOUS | Status: AC
  Administered 2021-10-30: 1000 mL via INTRAVENOUS

## 2021-10-30 MED ORDER — DIPHENHYDRAMINE HCL 50 MG/ML IJ SOLN
12.5000 mg | Freq: Once | INTRAMUSCULAR | Status: AC
  Administered 2021-10-30: 12.5 mg via INTRAVENOUS
  Filled 2021-10-30: qty 1

## 2021-10-30 MED ORDER — PROCHLORPERAZINE EDISYLATE 10 MG/2ML IJ SOLN
10.0000 mg | Freq: Once | INTRAMUSCULAR | Status: AC
  Administered 2021-10-30: 10 mg via INTRAVENOUS
  Filled 2021-10-30: qty 2

## 2021-10-30 MED ORDER — KETOROLAC TROMETHAMINE 15 MG/ML IJ SOLN
30.0000 mg | Freq: Once | INTRAMUSCULAR | Status: AC
  Administered 2021-10-30: 30 mg via INTRAVENOUS
  Filled 2021-10-30: qty 2

## 2021-10-30 NOTE — Discharge Instructions (Signed)
Follow up with primary care provider.  Return for new or worsening symptoms.

## 2021-10-30 NOTE — ED Provider Notes (Addendum)
[facility] DEPT Provider Note   CSN: [id number] Arrival date & time: 10/30/21  1627     History  Chief Complaint  Patient presents with   Migraine    Lauren Chapman is a 51 y.o. female here for evaluation of headache.  History of migraines.  This been going on for 4 days.  No trauma or injury.  No numbness or weakness.  Does have some light sensitivity.  She had multiple episodes of NBNB emesis.  Also admits to a sore throat however she thinks this is due to her multiple episodes of emesis.  No sick contacts.  No neck rigidity.  Does have some pain at the base of her head, superior aspect of her neck.  No decreased range of motion or pain with ROM to her neck.  No fever, chest pain, shortness of breath, difficulty word finding, facial droop, abd pain.  HPI     Home Medications Prior to Admission medications   Medication Sig Start Date End Date Taking? Authorizing Provider  albuterol (VENTOLIN HFA) 108 (90 Base) MCG/ACT inhaler Inhale 2 puffs into the lungs every 4 (four) hours as needed for wheezing or shortness of breath. Patient taking differently: Inhale 2 puffs into the lungs every hour as needed for wheezing or shortness of breath. 02/20/20   Olalere, Cicero Duck A, MD  ARIPiprazole (ABILIFY) 30 MG tablet Take 30 mg by mouth at bedtime. 12/23/20   [provider]  benztropine (COGENTIN) 0.5 MG tablet Take 0.5 mg by mouth 2 (two) times daily. 12/23/20   [provider]  budesonide-formoterol (SYMBICORT) 160-4.5 MCG/ACT inhaler Inhale 2 puffs into the lungs in the morning and at bedtime. 09/05/21   Cobb, Karie Schwalbe, NP  busPIRone (BUSPAR) 15 MG tablet Take 15 mg by mouth 3 (three) times daily. 07/30/20   [provider]  citalopram (CELEXA) 20 MG tablet Take 30 mg by mouth daily.    [provider]  EPINEPHrine 0.3 mg/0.3 mL IJ SOAJ injection Inject 0.3 mg into the muscle as needed for anaphylaxis. 03/29/21   Azucena Cecil, PA-C  esomeprazole (NEXIUM) 20 MG capsule Take 1 capsule (20 mg total) by mouth 2 (two) times daily before a meal. 03/21/19   Jacelyn Pi, Lilia Argue, MD  gabapentin (NEURONTIN) 800 MG tablet Take 800 mg by mouth 3 (three) times daily.  07/02/18   [provider]  lamoTRIgine (LAMICTAL) 150 MG tablet Take 150 mg by mouth 2 (two) times daily.     [provider]  MYRBETRIQ 25 MG TB24 tablet Take 25 mg by mouth daily. 07/08/21   [provider]  ondansetron (ZOFRAN) 4 MG tablet Take 1 tablet (4 mg total) by mouth every 8 (eight) hours as needed for nausea or vomiting. 01/05/20   Suzzanne Cloud, NP  perphenazine (TRILAFON) 4 MG tablet Take 4 mg by mouth at bedtime. 06/07/21   [provider]  SUMAtriptan (IMITREX) 6 MG/0.5ML SOLN injection Inject 6 mg into the skin See admin instructions. INJECT 6 MG (0.5 ml) INTO THE SKIN AS NEEDED FOR MIGRAINE OR HEADACHE. MAY REPEAT ONCE IN 2 HOURS IF HEADACHE PERSISTS OR RECURS. MAX 2 DOSES IN 24 HOURS. 06/11/21   [provider]  tiZANidine (ZANAFLEX) 4 MG tablet Take 1 tablet (4 mg total) by mouth every 6 (six) hours as needed for muscle spasms. 08/15/21   Pieter Partridge, DO      Allergies    Codeine    Review  of Systems   Review of Systems  HENT:  Positive for sore throat.   Eyes:  Positive for photophobia.  Respiratory: Negative.    Cardiovascular: Negative.   Gastrointestinal:  Positive for nausea and vomiting.  Genitourinary: Negative.   Musculoskeletal:  Negative for arthralgias, back pain, gait problem, joint swelling and neck stiffness.  Skin: Negative.   Neurological:  Positive for headaches. Negative for dizziness, tremors, seizures, syncope, facial asymmetry, speech difficulty, weakness, light-headedness and numbness.  All other systems reviewed and are negative.   Physical Exam Updated Vital Signs BP 108/60   Pulse 71   Temp 98.5 F (36.9 C) (Oral)   Resp 18   LMP 05/02/2019 Comment:  irregular cycles  SpO2 95%  Physical Exam Physical Exam  Constitutional: Pt is oriented to person, place, and time. Pt appears well-developed and well-nourished. No distress.  HENT:  Head: Normocephalic and atraumatic.  Mouth/Throat: Oropharynx is clear and moist.  Eyes: Conjunctivae and EOM are normal. Pupils are equal, round, and reactive to light. No scleral icterus.  No horizontal, vertical or rotational nystagmus  Neck: Normal range of motion. Neck supple.  Full active and passive ROM without pain No midline or paraspinal tenderness No nuchal rigidity or meningeal signs  Cardiovascular: Normal rate, regular rhythm and intact distal pulses.   Pulmonary/Chest: Effort normal and breath sounds normal. No respiratory distress. Pt has no wheezes. No rales.  Abdominal: Soft. Bowel sounds are normal. There is no tenderness. There is no rebound and no guarding.  Musculoskeletal: Normal range of motion.  Lymphadenopathy:    No cervical adenopathy.  Neurological: Pt. is alert and oriented to person, place, and time. He has normal reflexes. No cranial nerve deficit.  Exhibits normal muscle tone. Coordination normal.  Mental Status:  Alert, oriented, thought content appropriate. Speech fluent without evidence of aphasia. Able to follow 2 step commands without difficulty.  Cranial Nerves:  II:  Peripheral visual fields grossly normal, pupils equal, round, reactive to light III,IV, VI: ptosis not present, extra-ocular motions intact bilaterally  V,VII: smile symmetric, facial light touch sensation equal VIII: hearing grossly normal bilaterally  IX,X: midline uvula rise  XI: bilateral shoulder shrug equal and strong XII: midline tongue extension  Motor:  5/5 in upper and lower extremities bilaterally including strong and equal grip strength and dorsiflexion/plantar flexion Sensory: Pinprick and light touch normal in all extremities.  Deep Tendon Reflexes: 2+ and symmetric  Cerebellar: normal  finger-to-nose with bilateral upper extremities Gait: normal gait and balance CV: distal pulses palpable throughout   Skin: Skin is warm and dry. No rash noted. Pt is not diaphoretic.  Psychiatric: Pt has a normal mood and affect. Behavior is normal. Judgment and thought content normal.  Nursing note and vitals reviewed.  ED Results / Procedures / Treatments   Labs (all labs ordered are listed, but only abnormal results are displayed) Labs Reviewed  COMPREHENSIVE METABOLIC PANEL - Abnormal; Notable for the following components:      Result Value   Glucose, Bld 135 (*)    All other components within normal limits  GROUP A STREP BY PCR  SARS CORONAVIRUS 2 BY RT PCR  CBC WITH DIFFERENTIAL/PLATELET  LIPASE, BLOOD    EKG None  Radiology No results found.  Procedures Procedures    Medications Ordered in ED Medications  ketorolac (TORADOL) 15 MG/ML injection 30 mg (30 mg Intravenous Given 10/30/21 2023)  prochlorperazine (COMPAZINE) injection 10 mg (10 mg Intravenous Given 10/30/21 2022)  diphenhydrAMINE (BENADRYL) injection 12.5  mg (12.5 mg Intravenous Given 10/30/21 2020)  dexamethasone (DECADRON) injection 10 mg (10 mg Intravenous Given 10/30/21 2021)  sodium chloride 0.9 % bolus 1,000 mL (0 mLs Intravenous Stopped 10/30/21 2139)    ED Course/ Medical Decision Making/ A&P    51 year old here for evaluation of headache.  She has history of migraine takes Imitrex for this.  No recent head trauma.  She is nonfocal neuro exam without deficits.  No neck rigidity or stiffness.  She has negative Kernig's and Brudzinski sign.  Has had some photophobia as well as some NBNB emesis.  Admits to a sore throat however she thinks this is due to her multiple episodes of emesis.  No congestion, rhinorrhea, visual field cuts, difficulty with word finding, numbness or weakness.  No sudden onset" headache.  Labs personally viewed and interpreted:  CBC without leukocytosis Metabolic panel glucose  998 Lipase 36 Strep negative COVID neg  Patient reassessed.  Discussed her labs.  Will give migraine cocktail and reassess  Patient reassessed.  Migraine resolved.  Suspect her to a migraine.  At this time I have low suspicion for [facility], ICH, Meningitis, CVA, MS, dissection or temporal arteritis. Pt is afebrile with no focal neuro deficits, nuchal rigidity, or change in vision.   The patient has been appropriately medically screened and/or stabilized in the ED. I have low suspicion for any other emergent medical condition which would require further screening, evaluation or treatment in the ED or require inpatient management.  Patient is hemodynamically stable and in no acute distress.  Patient able to ambulate in department prior to ED.  Evaluation does not show acute pathology that would require ongoing or additional emergent interventions while in the emergency department or further inpatient treatment.  I have discussed the diagnosis with the patient and answered all questions.  Pain is been managed while in the emergency department and patient has no further complaints prior to discharge.  Patient is comfortable with plan discussed in room and is stable for discharge at this time.  I have discussed strict return precautions for returning to the emergency department.  Patient was encouraged to follow-up with PCP/specialist refer to at discharge.                            Medical Decision Making Amount and/or Complexity of Data Reviewed Independent Historian: parent External Data Reviewed: labs, radiology and notes. Labs: ordered. Decision-making details documented in ED Course.  Risk OTC drugs. Prescription drug management. Parenteral controlled substances. Decision regarding hospitalization. Diagnosis or treatment significantly limited by social determinants of health.    Final Clinical Impression(s) / ED Diagnoses Final diagnoses:  Other migraine without status migrainosus, not  intractable    Rx / DC Orders ED Discharge Orders     None           Rabia Argote A, PA-C 10/30/21 2214    Jeanell Sparrow, DO 10/30/21 2353

## 2021-10-30 NOTE — ED Provider Triage Note (Signed)
Emergency Medicine Provider Triage Evaluation Note  Lauren IGLESIA , a 51 y.o. female  was evaluated in triage.  Pt complains of migraine x 4 days. Hx of similar. Some mild generalized neck pain, however full ROM. No fever. Multiple NBNB emesis. Associated sore throat. No cough, congestion, CP,OSB, abd pain. Take Imitrex or HA without relief. No sudden onset HA, numbness, weakness  Review of Systems  Positive: HA, emesis, ST Negative: Fever, cough  Physical Exam  LMP 05/02/2019 Comment: irregular cycles Gen:   Awake, no distress   Resp:  Normal effort  Neck:  Chin to chest without difficulty, Full ROM MSK:   Moves extremities without difficulty  Neuro:  CN 2-12 grossly intact, ambulatory, intact sensation Other:    Medical Decision Making  Medically screening exam initiated at 4:42 PM.  Appropriate orders placed.  JASLEEN RIEPE was informed that the remainder of the evaluation will be completed by another provider, this initial triage assessment does not replace that evaluation, and the importance of remaining in the ED until their evaluation is complete.  HA   Chamari Cutbirth A, PA-C 10/30/21 1645

## 2021-10-30 NOTE — ED Triage Notes (Signed)
Pt arrived via POV, c/o migraine x3-4 days. Takes imitrex injections normally for migraines, no relief this episode.

## 2021-11-02 ENCOUNTER — Ambulatory Visit (INDEPENDENT_AMBULATORY_CARE_PROVIDER_SITE_OTHER): Payer: Medicare Other

## 2021-11-02 VITALS — Ht 63.0 in | Wt 200.0 lb

## 2021-11-02 DIAGNOSIS — Z0000 Encounter for general adult medical examination without abnormal findings: Secondary | ICD-10-CM

## 2021-11-02 NOTE — Progress Notes (Signed)
Subjective:   Lauren Chapman is a 51 y.o. female who presents for Medicare Annual (Subsequent) preventive examination.   Virtual Visit via Telephone Note  I connected with  Lauren Chapman on 11/02/21 at  8:45 AM EDT by telephone and verified that I am speaking with the correct person using two identifiers.  Location: Patient: home  Provider: [place]  Persons participating in the virtual visit: patient/Nurse Health Advisor   I discussed the limitations, risks, security and privacy concerns of performing an evaluation and management service by telephone and the availability of in person appointments. The patient expressed understanding and agreed to proceed.  Interactive audio and video telecommunications were attempted between this nurse and patient, however failed, due to patient having technical difficulties OR patient did not have access to video capability.  We continued and completed visit with audio only.  Some vital signs may be absent or patient reported.   Daphane Shepherd, LPN  Review of Systems     Cardiac Risk Factors include: advanced age (>33mn, >>21women)     Objective:    Today's Vitals   11/02/21 0850  Weight: 200 lb (90.7 kg)  Height: '5\' 3"'$  (1.6 m)   Body mass index is 35.43 kg/m.     11/02/2021    8:54 AM 08/15/2021    3:17 PM 07/31/2021    1:57 PM 07/18/2021   11:44 AM 06/07/2021    4:59 PM 03/29/2021    1:00 PM 03/16/2021    9:12 AM  Advanced Directives  Does Patient Have a Medical Advance Directive? No No No No No No No  Would patient like information on creating a medical advance directive? No - Patient declined  No - Patient declined Yes (ED - Information included in AVS)       Current Medications (verified) Outpatient Encounter Medications as of 11/02/2021  Medication Sig   albuterol (VENTOLIN HFA) 108 (90 Base) MCG/ACT inhaler Inhale 2 puffs into the lungs every 4 (four) hours as needed for wheezing or shortness of breath. (Patient taking  differently: Inhale 2 puffs into the lungs every hour as needed for wheezing or shortness of breath.)   ARIPiprazole (ABILIFY) 30 MG tablet Take 30 mg by mouth at bedtime.   benztropine (COGENTIN) 0.5 MG tablet Take 0.5 mg by mouth 2 (two) times daily.   budesonide-formoterol (SYMBICORT) 160-4.5 MCG/ACT inhaler Inhale 2 puffs into the lungs in the morning and at bedtime.   busPIRone (BUSPAR) 15 MG tablet Take 15 mg by mouth 3 (three) times daily.   citalopram (CELEXA) 20 MG tablet Take 30 mg by mouth daily.   EPINEPHrine 0.3 mg/0.3 mL IJ SOAJ injection Inject 0.3 mg into the muscle as needed for anaphylaxis.   esomeprazole (NEXIUM) 20 MG capsule Take 1 capsule (20 mg total) by mouth 2 (two) times daily before a meal.   gabapentin (NEURONTIN) 800 MG tablet Take 800 mg by mouth 3 (three) times daily.    lamoTRIgine (LAMICTAL) 150 MG tablet Take 150 mg by mouth 2 (two) times daily.    MYRBETRIQ 25 MG TB24 tablet Take 25 mg by mouth daily.   ondansetron (ZOFRAN) 4 MG tablet Take 1 tablet (4 mg total) by mouth every 8 (eight) hours as needed for nausea or vomiting.   perphenazine (TRILAFON) 4 MG tablet Take 4 mg by mouth at bedtime.   SUMAtriptan (IMITREX) 6 MG/0.5ML SOLN injection Inject 6 mg into the skin See admin instructions. INJECT 6 MG (0.5 ml) INTO THE  SKIN AS NEEDED FOR MIGRAINE OR HEADACHE. MAY REPEAT ONCE IN 2 HOURS IF HEADACHE PERSISTS OR RECURS. MAX 2 DOSES IN 24 HOURS.   tiZANidine (ZANAFLEX) 4 MG tablet Take 1 tablet (4 mg total) by mouth every 6 (six) hours as needed for muscle spasms.   No facility-administered encounter medications on file as of 11/02/2021.    Allergies (verified) Codeine   History: Past Medical History:  Diagnosis Date   Allergy    Anxiety    Bipolar affective psychosis ([place])    Complication of anesthesia    " Acting silly- waving at everyone"-Giddy   Depression    Emphysema lung ([facility])    inhaler prn,  followed by pcp and pulmonologist-- dr Margorie John    Family history of adverse reaction to anesthesia    Father had rheumatic fever, and died on operating table.   GERD (gastroesophageal reflux disease)    Kidney cysts 01/24/2020   Right Kidney   Migraine    Moderate persistent asthma 09/05/2021   Smokers' cough ([place])    per pt not productive   SUI (stress urinary incontinence, female)    Tubular adenoma of colon 07/08/2020   Wears glasses    Past Surgical History:  Procedure Laterality Date   ABDOMINAL HYSTERECTOMY N/A    Phreesia 11/30/2019   CARPAL TUNNEL RELEASE Right 09-12-2007   '@[facility]'$    and GANGLION CYST EXCISION   COLONOSCOPY     CYSTOSCOPY N/A 04/29/2019   Procedure: CYSTOSCOPY;  Surgeon: Joseph Pierini, MD;  [facility];  Service: Gynecology;  Laterality: N/A;   DILATATION & CURETTAGE/HYSTEROSCOPY WITH MYOSURE N/A 11/13/2018   Procedure: DILATATION & CURETTAGE/HYSTEROSCOPY WITH MYOSURE;  Surgeon: Anastasio Auerbach, MD;  Location: [facility];  Service: Gynecology;  Laterality: N/A;  request 9:00am OR start time in [place] block requests one hour   [place]  02/2019   EYE SURGERY N/A    Phreesia 11/30/2019   GANGLION CYST EXCISION     R wrist   interstem      implant for overactive bladder   KNEE ARTHROSCOPY WITH ANTERIOR CRUCIATE LIGAMENT (ACL) REPAIR Right 10/2015   LAPAROSCOPIC CHOLECYSTECTOMY  1997   LAPAROSCOPIC HYSTERECTOMY Bilateral 04/29/2019   Procedure: HYSTERECTOMY TOTAL LAPAROSCOPIC, BILATERAL SALPINO-OOPHORECTOMY;  Surgeon: Joseph Pierini, MD;  Location: [facility];  Service: Gynecology;  Laterality: Bilateral;   TOOTH EXTRACTION  04/01/2019   tooth removal Bilateral 2022   4 teeth removed   UPPER GASTROINTESTINAL ENDOSCOPY     Family History  Problem Relation Age of Onset   Depression Mother    Cancer Mother        Cervical   Alcohol abuse Father    Crohn's disease Sister    Stroke Brother 1   Migraines  Daughter    GER disease Daughter    Depression Daughter    Migraines Daughter    GER disease Daughter    Colon cancer Neg Hx    Rectal cancer Neg Hx    Stomach cancer Neg Hx    Social History   Socioeconomic History   Marital status: Divorced    Spouse name: n/a   Number of children: 2   Years of education: 18   Highest education level: Not on file  Occupational History   Occupation: Disabled    Comment: Formerly a Pharmacist, hospital.  Tobacco Use   Smoking status: Former    Packs/day: 1.00    Years: 39.00  Total pack years: 39.00    Types: Cigarettes    Start date: 10/10/1981    Quit date: 07/18/2021    Years since quitting: 0.2   Smokeless tobacco: Never  Vaping Use   Vaping Use: Never used  Substance and Sexual Activity   Alcohol use: Never    Alcohol/week: 0.0 standard drinks of alcohol   Drug use: Never   Sexual activity: Not Currently    Partners: Male    Birth control/protection: Post-menopausal    Comment: 1st intercourse 51 yo-More than 5 partners  Other Topics Concern   Not on file  Social History Narrative   Lives at home with 1 of her two daughters   Right-handed.   2-4 cups caffeine daily.   Disabled    One story home   Social Determinants of Health   Financial Resource Strain: Low Risk  (11/02/2021)   Overall Financial Resource Strain (CARDIA)    Difficulty of Paying Living Expenses: Not hard at all  Food Insecurity: No Food Insecurity (11/02/2021)   Hunger Vital Sign    Worried About Running Out of Food in the Last Year: Never true    Ran Out of Food in the Last Year: Never true  Transportation Needs: No Transportation Needs (11/02/2021)   PRAPARE - Hydrologist (Medical): No    Lack of Transportation (Non-Medical): No  Physical Activity: Insufficiently Active (11/02/2021)   Exercise Vital Sign    Days of Exercise per Week: 3 days    Minutes of Exercise per Session: 30 min  Stress: No Stress Concern Present (11/02/2021)    [place]    Feeling of Stress : Not at all  Social Connections: Moderately Isolated (11/02/2021)   Social Connection and Isolation Panel [NHANES]    Frequency of Communication with Friends and Family: More than three times a week    Frequency of Social Gatherings with Friends and Family: More than three times a week    Attends Religious Services: More than 4 times per year    Active Member of [employer] or Organizations: No    Attends Music therapist: Never    Marital Status: Divorced    Tobacco Counseling Counseling given: Not Answered   Clinical Intake:  Pre-visit preparation completed: Yes  Pain : No/denies pain     Nutritional Risks: None Diabetes: No  How often do you need to have someone help you when you read instructions, pamphlets, or other written materials from your doctor or pharmacy?: 1 - Never  Diabetic?no   Interpreter Needed?: No  Information entered by :: Jadene Pierini, LPN   Activities of Daily Living    11/02/2021    8:54 AM 07/18/2021    9:15 PM  In your present state of health, do you have any difficulty performing the following activities:  Hearing? 0 0  Vision? 0 0  Difficulty concentrating or making decisions? 0 0  Walking or climbing stairs? 0 1  Dressing or bathing? 0 0  Doing errands, shopping? 0 0  Preparing Food and eating ? N   Using the Toilet? N   In the past six months, have you accidently leaked urine? N   Do you have problems with loss of bowel control? N   Managing your Medications? N   Managing your Finances? N   Housekeeping or managing your Housekeeping? N     Patient Care Team: Wendie Agreste, MD as PCP -  General (Family Medicine) Jamse Arn, MD as Consulting Physician (Physical Medicine and Rehabilitation)  Indicate any recent Medical Services you may have received from other than [facility] providers in the past year (date may be  approximate).     Assessment:   This is a routine wellness examination for Lauren Chapman.  Hearing/Vision screen Vision Screening - Comments:: Annual eye exams   Dietary issues and exercise activities discussed: Current Exercise Habits: Home exercise routine, Type of exercise: walking, Time (Minutes): 30, Frequency (Times/Week): 3, Weekly Exercise (Minutes/Week): 90, Intensity: Mild, Exercise limited by: None identified   Goals Addressed             This Visit's Progress    Patient Stated   On track    Quit smoking        Depression Screen    11/02/2021    8:53 AM 11/02/2021    8:52 AM 08/05/2021   11:38 AM 03/14/2021    3:01 PM 12/10/2020   10:33 AM 08/02/2020   10:10 AM 07/05/2020    9:59 AM  PHQ 2/9 Scores  PHQ - 2 Score 0 0 '2 1 2 3 3  '$ PHQ- 9 Score   '8 4 15 5 6    '$ Fall Risk    11/02/2021    8:51 AM 08/15/2021    3:17 PM 08/05/2021   11:38 AM 05/27/2021   10:26 AM 03/16/2021    9:10 AM  Fall Risk   Falls in the past year? 0 1 0 0 1  Number falls in past yr: 0 0 0 0 1  Injury with Fall? 0 0 0 0 0  Risk for fall due to : No Fall Risks  No Fall Risks History of fall(s)   Follow up Falls prevention discussed  Falls evaluation completed Falls evaluation completed     [place]:  Any stairs in or around the home? No  If so, are there any without handrails? No  Home free of loose throw rugs in walkways, pet beds, electrical cords, etc? Yes  Adequate lighting in your home to reduce risk of falls? Yes   ASSISTIVE DEVICES UTILIZED TO PREVENT FALLS:  Life alert? No  Use of a cane, walker or w/c? No  Grab bars in the bathroom? No  Shower chair or bench in shower? No  Elevated toilet seat or a handicapped toilet? No        11/02/2021    8:54 AM 03/17/2019    8:36 AM  6CIT Screen  What Year? 0 points 0 points  What month? 0 points 0 points  What time? 0 points 0 points  Count back from 20 0 points 0 points  Months in reverse 0  points 0 points  Repeat phrase 0 points 0 points  Total Score 0 points 0 points    Immunizations Immunization History  Administered Date(s) Administered   Influenza Inj Mdck Quad Pf 01/03/2019   Influenza Split 10/11/2011   Influenza,inj,Quad PF,6+ Mos 12/04/2017, 12/01/2019   Influenza-Unspecified 01/03/2019   PFIZER(Purple Top)SARS-COV-2 Vaccination 04/19/2019, 05/10/2019, 01/22/2020, 10/26/2020   PNEUMOCOCCAL CONJUGATE-20 08/02/2020   Tdap 10/16/2018   Zoster Recombinat (Shingrix) 05/31/2020, 08/02/2020    TDAP status: Up to date  Flu Vaccine status: Due, Education has been provided regarding the importance of this vaccine. Advised may receive this vaccine at local pharmacy or Health Dept. Aware to provide a copy of the vaccination record if obtained from local pharmacy or Health Dept. Verbalized acceptance and  understanding.  Pneumococcal vaccine status: Up to date  Covid-19 vaccine status: Completed vaccines  Qualifies for Shingles Vaccine? Yes   Zostavax completed Yes   Shingrix Completed?: Yes  Screening Tests Health Maintenance  Topic Date Due   PAP SMEAR-Modifier  05/15/2020   COVID-19 Vaccine (5 - Pfizer risk series) 12/21/2020   INFLUENZA VACCINE  08/23/2021   MAMMOGRAM  02/21/2022   Fecal DNA (Cologuard)  05/02/2022   TETANUS/TDAP  10/15/2028   Hepatitis C Screening  Completed   HIV Screening  Completed   Zoster Vaccines- Shingrix  Completed   HPV VACCINES  Aged Out   COLONOSCOPY (Pts 45-44yr Insurance coverage will need to be confirmed)  Discontinued    Health Maintenance  Health Maintenance Due  Topic Date Due   PAP SMEAR-Modifier  05/15/2020   COVID-19 Vaccine (5 - Pfizer risk series) 12/21/2020   INFLUENZA VACCINE  08/23/2021    Colorectal cancer screening: Type of screening: Cologuard. Completed 05/02/2019. Repeat every 3 years  Mammogram status: Completed 01/302023. Repeat every year  Bone Density status: Ordered Not of age . Pt provided  with contact info and advised to call to schedule appt.  Lung Cancer Screening: (Low Dose CT Chest recommended if Age 53-80years, 30 pack-year currently smoking OR have quit w/in 15years.) does qualify.   Lung Cancer Screening Referral: declined at this time   Additional Screening:  Hepatitis C Screening: does not qualify;  Vision Screening: Recommended annual ophthalmology exams for early detection of glaucoma and other disorders of the eye. Is the patient up to date with their annual eye exam?  Yes  Who is the provider or what is the name of the office in which the patient attends annual eye exams? Dr.Groat  If pt is not established with a provider, would they like to be referred to a provider to establish care? No .   Dental Screening: Recommended annual dental exams for proper oral hygiene  Community Resource Referral / Chronic Care Management: CRR required this visit?  No   CCM required this visit?  No      Plan:     I have personally reviewed and noted the following in the patient's chart:   Medical and social history Use of alcohol, tobacco or illicit drugs  Current medications and supplements including opioid prescriptions. Patient is not currently taking opioid prescriptions. Functional ability and status Nutritional status Physical activity Advanced directives List of other physicians Hospitalizations, surgeries, and ER visits in previous 12 months Vitals Screenings to include cognitive, depression, and falls Referrals and appointments  In addition, I have reviewed and discussed with patient certain preventive protocols, quality metrics, and best practice recommendations. A written personalized care plan for preventive services as well as general preventive health recommendations were provided to patient.     LDaphane Shepherd LPN   [id number]  Nurse Notes: none

## 2021-11-02 NOTE — Patient Instructions (Signed)
Lauren Chapman , Thank you for taking time to come for your Medicare Wellness Visit. I appreciate your ongoing commitment to your health goals. Please review the following plan we discussed and let me know if I can assist you in the future.   These are the goals we discussed:  Goals      Patient Stated     Quit smoking         This is a list of the screening recommended for you and due dates:  Health Maintenance  Topic Date Due   Pap Smear  05/15/2020   COVID-19 Vaccine (5 - Pfizer risk series) 12/21/2020   Flu Shot  08/23/2021   Mammogram  02/21/2022   Cologuard (Stool DNA test)  05/02/2022   Tetanus Vaccine  10/15/2028   Hepatitis C Screening: USPSTF Recommendation to screen - Ages 18-79 yo.  Completed   HIV Screening  Completed   Zoster (Shingles) Vaccine  Completed   HPV Vaccine  Aged Out   Colon Cancer Screening  Discontinued    Advanced directives: Advance directive discussed with you today. I have provided a copy for you to complete at home and have notarized. Once this is complete please bring a copy in to our office so we can scan it into your chart.   Conditions/risks identified: Aim for 30 minutes of exercise or brisk walking, 6-8 glasses of water, and 5 servings of fruits and vegetables each day.   Next appointment: Follow up in one year for your annual wellness visit.   Preventive Care 40-64 Years, Female Preventive care refers to lifestyle choices and visits with your health care provider that can promote health and wellness. What does preventive care include? A yearly physical exam. This is also called an annual well check. Dental exams once or twice a year. Routine eye exams. Ask your health care provider how often you should have your eyes checked. Personal lifestyle choices, including: Daily care of your teeth and gums. Regular physical activity. Eating a healthy diet. Avoiding tobacco and drug use. Limiting alcohol use. Practicing safe sex. Taking  low-dose aspirin daily starting at age 20. Taking vitamin and mineral supplements as recommended by your health care provider. What happens during an annual well check? The services and screenings done by your health care provider during your annual well check will depend on your age, overall health, lifestyle risk factors, and family history of disease. Counseling  Your health care provider may ask you questions about your: Alcohol use. Tobacco use. Drug use. Emotional well-being. Home and relationship well-being. Sexual activity. Eating habits. Work and work Statistician. Method of birth control. Menstrual cycle. Pregnancy history. Screening  You may have the following tests or measurements: Height, weight, and BMI. Blood pressure. Lipid and cholesterol levels. These may be checked every 5 years, or more frequently if you are over 25 years old. Skin check. Lung cancer screening. You may have this screening every year starting at age 41 if you have a 30-pack-year history of smoking and currently smoke or have quit within the past 15 years. Fecal occult blood test (FOBT) of the stool. You may have this test every year starting at age 40. Flexible sigmoidoscopy or colonoscopy. You may have a sigmoidoscopy every 5 years or a colonoscopy every 10 years starting at age 21. Hepatitis C blood test. Hepatitis B blood test. Sexually transmitted disease (STD) testing. Diabetes screening. This is done by checking your blood sugar (glucose) after you have not eaten for a while (  fasting). You may have this done every 1-3 years. Mammogram. This may be done every 1-2 years. Talk to your health care provider about when you should start having regular mammograms. This may depend on whether you have a family history of breast cancer. BRCA-related cancer screening. This may be done if you have a family history of breast, ovarian, tubal, or peritoneal cancers. Pelvic exam and Pap test. This may be done  every 3 years starting at age 96. Starting at age 8, this may be done every 5 years if you have a Pap test in combination with an HPV test. Bone density scan. This is done to screen for osteoporosis. You may have this scan if you are at high risk for osteoporosis. Discuss your test results, treatment options, and if necessary, the need for more tests with your health care provider. Vaccines  Your health care provider may recommend certain vaccines, such as: Influenza vaccine. This is recommended every year. Tetanus, diphtheria, and acellular pertussis (Tdap, Td) vaccine. You may need a Td booster every 10 years. Zoster vaccine. You may need this after age 84. Pneumococcal 13-valent conjugate (PCV13) vaccine. You may need this if you have certain conditions and were not previously vaccinated. Pneumococcal polysaccharide (PPSV23) vaccine. You may need one or two doses if you smoke cigarettes or if you have certain conditions. Talk to your health care provider about which screenings and vaccines you need and how often you need them. This information is not intended to replace advice given to you by your health care provider. Make sure you discuss any questions you have with your health care provider. Document Released: 02/05/2015 Document Revised: 09/29/2015 Document Reviewed: 11/10/2014 Elsevier Interactive Patient Education  2017 [place] Prevention in the Home Falls can cause injuries. They can happen to people of all ages. There are many things you can do to make your home safe and to help prevent falls. What can I do on the outside of my home? Regularly fix the edges of walkways and driveways and fix any cracks. Remove anything that might make you trip as you walk through a door, such as a raised step or threshold. Trim any bushes or trees on the path to your home. Use bright outdoor lighting. Clear any walking paths of anything that might make someone trip, such as rocks or  tools. Regularly check to see if handrails are loose or broken. Make sure that both sides of any steps have handrails. Any raised decks and porches should have guardrails on the edges. Have any leaves, snow, or ice cleared regularly. Use sand or salt on walking paths during winter. Clean up any spills in your garage right away. This includes oil or grease spills. What can I do in the bathroom? Use night lights. Install grab bars by the toilet and in the tub and shower. Do not use towel bars as grab bars. Use non-skid mats or decals in the tub or shower. If you need to sit down in the shower, use a plastic, non-slip stool. Keep the floor dry. Clean up any water that spills on the floor as soon as it happens. Remove soap buildup in the tub or shower regularly. Attach bath mats securely with double-sided non-slip rug tape. Do not have throw rugs and other things on the floor that can make you trip. What can I do in the bedroom? Use night lights. Make sure that you have a light by your bed that is easy to  reach. Do not use any sheets or blankets that are too big for your bed. They should not hang down onto the floor. Have a firm chair that has side arms. You can use this for support while you get dressed. Do not have throw rugs and other things on the floor that can make you trip. What can I do in the kitchen? Clean up any spills right away. Avoid walking on wet floors. Keep items that you use a lot in easy-to-reach places. If you need to reach something above you, use a strong step stool that has a grab bar. Keep electrical cords out of the way. Do not use floor polish or wax that makes floors slippery. If you must use wax, use non-skid floor wax. Do not have throw rugs and other things on the floor that can make you trip. What can I do with my stairs? Do not leave any items on the stairs. Make sure that there are handrails on both sides of the stairs and use them. Fix handrails that are  broken or loose. Make sure that handrails are as long as the stairways. Check any carpeting to make sure that it is firmly attached to the stairs. Fix any carpet that is loose or worn. Avoid having throw rugs at the top or bottom of the stairs. If you do have throw rugs, attach them to the floor with carpet tape. Make sure that you have a light switch at the top of the stairs and the bottom of the stairs. If you do not have them, ask someone to add them for you. What else can I do to help prevent falls? Wear shoes that: Do not have high heels. Have rubber bottoms. Are comfortable and fit you well. Are closed at the toe. Do not wear sandals. If you use a stepladder: Make sure that it is fully opened. Do not climb a closed stepladder. Make sure that both sides of the stepladder are locked into place. Ask someone to hold it for you, if possible. Clearly mark and make sure that you can see: Any grab bars or handrails. First and last steps. Where the edge of each step is. Use tools that help you move around (mobility aids) if they are needed. These include: Canes. Walkers. Scooters. Crutches. Turn on the lights when you go into a dark area. Replace any light bulbs as soon as they burn out. Set up your furniture so you have a clear path. Avoid moving your furniture around. If any of your floors are uneven, fix them. If there are any pets around you, be aware of where they are. Review your medicines with your doctor. Some medicines can make you feel dizzy. This can increase your chance of falling. Ask your doctor what other things that you can do to help prevent falls. This information is not intended to replace advice given to you by your health care provider. Make sure you discuss any questions you have with your health care provider. Document Released: 11/05/2008 Document Revised: 06/17/2015 Document Reviewed: 02/13/2014 Elsevier Interactive Patient Education  2017 [employer].

## 2021-11-07 ENCOUNTER — Encounter: Payer: Self-pay | Admitting: Family Medicine

## 2021-11-07 ENCOUNTER — Ambulatory Visit (INDEPENDENT_AMBULATORY_CARE_PROVIDER_SITE_OTHER): Payer: Medicare Other | Admitting: Family Medicine

## 2021-11-07 VITALS — BP 118/80 | HR 58 | Temp 98.0°F | Ht 63.0 in | Wt 210.8 lb

## 2021-11-07 DIAGNOSIS — E669 Obesity, unspecified: Secondary | ICD-10-CM | POA: Diagnosis not present

## 2021-11-07 DIAGNOSIS — Z6837 Body mass index (BMI) 37.0-37.9, adult: Secondary | ICD-10-CM

## 2021-11-07 DIAGNOSIS — E66812 Obesity, class 2: Secondary | ICD-10-CM

## 2021-11-07 DIAGNOSIS — Z87898 Personal history of other specified conditions: Secondary | ICD-10-CM | POA: Diagnosis not present

## 2021-11-07 DIAGNOSIS — E781 Pure hyperglyceridemia: Secondary | ICD-10-CM

## 2021-11-07 DIAGNOSIS — J449 Chronic obstructive pulmonary disease, unspecified: Secondary | ICD-10-CM | POA: Diagnosis not present

## 2021-11-07 DIAGNOSIS — Z23 Encounter for immunization: Secondary | ICD-10-CM

## 2021-11-07 LAB — COMPREHENSIVE METABOLIC PANEL
ALT: 32 U/L (ref 0–35)
AST: 26 U/L (ref 0–37)
Albumin: 4.1 g/dL (ref 3.5–5.2)
Alkaline Phosphatase: 83 U/L (ref 39–117)
BUN: 11 mg/dL (ref 6–23)
CO2: 22 mEq/L (ref 19–32)
Calcium: 9.3 mg/dL (ref 8.4–10.5)
Chloride: 103 mEq/L (ref 96–112)
Creatinine, Ser: 1 mg/dL (ref 0.40–1.20)
GFR: 64.31 mL/min (ref >60.00)
Glucose, Bld: 72 mg/dL (ref 70–99)
Potassium: 4.8 mEq/L (ref 3.5–5.1)
Sodium: 138 mEq/L (ref 135–145)
Total Bilirubin: 0.4 mg/dL (ref 0.2–1.2)
Total Protein: 6.5 g/dL (ref 6.0–8.3)

## 2021-11-07 LAB — LIPID PANEL
Cholesterol: 153 mg/dL (ref 0–200)
HDL: 52.5 mg/dL (ref >39.00)
LDL Cholesterol: 73 mg/dL (ref 0–99)
NonHDL: 100.1
Total CHOL/HDL Ratio: 3
Triglycerides: 134 mg/dL (ref 0.0–149.0)
VLDL: 26.8 mg/dL (ref 0.0–40.0)

## 2021-11-07 LAB — HEMOGLOBIN A1C: Hgb A1c MFr Bld: 6.1 % (ref 4.6–6.5)

## 2021-11-07 NOTE — Progress Notes (Unsigned)
Subjective:  Patient ID: Lauren Chapman, female    DOB: April 05, 1970  Age: 51 y.o. MRN: [id number]  CC:  Chief Complaint  Patient presents with   COPD    Pt wants to know if you could test her for diabetes Pt wants pneumonia shot   Depression    PHQ9 - 9    HPI Lauren Chapman presents for   COPD: Last visit July 14, hospital follow-up that time after COPD exacerbation, pneumonia and hide acute hypoxic respiratory failure.  Was improving at her July 14 visit.  Treated by pulmonary with Symbicort, albuterol as needed.  On CPAP for OSA.  Had quit smoking and had not needed oxygen at that visit.  Had recently restarted her CPAP at that time. Using cpap consistently.  Only needed albuterol as needed - once. Symbicort working well - BID dosing.   No cigarettes since hospital stay in June. Some weight gain with food cravings. Has reached out to 1-800-quit now a few times.  Has also discussed with her therapist.  Prediabetes: Borderline prediabetes on A1c in May 2022.  Improved on repeat testing in May of this year. Slight elevated trig in past - no meds.  Has gained weight with food choices. Not eating with dieting.  Would like to meet with nutritionist.  Some increased thirst, no blurry vision.  Wt Readings from Last 3 Encounters:  11/07/21 210 lb 12.8 oz (95.6 kg)  11/02/21 200 lb (90.7 kg)  09/23/21 203 lb (92.1 kg)  Weight 190  on 08/05/21.  Body mass index is 37.34 kg/m. Fasting today.  Lab Results  Component Value Date   HGBA1C 5.5 05/27/2021   Lab Results  Component Value Date   CHOL 180 05/27/2021   HDL 44.40 05/27/2021   LDLCALC 99 05/27/2021   TRIG 184.0 (H) 05/27/2021   CHOLHDL 4 05/27/2021   Wt Readings from Last 3 Encounters:  11/07/21 210 lb 12.8 oz (95.6 kg)  11/02/21 200 lb (90.7 kg)  09/23/21 203 lb (92.1 kg)   She is followed by neurology, , Dr. Tomi Likens with history of migraine.  History of bipolar disorder, followed by psychiatry and counselor.  No  suicidal ideation.    11/07/2021   11:00 AM 11/02/2021    8:53 AM 11/02/2021    8:52 AM 08/05/2021   11:38 AM 03/14/2021    3:01 PM  Depression screen PHQ 2/9  Decreased Interest 2 0 0 1 0  Down, Depressed, Hopeless 1 0 0 1 1  PHQ - 2 Score 3 0 0 2 1  Altered sleeping 1   0 1  Tired, decreased energy 0   3 1  Change in appetite 3   1 0  Feeling bad or failure about yourself  2   0 1  Trouble concentrating 0   1 0  Moving slowly or fidgety/restless 0   0 0  Suicidal thoughts    1 0  PHQ-9 Score '9   8 4       '$ History Patient Active Problem List   Diagnosis Date Noted   Moderate persistent asthma 09/05/2021   Syncope 08/02/2021   Tobacco abuse 08/02/2021   Vaginal candidiasis 08/02/2021   CAP (community acquired pneumonia) 07/18/2021   OSA (obstructive sleep apnea) 07/18/2021   Hypokalemia 07/18/2021   Depression 03/24/2020   Bilateral low back pain with left-sided sciatica 03/24/2020   Left lumbar radiculopathy 02/25/2020   Absence of bladder continence 02/25/2020   History of postmenopausal bleeding  04/29/2019   Polypharmacy 04/03/2019   Emphysema, unspecified ([place]) 05/17/2018   Weakness 01/25/2017   Chronic migraine w/o aura w/o status migrainosus, not intractable 12/22/2014   Encounter for other general counseling or advice on contraception 09/02/2014   Bipolar disorder ([place]) 10/12/2011   Anxiety and depression 10/12/2011   GERD (gastroesophageal reflux disease) 10/12/2011   Past Medical History:  Diagnosis Date   Allergy    Anxiety    Bipolar affective psychosis ([place])    Complication of anesthesia    " Acting silly- waving at everyone"-Giddy   Depression    Emphysema lung ([place])    inhaler prn,  followed by pcp and pulmonologist-- dr Margorie John   Family history of adverse reaction to anesthesia    Father had rheumatic fever, and died on operating table.   GERD (gastroesophageal reflux disease)    Kidney cysts 01/24/2020   Right Kidney   Migraine     Moderate persistent asthma 09/05/2021   Smokers' cough ([place])    per pt not productive   SUI (stress urinary incontinence, female)    Tubular adenoma of colon 07/08/2020   Wears glasses    Past Surgical History:  Procedure Laterality Date   ABDOMINAL HYSTERECTOMY N/A    Phreesia 11/30/2019   CARPAL TUNNEL RELEASE Right 09-12-2007   '@[facility]'$    and GANGLION CYST EXCISION   COLONOSCOPY     CYSTOSCOPY N/A 04/29/2019   Procedure: CYSTOSCOPY;  Surgeon: Joseph Pierini, MD;  Location: [facility];  Service: Gynecology;  Laterality: N/A;   DILATATION & CURETTAGE/HYSTEROSCOPY WITH MYOSURE N/A 11/13/2018   Procedure: DILATATION & CURETTAGE/HYSTEROSCOPY WITH MYOSURE;  Surgeon: Anastasio Auerbach, MD;  Location: [facility];  Service: Gynecology;  Laterality: N/A;  request 9:00am OR start time in [place] block requests one hour   [place]  02/2019   EYE SURGERY N/A    Phreesia 11/30/2019   GANGLION CYST EXCISION     R wrist   interstem      implant for overactive bladder   KNEE ARTHROSCOPY WITH ANTERIOR CRUCIATE LIGAMENT (ACL) REPAIR Right 10/2015   LAPAROSCOPIC CHOLECYSTECTOMY  1997   LAPAROSCOPIC HYSTERECTOMY Bilateral 04/29/2019   Procedure: HYSTERECTOMY TOTAL LAPAROSCOPIC, BILATERAL SALPINO-OOPHORECTOMY;  Surgeon: Joseph Pierini, MD;  Location: [facility];  Service: Gynecology;  Laterality: Bilateral;   TOOTH EXTRACTION  04/01/2019   tooth removal Bilateral 2022   4 teeth removed   UPPER GASTROINTESTINAL ENDOSCOPY     Allergies  Allergen Reactions   Codeine Anaphylaxis and Other (See Comments)    Cannot have ANYTHING with codeine   Prior to Admission medications   Medication Sig Start Date End Date Taking? Authorizing Provider  albuterol (VENTOLIN HFA) 108 (90 Base) MCG/ACT inhaler Inhale 2 puffs into the lungs every 4 (four) hours as needed for wheezing or shortness of breath. Patient taking differently:  Inhale 2 puffs into the lungs every hour as needed for wheezing or shortness of breath. 02/20/20  Yes Olalere, Adewale A, MD  ARIPiprazole (ABILIFY) 30 MG tablet Take 30 mg by mouth at bedtime. 12/23/20  Yes [provider]  budesonide-formoterol (SYMBICORT) 160-4.5 MCG/ACT inhaler Inhale 2 puffs into the lungs in the morning and at bedtime. 09/05/21  Yes Cobb, Karie Schwalbe, NP  busPIRone (BUSPAR) 15 MG tablet Take 15 mg by mouth 3 (three) times daily. 07/30/20  Yes [provider]  citalopram (CELEXA) 20 MG tablet Take 30 mg by mouth daily.   Yes [provider]  EPINEPHrine 0.3 mg/0.3 mL IJ SOAJ injection Inject 0.3 mg into the muscle as needed for anaphylaxis. 03/29/21  Yes Azucena Cecil, PA-C  esomeprazole (NEXIUM) 20 MG capsule Take 1 capsule (20 mg total) by mouth 2 (two) times daily before a meal. 03/21/19  Yes Jacelyn Pi, Lilia Argue, MD  gabapentin (NEURONTIN) 800 MG tablet Take 800 mg by mouth 3 (three) times daily.  07/02/18  Yes [provider]  lamoTRIgine (LAMICTAL) 150 MG tablet Take 150 mg by mouth 2 (two) times daily.    Yes [provider]  MYRBETRIQ 25 MG TB24 tablet Take 25 mg by mouth daily. 07/08/21  Yes [provider]  ondansetron (ZOFRAN) 4 MG tablet Take 1 tablet (4 mg total) by mouth every 8 (eight) hours as needed for nausea or vomiting. 01/05/20  Yes Suzzanne Cloud, NP  perphenazine (TRILAFON) 4 MG tablet Take 4 mg by mouth at bedtime. 06/07/21  Yes [provider]  SUMAtriptan (IMITREX) 6 MG/0.5ML SOLN injection Inject 6 mg into the skin See admin instructions. INJECT 6 MG (0.5 ml) INTO THE SKIN AS NEEDED FOR MIGRAINE OR HEADACHE. MAY REPEAT ONCE IN 2 HOURS IF HEADACHE PERSISTS OR RECURS. MAX 2 DOSES IN 24 HOURS. 06/11/21  Yes [provider]  tiZANidine (ZANAFLEX) 4 MG tablet Take 1 tablet (4 mg total) by mouth every 6 (six) hours as needed for muscle spasms. 08/15/21  Yes Jaffe, Adam R, DO  benztropine  (COGENTIN) 0.5 MG tablet Take 0.5 mg by mouth 2 (two) times daily. Patient not taking: Reported on 11/07/2021 12/23/20   [provider]   Social History   Socioeconomic History   Marital status: Divorced    Spouse name: n/a   Number of children: 2   Years of education: 18   Highest education level: Not on file  Occupational History   Occupation: Disabled    Comment: Formerly a Pharmacist, hospital.  Tobacco Use   Smoking status: Former    Packs/day: 1.00    Years: 39.00    Total pack years: 39.00    Types: Cigarettes    Start date: 10/10/1981    Quit date: 07/18/2021    Years since quitting: 0.3   Smokeless tobacco: Never  Vaping Use   Vaping Use: Never used  Substance and Sexual Activity   Alcohol use: Never    Alcohol/week: 0.0 standard drinks of alcohol   Drug use: Never   Sexual activity: Not Currently    Partners: Male    Birth control/protection: Post-menopausal    Comment: 1st intercourse 51 yo-More than 5 partners  Other Topics Concern   Not on file  Social History Narrative   Lives at home with 1 of her two daughters   Right-handed.   2-4 cups caffeine daily.   Disabled    One story home   Social Determinants of Health   Financial Resource Strain: Low Risk  (11/02/2021)   Overall Financial Resource Strain (CARDIA)    Difficulty of Paying Living Expenses: Not hard at all  Food Insecurity: No Food Insecurity (11/02/2021)   Hunger Vital Sign    Worried About Running Out of Food in the Last Year: Never true    Ran Out of Food in the Last Year: Never true  Transportation Needs: No Transportation Needs (11/02/2021)   PRAPARE - Hydrologist (Medical): No    Lack of Transportation (Non-Medical): No  Physical Activity: Insufficiently Active (11/02/2021)   Exercise  Vital Sign    Days of Exercise per Week: 3 days    Minutes of Exercise per Session: 30 min  Stress: No Stress Concern Present (11/02/2021)   [place]    Feeling of Stress : Not at all  Social Connections: Moderately Isolated (11/02/2021)   Social Connection and Isolation Panel [NHANES]    Frequency of Communication with Friends and Family: More than three times a week    Frequency of Social Gatherings with Friends and Family: More than three times a week    Attends Religious Services: More than 4 times per year    Active Member of [employer] or Organizations: No    Attends Archivist Meetings: Never    Marital Status: Divorced  Human resources officer Violence: Not At Risk (11/02/2021)   Humiliation, Afraid, Rape, and Kick questionnaire    Fear of Current or Ex-Partner: No    Emotionally Abused: No    Physically Abused: No    Sexually Abused: No    Review of Systems Per HPI   Objective:   Vitals:   11/07/21 1105  BP: 118/80  Pulse: (!) 58  Temp: 98 F (36.7 C)  SpO2: 97%  Weight: 210 lb 12.8 oz (95.6 kg)  Height: '5\' 3"'$  (1.6 m)     Physical Exam Vitals reviewed.  Constitutional:      Appearance: Normal appearance. She is well-developed.  HENT:     Head: Normocephalic and atraumatic.  Eyes:     Conjunctiva/sclera: Conjunctivae normal.     Pupils: Pupils are equal, round, and reactive to light.  Neck:     Vascular: No carotid bruit.  Cardiovascular:     Rate and Rhythm: Normal rate and regular rhythm.     Heart sounds: Normal heart sounds.  Pulmonary:     Effort: Pulmonary effort is normal.     Breath sounds: Normal breath sounds.  Abdominal:     Palpations: Abdomen is soft. There is no pulsatile mass.     Tenderness: There is no abdominal tenderness.  Musculoskeletal:     Right lower leg: No edema.     Left lower leg: No edema.  Skin:    General: Skin is warm and dry.  Neurological:     Mental Status: She is alert and oriented to person, place, and time.  Psychiatric:        Mood and Affect: Mood normal.        Behavior: Behavior normal.         Assessment & Plan:  BRITNEY NEWSTROM is a 51 y.o. female . Chronic obstructive pulmonary disease, unspecified COPD type ([place])  -Doing well on Symbicort, commended on smoking cessation.  Handout given on information to help with the challenges of quitting smoking including section on dealing with cravings and food.  Continue Symbicort, albuterol as needed, flu vaccine given today.  Need for influenza vaccination - Plan: Flu Vaccine QUAD 6+ mos PF IM (Fluarix Quad PF)  Class 2 obesity with body mass index (BMI) of 37.0 to 37.9 in adult, unspecified obesity type, unspecified whether serious comorbidity present - Plan: Amb ref to Medical Nutrition Therapy-MNT, Hemoglobin A1c History of prediabetes - Plan: Amb ref to Medical Nutrition Therapy-MNT, Hemoglobin A1c  -Some weight gain as above with quitting smoking.  Handout given.  Will refer to nutritionist as she reports some need to discuss various diets and ways to manage weight.  Check A1c, CMP.  Hypertriglyceridemia -  Plan: Comprehensive metabolic panel, Lipid panel  -Hypertriglyceridemia previously.  Repeat labs, medication adjustment/plan adjustment accordingly.  No orders of the defined types were placed in this encounter.  Patient Instructions  Keep up the good work with avoiding tobacco.  I will place a referral to nutritionist to help discuss other ways to minimize weight gain.  Continue to work with 1 800 quit now and your therapist to discuss techniques to minimize cravings for tobacco.  See other information below.  I am checking a prediabetes/diabetes screening test today as well as the cholesterol levels that were only borderline previously.  Follow-up in 6 months but please let me know if there are questions sooner.  Take care!  Managing the Challenge of Quitting Smoking Quitting smoking is a physical and mental challenge. You may have cravings, withdrawal symptoms, and temptation to smoke. Before quitting, work with your  health care provider to make a plan that can help you manage quitting. Making a plan before you quit may keep you from smoking when you have the urge to smoke while trying to quit. How to manage lifestyle changes Managing stress Stress can make you want to smoke, and wanting to smoke may cause stress. It is important to find ways to manage your stress. You could try some of the following: Practice relaxation techniques. Breathe slowly and deeply, in through your nose and out through your mouth. Listen to music. Soak in a bath or take a shower. Imagine a peaceful place or vacation. Get some support. Talk with family or friends about your stress. Join a support group. Talk with a counselor or therapist. Get some physical activity. Go for a walk, run, or bike ride. Play a favorite sport. Practice yoga.  Medicines Talk with your health care provider about medicines that might help you deal with cravings and make quitting easier for you. Relationships Social situations can be difficult when you are quitting smoking. To manage this, you can: Avoid parties and other social situations where people might be smoking. Avoid alcohol. Leave right away if you have the urge to smoke. Explain to your family and friends that you are quitting smoking. Ask for support and let them know you might be a bit grumpy. Plan activities where smoking is not an option. General instructions Be aware that many people gain weight after they quit smoking. However, not everyone does. To keep from gaining weight, have a plan in place before you quit, and stick to the plan after you quit. Your plan should include: Eating healthy snacks. When you have a craving, it may help to: Eat popcorn, or try carrots, celery, or other cut vegetables. Chew sugar-free gum. Changing how you eat. Eat small portion sizes at meals. Eat 4-6 small meals throughout the day instead of 1-2 large meals a day. Be mindful when you eat. You  should avoid watching television or doing other things that might distract you as you eat. Exercising regularly. Make time to exercise each day. If you do not have time for a long workout, do short bouts of exercise for 5-10 minutes several times a day. Do some form of strengthening exercise, such as weight lifting. Do some exercise that gets your heart beating and causes you to breathe deeply, such as walking fast, running, swimming, or biking. This is very important. Drinking plenty of water or other low-calorie or no-calorie drinks. Drink enough fluid to keep your urine pale yellow.  How to recognize withdrawal symptoms Your body and mind may experience  discomfort as you try to get used to not having nicotine in your system. These effects are called withdrawal symptoms. They may include: Feeling hungrier than normal. Having trouble concentrating. Feeling irritable or restless. Having trouble sleeping. Feeling depressed. Craving a cigarette. These symptoms may surprise you, but they are normal to have when quitting smoking. To manage withdrawal symptoms: Avoid places, people, and activities that trigger your cravings. Remember why you want to quit. Get plenty of sleep. Avoid coffee and other drinks that contain caffeine. These may worsen some of your symptoms. How to manage cravings Come up with a plan for how to deal with your cravings. The plan should include the following: A definition of the specific situation you want to deal with. An activity or action you will take to replace smoking. A clear idea for how this action will help. The name of someone who could help you with this. Cravings usually last for 5-10 minutes. Consider taking the following actions to help you with your plan to deal with cravings: Keep your mouth busy. Chew sugar-free gum. Suck on hard candies or a straw. Brush your teeth. Keep your hands and body busy. Change to a different activity right  away. Squeeze or play with a ball. Do an activity or a hobby, such as making bead jewelry, practicing needlepoint, or working with wood. Mix up your normal routine. Take a short exercise break. Go for a quick walk, or run up and down stairs. Focus on doing something kind or helpful for someone else. Call a friend or family member to talk during a craving. Join a support group. Contact a quitline. Where to find support To get help or find a support group: Call the [facility]'s Smoking Quitline: 1-800-QUIT-NOW 815 745 5479) Text QUIT to SmokefreeTXT: 063016 Where to find more information Visit these websites to find more information on quitting smoking: U.S. Department of Health and Human Services: www.smokefree.gov American Lung Association: www.freedomfromsmoking.org Centers for Disease Control and Prevention (CDC): http://www.wolf.info/ American Heart Association: www.heart.org Contact a health care provider if: You want to change your plan for quitting. The medicines you are taking are not helping. Your eating feels out of control or you cannot sleep. You feel depressed or become very anxious. Summary Quitting smoking is a physical and mental challenge. You will face cravings, withdrawal symptoms, and temptation to smoke again. Preparation can help you as you go through these challenges. Try different techniques to manage stress, handle social situations, and prevent weight gain. You can deal with cravings by keeping your mouth busy (such as by chewing gum), keeping your hands and body busy, calling family or friends, or contacting a quitline for people who want to quit smoking. You can deal with withdrawal symptoms by avoiding places where people smoke, getting plenty of rest, and avoiding drinks that contain caffeine. This information is not intended to replace advice given to you by your health care provider. Make sure you discuss any questions you have with your health care  provider. Document Revised: 12/31/2020 Document Reviewed: 12/31/2020 Elsevier Patient Education  2023 [place],   Merri Ray, MD [place], [facility] 11/07/21 12:27 PM

## 2021-11-07 NOTE — Patient Instructions (Addendum)
Keep up the good work with avoiding tobacco.  I will place a referral to nutritionist to help discuss other ways to minimize weight gain.  Continue to work with 1 800 quit now and your therapist to discuss techniques to minimize cravings for tobacco.  See other information below.  I am checking a prediabetes/diabetes screening test today as well as the cholesterol levels that were only borderline previously.  Follow-up in 6 months but please let me know if there are questions sooner.  Take care!  Managing the Challenge of Quitting Smoking Quitting smoking is a physical and mental challenge. You may have cravings, withdrawal symptoms, and temptation to smoke. Before quitting, work with your health care provider to make a plan that can help you manage quitting. Making a plan before you quit may keep you from smoking when you have the urge to smoke while trying to quit. How to manage lifestyle changes Managing stress Stress can make you want to smoke, and wanting to smoke may cause stress. It is important to find ways to manage your stress. You could try some of the following: Practice relaxation techniques. Breathe slowly and deeply, in through your nose and out through your mouth. Listen to music. Soak in a bath or take a shower. Imagine a peaceful place or vacation. Get some support. Talk with family or friends about your stress. Join a support group. Talk with a counselor or therapist. Get some physical activity. Go for a walk, run, or bike ride. Play a favorite sport. Practice yoga.  Medicines Talk with your health care provider about medicines that might help you deal with cravings and make quitting easier for you. Relationships Social situations can be difficult when you are quitting smoking. To manage this, you can: Avoid parties and other social situations where people might be smoking. Avoid alcohol. Leave right away if you have the urge to smoke. Explain to your family and friends  that you are quitting smoking. Ask for support and let them know you might be a bit grumpy. Plan activities where smoking is not an option. General instructions Be aware that many people gain weight after they quit smoking. However, not everyone does. To keep from gaining weight, have a plan in place before you quit, and stick to the plan after you quit. Your plan should include: Eating healthy snacks. When you have a craving, it may help to: Eat popcorn, or try carrots, celery, or other cut vegetables. Chew sugar-free gum. Changing how you eat. Eat small portion sizes at meals. Eat 4-6 small meals throughout the day instead of 1-2 large meals a day. Be mindful when you eat. You should avoid watching television or doing other things that might distract you as you eat. Exercising regularly. Make time to exercise each day. If you do not have time for a long workout, do short bouts of exercise for 5-10 minutes several times a day. Do some form of strengthening exercise, such as weight lifting. Do some exercise that gets your heart beating and causes you to breathe deeply, such as walking fast, running, swimming, or biking. This is very important. Drinking plenty of water or other low-calorie or no-calorie drinks. Drink enough fluid to keep your urine pale yellow.  How to recognize withdrawal symptoms Your body and mind may experience discomfort as you try to get used to not having nicotine in your system. These effects are called withdrawal symptoms. They may include: Feeling hungrier than normal. Having trouble concentrating. Feeling irritable or  restless. Having trouble sleeping. Feeling depressed. Craving a cigarette. These symptoms may surprise you, but they are normal to have when quitting smoking. To manage withdrawal symptoms: Avoid places, people, and activities that trigger your cravings. Remember why you want to quit. Get plenty of sleep. Avoid coffee and other drinks that  contain caffeine. These may worsen some of your symptoms. How to manage cravings Come up with a plan for how to deal with your cravings. The plan should include the following: A definition of the specific situation you want to deal with. An activity or action you will take to replace smoking. A clear idea for how this action will help. The name of someone who could help you with this. Cravings usually last for 5-10 minutes. Consider taking the following actions to help you with your plan to deal with cravings: Keep your mouth busy. Chew sugar-free gum. Suck on hard candies or a straw. Brush your teeth. Keep your hands and body busy. Change to a different activity right away. Squeeze or play with a ball. Do an activity or a hobby, such as making bead jewelry, practicing needlepoint, or working with wood. Mix up your normal routine. Take a short exercise break. Go for a quick walk, or run up and down stairs. Focus on doing something kind or helpful for someone else. Call a friend or family member to talk during a craving. Join a support group. Contact a quitline. Where to find support To get help or find a support group: Call the [facility]'s Smoking Quitline: 1-800-QUIT-NOW 407-659-1152) Text QUIT to SmokefreeTXT: 448185 Where to find more information Visit these websites to find more information on quitting smoking: U.S. Department of Health and Human Services: www.smokefree.gov American Lung Association: www.freedomfromsmoking.org Centers for Disease Control and Prevention (CDC): http://www.wolf.info/ American Heart Association: www.heart.org Contact a health care provider if: You want to change your plan for quitting. The medicines you are taking are not helping. Your eating feels out of control or you cannot sleep. You feel depressed or become very anxious. Summary Quitting smoking is a physical and mental challenge. You will face cravings, withdrawal symptoms, and  temptation to smoke again. Preparation can help you as you go through these challenges. Try different techniques to manage stress, handle social situations, and prevent weight gain. You can deal with cravings by keeping your mouth busy (such as by chewing gum), keeping your hands and body busy, calling family or friends, or contacting a quitline for people who want to quit smoking. You can deal with withdrawal symptoms by avoiding places where people smoke, getting plenty of rest, and avoiding drinks that contain caffeine. This information is not intended to replace advice given to you by your health care provider. Make sure you discuss any questions you have with your health care provider. Document Revised: 12/31/2020 Document Reviewed: 12/31/2020 Elsevier Patient Education  [place].

## 2021-11-08 ENCOUNTER — Encounter: Payer: Self-pay | Admitting: Family Medicine

## 2021-11-10 ENCOUNTER — Encounter: Payer: Medicare Other | Attending: Family Medicine | Admitting: Dietician

## 2021-11-10 ENCOUNTER — Encounter: Payer: Self-pay | Admitting: Dietician

## 2021-11-10 DIAGNOSIS — R7303 Prediabetes: Secondary | ICD-10-CM | POA: Insufficient documentation

## 2021-11-10 DIAGNOSIS — Z87898 Personal history of other specified conditions: Secondary | ICD-10-CM | POA: Insufficient documentation

## 2021-11-10 DIAGNOSIS — Z6837 Body mass index (BMI) 37.0-37.9, adult: Secondary | ICD-10-CM | POA: Insufficient documentation

## 2021-11-10 DIAGNOSIS — E669 Obesity, unspecified: Secondary | ICD-10-CM | POA: Diagnosis present

## 2021-11-10 DIAGNOSIS — Z713 Dietary counseling and surveillance: Secondary | ICD-10-CM | POA: Diagnosis not present

## 2021-11-10 NOTE — Patient Instructions (Addendum)
Aim for 150 minutes of physical activity weekly. Make physical activity a part of your week. Try to include at least 30 minutes of physical activity 5 days each week or at least 150 minutes per week. Regular physical activity promotes overall health-including helping to reduce risk for heart disease and diabetes, promoting mental health, and helping [place] sleep better.    M/W/F Walk for 15-20 minutes, build up more time as you go.    Eat more Non-Starchy Vegetables  Aim to eat within 1-2 hours of waking up, and every 3-5 hours following.   Easy breakfast: -peanut butter on toast with a piece of fruit  Easy lunch: -soup with microwave frozen veggies  Balanced snack for work: choose at least a carb and protein! Carbohydrates:  -fruit: banana, blueberries, strawberries, grapes, pear, nectarines -whole grain crackers (triscuits, wheat thins, whole wheat ritz, sun chips),  Proteins: -peanut butter, tuna salad packet, chicken salad packet, greek yogurt, cottage cheese, cheese stick, babybell cheese  For GERD limit:  Fried food Fast food Pizza Potato chips and other processed snacks Chili powder and pepper (white, black, cayenne) Fatty meats such as bacon and sausage Cheese Tomato-based sauces Citrus fruits Chocolate Peppermint Carbonated beverages

## 2021-11-10 NOTE — Progress Notes (Signed)
Medical Nutrition Therapy  Appointment Start time:  [phone]  Appointment End time:  80  Primary concerns today: Pt states her weight has been up and down since she had her daughter 20+ years ago. Quit smoking in June and weight has crept up and pt wants to get back to feeling healthy.   Referral diagnosis: E66.9 Preferred learning style: no preference indicated Learning readiness: ready   NUTRITION ASSESSMENT   Anthropometrics  Ht: 63in Wt: 209.3lbs  Clinical Medical Hx: allergies, anxiety, asthma, COPD, depression, GERD, prediabetes Medications: reviewed Labs: 11/07/21: A1C 6.1% Notable Signs/Symptoms: stomach pain, acid reflux. Food Allergies: none  Lifestyle & Dietary Hx  Pt states she lives with her parents who are in their 45's and helps take care of them and their 5 animals which adds to stress to her life  Pt had pneumonia in June and was hospitalized. Pt quit smoking in June. Weight has gone up 20 lbs since and pt states she has been craving candy and eats candy bars daily.   Pt states she quit eating candy bars and sugar this week. She states she is still not eating right but has made changes but still skips meals.   Pt states she doesn't drink plain water. She drinks flavored (infused circle bottle).   Pt states she feels sick if she eats breakfast, and states sometimes she eats lunch depending on if her dad cooks or just has cereal. If she's not working (M,W,F) she eat. IF working, skips dinner.   Pt states she used to drink 3-4 cokes per day. Quit a week ago.  Pt reports she does not like physical activity but is open to try walking.    Estimated daily fluid intake: 48 oz Supplements: MVI Sleep: 10-12 hours, feels very tired, works late and sleeps in.  Stress / self-care: moderate stress Current average weekly physical activity: ADLs, [phone] steps at work  24-Hr Dietary Recall First Meal: skips Snack: none Second Meal: 1-2pm: bowl of special K OR peanut  butter jelly toast OR waffles OR soup Snack: none Third Meal: IF OFF: beef and broccoli with fried rice OR parmesan chicken with vegetable and potatoes OR IF WORKING: skips and has candy bar Snack: none Beverages: 3 infused water bottles, unsweet tea, coffee with flavored cream   NUTRITION DIAGNOSIS  NB-1.1 Food and nutrition-related knowledge deficit As related to prior nutrition education.  As evidenced by pt report and diet history.   NUTRITION INTERVENTION  Nutrition education (E-1) on the following topics:  Prediabetes Insulin resistance Weight cycling Balance of carbohydrate, protein, fruits and vegetables MyPlate Importance of physical activity Eating consistently and avoiding skipping meals Building balanced snacks Hydration needs  Handouts Provided Include  Dish Up a Healthy Meal Balanced Snacks  Learning Style & Readiness for Change Teaching method utilized: Visual & Auditory  Demonstrated degree of understanding via: Teach Back  Barriers to learning/adherence to lifestyle change: none  Goals Established by Pt Aim for 150 minutes of physical activity weekly.   M/W/F Walk for 15-20 minutes, build up more time as you go.    Eat more non-starchy vegetables  Aim to eat within 1-2 hours of waking up, and every 3-5 hours following.   Balanced snack for work: choose at least a carb and protein!  Limit foods that worsen GERD symptoms (see list in summary)  MONITORING & EVALUATION Dietary intake, weekly physical activity, and follow up in 2 months.  Next Steps  Patient is to call for questions.

## 2021-11-15 ENCOUNTER — Encounter: Payer: Self-pay | Admitting: Radiology

## 2021-11-15 ENCOUNTER — Ambulatory Visit (INDEPENDENT_AMBULATORY_CARE_PROVIDER_SITE_OTHER): Payer: Medicare Other | Admitting: Radiology

## 2021-11-15 VITALS — BP 108/74 | Ht 62.5 in | Wt 210.0 lb

## 2021-11-15 DIAGNOSIS — G8929 Other chronic pain: Secondary | ICD-10-CM | POA: Diagnosis not present

## 2021-11-15 DIAGNOSIS — R1031 Right lower quadrant pain: Secondary | ICD-10-CM

## 2021-11-15 LAB — URINALYSIS, COMPLETE W/RFL CULTURE
Bacteria, UA: NONE SEEN /HPF
Bilirubin Urine: NEGATIVE
Casts: NONE SEEN /LPF
Crystals: NONE SEEN /HPF
Glucose, UA: NEGATIVE
Hgb urine dipstick: NEGATIVE
Hyaline Cast: NONE SEEN /LPF
Ketones, ur: NEGATIVE
Leukocyte Esterase: NEGATIVE
Nitrites, Initial: NEGATIVE
Protein, ur: NEGATIVE
RBC / HPF: NONE SEEN /HPF (ref 0–2)
Specific Gravity, Urine: 1.008 (ref 1.001–1.035)
WBC, UA: NONE SEEN /HPF (ref 0–5)
Yeast: NONE SEEN /HPF
pH: 5.5 (ref 5.0–8.0)

## 2021-11-15 LAB — NO CULTURE INDICATED

## 2021-11-15 NOTE — Progress Notes (Signed)
   Lauren Chapman Nov 08, 1970 [id number]   History:  51 y.o. G3P2 presents with c/o RLQ pain since hysterectomy in 2021.Hyst was done for persistent post menopausal bleeding, benign pathology. Pain is becoming more constant. No contributing factors. Worse with palpation. Described pain as a 'pulling'. No bowel or bladder changes.  Last colonoscopy showed polyps in 2022, repeat 3 years.   Past medical history, past surgical history, family history and social history were all reviewed and documented in the EPIC chart.  ROS:  A ROS was performed and pertinent positives and negatives are included.  Exam:  Vitals:   11/15/21 1038  BP: 108/74  Weight: 210 lb (95.3 kg)  Height: 5' 2.5" (1.588 m)   Body mass index is 37.8 kg/m.  General appearance:  Normal  Abdominal  Soft,without masses, guarding or rebound. +tenderness RLQ  Liver/spleen:  No organomegaly noted  Hernia:  None appreciated  Skin  Inspection:  Grossly normal Genitourinary   Inguinal/mons:  Normal without inguinal adenopathy  External genitalia:  Normal appearing vulva with no masses, tenderness, or lesions  BUS/Urethra/Skene's glands:  Normal  Vagina:  Normal appearing with normal color and discharge, no lesions. Atrophy mild  Cervix:  absent  Uterus:  absent  Adnexa/parametria:  ovaries absent, tender with palpation RLQ  Anus and perineum: Normal    Patient informed chaperone available to be present for breast and pelvic exam. Patient has requested no chaperone to be present. Patient has been advised what will be completed during breast and pelvic exam.   Assessment/Plan:    Chronic RLQ pain - Urinalysis,Complete w/RFL Culture - US Transvaginal Non-OB; Future     Rubbie Battiest B WHNP-BC 10:57 AM 11/15/2021

## 2021-11-28 ENCOUNTER — Ambulatory Visit: Payer: Medicare Other | Admitting: Family Medicine

## 2021-12-06 ENCOUNTER — Encounter: Payer: Self-pay | Admitting: Nurse Practitioner

## 2021-12-06 ENCOUNTER — Ambulatory Visit (INDEPENDENT_AMBULATORY_CARE_PROVIDER_SITE_OTHER): Payer: Medicare Other | Admitting: Nurse Practitioner

## 2021-12-06 VITALS — BP 98/70 | HR 61 | Temp 98.2°F | Ht 62.5 in | Wt 215.4 lb

## 2021-12-06 DIAGNOSIS — J4540 Moderate persistent asthma, uncomplicated: Secondary | ICD-10-CM | POA: Diagnosis not present

## 2021-12-06 DIAGNOSIS — Z720 Tobacco use: Secondary | ICD-10-CM | POA: Diagnosis not present

## 2021-12-06 NOTE — Patient Instructions (Addendum)
Continue Albuterol inhaler 2 puffs or 3 mL neb every 6 hours as needed for shortness of breath or wheezing. Notify if symptoms persist despite rescue inhaler/neb use. Continue nexium 20 mg Twice daily  Continue Symbicort 2 puffs Twice daily. Brush tongue and rinse mouth afterwards   Follow up in 6 months with Dr Ander Slade or Alanson Aly. If symptoms do not improve or worsen, please contact office for sooner follow up or seek emergency care.

## 2021-12-06 NOTE — Assessment & Plan Note (Signed)
Former smoker. She quit 06/2021. Enrolled in the lung cancer screening program; scan was August 2023 and Lung RADS 2.

## 2021-12-06 NOTE — Progress Notes (Signed)
$'@Patient'h$  ID: Lauren Chapman, female    DOB: [birth year], 51 y.o.   MRN: [id number]  Chief Complaint  Patient presents with   Follow-up    Occass. Sob, denies cough/wheezing    Referring provider: Wendie Agreste, MD  HPI: 51 year old female, former smoker (Quit June 2023) followed for shortness of breath and emphysema.  She is a patient of Lauren Chapman and last seen in office 09/05/2021 by Lauren Chapman. Past medical history significant for bipolar disorder, OSA on CPAP, obesity, migraines, GERD, anxiety.  She was recently hospitalized for acute hypoxic respiratory failure related to CAP from 07/18/2021 to 07/23/2021.  COVID/flu negative.  She was treated with Rocephin and Zithromax along with IV Solu-Medrol.  She was discharged on supplemental oxygen at 2 to 4 L and prednisone 40 mg for 7 days.  She was also prescribed Symbicort for maintenance and advised to follow-up with pulmonary outpatient.  TEST/EVENTS:  09/09/2018 PFTs: FVC 82, FEV1 84, ratio 82, TLC 102, DLCOcor 88. No BD. Normal PFTs 07/18/2021 CXR: questionable mild lingular pneumonia; clear upper lobes 07/31/2021 CXR 1 view: subtle opacity at lateral left base, similar to prior and likely atelectasis, scarring or resolving pna.  09/05/2021 PFTs: FVC 56, FEV1 58, ratio 82, TLC 83, DLCOcor 76. +21% BD response. Moderate obstruction with reversibility  09/07/2021 LDCT chest: atherosclerosis. Mild emphysema. Small solid nodule in the RUL, benign appearance. Lung RADS 2  08/02/2021: Lauren Chapman with Lauren Hodgkins Chapman for hospital follow up. She reports that she has been slowly recovering since being discharged. She is still feeling a little fatigued and has an occasional congested, non-productive cough; however, both of these are improving. She was able to come off of her oxygen and has been in the 90's on room air. Her breathing feels much better on Symbicort and she hasn't had to use her albuterol but 2-3 times since being discharged. Prior to admission, she was  using it almost daily. She denies any fevers, night sweats, hemoptysis, anorexia, weight loss, wheezing. She has quit smoking and is currently using nicotine patches provided by her insurance to help her remain smoke free. She is taking mucinex for chest congestion/cough and using her Symbicort Twice daily.  PFTs ordered for further evaluation.  Referred to lung cancer screening program. She did go back to the ED on 7/9 after a syncopal episode. She had developed some joint pain and headache prior to the event. Respiratory status was stable with O2 98% on room air and ED workup was unremarkable. Suspect related to orthostatic or vasovagal syncope and possible from dehydration - treated with IV fluids and headache cocktail and discharged home. She reports feeling better since with no recurrent episodes. She has follow up scheduled with her PCP on Friday.  She is experiencing some itching and discomfort in her vaginal area and is concerned she may have developed a yeast infection from the antibiotics she was on. She has had a small amount of discharge. No odor, bleeding or pain. No urinary symptoms.  Treated with fluconazole x1 and instructed to follow-up with PCP if symptoms do not improve.  09/05/2021: OV with Lauren Miler Chapman for follow-up after undergoing pulmonary function testing.  She reports that she has been doing well since she was seen last.  Feels like all of her symptoms have resolved.  She no longer has a cough and the chest congestion has gone away.  Breathing feels like it is relatively stable.  She did stop using Symbicort and is using albuterol every  other day as she still gets winded with some activities.  Does not notice any significant wheezing.  Does also occasionally have some chest tightness with the shortness of breath.  Denies any lower extremity swelling, hemoptysis, fevers, night sweats, palpitations.  She has upcoming CT chest in 2 days for lung cancer screening.  No concerns or complaints  today.   12/06/2021: Today - follow up Patient presents today for follow up. She has been doing well since she was here last. Feels like she is better since starting on the Symbicort consistently. Activity tolerance has improved. No significant cough or chest congestion. Hasn't noticed wheezing. She has been able to decrease frequency of albuterol; previously was using it multiple times a day. Since starting Symbicort, she's down to using it once a week, at max. No concerns or complaints today.   Allergies  Allergen Reactions   Codeine Anaphylaxis and Other (See Comments)    Cannot have ANYTHING with codeine    Immunization History  Administered Date(s) Administered   Influenza Inj Mdck Quad Pf 01/03/2019   Influenza Split 10/11/2011   Influenza,inj,Quad PF,6+ Mos 12/04/2017, 12/01/2019, 11/07/2021   Influenza-Unspecified 01/03/2019   PFIZER(Purple Top)SARS-COV-2 Vaccination 04/19/2019, 05/10/2019, 01/22/2020, 10/26/2020   PNEUMOCOCCAL CONJUGATE-20 08/02/2020   Tdap 10/16/2018   Zoster Recombinat (Shingrix) 05/31/2020, 08/02/2020    Past Medical History:  Diagnosis Date   Allergy    Anxiety    Bipolar affective psychosis ([place])    Complication of anesthesia    " Acting silly- waving at everyone"-Giddy   Depression    Emphysema lung ([place])    inhaler prn,  followed by pcp and pulmonologist-- dr Lauren Chapman   Family history of adverse reaction to anesthesia    Father had rheumatic fever, and died on operating table.   GERD (gastroesophageal reflux disease)    Kidney cysts 01/24/2020   Right Kidney   Migraine    Moderate persistent asthma 09/05/2021   Smokers' cough ([place])    per pt not productive   SUI (stress urinary incontinence, female)    Tubular adenoma of colon 07/08/2020   Wears glasses     Tobacco History: Social History   Tobacco Use  Smoking Status Former   Packs/day: 1.00   Years: 39.00   Total pack years: 39.00   Types: Cigarettes   Start date: 10/10/1981    Quit date: 07/18/2021   Years since quitting: 0.3   Passive exposure: Never  Smokeless Tobacco Never   Counseling given: Not Answered   Outpatient Medications Prior to Visit  Medication Sig Dispense Refill   albuterol (VENTOLIN HFA) 108 (90 Base) MCG/ACT inhaler Inhale 2 puffs into the lungs every 4 (four) hours as needed for wheezing or shortness of breath. (Patient taking differently: Inhale 2 puffs into the lungs every hour as needed for wheezing or shortness of breath.) 1 each 1   ARIPiprazole (ABILIFY) 30 MG tablet Take 30 mg by mouth at bedtime.     budesonide-formoterol (SYMBICORT) 160-4.5 MCG/ACT inhaler Inhale 2 puffs into the lungs in the morning and at bedtime. 1 each 6   busPIRone (BUSPAR) 15 MG tablet Take 15 mg by mouth 3 (three) times daily.     citalopram (CELEXA) 20 MG tablet Take 30 mg by mouth daily.     EPINEPHrine 0.3 mg/0.3 mL IJ SOAJ injection Inject 0.3 mg into the muscle as needed for anaphylaxis. 1 each 0   esomeprazole (NEXIUM) 20 MG capsule Take 1 capsule (20 mg total) by mouth 2 (two)  times daily before a meal. 60 capsule 1   gabapentin (NEURONTIN) 800 MG tablet Take 800 mg by mouth 3 (three) times daily.      lamoTRIgine (LAMICTAL) 150 MG tablet Take 150 mg by mouth 2 (two) times daily.      MYRBETRIQ 50 MG TB24 tablet Take 50 mg by mouth daily.     ondansetron (ZOFRAN) 4 MG tablet Take 1 tablet (4 mg total) by mouth every 8 (eight) hours as needed for nausea or vomiting. 20 tablet 6   perphenazine (TRILAFON) 4 MG tablet Take 4 mg by mouth at bedtime.     SUMAtriptan (IMITREX) 6 MG/0.5ML SOLN injection Inject 6 mg into the skin See admin instructions. INJECT 6 MG (0.5 ml) INTO THE SKIN AS NEEDED FOR MIGRAINE OR HEADACHE. MAY REPEAT ONCE IN 2 HOURS IF HEADACHE PERSISTS OR RECURS. MAX 2 DOSES IN 24 HOURS.     tiZANidine (ZANAFLEX) 4 MG tablet Take 1 tablet (4 mg total) by mouth every 6 (six) hours as needed for muscle spasms. 30 tablet 5   MYRBETRIQ 25 MG TB24  tablet Take 25 mg by mouth daily. (Patient not taking: Reported on 12/06/2021)     No facility-administered medications prior to visit.     Review of Systems:   Constitutional: No weight loss or gain, night sweats, fevers, chills, fatigue, lassitude HEENT: No headaches, difficulty swallowing, tooth/dental problems, or sore throat. No sneezing, itching, ear ache, nasal congestion, or post nasal drip CV:  No chest pain, orthopnea, PND, swelling in lower extremities, anasarca, dizziness, palpitations, syncope Resp: +shortness of breath with exertion (improved). No cough. No excess mucus or change in color of mucus. No hemoptysis. No wheezing.  No chest wall deformity GU: No dysuria, change in color of urine, urgency or frequency.  No flank pain, no hematuria  Skin: No rash, lesions, ulcerations MSK:  No joint pain or swelling.  No decreased range of motion.  No back pain. Neuro: No dizziness or lightheadedness.  Psych: No depression or anxiety. Mood stable.     Physical Exam:  BP 98/70 (BP Location: Right Arm, Cuff Size: Large)   Pulse 61   Temp 98.2 F (36.8 C) (Temporal)   Ht 5' 2.5" (1.588 m)   Wt 215 lb 6.4 oz (97.7 kg)   LMP 05/02/2019 Comment: irregular cycles  SpO2 98%   BMI 38.77 kg/m   GEN: Pleasant, interactive, well-appearing; obese; in no acute distress. HEENT:  Normocephalic and atraumatic. PERRLA. Sclera white. Nasal turbinates pink, moist and patent bilaterally. No rhinorrhea present. Oropharynx pink and moist, without exudate or edema. No lesions, ulcerations, or postnasal drip.  NECK:  Supple w/ fair ROM. No JVD present. Normal carotid impulses w/o bruits. Thyroid symmetrical with no goiter or nodules palpated. No lymphadenopathy.   CV: RRR, no m/r/g, no peripheral edema. Pulses intact, +2 bilaterally. No cyanosis, pallor or clubbing. PULMONARY:  Unlabored, regular breathing. Clear bilaterally A&P w/o wheezes/rales/rhonchi. No accessory muscle use. No dullness to  percussion. GI: BS present and normoactive. Soft, non-tender to palpation. No organomegaly or masses detected. GU: Deferred by pt. MSK: No erythema, warmth or tenderness. Cap refil <2 sec all extrem. No deformities or joint swelling noted.  Neuro: A/Ox3. No focal deficits noted.   Skin: Warm, no lesions or rashe Psych: Normal affect and behavior. Judgement and thought content appropriate.     Lab Results:  CBC    Component Value Date/Time   WBC 9.2 10/30/2021 1743   RBC 4.16 10/30/2021 1743  HGB 12.4 10/30/2021 1743   HGB 15.2 03/24/2019 1510   HCT 37.5 10/30/2021 1743   HCT 44.8 03/24/2019 1510   PLT 235 10/30/2021 1743   PLT 241 03/24/2019 1510   MCV 90.1 10/30/2021 1743   MCV 94 03/24/2019 1510   MCH 29.8 10/30/2021 1743   MCHC 33.1 10/30/2021 1743   RDW 13.4 10/30/2021 1743   RDW 13.1 03/24/2019 1510   LYMPHSABS 2.3 10/30/2021 1743   LYMPHSABS 2.6 03/24/2019 1510   MONOABS 0.4 10/30/2021 1743   EOSABS 0.1 10/30/2021 1743   EOSABS 0.2 03/24/2019 1510   BASOSABS 0.0 10/30/2021 1743   BASOSABS 0.1 03/24/2019 1510    BMET    Component Value Date/Time   NA 138 11/07/2021 1244   NA 143 03/24/2019 1510   K 4.8 11/07/2021 1244   CL 103 11/07/2021 1244   CO2 22 11/07/2021 1244   GLUCOSE 72 11/07/2021 1244   BUN 11 11/07/2021 1244   BUN 10 03/24/2019 1510   CREATININE 1.00 11/07/2021 1244   CREATININE 1.12 (H) 08/17/2015 1608   CALCIUM 9.3 11/07/2021 1244   GFRNONAA >60 10/30/2021 1743   GFRAA 71 03/24/2019 1510    BNP No results found for: "BNP"   Imaging:  No results found.        Latest Ref Rng & Units 09/05/2021   10:37 AM 09/09/2018    2:07 PM  PFT Results  FVC-Pre L 1.93  2.91   FVC-Predicted Pre % 56  82   FVC-Post L 2.32  3.04   FVC-Predicted Post % 67  85   Pre FEV1/FVC % % 82  81   Post FEV1/FCV % % 82  82   FEV1-Pre L 1.58  2.36   FEV1-Predicted Pre % 58  84   FEV1-Post L 1.91  2.50   DLCO uncorrected ml/min/mmHg 15.60  19.28    DLCO UNC% % 76  91   DLCO corrected ml/min/mmHg 15.55  18.63   DLCO COR %Predicted % 76  88   DLVA Predicted % 91  102   TLC L 4.17  5.18   TLC % Predicted % 83  102   RV % Predicted % 110  130     No results found for: "NITRICOXIDE"      Assessment & Plan:   Moderate persistent asthma Compensated on current regimen. Maintained on ICS/LABA with Symbicort. Good response to this. No changes made today.  Patient Instructions  Continue Albuterol inhaler 2 puffs or 3 mL neb every 6 hours as needed for shortness of breath or wheezing. Notify if symptoms persist despite rescue inhaler/neb use. Continue nexium 20 mg Twice daily  Continue Symbicort 2 puffs Twice daily. Brush tongue and rinse mouth afterwards   Follow up in 6 months with Dr Ander Slade or Alanson Aly. If symptoms do not improve or worsen, please contact office for sooner follow up or seek emergency care.    Tobacco abuse Former smoker. She quit 06/2021. Enrolled in the lung cancer screening program; scan was August 2023 and Lung RADS 2.     I spent 25 minutes of dedicated to the care of this patient on the date of this encounter to include pre-visit review of records, face-to-face time with the patient discussing conditions above, post visit ordering of testing, clinical documentation with the electronic health record, making appropriate referrals as documented, and communicating necessary findings to members of the patients care team.  Clayton Bibles, Chapman 12/06/2021  Pt aware and understands  Chapman's role.

## 2021-12-06 NOTE — Assessment & Plan Note (Addendum)
Compensated on current regimen. Maintained on ICS/LABA with Symbicort. Good response to this. No changes made today.  Patient Instructions  Continue Albuterol inhaler 2 puffs or 3 mL neb every 6 hours as needed for shortness of breath or wheezing. Notify if symptoms persist despite rescue inhaler/neb use. Continue nexium 20 mg Twice daily  Continue Symbicort 2 puffs Twice daily. Brush tongue and rinse mouth afterwards   Follow up in 6 months with Dr Ander Slade or Alanson Aly. If symptoms do not improve or worsen, please contact office for sooner follow up or seek emergency care.

## 2021-12-08 NOTE — Patient Instructions (Addendum)

## 2021-12-08 NOTE — Progress Notes (Signed)
PATIENT: Lauren Chapman DOB: 1970/12/06  REASON FOR VISIT: follow up HISTORY FROM: patient  Chief Complaint  Patient presents with   Follow-up    Pt in room #2 and alone. Pt here today to f/u on her OSA on CPAP.     HISTORY OF PRESENT ILLNESS:  12/12/21 ALL:  Lauren Chapman returns for follow up for OSA on CPAP. She reports doing well. She does continue to have some episodes of sleep walking followed by psychiatry. She is seen every three months. She has a Social worker she sees every 2-3 weeks. She feels that she sleeps fairly well. She is using her machine nightly for about 6 hours. She reports her cat will wake her up at night. She lives with her parents. She works part time at [employer].   She is followed by Dr Loretta Plume for migraines. She continues Vyepti infusions. She reports significant improvement on infusion.     12/09/2020 ALL: Lauren Chapman is a 51 y.o. female here today for follow up for OSA on CPAP. HST 08/2020 showed mild OSA with AHI 10.5/hr and O2 nadir 81%. She has done well with CPAP therapy. She is tolerating therapy. She is sleeping better. She may wake up during the night and not being able to go back to sleep. She will get up and drink a couple cups of coffee and usually goes back to sleep. She seems to sleep "really heavy" at this time. Sleepwalking is not occurring as frequently. She does continues to have daytime sleepiness. She is able to go to sleep in no time. She continues to follow up closely with psychiatry for schizoaffective disorder.   Please see CMA note for compliance report   HISTORY: (copied from Dr Guadelupe Sabin previous note)  Dear Dr. Carlota Raspberry,   I saw your patient, Lauren Chapman, upon your kind request in my today for initial consultation of her sleep disorder, in particular, concern for underlying obstructive sleep apnea and history of sleepwalking since childhood.  The patient is unaccompanied today.  As you know, Lauren Chapman is a 52 year old  right-handed woman with an underlying medical history of allergies, anxiety, depression, emphysema, reflux disease, kidney cyst, migraine headaches (for which she is followed by my colleague Dr. Krista Blue), and obesity, who reports snoring and excessive daytime somnolence as well as witnessed apneas.  She reports a longstanding history of sleepwalking since childhood.  She recently had a fall as she got out of bed.  She hit her head.  She was evaluated in the emergency room and had a head CT and cervical spine CT without contrast on 06/30/2020 and I reviewed the results: IMPRESSION: No acute intracranial abnormality seen.   Multilevel degenerative changes are noted. No acute abnormality seen in the cervical spine.    I reviewed your office note from 07/05/2020. Her Epworth sleepiness score is 20 out of 24, fatigue severity score is 49 out of 63.  Of note, she was on multiple potentially sedating medications including Abilify, Celexa, clonazepam, Neurontin, Vistaril, Tofranil, Lamictal, Remeron.  She is followed by psychiatry.  She was in inpatient care and is now followed as an outpatient.  She lives alone, her youngest daughter recently moved out.  She reports a longstanding history of sleepwalking, sleep talking, and sleep-related eating since childhood.  She has fallen while sleepwalking.  She tries to wake herself up but cannot do so and ends up falling and has hurt herself including her right knee some years ago.  She falls  asleep quickly and has been sleepy at the wheel.  She currently does not work.  Bedtime is generally between 930 and 10 and rise time around 5 AM.  She has been told by her daughters that she makes choking sounds and pauses while asleep.  She has woken up with a sense of gasping or snorting.  She has occasionally woken up with a headache.  She does not have any night to night nocturia, is not aware of any family history of sleep apnea.  Weight has been fluctuating.  She smokes about half a  pack per day, does not drink any alcohol and drinks caffeine in the form of coffee, about 2 cups in the mornings.   REVIEW OF SYSTEMS: Out of a complete 14 system review of symptoms, the patient complains only of the following symptoms, daytime sleepiness, sleepwalking and all other reviewed systems are negative.  ESS: 12/24  ALLERGIES: Allergies  Allergen Reactions   Codeine Anaphylaxis and Other (See Comments)    Cannot have ANYTHING with codeine    HOME MEDICATIONS: Outpatient Medications Prior to Visit  Medication Sig Dispense Refill   albuterol (VENTOLIN HFA) 108 (90 Base) MCG/ACT inhaler Inhale 2 puffs into the lungs every 4 (four) hours as needed for wheezing or shortness of breath. (Patient taking differently: Inhale 2 puffs into the lungs every hour as needed for wheezing or shortness of breath.) 1 each 1   ARIPiprazole (ABILIFY) 30 MG tablet Take 30 mg by mouth at bedtime.     budesonide-formoterol (SYMBICORT) 160-4.5 MCG/ACT inhaler Inhale 2 puffs into the lungs in the morning and at bedtime. 1 each 6   busPIRone (BUSPAR) 15 MG tablet Take 15 mg by mouth 3 (three) times daily.     citalopram (CELEXA) 20 MG tablet Take 30 mg by mouth daily.     EPINEPHrine 0.3 mg/0.3 mL IJ SOAJ injection Inject 0.3 mg into the muscle as needed for anaphylaxis. 1 each 0   esomeprazole (NEXIUM) 20 MG capsule Take 1 capsule (20 mg total) by mouth 2 (two) times daily before a meal. 60 capsule 1   gabapentin (NEURONTIN) 800 MG tablet Take 800 mg by mouth 3 (three) times daily.      lamoTRIgine (LAMICTAL) 150 MG tablet Take 150 mg by mouth 2 (two) times daily.      MYRBETRIQ 50 MG TB24 tablet Take 50 mg by mouth daily.     ondansetron (ZOFRAN) 4 MG tablet Take 1 tablet (4 mg total) by mouth every 8 (eight) hours as needed for nausea or vomiting. 20 tablet 6   perphenazine (TRILAFON) 4 MG tablet Take 4 mg by mouth at bedtime.     SUMAtriptan (IMITREX) 6 MG/0.5ML SOLN injection Inject 6 mg into the  skin See admin instructions. INJECT 6 MG (0.5 ml) INTO THE SKIN AS NEEDED FOR MIGRAINE OR HEADACHE. MAY REPEAT ONCE IN 2 HOURS IF HEADACHE PERSISTS OR RECURS. MAX 2 DOSES IN 24 HOURS.     tiZANidine (ZANAFLEX) 4 MG tablet Take 1 tablet (4 mg total) by mouth every 6 (six) hours as needed for muscle spasms. 30 tablet 5   MYRBETRIQ 25 MG TB24 tablet Take 25 mg by mouth daily. (Patient not taking: Reported on 12/12/2021)     No facility-administered medications prior to visit.    PAST MEDICAL HISTORY: Past Medical History:  Diagnosis Date   Allergy    Anxiety    Bipolar affective psychosis ([place])    Complication of anesthesia    "  Acting silly- waving at everyone"-Giddy   Depression    Emphysema lung ([place])    inhaler prn,  followed by pcp and pulmonologist-- dr Margorie John   Family history of adverse reaction to anesthesia    Father had rheumatic fever, and died on operating table.   GERD (gastroesophageal reflux disease)    Kidney cysts 01/24/2020   Right Kidney   Migraine    Moderate persistent asthma 09/05/2021   Smokers' cough ([place])    per pt not productive   SUI (stress urinary incontinence, female)    Tubular adenoma of colon 07/08/2020   Wears glasses     PAST SURGICAL HISTORY: Past Surgical History:  Procedure Laterality Date   ABDOMINAL HYSTERECTOMY N/A    [facility]reesia 11/30/2019   CARPAL TUNNEL RELEASE Right 09-12-2007   '@[facility]'$    and GANGLION CYST EXCISION   COLONOSCOPY     CYSTOSCOPY N/A 04/29/2019   Procedure: CYSTOSCOPY;  Surgeon: Joseph Pierini, MD;  Location: [facility];  Service: Gynecology;  Laterality: N/A;   DILATATION & CURETTAGE/HYSTEROSCOPY WITH MYOSURE N/A 11/13/2018   Procedure: DILATATION & CURETTAGE/HYSTEROSCOPY WITH MYOSURE;  Surgeon: Anastasio Auerbach, MD;  Location: [facility];  Service: Gynecology;  Laterality: N/A;  request 9:00am OR start time in [place] block requests one hour   [place]   02/2019   EYE SURGERY N/A    Phreesia 11/30/2019   GANGLION CYST EXCISION     R wrist   interstem      implant for overactive bladder   KNEE ARTHROSCOPY WITH ANTERIOR CRUCIATE LIGAMENT (ACL) REPAIR Right 10/2015   LAPAROSCOPIC CHOLECYSTECTOMY  1997   LAPAROSCOPIC HYSTERECTOMY Bilateral 04/29/2019   Procedure: HYSTERECTOMY TOTAL LAPAROSCOPIC, BILATERAL SALPINO-OOPHORECTOMY;  Surgeon: Joseph Pierini, MD;  Location: [facility];  Service: Gynecology;  Laterality: Bilateral;   TOOTH EXTRACTION  04/01/2019   tooth removal Bilateral 2022   4 teeth removed   UPPER GASTROINTESTINAL ENDOSCOPY      FAMILY HISTORY: Family History  Problem Relation Age of Onset   Depression Mother    Cancer Mother        Cervical   Alcohol abuse Father    Crohn's disease Sister    Stroke Brother 48   Migraines Daughter    GER disease Daughter    Depression Daughter    Migraines Daughter    GER disease Daughter    Colon cancer Neg Hx    Rectal cancer Neg Hx    Stomach cancer Neg Hx     SOCIAL HISTORY: Social History   Socioeconomic History   Marital status: Divorced    Spouse name: n/a   Number of children: 2   Years of education: 18   Highest education level: Not on file  Occupational History   Occupation: Disabled    Comment: Formerly a Pharmacist, hospital.  Tobacco Use   Smoking status: Former    Packs/day: 1.00    Years: 39.00    Total pack years: 39.00    Types: Cigarettes    Start date: 10/10/1981    Quit date: 07/18/2021    Years since quitting: 0.4    Passive exposure: Never   Smokeless tobacco: Never  Vaping Use   Vaping Use: Never used  Substance and Sexual Activity   Alcohol use: Never    Alcohol/week: 0.0 standard drinks of alcohol   Drug use: Never   Sexual activity: Not Currently    Partners: Male    Birth control/protection:  Post-menopausal    Comment: 1st intercourse 51 yo-More than 5 partners  Other Topics Concern   Not on file  Social History Narrative    Lives at home with 1 of her two daughters   Right-handed.   2-4 cups caffeine daily.   Disabled    One story home   Social Determinants of Health   Financial Resource Strain: Low Risk  (11/02/2021)   Overall Financial Resource Strain (CARDIA)    Difficulty of Paying Living Expenses: Not hard at all  Food Insecurity: Food Insecurity Present (11/10/2021)   Hunger Vital Sign    Worried About Running Out of Food in the Last Year: Sometimes true    Ran Out of Food in the Last Year: Sometimes true  Transportation Needs: No Transportation Needs (11/02/2021)   PRAPARE - Hydrologist (Medical): No    Lack of Transportation (Non-Medical): No  Physical Activity: Insufficiently Active (11/02/2021)   Exercise Vital Sign    Days of Exercise per Week: 3 days    Minutes of Exercise per Session: 30 min  Stress: No Stress Concern Present (11/02/2021)   [place]    Feeling of Stress : Not at all  Social Connections: Moderately Isolated (11/02/2021)   Social Connection and Isolation Panel [NHANES]    Frequency of Communication with Friends and Family: More than three times a week    Frequency of Social Gatherings with Friends and Family: More than three times a week    Attends Religious Services: More than 4 times per year    Active Member of [employer] or Organizations: No    Attends Archivist Meetings: Never    Marital Status: Divorced  Human resources officer Violence: Not At Risk (11/02/2021)   Humiliation, Afraid, Rape, and Kick questionnaire    Fear of Current or Ex-Partner: No    Emotionally Abused: No    Physically Abused: No    Sexually Abused: No     PHYSICAL EXAM  Vitals:   12/12/21 1027  BP: 101/63  Pulse: 73  Weight: 213 lb (96.6 kg)  Height: '5\' 2"'$  (1.575 m)    Body mass index is 38.96 kg/m.  Generalized: Well developed, in no acute distress  Cardiology: normal rate and  rhythm, no murmur noted Respiratory: clear to auscultation bilaterally  Neurological examination  Mentation: Alert oriented to time, place, history taking. Follows all commands speech and language fluent Cranial nerve II-XII: Pupils were equal round reactive to light. Extraocular movements were full, visual field were full  Motor: The motor testing reveals 5 over 5 strength of all 4 extremities. Good symmetric motor tone is noted throughout.  Gait and station: Gait is normal.    DIAGNOSTIC DATA (LABS, IMAGING, TESTING) - I reviewed patient records, labs, notes, testing and imaging myself where available.      No data to display           Lab Results  Component Value Date   WBC 9.2 10/30/2021   HGB 12.4 10/30/2021   HCT 37.5 10/30/2021   MCV 90.1 10/30/2021   PLT 235 10/30/2021      Component Value Date/Time   NA 138 11/07/2021 1244   NA 143 03/24/2019 1510   K 4.8 11/07/2021 1244   CL 103 11/07/2021 1244   CO2 22 11/07/2021 1244   GLUCOSE 72 11/07/2021 1244   BUN 11 11/07/2021 1244   BUN 10 03/24/2019 1510   CREATININE  1.00 11/07/2021 1244   CREATININE 1.12 (H) 08/17/2015 1608   CALCIUM 9.3 11/07/2021 1244   PROT 6.5 11/07/2021 1244   PROT 6.9 03/24/2019 1510   ALBUMIN 4.1 11/07/2021 1244   ALBUMIN 4.4 03/24/2019 1510   AST 26 11/07/2021 1244   ALT 32 11/07/2021 1244   ALKPHOS 83 11/07/2021 1244   BILITOT 0.4 11/07/2021 1244   BILITOT <0.2 03/24/2019 1510   GFRNONAA >60 10/30/2021 1743   GFRAA 71 03/24/2019 1510   Lab Results  Component Value Date   CHOL 153 11/07/2021   HDL 52.50 11/07/2021   LDLCALC 73 11/07/2021   TRIG 134.0 11/07/2021   CHOLHDL 3 11/07/2021   Lab Results  Component Value Date   HGBA1C 6.1 11/07/2021   No results found for: "VITAMINB12" Lab Results  Component Value Date   TSH 0.86 09/09/2018     ASSESSMENT AND PLAN 51 y.o. year old female  has a past medical history of Allergy, Anxiety, Bipolar affective psychosis ([place]),  Complication of anesthesia, Depression, Emphysema lung ([place]), Family history of adverse reaction to anesthesia, GERD (gastroesophageal reflux disease), Kidney cysts (01/24/2020), Migraine, Moderate persistent asthma (09/05/2021), Smokers' cough ([place]), SUI (stress urinary incontinence, female), Tubular adenoma of colon (07/08/2020), and Wears glasses. here with     ICD-10-CM   1. OSA (obstructive sleep apnea)  G47.33 For home use only DME continuous positive airway pressure (CPAP)      [name] is doing well on CPAP therapy. Compliance report reveals excellent daily and acceptable 4 hour compliance. She was encouraged to continue using CPAP nightly and for greater than 4 hours each night. We will update supply orders as indicated. Risks of untreated sleep apnea review and education materials provided. She does have daytime sleepiness. She is on multiple mood medicaitons that can have sedating side effects. She was encouraged to focus on sleep hygiene. Continue discussion of sleep walking with psychiatry. Healthy lifestyle habits encouraged. She will follow up in 1 year, sooner if needed. She verbalizes understanding and agreement with this plan.    Orders Placed This Encounter  Procedures   For home use only DME continuous positive airway pressure (CPAP)    Supplies    Order Specific Question:   Length of Need    Answer:   Lifetime    Order Specific Question:   Patient has OSA or probable OSA    Answer:   Yes    Order Specific Question:   Is the patient currently using CPAP in the home    Answer:   Yes    Order Specific Question:   Settings    Answer:   Other see comments    Order Specific Question:   CPAP supplies needed    Answer:   Mask, headgear, cushions, filters, heated tubing and water chamber      No orders of the defined types were placed in this encounter.      Debbora Presto, FNP-C 12/12/2021, 10:50 AM [facility] [address], [place],  [place] [address] [phone]

## 2021-12-12 ENCOUNTER — Ambulatory Visit (INDEPENDENT_AMBULATORY_CARE_PROVIDER_SITE_OTHER): Payer: Medicare Other | Admitting: Family Medicine

## 2021-12-12 ENCOUNTER — Encounter: Payer: Self-pay | Admitting: Family Medicine

## 2021-12-12 VITALS — BP 101/63 | HR 73 | Ht 62.0 in | Wt 213.0 lb

## 2021-12-12 DIAGNOSIS — G4733 Obstructive sleep apnea (adult) (pediatric): Secondary | ICD-10-CM | POA: Diagnosis not present

## 2021-12-13 ENCOUNTER — Telehealth: Payer: Self-pay

## 2021-12-14 ENCOUNTER — Ambulatory Visit (INDEPENDENT_AMBULATORY_CARE_PROVIDER_SITE_OTHER): Payer: Medicare Other | Admitting: Radiology

## 2021-12-14 ENCOUNTER — Ambulatory Visit (INDEPENDENT_AMBULATORY_CARE_PROVIDER_SITE_OTHER): Payer: Medicare Other

## 2021-12-14 VITALS — BP 110/70

## 2021-12-14 DIAGNOSIS — G8929 Other chronic pain: Secondary | ICD-10-CM

## 2021-12-14 DIAGNOSIS — B3731 Acute candidiasis of vulva and vagina: Secondary | ICD-10-CM

## 2021-12-14 DIAGNOSIS — R1031 Right lower quadrant pain: Secondary | ICD-10-CM

## 2021-12-14 MED ORDER — FLUCONAZOLE 150 MG PO TABS: 150.0000 mg | ORAL_TABLET | ORAL | 0 refills | Status: DC

## 2021-12-14 NOTE — Progress Notes (Signed)
   Lauren Chapman [birth year] [id number]   History:  51 y.o. G3P2 presents with c/o RLQ pain since hysterectomy/BSO in 2021.Hyst was done for persistent post menopausal bleeding, benign pathology. Pain is becoming more constant. No contributing factors. Worse with palpation. Described pain as a 'pulling'. No bowel or bladder changes.  Last colonoscopy showed polyps in 2022, repeat 3 years.   Past medical history, past surgical history, family history and social history were all reviewed and documented in the EPIC chart.  ROS:  A ROS was performed and pertinent positives and negatives are included.  Exam:  Vitals:   12/14/21 0904  BP: 110/70   There is no height or weight on file to calculate BMI.  Narrative & Impression Indication: RLQ Pain   Abdominal and transvaginal    Uterus and ovaries absent   Vaginal cuff WNL   No pelvic masses or fluid collections seen    Assessment/Plan:    1. Chronic RLQ pain Reassured no recurrent seroma  2. Vulvovaginal candidiasis Requests refill for recurrent yeast  - fluconazole (DIFLUCAN) 150 MG tablet; Take 1 tablet (150 mg total) by mouth every 3 (three) days.  Dispense: 2 tablet; Refill: 0     Lauren Chapman B WHNP-BC 9:17 AM 12/14/2021

## 2021-12-22 ENCOUNTER — Ambulatory Visit (INDEPENDENT_AMBULATORY_CARE_PROVIDER_SITE_OTHER): Payer: Medicare Other | Admitting: Family Medicine

## 2021-12-22 ENCOUNTER — Ambulatory Visit (INDEPENDENT_AMBULATORY_CARE_PROVIDER_SITE_OTHER)
Admission: RE | Admit: 2021-12-22 | Discharge: 2021-12-22 | Disposition: A | Discharge status: Home / Self Care | Payer: Medicare Other | Source: Ambulatory Visit | Attending: Family Medicine | Admitting: Family Medicine

## 2021-12-22 ENCOUNTER — Encounter: Payer: Self-pay | Admitting: Family Medicine

## 2021-12-22 VITALS — BP 120/78 | HR 76 | Temp 98.8°F | Ht 62.0 in | Wt 216.6 lb

## 2021-12-22 DIAGNOSIS — M25561 Pain in right knee: Secondary | ICD-10-CM

## 2021-12-22 DIAGNOSIS — M25532 Pain in left wrist: Secondary | ICD-10-CM | POA: Diagnosis not present

## 2021-12-22 DIAGNOSIS — S0083XA Contusion of other part of head, initial encounter: Secondary | ICD-10-CM | POA: Diagnosis not present

## 2021-12-22 DIAGNOSIS — R519 Headache, unspecified: Secondary | ICD-10-CM

## 2021-12-22 DIAGNOSIS — W19XXXA Unspecified fall, initial encounter: Secondary | ICD-10-CM

## 2021-12-22 DIAGNOSIS — M25562 Pain in left knee: Secondary | ICD-10-CM | POA: Diagnosis not present

## 2021-12-22 DIAGNOSIS — Y92009 Unspecified place in unspecified non-institutional (private) residence as the place of occurrence of the external cause: Secondary | ICD-10-CM

## 2021-12-22 DIAGNOSIS — S8000XA Contusion of unspecified knee, initial encounter: Secondary | ICD-10-CM

## 2021-12-22 DIAGNOSIS — M79642 Pain in left hand: Secondary | ICD-10-CM | POA: Diagnosis not present

## 2021-12-22 NOTE — Progress Notes (Signed)
Subjective:  Patient ID: Lauren Chapman, female    DOB: February 26, 1970  Age: 51 y.o. MRN: [id number]  CC:  Chief Complaint  Patient presents with   Fall    Pt fell and hurt left wrist, pt fell Tuesday night    Depression    PHQ9 - 5   HPI Lauren Chapman presents for   L wrist pain: Fall 2 nights ago at home. Slipped on dog toy - fell forward, hit chin, face, both knees and left wrist.  Knees bruised, min soreness, but able to WB, no debility. No swelling. No Tx.  Hit chin - no bruising/bleeding or wounds.  No tooth injury, able to open/close jaw without difficulty. No broken teeth.  No LOC, vision changes, no vomiting.  Slight R sided HA 1 hour after fall, about the same. Excedrin a few times - slight improvement.  Main area of pain is left wrist. R hand dominant. Hurts in wrist to forearm. No prior injury. Bayer back and body, tizanidine - min change. More sore.    History Patient Active Problem List   Diagnosis Date Noted   Moderate persistent asthma 09/05/2021   Syncope 08/02/2021   Tobacco abuse 08/02/2021   Vaginal candidiasis 08/02/2021   CAP (community acquired pneumonia) 07/18/2021   OSA (obstructive sleep apnea) 07/18/2021   Hypokalemia 07/18/2021   Depression 03/24/2020   Bilateral low back pain with left-sided sciatica 03/24/2020   Left lumbar radiculopathy 02/25/2020   Absence of bladder continence 02/25/2020   History of postmenopausal bleeding 04/29/2019   Polypharmacy 04/03/2019   Emphysema, unspecified ([place]) 05/17/2018   Weakness 01/25/2017   Chronic migraine w/o aura w/o status migrainosus, not intractable 12/22/2014   Encounter for other general counseling or advice on contraception 09/02/2014   Bipolar disorder ([place]) 10/12/2011   Anxiety and depression 10/12/2011   GERD (gastroesophageal reflux disease) 10/12/2011   Past Medical History:  Diagnosis Date   Allergy    Anxiety    Bipolar affective psychosis ([place])    Complication of anesthesia    "  Acting silly- waving at everyone"-Giddy   Depression    Emphysema lung ([place])    inhaler prn,  followed by pcp and pulmonologist-- dr Margorie John   Family history of adverse reaction to anesthesia    Father had rheumatic fever, and died on operating table.   GERD (gastroesophageal reflux disease)    Kidney cysts 01/24/2020   Right Kidney   Migraine    Moderate persistent asthma 09/05/2021   Smokers' cough ([place])    per pt not productive   SUI (stress urinary incontinence, female)    Tubular adenoma of colon 07/08/2020   Wears glasses    Past Surgical History:  Procedure Laterality Date   ABDOMINAL HYSTERECTOMY N/A    Phreesia 11/30/2019   CARPAL TUNNEL RELEASE Right 09-12-2007   '@[facility]'$    and GANGLION CYST EXCISION   COLONOSCOPY     CYSTOSCOPY N/A 04/29/2019   Procedure: CYSTOSCOPY;  Surgeon: Joseph Pierini, MD;  Location: [facility];  Service: Gynecology;  Laterality: N/A;   DILATATION & CURETTAGE/HYSTEROSCOPY WITH MYOSURE N/A 11/13/2018   Procedure: DILATATION & CURETTAGE/HYSTEROSCOPY WITH MYOSURE;  Surgeon: Anastasio Auerbach, MD;  Location: [place];  Service: Gynecology;  Laterality: N/A;  request 9:00am OR start time in [place] [facility] block requests one hour   DILATION AND CURETTAGE OF UTERUS  02/2019   EYE SURGERY N/A    Phreesia 11/30/2019   GANGLION CYST EXCISION  R wrist   interstem      implant for overactive bladder   KNEE ARTHROSCOPY WITH ANTERIOR CRUCIATE LIGAMENT (ACL) REPAIR Right 10/2015   LAPAROSCOPIC CHOLECYSTECTOMY  1997   LAPAROSCOPIC HYSTERECTOMY Bilateral 04/29/2019   Procedure: HYSTERECTOMY TOTAL LAPAROSCOPIC, BILATERAL SALPINO-OOPHORECTOMY;  Surgeon: Joseph Pierini, MD;  Location: [facility];  Service: Gynecology;  Laterality: Bilateral;   TOOTH EXTRACTION  04/01/2019   tooth removal Bilateral 2022   4 teeth removed   UPPER GASTROINTESTINAL ENDOSCOPY     Allergies  Allergen Reactions   Codeine  Anaphylaxis and Other (See Comments)    Cannot have ANYTHING with codeine   Prior to Admission medications   Medication Sig Start Date End Date Taking? Authorizing Provider  albuterol (VENTOLIN HFA) 108 (90 Base) MCG/ACT inhaler Inhale 2 puffs into the lungs every 4 (four) hours as needed for wheezing or shortness of breath. Patient taking differently: Inhale 2 puffs into the lungs every hour as needed for wheezing or shortness of breath. 02/20/20  Yes Olalere, Adewale A, MD  ARIPiprazole (ABILIFY) 30 MG tablet Take 30 mg by mouth at bedtime. 12/23/20  Yes [provider]  benztropine (COGENTIN) 0.5 MG tablet Take by mouth. 12/07/21  Yes [provider]  budesonide-formoterol (SYMBICORT) 160-4.5 MCG/ACT inhaler Inhale 2 puffs into the lungs in the morning and at bedtime. 09/05/21  Yes Cobb, Karie Schwalbe, NP  busPIRone (BUSPAR) 15 MG tablet Take 15 mg by mouth 3 (three) times daily. 07/30/20  Yes [provider]  citalopram (CELEXA) 20 MG tablet Take 30 mg by mouth daily.   Yes [provider]  EPINEPHrine 0.3 mg/0.3 mL IJ SOAJ injection Inject 0.3 mg into the muscle as needed for anaphylaxis. 03/29/21  Yes Azucena Cecil, PA-C  esomeprazole (NEXIUM) 20 MG capsule Take 1 capsule (20 mg total) by mouth 2 (two) times daily before a meal. 03/21/19  Yes Jacelyn Pi, Lilia Argue, MD  fluconazole (DIFLUCAN) 150 MG tablet Take 1 tablet (150 mg total) by mouth every 3 (three) days. 12/14/21  Yes Chrzanowski, Jami B, NP  gabapentin (NEURONTIN) 800 MG tablet Take 800 mg by mouth 3 (three) times daily.  07/02/18  Yes [provider]  lamoTRIgine (LAMICTAL) 150 MG tablet Take 150 mg by mouth 2 (two) times daily.    Yes [provider]  MYRBETRIQ 50 MG TB24 tablet Take 50 mg by mouth daily. 11/11/21  Yes [provider]  ondansetron (ZOFRAN) 4 MG tablet Take 1 tablet (4 mg total) by mouth every 8 (eight) hours as needed for nausea or vomiting. 01/05/20  Yes  Suzzanne Cloud, NP  SUMAtriptan (IMITREX) 6 MG/0.5ML SOLN injection Inject 6 mg into the skin See admin instructions. INJECT 6 MG (0.5 ml) INTO THE SKIN AS NEEDED FOR MIGRAINE OR HEADACHE. MAY REPEAT ONCE IN 2 HOURS IF HEADACHE PERSISTS OR RECURS. MAX 2 DOSES IN 24 HOURS. 06/11/21  Yes [provider]  tiZANidine (ZANAFLEX) 4 MG tablet Take 1 tablet (4 mg total) by mouth every 6 (six) hours as needed for muscle spasms. 08/15/21  Yes Jaffe, Adam R, DO  MYRBETRIQ 25 MG TB24 tablet Take 25 mg by mouth daily. Patient not taking: Reported on 12/22/2021 07/08/21   [provider]  perphenazine (TRILAFON) 4 MG tablet Take 4 mg by mouth at bedtime. Patient not taking: Reported on 12/22/2021 06/07/21   [provider]   Social History   Socioeconomic History   Marital status: Divorced    Spouse  name: n/a   Number of children: 2   Years of education: 5   Highest education level: Not on file  Occupational History   Occupation: Disabled    Comment: Formerly a Pharmacist, hospital.  Tobacco Use   Smoking status: Former    Packs/day: 1.00    Years: 39.00    Total pack years: 39.00    Types: Cigarettes    Start date: 10/10/1981    Quit date: 07/18/2021    Years since quitting: 0.4    Passive exposure: Never   Smokeless tobacco: Never  Vaping Use   Vaping Use: Never used  Substance and Sexual Activity   Alcohol use: Never    Alcohol/week: 0.0 standard drinks of alcohol   Drug use: Never   Sexual activity: Not Currently    Partners: Male    Birth control/protection: Post-menopausal    Comment: 1st intercourse 51 yo-More than 5 partners  Other Topics Concern   Not on file  Social History Narrative   Lives at home with 1 of her two daughters   Right-handed.   2-4 cups caffeine daily.   Disabled    One story home   Social Determinants of Health   Financial Resource Strain: Low Risk  (11/02/2021)   Overall Financial Resource Strain (CARDIA)    Difficulty of Paying Living  Expenses: Not hard at all  Food Insecurity: Food Insecurity Present (11/10/2021)   Hunger Vital Sign    Worried About Running Out of Food in the Last Year: Sometimes true    Ran Out of Food in the Last Year: Sometimes true  Transportation Needs: No Transportation Needs (11/02/2021)   PRAPARE - Hydrologist (Medical): No    Lack of Transportation (Non-Medical): No  Physical Activity: Insufficiently Active (11/02/2021)   Exercise Vital Sign    Days of Exercise per Week: 3 days    Minutes of Exercise per Session: 30 min  Stress: No Stress Concern Present (11/02/2021)   [place]    Feeling of Stress : Not at all  Social Connections: Moderately Isolated (11/02/2021)   Social Connection and Isolation Panel [NHANES]    Frequency of Communication with Friends and Family: More than three times a week    Frequency of Social Gatherings with Friends and Family: More than three times a week    Attends Religious Services: More than 4 times per year    Active Member of [employer] or Organizations: No    Attends Archivist Meetings: Never    Marital Status: Divorced  Human resources officer Violence: Not At Risk (11/02/2021)   Humiliation, Afraid, Rape, and Kick questionnaire    Fear of Current or Ex-Partner: No    Emotionally Abused: No    Physically Abused: No    Sexually Abused: No   Review of Systems Per HPI.   Objective:   Vitals:   12/22/21 1419  BP: 120/78  Pulse: 76  Temp: 98.8 F (37.1 C)  SpO2: 98%  Weight: 216 lb 9.6 oz (98.2 kg)  Height: '5\' 2"'$  (1.575 m)     Physical Exam Constitutional:      General: She is not in acute distress.    Appearance: Normal appearance. She is well-developed.  HENT:     Head: Normocephalic and atraumatic. No raccoon eyes or Battle's sign.     Jaw: There is normal jaw occlusion. No tenderness, pain on movement or malocclusion.  Comments: No  hemotympanum, scalp and face, including chin nontender, small abrasion tip of chin.  Cardiovascular:     Rate and Rhythm: Normal rate.  Pulmonary:     Effort: Pulmonary effort is normal.  Musculoskeletal:     Comments: Bilateral knees, full range of motion, no ecchymosis, no erythema, no swelling.  No joint line tenderness.  Tender palpation bilateral patella anteriorly without defect or step-off, able to achieve straight leg raise without difficulty or lag. Right wrist/hand pain-free range of motion, no wounds Left wrist, hand, forearm: Guarded exam, discomfort with movement of the wrist.  Diffusely tender over all metacarpals of hand without wounds, erythema or ecchymosis.  Diffusely tender over dorsal wrist, distal ulna.  Neurovascular intact distally with cap refill less than 1 second.        Neurological:     Mental Status: She is alert and oriented to person, place, and time.  Psychiatric:        Mood and Affect: Mood normal.   DG Knee Complete 4 Views Right  Result Date: 12/22/2021 CLINICAL DATA:  Bilateral patellar pain after fall 2 days ago. EXAM: RIGHT KNEE - COMPLETE 4+ VIEW COMPARISON:  Right knee radiographs 08/17/2015 FINDINGS: Interval right ACL reconstruction. Normal alignment. No significant joint space narrowing. No joint effusion. No acute fracture or dislocation. IMPRESSION: Interval right ACL reconstruction. No acute findings. Electronically Signed   By: Yvonne Kendall M.D.   On: 12/22/2021 16:35   DG Knee Complete 4 Views Left  Result Date: 12/22/2021 CLINICAL DATA:  Bilateral patellar pain after fall 2 days ago. EXAM: LEFT KNEE - COMPLETE 4+ VIEW COMPARISON:  Left knee radiographs 07/31/2019 FINDINGS: Normal bone mineralization. Joint spaces are preserved. Mild chronic enthesopathic change at the quadriceps insertion on the patella, unchanged. No joint effusion. No acute fracture or dislocation. IMPRESSION: No acute fracture or dislocation. Electronically Signed    By: Yvonne Kendall M.D.   On: 12/22/2021 16:34   DG Wrist Complete Left  Result Date: 12/22/2021 CLINICAL DATA:  Diffuse left hand pain of metacarpals. Left wrist pain and distal ulnar pain after fall 2 days ago. EXAM: LEFT WRIST - COMPLETE 3+ VIEW COMPARISON:  None Available. FINDINGS: Neutral ulnar variance. Mild triscaphe joint space narrowing and lateral degenerative osteophytes. Normal bone mineralization. No acute fracture is seen. No dislocation. IMPRESSION: Mild triscaphe osteoarthritis. No acute fracture. Electronically Signed   By: Yvonne Kendall M.D.   On: 12/22/2021 16:33   DG Hand Complete Left  Result Date: 12/22/2021 CLINICAL DATA:  Diffuse left hand pain.  Fall 2 days ago. EXAM: LEFT HAND - COMPLETE 3+ VIEW COMPARISON:  None Available. FINDINGS: There is no evidence of fracture or dislocation. There is no evidence of arthropathy or other focal bone abnormality. Soft tissues are unremarkable. IMPRESSION: Negative. Electronically Signed   By: Aletta Edouard M.D.   On: 12/22/2021 16:32     Assessment & Plan:  Lauren Chapman is a 51 y.o. female . Fall in home, initial encounter Mechanical fall as above, individual injury management as below.  Acute nonintractable headache, unspecified headache type Contusion of chin, initial encounter   Acute pain of both knees - Plan: DG Knee Complete 4 Views Left, DG Knee Complete 4 Views Right Patellar contusion, unspecified laterality, initial encounter - Plan: DG Knee Complete 4 Views Left, DG Knee Complete 4 Views Right   Left wrist pain - Plan: DG Wrist Complete Left  Left hand pain - Plan: DG Hand Complete Left  -Fall  at home with injuries as above.   Chin contusion but nontender on exam.  Possible secondary headache versus concussion.  No concerning findings on exam or history, defer neuroimaging at this time head injury handout given, ER precautions given. Intact range of motion, but focal bony tenderness of the patella  bilaterally.  Check imaging.  Likely contusion, unlikely fracture.  Symptomatic care discussed with RTC precautions. Primary area of discomfort is her left wrist, hand.  Guarded exam.  Thumb spica splint placed of hand and wrist and likely Ortho follow-up.    No orders of the defined types were placed in this encounter.  Patient Instructions  Tylenol for headache if needed. If any worsening headache, vomiting or new symptoms be seen in ER.   Xray of knees and wrist, hand as we discussed.  At the [facility] location below.  Ice to sore areas 1--15 min at a time may help.  Tylenol if needed for pain, occasional ibuprofen, only if needed.  Wear wrist brace for now.  Depending on x-rays I will likely refer you to orthopedics.  Hang in there!  Return to the clinic or go to the nearest emergency room if any of your symptoms worsen or new symptoms occur.  [facility] in 8:30-4:30 during weekdays, no appointment needed [place].  [place], [place] [id number]   Head Injury, Adult There are many types of head injuries. Head injuries can be as minor as a small bump, or they can be a serious medical issue. More severe head injuries include: A jarring injury to the brain (concussion). A bruise (contusion) of the brain. This means there is bleeding in the brain that can cause swelling. A cracked skull (skull fracture). Bleeding in the brain that collects, clots, and forms a bump (hematoma). After a head injury, most problems occur within the first 24 hours, but side effects may occur up to 7-10 days after the injury. It is important to watch your condition for any changes. You may need to be observed in the emergency department or urgent care, or you may be admitted to the hospital. What are the causes? There are many possible causes of a head injury. Serious head injuries may be caused by car accidents, bicycle or motorcycle accidents, sports injuries, falls, or being struck by an  object. What are the symptoms? Symptoms of a head injury include a contusion, bump, or bleeding at the site of the injury. Other physical symptoms may include: Headache. Nausea or vomiting. Dizziness. Blurred or double vision. Being uncomfortable around bright lights or loud noises. Seizures. Feeling tired. Trouble being awakened. Loss of consciousness. Mental or emotional symptoms may include: Irritability. Confusion and memory problems. Poor attention and concentration. Changes in eating or sleeping habits. Anxiety or depression. How is this diagnosed? This condition can usually be diagnosed based on your symptoms, a description of the injury, and a physical exam. You may also have imaging tests done, such as a CT scan or an MRI. How is this treated? Treatment for this condition depends on the severity and type of injury you have. The main goal of treatment is to prevent complications and allow the brain time to heal. Mild head injury If you have a mild head injury, you may be sent home, and treatment may include: Observation. A responsible adult should stay with you for 24 hours after your injury and check on you often. Physical rest. Brain rest. Pain medicines. Severe head injury If you have a severe head  injury, treatment may include: Close observation. This includes hospitalization with the following care: Frequent physical exams. Frequent checks of how your brain and nervous system are working (neurological status). Checking your blood pressure and oxygen levels. Medicines to relieve pain, prevent seizures, and decrease brain swelling. Airway protection and breathing support. This may include using a ventilator. Treatments that monitor and manage swelling inside the brain. Brain surgery. This may be needed to: Remove a collection of blood or blood clots. Stop the bleeding. Remove a part of the skull to allow room for the brain to swell. Follow these instructions at  home: Activity Rest and avoid activities that are physically hard or tiring. Make sure you get enough sleep. Let your brain rest by limiting activities that require a lot of thought or attention, such as: Watching TV. Playing memory games and puzzles. Job-related work or homework. Working on [employer], [employer], and texting. Avoid activities that could cause another head injury, such as playing sports, until your health care provider approves. Having another head injury, especially before the first one has healed, can be dangerous. Ask your health care provider when it is safe for you to return to your regular activities, including work or school. Ask your health care provider for a step-by-step plan for gradually returning to activities. Ask your health care provider when you can drive, ride a bicycle, or use heavy machinery. Your ability to react may be slower after a brain injury. Do not do these activities if you are dizzy. Lifestyle  Do not drink alcohol until your health care provider approves. Do not use drugs. Alcohol and certain drugs may slow your recovery and can put you at risk of further injury. If it is harder than usual to remember things, write them down. If you are easily distracted, try to do one thing at a time. Talk with family members or close friends when making important decisions. Tell your friends, family, a trusted colleague, and work Freight forwarder about your injury, symptoms, and restrictions. Have them watch for any new or worsening problems. General instructions Take over-the-counter and prescription medicines only as told by your health care provider. Have someone stay with you for 24 hours after your head injury. This person should watch you for any changes in your symptoms and be ready to seek medical help. Keep all follow-up visits as told by your health care provider. This is important. How is this prevented? Work on improving your balance and strength  to avoid falls. Wear a seat belt when you are in a moving vehicle. Wear a helmet when riding a bicycle, skiing, or doing any other sport or activity that has a risk of injury. If you drink alcohol: Limit how much you use to: 0-1 drink a day for nonpregnant women. 0-2 drinks a day for men. Be aware of how much alcohol is in your drink. In the [place] one drink equals one 12 oz bottle of beer (355 mL), one 5 oz glass of wine (148 mL), or one 1 oz glass of hard liquor (44 mL). Take safety measures in your home, such as: Removing clutter and tripping hazards from floors and stairways. Using grab bars in bathrooms and handrails by stairs. Placing non-slip mats on floors and in bathtubs. Improving lighting in dim areas. Where to find more information Centers for Disease Control and Prevention: http://www.wolf.info/ Get help right away if: You have: A severe headache that is not helped by medicine. Trouble walking or weakness in your arms  and legs. Clear or bloody fluid coming from your nose or ears. Changes in your vision. A seizure. Increased confusion or irritability. Your symptoms get worse. You are sleepier than normal and have trouble staying awake. You lose your balance. Your pupils change size. Your speech is slurred. Your dizziness gets worse. You vomit. These symptoms may represent a serious problem that is an emergency. Do not wait to see if the symptoms will go away. Get medical help right away. Call your local emergency services (911 in the [place] Do not drive yourself to the hospital. Summary Head injuries can be minor, or they can be a serious medical issue requiring immediate attention. Treatment for this condition depends on the severity and type of injury you have. Have someone stay with you for 24 hours after your injury and check on you often. Ask your health care provider when it is safe for you to return to your regular activities, including work or school. Head injury  prevention includes wearing a seat belt in a motor vehicle, using a helmet on a bicycle, limiting alcohol use, and taking safety measures in your home. This information is not intended to replace advice given to you by your health care provider. Make sure you discuss any questions you have with your health care provider. Document Revised: 11/22/2018 Document Reviewed: 11/22/2018 Elsevier Patient Education  2023 [place],   Merri Ray, MD [place], [facility] 12/22/21 3:08 PM

## 2021-12-22 NOTE — Patient Instructions (Addendum)
Tylenol for headache if needed. If any worsening headache, vomiting or new symptoms be seen in ER.   Xray of knees and wrist, hand as we discussed.  At the [facility] location below.  Ice to sore areas 1--15 min at a time may help.  Tylenol if needed for pain, occasional ibuprofen, only if needed.  Wear wrist brace for now.  Depending on x-rays I will likely refer you to orthopedics.  Hang in there!  Return to the clinic or go to the nearest emergency room if any of your symptoms worsen or new symptoms occur.   [facility] in 8:30-4:30 during weekdays, no appointment needed [place].  [place], [place] [address]   Head Injury, Adult There are many types of head injuries. Head injuries can be as minor as a small bump, or they can be a serious medical issue. More severe head injuries include: A jarring injury to the brain (concussion). A bruise (contusion) of the brain. This means there is bleeding in the brain that can cause swelling. A cracked skull (skull fracture). Bleeding in the brain that collects, clots, and forms a bump (hematoma). After a head injury, most problems occur within the first 24 hours, but side effects may occur up to 7-10 days after the injury. It is important to watch your condition for any changes. You may need to be observed in the emergency department or urgent care, or you may be admitted to the hospital. What are the causes? There are many possible causes of a head injury. Serious head injuries may be caused by car accidents, bicycle or motorcycle accidents, sports injuries, falls, or being struck by an object. What are the symptoms? Symptoms of a head injury include a contusion, bump, or bleeding at the site of the injury. Other physical symptoms may include: Headache. Nausea or vomiting. Dizziness. Blurred or double vision. Being uncomfortable around bright lights or loud noises. Seizures. Feeling tired. Trouble being awakened. Loss of  consciousness. Mental or emotional symptoms may include: Irritability. Confusion and memory problems. Poor attention and concentration. Changes in eating or sleeping habits. Anxiety or depression. How is this diagnosed? This condition can usually be diagnosed based on your symptoms, a description of the injury, and a physical exam. You may also have imaging tests done, such as a CT scan or an MRI. How is this treated? Treatment for this condition depends on the severity and type of injury you have. The main goal of treatment is to prevent complications and allow the brain time to heal. Mild head injury If you have a mild head injury, you may be sent home, and treatment may include: Observation. A responsible adult should stay with you for 24 hours after your injury and check on you often. Physical rest. Brain rest. Pain medicines. Severe head injury If you have a severe head injury, treatment may include: Close observation. This includes hospitalization with the following care: Frequent physical exams. Frequent checks of how your brain and nervous system are working (neurological status). Checking your blood pressure and oxygen levels. Medicines to relieve pain, prevent seizures, and decrease brain swelling. Airway protection and breathing support. This may include using a ventilator. Treatments that monitor and manage swelling inside the brain. Brain surgery. This may be needed to: Remove a collection of blood or blood clots. Stop the bleeding. Remove a part of the skull to allow room for the brain to swell. Follow these instructions at home: Activity Rest and avoid activities that are physically  hard or tiring. Make sure you get enough sleep. Let your brain rest by limiting activities that require a lot of thought or attention, such as: Watching TV. Playing memory games and puzzles. Job-related work or homework. Working on [employer], and texting. Avoid  activities that could cause another head injury, such as playing sports, until your health care provider approves. Having another head injury, especially before the first one has healed, can be dangerous. Ask your health care provider when it is safe for you to return to your regular activities, including work or school. Ask your health care provider for a step-by-step plan for gradually returning to activities. Ask your health care provider when you can drive, ride a bicycle, or use heavy machinery. Your ability to react may be slower after a brain injury. Do not do these activities if you are dizzy. Lifestyle  Do not drink alcohol until your health care provider approves. Do not use drugs. Alcohol and certain drugs may slow your recovery and can put you at risk of further injury. If it is harder than usual to remember things, write them down. If you are easily distracted, try to do one thing at a time. Talk with family members or close friends when making important decisions. Tell your friends, family, a trusted colleague, and work Fre[employer] about your injury, symptoms, and restrictions. Have them watch for any new or worsening problems. General instructions Take over-the-counter and prescription medicines only as told by your health care provider. Have someone stay with you for 24 hours after your head injury. This person should watch you for any changes in your symptoms and be ready to seek medical help. Keep all follow-up visits as told by your health care provider. This is important. How is this prevented? Work on improving your balance and strength to avoid falls. Wear a seat belt when you are in a moving vehicle. Wear a helmet when riding a bicycle, skiing, or doing any other sport or activity that has a risk of injury. If you drink alcohol: Limit how much you use to: 0-1 drink a day for nonpregnant women. 0-2 drinks a day for men. Be aware of how much alcohol is in your drink. In the  [place] one drink equals one 12 oz bottle of beer (355 mL), one 5 oz glass of wine (148 mL), or one 1 oz glass of hard liquor (44 mL). Take safety measures in your home, such as: Removing clutter and tripping hazards from floors and stairways. Using grab bars in bathrooms and handrails by stairs. Placing non-slip mats on floors and in bathtubs. Improving lighting in dim areas. Where to find more information Centers for Disease Control and Prevention: http://www.wolf.info/ Get help right away if: You have: A severe headache that is not helped by medicine. Trouble walking or weakness in your arms and legs. Clear or bloody fluid coming from your nose or ears. Changes in your vision. A seizure. Increased confusion or irritability. Your symptoms get worse. You are sleepier than normal and have trouble staying awake. You lose your balance. Your pupils change size. Your speech is slurred. Your dizziness gets worse. You vomit. These symptoms may represent a serious problem that is an emergency. Do not wait to see if the symptoms will go away. Get medical help right away. Call your local emergency services (911 in the [place] Do not drive yourself to the hospital. Summary Head injuries can be minor, or they can be a serious medical issue  requiring immediate attention. Treatment for this condition depends on the severity and type of injury you have. Have someone stay with you for 24 hours after your injury and check on you often. Ask your health care provider when it is safe for you to return to your regular activities, including work or school. Head injury prevention includes wearing a seat belt in a motor vehicle, using a helmet on a bicycle, limiting alcohol use, and taking safety measures in your home. This information is not intended to replace advice given to you by your health care provider. Make sure you discuss any questions you have with your health care provider. Document Revised: 11/22/2018  Document Reviewed: 11/22/2018 Elsevier Patient Education  [place].

## 2021-12-23 ENCOUNTER — Ambulatory Visit (INDEPENDENT_AMBULATORY_CARE_PROVIDER_SITE_OTHER): Payer: Medicare Other

## 2021-12-23 VITALS — BP 124/76 | HR 68 | Temp 97.6°F | Resp 18 | Ht 62.0 in | Wt 214.8 lb

## 2021-12-23 DIAGNOSIS — G43709 Chronic migraine without aura, not intractable, without status migrainosus: Secondary | ICD-10-CM

## 2021-12-23 MED ORDER — SODIUM CHLORIDE 0.9 % IV SOLN
100.0000 mg | Freq: Once | INTRAVENOUS | Status: AC
  Administered 2021-12-23: 100 mg via INTRAVENOUS
  Filled 2021-12-23: qty 1

## 2021-12-23 NOTE — Progress Notes (Signed)
Diagnosis: Migraine  Provider:  Marshell Garfinkel MD  Procedure: Infusion  IV Type: Peripheral, IV Location: R Hand  Vyepti (Eptinezumab-jjmr), Dose: 100 mg  Infusion Start Time: 0924  Infusion Stop Time: 0957  Post Infusion IV Care: Peripheral IV Discontinued  Discharge: Condition: Good, Destination: Home . AVS provided to patient.   Performed by:  Cleophus Molt, RN

## 2021-12-27 ENCOUNTER — Encounter: Payer: Self-pay | Admitting: Radiology

## 2021-12-27 ENCOUNTER — Ambulatory Visit (INDEPENDENT_AMBULATORY_CARE_PROVIDER_SITE_OTHER): Payer: Medicare Other | Admitting: Radiology

## 2021-12-27 VITALS — BP 124/80 | Ht 62.75 in | Wt 214.0 lb

## 2021-12-27 DIAGNOSIS — Z9189 Other specified personal risk factors, not elsewhere classified: Secondary | ICD-10-CM | POA: Diagnosis not present

## 2021-12-27 DIAGNOSIS — Z01419 Encounter for gynecological examination (general) (routine) without abnormal findings: Secondary | ICD-10-CM

## 2021-12-27 NOTE — Progress Notes (Signed)
   Lauren Chapman [birth year] [id number]   History:  51 y.o. G3P2 presents for annual exam. No new gyn concerns  Gynecologic History Hysterectomy: 2021  Sexually active: yes  Health Maintenance Last Pap: 2019. Results were: normal Last mammogram: 01/2021. Results were: normal Last colonoscopy: 07/08/20. Results were: polyps, repeat 3 years   Past medical history, past surgical history, family history and social history were all reviewed and documented in the EPIC chart.  ROS:  A ROS was performed and pertinent positives and negatives are included.  Exam:  Vitals:   12/27/21 0856  BP: 124/80  Weight: 214 lb (97.1 kg)  Height: 5' 2.75" (1.594 m)   Body mass index is 38.21 kg/m.  General appearance:  Normal Thyroid:  Symmetrical, normal in size, without palpable masses or nodularity. Respiratory  Auscultation:  Clear without wheezing or rhonchi Cardiovascular  Auscultation:  Regular rate, without rubs, murmurs or gallops  Edema/varicosities:  Not grossly evident Abdominal  Soft,nontender, without masses, guarding or rebound.  Liver/spleen:  No organomegaly noted  Hernia:  None appreciated  Skin  Inspection:  Grossly normal Breasts: Examined lying and sitting.   Right: Without masses, retractions, nipple discharge or axillary adenopathy.   Left: Without masses, retractions, nipple discharge or axillary adenopathy. Genitourinary   Inguinal/mons:  Normal without inguinal adenopathy  External genitalia:  Normal appearing vulva with no masses, tenderness, or lesions  BUS/Urethra/Skene's glands:  Normal  Vagina:  Normal appearing with normal color and discharge, no lesions. Atrophy mild  Cervix:  absent  Uterus:  absent  Adnexa/parametria:  absent  Anus and perineum: Normal  Digital rectal exam: deferred  Patient informed chaperone available to be present for breast and pelvic exam. Patient has requested no chaperone to be present. Patient has been advised what will be  completed during breast and pelvic exam.   Assessment/Plan:   1. Well woman exam with routine gynecological exam Pap 2024    Discussed SBE, colonoscopy and DEXA screening as appropriate. Encouraged 183mns/week of cardiovascular and weight bearing exercise minimum. Recommend the use of seatbelts and sunscreen consistently.   Return in 1 year for annual or sooner prn.  CRubbie BattiestB WHNP-BC 9:22 AM 12/27/2021

## 2022-01-11 ENCOUNTER — Encounter: Payer: Medicare Other | Attending: Family Medicine | Admitting: Dietician

## 2022-01-11 VITALS — Ht 63.0 in | Wt 215.8 lb

## 2022-01-11 DIAGNOSIS — E669 Obesity, unspecified: Secondary | ICD-10-CM | POA: Diagnosis present

## 2022-01-11 DIAGNOSIS — Z713 Dietary counseling and surveillance: Secondary | ICD-10-CM | POA: Insufficient documentation

## 2022-01-11 DIAGNOSIS — Z87891 Personal history of nicotine dependence: Secondary | ICD-10-CM | POA: Insufficient documentation

## 2022-01-11 DIAGNOSIS — Z6838 Body mass index (BMI) 38.0-38.9, adult: Secondary | ICD-10-CM | POA: Insufficient documentation

## 2022-01-11 NOTE — Progress Notes (Signed)
Medical Nutrition Therapy  Appointment Start time:  1000 Appointment End time:  1035   Primary concerns today: Pt states her weight has been up and down since she had her daughter 20+ years ago. Quit smoking in June and weight has crept up and pt wants to get back to feeling healthy.    Referral diagnosis: E66.9 Preferred learning style: no preference indicated Learning readiness: ready     NUTRITION ASSESSMENT    Anthropometrics  Ht: 63in Wt 11/10/21: 209.3 lbs Wt 01/11/22: 215.8 lbs   Clinical Medical Hx: allergies, anxiety, asthma, COPD, depression, GERD, prediabetes Medications: reviewed Labs: 11/07/21: A1C 6.1% Notable Signs/Symptoms: stomach pain, acid reflux. Food Allergies: none   Lifestyle & Dietary Hx   Pt states she has been trying to eat smaller portions and balancing her plate. Pt states she has not been able to walk outside of work because she has been tired.  Pt drinks 1 soda a week and only eats a candy bar every few days. Since last time we met she started drinking soda again but then started limiting it to 1x/wk.  Pt has been trying to pack her lunch.  When pt works she still does not eat breakfast.  Pt states she slept for 24 hours straight over the weekend.    Estimated daily fluid intake: 64 oz Supplements: MVI Sleep: 10-12 hours, feels very tired, works late and sleeps in.  Stress / self-care: moderate stress Current average weekly physical activity: ADLs, [phone] steps at work   24-Hr Dietary Recall First Meal: skips if working at [employer]: none Second Meal: 1-2pm: special K cereal Snack: none Third Meal: IF OFF: parmesan chicken with asparagus and mashed potatoes OR spaghetti with meat sauce and pasta OR IF WORKING: ham and cheese sandwich, apples and peanut butter and yogurt.  Snack: none Beverages: 5 infused water bottles, unsweet tea, coffee with flavored cream, ginger ale    NUTRITION DIAGNOSIS  NB-1.1 Food and nutrition-related  knowledge deficit As related to prior nutrition education.  As evidenced by pt report and diet history.     NUTRITION INTERVENTION  Nutrition education (E-1) on the following topics:  Importance of physical activity and physical activity goals Consistent meal times and avoiding skipping meals Whole grains vs refined grains MyPlate   Handouts Provided Include  Dish Up a Healthy Meal   Learning Style & Readiness for Change Teaching method utilized: Visual & Auditory  Demonstrated degree of understanding via: Teach Back  Barriers to learning/adherence to lifestyle change: none   Goals Established by Pt Aim for 150 minutes of physical activity weekly Aim to eat within 1-2 hours of waking up, and every 3-5 hours following.  Balanced snack for work: choose at least a carb and protein!  Limit foods that worsen GERD symptoms (see list in summary)  New Goals: Goal: Walk for 30 minutes 2x/wk.  Goal: Limit to 1 small can soda per week.    MONITORING & EVALUATION Dietary intake, weekly physical activity, and follow up in 3 months.   Next Steps  Patient is to call for questions.

## 2022-01-11 NOTE — Patient Instructions (Addendum)
Goal: Walk for 30 minutes 2x/wk.   Goal: Limit to 1 small can soda per week.

## 2022-01-25 ENCOUNTER — Ambulatory Visit (INDEPENDENT_AMBULATORY_CARE_PROVIDER_SITE_OTHER): Payer: Medicare Other

## 2022-01-25 ENCOUNTER — Ambulatory Visit (INDEPENDENT_AMBULATORY_CARE_PROVIDER_SITE_OTHER): Payer: Medicare Other | Admitting: Podiatry

## 2022-01-25 ENCOUNTER — Encounter: Payer: Self-pay | Admitting: Podiatry

## 2022-01-25 VITALS — BP 97/53

## 2022-01-25 DIAGNOSIS — M205X1 Other deformities of toe(s) (acquired), right foot: Secondary | ICD-10-CM

## 2022-01-25 DIAGNOSIS — L600 Ingrowing nail: Secondary | ICD-10-CM

## 2022-01-25 NOTE — Progress Notes (Signed)
Subjective:   Patient ID: Lauren Chapman, female   DOB: 52 y.o.   MRN: [id number]   HPI Patient presents stating she has had a significant increase in pain in her big toe joint right and it only was better for short period of time with the injection last time.  States the joint is getting more find out and harder for her to be active   ROS      Objective:  Physical Exam  Neurovascular status intact with moderate limitation of motion first MPJ right with inflammation fluid buildup around the joint pain with palpation     Assessment:  Chronic hallux limitus deformity right with a lot of pain secondary to structural changes and spur formation     Plan:  H&P reviewed at great length and I do think that given the lack of response now to medication shoe gear modification that surgical intervention would probably be best.  I have recommended a biplanar osteotomy first metatarsal right I discussed the procedure and risk patient wants surgery understanding risk and I allowed her to read then signed consent form going over alternative treatments complications.  She understands no guarantee this will solve the problem but due to the intense discomfort she wants to pursue this and understands ultimately could require a fusion or joint implant procedure.  I did dispense air fracture walker today all instructions on usage were given it was properly placed on her lower leg and I would like her to wear it prior to surgery to get used to it  X-rays indicate spurring of the first metatarsal head right moderate narrowness of the joint surface with elevation of the first metatarsal segment

## 2022-01-26 ENCOUNTER — Telehealth: Payer: Self-pay | Admitting: Podiatry

## 2022-01-26 NOTE — Telephone Encounter (Signed)
DOS: 02/14/2022  [employer] Medicare Effective 01/23/2022 [place] [facility] Line Access  Deductible: $240 with $0 met Out-of-Pocket: [phone] with $0 met CoInsurance: 20%  Notification or Prior Authorization is not required for the requested services  You are not required to submit a notification/prior authorization based on the information provided. The number above acknowledges your inquiry and our response. Please write this number down and refer to it for future inquiries. If you still wish to submit your request for review, please select the Continue with Submission button below.  Decision ID #:[id number]  The number above acknowledges your inquiry and our response. Please write this number down and refer to it for future inquiries. Coverage and payment for an item or service is governed by the member's benefit plan document, and, if applicable, the provider's participation agreement with the Health Plan.

## 2022-01-30 IMAGING — CT CT ABD-PELV W/ CM
2 of 5 series · 16 of 46 positions shown, 18 images · IV contrast (omnipaque)
Comparison: None.

CLINICAL DATA: Hematuria unknown cause.

EXAM:
CT ABDOMEN AND PELVIS WITH CONTRAST
TECHNIQUE: Multidetector CT imaging of the abdomen and pelvis was performed
using the standard protocol following bolus administration of
intravenous contrast.
CONTRAST:  100mL OMNIPAQUE IOHEXOL 300 MG/ML  SOLN

[Series 2: axial st · axial · 0.75mm/px · z∈[+1058,+1438]mm · 13 of 90 slices shown, 15 images]
[im 7/90  soft-tissue]
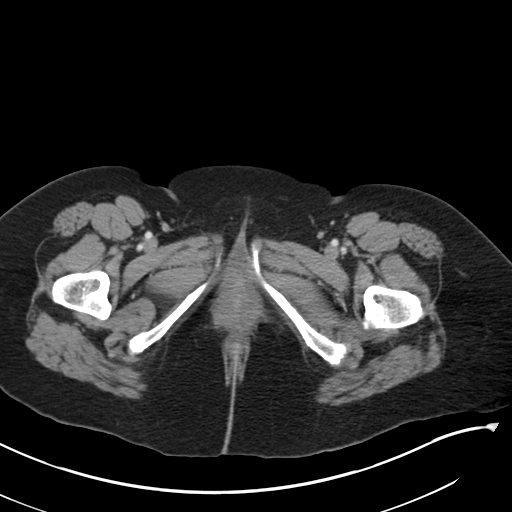
[im 7/90  bone]
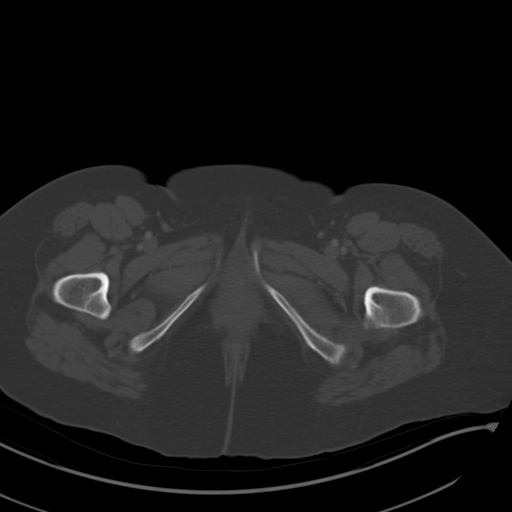
[im 13/90  soft-tissue]
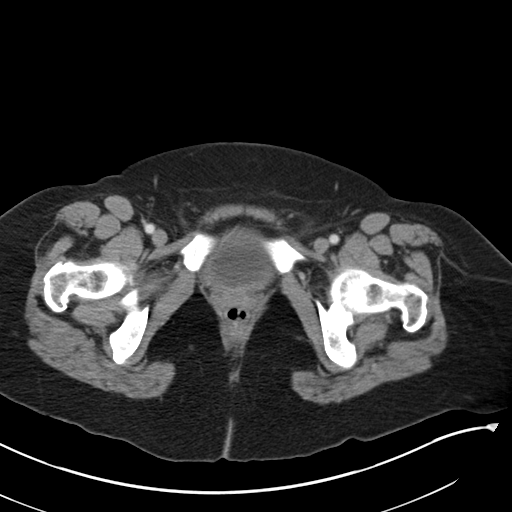
[im 20/90  soft-tissue]
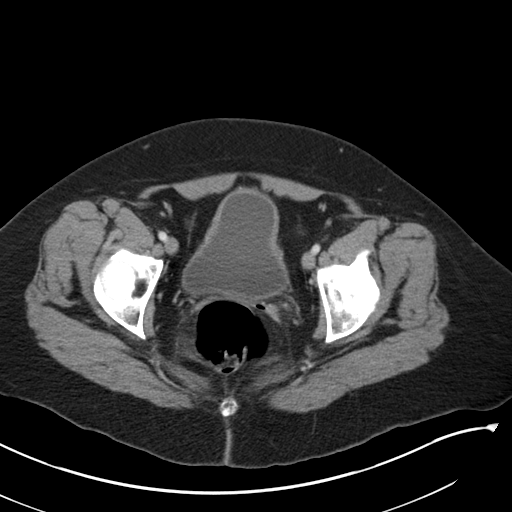
[im 26/90  soft-tissue]
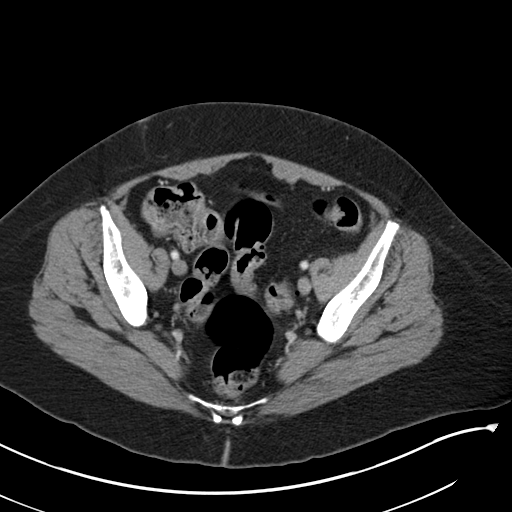
[im 32/90  soft-tissue]
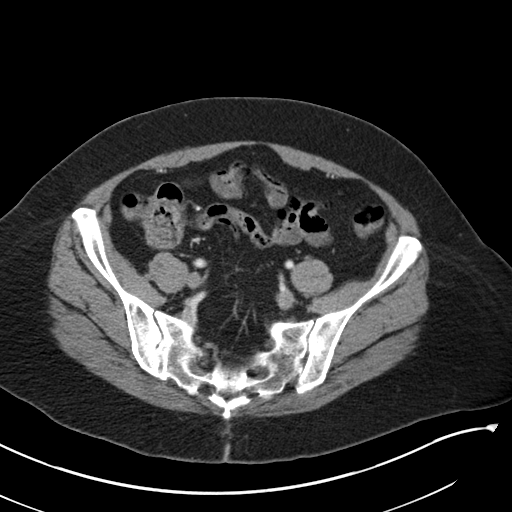
[im 39/90  soft-tissue]
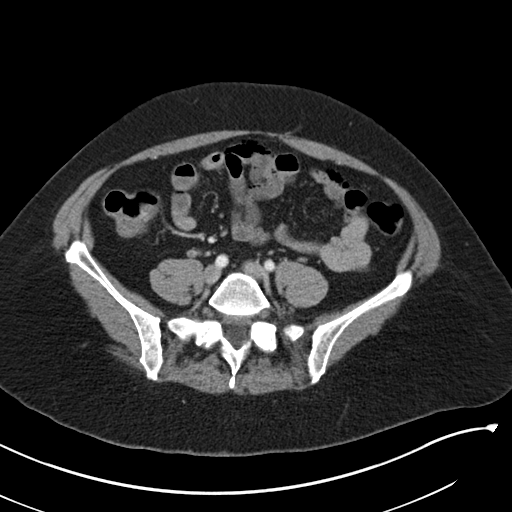
[im 45/90  soft-tissue]
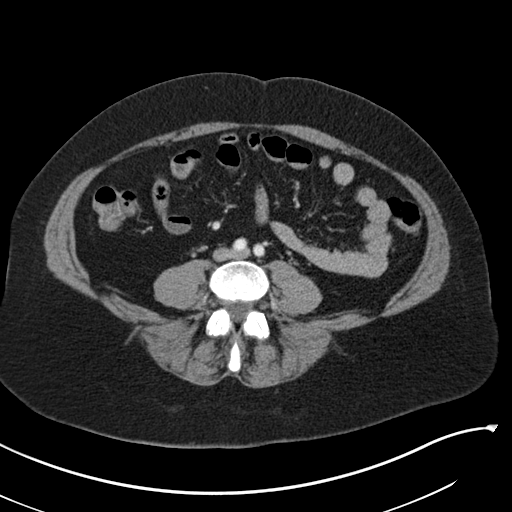
[im 51/90  soft-tissue]
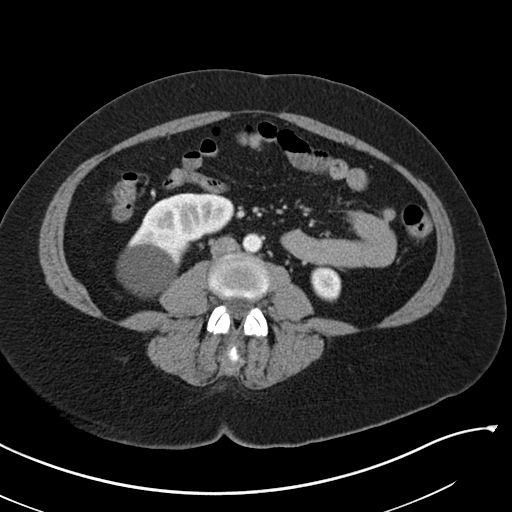
[im 58/90  soft-tissue]
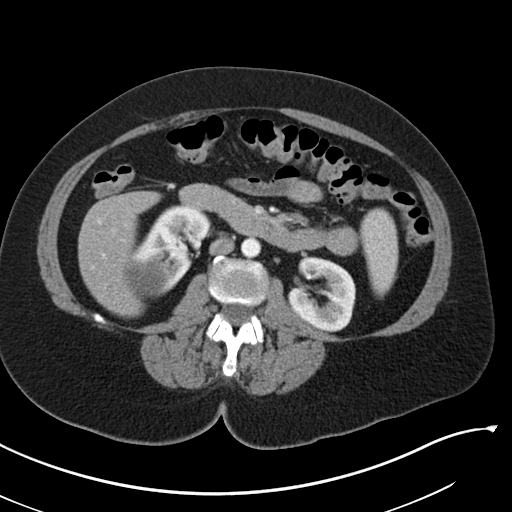
[im 58/90  bone]
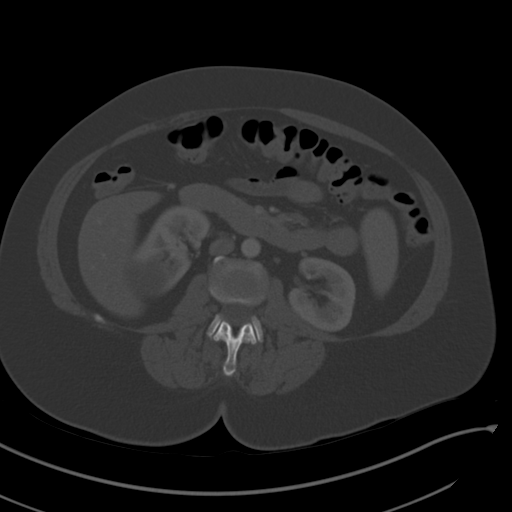
[im 64/90  soft-tissue]
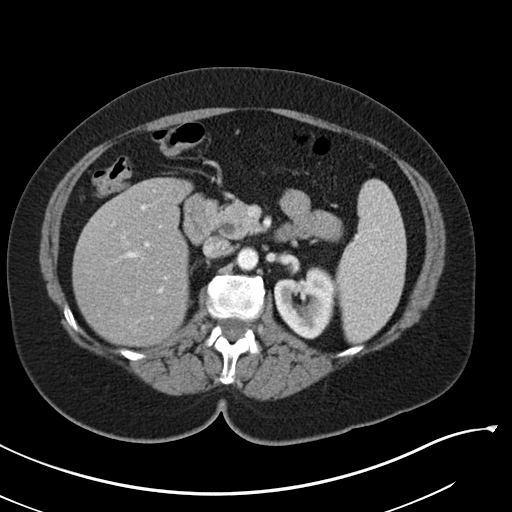
[im 70/90  soft-tissue]
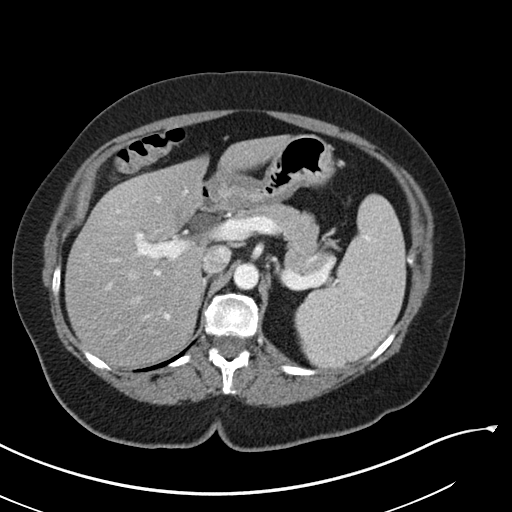
[im 77/90  soft-tissue]
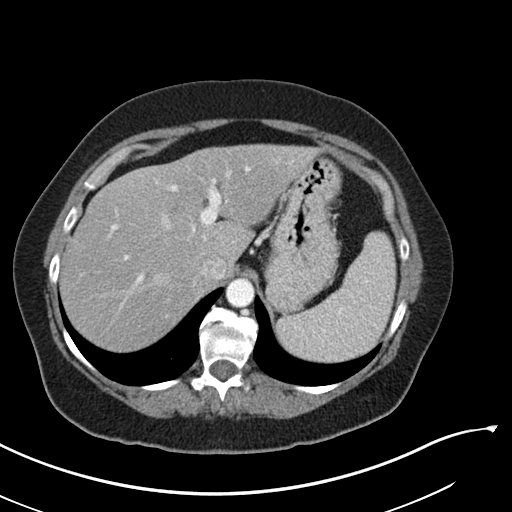
[im 83/90  soft-tissue]
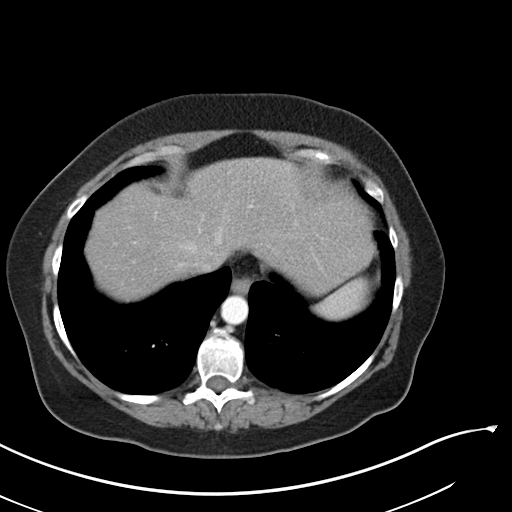

[Series 5: coronal st · coronal · 0.85mm/px · 3 of 151 slices shown]
[im 51/151  soft-tissue]
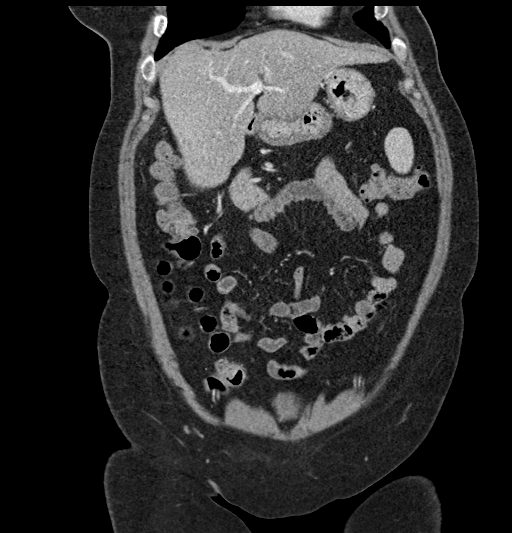
[im 67/151  soft-tissue]
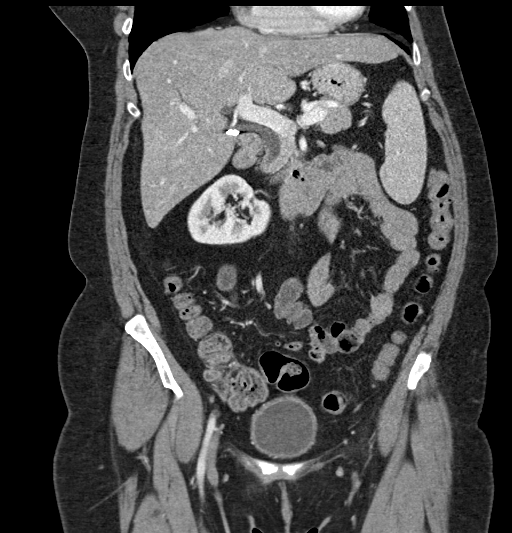
[im 84/151  soft-tissue]
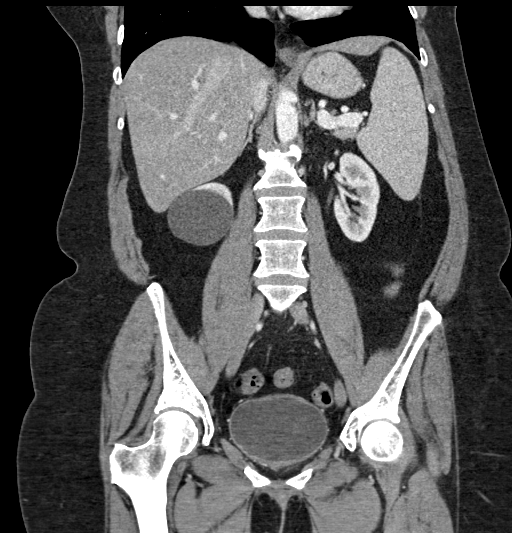

[16 of 46 positions shown; findings below may reference images not displayed]

FINDINGS: Lower chest: The lung bases are clear. The heart size is normal.

Hepatobiliary: There is probable underlying hepatic steatosis.
Status post cholecystectomy.There is no biliary ductal dilation.

Pancreas: Normal contours without ductal dilatation. No
peripancreatic fluid collection.

Spleen: There is significant splenomegaly with the spleen measuring
up to approximately 14 cm craniocaudad.

Adrenals/Urinary Tract:

--Adrenal glands: Normal

--Right kidney/ureter: There is a simple appearing cyst arising from
the right kidney.

--Left kidney/ureter: No hydronephrosis or radiopaque kidney stones.

--Urinary bladder: There is mild bladder wall thickening.

Stomach/Bowel:

--Stomach/Duodenum: No hiatal hernia or other gastric abnormality.
Normal duodenal course and caliber.

--Small bowel: Unremarkable.

--Colon: There is mild focal wall thickening of the transverse colon
(coronal series 5, image 33). This appears to improve on the delayed
imaging and is felt to be secondary to peristalsis or
underdistention.

--Appendix: Normal.

Vascular/Lymphatic: Normal course and caliber of the major abdominal
vessels.

--No retroperitoneal lymphadenopathy.

--No mesenteric lymphadenopathy.

--No pelvic or inguinal lymphadenopathy.

Reproductive: Status post hysterectomy. No adnexal mass.

Other: No ascites or free air. The abdominal wall is normal.

Musculoskeletal. No acute displaced fractures.
IMPRESSION: 1. Mild bladder wall thickening. Correlation with urinalysis is
recommended.
2. No radiopaque kidney stones. No hydronephrosis.
3. Splenomegaly.
4. Probable underlying hepatic steatosis.

## 2022-02-08 ENCOUNTER — Other Ambulatory Visit: Payer: Self-pay | Admitting: Podiatry

## 2022-02-08 DIAGNOSIS — M205X1 Other deformities of toe(s) (acquired), right foot: Secondary | ICD-10-CM

## 2022-02-08 DIAGNOSIS — L600 Ingrowing nail: Secondary | ICD-10-CM

## 2022-02-10 ENCOUNTER — Encounter: Payer: Self-pay | Admitting: Neurology

## 2022-02-13 ENCOUNTER — Encounter: Payer: Self-pay | Admitting: Neurology

## 2022-02-13 MED ORDER — OXYCODONE-ACETAMINOPHEN 10-325 MG PO TABS: 1.0000 | ORAL_TABLET | ORAL | 0 refills | Status: DC | PRN

## 2022-02-13 NOTE — Addendum Note (Signed)
Addended by: Wallene Huh on: 02/13/2022 01:53 PM   Modules accepted: Orders

## 2022-02-14 ENCOUNTER — Encounter: Payer: Self-pay | Admitting: Podiatry

## 2022-02-14 DIAGNOSIS — M2021 Hallux rigidus, right foot: Secondary | ICD-10-CM | POA: Diagnosis not present

## 2022-02-14 DIAGNOSIS — L600 Ingrowing nail: Secondary | ICD-10-CM | POA: Diagnosis not present

## 2022-02-15 NOTE — Progress Notes (Signed)
NEUROLOGY FOLLOW UP OFFICE NOTE  Lauren Chapman [id number]  Assessment/Plan:     Migraine with aura, without status migrainosus, not intractable    Migraine prevention:  Increase Vyepti to '300mg'$  every 3 months Migraine rescue:  sumatriptan '60mg'$  [place].  Will provide samples of Nurtec to try as well.  May take with Zofran and tizanidine.   Magnesium citrate '400mg'$  qd, riboflavin '400mg'$  qd, CoQ-10 '100mg'$  TID Discussed diet/caffeine cessation/exercise Continue use of CPAP.  Followed by sleep clinic at [facility] Keep headache diary Follow up 7 months.       Subjective:  Lauren Chapman is a 52 year old right-handed female with emphysema, Bipolar disorder, depression, anxiety, and history of tubular adenoma of the colon who follows up for migraines.    UPDATE: Intensity:  3-4/10 Duration:  goes to bed for a 3-4 hours and wakes up without headache with sumatriptan '6mg'$  [place].  By the third month, sumatriptan doesn't work  Frequency:  1 a week for first month, 2-3 a week for second month, almost daily the third month. Frequency of abortive medication: 6 days a month Current NSAIDS/analgesics:  ASA Current triptans:  Sumatriptan '6mg'$  [place] Current ergotamine:  none Current anti-emetic:  Zofran '4mg'$  Current muscle relaxants:  tizanidine '4mg'$  Current Antihypertensive medications:  none Current Antidepressant medications:  citalopram '20mg'$  daily Current Anticonvulsant medications:  gabapentin '800mg'$  three times daily, lamotrigine '150mg'$  BID Current anti-CGRP:  Vyepti '100mg'$  every 3 months Current Vitamins/Herbal/Supplements:  magnesium citrate '400mg'$  daily, riboflavin '400mg'$  daily, CoQ-10 '100mg'$  TID.   Current Antihistamines/Decongestants:  none Other therapy:  none Hormone/birth control:  none Other medications:  Abilify, buspirone, benztropine   Caffeine:  3 pods a day of decaf coffee.  No longer drinks cola Smoking:  quit Diet:  Drinks Bubbly but no water.  Sometimes Sprite.  May skip meals when working  closing shift ([employer]) twice a week.  Eats junk food. Eats meat/chicken with starch vegetables/potatoes Exercise:  no Depression:  yes; Anxiety:  yes Other pain:  Chronic back pain, lumbar radicular pain.  Sleep hygiene:  Diagnosed with OSA in June.  On CPAP but still without rested sleep.  Sleepwalks.     HISTORY:  Onset:  52 years old Location:  left sided (frontal/retro-orbital) Quality:  pounding Intensity:  9-10/10.   Aura:  sometimes headaches preceded by visual aura (fuzzy vision/floaters in left eye) and phantosmia (metallic smell) Prodrome:  absent Associated symptoms:  Nausea, vomiting, photophobia, phonophobia, dizziness (once every 2 months).  She denies associated unilateral numbness or weakness. Duration:  1-2 days, sometimes 4-5 days (years ago would last 1-2 weeks) Frequency:  4 days a week Frequency of abortive medication: 5 days a week switches between sumatriptan and Excedrin Migraine Triggers/aggravating factors:  not eating, stress, worse when laying down Relieving factors:  nothing Activity:  aggravates   History of medication overuse leading to daily headaches in 2021. MRI of brain without contrast on 03/16/2020 personally reviewed revealed minimal nonspecific scattered hyperintense foci within the bilateral periventricular and subcortical white matter, stable compared to prior MRI from 02/06/2017.   History of chronic low back pain.  In 2021 reported worsening left lower back pain with radicular pain down the left leg causing left leg weakness.  Also endorsed bowel and bladder incontinence.  MRI of lumbar spine without contrast on 03/09/2020 personally reviewed showed no spinal or foraminal stenosis.  MRI of cervical spine on 03/09/2020 personally reviewed showed mild spondylosis and disc bulging from C3-4 to C6-7 with mild left  foraminal stenosis at C4-5 but otherwise no spinal canal stenosis or foraminal stenosis.  MRI of thoracic spine on 03/16/2020 personally reviewed  was unremarkable.         Past NSAIDS/analgesics:  ibuprofen, naproxen, BC powder, diclofenac '75mg'$ , meloxicam, Excedrin Past abortive triptans:  rizatriptan '10mg'$ , sumatriptan '20mg'$  NS/tab Past abortive ergotamine:  none Past muscle relaxants:  methocarbamol, cyclobenzaprine Past anti-emetic:  none Past antihypertensive medications:  propranolol Past antidepressant medications:  amitriptyline, imipramine, sertraline, mirtazapine, Wellbutrin, vortioxetine, vilazodone Past anticonvulsant medications:  none Past anti-CGRP:  Aimovig, Ajovy, Emgality Past vitamins/Herbal/Supplements:  none Past antihistamines/decongestants:  Flonase Other past therapies:  Botox     Family history of headache:  daughters (migraines)  PAST MEDICAL HISTORY: Past Medical History:  Diagnosis Date   Allergy    Anxiety    Bipolar affective psychosis ([place])    Complication of anesthesia    " Acting silly- waving at everyone"-Giddy   Depression    Emphysema lung ([place])    inhaler prn,  followed by pcp and pulmonologist-- dr Margorie John   Family history of adverse reaction to anesthesia    Father had rheumatic fever, and died on operating table.   GERD (gastroesophageal reflux disease)    Kidney cysts 01/24/2020   Right Kidney   Migraine    Moderate persistent asthma 09/05/2021   Smokers' cough ([place])    per pt not productive   SUI (stress urinary incontinence, female)    Tubular adenoma of colon 07/08/2020   Wears glasses     MEDICATIONS: Current Outpatient Medications on File Prior to Visit  Medication Sig Dispense Refill   albuterol (VENTOLIN HFA) 108 (90 Base) MCG/ACT inhaler Inhale 2 puffs into the lungs every 4 (four) hours as needed for wheezing or shortness of breath. (Patient taking differently: Inhale 2 puffs into the lungs every hour as needed for wheezing or shortness of breath.) 1 each 1   ARIPiprazole (ABILIFY) 30 MG tablet Take 30 mg by mouth at bedtime.     benztropine (COGENTIN) 0.5 MG  tablet Take by mouth.     budesonide-formoterol (SYMBICORT) 160-4.5 MCG/ACT inhaler Inhale 2 puffs into the lungs in the morning and at bedtime. 1 each 6   busPIRone (BUSPAR) 15 MG tablet Take 15 mg by mouth 3 (three) times daily.     citalopram (CELEXA) 20 MG tablet Take 30 mg by mouth daily.     EPINEPHrine 0.3 mg/0.3 mL IJ SOAJ injection Inject 0.3 mg into the muscle as needed for anaphylaxis. 1 each 0   esomeprazole (NEXIUM) 20 MG capsule Take 1 capsule (20 mg total) by mouth 2 (two) times daily before a meal. 60 capsule 1   gabapentin (NEURONTIN) 800 MG tablet Take 800 mg by mouth 3 (three) times daily.      lamoTRIgine (LAMICTAL) 200 MG tablet Take 200 mg by mouth 2 (two) times daily.     MYRBETRIQ 50 MG TB24 tablet Take 50 mg by mouth daily.     ondansetron (ZOFRAN) 4 MG tablet Take 1 tablet (4 mg total) by mouth every 8 (eight) hours as needed for nausea or vomiting. 20 tablet 6   oxyCODONE-acetaminophen (PERCOCET) 10-325 MG tablet Take 1 tablet by mouth every 4 (four) hours as needed for pain. 20 tablet 0   perphenazine (TRILAFON) 4 MG tablet Take 4 mg by mouth at bedtime.     SUMAtriptan (IMITREX) 6 MG/0.5ML SOLN injection Inject 6 mg into the skin See admin instructions. INJECT 6 MG (0.5 ml) INTO THE  SKIN AS NEEDED FOR MIGRAINE OR HEADACHE. MAY REPEAT ONCE IN 2 HOURS IF HEADACHE PERSISTS OR RECURS. MAX 2 DOSES IN 24 HOURS.     tiZANidine (ZANAFLEX) 4 MG tablet Take 1 tablet (4 mg total) by mouth every 6 (six) hours as needed for muscle spasms. 30 tablet 5   No current facility-administered medications on file prior to visit.    ALLERGIES: Allergies  Allergen Reactions   Codeine Anaphylaxis and Other (See Comments)    Cannot have ANYTHING with codeine    FAMILY HISTORY: Family History  Problem Relation Age of Onset   Depression Mother    Cancer Mother        Cervical   Alcohol abuse Father    Crohn's disease Sister    Stroke Brother 57   Migraines Daughter    GER disease  Daughter    Depression Daughter    Migraines Daughter    GER disease Daughter    Colon cancer Neg Hx    Rectal cancer Neg Hx    Stomach cancer Neg Hx       Objective:  Blood pressure 107/69, pulse 99, height '5\' 2"'$  (1.575 m), weight 207 lb (93.9 kg), last menstrual period 05/02/2019, SpO2 98 %. General: No acute distress.  Patient appears well-groomed.   Head:  Normocephalic/atraumatic Eyes:  Fundi examined but not visualized Neck: supple, no paraspinal tenderness, full range of motion Heart:  Regular rate and rhythm Lungs:  Clear to auscultation bilaterally Back: No paraspinal tenderness Neurological Exam: alert and oriented to person, place, and time.  Speech fluent and not dysarthric, language intact.  CN II-XII intact. Bulk and tone normal, muscle strength 5/5 throughout.  Sensation to light touch intact.  Finger to nose testing intact.  Gait normal   Metta Clines, DO  CC: Merri Ray, MD

## 2022-02-17 ENCOUNTER — Ambulatory Visit (INDEPENDENT_AMBULATORY_CARE_PROVIDER_SITE_OTHER): Payer: 59 | Admitting: Neurology

## 2022-02-17 ENCOUNTER — Encounter: Payer: Self-pay | Admitting: Neurology

## 2022-02-17 ENCOUNTER — Telehealth: Payer: Self-pay | Admitting: Pharmacy Technician

## 2022-02-17 VITALS — BP 107/69 | HR 99 | Ht 62.0 in | Wt 207.0 lb

## 2022-02-17 DIAGNOSIS — G43109 Migraine with aura, not intractable, without status migrainosus: Secondary | ICD-10-CM

## 2022-02-17 MED ORDER — SUMATRIPTAN SUCCINATE 6 MG/0.5ML ~~LOC~~ SOLN: 6.0000 mg | SUBCUTANEOUS | 5 refills | Status: DC

## 2022-02-17 NOTE — Patient Instructions (Signed)
Refilled sumatriptan shot.  When you get a migraine, try Nurtec (1 in 24 hours).  Let me know if you want a prescription Increase Vyepti to '300mg'$  every 3 months Limit use of pain relievers to no more than 2 days out of week to prevent risk of rebound or medication-overuse headache. Follow up in 7 months.

## 2022-02-17 NOTE — Telephone Encounter (Signed)
Dr. Tomi Likens,  Juluis Rainier note:  Auth Submission: [place] = '300MG'$  [id number].  Payer: [employer] Medication & CPT/J Code(s) submitted: Vyepti (Eptinezumab) [phone] Route of submission (phone, fax, portal): PORTAL Phone # Fax # Auth type: Buy/Bill Units/visits requested: 4 Reference number: [id number] Approval from: 02/17/22 to 02/18/23

## 2022-02-20 ENCOUNTER — Ambulatory Visit (INDEPENDENT_AMBULATORY_CARE_PROVIDER_SITE_OTHER): Payer: 59

## 2022-02-20 ENCOUNTER — Ambulatory Visit: Payer: 59

## 2022-02-20 DIAGNOSIS — M205X1 Other deformities of toe(s) (acquired), right foot: Secondary | ICD-10-CM

## 2022-02-20 NOTE — Progress Notes (Signed)
POV #1 DOS 02/14/2022 [place] BUNIONECTOMY RT, EXC NAIL PERM 3RD RT/DR [facility] PT    Patient is in office today for post op number one she has been in a walking boot encouraged to transition to surgical shoe. Bandages dry and intact. Incision site is intact. No injuries or falls to the foot.  Denies any pain in the calf or shortness of breath noted.   Patient is to return to the clinic in 3 weeks

## 2022-02-22 ENCOUNTER — Telehealth: Payer: Self-pay | Admitting: *Deleted

## 2022-02-22 ENCOUNTER — Other Ambulatory Visit: Payer: Self-pay | Admitting: Podiatry

## 2022-02-22 MED ORDER — HYDROMORPHONE HCL 4 MG PO TABS: 4.0000 mg | ORAL_TABLET | ORAL | 0 refills | Status: DC | PRN

## 2022-02-22 MED ORDER — ONDANSETRON HCL 4 MG PO TABS: 4.0000 mg | ORAL_TABLET | Freq: Three times a day (TID) | ORAL | 0 refills | Status: DC | PRN

## 2022-02-22 NOTE — Telephone Encounter (Signed)
Patient is calling to request something else for pain, up all night crying from the pain, oxycodone is making her sick and not controlling the pain very well, requesting something for nausea as well, please advise.

## 2022-02-22 NOTE — Telephone Encounter (Signed)
Called her in medicine. If not improved come in next week

## 2022-02-26 ENCOUNTER — Encounter: Payer: Self-pay | Admitting: Podiatry

## 2022-02-26 MED ORDER — DOXYCYCLINE HYCLATE 100 MG PO TABS: 100.0000 mg | ORAL_TABLET | Freq: Two times a day (BID) | ORAL | 0 refills | Status: DC

## 2022-02-27 ENCOUNTER — Ambulatory Visit (INDEPENDENT_AMBULATORY_CARE_PROVIDER_SITE_OTHER): Payer: 59

## 2022-02-27 ENCOUNTER — Ambulatory Visit (INDEPENDENT_AMBULATORY_CARE_PROVIDER_SITE_OTHER): Payer: 59 | Admitting: Podiatry

## 2022-02-27 ENCOUNTER — Encounter: Payer: Self-pay | Admitting: Podiatry

## 2022-02-27 VITALS — BP 113/52 | HR 71 | Temp 97.9°F

## 2022-02-27 DIAGNOSIS — Z9889 Other specified postprocedural states: Secondary | ICD-10-CM

## 2022-02-28 NOTE — Progress Notes (Signed)
Subjective:   Patient ID: Lauren Chapman, female   DOB: 52 y.o.   MRN: [id number]   HPI Patient presents stating I had quite a bit of drainage I was active on my foot and I started to develop redness.  I been on an antibiotic for the last 36 hours and doing quite a bit better I had no fever or systemic signs of infection   ROS      Objective:  Physical Exam  Neurovascular status intact with patient found to have mild redness of the mid incision site right first metatarsal localized no proximal edema erythema noted mild serous drainage no purulent drainage noted no indication systemic infection     Assessment:  Low-grade dehiscence of the right incision with patient being active swelling which created stress on the incision     Plan:  H&P x-ray reviewed sterile dressing reapplied and I applied Neosporin to the area that I want her to leave on continue antibiotics continue elevation strict instructions of any systemic signs of infection were to occur or pathology to let us know immediately and that this should heal uneventfully but I want to see her back again in 1 week.  All questions answered  X-rays indicate that the bone structure looks good healing well fixation in place no indication of movement

## 2022-03-06 ENCOUNTER — Other Ambulatory Visit: Payer: Medicare Other

## 2022-03-06 NOTE — Telephone Encounter (Signed)
Patient has been scheduled for appointment 03/08/22

## 2022-03-08 ENCOUNTER — Encounter: Payer: Self-pay | Admitting: Podiatry

## 2022-03-08 ENCOUNTER — Ambulatory Visit (INDEPENDENT_AMBULATORY_CARE_PROVIDER_SITE_OTHER): Payer: 59 | Admitting: Podiatry

## 2022-03-08 DIAGNOSIS — L02619 Cutaneous abscess of unspecified foot: Secondary | ICD-10-CM

## 2022-03-08 DIAGNOSIS — L03119 Cellulitis of unspecified part of limb: Secondary | ICD-10-CM

## 2022-03-08 NOTE — Progress Notes (Signed)
Subjective:   Patient ID: Lauren Chapman, female   DOB: 52 y.o.   MRN: [id number]   HPI Patient presents stating she has had some redness on the top of the foot and states that it is improving there is no longer any drainage and it seems sealed mild discomfort swelling.  Just finished antibiotics last night   ROS      Objective:  Physical Exam  Neurovascular status found to be intact with light redness around the incision site right localized no drainage no edema no erythema noted proximal no systemic signs of infection no gapping of the incision     Assessment:  Appears to be doing better after having mild dehiscence around the first metatarsal shaft right from previous surgery     Plan:  Reviewed with patient organist try to stop the antibiotic which she will continue with soaks bandage usage and compression and come back again in 2 weeks.  Strict instructions if any changes were to occur any further redness or any systemic indication of infection patient is to come in right away

## 2022-03-13 ENCOUNTER — Encounter: Payer: Medicare Other | Admitting: Podiatry

## 2022-03-13 LAB — HM MAMMOGRAPHY

## 2022-03-14 ENCOUNTER — Encounter: Payer: Self-pay | Admitting: Family Medicine

## 2022-03-16 ENCOUNTER — Telehealth: Payer: Self-pay | Admitting: *Deleted

## 2022-03-16 NOTE — Telephone Encounter (Signed)
Have her run in tomorrow morning

## 2022-03-16 NOTE — Telephone Encounter (Signed)
Patient is calling because the toe(right great), is still swollen, red, real puffy, hurts to walk on it , has been stretching and was told to call back if no improvements, has not , has completed the antibiotics prescribed by Dr Eric Form advise.

## 2022-03-17 ENCOUNTER — Encounter: Payer: Self-pay | Admitting: Podiatry

## 2022-03-17 ENCOUNTER — Ambulatory Visit (INDEPENDENT_AMBULATORY_CARE_PROVIDER_SITE_OTHER): Payer: 59 | Admitting: Podiatry

## 2022-03-17 DIAGNOSIS — M205X1 Other deformities of toe(s) (acquired), right foot: Secondary | ICD-10-CM

## 2022-03-18 NOTE — Progress Notes (Signed)
Subjective:   Patient ID: Lauren Chapman, female   DOB: 52 y.o.   MRN: [id number]   HPI Patient states the incision right has healed I do still get some swelling around the joint and some discomfort underneath and I just wanted to get it checked   ROS      Objective:  Physical Exam  Neurovascular status intact negative Bevelyn Buckles' sign noted mild edema around the first metatarsal segment with good range of motion nail third right and just has generalized swelling of the foot no drainage was noted no erythema or other pathology     Assessment:  Probability for some stress against the osteotomy site right but I did not note any significant deformity and nail site healed well right with dehiscence which is healed uneventfully     Plan:  H&P x-ray reviewed and discussed that there is not been any movement of the bone but there is some stress so I like her to continue to immobilize and reappoint for her regular visit in several weeks strict instructions of any increased redness or any indications of infection systemically or locally she is to let us know immediately if or if any drainage were to occur  X-rays indicate there is no current movement of the first metatarsal mild stress on the osteotomy site

## 2022-03-24 ENCOUNTER — Ambulatory Visit (INDEPENDENT_AMBULATORY_CARE_PROVIDER_SITE_OTHER): Payer: 59 | Admitting: *Deleted

## 2022-03-24 VITALS — BP 114/67 | HR 65 | Temp 97.5°F | Resp 16 | Ht 63.0 in | Wt 215.8 lb

## 2022-03-24 DIAGNOSIS — G43709 Chronic migraine without aura, not intractable, without status migrainosus: Secondary | ICD-10-CM

## 2022-03-24 MED ORDER — SODIUM CHLORIDE 0.9 % IV SOLN
300.0000 mg | Freq: Once | INTRAVENOUS | Status: AC
  Administered 2022-03-24: 300 mg via INTRAVENOUS
  Filled 2022-03-24: qty 3

## 2022-03-24 NOTE — Progress Notes (Signed)
Diagnosis: Chronic Migraine  Provider:  Marshell Garfinkel MD  Procedure: Infusion  IV Type: Peripheral, IV Location: L Hand  Vyepti (Eptinezumab-jjmr), Dose: 300 mg  Infusion Start Time: [id number] am  Infusion Stop Time: [id number] am  Post Infusion IV Care: Observation period completed and Peripheral IV Discontinued  Discharge: Condition: Good, Destination: Home . AVS Provided and AVS Declined  Performed by:  Oren Beckmann, RN

## 2022-03-29 ENCOUNTER — Ambulatory Visit (INDEPENDENT_AMBULATORY_CARE_PROVIDER_SITE_OTHER): Payer: 59

## 2022-03-29 ENCOUNTER — Encounter: Payer: Self-pay | Admitting: Podiatry

## 2022-03-29 ENCOUNTER — Ambulatory Visit (INDEPENDENT_AMBULATORY_CARE_PROVIDER_SITE_OTHER): Payer: 59 | Admitting: Podiatry

## 2022-03-29 DIAGNOSIS — M205X1 Other deformities of toe(s) (acquired), right foot: Secondary | ICD-10-CM | POA: Diagnosis not present

## 2022-03-29 MED ORDER — DICLOFENAC SODIUM 75 MG PO TBEC: 75.0000 mg | DELAYED_RELEASE_TABLET | Freq: Two times a day (BID) | ORAL | 2 refills | Status: DC

## 2022-03-29 NOTE — Progress Notes (Signed)
Subjective:   Patient ID: Lauren Chapman, female   DOB: 52 y.o.   MRN: [id number]   HPI Patient states overall I seem to improve but I still getting some swelling in my right forefoot no drainage   ROS      Objective:  Physical Exam  Neurovascular status intact negative Bevelyn Buckles' sign noted right foot overall incision site has healed well but there is still some swelling around the first MPJ and into the midfoot localized     Assessment:  Incision site well-healed right no drainage no dehiscence noted with low-grade moderate swelling     Plan:  H&P x-ray reviewed dispensed ankle compression stocking advised on continued elevation gradual return to normal shoe gear and placed on diclofenac 75 mg twice a day.  Reappoint to recheck 5 weeks or earlier if needed  X-rays indicate the osteotomy is stable where it is fixation in place

## 2022-03-31 ENCOUNTER — Telehealth: Payer: Self-pay | Admitting: *Deleted

## 2022-03-31 ENCOUNTER — Other Ambulatory Visit (INDEPENDENT_AMBULATORY_CARE_PROVIDER_SITE_OTHER): Payer: 59 | Admitting: Podiatry

## 2022-03-31 DIAGNOSIS — M205X1 Other deformities of toe(s) (acquired), right foot: Secondary | ICD-10-CM

## 2022-03-31 DIAGNOSIS — Z9889 Other specified postprocedural states: Secondary | ICD-10-CM

## 2022-03-31 MED ORDER — OXYCODONE-ACETAMINOPHEN 5-325 MG PO TABS: 1.0000 | ORAL_TABLET | ORAL | 0 refills | Status: AC | PRN

## 2022-03-31 NOTE — Telephone Encounter (Signed)
Patient updated.

## 2022-03-31 NOTE — Telephone Encounter (Signed)
Patient is calling to request a refill on her pain medicine( Percocet-10-325 mg), has been having pain while going to work each night,please advise

## 2022-03-31 NOTE — Progress Notes (Signed)
Percocet refill sent per pt request

## 2022-04-17 ENCOUNTER — Ambulatory Visit: Payer: Medicare Other | Admitting: Dietician

## 2022-04-20 ENCOUNTER — Encounter: Payer: Self-pay | Admitting: Podiatry

## 2022-04-20 ENCOUNTER — Ambulatory Visit (INDEPENDENT_AMBULATORY_CARE_PROVIDER_SITE_OTHER): Payer: 59 | Admitting: Podiatry

## 2022-04-20 ENCOUNTER — Ambulatory Visit (INDEPENDENT_AMBULATORY_CARE_PROVIDER_SITE_OTHER): Payer: 59

## 2022-04-20 VITALS — BP 116/58 | HR 68

## 2022-04-20 DIAGNOSIS — M722 Plantar fascial fibromatosis: Secondary | ICD-10-CM

## 2022-04-20 DIAGNOSIS — M205X1 Other deformities of toe(s) (acquired), right foot: Secondary | ICD-10-CM

## 2022-04-20 MED ORDER — TRIAMCINOLONE ACETONIDE 10 MG/ML IJ SUSP
10.0000 mg | Freq: Once | INTRAMUSCULAR | Status: AC
  Administered 2022-04-20: 10 mg

## 2022-04-20 NOTE — Progress Notes (Signed)
Subjective:   Patient ID: Lauren Chapman, female   DOB: 52 y.o.   MRN: [id number]   HPI Patient states getting some pain still around the big toe joint right but it seems like it is worse in the right arch and it is hard for me to stretch it   ROS      Objective:  Physical Exam  Neurovascular status intact with inflammation of the right mid arch band fluid buildup and discomfort with pressure     Assessment:  Fasciitis that has fired up probably due to gait change as she recovers from the big toe joint surgery     Plan:  H&P x-ray reviewed did discuss there could have been some slight movement during healing but overall looks stable with good motion of the joint no pain no crepitus and went ahead today organ to focus on the fascia I did do a mid arch injection 3 mg Kenalog 5 mg Xylocaine dispensed night splint that I want her to start using heat ice therapy and patient will be seen back 4 weeks.  Continue using diclofenac  X-rays indicate the osteotomy is not showing any signs of movement from where it was there is some slight secondary bone healing dorsal and there is possibility of a mild crack within the osteotomy site but it appears stable with fixation holding

## 2022-04-27 ENCOUNTER — Ambulatory Visit: Payer: Self-pay | Admitting: Dietician

## 2022-05-03 ENCOUNTER — Other Ambulatory Visit: Payer: 59

## 2022-05-04 ENCOUNTER — Telehealth: Payer: Self-pay | Admitting: Podiatry

## 2022-05-04 ENCOUNTER — Encounter: Payer: Self-pay | Admitting: Dietician

## 2022-05-04 ENCOUNTER — Encounter: Payer: 59 | Attending: Family Medicine | Admitting: Dietician

## 2022-05-04 VITALS — Wt 212.0 lb

## 2022-05-04 DIAGNOSIS — E669 Obesity, unspecified: Secondary | ICD-10-CM | POA: Insufficient documentation

## 2022-05-04 DIAGNOSIS — Z6837 Body mass index (BMI) 37.0-37.9, adult: Secondary | ICD-10-CM | POA: Insufficient documentation

## 2022-05-04 DIAGNOSIS — Z713 Dietary counseling and surveillance: Secondary | ICD-10-CM | POA: Diagnosis not present

## 2022-05-04 DIAGNOSIS — Z87898 Personal history of other specified conditions: Secondary | ICD-10-CM | POA: Diagnosis not present

## 2022-05-04 NOTE — Telephone Encounter (Signed)
Patient called and stated her foot is in a lot of pain and it seems to be getting worse. She states its still swollen. She is icing it, been taking her antiinflammatory, and wearing compressing stockings  She would like to know what else she can do or does she need to come in for an appointment.

## 2022-05-04 NOTE — Telephone Encounter (Signed)
Have her come in late next week to see me and continue anti inflammatory

## 2022-05-04 NOTE — Progress Notes (Signed)
Medical Nutrition Therapy  Appointment Start time:  1030 Appointment End time:  1100   Primary concerns today: Pt states her weight has been up and down since she had her daughter 20+ years ago. Quit smoking in June 2023 and states weight has crept up and pt wants to get back to feeling healthy.    Referral diagnosis: E66.9 Preferred learning style: no preference indicated Learning readiness: ready     NUTRITION ASSESSMENT    Anthropometrics  Ht: 63in Wt 11/10/21: 209.3 lbs Wt 01/11/22: 215.8 lbs Wt 05/04/22: 212 lbs   Clinical Medical Hx: allergies, anxiety, asthma, COPD, depression, GERD, prediabetes Medications: reviewed Labs: 11/07/21: A1C 6.1% Notable Signs/Symptoms: stomach pain, acid reflux. Food Allergies: none   Lifestyle & Dietary Hx   Pt reports some frustration about weight fluctuations and about her parents nagging her about her food choices.   Pt states she had foot surgery in January and feels that its not healing properly. She states she wasn't able to work for a month but is now back at work.    Pt states she was walking with her dad 2x/wk before her foot surgery but has now been unable to and won't be able to until her foot is healed.   Pt states she totally quit drinking soda and switched to sugar free candy.   Pt reports she has started eating breakfast, more vegetables, fruits, and packing a balanced snack/meal for work. Pt states on 'cereal night' she will try to choose salad with veggies and a boiled egg.    Estimated daily fluid intake: 64 oz Supplements: MVI Sleep: 10-12 hours Stress / self-care: moderate stress Current average weekly physical activity: ADLs   24-Hr Dietary Recall First Meal: boiled eggs with bacon and 1/2 of a blueberry muffin Snack:  broccoli and carrots with ranch Second Meal: peanut butter toast and egg OR salad with egg, and apple and peanut butter Snack: bagel and cream cheese Third Meal: taco soup  Snack:  none Beverages: 5 infused water bottles, unsweet tea, coffee with flavored cream   NUTRITION DIAGNOSIS  NB-1.1 Food and nutrition-related knowledge deficit As related to lack of prior nutrition education.  As evidenced by pt report and diet history.     NUTRITION INTERVENTION  Nutrition education (E-1) on the following topics:  Importance of physical activity and physical activity goal when pt is able to walk again Benefits of consistent meal/snack times and avoiding skipping meals MyPlate Sugar content in soda and sugar sweetened beverages Hunger and fullness cues, mindfulness while eating Need for fruit, whole grains, and starchy vegetables in the diet as healthful carbohydrate sources   Handouts Provided Include  No handouts provided on this follow-up   Learning Style & Readiness for Change Teaching method utilized: Visual & Auditory  Demonstrated degree of understanding via: Teach Back  Barriers to learning/adherence to lifestyle change: none   Goals Established by Pt Aim for 150 minutes of physical activity weekly Aim to eat within 1-2 hours of waking up, and every 3-5 hours following.  Balanced snack for work: choose at least a carb and protein!   Previous Goals: (pt wants to continue) Goal: Walk for 30 minutes 2x/wk. - goal not met Goal: Limit to 1 small can soda per week. - goal met   MONITORING & EVALUATION Dietary intake, weekly physical activity, and follow up in 4 months.   Next Steps  Patient is to call for questions.

## 2022-05-05 ENCOUNTER — Telehealth: Payer: Self-pay | Admitting: Podiatry

## 2022-05-05 MED ORDER — TRAMADOL HCL 50 MG PO TABS: 50.0000 mg | ORAL_TABLET | Freq: Four times a day (QID) | ORAL | 0 refills | Status: AC | PRN

## 2022-05-05 NOTE — Telephone Encounter (Signed)
Patient called answering service having severe pain after work today.  She is trying to ice and elevate it when she is not on it.  Still taking gabapentin 8 or milligrams 3 times a day as well as the diclofenac twice a day.  She is not taking Tylenol advised her to do this as well.  Rx for tramadol sent to pharmacy, take only as needed as little as possible up to 4 times daily.  She will keep her appoint with Dr. Charlsie Merles next week for follow-up.

## 2022-05-10 ENCOUNTER — Ambulatory Visit (INDEPENDENT_AMBULATORY_CARE_PROVIDER_SITE_OTHER): Payer: 59 | Admitting: Family Medicine

## 2022-05-10 ENCOUNTER — Encounter: Payer: Self-pay | Admitting: Family Medicine

## 2022-05-10 VITALS — BP 128/76 | HR 74 | Temp 98.3°F | Ht 63.0 in | Wt 206.8 lb

## 2022-05-10 DIAGNOSIS — J449 Chronic obstructive pulmonary disease, unspecified: Secondary | ICD-10-CM

## 2022-05-10 DIAGNOSIS — R7303 Prediabetes: Secondary | ICD-10-CM | POA: Diagnosis not present

## 2022-05-10 LAB — COMPREHENSIVE METABOLIC PANEL
ALT: 37 U/L — ABNORMAL HIGH (ref 0–35)
AST: 25 U/L (ref 0–37)
Albumin: 4.6 g/dL (ref 3.5–5.2)
Alkaline Phosphatase: 79 U/L (ref 39–117)
BUN: 13 mg/dL (ref 6–23)
CO2: 29 mEq/L (ref 19–32)
Calcium: 9.8 mg/dL (ref 8.4–10.5)
Chloride: 102 mEq/L (ref 96–112)
Creatinine, Ser: 1.13 mg/dL (ref 0.40–1.20)
GFR: 55.34 mL/min — ABNORMAL LOW (ref >60.00)
Glucose, Bld: 87 mg/dL (ref 70–99)
Potassium: 4.7 mEq/L (ref 3.5–5.1)
Sodium: 141 mEq/L (ref 135–145)
Total Bilirubin: 0.3 mg/dL (ref 0.2–1.2)
Total Protein: 7.3 g/dL (ref 6.0–8.3)

## 2022-05-10 LAB — HEMOGLOBIN A1C: Hgb A1c MFr Bld: 5.8 % (ref 4.6–6.5)

## 2022-05-10 NOTE — Progress Notes (Signed)
Subjective:  Patient ID: Lauren Chapman, female    DOB: Dec 06, 1970  Age: 52 y.o. MRN: [id number]  CC:  Chief Complaint  Patient presents with   COPD   Depression    HPI Lauren Chapman presents for   COPD: Last discussed in October.  COPD with pneumonia last July and hypoxic respiratory failure.  Has been followed by pulmonary, treated with Symbicort, albuterol as needed and CPAP for OSA.  Quit smoking, was not needing oxygen at that time had restarted her CPAP at that time.  Consistently using.  Symbicort was working well as of her October visit.  We talked about 1 800-quit-now which she had used to help manage the cravings with prior tobacco use. Still avoiding smoking. Still occasional cravings - calls 1-800-quit-now. Symbicort daily working well. Only used albuterol 3 times past few months.    Prediabetes: Diet/exercise approach.  Last testing in October. Lipids normal then. Has lost weight  10# from 11/2021.  cut out soda and candy - more salads with protein. Working with nutritionist. Walking limited by recent foot surgery - podiatry. Surgery in January.  Lab Results  Component Value Date   HGBA1C 6.1 11/07/2021   Wt Readings from Last 3 Encounters:  05/10/22 206 lb 12.8 oz (93.8 kg)  05/04/22 212 lb (96.2 kg)  03/24/22 215 lb 12.8 oz (97.9 kg)   Bipolar disorder Followed by psychiatry, Dr. Betti Cruz.  Medication management by psychiatry.  Followed by counselor, Moreen Fowler.  Going well.     05/10/2022   11:14 AM 12/22/2021    2:16 PM 11/10/2021    9:49 AM 11/07/2021   11:00 AM 11/02/2021    8:53 AM  Depression screen PHQ 2/9  Decreased Interest 1 1 1 2  0  Down, Depressed, Hopeless 2 0 1 1 0  PHQ - 2 Score 3 1 2 3  0  Altered sleeping 1 1  1    Tired, decreased energy 1 1  0   Change in appetite 0 0  3   Feeling bad or failure about yourself  1 1  2    Trouble concentrating 0 1  0   Moving slowly or fidgety/restless 0 0  0   Suicidal thoughts 0 0     PHQ-9 Score 6 5   9    Difficult doing work/chores Somewhat difficult  Somewhat difficult       History Patient Active Problem List   Diagnosis Date Noted   Moderate persistent asthma 09/05/2021   Syncope 08/02/2021   Tobacco abuse 08/02/2021   Vaginal candidiasis 08/02/2021   CAP (community acquired pneumonia) 07/18/2021   OSA (obstructive sleep apnea) 07/18/2021   Hypokalemia 07/18/2021   Depression 03/24/2020   Bilateral low back pain with left-sided sciatica 03/24/2020   Left lumbar radiculopathy 02/25/2020   Absence of bladder continence 02/25/2020   History of postmenopausal bleeding 04/29/2019   Polypharmacy 04/03/2019   Emphysema, unspecified 05/17/2018   Weakness 01/25/2017   Chronic migraine w/o aura w/o status migrainosus, not intractable 12/22/2014   Encounter for other general counseling or advice on contraception 09/02/2014   Bipolar disorder 10/12/2011   Anxiety and depression 10/12/2011   GERD (gastroesophageal reflux disease) 10/12/2011   Past Medical History:  Diagnosis Date   Allergy    Anxiety    Bipolar affective psychosis    Complication of anesthesia    " Acting silly- waving at everyone"-Giddy   Depression    Emphysema lung    inhaler prn,  followed by  pcp and pulmonologist-- dr [name]   Family history of adverse reaction to anesthesia    Father had rheumatic fever, and died on operating table.   GERD (gastroesophageal reflux disease)    Kidney cysts 01/24/2020   Right Kidney   Migraine    Moderate persistent asthma 09/05/2021   Smokers' cough    per pt not productive   SUI (stress urinary incontinence, female)    Tubular adenoma of colon 07/08/2020   Wears glasses    Past Surgical History:  Procedure Laterality Date   ABDOMINAL HYSTERECTOMY N/A    Phreesia 11/30/2019   CARPAL TUNNEL RELEASE Right 09-12-2007   @[facility]    and GANGLION CYST EXCISION   COLONOSCOPY     CYSTOSCOPY N/A 04/29/2019   Procedure: CYSTOSCOPY;  Surgeon: [name], MD;   Location: [facility];  Service: Gynecology;  Laterality: N/A;   DILATATION & CURETTAGE/HYSTEROSCOPY WITH MYOSURE N/A 11/13/2018   Procedure: DILATATION & CURETTAGE/HYSTEROSCOPY WITH MYOSURE;  Surgeon: [name], MD;  Location: [facility];  Service: Gynecology;  Laterality: N/A;  request 9:00am OR start time in [place] [facility] block requests one hour   DILATION AND CURETTAGE OF UTERUS  02/2019   EYE SURGERY N/A    Phreesia 11/30/2019   GANGLION CYST EXCISION     R wrist   interstem      implant for overactive bladder   KNEE ARTHROSCOPY WITH ANTERIOR CRUCIATE LIGAMENT (ACL) REPAIR Right 10/2015   LAPAROSCOPIC CHOLECYSTECTOMY  1997   LAPAROSCOPIC HYSTERECTOMY Bilateral 04/29/2019   Procedure: HYSTERECTOMY TOTAL LAPAROSCOPIC, BILATERAL SALPINO-OOPHORECTOMY;  Surgeon: [name], MD;  Location: [facility];  Service: Gynecology;  Laterality: Bilateral;   TOOTH EXTRACTION  04/01/2019   tooth removal Bilateral 2022   4 teeth removed   UPPER GASTROINTESTINAL ENDOSCOPY     Allergies  Allergen Reactions   Codeine Anaphylaxis and Other (See Comments)    Cannot have ANYTHING with codeine   Prior to Admission medications   Medication Sig Start Date End Date Taking? Authorizing Provider  albuterol (VENTOLIN HFA) 108 (90 Base) MCG/ACT inhaler Inhale 2 puffs into the lungs every 4 (four) hours as needed for wheezing or shortness of breath. Patient taking differently: Inhale 2 puffs into the lungs every hour as needed for wheezing or shortness of breath. 02/20/20  Yes Olalere, Adewale A, MD  ARIPiprazole (ABILIFY) 30 MG tablet Take 30 mg by mouth at bedtime. 12/23/20  Yes [provider]  benztropine (COGENTIN) 0.5 MG tablet Take by mouth. 12/07/21  Yes [provider]  budesonide-formoterol (SYMBICORT) 160-4.5 MCG/ACT inhaler Inhale 2 puffs into the lungs in the morning and at bedtime. 09/05/21  Yes Cobb, Ruby Cola, NP   buPROPion (WELLBUTRIN XL) 150 MG 24 hr tablet Take 150 mg by mouth every morning. 04/06/22  Yes [provider]  busPIRone (BUSPAR) 15 MG tablet Take 15 mg by mouth 3 (three) times daily. 07/30/20  Yes [provider]  citalopram (CELEXA) 20 MG tablet Take 30 mg by mouth daily.   Yes [provider]  diclofenac (VOLTAREN) 75 MG EC tablet Take 1 tablet (75 mg total) by mouth 2 (two) times daily. 03/29/22  Yes Regal, Kirstie Peri, DPM  doxycycline (VIBRA-TABS) 100 MG tablet Take 1 tablet (100 mg total) by mouth 2 (two) times daily. 02/26/22  Yes Candelaria Stagers, DPM  EPINEPHrine 0.3 mg/0.3 mL IJ SOAJ injection Inject 0.3 mg into the muscle as needed for anaphylaxis. 03/29/21  Yes Delice Bison  F, PA-C  esomeprazole (NEXIUM) 20 MG capsule Take 1 capsule (20 mg total) by mouth 2 (two) times daily before a meal. 03/21/19  Yes Lezlie Lye, Meda Coffee, MD  gabapentin (NEURONTIN) 800 MG tablet Take 800 mg by mouth 3 (three) times daily.  07/02/18  Yes [provider]  lamoTRIgine (LAMICTAL) 200 MG tablet Take 200 mg by mouth 2 (two) times daily. 12/20/21  Yes [provider]  MYRBETRIQ 50 MG TB24 tablet Take 50 mg by mouth daily. 11/11/21  Yes [provider]  ondansetron (ZOFRAN) 4 MG tablet Take 1 tablet (4 mg total) by mouth every 8 (eight) hours as needed for nausea or vomiting. 01/05/20  Yes Glean Salvo, NP  ondansetron (ZOFRAN) 4 MG tablet Take 1 tablet (4 mg total) by mouth every 8 (eight) hours as needed for nausea or vomiting. 02/22/22  Yes Regal, Kirstie Peri, DPM  SUMAtriptan (IMITREX) 6 MG/0.5ML SOLN injection Inject 0.5 mLs (6 mg total) into the skin See admin instructions. INJECT 6 MG (0.5 ml) INTO THE SKIN AS NEEDED FOR MIGRAINE OR HEADACHE. MAY REPEAT ONCE IN 2 HOURS IF HEADACHE PERSISTS OR RECURS. MAX 2 DOSES IN 24 HOURS. 02/17/22  Yes Everlena Cooper, Adam R, DO  thiothixene (NAVANE) 5 MG capsule Take 5 mg by mouth 3 (three) times daily. 04/07/22  Yes [provider]  tiZANidine (ZANAFLEX) 4 MG tablet Take 1 tablet (4 mg total) by mouth every 6 (six) hours as needed for muscle spasms. 08/15/21  Yes Jaffe, Adam R, DO  traMADol (ULTRAM) 50 MG tablet Take 1 tablet (50 mg total) by mouth every 6 (six) hours as needed for up to 5 days. 05/05/22 05/10/22 Yes McDonald, Rachelle Hora, DPM   Social History   Socioeconomic History   Marital status: Divorced    Spouse name: n/a   Number of children: 2   Years of education: 18   Highest education level: Not on file  Occupational History   Occupation: Disabled    Comment: Formerly a Runner, broadcasting/film/video.  Tobacco Use   Smoking status: Former    Packs/day: 1.00    Years: 39.00    Additional pack years: 0.00    Total pack years: 39.00    Types: Cigarettes    Start date: 10/10/1981    Quit date: 07/18/2021    Years since quitting: 0.8    Passive exposure: Never   Smokeless tobacco: Never  Vaping Use   Vaping Use: Never used  Substance and Sexual Activity   Alcohol use: Never    Alcohol/week: 0.0 standard drinks of alcohol   Drug use: Never   Sexual activity: Not Currently    Partners: Male    Birth control/protection: Post-menopausal    Comment: 1st intercourse 52 yo-More than 5 partners  Other Topics Concern   Not on file  Social History Narrative   Lives at home with 1 of her two daughters   Right-handed.   2-4 cups caffeine daily.   Disabled    One story home   Social Determinants of Health   Financial Resource Strain: Low Risk  (11/02/2021)   Overall Financial Resource Strain (CARDIA)    Difficulty of Paying Living Expenses: Not hard at all  Food Insecurity: Food Insecurity Present (11/10/2021)   Hunger Vital Sign    Worried About Running Out of Food in the Last Year: Sometimes true    Ran Out of Food in the Last Year: Sometimes true  Transportation Needs: No Transportation Needs (11/02/2021)  PRAPARE - Administrator, Civil Service (Medical): No    Lack of Transportation  (Non-Medical): No  Physical Activity: Insufficiently Active (11/02/2021)   Exercise Vital Sign    Days of Exercise per Week: 3 days    Minutes of Exercise per Session: 30 min  Stress: No Stress Concern Present (11/02/2021)   [employer] of Occupational Health - Occupational Stress Questionnaire    Feeling of Stress : Not at all  Social Connections: Moderately Isolated (11/02/2021)   Social Connection and Isolation Panel [NHANES]    Frequency of Communication with Friends and Family: More than three times a week    Frequency of Social Gatherings with Friends and Family: More than three times a week    Attends Religious Services: More than 4 times per year    Active Member of [employer] or Organizations: No    Attends Banker Meetings: Never    Marital Status: Divorced  Catering manager Violence: Not At Risk (11/02/2021)   Humiliation, Afraid, Rape, and Kick questionnaire    Fear of Current or Ex-Partner: No    Emotionally Abused: No    Physically Abused: No    Sexually Abused: No    Review of Systems Per HPI.   Objective:   Vitals:   05/10/22 1113  BP: 128/76  Pulse: 74  Temp: 98.3 F (36.8 C)  TempSrc: Temporal  SpO2: 97%  Weight: 206 lb 12.8 oz (93.8 kg)  Height:  (1.6 m)     Physical Exam Vitals reviewed.  Constitutional:      Appearance: Normal appearance. She is well-developed.  HENT:     Head: Normocephalic and atraumatic.  Eyes:     Conjunctiva/sclera: Conjunctivae normal.     Pupils: Pupils are equal, round, and reactive to light.  Neck:     Vascular: No carotid bruit.  Cardiovascular:     Rate and Rhythm: Normal rate and regular rhythm.     Heart sounds: Normal heart sounds.  Pulmonary:     Effort: Pulmonary effort is normal.     Breath sounds: Normal breath sounds.  Abdominal:     Palpations: Abdomen is soft. There is no pulsatile mass.     Tenderness: There is no abdominal tenderness.  Musculoskeletal:     Right lower leg: No  edema.     Left lower leg: No edema.  Skin:    General: Skin is warm and dry.  Neurological:     Mental Status: She is alert and oriented to person, place, and time.  Psychiatric:        Mood and Affect: Mood normal.        Behavior: Behavior normal.        Assessment & Plan:  Lauren Chapman is a 52 y.o. female . Chronic obstructive pulmonary disease, unspecified COPD type  -Stable with Symbicort, continue same.  Commended on her continued cessation of tobacco and continued to talk with 1 800 quit now or what ever other resources needed to help maintain cessation.  Albuterol if needed for breakthrough symptoms, RTC precautions if frequent or persistent need or worsening symptoms.  55-month follow-up for physical  Prediabetes - Plan: Comprehensive metabolic panel, Hemoglobin A1c  -Commended on weight loss and improve diet.  Unfortunately exercise is limited with her foot pain, but again she is doing very well with controlling diet.  I anticipate A1c will be improved, 24-month follow-up.   Continue follow-up with psychiatry regarding bipolar disorder and medication management.  No orders of the defined types were placed in this encounter.  Patient Instructions  Keep up the good work with diet and weight loss! I expect labs to look better.  No med changes for now.  You are doing great!  Follow-up in 6 months for physical but I will let you know if there are any concerns on labs.  Take care.     Signed,   Meredith Staggers, MD [facility], [facility] 05/10/22 11:57 AM

## 2022-05-10 NOTE — Patient Instructions (Addendum)
Keep up the good work with diet and weight loss! I expect labs to look better.  No med changes for now.  You are doing great!  Follow-up in 6 months for physical but I will let you know if there are any concerns on labs.  Take care.

## 2022-05-11 ENCOUNTER — Ambulatory Visit (INDEPENDENT_AMBULATORY_CARE_PROVIDER_SITE_OTHER): Payer: 59 | Admitting: Podiatry

## 2022-05-11 ENCOUNTER — Ambulatory Visit (INDEPENDENT_AMBULATORY_CARE_PROVIDER_SITE_OTHER): Payer: 59

## 2022-05-11 ENCOUNTER — Encounter: Payer: Self-pay | Admitting: Podiatry

## 2022-05-11 DIAGNOSIS — M205X1 Other deformities of toe(s) (acquired), right foot: Secondary | ICD-10-CM

## 2022-05-11 DIAGNOSIS — M7751 Other enthesopathy of right foot: Secondary | ICD-10-CM | POA: Diagnosis not present

## 2022-05-11 MED ORDER — TRIAMCINOLONE ACETONIDE 10 MG/ML IJ SUSP
10.0000 mg | Freq: Once | INTRAMUSCULAR | Status: AC
  Administered 2022-05-11: 10 mg

## 2022-05-11 NOTE — Progress Notes (Signed)
Subjective:   Patient ID: Lauren Chapman, female   DOB: 52 y.o.   MRN: [id number]   HPI Patient presents with pain in her forefoot that is not around the big toe joint which is gradually improving but is more into the second and third joints   ROS      Objective:  Physical Exam  Neurovascular status intact negative Denna Haggard' sign noted first MPJ looks good the joint has reasonable motion no crepitus there is quite a bit of pain around the second and third MPJs right     Assessment:  Appears to be more of an inflammatory capsulitis of the lesser MPJ joints with relative good healing first metatarsal right with a small fracture but no further movement noted with slight secondary bone healing dorsal     Plan:  H&P reviewed continue oral anti-inflammatory today I did a careful periarticular injection around the second and third MPJs 3 mg dexamethasone Kenalog 5 mg Xylocaine and advised on cushioning for the area and ice as needed.  Reappoint to recheck as needed  X-rays indicate the osteotomy first metatarsal stable I did not see signs of any stress fracture second third metatarsal

## 2022-05-18 ENCOUNTER — Encounter: Payer: 59 | Admitting: Podiatry

## 2022-05-31 ENCOUNTER — Ambulatory Visit (INDEPENDENT_AMBULATORY_CARE_PROVIDER_SITE_OTHER): Payer: 59

## 2022-05-31 ENCOUNTER — Ambulatory Visit (INDEPENDENT_AMBULATORY_CARE_PROVIDER_SITE_OTHER): Payer: 59 | Admitting: Podiatry

## 2022-05-31 DIAGNOSIS — M7751 Other enthesopathy of right foot: Secondary | ICD-10-CM | POA: Diagnosis not present

## 2022-05-31 DIAGNOSIS — M205X1 Other deformities of toe(s) (acquired), right foot: Secondary | ICD-10-CM

## 2022-05-31 MED ORDER — TRAMADOL HCL 50 MG PO TABS: 50.0000 mg | ORAL_TABLET | Freq: Four times a day (QID) | ORAL | 0 refills | Status: AC | PRN

## 2022-06-01 ENCOUNTER — Encounter: Payer: 59 | Admitting: Podiatry

## 2022-06-01 NOTE — Progress Notes (Signed)
Subjective:   Patient ID: Lauren Chapman, female   DOB: 52 y.o.   MRN: [id number]   HPI Patient presents stating she seems to be gradually getting better the swelling is getting better but she still concerned about the discomfort and taking her anti-inflammatory   ROS      Objective:  Physical Exam  Neurovascular status intact negative Denna Haggard' sign noted the swelling around the first metatarsal and the lesser MPJs seems to be improving the incision site is healed well motion slightly restricted no crepitus of the joint minimal discomfort when I moved it mild pain into the arch mild pain into the lesser MPJs     Assessment:  Patient did have slight movement of the osteotomy but I do feel it continues to heal she is gradually improving with reduction of edema     Plan:  Reviewed condition again we will continue along the same course and she will continue taking oral anti-inflammatories currently.  All questions answered and this should hopefully respond we may need to place a medicine at 1 point around the first MPJ to reduce the inflammatory process but too early to do at this point  X-rays indicate there has been some slight stress on the osteotomy but it is stable as not moved all fixation holding

## 2022-06-06 ENCOUNTER — Ambulatory Visit (INDEPENDENT_AMBULATORY_CARE_PROVIDER_SITE_OTHER): Payer: 59 | Admitting: Nurse Practitioner

## 2022-06-06 ENCOUNTER — Encounter: Payer: Self-pay | Admitting: Nurse Practitioner

## 2022-06-06 VITALS — BP 92/64 | HR 63 | Temp 98.1°F | Ht 63.0 in | Wt 209.2 lb

## 2022-06-06 DIAGNOSIS — F419 Anxiety disorder, unspecified: Secondary | ICD-10-CM | POA: Diagnosis not present

## 2022-06-06 DIAGNOSIS — J4530 Mild persistent asthma, uncomplicated: Secondary | ICD-10-CM

## 2022-06-06 MED ORDER — BUDESONIDE-FORMOTEROL FUMARATE 80-4.5 MCG/ACT IN AERO
2.0000 | INHALATION_SPRAY | Freq: Two times a day (BID) | RESPIRATORY_TRACT | 5 refills | Status: DC
Start: 2022-06-06 — End: 2022-07-18

## 2022-06-06 NOTE — Progress Notes (Unsigned)
@Patient  ID: Lauren Chapman, female    DOB: March 29, 1970, 52 y.o.   MRN: [id number]  Chief Complaint  Patient presents with   Follow-up    Pt has been having to use rescue inhaler about 3 times a month, states she has been using Symbicort everyday.     Referring provider: Shade Flood, MD  HPI: 52 year old female, former smoker (Quit June 2023) followed for shortness of breath and emphysema.  She is a patient of Dr. Trena Platt and last seen in office 12/06/2021 by Allison Quarry NP. Past medical history significant for bipolar disorder, OSA on CPAP, obesity, migraines, GERD, anxiety.  She was hospitalized for acute hypoxic respiratory failure related to CAP from 07/18/2021 to 07/23/2021.  COVID/flu negative.  She was treated with Rocephin and Zithromax along with IV Solu-Medrol.  She was discharged on supplemental oxygen at 2 to 4 L and prednisone 40 mg for 7 days.  She was also prescribed Symbicort for maintenance and advised to follow-up with pulmonary outpatient.  TEST/EVENTS:  09/09/2018 PFTs: FVC 82, FEV1 84, ratio 82, TLC 102, DLCOcor 88. No BD. Normal PFTs 07/18/2021 CXR: questionable mild lingular pneumonia; clear upper lobes 07/31/2021 CXR 1 view: subtle opacity at lateral left base, similar to prior and likely atelectasis, scarring or resolving pna.  09/05/2021 PFTs: FVC 56, FEV1 58, ratio 82, TLC 83, DLCOcor 76. +21% BD response. Moderate obstruction with reversibility  09/07/2021 LDCT chest: atherosclerosis. Mild emphysema. Small solid nodule in the RUL, benign appearance. Lung RADS 2  08/02/2021: Sudie Grumbling with Ewa Hipp NP for hospital follow up. She reports that she has been slowly recovering since being discharged. She is still feeling a little fatigued and has an occasional congested, non-productive cough; however, both of these are improving. She was able to come off of her oxygen and has been in the 90's on room air. Her breathing feels much better on Symbicort and she hasn't had to use her albuterol  but 2-3 times since being discharged. Prior to admission, she was using it almost daily. She denies any fevers, night sweats, hemoptysis, anorexia, weight loss, wheezing. She has quit smoking and is currently using nicotine patches provided by her insurance to help her remain smoke free. She is taking mucinex for chest congestion/cough and using her Symbicort Twice daily.  PFTs ordered for further evaluation.  Referred to lung cancer screening program. She did go back to the ED on 7/9 after a syncopal episode. She had developed some joint pain and headache prior to the event. Respiratory status was stable with O2 98% on room air and ED workup was unremarkable. Suspect related to orthostatic or vasovagal syncope and possible from dehydration - treated with IV fluids and headache cocktail and discharged home. She reports feeling better since with no recurrent episodes. She has follow up scheduled with her PCP on Friday.  She is experiencing some itching and discomfort in her vaginal area and is concerned she may have developed a yeast infection from the antibiotics she was on. She has had a small amount of discharge. No odor, bleeding or pain. No urinary symptoms.  Treated with fluconazole x1 and instructed to follow-up with PCP if symptoms do not improve.  09/05/2021: OV with Vannie Hochstetler NP for follow-up after undergoing pulmonary function testing.  She reports that she has been doing well since she was seen last.  Feels like all of her symptoms have resolved.  She no longer has a cough and the chest congestion has gone away.  Breathing feels  like it is relatively stable.  She did stop using Symbicort and is using albuterol every other day as she still gets winded with some activities.  Does not notice any significant wheezing.  Does also occasionally have some chest tightness with the shortness of breath.  Denies any lower extremity swelling, hemoptysis, fevers, night sweats, palpitations.  She has upcoming CT chest in  2 days for lung cancer screening.  No concerns or complaints today.   12/06/2021: Ov with Lilleigh Hechavarria NP for follow up. She has been doing well since she was here last. Feels like she is better since starting on the Symbicort consistently. Activity tolerance has improved. No significant cough or chest congestion. Hasn't noticed wheezing. She has been able to decrease frequency of albuterol; previously was using it multiple times a day. Since starting Symbicort, she's down to using it once a week, at max. No concerns or complaints today.   06/06/2022: Today - follow up Patient presents today for follow up. She has been doing well from a breathing standpoint since she was here last. She rarely has to use her rescue inhaler, maybe 3 times a month. She has not had any abx or steroids for her breathing. No significant cough, chest congestion, wheezing. She does have some trouble with anxiety, especially if she has an chest tightness. Feels like it might be some PTSD related to her previous hospitalization. She does see a Veterinary surgeon and psychiatrist. She is on Abilify, celexa, wellbutrin, lamictal.   Allergies  Allergen Reactions   Codeine Anaphylaxis and Other (See Comments)    Cannot have ANYTHING with codeine    Immunization History  Administered Date(s) Administered   Influenza Inj Mdck Quad Pf 01/03/2019   Influenza Split 10/11/2011   Influenza,inj,Quad PF,6+ Mos 12/04/2017, 12/01/2019, 11/07/2021   Influenza-Unspecified 01/03/2019   PFIZER(Purple Top)SARS-COV-2 Vaccination 04/19/2019, 05/10/2019, 01/22/2020, 10/26/2020   PNEUMOCOCCAL CONJUGATE-20 08/02/2020   Tdap 10/16/2018   Zoster Recombinat (Shingrix) 05/31/2020, 08/02/2020    Past Medical History:  Diagnosis Date   Allergy    Anxiety    Bipolar affective psychosis (HCC)    Complication of anesthesia    " Acting silly- waving at everyone"-Giddy   Depression    Emphysema lung (HCC)    inhaler prn,  followed by pcp and pulmonologist-- dr  [name]   Family history of adverse reaction to anesthesia    Father had rheumatic fever, and died on operating table.   GERD (gastroesophageal reflux disease)    Kidney cysts 01/24/2020   Right Kidney   Migraine    Moderate persistent asthma 09/05/2021   Smokers' cough (HCC)    per pt not productive   SUI (stress urinary incontinence, female)    Tubular adenoma of colon 07/08/2020   Wears glasses     Tobacco History: Social History   Tobacco Use  Smoking Status Former   Packs/day: 1.00   Years: 39.00   Additional pack years: 0.00   Total pack years: 39.00   Types: Cigarettes   Start date: 10/10/1981   Quit date: 07/18/2021   Years since quitting: 0.8   Passive exposure: Never  Smokeless Tobacco Never   Counseling given: Not Answered   Outpatient Medications Prior to Visit  Medication Sig Dispense Refill   albuterol (VENTOLIN HFA) 108 (90 Base) MCG/ACT inhaler Inhale 2 puffs into the lungs every 4 (four) hours as needed for wheezing or shortness of breath. (Patient taking differently: Inhale 2 puffs into the lungs every hour as needed for wheezing or  shortness of breath.) 1 each 1   ARIPiprazole (ABILIFY) 30 MG tablet Take 30 mg by mouth at bedtime.     benztropine (COGENTIN) 0.5 MG tablet Take by mouth.     buPROPion (WELLBUTRIN XL) 150 MG 24 hr tablet Take 150 mg by mouth every morning.     busPIRone (BUSPAR) 15 MG tablet Take 15 mg by mouth 3 (three) times daily.     citalopram (CELEXA) 20 MG tablet Take 30 mg by mouth daily.     diclofenac (VOLTAREN) 75 MG EC tablet Take 1 tablet (75 mg total) by mouth 2 (two) times daily. 50 tablet 2   EPINEPHrine 0.3 mg/0.3 mL IJ SOAJ injection Inject 0.3 mg into the muscle as needed for anaphylaxis. 1 each 0   esomeprazole (NEXIUM) 20 MG capsule Take 1 capsule (20 mg total) by mouth 2 (two) times daily before a meal. 60 capsule 1   gabapentin (NEURONTIN) 800 MG tablet Take 800 mg by mouth 3 (three) times daily.      lamoTRIgine  (LAMICTAL) 200 MG tablet Take 200 mg by mouth 2 (two) times daily.     MYRBETRIQ 50 MG TB24 tablet Take 50 mg by mouth daily.     ondansetron (ZOFRAN) 4 MG tablet Take 1 tablet (4 mg total) by mouth every 8 (eight) hours as needed for nausea or vomiting. 20 tablet 6   ondansetron (ZOFRAN) 4 MG tablet Take 1 tablet (4 mg total) by mouth every 8 (eight) hours as needed for nausea or vomiting. 20 tablet 0   SUMAtriptan (IMITREX) 6 MG/0.5ML SOLN injection Inject 0.5 mLs (6 mg total) into the skin See admin instructions. INJECT 6 MG (0.5 ml) INTO THE SKIN AS NEEDED FOR MIGRAINE OR HEADACHE. MAY REPEAT ONCE IN 2 HOURS IF HEADACHE PERSISTS OR RECURS. MAX 2 DOSES IN 24 HOURS. 3 mL 5   thiothixene (NAVANE) 5 MG capsule Take 5 mg by mouth 3 (three) times daily.     tiZANidine (ZANAFLEX) 4 MG tablet Take 1 tablet (4 mg total) by mouth every 6 (six) hours as needed for muscle spasms. 30 tablet 5   traMADol (ULTRAM) 50 MG tablet Take 1 tablet (50 mg total) by mouth every 6 (six) hours as needed for up to 7 days. 28 tablet 0   budesonide-formoterol (SYMBICORT) 160-4.5 MCG/ACT inhaler Inhale 2 puffs into the lungs in the morning and at bedtime. 1 each 6   doxycycline (VIBRA-TABS) 100 MG tablet Take 1 tablet (100 mg total) by mouth 2 (two) times daily. (Patient not taking: Reported on 06/06/2022) 20 tablet 0   No facility-administered medications prior to visit.     Review of Systems:   Constitutional: No weight loss or gain, night sweats, fevers, chills, fatigue, lassitude HEENT: No headaches, difficulty swallowing, tooth/dental problems, or sore throat. No sneezing, itching, ear ache, nasal congestion, or post nasal drip CV:  No chest pain, orthopnea, PND, swelling in lower extremities, anasarca, dizziness, palpitations, syncope Resp: +shortness of breath with strenuous exertion. No cough. No excess mucus or change in color of mucus. No hemoptysis. No wheezing.  No chest wall deformity GU: No dysuria, change  in color of urine, urgency or frequency.  No flank pain, no hematuria  Skin: No rash, lesions, ulcerations MSK:  No joint pain or swelling.  No decreased range of motion.  No back pain. Neuro: No dizziness or lightheadedness.  Psych: No increased depression. +anxiety. No SI/HI. Mood stable.     Physical Exam:  BP 92/64  Pulse 63   Temp 98.1 F (36.7 C) (Oral)   Ht 5\' 3"  (1.6 m)   Wt 209 lb 3.2 oz (94.9 kg)   LMP 05/02/2019 Comment: irregular cycles  SpO2 96%   BMI 37.06 kg/m   GEN: Pleasant, interactive, well-appearing; obese; in no acute distress. HEENT:  Normocephalic and atraumatic. PERRLA. Sclera white. Nasal turbinates pink, moist and patent bilaterally. No rhinorrhea present. Oropharynx pink and moist, without exudate or edema. No lesions, ulcerations, or postnasal drip.  NECK:  Supple w/ fair ROM. No JVD present. Normal carotid impulses w/o bruits. Thyroid symmetrical with no goiter or nodules palpated. No lymphadenopathy.   CV: RRR, no m/r/g, no peripheral edema. Pulses intact, +2 bilaterally. No cyanosis, pallor or clubbing. PULMONARY:  Unlabored, regular breathing. Clear bilaterally A&P w/o wheezes/rales/rhonchi. No accessory muscle use. No dullness to percussion. GI: BS present and normoactive. Soft, non-tender to palpation. No organomegaly or masses detected. GU: Deferred by pt. MSK: No erythema, warmth or tenderness. Cap refil <2 sec all extrem. No deformities or joint swelling noted.  Neuro: A/Ox3. No focal deficits noted.   Skin: Warm, no lesions or rashe Psych: Normal affect and behavior. Judgement and thought content appropriate.     Lab Results:  CBC    Component Value Date/Time   WBC 9.2 10/30/2021 1743   RBC 4.16 10/30/2021 1743   HGB 12.4 10/30/2021 1743   HGB 15.2 03/24/2019 1510   HCT 37.5 10/30/2021 1743   HCT 44.8 03/24/2019 1510   PLT 235 10/30/2021 1743   PLT 241 03/24/2019 1510   MCV 90.1 10/30/2021 1743   MCV 94 03/24/2019 1510   MCH  29.8 10/30/2021 1743   MCHC 33.1 10/30/2021 1743   RDW 13.4 10/30/2021 1743   RDW 13.1 03/24/2019 1510   LYMPHSABS 2.3 10/30/2021 1743   LYMPHSABS 2.6 03/24/2019 1510   MONOABS 0.4 10/30/2021 1743   EOSABS 0.1 10/30/2021 1743   EOSABS 0.2 03/24/2019 1510   BASOSABS 0.0 10/30/2021 1743   BASOSABS 0.1 03/24/2019 1510    BMET    Component Value Date/Time   NA 141 05/10/2022 1200   NA 143 03/24/2019 1510   K 4.7 05/10/2022 1200   CL 102 05/10/2022 1200   CO2 29 05/10/2022 1200   GLUCOSE 87 05/10/2022 1200   BUN 13 05/10/2022 1200   BUN 10 03/24/2019 1510   CREATININE 1.13 05/10/2022 1200   CREATININE 1.12 (H) 08/17/2015 1608   CALCIUM 9.8 05/10/2022 1200   GFRNONAA >60 10/30/2021 1743   GFRAA 71 03/24/2019 1510    BNP No results found for: "BNP"   Imaging:  DG Foot 2 Views Right  Result Date: 06/01/2022 Please see detailed radiograph report in office note.  DG Foot 2 Views Right  Result Date: 05/22/2022 Please see detailed radiograph report in office note.   triamcinolone acetonide (KENALOG) 10 MG/ML injection 10 mg     Date Action Dose Route User   04/20/2022 0934 Given 10 mg Other Lenn Sink, DPM      triamcinolone acetonide (KENALOG) 10 MG/ML injection 10 mg     Date Action Dose Route User   05/11/2022 0916 Given 10 mg Other Lenn Sink, DPM           Latest Ref Rng & Units 09/05/2021   10:37 AM 09/09/2018    2:07 PM  PFT Results  FVC-Pre L 1.93  2.91   FVC-Predicted Pre % 56  82   FVC-Post L 2.32  3.04  FVC-Predicted Post % 67  85   Pre FEV1/FVC % % 82  81   Post FEV1/FCV % % 82  82   FEV1-Pre L 1.58  2.36   FEV1-Predicted Pre % 58  84   FEV1-Post L 1.91  2.50   DLCO uncorrected ml/min/mmHg 15.60  19.28   DLCO UNC% % 76  91   DLCO corrected ml/min/mmHg 15.55  18.63   DLCO COR %Predicted % 76  88   DLVA Predicted % 91  102   TLC L 4.17  5.18   TLC % Predicted % 83  102   RV % Predicted % 110  130     No results found for:  "NITRICOXIDE"      Assessment & Plan:   Mild persistent asthma Excellent control. No recent exacerbations. Last hospitalization was June 2023. She is currently maintained on Symbicort 160 mcg. We will trial de-escalation to Symbicort 80 mcg and monitor how she responds. Asthma action plan in place.  Patient Instructions  Continue Albuterol inhaler 2 puffs or 3 mL neb every 6 hours as needed for shortness of breath or wheezing. Notify if symptoms persist despite rescue inhaler/neb use. Continue nexium 20 mg Twice daily  Try decreasing Symbicort to 80 mcg 2 puffs Twice daily. Brush tongue and rinse mouth afterwards. If this isn't covered by insurance, we can go back to the 160 mcg dosing and just use 1 puff Twice daily. If you notice that you're having to use your rescue inhaler more or your breathing feels more troublesome, we will go back to your current regimen.   Talk with your psychiatrist and your counselor about the feelings of anxiety you are having surrounding your previous hospitalization    Follow up in 6 weeks with Dr Wynona Neat or Florentina Addison Trudie Cervantes,NP. If symptoms do not improve or worsen, please contact office for sooner follow up or seek emergency care.    Anxiety Worsening anxiety following hospitalization one year ago. She is on numerous psychiatric medications. Encouraged her to discuss with her psychiatrist and counselor to review coping mechanisms and current medications. No SI/HI.    I spent 30 minutes of dedicated to the care of this patient on the date of this encounter to include pre-visit review of records, face-to-face time with the patient discussing conditions above, post visit ordering of testing, clinical documentation with the electronic health record, making appropriate referrals as documented, and communicating necessary findings to members of the patients care team.  Noemi Chapel, NP 06/07/2022  Pt aware and understands NP's role.

## 2022-06-06 NOTE — Patient Instructions (Addendum)
Continue Albuterol inhaler 2 puffs or 3 mL neb every 6 hours as needed for shortness of breath or wheezing. Notify if symptoms persist despite rescue inhaler/neb use. Continue nexium 20 mg Twice daily  Try decreasing Symbicort to 80 mcg 2 puffs Twice daily. Brush tongue and rinse mouth afterwards. If this isn't covered by insurance, we can go back to the 160 mcg dosing and just use 1 puff Twice daily. If you notice that you're having to use your rescue inhaler more or your breathing feels more troublesome, we will go back to your current regimen.   Talk with your psychiatrist and your counselor about the feelings of anxiety you are having surrounding your previous hospitalization    Follow up in 6 weeks with Dr Wynona Neat or Florentina Addison Max Romano,NP. If symptoms do not improve or worsen, please contact office for sooner follow up or seek emergency care.

## 2022-06-07 ENCOUNTER — Encounter: Payer: Self-pay | Admitting: Nurse Practitioner

## 2022-06-07 NOTE — Assessment & Plan Note (Signed)
Excellent control. No recent exacerbations. Last hospitalization was June 2023. She is currently maintained on Symbicort 160 mcg. We will trial de-escalation to Symbicort 80 mcg and monitor how she responds. Asthma action plan in place.  Patient Instructions  Continue Albuterol inhaler 2 puffs or 3 mL neb every 6 hours as needed for shortness of breath or wheezing. Notify if symptoms persist despite rescue inhaler/neb use. Continue nexium 20 mg Twice daily  Try decreasing Symbicort to 80 mcg 2 puffs Twice daily. Brush tongue and rinse mouth afterwards. If this isn't covered by insurance, we can go back to the 160 mcg dosing and just use 1 puff Twice daily. If you notice that you're having to use your rescue inhaler more or your breathing feels more troublesome, we will go back to your current regimen.   Talk with your psychiatrist and your counselor about the feelings of anxiety you are having surrounding your previous hospitalization    Follow up in 6 weeks with Dr Wynona Neat or Florentina Addison Shine Mikes,NP. If symptoms do not improve or worsen, please contact office for sooner follow up or seek emergency care.

## 2022-06-07 NOTE — Assessment & Plan Note (Signed)
Worsening anxiety following hospitalization one year ago. She is on numerous psychiatric medications. Encouraged her to discuss with her psychiatrist and counselor to review coping mechanisms and current medications. No SI/HI.

## 2022-06-20 ENCOUNTER — Telehealth: Payer: Self-pay | Admitting: *Deleted

## 2022-06-20 NOTE — Telephone Encounter (Signed)
Patient is calling because her post surgery foot is causing her pain, barely can walk, would like an appointment to come in to have it evaluated. Called patient to schedule an appointment, no answer,left a message to call back to schedule.

## 2022-06-21 ENCOUNTER — Ambulatory Visit (INDEPENDENT_AMBULATORY_CARE_PROVIDER_SITE_OTHER): Payer: 59 | Admitting: Podiatry

## 2022-06-21 ENCOUNTER — Encounter: Payer: Self-pay | Admitting: Podiatry

## 2022-06-21 ENCOUNTER — Ambulatory Visit (INDEPENDENT_AMBULATORY_CARE_PROVIDER_SITE_OTHER): Payer: 59

## 2022-06-21 DIAGNOSIS — M84374A Stress fracture, right foot, initial encounter for fracture: Secondary | ICD-10-CM | POA: Diagnosis not present

## 2022-06-21 DIAGNOSIS — M205X1 Other deformities of toe(s) (acquired), right foot: Secondary | ICD-10-CM

## 2022-06-21 MED ORDER — TRAMADOL HCL 50 MG PO TABS: 50.0000 mg | ORAL_TABLET | Freq: Four times a day (QID) | ORAL | 0 refills | Status: AC | PRN

## 2022-06-21 NOTE — Progress Notes (Signed)
Subjective:   Patient ID: Lauren Chapman, female   DOB: 52 y.o.   MRN: [id number]   HPI Patient states she was doing better and returning to work full-time and then she walked fast and developed some pain in the second and third metatarsals with swelling   ROS      Objective:  Physical Exam  Neurovascular status intact with patient found to have inflammation and swelling of the right forefoot around the second and third metatarsal shaft with pain with palpation     Assessment:  Probability for stress fracture of the right second metatarsal with increased activity and probable increase blood flow during the healing process from structural bunion correction     Plan:  H&P educated her on this and she as best as possible needs to go back to the boot continue compression ice therapy oral anti-inflammatories and I want to see her back again in 3 weeks.  Did discuss stress fracture and the probability of this for this condition  X-rays indicate possibility for slight change around the second metatarsal shaft the bunion has healed at the current position with slight shortening joint congruence fixation in place

## 2022-06-26 ENCOUNTER — Ambulatory Visit (INDEPENDENT_AMBULATORY_CARE_PROVIDER_SITE_OTHER): Payer: 59

## 2022-06-26 VITALS — BP 112/77 | HR 69 | Temp 97.8°F | Resp 18 | Ht 63.0 in | Wt 208.4 lb

## 2022-06-26 DIAGNOSIS — G43709 Chronic migraine without aura, not intractable, without status migrainosus: Secondary | ICD-10-CM | POA: Diagnosis not present

## 2022-06-26 MED ORDER — SODIUM CHLORIDE 0.9 % IV SOLN
300.0000 mg | Freq: Once | INTRAVENOUS | Status: AC
  Administered 2022-06-26: 300 mg via INTRAVENOUS
  Filled 2022-06-26: qty 3

## 2022-06-26 NOTE — Progress Notes (Signed)
Diagnosis: IV Infusion  Provider:  Chilton Greathouse MD  Procedure: IV Infusion  IV Type: Peripheral, IV Location: R Hand  Vyepti (Eptinezumab-jjmr), Dose: 300 mg  Infusion Start Time: 0933  Infusion Stop Time: 1005  Post Infusion IV Care: Peripheral IV Discontinued  Discharge: Condition: Good, Destination: Home . AVS Declined  Performed by:  Loney Hering, LPN

## 2022-07-06 ENCOUNTER — Ambulatory Visit: Payer: 59 | Admitting: Family Medicine

## 2022-07-07 ENCOUNTER — Ambulatory Visit (INDEPENDENT_AMBULATORY_CARE_PROVIDER_SITE_OTHER): Payer: 59 | Admitting: Family Medicine

## 2022-07-07 ENCOUNTER — Encounter: Payer: Self-pay | Admitting: Family Medicine

## 2022-07-07 VITALS — BP 128/76 | HR 75 | Temp 98.4°F | Ht 63.0 in | Wt 206.8 lb

## 2022-07-07 DIAGNOSIS — R7989 Other specified abnormal findings of blood chemistry: Secondary | ICD-10-CM

## 2022-07-07 DIAGNOSIS — Z1329 Encounter for screening for other suspected endocrine disorder: Secondary | ICD-10-CM | POA: Diagnosis not present

## 2022-07-07 DIAGNOSIS — R7303 Prediabetes: Secondary | ICD-10-CM

## 2022-07-07 DIAGNOSIS — Z6836 Body mass index (BMI) 36.0-36.9, adult: Secondary | ICD-10-CM

## 2022-07-07 LAB — HEPATIC FUNCTION PANEL
ALT: 38 U/L — ABNORMAL HIGH (ref 0–35)
AST: 23 U/L (ref 0–37)
Albumin: 4.6 g/dL (ref 3.5–5.2)
Alkaline Phosphatase: 82 U/L (ref 39–117)
Bilirubin, Direct: 0.1 mg/dL (ref 0.0–0.3)
Total Bilirubin: 0.4 mg/dL (ref 0.2–1.2)
Total Protein: 7.6 g/dL (ref 6.0–8.3)

## 2022-07-07 LAB — TSH: TSH: 1.33 u[IU]/mL (ref 0.35–5.50)

## 2022-07-07 MED ORDER — WEGOVY 0.25 MG/0.5ML ~~LOC~~ SOAJ
0.2500 mg | SUBCUTANEOUS | 1 refills | Status: DC
Start: 2022-07-07 — End: 2022-09-18

## 2022-07-07 NOTE — Patient Instructions (Addendum)
I will refer you to surgery to discuss weight loss options and will try to order the [facility]. If cost prohibitive, I would recommend meeting with weight management specialist below. Recheck in 1 month if you do start the med. Borderline liver test last visit - possible fatty liver on prior CT. I will repeat labs, but that should improve with weight loss as well.   [facility]  [phone]

## 2022-07-07 NOTE — Progress Notes (Signed)
Subjective:  Patient ID: Lauren Chapman, female    DOB: 10-24-1970  Age: 52 y.o. MRN: [id number]  CC:  Chief Complaint  Patient presents with   Weight Check    Pt doing okay, lost just under 2 lbs notes she has been eating better, working on getting exercise, feels discouraged she isnt losing wants to discuss medication options     HPI CIARRA WELBURN presents for   Prediabetes with obesity Diet/exercise approach.  Mild elevated ALT at last visit at 37, AST normal.  Probable hepatic steatosis on 2022 CT abdomen.  Minimal weight loss with eating better.   Discouraged that she is not losing weight more than she has and would like to discuss medication options. Heaviest 217, lowest 202. Started 6 months ago. Unable to get below 100. Would like to be near 135.  Foot surgery in January, limited exercise for some continued pain. Walking at work.  She is currently on Wellbutrin, and Lamictal followed by psychiatry. Would like to consider both surgical and medication options. Unsure of coverage.  No FH of thyroid cancer or MEN syndrome, no hx of pancreatitis.  Aware of possible side effects, chronicity and possible cost concerns with GLP1 but would like to try [facility].    Lab Results  Component Value Date   HGBA1C 5.8 05/10/2022  Body mass index is 36.63 kg/m.  Wt Readings from Last 3 Encounters:  07/07/22 206 lb 12.8 oz (93.8 kg)  06/26/22 208 lb 6.4 oz (94.5 kg)  06/06/22 209 lb 3.2 oz (94.9 kg)   Lab Results  Component Value Date   TSH 0.86 09/09/2018   Followed by counseling and psychiatry regularly. Noted PHQ and GAD7. No SI.     07/07/2022    8:53 AM 05/10/2022   11:14 AM 12/22/2021    2:16 PM 11/10/2021    9:49 AM 11/07/2021   11:00 AM  Depression screen PHQ 2/9  Decreased Interest 2 1 1 1 2   Down, Depressed, Hopeless 2 2 0 1 1  PHQ - 2 Score 4 3 1 2 3   Altered sleeping 3 1 1  1   Tired, decreased energy 0 1 1  0  Change in appetite 0 0 0  3  Feeling bad or failure  about yourself  2 1 1  2   Trouble concentrating 3 0 1  0  Moving slowly or fidgety/restless 2 0 0  0  Suicidal thoughts 0 0 0    PHQ-9 Score 14 6 5  9   Difficult doing work/chores  Somewhat difficult  Somewhat difficult       07/07/2022    8:54 AM 05/10/2022   11:14 AM 08/02/2020   10:10 AM 07/05/2020   10:01 AM  GAD 7 : Generalized Anxiety Score  Nervous, Anxious, on Edge 3 2 2 2   Control/stop worrying 3 2 1 1   Worry too much - different things 3 2 1 1   Trouble relaxing 2 1 1 1   Restless 3 1 0 0  Easily annoyed or irritable 3 2 0 1  Afraid - awful might happen 3 0 0 0  Total GAD 7 Score 20 10 5 6       History Patient Active Problem List   Diagnosis Date Noted   Mild persistent asthma 09/05/2021   Syncope 08/02/2021   Tobacco abuse 08/02/2021   Vaginal candidiasis 08/02/2021   CAP (community acquired pneumonia) 07/18/2021   OSA (obstructive sleep apnea) 07/18/2021   Hypokalemia 07/18/2021   Depression  03/24/2020   Bilateral low back pain with left-sided sciatica 03/24/2020   Left lumbar radiculopathy 02/25/2020   Absence of bladder continence 02/25/2020   History of postmenopausal bleeding 04/29/2019   Polypharmacy 04/03/2019   Emphysema, unspecified (HCC) 05/17/2018   Weakness 01/25/2017   Chronic migraine w/o aura w/o status migrainosus, not intractable 12/22/2014   Encounter for other general counseling or advice on contraception 09/02/2014   Bipolar disorder (HCC) 10/12/2011   Anxiety 10/12/2011   GERD (gastroesophageal reflux disease) 10/12/2011   Past Medical History:  Diagnosis Date   Allergy    Anxiety    Bipolar affective psychosis (HCC)    Complication of anesthesia    " Acting silly- waving at everyone"-Giddy   Depression    Emphysema lung (HCC)    inhaler prn,  followed by pcp and pulmonologist-- dr [name]   Family history of adverse reaction to anesthesia    Father had rheumatic fever, and died on operating table.   GERD (gastroesophageal  reflux disease)    Kidney cysts 01/24/2020   Right Kidney   Migraine    Moderate persistent asthma 09/05/2021   Smokers' cough (HCC)    per pt not productive   SUI (stress urinary incontinence, female)    Tubular adenoma of colon 07/08/2020   Wears glasses    Past Surgical History:  Procedure Laterality Date   ABDOMINAL HYSTERECTOMY N/A    Phreesia 11/30/2019   CARPAL TUNNEL RELEASE Right 09-12-2007   @[facility]    and GANGLION CYST EXCISION   COLONOSCOPY     CYSTOSCOPY N/A 04/29/2019   Procedure: CYSTOSCOPY;  Surgeon: [name], MD;  Location: [facility];  Service: Gynecology;  Laterality: N/A;   DILATATION & CURETTAGE/HYSTEROSCOPY WITH MYOSURE N/A 11/13/2018   Procedure: DILATATION & CURETTAGE/HYSTEROSCOPY WITH MYOSURE;  Surgeon: [name], MD;  Location: [facility];  Service: Gynecology;  Laterality: N/A;  request 9:00am OR start time in [place] [facility] block requests one hour   DILATION AND CURETTAGE OF UTERUS  02/2019   EYE SURGERY N/A    Phreesia 11/30/2019   GANGLION CYST EXCISION     R wrist   interstem      implant for overactive bladder   KNEE ARTHROSCOPY WITH ANTERIOR CRUCIATE LIGAMENT (ACL) REPAIR Right 10/2015   LAPAROSCOPIC CHOLECYSTECTOMY  1997   LAPAROSCOPIC HYSTERECTOMY Bilateral 04/29/2019   Procedure: HYSTERECTOMY TOTAL LAPAROSCOPIC, BILATERAL SALPINO-OOPHORECTOMY;  Surgeon: [name], MD;  Location: [facility];  Service: Gynecology;  Laterality: Bilateral;   TOOTH EXTRACTION  04/01/2019   tooth removal Bilateral 2022   4 teeth removed   UPPER GASTROINTESTINAL ENDOSCOPY     Allergies  Allergen Reactions   Codeine Anaphylaxis and Other (See Comments)    Cannot have ANYTHING with codeine   Prior to Admission medications   Medication Sig Start Date End Date Taking? Authorizing Provider  albuterol (VENTOLIN HFA) 108 (90 Base) MCG/ACT inhaler Inhale 2 puffs into the lungs every 4 (four) hours  as needed for wheezing or shortness of breath. Patient taking differently: Inhale 2 puffs into the lungs every hour as needed for wheezing or shortness of breath. 02/20/20  Yes Olalere, Adewale A, MD  ARIPiprazole (ABILIFY) 30 MG tablet Take 30 mg by mouth at bedtime. 12/23/20  Yes [provider]  benztropine (COGENTIN) 0.5 MG tablet Take by mouth. 12/07/21  Yes [provider]  budesonide-formoterol (SYMBICORT) 80-4.5 MCG/ACT inhaler Inhale 2 puffs into the lungs in the morning and at bedtime. 06/06/22  Yes Cobb, Ruby Cola, NP  buPROPion (WELLBUTRIN XL) 150 MG 24 hr tablet Take 150 mg by mouth every morning. 04/06/22  Yes [provider]  busPIRone (BUSPAR) 15 MG tablet Take 15 mg by mouth 3 (three) times daily. 07/30/20  Yes [provider]  citalopram (CELEXA) 20 MG tablet Take 30 mg by mouth daily.   Yes [provider]  diclofenac (VOLTAREN) 75 MG EC tablet Take 1 tablet (75 mg total) by mouth 2 (two) times daily. 03/29/22  Yes Regal, Kirstie Peri, DPM  EPINEPHrine 0.3 mg/0.3 mL IJ SOAJ injection Inject 0.3 mg into the muscle as needed for anaphylaxis. 03/29/21  Yes Al Decant, PA-C  esomeprazole (NEXIUM) 20 MG capsule Take 1 capsule (20 mg total) by mouth 2 (two) times daily before a meal. 03/21/19  Yes Lezlie Lye, Meda Coffee, MD  gabapentin (NEURONTIN) 800 MG tablet Take 800 mg by mouth 3 (three) times daily.  07/02/18  Yes [provider]  lamoTRIgine (LAMICTAL) 200 MG tablet Take 200 mg by mouth 2 (two) times daily. 12/20/21  Yes [provider]  MYRBETRIQ 50 MG TB24 tablet Take 50 mg by mouth daily. 11/11/21  Yes [provider]  ondansetron (ZOFRAN) 4 MG tablet Take 1 tablet (4 mg total) by mouth every 8 (eight) hours as needed for nausea or vomiting. 01/05/20  Yes Glean Salvo, NP  SUMAtriptan (IMITREX) 6 MG/0.5ML SOLN injection Inject 0.5 mLs (6 mg total) into the skin See admin instructions. INJECT 6 MG (0.5 ml) INTO  THE SKIN AS NEEDED FOR MIGRAINE OR HEADACHE. MAY REPEAT ONCE IN 2 HOURS IF HEADACHE PERSISTS OR RECURS. MAX 2 DOSES IN 24 HOURS. 02/17/22  Yes Everlena Cooper, Adam R, DO  thiothixene (NAVANE) 5 MG capsule Take 5 mg by mouth 3 (three) times daily. 04/07/22  Yes [provider]  tiZANidine (ZANAFLEX) 4 MG tablet Take 1 tablet (4 mg total) by mouth every 6 (six) hours as needed for muscle spasms. 08/15/21  Yes Drema Dallas, DO   Social History   Socioeconomic History   Marital status: Divorced    Spouse name: n/a   Number of children: 2   Years of education: 18   Highest education level: Not on file  Occupational History   Occupation: Disabled    Comment: Formerly a Runner, broadcasting/film/video.  Tobacco Use   Smoking status: Former    Packs/day: 1.00    Years: 39.00    Additional pack years: 0.00    Total pack years: 39.00    Types: Cigarettes    Start date: 10/10/1981    Quit date: 07/18/2021    Years since quitting: 0.9    Passive exposure: Never   Smokeless tobacco: Never  Vaping Use   Vaping Use: Never used  Substance and Sexual Activity   Alcohol use: Never    Alcohol/week: 0.0 standard drinks of alcohol   Drug use: Never   Sexual activity: Not Currently    Partners: Male    Birth control/protection: Post-menopausal    Comment: 1st intercourse 52 yo-More than 5 partners  Other Topics Concern   Not on file  Social History Narrative   Lives at home with 1 of her two daughters   Right-handed.   2-4 cups caffeine daily.   Disabled    One story home   Social Determinants of Health   Financial Resource Strain: Low Risk  (11/02/2021)   Overall Financial Resource Strain (CARDIA)    Difficulty of Paying Living Expenses:  Not hard at all  Food Insecurity: Food Insecurity Present (11/10/2021)   Hunger Vital Sign    Worried About Running Out of Food in the Last Year: Sometimes true    Ran Out of Food in the Last Year: Sometimes true  Transportation Needs: No Transportation Needs (11/02/2021)    PRAPARE - Administrator, Civil Service (Medical): No    Lack of Transportation (Non-Medical): No  Physical Activity: Insufficiently Active (11/02/2021)   Exercise Vital Sign    Days of Exercise per Week: 3 days    Minutes of Exercise per Session: 30 min  Stress: No Stress Concern Present (11/02/2021)   [employer] of Occupational Health - Occupational Stress Questionnaire    Feeling of Stress : Not at all  Social Connections: Moderately Isolated (11/02/2021)   Social Connection and Isolation Panel [NHANES]    Frequency of Communication with Friends and Family: More than three times a week    Frequency of Social Gatherings with Friends and Family: More than three times a week    Attends Religious Services: More than 4 times per year    Active Member of [employer] or Organizations: No    Attends Banker Meetings: Never    Marital Status: Divorced  Catering manager Violence: Not At Risk (11/02/2021)   Humiliation, Afraid, Rape, and Kick questionnaire    Fear of Current or Ex-Partner: No    Emotionally Abused: No    Physically Abused: No    Sexually Abused: No    Review of Systems   Objective:   Vitals:   07/07/22 0855  BP: 128/76  Pulse: 75  Temp: 98.4 F (36.9 C)  TempSrc: Temporal  SpO2: 98%  Weight: 206 lb 12.8 oz (93.8 kg)  Height: 5\' 3"  (1.6 m)     Physical Exam Vitals reviewed.  Constitutional:      General: She is not in acute distress.    Appearance: Normal appearance. She is well-developed.  HENT:     Head: Normocephalic and atraumatic.  Cardiovascular:     Rate and Rhythm: Normal rate.  Pulmonary:     Effort: Pulmonary effort is normal.  Neurological:     Mental Status: She is alert and oriented to person, place, and time.  Psychiatric:        Mood and Affect: Mood normal.      33 minutes spent during visit, including chart review, counseling and assimilation of information, exam, discussion of weight loss meds, side  effects, potential cost, medication treatment options, formulation of plan, and chart completion.    Assessment & Plan:  BHAWANA TKACH is a 52 y.o. female . Prediabetes - Plan: Semaglutide-Weight Management (WEGOVY) 0.25 MG/0.5ML SOAJ  Class 2 severe obesity with serious comorbidity and body mass index (BMI) of 36.0 to 36.9 in adult, unspecified obesity type (HCC) - Plan: Ambulatory referral to General Surgery, Semaglutide-Weight Management (WEGOVY) 0.25 MG/0.5ML SOAJ  LFT elevation - Plan: Hepatic function panel  Screening for thyroid disorder - Plan: TSH  Obesity with prediabetes, unfortunately difficulty with weight loss as above, exercise limited due to foot issues.  Would like to pursue both medical and surgical weight loss options.  Referral to general surgery placed.  Potential medication choices discussed, she is followed by psychiatry and already on Wellbutrin.  GLP-1 discussed and would like to consider that medication.  Potential side effects and risks as well as possible cost discussed.  If that is covered we will start with 0.25 mg weekly,  recheck in 1 month.  Number also provided for healthy weight and wellness, especially if medication not covered.  Repeat LFTs for prior elevated ALT, hepatic steatosis likely.  Check TSH but less likely thyroid cause of weight difficulties.  Meds ordered this encounter  Medications   Semaglutide-Weight Management (WEGOVY) 0.25 MG/0.5ML SOAJ    Sig: Inject 0.25 mg into the skin once a week.    Dispense:  2 mL    Refill:  1   Patient Instructions  I will refer you to surgery to discuss weight loss options and will try to order the [facility]. If cost prohibitive, I would recommend meeting with weight management specialist below. Recheck in 1 month if you do start the med. Borderline liver test last visit - possible fatty liver on prior CT. I will repeat labs, but that should improve with weight loss as well.   [facility]  Weight Loss Management  [phone]     Signed,   Meredith Staggers, MD [facility], [facility] 07/07/22 9:29 AM

## 2022-07-12 ENCOUNTER — Ambulatory Visit (INDEPENDENT_AMBULATORY_CARE_PROVIDER_SITE_OTHER): Payer: 59

## 2022-07-12 ENCOUNTER — Other Ambulatory Visit: Payer: Self-pay | Admitting: Podiatry

## 2022-07-12 ENCOUNTER — Ambulatory Visit (INDEPENDENT_AMBULATORY_CARE_PROVIDER_SITE_OTHER): Payer: 59 | Admitting: Podiatry

## 2022-07-12 ENCOUNTER — Encounter: Payer: Self-pay | Admitting: Podiatry

## 2022-07-12 DIAGNOSIS — M84374D Stress fracture, right foot, subsequent encounter for fracture with routine healing: Secondary | ICD-10-CM

## 2022-07-12 DIAGNOSIS — Z9889 Other specified postprocedural states: Secondary | ICD-10-CM

## 2022-07-12 NOTE — Progress Notes (Signed)
Subjective:   Patient ID: Lauren Chapman, female   DOB: 52 y.o.   MRN: [id number]   HPI Patient states the pain has really subsided but I still have swelling in my forefoot right   ROS      Objective:  Physical Exam  Neurovascular status intact the first MPJ is healing well range of motion good minimal swelling with quite a bit of swelling around the second metatarsal shaft that is painful when pressed to a moderate degree     Assessment:  Stress fracture of the right second metatarsal most likely with history of surgery right first metatarsal doing well     Plan:  H&P reviewed and at this point I have recommended continued compression and elevation and boot usage but reduce it over time.  Patient will be seen back to recheck all questions answered today.  X-rays indicate a moderate stress fracture of the second metatarsal shaft localized

## 2022-07-18 ENCOUNTER — Telehealth (INDEPENDENT_AMBULATORY_CARE_PROVIDER_SITE_OTHER): Payer: 59 | Admitting: Nurse Practitioner

## 2022-07-18 DIAGNOSIS — J4530 Mild persistent asthma, uncomplicated: Secondary | ICD-10-CM

## 2022-07-18 DIAGNOSIS — J4540 Moderate persistent asthma, uncomplicated: Secondary | ICD-10-CM

## 2022-07-18 MED ORDER — ALBUTEROL SULFATE HFA 108 (90 BASE) MCG/ACT IN AERS: 2.0000 | INHALATION_SPRAY | RESPIRATORY_TRACT | 1 refills | Status: AC | PRN

## 2022-07-18 MED ORDER — PREDNISONE 20 MG PO TABS
20.0000 mg | ORAL_TABLET | Freq: Every day | ORAL | 0 refills | Status: AC
Start: 2022-07-18 — End: 2022-07-23

## 2022-07-18 MED ORDER — BUDESONIDE-FORMOTEROL FUMARATE 160-4.5 MCG/ACT IN AERO
2.0000 | INHALATION_SPRAY | Freq: Two times a day (BID) | RESPIRATORY_TRACT | 6 refills | Status: DC
Start: 2022-07-18 — End: 2023-07-12

## 2022-07-18 NOTE — Progress Notes (Signed)
Patient ID: Lauren Chapman, female     DOB: 07-04-1970, 52 y.o.      MRN: [id number]  Chief Complaint  Patient presents with   Follow-up    Still sob, pt states she has been using her inhalers. Pt has been using cpap states everything is good with that     Virtual Visit via Video Note  I connected with Lauren Chapman on 07/26/22 at 10:30 AM EDT by a video enabled telemedicine application and verified that I am speaking with the correct person using two identifiers.  Location: Patient: Home Provider: Office   I discussed the limitations of evaluation and management by telemedicine and the availability of in person appointments. The patient expressed understanding and agreed to proceed.  History of Present Illness: 52 year old female, former smoker (Quit June 2023) followed for asthma.  She is a patient of Dr. Trena Chapman and last seen in office 12/06/2021 by Lauren Chapman. Past medical history significant for bipolar disorder, OSA on CPAP, obesity, migraines, GERD, anxiety.   She was hospitalized for acute hypoxic respiratory failure related to CAP from 07/18/2021 to 07/23/2021.  COVID/flu negative.  She was treated with Rocephin and Zithromax along with IV Solu-Medrol.  She was discharged on supplemental oxygen at 2 to 4 L and prednisone 40 mg for 7 days.  She was also prescribed Symbicort for maintenance and advised to follow-up with pulmonary outpatient.   TEST/EVENTS:  09/09/2018 PFTs: FVC 82, FEV1 84, ratio 82, TLC 102, DLCOcor 88. No BD. Normal PFTs 07/18/2021 CXR: questionable mild lingular pneumonia; clear upper lobes 07/31/2021 CXR 1 view: subtle opacity at lateral left base, similar to prior and likely atelectasis, scarring or resolving pna.  09/05/2021 PFTs: FVC 56, FEV1 58, ratio 82, TLC 83, DLCOcor 76. +21% BD response. Moderate obstruction with reversibility  09/07/2021 LDCT chest: atherosclerosis. Mild emphysema. Small solid nodule in the RUL, benign appearance. Lung RADS 2   08/02/2021:  Lauren Chapman with Lauren Borkowski Chapman for hospital follow up. She reports that she has been slowly recovering since being discharged. She is still feeling a little fatigued and has an occasional congested, non-productive cough; however, both of these are improving. She was able to come off of her oxygen and has been in the 90's on room air. Her breathing feels much better on Symbicort and she hasn't had to use her albuterol but 2-3 times since being discharged. Prior to admission, she was using it almost daily. She denies any fevers, night sweats, hemoptysis, anorexia, weight loss, wheezing. She has quit smoking and is currently using nicotine patches provided by her insurance to help her remain smoke free. She is taking mucinex for chest congestion/cough and using her Symbicort Twice daily.  PFTs ordered for further evaluation.  Referred to lung cancer screening program. She did go back to the ED on 7/9 after a syncopal episode. She had developed some joint pain and headache prior to the event. Respiratory status was stable with O2 98% on room air and ED workup was unremarkable. Suspect related to orthostatic or vasovagal syncope and possible from dehydration - treated with IV fluids and headache cocktail and discharged home. She reports feeling better since with no recurrent episodes. She has follow up scheduled with her PCP on Friday.  She is experiencing some itching and discomfort in her vaginal area and is concerned she may have developed a yeast infection from the antibiotics she was on. She has had a small amount of discharge. No odor, bleeding or pain. No  urinary symptoms.  Treated with fluconazole x1 and instructed to follow-up with PCP if symptoms do not improve.   09/05/2021: OV with Lauren Chapman for follow-up after undergoing pulmonary function testing.  She reports that she has been doing well since she was seen last.  Feels like all of her symptoms have resolved.  She no longer has a cough and the chest congestion has gone  away.  Breathing feels like it is relatively stable.  She did stop using Symbicort and is using albuterol every other day as she still gets winded with some activities.  Does not notice any significant wheezing.  Does also occasionally have some chest tightness with the shortness of breath.  Denies any lower extremity swelling, hemoptysis, fevers, night sweats, palpitations.  She has upcoming CT chest in 2 days for lung cancer screening.  No concerns or complaints today.    12/06/2021: Ov with Lauren Powley Chapman for follow up. She has been doing well since she was here last. Feels like she is better since starting on the Symbicort consistently. Activity tolerance has improved. No significant cough or chest congestion. Hasn't noticed wheezing. She has been able to decrease frequency of albuterol; previously was using it multiple times a day. Since starting Symbicort, she's down to using it once a week, at max. No concerns or complaints today.    06/06/2022: OV with Lauren Drummonds Chapman for follow up. She has been doing well from a breathing standpoint since she was here last. She rarely has to use her rescue inhaler, maybe 3 times a month. She has not had any abx or steroids for her breathing. No significant cough, chest congestion, wheezing. She does have some trouble with anxiety, especially if she has an chest tightness. Feels like it might be some PTSD related to her previous hospitalization. She does see a Veterinary surgeon and psychiatrist. She is on Abilify, celexa, wellbutrin, lamictal.   07/18/2022: Today - follow up Patient presents today for follow up. At our last visit, we de-escalated her inhaler to Symbicort 80 mcg from 160 mcg. She tells me that she has been feeling more short winded when she goes to do things, especially over the last few weeks. She does get some chest tightness with this. It has also woken her up at night. She has an occasional wheeze. She denies any cough, fevers, chills, hemoptysis, leg swelling, orthopnea,  palpitations, dizziness.  Allergies  Allergen Reactions   Codeine Anaphylaxis and Other (See Comments)    Cannot have ANYTHING with codeine   Immunization History  Administered Date(s) Administered   Influenza Inj Mdck Quad Pf 01/03/2019   Influenza Split 10/11/2011   Influenza,inj,Quad PF,6+ Mos 12/04/2017, 12/01/2019, 11/07/2021   Influenza-Unspecified 01/03/2019   PFIZER(Purple Top)SARS-COV-2 Vaccination 04/19/2019, 05/10/2019, 01/22/2020, 10/26/2020, 11/07/2021   PNEUMOCOCCAL CONJUGATE-20 08/02/2020   Pfizer Covid-19 Vaccine Bivalent Booster 78yrs & up 05/15/2020   Tdap 10/16/2018   Zoster Recombinant(Shingrix) 05/31/2020, 08/02/2020   Past Medical History:  Diagnosis Date   Allergy    Anxiety    Bipolar affective psychosis (HCC)    Complication of anesthesia    " Acting silly- waving at everyone"-Giddy   Depression    Emphysema lung (HCC)    inhaler prn,  followed by pcp and pulmonologist-- dr [name]   Family history of adverse reaction to anesthesia    Father had rheumatic fever, and died on operating table.   GERD (gastroesophageal reflux disease)    Kidney cysts 01/24/2020   Right Kidney   Migraine  Moderate persistent asthma 09/05/2021   Smokers' cough (HCC)    per pt not productive   SUI (stress urinary incontinence, female)    Tubular adenoma of colon 07/08/2020   Wears glasses     Tobacco History: Social History   Tobacco Use  Smoking Status Former   Packs/day: 1.00   Years: 39.00   Additional pack years: 0.00   Total pack years: 39.00   Types: Cigarettes   Start date: 10/10/1981   Quit date: 07/18/2021   Years since quitting: 1.0   Passive exposure: Never  Smokeless Tobacco Never   Counseling given: Not Answered   Outpatient Medications Prior to Visit  Medication Sig Dispense Refill   ARIPiprazole (ABILIFY) 30 MG tablet Take 30 mg by mouth at bedtime.     benztropine (COGENTIN) 0.5 MG tablet Take by mouth.     buPROPion (WELLBUTRIN XL)  150 MG 24 hr tablet Take 150 mg by mouth every morning.     busPIRone (BUSPAR) 15 MG tablet Take 15 mg by mouth 3 (three) times daily.     citalopram (CELEXA) 20 MG tablet Take 30 mg by mouth daily.     diclofenac (VOLTAREN) 75 MG EC tablet Take 1 tablet (75 mg total) by mouth 2 (two) times daily. 50 tablet 2   EPINEPHrine 0.3 mg/0.3 mL IJ SOAJ injection Inject 0.3 mg into the muscle as needed for anaphylaxis. 1 each 0   esomeprazole (NEXIUM) 20 MG capsule Take 1 capsule (20 mg total) by mouth 2 (two) times daily before a meal. 60 capsule 1   gabapentin (NEURONTIN) 800 MG tablet Take 800 mg by mouth 3 (three) times daily.      lamoTRIgine (LAMICTAL) 200 MG tablet Take 200 mg by mouth 2 (two) times daily.     MYRBETRIQ 50 MG TB24 tablet Take 50 mg by mouth daily.     ondansetron (ZOFRAN) 4 MG tablet Take 1 tablet (4 mg total) by mouth every 8 (eight) hours as needed for nausea or vomiting. 20 tablet 6   Semaglutide-Weight Management (WEGOVY) 0.25 MG/0.5ML SOAJ Inject 0.25 mg into the skin once a week. 2 mL 1   SUMAtriptan (IMITREX) 6 MG/0.5ML SOLN injection Inject 0.5 mLs (6 mg total) into the skin See admin instructions. INJECT 6 MG (0.5 ml) INTO THE SKIN AS NEEDED FOR MIGRAINE OR HEADACHE. MAY REPEAT ONCE IN 2 HOURS IF HEADACHE PERSISTS OR RECURS. MAX 2 DOSES IN 24 HOURS. 3 mL 5   thiothixene (NAVANE) 5 MG capsule Take 5 mg by mouth 3 (three) times daily.     tiZANidine (ZANAFLEX) 4 MG tablet Take 1 tablet (4 mg total) by mouth every 6 (six) hours as needed for muscle spasms. 30 tablet 5   budesonide-formoterol (SYMBICORT) 80-4.5 MCG/ACT inhaler Inhale 2 puffs into the lungs in the morning and at bedtime. 1 each 5   albuterol (VENTOLIN HFA) 108 (90 Base) MCG/ACT inhaler Inhale 2 puffs into the lungs every 4 (four) hours as needed for wheezing or shortness of breath. (Patient taking differently: Inhale 2 puffs into the lungs every hour as needed for wheezing or shortness of breath.) 1 each 1   No  facility-administered medications prior to visit.     Review of Systems:   Constitutional: No weight loss or gain, night sweats, fevers, chills, fatigue, or lassitude. HEENT: No headaches, difficulty swallowing, tooth/dental problems, or sore throat. No sneezing, itching, ear ache, nasal congestion, or post nasal drip CV:  No chest pain, orthopnea, swelling in  lower extremities, anasarca, dizziness, palpitations, syncope Resp: +shortness of breath with exertion; occasional wheeze; chest tightness. No productive or non-productive. No hemoptysis. No chest wall deformity GI:  No heartburn, indigestion  Skin: No rash, lesions, ulcerations MSK:  No joint pain or swelling.   Neuro: No dizziness or lightheadedness.  Psych: No depression or anxiety. Mood stable.   Observations/Objective: Patient is well-developed, well-nourished in no acute distress. A&Ox3. Resting comfortably at home. Unlabored breathing. Speech is clear and coherent with logical content.   Assessment and Plan: Poorly controlled mild persistent asthma Poorly controlled since de-escalating to lower dose ICS. Question possible mild exacerbation. Will treat her with low dose prednisone burst - 20 mg x 5 days. We will step her back up to 160 mcg Symbicort. Medication education provided. Action plan in place. Advised to call if symptoms do not improve for sooner follow up.  Patient Instructions  Continue Albuterol inhaler 2 puffs or 3 mL neb every 6 hours as needed for shortness of breath or wheezing. Notify if symptoms persist despite rescue inhaler/neb use. Continue nexium 20 mg Twice daily  Step back up to Symbicort 160 mcg 2 puffs Twice daily. Brush tongue and rinse mouth afterwards  Prednisone 20 mg daily for 5 days. Take in AM with food   Follow up in 6 weeks with Dr Wynona Neat or Philis Nettle. If symptoms do not improve or worsen, please contact office for sooner follow up or seek emergency care.    I discussed the  assessment and treatment plan with the patient. The patient was provided an opportunity to ask questions and all were answered. The patient agreed with the plan and demonstrated an understanding of the instructions.   The patient was advised to call back or seek an in-person evaluation if the symptoms worsen or if the condition fails to improve as anticipated.  I provided 32 minutes of non-face-to-face time during this encounter.   Noemi Chapel, Chapman

## 2022-07-19 ENCOUNTER — Ambulatory Visit
Admission: EM | Admit: 2022-07-19 | Discharge: 2022-07-19 | Disposition: A | Discharge status: Home / Self Care | Payer: 59 | Attending: Family Medicine | Admitting: Family Medicine

## 2022-07-19 DIAGNOSIS — H9202 Otalgia, left ear: Secondary | ICD-10-CM | POA: Diagnosis not present

## 2022-07-19 MED ORDER — AMOXICILLIN 875 MG PO TABS: 875.0000 mg | ORAL_TABLET | Freq: Two times a day (BID) | ORAL | 0 refills | Status: AC

## 2022-07-19 NOTE — ED Triage Notes (Signed)
Pt reports she has an  left side ear ache, jaw pain, and fever x 1 day. Took tylenol which helped with fever.

## 2022-07-19 NOTE — ED Provider Notes (Signed)
[facility]   [id number] 07/19/22 Arrival Time: 0930  ASSESSMENT & PLAN:  1. Otalgia, left ear    Suspect developing OM. Begin: Meds ordered this encounter  Medications   amoxicillin (AMOXIL) 875 MG tablet    Sig: Take 1 tablet (875 mg total) by mouth 2 (two) times daily for 7 days.    Dispense:  14 tablet    Refill:  0   Work note provided. OTC symptom care as needed. Ensure adequate fluid intake and rest. May f/u with PCP or here as needed.  Reviewed expectations re: course of current medical issues. Questions answered. Outlined signs and symptoms indicating need for more acute intervention. Patient verbalized understanding. After Visit Summary given.   SUBJECTIVE: History from: patient.  Lauren Chapman is a 52 y.o. female who presents with complaint of left side ear ache, jaw pain, and fever x 1 day. Took tylenol which helped with fever. Mild congestion.  Social History   Tobacco Use  Smoking Status Former   Packs/day: 1.00   Years: 39.00   Additional pack years: 0.00   Total pack years: 39.00   Types: Cigarettes   Start date: 10/10/1981   Quit date: 07/18/2021   Years since quitting: 1.0   Passive exposure: Never  Smokeless Tobacco Never    OBJECTIVE:  Vitals:   07/19/22 0940  BP: 106/61  Pulse: 80  Resp: 18  Temp: 97.9 F (36.6 C)  TempSrc: Oral  SpO2: 95%    General appearance: alert; appears fatigued Ear Canal: normal TM: left: erythematous, dull, bulging Neck: supple without LAD Lungs: unlabored respirations, symmetrical air entry; cough: absent; no respiratory distress Skin: warm and dry Psychological: alert and cooperative; normal mood and affect  Allergies  Allergen Reactions   Codeine Anaphylaxis and Other (See Comments)    Cannot have ANYTHING with codeine    Past Medical History:  Diagnosis Date   Allergy    Anxiety    Bipolar affective psychosis (HCC)    Complication of anesthesia    " Acting silly- waving at  everyone"-Giddy   Depression    Emphysema lung (HCC)    inhaler prn,  followed by pcp and pulmonologist-- dr [name]   Family history of adverse reaction to anesthesia    Father had rheumatic fever, and died on operating table.   GERD (gastroesophageal reflux disease)    Kidney cysts 01/24/2020   Right Kidney   Migraine    Moderate persistent asthma 09/05/2021   Smokers' cough (HCC)    per pt not productive   SUI (stress urinary incontinence, female)    Tubular adenoma of colon 07/08/2020   Wears glasses    Family History  Problem Relation Age of Onset   Depression Mother    Cancer Mother        Cervical   Alcohol abuse Father    Crohn's disease Sister    Stroke Brother 34   Migraines Daughter    GER disease Daughter    Depression Daughter    Migraines Daughter    GER disease Daughter    Colon cancer Neg Hx    Rectal cancer Neg Hx    Stomach cancer Neg Hx    Social History   Socioeconomic History   Marital status: Divorced    Spouse name: n/a   Number of children: 2   Years of education: 18   Highest education level: Not on file  Occupational History   Occupation: Disabled    Comment: Formerly  a Runner, broadcasting/film/video.  Tobacco Use   Smoking status: Former    Packs/day: 1.00    Years: 39.00    Additional pack years: 0.00    Total pack years: 39.00    Types: Cigarettes    Start date: 10/10/1981    Quit date: 07/18/2021    Years since quitting: 1.0    Passive exposure: Never   Smokeless tobacco: Never  Vaping Use   Vaping Use: Never used  Substance and Sexual Activity   Alcohol use: Never    Alcohol/week: 0.0 standard drinks of alcohol   Drug use: Never   Sexual activity: Not Currently    Partners: Male    Birth control/protection: Post-menopausal    Comment: 1st intercourse 52 yo-More than 5 partners  Other Topics Concern   Not on file  Social History Narrative   Lives at home with 1 of her two daughters   Right-handed.   2-4 cups caffeine daily.   Disabled     One story home   Social Determinants of Health   Financial Resource Strain: Low Risk  (11/02/2021)   Overall Financial Resource Strain (CARDIA)    Difficulty of Paying Living Expenses: Not hard at all  Food Insecurity: Food Insecurity Present (11/10/2021)   Hunger Vital Sign    Worried About Running Out of Food in the Last Year: Sometimes true    Ran Out of Food in the Last Year: Sometimes true  Transportation Needs: No Transportation Needs (11/02/2021)   PRAPARE - Administrator, Civil Service (Medical): No    Lack of Transportation (Non-Medical): No  Physical Activity: Insufficiently Active (11/02/2021)   Exercise Vital Sign    Days of Exercise per Week: 3 days    Minutes of Exercise per Session: 30 min  Stress: No Stress Concern Present (11/02/2021)   [employer] of Occupational Health - Occupational Stress Questionnaire    Feeling of Stress : Not at all  Social Connections: Moderately Isolated (11/02/2021)   Social Connection and Isolation Panel [NHANES]    Frequency of Communication with Friends and Family: More than three times a week    Frequency of Social Gatherings with Friends and Family: More than three times a week    Attends Religious Services: More than 4 times per year    Active Member of [employer] or Organizations: No    Attends Banker Meetings: Never    Marital Status: Divorced  Catering manager Violence: Not At Risk (11/02/2021)   Humiliation, Afraid, Rape, and Kick questionnaire    Fear of Current or Ex-Partner: No    Emotionally Abused: No    Physically Abused: No    Sexually Abused: No             Mardella Layman, MD 07/19/22 1018

## 2022-07-26 ENCOUNTER — Ambulatory Visit: Payer: 59 | Admitting: Podiatry

## 2022-07-26 ENCOUNTER — Encounter: Payer: Self-pay | Admitting: Nurse Practitioner

## 2022-07-26 NOTE — Assessment & Plan Note (Signed)
Poorly controlled since de-escalating to lower dose ICS. Question possible mild exacerbation. Will treat her with low dose prednisone burst - 20 mg x 5 days. We will step her back up to 160 mcg Symbicort. Medication education provided. Action plan in place. Advised to call if symptoms do not improve for sooner follow up.  Patient Instructions  Continue Albuterol inhaler 2 puffs or 3 mL neb every 6 hours as needed for shortness of breath or wheezing. Notify if symptoms persist despite rescue inhaler/neb use. Continue nexium 20 mg Twice daily  Step back up to Symbicort 160 mcg 2 puffs Twice daily. Brush tongue and rinse mouth afterwards  Prednisone 20 mg daily for 5 days. Take in AM with food   Follow up in 6 weeks with Dr Wynona Neat or Philis Nettle. If symptoms do not improve or worsen, please contact office for sooner follow up or seek emergency care.

## 2022-07-26 NOTE — Patient Instructions (Addendum)
Continue Albuterol inhaler 2 puffs or 3 mL neb every 6 hours as needed for shortness of breath or wheezing. Notify if symptoms persist despite rescue inhaler/neb use. Continue nexium 20 mg Twice daily  Step back up to Symbicort 160 mcg 2 puffs Twice daily. Brush tongue and rinse mouth afterwards  Prednisone 20 mg daily for 5 days. Take in AM with food   Follow up in 6 weeks with Dr Wynona Neat or Philis Nettle. If symptoms do not improve or worsen, please contact office for sooner follow up or seek emergency care.

## 2022-08-07 ENCOUNTER — Ambulatory Visit: Payer: 59 | Admitting: Family Medicine

## 2022-08-18 ENCOUNTER — Ambulatory Visit (INDEPENDENT_AMBULATORY_CARE_PROVIDER_SITE_OTHER): Payer: 59 | Admitting: Family Medicine

## 2022-08-18 ENCOUNTER — Encounter: Payer: Self-pay | Admitting: Family Medicine

## 2022-08-18 VITALS — BP 132/74 | HR 78 | Temp 98.3°F | Ht 63.0 in | Wt 209.0 lb

## 2022-08-18 DIAGNOSIS — F418 Other specified anxiety disorders: Secondary | ICD-10-CM | POA: Diagnosis not present

## 2022-08-18 DIAGNOSIS — Z6836 Body mass index (BMI) 36.0-36.9, adult: Secondary | ICD-10-CM | POA: Diagnosis not present

## 2022-08-18 NOTE — Patient Instructions (Addendum)
I will work on the paperwork for [employer] surgery.  I am so sorry you are going through a difficult time right now with your daughter's situation.  I do recommend reaching out to Dr. Reddy's office and your therapist to assist during this difficult time and to discuss if any other new medicines or change in your current medications.  Work on the coping techniques that have worked for you previously as we discussed in the office including getting out for a walk, journaling, reading and 5 senses.  Hang in there and let me know if I can help further.

## 2022-08-18 NOTE — Progress Notes (Signed)
Subjective:  Patient ID: GLYNNIS QUALLS, female    DOB: Dec 16, 1970  Age: 52 y.o. MRN: [id number]  CC:  Chief Complaint  Patient presents with   Medical Clearance    Pt here for surgical clearance for weight loss surgery.    Anxiety    Pt is having high nerves the last few days due to situation with her daughter trying to get back to the country and asked if there was anything we can do for this     HPI GENISES SCADUTO presents for   As above, evaluation for surgery as well as anxiety.  Preoperative evaluation: History of obesity, difficulty with weight loss discussed at her June visit.  Medication and surgical options were discussed at that time.  Initially ordered [facility], and referred to surgeon to evaluate for surgical treatment options. Wegovy prescribed- denied by insurance. Surgery will be approved if other coexistent conditions.  Paperwork received from [facility].  Requesting comorbidities, weight loss attempts, and letter of necessity. Will have appointment after paperwork completed.  Prior attempts: Weight Watchers - about 5 years ago, some initial weight loss, then regained weight.  Slim Fast - off and on for 15 years, no weight loss - gained weight.  Decreased caloric intake - 2 years ago, 2 months - lost 60#, then regained weight, with continued increase weight - now at heaviest.  Walking daily for few years 30 min - no changes.  Nutritionist - past 1 year, few visits - working on portion control and food choices, no weight changes with healthier eating and smaller portions.  On wellbutrin from psychiatry for other conditions - past 6 months, no improvement. Reginal Lutes recently ordered - not covered.  Wt Readings from Last 3 Encounters:  08/18/22 209 lb (94.8 kg)  07/07/22 206 lb 12.8 oz (93.8 kg)  06/26/22 208 lb 6.4 oz (94.5 kg)  Co morbidities of : OSA on CPAP.  Hepatic steatosis - noted on 09/08/21.  Prediabetes.  Lab Results  Component Value Date    HGBA1C 5.8 05/10/2022   Situational anxiety History of bipolar disorder, anxiety treated with Abilify, Lamictal, Celexa, benztropine, gabapentin, buspar, Wellbutrin, under the care of psychiatry, Dr. Betti Cruz.  Counselor [employer].  Stable symptoms were discussed in April.  Some increased anxiety recently with her daughter who is having difficulty getting back in the country. She is being held [place] by husband in [place]. Has been trying to work with [employer], but no ambassador in [place]. She plans to fly to [place], with her child. Trying to get flight tomorrow.  Controlled substance database reviewed, gabapentin, tramadol noted from July and May.  Gabapentin from Dr. Betti Cruz. Has not discussed with her psychiatrist or therapist. No as needed meds at this time. Feeling very anxious, shaky, thinking of worst scenarios. Unable to help from a distance. Feels helpless. No SI.  Walking, journaling, reading, 5 senses has been helpful for coping in past.    History Patient Active Problem List   Diagnosis Date Noted   Poorly controlled mild persistent asthma 09/05/2021   Syncope 08/02/2021   Tobacco abuse 08/02/2021   Vaginal candidiasis 08/02/2021   CAP (community acquired pneumonia) 07/18/2021   OSA (obstructive sleep apnea) 07/18/2021   Hypokalemia 07/18/2021   Depression 03/24/2020   Bilateral low back pain with left-sided sciatica 03/24/2020   Left lumbar radiculopathy 02/25/2020   Absence of bladder continence 02/25/2020   History of postmenopausal bleeding 04/29/2019   Polypharmacy 04/03/2019   Emphysema, unspecified (HCC)  05/17/2018   Weakness 01/25/2017   Chronic migraine w/o aura w/o status migrainosus, not intractable 12/22/2014   Encounter for other general counseling or advice on contraception 09/02/2014   Bipolar disorder (HCC) 10/12/2011   Anxiety 10/12/2011   GERD (gastroesophageal reflux disease) 10/12/2011   Past Medical History:  Diagnosis Date   Allergy    Anxiety     Bipolar affective psychosis (HCC)    Complication of anesthesia    " Acting silly- waving at everyone"-Giddy   Depression    Emphysema lung (HCC)    inhaler prn,  followed by pcp and pulmonologist-- dr [name]   Family history of adverse reaction to anesthesia    Father had rheumatic fever, and died on operating table.   GERD (gastroesophageal reflux disease)    Kidney cysts 01/24/2020   Right Kidney   Migraine    Moderate persistent asthma 09/05/2021   Smokers' cough (HCC)    per pt not productive   SUI (stress urinary incontinence, female)    Tubular adenoma of colon 07/08/2020   Wears glasses    Past Surgical History:  Procedure Laterality Date   ABDOMINAL HYSTERECTOMY N/A    Phreesia 11/30/2019   CARPAL TUNNEL RELEASE Right 09-12-2007   @[facility]    and GANGLION CYST EXCISION   COLONOSCOPY     CYSTOSCOPY N/A 04/29/2019   Procedure: CYSTOSCOPY;  Surgeon: [name], MD;  Location[facility];  Service: Gynecology;  Laterality: N/A;   DILATATION & CURETTAGE/HYSTEROSCOPY WITH MYOSURE N/A 11/13/2018   Procedure: DILATATION & CURETTAGE/HYSTEROSCOPY WITH MYOSURE;  Surgeon: [name], MD;  Location: [facility];  Service: Gynecology;  Laterality: N/A;  request 9:00am OR start time in [place] [facility] block requests one hour   DILATION AND CURETTAGE OF UTERUS  02/2019   EYE SURGERY N/A    Phreesia 11/30/2019   GANGLION CYST EXCISION     R wrist   interstem      implant for overactive bladder   KNEE ARTHROSCOPY WITH ANTERIOR CRUCIATE LIGAMENT (ACL) REPAIR Right 10/2015   LAPAROSCOPIC CHOLECYSTECTOMY  1997   LAPAROSCOPIC HYSTERECTOMY Bilateral 04/29/2019   Procedure: HYSTERECTOMY TOTAL LAPAROSCOPIC, BILATERAL SALPINO-OOPHORECTOMY;  Surgeon: [name], MD;  Location: [facility];  Service: Gynecology;  Laterality: Bilateral;   LAPAROSCOPIC TOTAL HYSTERECTOMY     TOOTH EXTRACTION  04/01/2019   tooth removal Bilateral  2022   4 teeth removed   UPPER GASTROINTESTINAL ENDOSCOPY     Allergies  Allergen Reactions   Codeine Anaphylaxis and Other (See Comments)    Cannot have ANYTHING with codeine   Prior to Admission medications   Medication Sig Start Date End Date Taking? Authorizing Provider  albuterol (VENTOLIN HFA) 108 (90 Base) MCG/ACT inhaler Inhale 2 puffs into the lungs every 4 (four) hours as needed for wheezing or shortness of breath. 07/18/22  Yes Cobb, Ruby Cola, NP  ARIPiprazole (ABILIFY) 30 MG tablet Take 30 mg by mouth at bedtime. 12/23/20  Yes [provider]  benztropine (COGENTIN) 0.5 MG tablet Take by mouth. 12/07/21  Yes [provider]  budesonide-formoterol (SYMBICORT) 160-4.5 MCG/ACT inhaler Inhale 2 puffs into the lungs 2 (two) times daily. 07/18/22  Yes Cobb, Ruby Cola, NP  buPROPion (WELLBUTRIN XL) 150 MG 24 hr tablet Take 150 mg by mouth every morning. 04/06/22  Yes [provider]  busPIRone (BUSPAR) 15 MG tablet Take 15 mg by mouth 3 (three) times daily. 07/30/20  Yes [provider]  citalopram (CELEXA) 20 MG  tablet Take 30 mg by mouth daily.   Yes [provider]  diclofenac (VOLTAREN) 75 MG EC tablet Take 1 tablet (75 mg total) by mouth 2 (two) times daily. 03/29/22  Yes Regal, Kirstie Peri, DPM  EPINEPHrine 0.3 mg/0.3 mL IJ SOAJ injection Inject 0.3 mg into the muscle as needed for anaphylaxis. 03/29/21  Yes Al Decant, PA-C  esomeprazole (NEXIUM) 20 MG capsule Take 1 capsule (20 mg total) by mouth 2 (two) times daily before a meal. 03/21/19  Yes Lezlie Lye, Meda Coffee, MD  gabapentin (NEURONTIN) 800 MG tablet Take 800 mg by mouth 3 (three) times daily.  07/02/18  Yes [provider]  lamoTRIgine (LAMICTAL) 200 MG tablet Take 200 mg by mouth 2 (two) times daily. 12/20/21  Yes [provider]  MYRBETRIQ 50 MG TB24 tablet Take 50 mg by mouth daily. 11/11/21  Yes [provider]  ondansetron (ZOFRAN) 4 MG tablet  Take 1 tablet (4 mg total) by mouth every 8 (eight) hours as needed for nausea or vomiting. 01/05/20  Yes Glean Salvo, NP  Semaglutide-Weight Management (WEGOVY) 0.25 MG/0.5ML SOAJ Inject 0.25 mg into the skin once a week. 07/07/22  Yes Shade Flood, MD  SUMAtriptan (IMITREX) 6 MG/0.5ML SOLN injection Inject 0.5 mLs (6 mg total) into the skin See admin instructions. INJECT 6 MG (0.5 ml) INTO THE SKIN AS NEEDED FOR MIGRAINE OR HEADACHE. MAY REPEAT ONCE IN 2 HOURS IF HEADACHE PERSISTS OR RECURS. MAX 2 DOSES IN 24 HOURS. 02/17/22  Yes Everlena Cooper, Adam R, DO  thiothixene (NAVANE) 5 MG capsule Take 5 mg by mouth 3 (three) times daily. 04/07/22  Yes [provider]  tiZANidine (ZANAFLEX) 4 MG tablet Take 1 tablet (4 mg total) by mouth every 6 (six) hours as needed for muscle spasms. 08/15/21  Yes Drema Dallas, DO   Social History   Socioeconomic History   Marital status: Divorced    Spouse name: n/a   Number of children: 2   Years of education: 18   Highest education level: Not on file  Occupational History   Occupation: Disabled    Comment: Formerly a Runner, broadcasting/film/video.  Tobacco Use   Smoking status: Former    Current packs/day: 0.00    Average packs/day: 1 pack/day for 39.8 years (39.8 ttl pk-yrs)    Types: Cigarettes    Start date: 10/10/1981    Quit date: 07/18/2021    Years since quitting: 1.0    Passive exposure: Never   Smokeless tobacco: Never  Vaping Use   Vaping status: Never Used  Substance and Sexual Activity   Alcohol use: Never    Alcohol/week: 0.0 standard drinks of alcohol   Drug use: Never   Sexual activity: Not Currently    Partners: Male    Birth control/protection: Post-menopausal    Comment: 1st intercourse 52 yo-More than 5 partners  Other Topics Concern   Not on file  Social History Narrative   Lives at home with 1 of her two daughters   Right-handed.   2-4 cups caffeine daily.   Disabled    One story home   Social Determinants of Health   Financial  Resource Strain: Low Risk  (11/02/2021)   Overall Financial Resource Strain (CARDIA)    Difficulty of Paying Living Expenses: Not hard at all  Food Insecurity: Food Insecurity Present (11/10/2021)   Hunger Vital Sign    Worried About Running Out of Food in the Last Year: Sometimes true  Ran Out of Food in the Last Year: Sometimes true  Transportation Needs: No Transportation Needs (11/02/2021)   PRAPARE - Administrator, Civil Service (Medical): No    Lack of Transportation (Non-Medical): No  Physical Activity: Insufficiently Active (11/02/2021)   Exercise Vital Sign    Days of Exercise per Week: 3 days    Minutes of Exercise per Session: 30 min  Stress: No Stress Concern Present (11/02/2021)   [employer] of Occupational Health - Occupational Stress Questionnaire    Feeling of Stress : Not at all  Social Connections: Moderately Isolated (11/02/2021)   Social Connection and Isolation Panel [NHANES]    Frequency of Communication with Friends and Family: More than three times a week    Frequency of Social Gatherings with Friends and Family: More than three times a week    Attends Religious Services: More than 4 times per year    Active Member of [employer] or Organizations: No    Attends Banker Meetings: Never    Marital Status: Divorced  Catering manager Violence: Not At Risk (11/02/2021)   Humiliation, Afraid, Rape, and Kick questionnaire    Fear of Current or Ex-Partner: No    Emotionally Abused: No    Physically Abused: No    Sexually Abused: No    Review of Systems   Objective:   Vitals:   08/18/22 1023  BP: 132/74  Pulse: 78  Temp: 98.3 F (36.8 C)  TempSrc: Temporal  SpO2: 98%  Weight: 209 lb (94.8 kg)  Height: 5\' 3"  (1.6 m)     Physical Exam Constitutional:      General: She is not in acute distress.    Appearance: Normal appearance. She is well-developed.  HENT:     Head: Normocephalic and atraumatic.  Cardiovascular:      Rate and Rhythm: Normal rate.  Pulmonary:     Effort: Pulmonary effort is normal.  Neurological:     Mental Status: She is alert and oriented to person, place, and time.  Psychiatric:        Mood and Affect: Mood normal.     Comments: Tearful at times during visit but overall appropriate interaction, appropriate responses, good eye contact, does not appear to be responding to internal stimuli.  No SI.      38 minutes spent during visit, including chart review, counseling, discussion of stressors and use of her coping techniques, plan with psychiatry/therapist, assimilation of information, exam, discussion of plan, and chart completion.    Assessment & Plan:  ALYSIANA DEVILLIER is a 52 y.o. female . Class 2 severe obesity with serious comorbidity and body mass index (BMI) of 36.0 to 36.9 in adult, unspecified obesity type (HCC)  -Unfortunately has had minimal change in weight with multiple attempts at weight loss previously as above.  Now planning to meet with surgeon regarding surgical weight loss options.  I will review paperwork and complete letter regarding her comorbidities and previous attempts.  Situational anxiety  -Understandable anxiety with concern about her daughter's welfare overseas.  We discussed her previous effective coping techniques during this time and recommended she reach out to her therapist and psychiatrist for further recommendations or any additional medication recommendations.  Advised to let me know if I can help further.  No orders of the defined types were placed in this encounter.  Patient Instructions  I will work on the paperwork for [employer] surgery.  I am so sorry you are going through a  difficult time right now with your daughter's situation.  I do recommend reaching out to Dr. Reddy's office and your therapist to assist during this difficult time and to discuss if any other new medicines or change in your current medications.  Work on the coping  techniques that have worked for you previously as we discussed in the office including getting out for a walk, journaling, reading and 5 senses.  Hang in there and let me know if I can help further.    Signed,   Meredith Staggers, MD [facility], [facility] 08/18/22 11:08 AM

## 2022-08-24 ENCOUNTER — Other Ambulatory Visit (HOSPITAL_COMMUNITY): Payer: Self-pay | Admitting: Surgery

## 2022-08-25 ENCOUNTER — Encounter: Payer: Self-pay | Admitting: Family Medicine

## 2022-08-28 ENCOUNTER — Encounter: Payer: Self-pay | Admitting: Dietician

## 2022-08-28 ENCOUNTER — Encounter: Payer: 59 | Attending: Surgery | Admitting: Dietician

## 2022-08-28 VITALS — Ht 63.0 in | Wt 212.7 lb

## 2022-08-28 DIAGNOSIS — Z713 Dietary counseling and surveillance: Secondary | ICD-10-CM | POA: Insufficient documentation

## 2022-08-28 DIAGNOSIS — Z6837 Body mass index (BMI) 37.0-37.9, adult: Secondary | ICD-10-CM | POA: Insufficient documentation

## 2022-08-28 DIAGNOSIS — E669 Obesity, unspecified: Secondary | ICD-10-CM | POA: Diagnosis present

## 2022-08-28 NOTE — Progress Notes (Signed)
Nutrition Assessment for Bariatric Surgery: Pre-Surgery Behavioral and Nutrition Intervention Program   Medical Nutrition Therapy  Appt Start Time: 3:52    End Time: 5:15  Patient was seen on 08/28/2022 for Pre-Operative Nutrition Assessment. Purpose of todays visit  enhance perioperative outcomes along with a healthy weight maintenance   Referral stated Supervised Weight Loss (SWL) visits needed: 4 months  Planned surgery: Sleeve Gastrectomy Pt expectation of surgery: To about 135 lbs   NUTRITION ASSESSMENT   Anthropometrics  Start weight at [facility]: 212.7 lbs (date: 08/28/2022)  Height: 63 in BMI: 37.68 kg/m2     Clinical Medical Hx: allergies, anxiety, asthma, COPD, depression, GERD, prediabetes Medications: reviewed Labs: 11/07/21: A1C 6.1% Notable Signs/Symptoms: stomach pain, acid reflux. Food Allergies: none  Evaluation of Nutritional Deficiencies: Micronutrient Nutrition Focused Physical Exam: Hair: No issues observed Eyes: No issues observed Mouth: No issues observed Neck: No issues observed Nails: No issues observed Skin: No issues observed  Lifestyle & Dietary Hx  Pt states she was hospitalized for her mental health, stating she learned a lot about mindfulness to reduce stress. Pt states she was 130 lbs about 3 years ago from losing weight. Pt states she was able to lose weight by skipping meals, stating "by not eating". Pt states she sleep walks during the middle of the night, stating she will eat while sleep walking and not know that she has. Pt states it happens every night. Pt states her mother see evidence of her eating, like cereal on the counter, muffin missing, yogurt cup in the trash can.  Current Physical Activity Recommendations state 150 minutes per week of moderate to vigorous movement including Cardio and 1-2 days of resistance activities as well as flexibility/balance activities:  Pts current physical activity: walking, 3-4 days a week, 25-30 minutes  at a time, with 66-75% recommendation reached   Sleep Hygiene: duration and quality: not good, stating she sleep walks and has sleep apnea. Pt states she uses a CPAP machine, stating she gets about 7 hours a night. Pt states she will eat while sleep walking and not know that she has. Pt states it happens every night. Pt states her mother see evidence of her eating, like cereal on the counter, muffin missing, yogurt cup in the trash can.  Current Patient Perceived Stress Level as stated by pt on a scale of 1-10: 5       Stress Management Techniques: Pt states she was hospitalized for her mental health, stating she learned a lot about mindfulness to reduce stress, and be in the moment without worrying about other things.  According to the Dietary Guidelines for Americans Recommendation: equivalent 1.5-2 cups fruits per day, equivalent 2-3 cups vegetables per day and at least half all grains whole  Fruit servings per day (on average): 0, meeting 0% recommendation  Non-starchy vegetable servings per day (on average): 2, meeting 66-100% recommendation  Whole Grains per day (on average): 1  Number of meals missed/skipped per week out of 21: 15  24-Hr Dietary Recall First Meal: oatmeal or toast with peanut butter or skip Snack: Triscuit Second Meal: skip Snack: Triscuit Third Meal: grilled chicken, vegetables Snack: ice cream Beverages: Gatorade, cirkl bottle, coffee, un-sweet ice tea  Alcoholic beverages per week: 0   Estimated Energy Needs Calories: 1500   NUTRITION DIAGNOSIS  Overweight/obesity ([place]-3.3) related to past poor dietary habits and physical inactivity as evidenced by patient w/ planned sleeve surgery following dietary guidelines for continued weight loss.    NUTRITION INTERVENTION  Nutrition counseling (C-1) and education (E-2) to facilitate bariatric surgery goals.  Educated pt on micronutrient deficiencies post-surgery and behavioral/dietary strategies to start in order  to mitigate that risk   Behavioral and Dietary Interventions Pre-Op Goals Reviewed with the Patient Nutrition: Healthy Eating Behaviors Switch to non-caloric, non-carbonated and non-caffeinated beverages such as  water, unsweetened tea, Crystal Light and zero calorie beverages (aim for 64 oz. per day) Cut out grazing between meals or at night  Find a protein shake you like Eat every 3-5 hours        Eliminate distractions while eating (TV, computer, reading, driving, texting) Take 08-65 minutes to eat a meal  Decrease high sugar foods/decrease high fat/fried foods Eliminate alcoholic beverages Increase protein intake (eggs, fish, chicken, yogurt) before surgery Eat non starchy vegetables 2 times a day 7 days a week Eat complex carbohydrates such as whole grains and fruits   Behavioral Modification: Physical Activity Increase my usual daily activity (use stairs, park farther, etc.) Engage in _______________________  activity  _______ minutes ______ times per week  Other:    _________________________________________________________________     Problem Solving I will think about my usual eating patterns and how to tweak them How can my friends and family support me Barriers to starting my changes Learn and understand appetite verses hunger   Healthy Coping Allow for ___________ activities per week to help me manage stress Reframe negative thoughts I will keep a picture of someone or something that is my inspiration & look at it daily   Monitoring  Weigh myself once a week  Measure my progress by monitoring how my clothes fit Keep a food record of what I eat and drink for the next ________ (time period) Take pictures of what I eat and drink for the next ________ (time period) Use an app to count steps/day for the next_______ (time period) Measure my progress such as increased energy and more restful sleep Monitor your acid reflux and bowel habits, are they getting better?   *Goals  that are bolded indicate the pt would like to start working towards these  Handouts Provided Include  Bariatric Surgery handouts (Nutrition Visits, Pre Surgery Behavioral Change Goals, Protein Shakes Brands to Choose From, Vitamins & Mineral Supplementation)  Learning Style & Readiness for Change Teaching method utilized: Visual, Auditory, and hands on  Demonstrated degree of understanding via: Teach Back  Readiness Level: preparation Barriers to learning/adherence to lifestyle change: sleep walking every night and eating during the episode without knowing.  RD's Notes for Next Visit Patient progress toward chosen goals     MONITORING & EVALUATION Dietary intake, weekly physical activity, body weight, and preoperative behavioral change goals   Next Steps  Patient is to follow up at [facility] in two weeks for first SWL visit.

## 2022-08-29 ENCOUNTER — Ambulatory Visit: Payer: 59 | Admitting: Dietician

## 2022-09-01 ENCOUNTER — Other Ambulatory Visit: Payer: Self-pay

## 2022-09-01 ENCOUNTER — Encounter (HOSPITAL_BASED_OUTPATIENT_CLINIC_OR_DEPARTMENT_OTHER): Payer: Self-pay | Admitting: Emergency Medicine

## 2022-09-01 ENCOUNTER — Emergency Department (HOSPITAL_BASED_OUTPATIENT_CLINIC_OR_DEPARTMENT_OTHER)
Admission: EM | Admit: 2022-09-01 | Discharge: 2022-09-01 | Disposition: A | Discharge status: Home / Self Care | Payer: 59 | Source: Home / Self Care | Attending: Emergency Medicine | Admitting: Emergency Medicine

## 2022-09-01 DIAGNOSIS — G43901 Migraine, unspecified, not intractable, with status migrainosus: Secondary | ICD-10-CM

## 2022-09-01 DIAGNOSIS — G43909 Migraine, unspecified, not intractable, without status migrainosus: Secondary | ICD-10-CM | POA: Diagnosis present

## 2022-09-01 MED ORDER — DIPHENHYDRAMINE HCL 50 MG/ML IJ SOLN
12.5000 mg | Freq: Once | INTRAMUSCULAR | Status: AC
  Administered 2022-09-01: 12.5 mg via INTRAVENOUS
  Filled 2022-09-01: qty 1

## 2022-09-01 MED ORDER — PROCHLORPERAZINE EDISYLATE 10 MG/2ML IJ SOLN
10.0000 mg | Freq: Once | INTRAMUSCULAR | Status: AC
  Administered 2022-09-01: 10 mg via INTRAVENOUS
  Filled 2022-09-01: qty 2

## 2022-09-01 MED ORDER — SODIUM CHLORIDE 0.9 % IV BOLUS
1000.0000 mL | Freq: Once | INTRAVENOUS | Status: AC
  Administered 2022-09-01: 1000 mL via INTRAVENOUS

## 2022-09-01 MED ORDER — KETOROLAC TROMETHAMINE 15 MG/ML IJ SOLN
15.0000 mg | Freq: Once | INTRAMUSCULAR | Status: AC
  Administered 2022-09-01: 15 mg via INTRAVENOUS
  Filled 2022-09-01: qty 1

## 2022-09-01 NOTE — ED Provider Notes (Signed)
[place] EMERGENCY DEPARTMENT AT [facility] Provider Note   CSN: [id number] Arrival date & time: 09/01/22  1152     History  Chief Complaint  Patient presents with   Migraine    Lauren Chapman is a 52 y.o. female.   Migraine  Patient has a headache.  Typical migraine.  Is had for around 4 days now.  Does get frequent headaches and on several medications for.  Sees neurology.  Has had Nurtec and Imitrex and Motrin without relief.  Mild nausea.  No vomiting.     Home Medications Prior to Admission medications   Medication Sig Start Date End Date Taking? Authorizing Provider  albuterol (VENTOLIN HFA) 108 (90 Base) MCG/ACT inhaler Inhale 2 puffs into the lungs every 4 (four) hours as needed for wheezing or shortness of breath. 07/18/22   Cobb, Ruby Cola, NP  ARIPiprazole (ABILIFY) 30 MG tablet Take 30 mg by mouth at bedtime. 12/23/20   [provider]  benztropine (COGENTIN) 0.5 MG tablet Take by mouth. 12/07/21   [provider]  budesonide-formoterol (SYMBICORT) 160-4.5 MCG/ACT inhaler Inhale 2 puffs into the lungs 2 (two) times daily. 07/18/22   Cobb, Ruby Cola, NP  buPROPion (WELLBUTRIN XL) 150 MG 24 hr tablet Take 150 mg by mouth every morning. 04/06/22   [provider]  busPIRone (BUSPAR) 15 MG tablet Take 15 mg by mouth 3 (three) times daily. 07/30/20   [provider]  citalopram (CELEXA) 20 MG tablet Take 30 mg by mouth daily.    [provider]  diclofenac (VOLTAREN) 75 MG EC tablet Take 1 tablet (75 mg total) by mouth 2 (two) times daily. 03/29/22   Lenn Sink, DPM  EPINEPHrine 0.3 mg/0.3 mL IJ SOAJ injection Inject 0.3 mg into the muscle as needed for anaphylaxis. 03/29/21   Al Decant, PA-C  esomeprazole (NEXIUM) 20 MG capsule Take 1 capsule (20 mg total) by mouth 2 (two) times daily before a meal. 03/21/19   Lezlie Lye, Meda Coffee, MD  gabapentin (NEURONTIN) 800 MG tablet Take 800 mg by mouth 3 (three)  times daily.  07/02/18   [provider]  lamoTRIgine (LAMICTAL) 200 MG tablet Take 200 mg by mouth 2 (two) times daily. 12/20/21   [provider]  MYRBETRIQ 50 MG TB24 tablet Take 50 mg by mouth daily. 11/11/21   [provider]  ondansetron (ZOFRAN) 4 MG tablet Take 1 tablet (4 mg total) by mouth every 8 (eight) hours as needed for nausea or vomiting. 01/05/20   Glean Salvo, NP  Semaglutide-Weight Management (WEGOVY) 0.25 MG/0.5ML SOAJ Inject 0.25 mg into the skin once a week. 07/07/22   Shade Flood, MD  SUMAtriptan (IMITREX) 6 MG/0.5ML SOLN injection Inject 0.5 mLs (6 mg total) into the skin See admin instructions. INJECT 6 MG (0.5 ml) INTO THE SKIN AS NEEDED FOR MIGRAINE OR HEADACHE. MAY REPEAT ONCE IN 2 HOURS IF HEADACHE PERSISTS OR RECURS. MAX 2 DOSES IN 24 HOURS. 02/17/22   Drema Dallas, DO  thiothixene (NAVANE) 5 MG capsule Take 5 mg by mouth 3 (three) times daily. 04/07/22   [provider]  tiZANidine (ZANAFLEX) 4 MG tablet Take 1 tablet (4 mg total) by mouth every 6 (six) hours as needed for muscle spasms. 08/15/21   Drema Dallas, DO      Allergies    Codeine    Review of Systems   Review of Systems  Physical Exam Updated Vital Signs BP 96/64  Pulse 64   Temp 97.9 F (36.6 C) (Oral)   Resp 18   LMP 05/02/2019 Comment: irregular cycles  SpO2 96%  Physical Exam Vitals reviewed.  HENT:     Head: Atraumatic.  Musculoskeletal:     Cervical back: Neck supple. No rigidity.  Neurological:     Mental Status: She is alert and oriented to person, place, and time. Mental status is at baseline.     ED Results / Procedures / Treatments   Labs (all labs ordered are listed, but only abnormal results are displayed) Labs Reviewed - No data to display  EKG None  Radiology No results found.  Procedures Procedures    Medications Ordered in ED Medications  sodium chloride 0.9 % bolus 1,000 mL (0 mLs Intravenous Stopped 09/01/22 1431)   prochlorperazine (COMPAZINE) injection 10 mg (10 mg Intravenous Given 09/01/22 1326)  ketorolac (TORADOL) 15 MG/ML injection 15 mg (15 mg Intravenous Given 09/01/22 1326)  diphenhydrAMINE (BENADRYL) injection 12.5 mg (12.5 mg Intravenous Given 09/01/22 1326)    ED Course/ Medical Decision Making/ A&P                                 Medical Decision Making Risk Prescription drug management.  Patient with headache.  History of same with frequent migraines.  Unrelieved with home medications.  Reviewed neurology notes.  Differential diagnose includes migraine, also different causes of headache.  However with typical symptoms do not think we need further workup at this time and will treat.  Will give fluid will give Compazine Toradol and some Benadryl.  1410 Patient rechecked.  Still sleepy.  States headache is feeling somewhat better.  However think she should stay here a little longer.  Will continue to monitor.  1522 patient still sleeping soundly.  Care turned over to Dr. Maple Hudson.         Final Clinical Impression(s) / ED Diagnoses Final diagnoses:  Migraine with status migrainosus, not intractable, unspecified migraine type    Rx / DC Orders ED Discharge Orders     None         Benjiman Core, MD 09/01/22 [phone]

## 2022-09-01 NOTE — Discharge Instructions (Addendum)
Please follow-up with your primary doctor.  Return if develop sudden onset headache, vision changes, facial droop, difficulty finding words, unilateral weakness or any new or worsening symptoms that are concerning to you.

## 2022-09-01 NOTE — ED Triage Notes (Signed)
Pt arrives to ED with c/o migraine x4 days. Has tried Imitrex w/o relief.

## 2022-09-01 NOTE — ED Provider Notes (Signed)
Received signout from morning team.  Disposition pending reevaluation.  See their note for further HPI, but briefly signed out as migraine status post migraine cocktail in the setting of frequent migraines.  No red flag symptoms. Physical Exam  BP 96/64   Pulse 64   Temp 97.9 F (36.6 C) (Oral)   Resp 18   LMP 05/02/2019 Comment: irregular cycles  SpO2 96%   Physical Exam Vitals and nursing note reviewed.  HENT:     Head: Normocephalic and atraumatic.     Nose: Nose normal.  Eyes:     Conjunctiva/sclera: Conjunctivae normal.  Cardiovascular:     Rate and Rhythm: Normal rate.  Pulmonary:     Effort: Pulmonary effort is normal.  Musculoskeletal:     Cervical back: Normal range of motion.  Neurological:     Mental Status: She is alert.     Comments: Moving all extremities uncoordinated fashion.  Texting on phone.  Psychiatric:        Mood and Affect: Mood normal.        Behavior: Behavior normal.     Procedures  Procedures  ED Course / MDM    Medical Decision Making Well-appearing 52 year old female present emergency department for migraine, was treated with migraine cocktail by prior team.  Reports that she is feeling better.  Agree that labs and imaging unlikely to change patient's management or disposition.  Stable for discharge at this time.  Risk Prescription drug management.        Coral Spikes, DO 09/01/22 1642

## 2022-09-06 ENCOUNTER — Ambulatory Visit: Payer: 59 | Admitting: Dietician

## 2022-09-07 ENCOUNTER — Other Ambulatory Visit: Payer: Self-pay

## 2022-09-07 ENCOUNTER — Emergency Department (HOSPITAL_BASED_OUTPATIENT_CLINIC_OR_DEPARTMENT_OTHER)
Admission: EM | Admit: 2022-09-07 | Discharge: 2022-09-07 | Disposition: A | Discharge status: Home / Self Care | Payer: 59 | Attending: Emergency Medicine | Admitting: Emergency Medicine

## 2022-09-07 DIAGNOSIS — G43809 Other migraine, not intractable, without status migrainosus: Secondary | ICD-10-CM | POA: Insufficient documentation

## 2022-09-07 DIAGNOSIS — R519 Headache, unspecified: Secondary | ICD-10-CM | POA: Diagnosis present

## 2022-09-07 MED ORDER — PROCHLORPERAZINE EDISYLATE 10 MG/2ML IJ SOLN
10.0000 mg | Freq: Once | INTRAMUSCULAR | Status: AC
  Administered 2022-09-07: 10 mg via INTRAVENOUS
  Filled 2022-09-07: qty 2

## 2022-09-07 MED ORDER — DEXAMETHASONE SODIUM PHOSPHATE 10 MG/ML IJ SOLN
10.0000 mg | Freq: Once | INTRAMUSCULAR | Status: AC
  Administered 2022-09-07: 10 mg via INTRAVENOUS
  Filled 2022-09-07: qty 1

## 2022-09-07 MED ORDER — SODIUM CHLORIDE 0.9 % IV SOLN: 1000.0000 mL | INTRAVENOUS | Status: DC

## 2022-09-07 MED ORDER — SODIUM CHLORIDE 0.9 % IV BOLUS (SEPSIS)
1000.0000 mL | Freq: Once | INTRAVENOUS | Status: AC
  Administered 2022-09-07: 1000 mL via INTRAVENOUS

## 2022-09-07 MED ORDER — KETOROLAC TROMETHAMINE 30 MG/ML IJ SOLN
30.0000 mg | Freq: Once | INTRAMUSCULAR | Status: AC
  Administered 2022-09-07: 30 mg via INTRAVENOUS
  Filled 2022-09-07: qty 1

## 2022-09-07 MED ORDER — DIPHENHYDRAMINE HCL 50 MG/ML IJ SOLN
12.5000 mg | Freq: Once | INTRAMUSCULAR | Status: AC
  Administered 2022-09-07: 12.5 mg via INTRAVENOUS
  Filled 2022-09-07: qty 1

## 2022-09-07 NOTE — ED Provider Notes (Signed)
[place] EMERGENCY DEPARTMENT AT [facility] Provider Note   CSN: [id number] Arrival date & time: 09/07/22  1517     History  Chief Complaint  Patient presents with   Migraine    Lauren Chapman is a 52 y.o. female.   Migraine   Patient has a history of anxiety bipolar disorder reflux migraines who presents to the ED with complaints of a severe headache similar to previous migraines.  Patient states she had an episode about a week ago.  She was seen in the emergency room treated.  Patient's symptoms resolved however she started having symptoms again within this past week.  Patient states she has been trying her home medications including Imitrex without relief.  Patient has been having nausea vomiting with this headache.  She denies any fevers or chills.  No recent trauma.  She is not having any numbness or weakness.  Patient has not been able to get an appointment with her neurologist    Home Medications Prior to Admission medications   Medication Sig Start Date End Date Taking? Authorizing Provider  albuterol (VENTOLIN HFA) 108 (90 Base) MCG/ACT inhaler Inhale 2 puffs into the lungs every 4 (four) hours as needed for wheezing or shortness of breath. 07/18/22   Cobb, Ruby Cola, NP  ARIPiprazole (ABILIFY) 30 MG tablet Take 30 mg by mouth at bedtime. 12/23/20   [provider]  benztropine (COGENTIN) 0.5 MG tablet Take by mouth. 12/07/21   [provider]  budesonide-formoterol (SYMBICORT) 160-4.5 MCG/ACT inhaler Inhale 2 puffs into the lungs 2 (two) times daily. 07/18/22   Cobb, Ruby Cola, NP  buPROPion (WELLBUTRIN XL) 150 MG 24 hr tablet Take 150 mg by mouth every morning. 04/06/22   [provider]  busPIRone (BUSPAR) 15 MG tablet Take 15 mg by mouth 3 (three) times daily. 07/30/20   [provider]  citalopram (CELEXA) 20 MG tablet Take 30 mg by mouth daily.    [provider]  diclofenac (VOLTAREN) 75 MG EC tablet Take 1  tablet (75 mg total) by mouth 2 (two) times daily. 03/29/22   Lenn Sink, DPM  EPINEPHrine 0.3 mg/0.3 mL IJ SOAJ injection Inject 0.3 mg into the muscle as needed for anaphylaxis. 03/29/21   Al Decant, PA-C  esomeprazole (NEXIUM) 20 MG capsule Take 1 capsule (20 mg total) by mouth 2 (two) times daily before a meal. 03/21/19   Lezlie Lye, Meda Coffee, MD  gabapentin (NEURONTIN) 800 MG tablet Take 800 mg by mouth 3 (three) times daily.  07/02/18   [provider]  lamoTRIgine (LAMICTAL) 200 MG tablet Take 200 mg by mouth 2 (two) times daily. 12/20/21   [provider]  MYRBETRIQ 50 MG TB24 tablet Take 50 mg by mouth daily. 11/11/21   [provider]  ondansetron (ZOFRAN) 4 MG tablet Take 1 tablet (4 mg total) by mouth every 8 (eight) hours as needed for nausea or vomiting. 01/05/20   Glean Salvo, NP  Semaglutide-Weight Management (WEGOVY) 0.25 MG/0.5ML SOAJ Inject 0.25 mg into the skin once a week. 07/07/22   Shade Flood, MD  SUMAtriptan (IMITREX) 6 MG/0.5ML SOLN injection Inject 0.5 mLs (6 mg total) into the skin See admin instructions. INJECT 6 MG (0.5 ml) INTO THE SKIN AS NEEDED FOR MIGRAINE OR HEADACHE. MAY REPEAT ONCE IN 2 HOURS IF HEADACHE PERSISTS OR RECURS. MAX 2 DOSES IN 24 HOURS. 02/17/22   Drema Dallas, DO  thiothixene (NAVANE) 5 MG capsule Take 5  mg by mouth 3 (three) times daily. 04/07/22   [provider]  tiZANidine (ZANAFLEX) 4 MG tablet Take 1 tablet (4 mg total) by mouth every 6 (six) hours as needed for muscle spasms. 08/15/21   Drema Dallas, DO      Allergies    Codeine    Review of Systems   Review of Systems  Physical Exam Updated Vital Signs BP 131/75   Pulse (!) 54   Temp 97.7 F (36.5 C) (Oral)   Resp 20   LMP 05/02/2019 Comment: irregular cycles  SpO2 95%  Physical Exam Vitals and nursing note reviewed.  Constitutional:      Appearance: She is well-developed. She is ill-appearing.  HENT:     Head: Normocephalic  and atraumatic.     Right Ear: External ear normal.     Left Ear: External ear normal.  Eyes:     General: No scleral icterus.       Right eye: No discharge.        Left eye: No discharge.     Conjunctiva/sclera: Conjunctivae normal.  Neck:     Trachea: No tracheal deviation.  Cardiovascular:     Rate and Rhythm: Normal rate.  Pulmonary:     Effort: Pulmonary effort is normal. No respiratory distress.     Breath sounds: No stridor.  Abdominal:     General: There is no distension.  Musculoskeletal:        General: No swelling or deformity.     Cervical back: Normal range of motion and neck supple. No rigidity.  Skin:    General: Skin is warm and dry.     Findings: No rash.  Neurological:     General: No focal deficit present.     Mental Status: She is alert. Mental status is at baseline.     Cranial Nerves: No cranial nerve deficit, dysarthria or facial asymmetry.     Sensory: No sensory deficit.     Motor: No weakness or seizure activity.     ED Results / Procedures / Treatments   Labs (all labs ordered are listed, but only abnormal results are displayed) Labs Reviewed - No data to display  EKG None  Radiology No results found.  Procedures Procedures    Medications Ordered in ED Medications  sodium chloride 0.9 % bolus 1,000 mL (1,000 mLs Intravenous New Bag/Given 09/07/22 1643)    Followed by  0.9 %  sodium chloride infusion (has no administration in time range)  ketorolac (TORADOL) 30 MG/ML injection 30 mg (30 mg Intravenous Given 09/07/22 1644)  prochlorperazine (COMPAZINE) injection 10 mg (10 mg Intravenous Given 09/07/22 1646)  diphenhydrAMINE (BENADRYL) injection 12.5 mg (12.5 mg Intravenous Given 09/07/22 1645)  dexamethasone (DECADRON) injection 10 mg (10 mg Intravenous Given 09/07/22 1646)    ED Course/ Medical Decision Making/ A&P Clinical Course as of 09/07/22 1757  Thu Sep 07, 2022  1755 Patient states her headache has improved.  She is feeling  better now and like to go home and rest [JK]    Clinical Course User Index [JK] Linwood Dibbles, MD                                 Medical Decision Making Problems Addressed: Other migraine without status migrainosus, not intractable: acute illness or injury that poses a threat to life or bodily functions  Risk Prescription drug management.   Patient presented to  ED for evaluation of a severe headache consistent with her prior migraine headaches.  Patient was not having any fevers or chills.  No recent injury.  Low suspicion for meningitis, traumatic brain injury, subarachnoid hemorrhage.  Patient was treated with a migraine cocktail.  Symptoms have improved and she is feeling better.  Evaluation and diagnostic testing in the emergency department does not suggest an emergent condition requiring admission or immediate intervention beyond what has been performed at this time.  The patient is safe for discharge and has been instructed to return immediately for worsening symptoms, change in symptoms or any other concerns.        Final Clinical Impression(s) / ED Diagnoses Final diagnoses:  Other migraine without status migrainosus, not intractable    Rx / DC Orders ED Discharge Orders     None         Linwood Dibbles, MD 09/07/22 1758

## 2022-09-07 NOTE — ED Triage Notes (Signed)
Pt POV from home c/o migraine with "vomiting when the pain gets really bad" x1 week. Hx migraines, took migraine meds with minimal relief. Pt reports she was in ED for same last Friday, felt better with IV meds but migraine came back shortly after returning home and has been constant since.

## 2022-09-07 NOTE — ED Notes (Signed)
Pt discharged home and given discharge paperwork. Opportunities given for questions. Pt verbalizes understanding. PIV removed x1. Stone,Heather R , RN 

## 2022-09-07 NOTE — Discharge Instructions (Signed)
Continue your current medications for your migraine.  Follow-up with your neurologist to be rechecked.  Return as needed for worsening symptoms

## 2022-09-08 ENCOUNTER — Telehealth: Payer: Self-pay

## 2022-09-08 NOTE — Telephone Encounter (Signed)
Patient was contacted to complete a TOC call

## 2022-09-09 ENCOUNTER — Ambulatory Visit (HOSPITAL_BASED_OUTPATIENT_CLINIC_OR_DEPARTMENT_OTHER)
Admission: RE | Admit: 2022-09-09 | Discharge: 2022-09-09 | Disposition: A | Discharge status: Home / Self Care | Payer: 59 | Source: Ambulatory Visit | Attending: Acute Care | Admitting: Acute Care

## 2022-09-09 DIAGNOSIS — Z87891 Personal history of nicotine dependence: Secondary | ICD-10-CM | POA: Diagnosis not present

## 2022-09-09 DIAGNOSIS — Z122 Encounter for screening for malignant neoplasm of respiratory organs: Secondary | ICD-10-CM | POA: Diagnosis present

## 2022-09-11 ENCOUNTER — Encounter: Payer: Self-pay | Admitting: Family Medicine

## 2022-09-11 ENCOUNTER — Ambulatory Visit (INDEPENDENT_AMBULATORY_CARE_PROVIDER_SITE_OTHER): Payer: 59 | Admitting: Family Medicine

## 2022-09-11 VITALS — BP 124/76 | HR 72 | Temp 98.2°F | Ht 63.0 in | Wt 206.2 lb

## 2022-09-11 DIAGNOSIS — G43109 Migraine with aura, not intractable, without status migrainosus: Secondary | ICD-10-CM | POA: Diagnosis not present

## 2022-09-11 MED ORDER — NURTEC 75 MG PO TBDP: 75.0000 mg | ORAL_TABLET | Freq: Every day | ORAL | 0 refills | Status: DC | PRN

## 2022-09-11 NOTE — Patient Instructions (Addendum)
Glad to hear you are better from the ER.  Keep follow-up with neurology as planned to discuss changes in your treatments.  I will refill Nurtec for now.  Hang in there. Return to the clinic or go to the nearest emergency room if any of your symptoms worsen or new symptoms occur.  Migraine Headache A migraine headache is an intense pulsing or throbbing pain on one or both sides of the head. Migraine headaches may also cause other symptoms, such as nausea, vomiting, and sensitivity to light and noise. A migraine headache can last from 4 hours to 3 days. Talk with your health care provider about what things may bring on (trigger) your migraine headaches. What are the causes? The exact cause is not known. However, a migraine may be caused when nerves in the brain get irritated and release chemicals that cause blood vessels to become inflamed. This inflammation causes pain. Migraines may be triggered or caused by: Smoking. Medicines, such as: Nitroglycerin, which is used to treat chest pain. Birth control pills. Estrogen. Certain blood pressure medicines. Foods or drinks that contain nitrates, glutamate, aspartame, MSG, or tyramine. Certain foods or drinks, such as aged cheeses, chocolate, alcohol, or caffeine. Doing physical activity that is very hard. Other triggers may include: Menstruation. Pregnancy. Hunger. Stress. Getting too much or too little sleep. Weather changes. Tiredness (fatigue). What increases the risk? The following factors may make you more likely to have migraine headaches: Being between the ages of 9-68 years old. Being female. Having a family history of migraine headaches. Being Caucasian. Having a mental health condition, such as depression or anxiety. Being obese. What are the signs or symptoms? The main symptom of this condition is pulsing or throbbing pain. This pain may: Happen in any area of the head, such as on one or both sides. Make it hard to do daily  activities. Get worse with physical activity. Get worse around bright lights, loud noises, or smells. Other symptoms may include: Nausea. Vomiting. Dizziness. Before a migraine headache starts, you may get warning signs (an aura). An aura may include: Seeing flashing lights or having blind spots. Seeing bright spots, halos, or zigzag lines. Having tunnel vision or blurred vision. Having numbness or a tingling feeling. Having trouble talking. Having muscle weakness. After a migraine ends, you may have symptoms. These may include: Feeling tired. Trouble concentrating. How is this diagnosed? A migraine headache can be diagnosed based on: Your symptoms. A physical exam. Tests, such as: A CT scan or an MRI of the head. These tests can help rule out other causes of headaches. Taking fluid from the spine (lumbar puncture) to examine it (cerebrospinal fluid analysis, or CSF analysis). How is this treated? This condition may be treated with medicines that: Relieve pain and nausea. Prevent migraines. Treatment may also include: Acupuncture. Lifestyle changes like avoiding foods that trigger migraine headaches. Learning ways to control your body (biofeedback). Talk therapy to help you know and deal with negative thoughts (cognitive behavioral therapy). Follow these instructions at home: Medicines Take over-the-counter and prescription medicines only as told by your provider. Ask your provider if the medicine prescribed to you: Requires you to avoid driving or using machinery. Can cause constipation. You may need to take these actions to prevent or treat constipation: Drink enough fluid to keep your pee (urine) pale yellow. Take over-the-counter or prescription medicines. Eat foods that are high in fiber, such as beans, whole grains, and fresh fruits and vegetables. Limit foods that are high in  fat and processed sugars, such as fried or sweet foods. Lifestyle  Do not drink  alcohol. Do not use any products that contain nicotine or tobacco. These products include cigarettes, chewing tobacco, and vaping devices, such as e-cigarettes. If you need help quitting, ask your provider. Get 7-9 hours of sleep each night, or the amount recommended by your provider. Find ways to manage stress, such as meditation, deep breathing, or yoga. Try to exercise regularly. This can help lessen how bad and how often your migraines occur. General instructions Keep a journal to find out what triggers your migraines, so you can avoid those things. For example, write down: What you eat and drink. How much sleep you get. Any change to your diet or medicines. If you have a migraine headache: Avoid things that make your symptoms worse, such as bright lights. Lie down in a dark, quiet room. Do not drive or use machinery. Ask your provider what activities are safe for you while you have symptoms. Keep all follow-up visits. Your provider will monitor your symptoms and recommend any further treatment. Where to find more information Coalition for Headache and Migraine Patients (CHAMP): headachemigraine.org American Migraine Foundation: americanmigrainefoundation.org National Headache Foundation: headaches.org Contact a health care provider if: You have symptoms that are different or worse than your usual migraine headache symptoms. You have more than 15 days of headaches in one month. Get help right away if: Your migraine headache becomes severe or lasts more than 72 hours. You have a fever or stiff neck. You have vision loss. Your muscles feel weak or like you cannot control them. You lose your balance often or have trouble walking. You faint. You have a seizure. This information is not intended to replace advice given to you by your health care provider. Make sure you discuss any questions you have with your health care provider. Document Revised: 09/05/2021 Document Reviewed:  09/05/2021 Elsevier Patient Education  2024 [employer].

## 2022-09-11 NOTE — Progress Notes (Signed)
Subjective:  Patient ID: Lauren Chapman, female    DOB: [birth year]  Age: 52 y.o. MRN: [id number]  CC:  Chief Complaint  Patient presents with   Hospitalization Follow-up    Pt notes still will get dizzy on the occasion pt notes otherwise doing okay had Hpilori testing this morning, pt does have F/u with Dr Everlena Cooper next week     HPI Lauren Chapman presents for   ER follow-up:  Migraine headaches August 9 ER eval for migraine with status.  Unrelieved with home meds.  Treated with Compazine, Toradol, Benadryl.  Some improvement.  Repeat ER eval on August 15.  Symptoms had improved from prior ER visit and recurred 2 days later.  Tried home meds including Imitrex without relief, had nausea, vomiting with headache.  Treated with Toradol, Compazine, Benadryl, Decadron.  Improvement of symptoms.  She has a history of known migraine disorder, followed by neurology, Dr. Everlena Cooper, and is scheduled for follow-up on August 26.Past 4 days - HA comes and goes, able to resolve pain with imitrex and rest. Taking imitrex once per day at this time. Has increased water intake. No chest pain. Had H pylori test today - some nausea with that test. These are typical of migraine sx's. Aura, n/v.  Tried nurtec (needs RF), then imitrex if not working.  Has migraine med for infusion every 3 months. Vyepti. Last visit with Dr. Everlena Cooper in January noted - increased Vyepti to 300mg  at that time.  On lamictal and gabapentin, followed by psychiatry - Dr. Betti Cruz.   History Patient Active Problem List   Diagnosis Date Noted   Poorly controlled mild persistent asthma 09/05/2021   Syncope 08/02/2021   Tobacco abuse 08/02/2021   Vaginal candidiasis 08/02/2021   CAP (community acquired pneumonia) 07/18/2021   OSA (obstructive sleep apnea) 07/18/2021   Hypokalemia 07/18/2021   Depression 03/24/2020   Bilateral low back pain with left-sided sciatica 03/24/2020   Left lumbar radiculopathy 02/25/2020   Absence of bladder  continence 02/25/2020   History of postmenopausal bleeding 04/29/2019   Polypharmacy 04/03/2019   Emphysema, unspecified (HCC) 05/17/2018   Weakness 01/25/2017   Chronic migraine w/o aura w/o status migrainosus, not intractable 12/22/2014   Encounter for other general counseling or advice on contraception 09/02/2014   Bipolar disorder (HCC) 10/12/2011   Anxiety 10/12/2011   GERD (gastroesophageal reflux disease) 10/12/2011   Past Medical History:  Diagnosis Date   Allergy    Anxiety    Bipolar affective psychosis (HCC)    Complication of anesthesia    " Acting silly- waving at everyone"-Giddy   Depression    Emphysema lung (HCC)    inhaler prn,  followed by pcp and pulmonologist-- dr [name]   Family history of adverse reaction to anesthesia    Father had rheumatic fever, and died on operating table.   GERD (gastroesophageal reflux disease)    Kidney cysts 01/24/2020   Right Kidney   Migraine    Moderate persistent asthma 09/05/2021   Smokers' cough (HCC)    per pt not productive   SUI (stress urinary incontinence, female)    Tubular adenoma of colon 07/08/2020   Wears glasses    Past Surgical History:  Procedure Laterality Date   ABDOMINAL HYSTERECTOMY N/A    Phreesia 11/30/2019   CARPAL TUNNEL RELEASE Right 09-12-2007   @[facility]    and GANGLION CYST EXCISION   COLONOSCOPY     CYSTOSCOPY N/A 04/29/2019   Procedure: CYSTOSCOPY;  Surgeon: [name], MD;  Location: [facility];  Service: Gynecology;  Laterality: N/A;   DILATATION & CURETTAGE/HYSTEROSCOPY WITH MYOSURE N/A 11/13/2018   Procedure: DILATATION & CURETTAGE/HYSTEROSCOPY WITH MYOSURE;  Surgeon: [name], MD;  Location: [facility];  Service: Gynecology;  Laterality: N/A;  request 9:00am OR start time in [place] [facility] block requests one hour   DILATION AND CURETTAGE OF UTERUS  02/2019   EYE SURGERY N/A    Phreesia 11/30/2019   GANGLION CYST EXCISION     R wrist    interstem      implant for overactive bladder   KNEE ARTHROSCOPY WITH ANTERIOR CRUCIATE LIGAMENT (ACL) REPAIR Right 10/2015   LAPAROSCOPIC CHOLECYSTECTOMY  1997   LAPAROSCOPIC HYSTERECTOMY Bilateral 04/29/2019   Procedure: HYSTERECTOMY TOTAL LAPAROSCOPIC, BILATERAL SALPINO-OOPHORECTOMY;  Surgeon: [name], MD;  Location: [facility];  Service: Gynecology;  Laterality: Bilateral;   LAPAROSCOPIC TOTAL HYSTERECTOMY     TOOTH EXTRACTION  04/01/2019   tooth removal Bilateral 2022   4 teeth removed   UPPER GASTROINTESTINAL ENDOSCOPY     Allergies  Allergen Reactions   Codeine Anaphylaxis and Other (See Comments)    Cannot have ANYTHING with codeine   Prior to Admission medications   Medication Sig Start Date End Date Taking? Authorizing Provider  albuterol (VENTOLIN HFA) 108 (90 Base) MCG/ACT inhaler Inhale 2 puffs into the lungs every 4 (four) hours as needed for wheezing or shortness of breath. 07/18/22  Yes Cobb, Ruby Cola, NP  ARIPiprazole (ABILIFY) 30 MG tablet Take 30 mg by mouth at bedtime. 12/23/20  Yes [provider]  benztropine (COGENTIN) 0.5 MG tablet Take by mouth. 12/07/21  Yes [provider]  budesonide-formoterol (SYMBICORT) 160-4.5 MCG/ACT inhaler Inhale 2 puffs into the lungs 2 (two) times daily. 07/18/22  Yes Cobb, Ruby Cola, NP  buPROPion (WELLBUTRIN XL) 150 MG 24 hr tablet Take 150 mg by mouth every morning. 04/06/22  Yes [provider]  busPIRone (BUSPAR) 15 MG tablet Take 15 mg by mouth 3 (three) times daily. 07/30/20  Yes [provider]  citalopram (CELEXA) 20 MG tablet Take 30 mg by mouth daily.   Yes [provider]  diclofenac (VOLTAREN) 75 MG EC tablet Take 1 tablet (75 mg total) by mouth 2 (two) times daily. 03/29/22  Yes Regal, Kirstie Peri, DPM  EPINEPHrine 0.3 mg/0.3 mL IJ SOAJ injection Inject 0.3 mg into the muscle as needed for anaphylaxis. 03/29/21  Yes Al Decant, PA-C  esomeprazole  (NEXIUM) 20 MG capsule Take 1 capsule (20 mg total) by mouth 2 (two) times daily before a meal. 03/21/19  Yes Lezlie Lye, Meda Coffee, MD  gabapentin (NEURONTIN) 800 MG tablet Take 800 mg by mouth 3 (three) times daily.  07/02/18  Yes [provider]  lamoTRIgine (LAMICTAL) 200 MG tablet Take 200 mg by mouth 2 (two) times daily. 12/20/21  Yes [provider]  MYRBETRIQ 50 MG TB24 tablet Take 50 mg by mouth daily. 11/11/21  Yes [provider]  ondansetron (ZOFRAN) 4 MG tablet Take 1 tablet (4 mg total) by mouth every 8 (eight) hours as needed for nausea or vomiting. 01/05/20  Yes Glean Salvo, NP  Semaglutide-Weight Management (WEGOVY) 0.25 MG/0.5ML SOAJ Inject 0.25 mg into the skin once a week. 07/07/22  Yes Shade Flood, MD  SUMAtriptan (IMITREX) 6 MG/0.5ML SOLN injection Inject 0.5 mLs (6 mg total) into the skin See admin instructions. INJECT 6 MG (0.5 ml) INTO THE SKIN AS NEEDED FOR MIGRAINE  OR HEADACHE. MAY REPEAT ONCE IN 2 HOURS IF HEADACHE PERSISTS OR RECURS. MAX 2 DOSES IN 24 HOURS. 02/17/22  Yes Everlena Cooper, Adam R, DO  thiothixene (NAVANE) 5 MG capsule Take 5 mg by mouth 3 (three) times daily. 04/07/22  Yes [provider]  tiZANidine (ZANAFLEX) 4 MG tablet Take 1 tablet (4 mg total) by mouth every 6 (six) hours as needed for muscle spasms. 08/15/21  Yes Drema Dallas, DO   Social History   Socioeconomic History   Marital status: Divorced    Spouse name: n/a   Number of children: 2   Years of education: 18   Highest education level: Not on file  Occupational History   Occupation: Disabled    Comment: Formerly a Runner, broadcasting/film/video.  Tobacco Use   Smoking status: Former    Current packs/day: 0.00    Average packs/day: 1 pack/day for 39.8 years (39.8 ttl pk-yrs)    Types: Cigarettes    Start date: 10/10/1981    Quit date: 07/18/2021    Years since quitting: 1.1    Passive exposure: Never   Smokeless tobacco: Never  Vaping Use   Vaping status: Never Used   Substance and Sexual Activity   Alcohol use: Never    Alcohol/week: 0.0 standard drinks of alcohol   Drug use: Never   Sexual activity: Not Currently    Partners: Male    Birth control/protection: Post-menopausal    Comment: 1st intercourse 52 yo-More than 5 partners  Other Topics Concern   Not on file  Social History Narrative   Lives at home with 1 of her two daughters   Right-handed.   2-4 cups caffeine daily.   Disabled    One story home   Social Determinants of Health   Financial Resource Strain: Low Risk  (11/02/2021)   Overall Financial Resource Strain (CARDIA)    Difficulty of Paying Living Expenses: Not hard at all  Food Insecurity: Food Insecurity Present (11/10/2021)   Hunger Vital Sign    Worried About Running Out of Food in the Last Year: Sometimes true    Ran Out of Food in the Last Year: Sometimes true  Transportation Needs: No Transportation Needs (11/02/2021)   PRAPARE - Administrator, Civil Service (Medical): No    Lack of Transportation (Non-Medical): No  Physical Activity: Insufficiently Active (11/02/2021)   Exercise Vital Sign    Days of Exercise per Week: 3 days    Minutes of Exercise per Session: 30 min  Stress: No Stress Concern Present (11/02/2021)   [employer] of Occupational Health - Occupational Stress Questionnaire    Feeling of Stress : Not at all  Social Connections: Moderately Isolated (11/02/2021)   Social Connection and Isolation Panel [NHANES]    Frequency of Communication with Friends and Family: More than three times a week    Frequency of Social Gatherings with Friends and Family: More than three times a week    Attends Religious Services: More than 4 times per year    Active Member of [employer] or Organizations: No    Attends Banker Meetings: Never    Marital Status: Divorced  Catering manager Violence: Not At Risk (11/02/2021)   Humiliation, Afraid, Rape, and Kick questionnaire    Fear of Current or  Ex-Partner: No    Emotionally Abused: No    Physically Abused: No    Sexually Abused: No    Review of Systems  Per HPI.  Objective:   Vitals:  09/11/22 1145  BP: 124/76  Pulse: 72  Temp: 98.2 F (36.8 C)  TempSrc: Temporal  SpO2: 98%  Weight: 206 lb 3.2 oz (93.5 kg)  Height: 5\' 3"  (1.6 m)     Physical Exam Vitals reviewed.  Constitutional:      Appearance: Normal appearance. She is well-developed.  HENT:     Head: Normocephalic and atraumatic.  Eyes:     Conjunctiva/sclera: Conjunctivae normal.     Pupils: Pupils are equal, round, and reactive to light.  Neck:     Vascular: No carotid bruit.  Cardiovascular:     Rate and Rhythm: Normal rate and regular rhythm.     Heart sounds: Normal heart sounds.  Pulmonary:     Effort: Pulmonary effort is normal.     Breath sounds: Normal breath sounds.  Abdominal:     Palpations: Abdomen is soft. There is no pulsatile mass.     Tenderness: There is no abdominal tenderness.  Musculoskeletal:     Right lower leg: No edema.     Left lower leg: No edema.  Skin:    General: Skin is warm and dry.  Neurological:     General: No focal deficit present.     Mental Status: She is alert and oriented to person, place, and time. Mental status is at baseline.     GCS: GCS eye subscore is 4. GCS verbal subscore is 5. GCS motor subscore is 6.     Cranial Nerves: No cranial nerve deficit or facial asymmetry.     Sensory: No sensory deficit.     Motor: No weakness, tremor, seizure activity or pronator drift.     Coordination: Coordination normal. Finger-Nose-Finger Test and Heel to [facility] Test normal. Rapid alternating movements normal.  Psychiatric:        Mood and Affect: Mood normal.        Behavior: Behavior normal.    Assessment & Plan:  Lauren Chapman is a 52 y.o. female . Migraine with aura and without status migrainosus, not intractable - Plan: Rimegepant Sulfate (NURTEC) 75 MG TBDP Recurrent migraine headaches, nonfocal  neuroexam in office.  Overall improved since ER visit.  Temporarily refilled Nurtec but follow-up planned with neurologist soon.  ER precautions given.  No other change in meds at this time.  Meds ordered this encounter  Medications   Rimegepant Sulfate (NURTEC) 75 MG TBDP    Sig: Take 1 tablet (75 mg total) by mouth daily as needed.    Dispense:  10 tablet    Refill:  0   Patient Instructions  Glad to hear you are better from the ER.  Keep follow-up with neurology as planned to discuss changes in your treatments.  I will refill Nurtec for now.  Hang in there.    Signed,   Meredith Staggers, MD [facility] 09/11/22 12:44 PM

## 2022-09-12 ENCOUNTER — Encounter: Payer: Self-pay | Admitting: Dietician

## 2022-09-12 ENCOUNTER — Encounter: Payer: 59 | Admitting: Dietician

## 2022-09-12 VITALS — Ht 63.0 in | Wt 205.6 lb

## 2022-09-12 DIAGNOSIS — E669 Obesity, unspecified: Secondary | ICD-10-CM | POA: Diagnosis not present

## 2022-09-12 NOTE — Progress Notes (Signed)
Supervised Weight Loss Visit Bariatric Nutrition Education Appt Start Time: 7:50    End Time: 8:20  Referral stated Supervised Weight Loss (SWL) visits needed: 4 months  1 out of 4 SWL Appointments  Planned surgery: Sleeve Gastrectomy Pt expectation of surgery: To about 135 lbs   NUTRITION ASSESSMENT   Anthropometrics  Start weight at [facility]: 212.7 lbs (date: 08/28/2022)  Height: 63 in Weight today: 205.6 lb BMI: 36.42 kg/m2     Clinical Medical Hx: allergies, anxiety, asthma, COPD, depression, GERD, prediabetes Medications: reviewed Labs: 11/07/21: A1C 6.1% Notable Signs/Symptoms: stomach pain, acid reflux. Food Allergies: none  Lifestyle & Dietary Hx  Pt states she is not eating, stating her mother is giving her a hard time about what she eats. Pt states she is still sleep walking, stating she is not eating when she does. Pt states she has an appointment with her neurologist, stating she is wondering if they can give her something to help her sleep.  Estimated daily fluid intake: 64-80 oz Supplements: no Current average weekly physical activity: walking, 3-4 days a week, 25-30 minutes at a time, with 66-75% recommendation reached  24-Hr Dietary Recall First Meal: oatmeal or toast with peanut butter or skip Snack: Triscuit Second Meal: skip Snack: Triscuit Third Meal: grilled chicken, vegetables Snack: ice cream Beverages: water, coffee (1/2 cup a day)  Estimated Energy Needs Calories: 1500   NUTRITION DIAGNOSIS  Overweight/obesity ([place]-3.3) related to past poor dietary habits and physical inactivity as evidenced by patient w/ planned sleeve surgery following dietary guidelines for continued weight loss.   NUTRITION INTERVENTION  Nutrition counseling (C-1) and education (E-2) to facilitate bariatric surgery goals.  Pre-Op Goals Reviewed with the Patient Nutrition: Healthy Eating Behaviors Switch to non-caloric, non-carbonated and non-caffeinated beverages such as   water, unsweetened tea, Crystal Light and zero calorie beverages (aim for 64 oz. per day) Cut out grazing between meals or at night  Find a protein shake you like Eat every 3-5 hours        Eliminate distractions while eating (TV, computer, reading, driving, texting) Take 16-10 minutes to eat a meal  Decrease high sugar foods/decrease high fat/fried foods Eliminate alcoholic beverages Increase protein intake (eggs, fish, chicken, yogurt) before surgery Eat non starchy vegetables 2 times a day 7 days a week Eat complex carbohydrates such as whole grains and fruits   Behavioral Modification: Physical Activity Increase my usual daily activity (use stairs, park farther, etc.) Engage in _______________________  activity  _______ minutes ______ times per week  Other:    _________________________________________________________________     Problem Solving I will think about my usual eating patterns and how to tweak them How can my friends and family support me Barriers to starting my changes Learn and understand appetite verses hunger   Healthy Coping Allow for ___________ activities per week to help me manage stress Reframe negative thoughts I will keep a picture of someone or something that is my inspiration & look at it daily   Monitoring  Weigh myself once a week  Measure my progress by monitoring how my clothes fit Keep a food record of what I eat and drink for the next ________ (time period) Take pictures of what I eat and drink for the next ________ (time period) Use an app to count steps/day for the next_______ (time period) Measure my progress such as increased energy and more restful sleep Monitor your acid reflux and bowel habits, are they getting better?   *Goals that are bolded  indicate the pt would like to start working towards these  Pre-Op Goals Progress & New Goals Continue: Eat every 3-5 hours Continue: Cut out grazing between meals or at night New: taking 20-30  minutes to eat a meal or snack; identify signs of satisfaction before fullness. New: Have Mom help and/or come to next visit. New: Aim for 2 or more servings of non-starchy vegetables per day.  Handouts Provided Include  Meal Ideas Bariatric MyPlate  Learning Style & Readiness for Change Teaching method utilized: Visual & Auditory  Demonstrated degree of understanding via: Teach Back  Readiness Level: preparation Barriers to learning/adherence to lifestyle change: Dynamics at home.  RD's Notes for next Visit  Patient progress toward chosen goals  MONITORING & EVALUATION Dietary intake, weekly physical activity, body weight, and pre-op goals in 1 month.   Next Steps  Patient is to return to [facility] in one month for next SWL visit.

## 2022-09-14 ENCOUNTER — Other Ambulatory Visit (HOSPITAL_COMMUNITY): Payer: Self-pay | Admitting: Surgery

## 2022-09-15 ENCOUNTER — Other Ambulatory Visit: Payer: Self-pay

## 2022-09-15 DIAGNOSIS — Z122 Encounter for screening for malignant neoplasm of respiratory organs: Secondary | ICD-10-CM

## 2022-09-15 DIAGNOSIS — Z87891 Personal history of nicotine dependence: Secondary | ICD-10-CM

## 2022-09-15 NOTE — Progress Notes (Unsigned)
NEUROLOGY FOLLOW UP OFFICE NOTE  Lauren Chapman [id number]  Assessment/Plan:     Migraine with aura, without status migrainosus, not intractable    Migraine prevention:  Vyepti 300mg  Q78m Migraine rescue:  sumatriptan 60mg  [place].  Will provide samples of Nurtec to try as well.  May take with Zofran and tizanidine.   Magnesium citrate 400mg  qd, riboflavin 400mg  qd, CoQ-10 100mg  TID Discussed diet/caffeine cessation/exercise Continue use of CPAP.  Followed by sleep clinic at [facility] Keep headache diary Follow up 6 months.       Subjective:  Lauren Chapman is a 52 year old right-handed female with emphysema, Bipolar disorder, depression, anxiety, and history of tubular adenoma of the colon who follows up for migraines.    UPDATE: Increased Vyepti to 300mg , status post 2 doses.  Noticed improvement Intensity:  3-4/10 Duration:  usually 15-30 minutes with Nurtec (samples) as first line and sumatriptan [place] second line Frequency:  2 tension headaches for first month, 1 a week (or 2 every 2 weeks) for second month (3 were "migraines"), 4 to 2 days third month but more severe (dizzy, nausea, vomiting) in which they lasted 1 hour after 2 second injection This month, she required ED visit twice.  Frequency of abortive medication: 6 days a month Current NSAIDS/analgesics:  ASA Current triptans:  Sumatriptan 6mg  [place] Current ergotamine:  none Current anti-emetic:  Zofran 4mg  Current muscle relaxants:  tizanidine 4mg  Current Antihypertensive medications:  none Current Antidepressant medications:  citalopram 20mg  daily Current Anticonvulsant medications:  gabapentin 800mg  three times daily, lamotrigine 150mg  BID Current anti-CGRP:  Vyepti 300mg  every 3 months Current Vitamins/Herbal/Supplements:  magnesium citrate 400mg  daily, riboflavin 400mg  daily, CoQ-10 100mg  TID.   Current Antihistamines/Decongestants:  none Other therapy:  none Hormone/birth control:  none Other medications:  Abilify,  buspirone, benztropine   Caffeine:  3 pods a day of decaf coffee.  No longer drinks cola Smoking:  quit Diet:  Drinks Bubbly but no water.  Sometimes Sprite.  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  Eats junk food. Eats meat/chicken with starch vegetables/potatoes Exercise:  no Depression:  yes; Anxiety:  yes Other pain:  Chronic back pain, lumbar radicular pain.  Sleep hygiene:  Diagnosed with OSA in June.  On CPAP but still without rested sleep.  Sleepwalks.  Reports increased sleepwalking.     HISTORY:  Onset:  52 years old Location:  left sided (frontal/retro-orbital) Quality:  pounding Intensity:  9-10/10.   Aura:  sometimes headaches preceded by visual aura (fuzzy vision/floaters in left eye) and phantosmia (metallic smell) Prodrome:  absent Associated symptoms:  Nausea, vomiting, photophobia, phonophobia, dizziness (once every 2 months).  She denies associated unilateral numbness or weakness. Duration:  1-2 days, sometimes 4-5 days (years ago would last 1-2 weeks) Frequency:  4 days a week Frequency of abortive medication: 5 days a week switches between sumatriptan and Excedrin Migraine Triggers/aggravating factors:  not eating, stress, worse when laying down Relieving factors:  nothing Activity:  aggravates   History of medication overuse leading to daily headaches in 2021. MRI of brain without contrast on 03/16/2020 revealed minimal nonspecific scattered hyperintense foci within the bilateral periventricular and subcortical white matter, stable compared to prior MRI from 02/06/2017.   History of chronic low back pain.  In 2021 reported worsening left lower back pain with radicular pain down the left leg causing left leg weakness.  Also endorsed bowel and bladder incontinence.  MRI of lumbar spine without contrast on  03/09/2020 personally reviewed showed no spinal or foraminal stenosis.  MRI of cervical spine on 03/09/2020 personally reviewed showed mild  spondylosis and disc bulging from C3-4 to C6-7 with mild left foraminal stenosis at C4-5 but otherwise no spinal canal stenosis or foraminal stenosis.  MRI of thoracic spine on 03/16/2020 personally reviewed was unremarkable.         Past NSAIDS/analgesics:  ibuprofen, naproxen, BC powder, diclofenac 75mg , meloxicam, Excedrin Past abortive triptans:  rizatriptan 10mg , sumatriptan 20mg  NS/tab Past abortive ergotamine:  none Past muscle relaxants:  methocarbamol, cyclobenzaprine Past anti-emetic:  none Past antihypertensive medications:  propranolol Past antidepressant medications:  amitriptyline, imipramine, sertraline, mirtazapine, Wellbutrin, vortioxetine, vilazodone Past anticonvulsant medications:  none Past anti-CGRP:  Aimovig, Ajovy, Emgality Past vitamins/Herbal/Supplements:  none Past antihistamines/decongestants:  Flonase Other past therapies:  Botox     Family history of headache:  daughters (migraines)  PAST MEDICAL HISTORY: Past Medical History:  Diagnosis Date   Allergy    Anxiety    Bipolar affective psychosis (HCC)    Complication of anesthesia    " Acting silly- waving at everyone"-Giddy   Depression    Emphysema lung (HCC)    inhaler prn,  followed by pcp and pulmonologist-- dr [name]   Family history of adverse reaction to anesthesia    Father had rheumatic fever, and died on operating table.   GERD (gastroesophageal reflux disease)    Kidney cysts 01/24/2020   Right Kidney   Migraine    Moderate persistent asthma 09/05/2021   Smokers' cough (HCC)    per pt not productive   SUI (stress urinary incontinence, female)    Tubular adenoma of colon 07/08/2020   Wears glasses     MEDICATIONS: Current Outpatient Medications on File Prior to Visit  Medication Sig Dispense Refill   albuterol (VENTOLIN HFA) 108 (90 Base) MCG/ACT inhaler Inhale 2 puffs into the lungs every 4 (four) hours as needed for wheezing or shortness of breath. 1 each 1   ARIPiprazole  (ABILIFY) 30 MG tablet Take 30 mg by mouth at bedtime.     benztropine (COGENTIN) 0.5 MG tablet Take by mouth.     budesonide-formoterol (SYMBICORT) 160-4.5 MCG/ACT inhaler Inhale 2 puffs into the lungs 2 (two) times daily. 1 each 6   buPROPion (WELLBUTRIN XL) 150 MG 24 hr tablet Take 150 mg by mouth every morning.     busPIRone (BUSPAR) 15 MG tablet Take 15 mg by mouth 3 (three) times daily.     citalopram (CELEXA) 20 MG tablet Take 30 mg by mouth daily.     diclofenac (VOLTAREN) 75 MG EC tablet Take 1 tablet (75 mg total) by mouth 2 (two) times daily. 50 tablet 2   EPINEPHrine 0.3 mg/0.3 mL IJ SOAJ injection Inject 0.3 mg into the muscle as needed for anaphylaxis. 1 each 0   esomeprazole (NEXIUM) 20 MG capsule Take 1 capsule (20 mg total) by mouth 2 (two) times daily before a meal. 60 capsule 1   gabapentin (NEURONTIN) 800 MG tablet Take 800 mg by mouth 3 (three) times daily.      lamoTRIgine (LAMICTAL) 200 MG tablet Take 200 mg by mouth 2 (two) times daily.     MYRBETRIQ 50 MG TB24 tablet Take 50 mg by mouth daily.     ondansetron (ZOFRAN) 4 MG tablet Take 1 tablet (4 mg total) by mouth every 8 (eight) hours as needed for nausea or vomiting. 20 tablet 6   Rimegepant Sulfate (NURTEC) 75 MG TBDP Take 1 tablet (  75 mg total) by mouth daily as needed. 10 tablet 0   Semaglutide-Weight Management (WEGOVY) 0.25 MG/0.5ML SOAJ Inject 0.25 mg into the skin once a week. 2 mL 1   SUMAtriptan (IMITREX) 6 MG/0.5ML SOLN injection Inject 0.5 mLs (6 mg total) into the skin See admin instructions. INJECT 6 MG (0.5 ml) INTO THE SKIN AS NEEDED FOR MIGRAINE OR HEADACHE. MAY REPEAT ONCE IN 2 HOURS IF HEADACHE PERSISTS OR RECURS. MAX 2 DOSES IN 24 HOURS. 3 mL 5   thiothixene (NAVANE) 5 MG capsule Take 5 mg by mouth 3 (three) times daily.     tiZANidine (ZANAFLEX) 4 MG tablet Take 1 tablet (4 mg total) by mouth every 6 (six) hours as needed for muscle spasms. 30 tablet 5   No current facility-administered medications  on file prior to visit.    ALLERGIES: Allergies  Allergen Reactions   Codeine Anaphylaxis and Other (See Comments)    Cannot have ANYTHING with codeine    FAMILY HISTORY: Family History  Problem Relation Age of Onset   Depression Mother    Cancer Mother        Cervical   Alcohol abuse Father    Crohn's disease Sister    Stroke Brother 9   Migraines Daughter    GER disease Daughter    Depression Daughter    Migraines Daughter    GER disease Daughter    Colon cancer Neg Hx    Rectal cancer Neg Hx    Stomach cancer Neg Hx       Objective:  Blood pressure 104/60, pulse 76, height 5\' 3"  (1.6 m), weight 203 lb (92.1 kg), last menstrual period 05/02/2019, SpO2 96%. General: No acute distress.  Patient appears well-groomed.   Head:  Normocephalic/atraumatic Eyes:  Fundi examined but not visualized Neck: supple, no paraspinal tenderness, full range of motion Heart:  Regular rate and rhythm Neurological Exam: alert and oriented.  Speech fluent and not dysarthric, language intact.  CN II-XII intact. Bulk and tone normal, muscle strength 5/5 throughout.  Sensation to light touch intact.  Deep tendon reflexes 2+ throughout.  Finger to nose testing intact.  Gait normal, Romberg negative.   Shon Millet, DO  CC: Meredith Staggers, MD

## 2022-09-18 ENCOUNTER — Ambulatory Visit (INDEPENDENT_AMBULATORY_CARE_PROVIDER_SITE_OTHER): Payer: 59 | Admitting: Neurology

## 2022-09-18 ENCOUNTER — Telehealth: Payer: Self-pay

## 2022-09-18 ENCOUNTER — Encounter: Payer: Self-pay | Admitting: Neurology

## 2022-09-18 VITALS — BP 104/60 | HR 76 | Ht 63.0 in | Wt 203.0 lb

## 2022-09-18 DIAGNOSIS — G43109 Migraine with aura, not intractable, without status migrainosus: Secondary | ICD-10-CM | POA: Diagnosis not present

## 2022-09-18 MED ORDER — NURTEC 75 MG PO TBDP: 1.0000 | ORAL_TABLET | Freq: Every day | ORAL | 11 refills | Status: DC | PRN

## 2022-09-18 NOTE — Patient Instructions (Signed)
Vyepti 300mg  every 3 months Nurtec once daily as needed.  Sumatriptan shot as needed, may repeat after 1 hour (2 in 24 hours)

## 2022-09-18 NOTE — Telephone Encounter (Signed)
PA needed for Nurtec. 

## 2022-09-19 ENCOUNTER — Other Ambulatory Visit (HOSPITAL_COMMUNITY): Payer: 59

## 2022-09-19 ENCOUNTER — Encounter (HOSPITAL_COMMUNITY): Payer: 59

## 2022-09-27 ENCOUNTER — Ambulatory Visit (INDEPENDENT_AMBULATORY_CARE_PROVIDER_SITE_OTHER): Payer: 59

## 2022-09-27 VITALS — BP 105/72 | HR 68 | Temp 97.9°F | Resp 14 | Ht 63.0 in | Wt 208.8 lb

## 2022-09-27 DIAGNOSIS — G43709 Chronic migraine without aura, not intractable, without status migrainosus: Secondary | ICD-10-CM

## 2022-09-27 MED ORDER — SODIUM CHLORIDE 0.9 % IV SOLN
300.0000 mg | Freq: Once | INTRAVENOUS | Status: AC
  Administered 2022-09-27: 300 mg via INTRAVENOUS
  Filled 2022-09-27: qty 3

## 2022-09-27 NOTE — Progress Notes (Signed)
Diagnosis: Migraines  Provider:  Chilton Greathouse MD  Procedure: IV Infusion  IV Type: Peripheral, IV Location: R Hand  Vyepti (Eptinezumab-jjmr), Dose: 300 mg  Infusion Start Time: 0949  Infusion Stop Time: 1021  Post Infusion IV Care: Patient declined observation and Peripheral IV Discontinued  Discharge: Condition: Stable, Destination: Home . AVS Declined  Performed by:  Rico Ala, LPN

## 2022-09-28 ENCOUNTER — Ambulatory Visit (HOSPITAL_COMMUNITY)
Admission: RE | Admit: 2022-09-28 | Discharge: 2022-09-28 | Disposition: A | Discharge status: Home / Self Care | Payer: 59 | Source: Ambulatory Visit | Attending: Surgery | Admitting: Surgery

## 2022-10-06 ENCOUNTER — Ambulatory Visit (HOSPITAL_COMMUNITY)
Admission: RE | Admit: 2022-10-06 | Discharge: 2022-10-06 | Disposition: A | Discharge status: Home / Self Care | Payer: 59 | Source: Ambulatory Visit | Attending: Surgery

## 2022-10-06 ENCOUNTER — Encounter (HOSPITAL_COMMUNITY)
Admission: RE | Admit: 2022-10-06 | Discharge: 2022-10-06 | Disposition: A | Discharge status: Home / Self Care | Payer: 59 | Source: Ambulatory Visit | Attending: Surgery | Admitting: Surgery

## 2022-10-06 ENCOUNTER — Ambulatory Visit (HOSPITAL_COMMUNITY)
Admission: RE | Admit: 2022-10-06 | Discharge: 2022-10-06 | Disposition: A | Discharge status: Home / Self Care | Payer: 59 | Source: Ambulatory Visit | Attending: Surgery | Admitting: Surgery

## 2022-10-06 DIAGNOSIS — Z01818 Encounter for other preprocedural examination: Secondary | ICD-10-CM | POA: Diagnosis present

## 2022-10-11 ENCOUNTER — Encounter: Payer: Self-pay | Admitting: Neurology

## 2022-10-11 ENCOUNTER — Other Ambulatory Visit (HOSPITAL_COMMUNITY): Payer: Self-pay

## 2022-10-11 NOTE — Telephone Encounter (Signed)
Pharmacy Patient Advocate Encounter   Received notification from Pt Calls Messages that prior authorization for Nurtec 75MG  dispersible tablets  is required/requested.   Insurance verification completed.   The patient is insured through [facility] .   Per test claim: The current 30 day co-pay is, $0.00.  No PA needed at this time. This test claim was processed through [facility]- copay amounts may vary at other pharmacies due to pharmacy/plan contracts, or as the patient moves through the different stages of their insurance plan.

## 2022-10-16 ENCOUNTER — Ambulatory Visit: Payer: 59 | Admitting: Dietician

## 2022-10-18 ENCOUNTER — Ambulatory Visit (HOSPITAL_COMMUNITY): Payer: Self-pay | Admitting: Licensed Clinical Social Worker

## 2022-10-19 ENCOUNTER — Encounter: Payer: 59 | Attending: Surgery | Admitting: Dietician

## 2022-10-19 ENCOUNTER — Encounter: Payer: Self-pay | Admitting: Dietician

## 2022-10-19 VITALS — Ht 63.0 in | Wt 208.0 lb

## 2022-10-19 DIAGNOSIS — E669 Obesity, unspecified: Secondary | ICD-10-CM | POA: Diagnosis present

## 2022-10-19 NOTE — Progress Notes (Signed)
Supervised Weight Loss Visit Bariatric Nutrition Education Appt Start Time: 8:51    End Time: 9:22  Referral stated Supervised Weight Loss (SWL) visits needed: 4 months  2 out of 4 SWL Appointments  Planned surgery: Sleeve Gastrectomy Pt expectation of surgery: To about 135 lbs   NUTRITION ASSESSMENT   Anthropometrics  Start weight at [facility]: 212.7 lbs (date: 08/28/2022)  Height: 63 in Weight today: 208.0 lb BMI: 36.85 kg/m2     Clinical Medical Hx: allergies, anxiety, asthma, COPD, depression, GERD, prediabetes Medications: reviewed Labs: 11/07/21: A1C 6.1% Notable Signs/Symptoms: stomach pain, acid reflux. Food Allergies: none  Lifestyle & Dietary Hx  Pt states some days she can slow down when she eat, stating some days when it is good, she eats faster. Pt states she can recognize the feeling of satisfaction. Pt states her mother is on board as a more of a support, stating she is understanding the goals now.  Estimated daily fluid intake: 64-80+ oz Supplements: women's multivitamin Current average weekly physical activity: walking at work and at home, 6 days a week, 30 minutes at a time.  24-Hr Dietary Recall First Meal: scrambled eggs, grits, bacon Snack: special K cereal Second Meal: [place] sandwich on whole grain bread Snack: broccoli and carrots Third Meal: grilled chicken, vegetables Snack: tuna packs or fruit Beverages: water, coffee (1/2 cup a day)  Estimated Energy Needs Calories: 1500  NUTRITION DIAGNOSIS  Overweight/obesity ([place]-3.3) related to past poor dietary habits and physical inactivity as evidenced by patient w/ planned sleeve surgery following dietary guidelines for continued weight loss.   NUTRITION INTERVENTION  Nutrition counseling (C-1) and education (E-2) to facilitate bariatric surgery goals.  Encouraged patient to honor their body's internal hunger and fullness cues.  Throughout the day, check in mentally and rate hunger. Stop eating when  satisfied not full regardless of how much food is left on the plate.  Get more if still hungry 20-30 minutes later.  The key is to honor satisfaction so throughout the meal, rate fullness factor and stop when comfortably satisfied not physically full. The key is to honor hunger and fullness without any feelings of guilt or shame.  Pay attention to what the internal cues are, rather than any external factors. This will enhance the confidence you have in listening to your own body and following those internal cues enabling you to increase how often you eat when you are hungry not out of appetite and stop when you are satisfied not full.  Encouraged pt to continue to eat balanced meals inclusive of non starchy vegetables 2 times a day 7 days a week Encouraged pt to choose lean protein sources: limiting beef, pork, sausage, hotdogs, and lunch meat Encourage pt to choose healthy fats such as plant based limiting animal fats Encouraged pt to continue to drink a minium 64 fluid ounces with half being plain water to satisfy proper hydration  Pre-Op Goals Progress & New Goals Continue: Eat every 3-5 hours Continue: Cut out grazing between meals or at night Continue: taking 20-30 minutes to eat a meal or snack; identify signs of satisfaction before fullness. Continue: Have Mom help and/or come to next visit. Continue: Aim for 2 or more servings of non-starchy vegetables per day. New: track protein; aim for 60 grams a day. New: try unsweetened plant based milks (soy, almond, oat)  Handouts Provided Include  Goals Printed  Learning Style & Readiness for Change Teaching method utilized: Visual & Auditory  Demonstrated degree of understanding via: Teach Back  Readiness Level: preparation Barriers to learning/adherence to lifestyle change: Dynamics at home.  RD's Notes for next Visit  Patient progress toward chosen goals  MONITORING & EVALUATION Dietary intake, weekly physical activity, body weight, and  pre-op goals in 1 month.   Next Steps  Patient is to return to [facility] in one month for next SWL visit.

## 2022-11-10 ENCOUNTER — Encounter: Payer: 59 | Admitting: Family Medicine

## 2022-11-16 ENCOUNTER — Ambulatory Visit: Payer: 59 | Admitting: Dietician

## 2022-11-17 ENCOUNTER — Encounter: Payer: Self-pay | Admitting: Neurology

## 2022-11-22 ENCOUNTER — Ambulatory Visit (HOSPITAL_COMMUNITY): Payer: 59 | Admitting: Licensed Clinical Social Worker

## 2022-11-28 ENCOUNTER — Ambulatory Visit: Payer: 59 | Admitting: Dietician

## 2022-11-29 ENCOUNTER — Encounter: Payer: Self-pay | Admitting: Podiatry

## 2022-11-29 ENCOUNTER — Ambulatory Visit (INDEPENDENT_AMBULATORY_CARE_PROVIDER_SITE_OTHER): Payer: 59 | Admitting: Podiatry

## 2022-11-29 DIAGNOSIS — L600 Ingrowing nail: Secondary | ICD-10-CM | POA: Diagnosis not present

## 2022-11-29 DIAGNOSIS — M7751 Other enthesopathy of right foot: Secondary | ICD-10-CM | POA: Diagnosis not present

## 2022-11-29 DIAGNOSIS — M205X1 Other deformities of toe(s) (acquired), right foot: Secondary | ICD-10-CM

## 2022-11-29 MED ORDER — PREDNISONE 10 MG PO TABS: ORAL_TABLET | ORAL | 0 refills | Status: DC

## 2022-11-29 MED ORDER — TRIAMCINOLONE ACETONIDE 10 MG/ML IJ SUSP
10.0000 mg | Freq: Once | INTRAMUSCULAR | Status: AC
Start: 2022-11-29 — End: 2022-11-29
  Administered 2022-11-29: 10 mg via INTRA_ARTICULAR

## 2022-11-29 NOTE — Progress Notes (Signed)
Subjective:   Patient ID: Lauren Chapman, female   DOB: 52 y.o.   MRN: [id number]   HPI Patient presents stating still having a lot of pain in the right forefoot with swelling and the nail on the fifth right has become very dystrophic and painful when I try to wear shoe gear now   ROS      Objective:  Physical Exam  Neurovascular status was found to be intact negative Denna Haggard' sign range of motion first MPJ is adequate with no crepitus of the joint with quite a bit of discomfort in the second MPJ right and in the dorsal surface second metatarsal with a damaged thickened incurvated fifth nailbed right painful     Assessment:  Appears to be more of a inflammatory condition with reasonable motion around the first MPJ no crepitus with pain more in this different area and ingrown toenail right fifth nailbed     Plan:  H&P reviewed all conditions.  Proximal nerve block was done in the right and I then did a careful injection of the dorsal tendon complex right 3 mg dexamethasone Kenalog 5 mg Xylocaine and I was able to aspirate the second MPJ and just put 1/4 cc dexamethasone Kenalog into the second MPJ.  For the nail have recommended removal I let her read consent form understanding procedure correction she wants to get it done and I explained risk.  Patient had the right fifth toe anesthetized 60 mg like Marcaine mixture sterile prep done using sterile instrumentation remove the fifth nailbed exposed matrix applied phenol for applications 30 seconds followed by alcohol lavage sterile dressing gave instructions on soaks and wear dressing for 24 hours take it off earlier if throbbing were to occur

## 2022-11-29 NOTE — Patient Instructions (Signed)

## 2022-12-05 ENCOUNTER — Encounter: Payer: Self-pay | Admitting: Dietician

## 2022-12-05 ENCOUNTER — Encounter: Payer: 59 | Attending: Surgery | Admitting: Dietician

## 2022-12-05 VITALS — Ht 63.0 in | Wt 209.1 lb

## 2022-12-05 DIAGNOSIS — Z713 Dietary counseling and surveillance: Secondary | ICD-10-CM | POA: Diagnosis not present

## 2022-12-05 DIAGNOSIS — Z6837 Body mass index (BMI) 37.0-37.9, adult: Secondary | ICD-10-CM | POA: Insufficient documentation

## 2022-12-05 DIAGNOSIS — E669 Obesity, unspecified: Secondary | ICD-10-CM | POA: Insufficient documentation

## 2022-12-05 NOTE — Progress Notes (Signed)
Supervised Weight Loss Visit Bariatric Nutrition Education Appt Start Time: 11:12    End Time: 11:35  Referral stated Supervised Weight Loss (SWL) visits needed: 4 months  3 out of 4 SWL Appointments  Planned surgery: Sleeve Gastrectomy Pt expectation of surgery: To about 135 lbs   NUTRITION ASSESSMENT   Anthropometrics  Start weight at [facility]: 212.7 lbs (date: 08/28/2022)  Height: 63 in Weight today: 209.1 lb BMI: 37.04 kg/m2     Clinical Medical Hx: allergies, anxiety, asthma, COPD, depression, GERD, prediabetes Medications: reviewed Labs: 11/07/21: A1C 6.1% Notable Signs/Symptoms: stomach pain, acid reflux. Food Allergies: none  Lifestyle & Dietary Hx  Pt states she got a new water bottle, stating it is 40 oz instead of 32, stating she has been drinking two of those per day. Pt states she has been tracking her protein, stating she is averaging 56 grams per day. Pt states she is trying to get to 60 grams per day. Pt states she has tried plant based milk, stating she likes the unsweetened vanilla oat milk. Pt states she has an appointment for her foot in the next week. Pt states her mom has not been very supportive and states that her mother makes statements about her weight and body. Pt states her two daughters are supportive, stating they both live in [place].  Estimated daily fluid intake: 64-80+ oz Supplements: women's multivitamin Current average weekly physical activity: walking at work and at home, 2 days a week, 30 minutes at a time (walking slow, due to foot)  24-Hr Dietary Recall First Meal: scrambled eggs, grits, bacon, fruit, yogurt Snack: special K cereal, plums or nectarine Second Meal: [place] sandwich on whole grain bread or rice and ham and cottage cheese Snack: broccoli and carrots Third Meal: grilled chicken, vegetables Snack:  Beverages: water, coffee (2 cups a day)  Estimated Energy Needs Calories: 1500  NUTRITION DIAGNOSIS  Overweight/obesity  ([place]-3.3) related to past poor dietary habits and physical inactivity as evidenced by patient w/ planned sleeve surgery following dietary guidelines for continued weight loss.  NUTRITION INTERVENTION  Nutrition counseling (C-1) and education (E-2) to facilitate bariatric surgery goals.  Encouraged patient to honor their body's internal hunger and fullness cues.  Throughout the day, check in mentally and rate hunger. Stop eating when satisfied not full regardless of how much food is left on the plate.  Get more if still hungry 20-30 minutes later.  The key is to honor satisfaction so throughout the meal, rate fullness factor and stop when comfortably satisfied not physically full. The key is to honor hunger and fullness without any feelings of guilt or shame.  Pay attention to what the internal cues are, rather than any external factors. This will enhance the confidence you have in listening to your own body and following those internal cues enabling you to increase how often you eat when you are hungry not out of appetite and stop when you are satisfied not full.  Encouraged pt to continue to eat balanced meals inclusive of non starchy vegetables 2 times a day 7 days a week Encouraged pt to choose lean protein sources: limiting beef, pork, sausage, hotdogs, and lunch meat Encourage pt to choose healthy fats such as plant based limiting animal fats Encouraged pt to continue to drink a minium 64 fluid ounces with half being plain water to satisfy proper hydration  Pre-Op Goals Progress & New Goals Continue: Eat every 3-5 hours Continue: Cut out grazing between meals or at night Continue: taking 20-30  minutes to eat a meal or snack; identify signs of satisfaction before fullness. Continue: Have Mom help and/or come to next visit. Continue: Aim for 2 or more servings of non-starchy vegetables per day. Continue: track protein; aim for 60 grams a day. Continue: try unsweetened plant based milks (soy,  almond, oat) New: go back to decaf coffee New: separate fluids from meals and snacks; stop drinking 15 minutes before eating. not during and wait 30 minutes after eating before drinking again.  Handouts Provided Include  Goals Printed  Learning Style & Readiness for Change Teaching method utilized: Visual & Auditory  Demonstrated degree of understanding via: Teach Back  Readiness Level: preparation Barriers to learning/adherence to lifestyle change: Dynamics at home.  RD's Notes for next Visit  Patient progress toward chosen goals  MONITORING & EVALUATION Dietary intake, weekly physical activity, body weight, and pre-op goals in 1 month.   Next Steps  Patient is to return to [facility] in one month for next SWL visit.

## 2022-12-11 NOTE — Patient Instructions (Signed)

## 2022-12-11 NOTE — Progress Notes (Unsigned)
PATIENT: Lauren Chapman DOB: 08-25-1970  REASON FOR VISIT: follow up HISTORY FROM: patient  No chief complaint on file.    HISTORY OF PRESENT ILLNESS:  12/11/22 ALL:  Lelania returns for follow up for OSA on CPAP.     12/12/2021 ALL:  [name] returns for follow up for OSA on CPAP. She reports doing well. She does continue to have some episodes of sleep walking followed by psychiatry. She is seen every three months. She has a Veterinary surgeon she sees every 2-3 weeks. She feels that she sleeps fairly well. She is using her machine nightly for about 6 hours. She reports her cat will wake her up at night. She lives with her parents. She works part time at [employer].   She is followed by Dr Adriana Mccallum for migraines. She continues Vyepti infusions. She reports significant improvement on infusion.     12/09/2020 ALL: Lauren Chapman is a 52 y.o. female here today for follow up for OSA on CPAP. HST 08/2020 showed mild OSA with AHI 10.5/hr and O2 nadir 81%. She has done well with CPAP therapy. She is tolerating therapy. She is sleeping better. She may wake up during the night and not being able to go back to sleep. She will get up and drink a couple cups of coffee and usually goes back to sleep. She seems to sleep "really heavy" at this time. Sleepwalking is not occurring as frequently. She does continues to have daytime sleepiness. She is able to go to sleep in no time. She continues to follow up closely with psychiatry for schizoaffective disorder.   Please see CMA note for compliance report   HISTORY: (copied from Dr Teofilo Pod previous note)  Dear Dr. Neva Seat,   I saw your patient, Lauren Chapman, upon your kind request in my today for initial consultation of her sleep disorder, in particular, concern for underlying obstructive sleep apnea and history of sleepwalking since childhood.  The patient is unaccompanied today.  As you know, Ms. Lauren Chapman is a 51 year old right-handed woman with an  underlying medical history of allergies, anxiety, depression, emphysema, reflux disease, kidney cyst, migraine headaches (for which she is followed by my colleague Dr. Terrace Arabia), and obesity, who reports snoring and excessive daytime somnolence as well as witnessed apneas.  She reports a longstanding history of sleepwalking since childhood.  She recently had a fall as she got out of bed.  She hit her head.  She was evaluated in the emergency room and had a head CT and cervical spine CT without contrast on 06/30/2020 and I reviewed the results: IMPRESSION: No acute intracranial abnormality seen.   Multilevel degenerative changes are noted. No acute abnormality seen in the cervical spine.    I reviewed your office note from 07/05/2020. Her Epworth sleepiness score is 20 out of 24, fatigue severity score is 49 out of 63.  Of note, she was on multiple potentially sedating medications including Abilify, Celexa, clonazepam, Neurontin, Vistaril, Tofranil, Lamictal, Remeron.  She is followed by psychiatry.  She was in inpatient care and is now followed as an outpatient.  She lives alone, her youngest daughter recently moved out.  She reports a longstanding history of sleepwalking, sleep talking, and sleep-related eating since childhood.  She has fallen while sleepwalking.  She tries to wake herself up but cannot do so and ends up falling and has hurt herself including her right knee some years ago.  She falls asleep quickly and has been sleepy at the  wheel.  She currently does not work.  Bedtime is generally between 930 and 10 and rise time around 5 AM.  She has been told by her daughters that she makes choking sounds and pauses while asleep.  She has woken up with a sense of gasping or snorting.  She has occasionally woken up with a headache.  She does not have any night to night nocturia, is not aware of any family history of sleep apnea.  Weight has been fluctuating.  She smokes about half a pack per day, does not drink  any alcohol and drinks caffeine in the form of coffee, about 2 cups in the mornings.   REVIEW OF SYSTEMS: Out of a complete 14 system review of symptoms, the patient complains only of the following symptoms, daytime sleepiness, sleepwalking and all other reviewed systems are negative.  ESS: 12/24  ALLERGIES: Allergies  Allergen Reactions   Codeine Anaphylaxis and Other (See Comments)    Cannot have ANYTHING with codeine    HOME MEDICATIONS: Outpatient Medications Prior to Visit  Medication Sig Dispense Refill   albuterol (VENTOLIN HFA) 108 (90 Base) MCG/ACT inhaler Inhale 2 puffs into the lungs every 4 (four) hours as needed for wheezing or shortness of breath. 1 each 1   ALPRAZolam (XANAX) 1 MG tablet Take 1 mg by mouth 2 (two) times daily as needed.     ARIPiprazole (ABILIFY) 30 MG tablet Take 30 mg by mouth at bedtime.     benztropine (COGENTIN) 0.5 MG tablet Take by mouth.     budesonide-formoterol (SYMBICORT) 160-4.5 MCG/ACT inhaler Inhale 2 puffs into the lungs 2 (two) times daily. 1 each 6   buPROPion (WELLBUTRIN XL) 150 MG 24 hr tablet Take 150 mg by mouth every morning.     busPIRone (BUSPAR) 15 MG tablet Take 15 mg by mouth 3 (three) times daily.     citalopram (CELEXA) 20 MG tablet Take 30 mg by mouth daily.     diclofenac (VOLTAREN) 75 MG EC tablet Take 1 tablet (75 mg total) by mouth 2 (two) times daily. 50 tablet 2   EPINEPHrine 0.3 mg/0.3 mL IJ SOAJ injection Inject 0.3 mg into the muscle as needed for anaphylaxis. 1 each 0   esomeprazole (NEXIUM) 20 MG capsule Take 1 capsule (20 mg total) by mouth 2 (two) times daily before a meal. 60 capsule 1   gabapentin (NEURONTIN) 800 MG tablet Take 800 mg by mouth 3 (three) times daily.      lamoTRIgine (LAMICTAL) 200 MG tablet Take 200 mg by mouth 2 (two) times daily.     MYRBETRIQ 50 MG TB24 tablet Take 50 mg by mouth daily.     ondansetron (ZOFRAN) 4 MG tablet Take 1 tablet (4 mg total) by mouth every 8 (eight) hours as needed  for nausea or vomiting. 20 tablet 6   predniSONE (DELTASONE) 10 MG tablet 12 day tapering dose 48 tablet 0   Rimegepant Sulfate (NURTEC) 75 MG TBDP Take 1 tablet (75 mg total) by mouth daily as needed. 8 tablet 11   SUMAtriptan (IMITREX) 6 MG/0.5ML SOLN injection Inject 0.5 mLs (6 mg total) into the skin See admin instructions. INJECT 6 MG (0.5 ml) INTO THE SKIN AS NEEDED FOR MIGRAINE OR HEADACHE. MAY REPEAT ONCE IN 2 HOURS IF HEADACHE PERSISTS OR RECURS. MAX 2 DOSES IN 24 HOURS. 3 mL 5   thiothixene (NAVANE) 5 MG capsule Take 5 mg by mouth 3 (three) times daily.     tiZANidine (ZANAFLEX) 4  MG tablet Take 1 tablet (4 mg total) by mouth every 6 (six) hours as needed for muscle spasms. 30 tablet 5   traMADol (ULTRAM) 50 MG tablet Take 50 mg by mouth every 6 (six) hours as needed.     No facility-administered medications prior to visit.    PAST MEDICAL HISTORY: Past Medical History:  Diagnosis Date   Allergy    Anxiety    Bipolar affective psychosis (HCC)    Complication of anesthesia    " Acting silly- waving at everyone"-Giddy   Depression    Emphysema lung (HCC)    inhaler prn,  followed by pcp and pulmonologist-- dr [name]   Family history of adverse reaction to anesthesia    Father had rheumatic fever, and died on operating table.   GERD (gastroesophageal reflux disease)    Kidney cysts 01/24/2020   Right Kidney   Migraine    Moderate persistent asthma 09/05/2021   Smokers' cough (HCC)    per pt not productive   SUI (stress urinary incontinence, female)    Tubular adenoma of colon 07/08/2020   Wears glasses     PAST SURGICAL HISTORY: Past Surgical History:  Procedure Laterality Date   ABDOMINAL HYSTERECTOMY N/A    Phreesia 11/30/2019   CARPAL TUNNEL RELEASE Right 09-12-2007   @[facility]    and GANGLION CYST EXCISION   COLONOSCOPY     CYSTOSCOPY N/A 04/29/2019   Procedure: CYSTOSCOPY;  Surgeon: [name], MD;  Location: [facility];  Service: Gynecology;   Laterality: N/A;   DILATATION & CURETTAGE/HYSTEROSCOPY WITH MYOSURE N/A 11/13/2018   Procedure: DILATATION & CURETTAGE/HYSTEROSCOPY WITH MYOSURE;  Surgeon: [name], MD;  Location: [facility];  Service: Gynecology;  Laterality: N/A;  request 9:00am OR start time in [place] [facility] block requests one hour   DILATION AND CURETTAGE OF UTERUS  02/2019   EYE SURGERY N/A    Phreesia 11/30/2019   GANGLION CYST EXCISION     R wrist   interstem      implant for overactive bladder   KNEE ARTHROSCOPY WITH ANTERIOR CRUCIATE LIGAMENT (ACL) REPAIR Right 10/2015   LAPAROSCOPIC CHOLECYSTECTOMY  1997   LAPAROSCOPIC HYSTERECTOMY Bilateral 04/29/2019   Procedure: HYSTERECTOMY TOTAL LAPAROSCOPIC, BILATERAL SALPINO-OOPHORECTOMY;  Surgeon: [name], MD;  Location: [facility];  Service: Gynecology;  Laterality: Bilateral;   LAPAROSCOPIC TOTAL HYSTERECTOMY     TOOTH EXTRACTION  04/01/2019   tooth removal Bilateral 2022   4 teeth removed   UPPER GASTROINTESTINAL ENDOSCOPY      FAMILY HISTORY: Family History  Problem Relation Age of Onset   Depression Mother    Cancer Mother        Cervical   Alcohol abuse Father    Crohn's disease Sister    Stroke Brother 4   Migraines Daughter    GER disease Daughter    Depression Daughter    Migraines Daughter    GER disease Daughter    Colon cancer Neg Hx    Rectal cancer Neg Hx    Stomach cancer Neg Hx     SOCIAL HISTORY: Social History   Socioeconomic History   Marital status: Divorced    Spouse name: n/a   Number of children: 2   Years of education: 18   Highest education level: Not on file  Occupational History   Occupation: Disabled    Comment: Formerly a Runner, broadcasting/film/video.  Tobacco Use   Smoking status: Former    Current packs/day: 0.00  Average packs/day: 1 pack/day for 39.8 years (39.8 ttl pk-yrs)    Types: Cigarettes    Start date: 10/10/1981    Quit date: 07/18/2021    Years since quitting: 1.4     Passive exposure: Never   Smokeless tobacco: Never  Vaping Use   Vaping status: Never Used  Substance and Sexual Activity   Alcohol use: Never    Alcohol/week: 0.0 standard drinks of alcohol   Drug use: Never   Sexual activity: Not Currently    Partners: Male    Birth control/protection: Post-menopausal    Comment: 1st intercourse 52 yo-More than 5 partners  Other Topics Concern   Not on file  Social History Narrative   Lives at home with 1 of her two daughters   Right-handed.   2-4 cups caffeine daily.   Disabled    One story home   Social Determinants of Health   Financial Resource Strain: Low Risk  (11/02/2021)   Overall Financial Resource Strain (CARDIA)    Difficulty of Paying Living Expenses: Not hard at all  Food Insecurity: Food Insecurity Present (11/10/2021)   Hunger Vital Sign    Worried About Running Out of Food in the Last Year: Sometimes true    Ran Out of Food in the Last Year: Sometimes true  Transportation Needs: No Transportation Needs (11/02/2021)   PRAPARE - Administrator, Civil Service (Medical): No    Lack of Transportation (Non-Medical): No  Physical Activity: Insufficiently Active (11/02/2021)   Exercise Vital Sign    Days of Exercise per Week: 3 days    Minutes of Exercise per Session: 30 min  Stress: No Stress Concern Present (11/02/2021)   [employer] of Occupational Health - Occupational Stress Questionnaire    Feeling of Stress : Not at all  Social Connections: Moderately Isolated (11/02/2021)   Social Connection and Isolation Panel [NHANES]    Frequency of Communication with Friends and Family: More than three times a week    Frequency of Social Gatherings with Friends and Family: More than three times a week    Attends Religious Services: More than 4 times per year    Active Member of [employer] or Organizations: No    Attends Banker Meetings: Never    Marital Status: Divorced  Catering manager Violence:  Not At Risk (11/02/2021)   Humiliation, Afraid, Rape, and Kick questionnaire    Fear of Current or Ex-Partner: No    Emotionally Abused: No    Physically Abused: No    Sexually Abused: No     PHYSICAL EXAM  There were no vitals filed for this visit.   There is no height or weight on file to calculate BMI.  Generalized: Well developed, in no acute distress  Cardiology: normal rate and rhythm, no murmur noted Respiratory: clear to auscultation bilaterally  Neurological examination  Mentation: Alert oriented to time, place, history taking. Follows all commands speech and language fluent Cranial nerve II-XII: Pupils were equal round reactive to light. Extraocular movements were full, visual field were full  Motor: The motor testing reveals 5 over 5 strength of all 4 extremities. Good symmetric motor tone is noted throughout.  Gait and station: Gait is normal.    DIAGNOSTIC DATA (LABS, IMAGING, TESTING) - I reviewed patient records, labs, notes, testing and imaging myself where available.      No data to display           Lab Results  Component Value Date  WBC 9.2 10/30/2021   HGB 12.4 10/30/2021   HCT 37.5 10/30/2021   MCV 90.1 10/30/2021   PLT 235 10/30/2021      Component Value Date/Time   NA 141 05/10/2022 1200   NA 143 03/24/2019 1510   K 4.7 05/10/2022 1200   CL 102 05/10/2022 1200   CO2 29 05/10/2022 1200   GLUCOSE 87 05/10/2022 1200   BUN 13 05/10/2022 1200   BUN 10 03/24/2019 1510   CREATININE 1.13 05/10/2022 1200   CREATININE 1.12 (H) 08/17/2015 1608   CALCIUM 9.8 05/10/2022 1200   PROT 7.6 07/07/2022 0933   PROT 6.9 03/24/2019 1510   ALBUMIN 4.6 07/07/2022 0933   ALBUMIN 4.4 03/24/2019 1510   AST 23 07/07/2022 0933   ALT 38 (H) 07/07/2022 0933   ALKPHOS 82 07/07/2022 0933   BILITOT 0.4 07/07/2022 0933   BILITOT <0.2 03/24/2019 1510   GFRNONAA >60 10/30/2021 1743   GFRAA 71 03/24/2019 1510   Lab Results  Component Value Date   CHOL 153  11/07/2021   HDL 52.50 11/07/2021   LDLCALC 73 11/07/2021   TRIG 134.0 11/07/2021   CHOLHDL 3 11/07/2021   Lab Results  Component Value Date   HGBA1C 5.8 05/10/2022   No results found for: "VITAMINB12" Lab Results  Component Value Date   TSH 1.33 07/07/2022     ASSESSMENT AND PLAN 52 y.o. year old female  has a past medical history of Allergy, Anxiety, Bipolar affective psychosis (HCC), Complication of anesthesia, Depression, Emphysema lung (HCC), Family history of adverse reaction to anesthesia, GERD (gastroesophageal reflux disease), Kidney cysts (01/24/2020), Migraine, Moderate persistent asthma (09/05/2021), Smokers' cough (HCC), SUI (stress urinary incontinence, female), Tubular adenoma of colon (07/08/2020), and Wears glasses. here with   No diagnosis found.   CLEDA HILTUNEN is doing well on CPAP therapy. Compliance report reveals excellent daily and acceptable 4 hour compliance. She was encouraged to continue using CPAP nightly and for greater than 4 hours each night. We will update supply orders as indicated. Risks of untreated sleep apnea review and education materials provided. She does have daytime sleepiness. She is on multiple mood medicaitons that can have sedating side effects. She was encouraged to focus on sleep hygiene. Continue discussion of sleep walking with psychiatry. Healthy lifestyle habits encouraged. She will follow up in 1 year, sooner if needed. She verbalizes understanding and agreement with this plan.    No orders of the defined types were placed in this encounter.     No orders of the defined types were placed in this encounter.      Shawnie Dapper, FNP-C 12/11/2022, 4:32 PM [facility] [address] [place], [place] [address] [phone]

## 2022-12-12 ENCOUNTER — Telehealth: Payer: Self-pay | Admitting: *Deleted

## 2022-12-12 NOTE — Telephone Encounter (Signed)
Called patient and left detailed message on voicemail asking patient to please bring cpap machine along with power cord to visit on 12/13/22

## 2022-12-13 ENCOUNTER — Encounter: Payer: Self-pay | Admitting: Neurology

## 2022-12-13 ENCOUNTER — Encounter: Payer: Self-pay | Admitting: Family Medicine

## 2022-12-13 ENCOUNTER — Ambulatory Visit (INDEPENDENT_AMBULATORY_CARE_PROVIDER_SITE_OTHER): Payer: 59 | Admitting: Family Medicine

## 2022-12-13 VITALS — BP 121/84 | HR 91 | Ht 63.0 in | Wt 206.0 lb

## 2022-12-13 DIAGNOSIS — G4733 Obstructive sleep apnea (adult) (pediatric): Secondary | ICD-10-CM | POA: Diagnosis not present

## 2022-12-27 ENCOUNTER — Ambulatory Visit: Payer: 59

## 2022-12-27 VITALS — BP 112/77 | HR 69 | Temp 97.3°F | Resp 18 | Ht 63.0 in | Wt 207.2 lb

## 2022-12-27 DIAGNOSIS — G43709 Chronic migraine without aura, not intractable, without status migrainosus: Secondary | ICD-10-CM | POA: Diagnosis not present

## 2022-12-27 MED ORDER — SODIUM CHLORIDE 0.9 % IV SOLN
300.0000 mg | Freq: Once | INTRAVENOUS | Status: AC
  Administered 2022-12-27: 300 mg via INTRAVENOUS
  Filled 2022-12-27: qty 3

## 2022-12-27 NOTE — Progress Notes (Signed)
Diagnosis: Migraine  Provider:  Chilton Greathouse MD  Procedure: IV Infusion  IV Type: Peripheral, IV Location: L Forearm  Vyepti (Eptinezumab-jjmr), Dose: 300 mg  Infusion Start Time: 1052  Infusion Stop Time: 1124  Post Infusion IV Care: Peripheral IV Discontinued  Discharge: Condition: Good, Destination: Home . AVS Declined  Performed by:  Rico Ala, LPN

## 2022-12-29 ENCOUNTER — Ambulatory Visit: Payer: 59 | Admitting: Radiology

## 2023-01-02 ENCOUNTER — Ambulatory Visit: Payer: 59 | Admitting: Dietician

## 2023-01-03 ENCOUNTER — Ambulatory Visit (INDEPENDENT_AMBULATORY_CARE_PROVIDER_SITE_OTHER): Payer: 59 | Admitting: Podiatry

## 2023-01-03 ENCOUNTER — Encounter: Payer: Self-pay | Admitting: Podiatry

## 2023-01-03 DIAGNOSIS — L600 Ingrowing nail: Secondary | ICD-10-CM

## 2023-01-03 NOTE — Progress Notes (Signed)
Subjective:   Patient ID: Lauren Chapman, female   DOB: 52 y.o.   MRN: [id number]   HPI Patient presents stating the right foot is feeling much better and the left foot the fifth nail is starting to bother me more and I like it removed like the right   ROS      Objective:  Physical Exam  Neurovascular status intact right forefoot pain that is diminishing with diminished swelling range of motion adequate with no pain and a damaged fifth nail left that incurvated and sore     Assessment:  Continued improvement right from surgery and also stress fracture with damaged left fifth nail painful     Plan:  H&P reviewed do not recommend further treatment for right as it appears to be stable and for the left recommended nail removal explained procedure risk patient wants surgery and I went ahead today and allowed her to sign consent form.  I infiltrated the left fifth toe 60 mg like Marcaine mixture sterile prep done using sterile instrumentation remove the nail exposed matrix applied phenol 3 applications 30 seconds followed by alcohol lavage sterile dressing gave instructions on soaks wear dressing 24 hours take it off earlier if throbbing were to occur

## 2023-01-09 ENCOUNTER — Ambulatory Visit (INDEPENDENT_AMBULATORY_CARE_PROVIDER_SITE_OTHER): Payer: 59 | Admitting: *Deleted

## 2023-01-09 DIAGNOSIS — Z0000 Encounter for general adult medical examination without abnormal findings: Secondary | ICD-10-CM

## 2023-01-09 NOTE — Patient Instructions (Signed)
Lauren Chapman , Thank you for taking time to come for your Medicare Wellness Visit. I appreciate your ongoing commitment to your health goals. Please review the following plan we discussed and let me know if I can assist you in the future.   Screening recommendations/referrals: Colonoscopy: up to date Mammogram: up to ate Recommended yearly ophthalmology/optometry visit for glaucoma screening and checkup Recommended yearly dental visit for hygiene and checkup  Vaccinations: Influenza vaccine: up to date Tdap vaccine: up to date Shingles vaccine: up to date    Advanced directives: Education provided   Preventive Care 40-64, Female Preventive care refers to lifestyle choices and visits with your health care provider that can promote health and wellness. What does preventive care include? A yearly physical exam. This is also called an annual well check. Dental exams once or twice a year. Routine eye exams. Ask your health care provider how often you should have your eyes checked. Personal lifestyle choices, including: Daily care of your teeth and gums. Regular physical activity. Eating a healthy diet. Avoiding tobacco and drug use. Limiting alcohol use. Practicing safe sex. Taking low-dose aspirin every day. Taking vitamin and mineral supplements as recommended by your health care provider. What happens during an annual well check? The services and screenings done by your health care provider during your annual well check will depend on your age, overall health, lifestyle risk factors, and family history of disease. Counseling  Your health care provider may ask you questions about your: Alcohol use. Tobacco use. Drug use. Emotional well-being. Home and relationship well-being. Sexual activity. Eating habits. History of falls. Memory and ability to understand (cognition). Work and work Astronomer. Reproductive health. Screening  You may have the following tests or  measurements: Height, weight, and BMI. Blood pressure. Lipid and cholesterol levels. These may be checked every 5 years, or more frequently if you are over 37 years old. Skin check. Lung cancer screening. You may have this screening every year starting at age 73 if you have a 30-pack-year history of smoking and currently smoke or have quit within the past 15 years. Fecal occult blood test (FOBT) of the stool. You may have this test every year starting at age 51. Flexible sigmoidoscopy or colonoscopy. You may have a sigmoidoscopy every 5 years or a colonoscopy every 10 years starting at age 48. Hepatitis C blood test. Hepatitis B blood test. Sexually transmitted disease (STD) testing. Diabetes screening. This is done by checking your blood sugar (glucose) after you have not eaten for a while (fasting). You may have this done every 1-3 years. Bone density scan. This is done to screen for osteoporosis. You may have this done starting at age 42. Mammogram. This may be done every 1-2 years. Talk to your health care provider about how often you should have regular mammograms. Talk with your health care provider about your test results, treatment options, and if necessary, the need for more tests. Vaccines  Your health care provider may recommend certain vaccines, such as: Influenza vaccine. This is recommended every year. Tetanus, diphtheria, and acellular pertussis (Tdap, Td) vaccine. You may need a Td booster every 10 years. Zoster vaccine. You may need this after age 38. Pneumococcal 13-valent conjugate (PCV13) vaccine. One dose is recommended after age 14. Pneumococcal polysaccharide (PPSV23) vaccine. One dose is recommended after age 57. Talk to your health care provider about which screenings and vaccines you need and how often you need them. This information is not intended to replace advice given to  you by your health care provider. Make sure you discuss any questions you have with your  health care provider. Document Released: 02/05/2015 Document Revised: 09/29/2015 Document Reviewed: 11/10/2014 Elsevier Interactive Patient Education  2017 [employer].  Fall Prevention in the Home Falls can cause injuries. They can happen to people of all ages. There are many things you can do to make your home safe and to help prevent falls. What can I do on the outside of my home? Regularly fix the edges of walkways and driveways and fix any cracks. Remove anything that might make you trip as you walk through a door, such as a raised step or threshold. Trim any bushes or trees on the path to your home. Use bright outdoor lighting. Clear any walking paths of anything that might make someone trip, such as rocks or tools. Regularly check to see if handrails are loose or broken. Make sure that both sides of any steps have handrails. Any raised decks and porches should have guardrails on the edges. Have any leaves, snow, or ice cleared regularly. Use sand or salt on walking paths during winter. Clean up any spills in your garage right away. This includes oil or grease spills. What can I do in the bathroom? Use night lights. Install grab bars by the toilet and in the tub and shower. Do not use towel bars as grab bars. Use non-skid mats or decals in the tub or shower. If you need to sit down in the shower, use a plastic, non-slip stool. Keep the floor dry. Clean up any water that spills on the floor as soon as it happens. Remove soap buildup in the tub or shower regularly. Attach bath mats securely with double-sided non-slip rug tape. Do not have throw rugs and other things on the floor that can make you trip. What can I do in the bedroom? Use night lights. Make sure that you have a light by your bed that is easy to reach. Do not use any sheets or blankets that are too big for your bed. They should not hang down onto the floor. Have a firm chair that has side arms. You can use this for  support while you get dressed. Do not have throw rugs and other things on the floor that can make you trip. What can I do in the kitchen? Clean up any spills right away. Avoid walking on wet floors. Keep items that you use a lot in easy-to-reach places. If you need to reach something above you, use a strong step stool that has a grab bar. Keep electrical cords out of the way. Do not use floor polish or wax that makes floors slippery. If you must use wax, use non-skid floor wax. Do not have throw rugs and other things on the floor that can make you trip. What can I do with my stairs? Do not leave any items on the stairs. Make sure that there are handrails on both sides of the stairs and use them. Fix handrails that are broken or loose. Make sure that handrails are as long as the stairways. Check any carpeting to make sure that it is firmly attached to the stairs. Fix any carpet that is loose or worn. Avoid having throw rugs at the top or bottom of the stairs. If you do have throw rugs, attach them to the floor with carpet tape. Make sure that you have a light switch at the top of the stairs and the bottom of the stairs.  If you do not have them, ask someone to add them for you. What else can I do to help prevent falls? Wear shoes that: Do not have high heels. Have rubber bottoms. Are comfortable and fit you well. Are closed at the toe. Do not wear sandals. If you use a stepladder: Make sure that it is fully opened. Do not climb a closed stepladder. Make sure that both sides of the stepladder are locked into place. Ask someone to hold it for you, if possible. Clearly mark and make sure that you can see: Any grab bars or handrails. First and last steps. Where the edge of each step is. Use tools that help you move around (mobility aids) if they are needed. These include: Canes. Walkers. Scooters. Crutches. Turn on the lights when you go into a dark area. Replace any light bulbs as soon  as they burn out. Set up your furniture so you have a clear path. Avoid moving your furniture around. If any of your floors are uneven, fix them. If there are any pets around you, be aware of where they are. Review your medicines with your doctor. Some medicines can make you feel dizzy. This can increase your chance of falling. Ask your doctor what other things that you can do to help prevent falls. This information is not intended to replace advice given to you by your health care provider. Make sure you discuss any questions you have with your health care provider. Document Released: 11/05/2008 Document Revised: 06/17/2015 Document Reviewed: 02/13/2014 Elsevier Interactive Patient Education  2017 [employer].

## 2023-01-09 NOTE — Progress Notes (Signed)
Subjective:   Lauren Chapman is a 52 y.o. female who presents for Medicare Annual (Subsequent) preventive examination.  Visit Complete: Virtual I connected with  [name] on 01/09/23 by a audio enabled telemedicine application and verified that I am speaking with the correct person using two identifiers.  Patient Location: Home  Provider Location: Home Office  I discussed the limitations of evaluation and management by telemedicine. The patient expressed understanding and agreed to proceed.  Vital Signs: Because this visit was a virtual/telehealth visit, some criteria may be missing or patient reported. Any vitals not documented were not able to be obtained and vitals that have been documented are patient reported. Cardiac Risk Factors include: advanced age (>72men, >25 women);sedentary lifestyle     Objective:    There were no vitals filed for this visit. There is no height or weight on file to calculate BMI.     01/09/2023   11:35 AM 09/18/2022    9:21 AM 09/07/2022    3:50 PM 09/01/2022   12:17 PM 11/10/2021    9:51 AM 11/02/2021    8:54 AM 08/15/2021    3:17 PM  Advanced Directives  Does Patient Have a Medical Advance Directive? No No No No No No No  Would patient like information on creating a medical advance directive?   No - Patient declined  Yes (MAU/Ambulatory/Procedural Areas - Information given) No - Patient declined     Current Medications (verified) Outpatient Encounter Medications as of 01/09/2023  Medication Sig   albuterol (VENTOLIN HFA) 108 (90 Base) MCG/ACT inhaler Inhale 2 puffs into the lungs every 4 (four) hours as needed for wheezing or shortness of breath.   ALPRAZolam (XANAX) 1 MG tablet Take 1 mg by mouth 2 (two) times daily as needed.   amoxicillin (AMOXIL) 500 MG capsule Take 500 mg by mouth every 8 (eight) hours.   ARIPiprazole (ABILIFY) 30 MG tablet Take 30 mg by mouth at bedtime.   benztropine (COGENTIN) 0.5 MG tablet Take by mouth.    budesonide-formoterol (SYMBICORT) 160-4.5 MCG/ACT inhaler Inhale 2 puffs into the lungs 2 (two) times daily.   buPROPion (WELLBUTRIN XL) 150 MG 24 hr tablet Take 150 mg by mouth every morning.   busPIRone (BUSPAR) 15 MG tablet Take 15 mg by mouth 3 (three) times daily.   citalopram (CELEXA) 20 MG tablet Take 30 mg by mouth daily.   diclofenac (VOLTAREN) 75 MG EC tablet Take 1 tablet (75 mg total) by mouth 2 (two) times daily.   EPINEPHrine 0.3 mg/0.3 mL IJ SOAJ injection Inject 0.3 mg into the muscle as needed for anaphylaxis.   esomeprazole (NEXIUM) 20 MG capsule Take 1 capsule (20 mg total) by mouth 2 (two) times daily before a meal.   gabapentin (NEURONTIN) 800 MG tablet Take 800 mg by mouth 3 (three) times daily.    ibuprofen (ADVIL) 800 MG tablet Take 800 mg by mouth every 8 (eight) hours as needed.   lamoTRIgine (LAMICTAL) 200 MG tablet Take 200 mg by mouth 2 (two) times daily.   MYRBETRIQ 50 MG TB24 tablet Take 50 mg by mouth daily.   ondansetron (ZOFRAN) 4 MG tablet Take 1 tablet (4 mg total) by mouth every 8 (eight) hours as needed for nausea or vomiting.   predniSONE (DELTASONE) 10 MG tablet 12 day tapering dose   Rimegepant Sulfate (NURTEC) 75 MG TBDP Take 1 tablet (75 mg total) by mouth daily as needed.   SUMAtriptan (IMITREX) 6 MG/0.5ML SOLN injection Inject 0.5  mLs (6 mg total) into the skin See admin instructions. INJECT 6 MG (0.5 ml) INTO THE SKIN AS NEEDED FOR MIGRAINE OR HEADACHE. MAY REPEAT ONCE IN 2 HOURS IF HEADACHE PERSISTS OR RECURS. MAX 2 DOSES IN 24 HOURS.   thiothixene (NAVANE) 5 MG capsule Take 5 mg by mouth 3 (three) times daily.   tiZANidine (ZANAFLEX) 4 MG tablet Take 1 tablet (4 mg total) by mouth every 6 (six) hours as needed for muscle spasms.   traMADol (ULTRAM) 50 MG tablet Take 50 mg by mouth every 6 (six) hours as needed.   No facility-administered encounter medications on file as of 01/09/2023.    Allergies (verified) Codeine   History: Past Medical  History:  Diagnosis Date   Allergy    Anxiety    Bipolar affective psychosis (HCC)    Complication of anesthesia    " Acting silly- waving at everyone"-Giddy   Depression    Emphysema lung (HCC)    inhaler prn,  followed by pcp and pulmonologist-- dr [name]   Family history of adverse reaction to anesthesia    Father had rheumatic fever, and died on operating table.   GERD (gastroesophageal reflux disease)    Kidney cysts 01/24/2020   Right Kidney   Migraine    Moderate persistent asthma 09/05/2021   Smokers' cough (HCC)    per pt not productive   SUI (stress urinary incontinence, female)    Tubular adenoma of colon 07/08/2020   Wears glasses    Past Surgical History:  Procedure Laterality Date   ABDOMINAL HYSTERECTOMY N/A    [facility]reesia 11/30/2019   CARPAL TUNNEL RELEASE Right 09-12-2007   @[facility]    and GANGLION CYST EXCISION   COLONOSCOPY     CYSTOSCOPY N/A 04/29/2019   Procedure: CYSTOSCOPY;  Surgeon: [name], MD;  Location: [facility];  Service: Gynecology;  Laterality: N/A;   DILATATION & CURETTAGE/HYSTEROSCOPY WITH MYOSURE N/A 11/13/2018   Procedure: DILATATION & CURETTAGE/HYSTEROSCOPY WITH MYOSURE;  Surgeon: [name], MD;  Location:  [facility];  Service: Gynecology;  Laterality: N/A;  request 9:00am OR start time in [place] [facility] block requests one hour   DILATION AND CURETTAGE OF UTERUS  02/2019   EYE SURGERY N/A    [facility]ia 11/30/2019   GANGLION CYST EXCISION     R wrist   interstem      implant for overactive bladder   KNEE ARTHROSCOPY WITH ANTERIOR CRUCIATE LIGAMENT (ACL) REPAIR Right 10/2015   LAPAROSCOPIC CHOLECYSTECTOMY  1997   LAPAROSCOPIC HYSTERECTOMY Bilateral 04/29/2019   Procedure: HYSTERECTOMY TOTAL LAPAROSCOPIC, BILATERAL SALPINO-OOPHORECTOMY;  Surgeon: [name], MD;  Location: [facility] ;  Service: Gynecology;  Laterality: Bilateral;   LAPAROSCOPIC TOTAL HYSTERECTOMY     TOOTH  EXTRACTION  04/01/2019   tooth removal Bilateral 2022   4 teeth removed   UPPER GASTROINTESTINAL ENDOSCOPY     Family History  Problem Relation Age of Onset   Depression Mother    Cancer Mother        Cervical   Alcohol abuse Father    Crohn's disease Sister    Stroke Brother 19   Migraines Daughter    GER disease Daughter    Depression Daughter    Migraines Daughter    GER disease Daughter    Colon cancer Neg Hx    Rectal cancer Neg Hx    Stomach cancer Neg Hx    Social History   Socioeconomic History   Marital status: Divorced  Spouse name: n/a   Number of children: 2   Years of education: 60   Highest education level: Not on file  Occupational History   Occupation: Disabled    Comment: Formerly a Runner, broadcasting/film/video.  Tobacco Use   Smoking status: Former    Current packs/day: 0.00    Average packs/day: 1 pack/day for 39.8 years (39.8 ttl pk-yrs)    Types: Cigarettes    Start date: 10/10/1981    Quit date: 07/18/2021    Years since quitting: 1.4    Passive exposure: Never   Smokeless tobacco: Never  Vaping Use   Vaping status: Never Used  Substance and Sexual Activity   Alcohol use: Never    Alcohol/week: 0.0 standard drinks of alcohol   Drug use: Never   Sexual activity: Not Currently    Partners: Male    Birth control/protection: Post-menopausal    Comment: 1st intercourse 52 yo-More than 5 partners  Other Topics Concern   Not on file  Social History Narrative   Lives at home with 1 of her two daughters   Right-handed.   2-4 cups caffeine daily.   Disabled    One story home   Social Drivers of Health   Financial Resource Strain: Low Risk  (01/09/2023)   Overall Financial Resource Strain (CARDIA)    Difficulty of Paying Living Expenses: Not very hard  Food Insecurity: Food Insecurity Present (01/09/2023)   Hunger Vital Sign    Worried About Running Out of Food in the Last Year: Sometimes true    Ran Out of Food in the Last Year: Sometimes true   Transportation Needs: No Transportation Needs (01/09/2023)   PRAPARE - Administrator, Civil Service (Medical): No    Lack of Transportation (Non-Medical): No  Physical Activity: Inactive (01/09/2023)   Exercise Vital Sign    Days of Exercise per Week: 0 days    Minutes of Exercise per Session: 0 min  Stress: Stress Concern Present (01/09/2023)   [employer] of Occupational Health - Occupational Stress Questionnaire    Feeling of Stress : Rather much  Social Connections: Socially Isolated (01/09/2023)   Social Connection and Isolation Panel [NHANES]    Frequency of Communication with Friends and Family: Never    Frequency of Social Gatherings with Friends and Family: Never    Attends Religious Services: Never    Database administrator or Organizations: No    Attends Engineer, structural: Never    Marital Status: Divorced    Tobacco Counseling Counseling given: Not Answered   Clinical Intake:  Pre-visit preparation completed: Yes  Pain : No/denies pain     Diabetes: No  How often do you need to have someone help you when you read instructions, pamphlets, or other written materials from your doctor or pharmacy?: 1 - Never  Interpreter Needed?: No  Information entered by :: Remi Haggard LPN   Activities of Daily Living    01/09/2023   11:34 AM  In your present state of health, do you have any difficulty performing the following activities:  Hearing? 0  Vision? 0  Difficulty concentrating or making decisions? 0  Walking or climbing stairs? 0  Dressing or bathing? 0  Doing errands, shopping? 0  Preparing Food and eating ? N  Using the Toilet? N  In the past six months, have you accidently leaked urine? Y  Do you have problems with loss of bowel control? N  Managing your Medications? N  Managing your  Finances? N  Housekeeping or managing your Housekeeping? N    Patient Care Team: Shade Flood, MD as PCP - General (Family  Medicine) Marcello Fennel, MD as Consulting Physician (Physical Medicine and Rehabilitation) Tanda Rockers, NP as Nurse Practitioner (Radiology) Drema Dallas, DO as Consulting Physician (Neurology)  Indicate any recent Medical Services you may have received from other than [facility] providers in the past year (date may be approximate).     Assessment:   This is a routine wellness examination for Akari.  Hearing/Vision screen Hearing Screening - Comments:: No trouble hearing Vision Screening - Comments:: Up to date [facility]   Goals Addressed             This Visit's Progress    Weight (lb) < 200 lb (90.7 kg)         Depression Screen    01/09/2023   11:39 AM 09/11/2022   11:44 AM 08/18/2022   10:20 AM 07/07/2022    8:53 AM 05/10/2022   11:14 AM 12/22/2021    2:16 PM 11/10/2021    9:49 AM  PHQ 2/9 Scores  PHQ - 2 Score 5 3 3 4 3 1 2   PHQ- 9 Score 12 16 11 14 6 5      Fall Risk    01/09/2023   11:33 AM 10/19/2022    9:23 AM 09/18/2022    9:21 AM 09/11/2022   11:44 AM 08/18/2022   10:20 AM  Fall Risk   Falls in the past year? 0 1 0 0 1  Number falls in past yr: 0  0 0 0  Injury with Fall? 0  0 0 0  Risk for fall due to :    No Fall Risks No Fall Risks  Follow up Falls evaluation completed;Education provided;Falls prevention discussed  Falls evaluation completed Falls evaluation completed Falls evaluation completed    MEDICARE RISK AT HOME: Medicare Risk at Home Any stairs in or around the home?: Yes If so, are there any without handrails?: No Home free of loose throw rugs in walkways, pet beds, electrical cords, etc?: Yes Adequate lighting in your home to reduce risk of falls?: Yes Life alert?: No Use of a cane, walker or w/c?: No Grab bars in the bathroom?: No Shower chair or bench in shower?: No Elevated toilet seat or a handicapped toilet?: No  TIMED UP AND GO:  Was the test performed?  No    Cognitive Function:        01/09/2023   11:35 AM  11/02/2021    8:54 AM 03/17/2019    8:36 AM  6CIT Screen  What Year? 0 points 0 points 0 points  What month? 0 points 0 points 0 points  What time? 0 points 0 points 0 points  Count back from 20 0 points 0 points 0 points  Months in reverse 0 points 0 points 0 points  Repeat phrase 0 points 0 points 0 points  Total Score 0 points 0 points 0 points    Immunizations Immunization History  Administered Date(s) Administered   Fluad Quad(high Dose 65+) 01/04/2023   Influenza Inj Mdck Quad Pf 01/03/2019   Influenza Split 10/11/2011   Influenza,inj,Quad PF,6+ Mos 12/04/2017, 12/01/2019, 11/07/2021   Influenza-Unspecified 01/03/2019   PFIZER(Purple Top)SARS-COV-2 Vaccination 04/19/2019, 05/10/2019, 01/22/2020, 10/26/2020, 11/07/2021, 01/04/2023   PNEUMOCOCCAL CONJUGATE-20 08/02/2020   Pfizer Covid-19 Vaccine Bivalent Booster 59yrs & up 05/15/2020   Tdap 10/16/2018   Zoster Recombinant(Shingrix) 05/31/2020, 08/02/2020    TDAP status:  Up to date  Flu Vaccine status: Up to date   Covid-19 vaccine status: Completed vaccines  Qualifies for Shingles Vaccine? No   Zostavax completed Yes   Shingrix Completed?: No.    Education has been provided regarding the importance of this vaccine. Patient has been advised to call insurance company to determine out of pocket expense if they have not yet received this vaccine. Advised may also receive vaccine at local pharmacy or Health Dept. Verbalized acceptance and understanding.  Screening Tests Health Maintenance  Topic Date Due   COVID-19 Vaccine (8 - 2024-25 season) 03/01/2023   MAMMOGRAM  03/14/2023   Fecal DNA (Cologuard)  07/06/2023   Lung Cancer Screening  09/09/2023   Medicare Annual Wellness (AWV)  01/09/2024   DTaP/Tdap/Td (2 - Td or Tdap) 10/15/2028   Colonoscopy  07/09/2030   INFLUENZA VACCINE  Completed   Hepatitis C Screening  Completed   HIV Screening  Completed   Zoster Vaccines- Shingrix  Completed   HPV VACCINES  Aged Out     Health Maintenance  There are no preventive care reminders to display for this patient.   Colorectal cancer screening: Type of screening: Colonoscopy. Completed  . Repeat every 3 years  Mammogram status: Completed  . Repeat every year    Lung Cancer Screening: (Low Dose CT Chest recommended if Age 23-80 years, 20 pack-year currently smoking OR have quit w/in 15years.) does qualify.   Lung Cancer Screening Referral: due 08-2023  Additional Screening:  Hepatitis C Screening: does not qualify; Completed 2022  Vision Screening: Recommended annual ophthalmology exams for early detection of glaucoma and other disorders of the eye. Is the patient up to date with their annual eye exam?  Yes  Who is the provider or what is the name of the office in which the patient attends annual eye exams? [facility] If pt is not established with a provider, would they like to be referred to a provider to establish care? No .   Dental Screening: Recommended annual dental exams for proper oral hygiene    Community Resource Referral / Chronic Care Management: CRR required this visit?  No   CCM required this visit?  No     Plan:     I have personally reviewed and noted the following in the patient's chart:   Medical and social history Use of alcohol, tobacco or illicit drugs  Current medications and supplements including opioid prescriptions. Patient is currently taking opioid prescriptions. Information provided to patient regarding non-opioid alternatives. Patient advised to discuss non-opioid treatment plan with their provider. Functional ability and status Nutritional status Physical activity Advanced directives List of other physicians Hospitalizations, surgeries, and ER visits in previous 12 months Vitals Screenings to include cognitive, depression, and falls Referrals and appointments  In addition, I have reviewed and discussed with patient certain preventive protocols, quality  metrics, and best practice recommendations. A written personalized care plan for preventive services as well as general preventive health recommendations were provided to patient.     Remi Haggard, LPN   [id number]   After Visit Summary: (MyChart) Due to this being a telephonic visit, the after visit summary with patients personalized plan was offered to patient via MyChart   Nurse Notes:

## 2023-01-15 ENCOUNTER — Encounter: Payer: 59 | Admitting: Family Medicine

## 2023-01-18 ENCOUNTER — Emergency Department (HOSPITAL_BASED_OUTPATIENT_CLINIC_OR_DEPARTMENT_OTHER): Payer: 59

## 2023-01-18 ENCOUNTER — Other Ambulatory Visit: Payer: Self-pay

## 2023-01-18 ENCOUNTER — Emergency Department (HOSPITAL_BASED_OUTPATIENT_CLINIC_OR_DEPARTMENT_OTHER)
Admission: EM | Admit: 2023-01-18 | Discharge: 2023-01-18 | Disposition: A | Discharge status: Home / Self Care | Payer: 59

## 2023-01-18 DIAGNOSIS — J449 Chronic obstructive pulmonary disease, unspecified: Secondary | ICD-10-CM | POA: Diagnosis not present

## 2023-01-18 DIAGNOSIS — Z720 Tobacco use: Secondary | ICD-10-CM | POA: Diagnosis not present

## 2023-01-18 DIAGNOSIS — Z1152 Encounter for screening for COVID-19: Secondary | ICD-10-CM | POA: Insufficient documentation

## 2023-01-18 DIAGNOSIS — R0602 Shortness of breath: Secondary | ICD-10-CM | POA: Diagnosis present

## 2023-01-18 DIAGNOSIS — R0789 Other chest pain: Secondary | ICD-10-CM | POA: Insufficient documentation

## 2023-01-18 LAB — BASIC METABOLIC PANEL
Anion gap: 8 (ref 5–15)
BUN: 10 mg/dL (ref 6–20)
CO2: 28 mmol/L (ref 22–32)
Calcium: 9.2 mg/dL (ref 8.9–10.3)
Chloride: 105 mmol/L (ref 98–111)
Creatinine, Ser: 1.01 mg/dL — ABNORMAL HIGH (ref 0.44–1.00)
GFR, Estimated: >60 mL/min (ref >60)
Glucose, Bld: 99 mg/dL (ref 70–99)
Potassium: 3.4 mmol/L — ABNORMAL LOW (ref 3.5–5.1)
Sodium: 141 mmol/L (ref 135–145)

## 2023-01-18 LAB — CBC
HCT: 34.8 % — ABNORMAL LOW (ref 36.0–46.0)
Hemoglobin: 11.2 g/dL — ABNORMAL LOW (ref 12.0–15.0)
MCH: 26.7 pg (ref 26.0–34.0)
MCHC: 32.2 g/dL (ref 30.0–36.0)
MCV: 83.1 fL (ref 80.0–100.0)
Platelets: 283 10*3/uL (ref 150–400)
RBC: 4.19 MIL/uL (ref 3.87–5.11)
RDW: 15.1 % (ref 11.5–15.5)
WBC: 7.6 10*3/uL (ref 4.0–10.5)
nRBC: 0 % (ref 0.0–0.2)

## 2023-01-18 LAB — RESP PANEL BY RT-PCR (RSV, FLU A&B, COVID)  RVPGX2
Influenza A by PCR: NEGATIVE
Influenza B by PCR: NEGATIVE
Resp Syncytial Virus by PCR: NEGATIVE
SARS Coronavirus 2 by RT PCR: NEGATIVE

## 2023-01-18 LAB — D-DIMER, QUANTITATIVE: D-Dimer, Quant: 2.23 ug{FEU}/mL — ABNORMAL HIGH (ref 0.00–0.50)

## 2023-01-18 LAB — TROPONIN I (HIGH SENSITIVITY)
Troponin I (High Sensitivity): 2 ng/L (ref <18)
Troponin I (High Sensitivity): 2 ng/L (ref <18)

## 2023-01-18 MED ORDER — IPRATROPIUM-ALBUTEROL 0.5-2.5 (3) MG/3ML IN SOLN
3.0000 mL | Freq: Once | RESPIRATORY_TRACT | Status: AC
Start: 2023-01-18 — End: 2023-01-18
  Administered 2023-01-18: 3 mL via RESPIRATORY_TRACT
  Filled 2023-01-18: qty 3

## 2023-01-18 MED ORDER — PREDNISONE 50 MG PO TABS
60.0000 mg | ORAL_TABLET | Freq: Once | ORAL | Status: AC
  Administered 2023-01-18: 60 mg via ORAL
  Filled 2023-01-18: qty 1

## 2023-01-18 MED ORDER — ALBUTEROL SULFATE HFA 108 (90 BASE) MCG/ACT IN AERS
2.0000 | INHALATION_SPRAY | RESPIRATORY_TRACT | Status: DC | PRN
  Administered 2023-01-18: 2 via RESPIRATORY_TRACT
  Filled 2023-01-18: qty 6.7

## 2023-01-18 MED ORDER — IOHEXOL 350 MG/ML SOLN
100.0000 mL | Freq: Once | INTRAVENOUS | Status: AC | PRN
  Administered 2023-01-18: 75 mL via INTRAVENOUS

## 2023-01-18 NOTE — ED Provider Notes (Signed)
SOB started last night. History of COPD without improvement.  Will follow up on CTA, cxr, BNP, trop Physical Exam  BP 125/67   Pulse 63   Temp 98.3 F (36.8 C) (Oral)   Resp 14   Ht 5\' 3"  (1.6 m)   Wt 93.9 kg   LMP 05/02/2019 Comment: irregular cycles  SpO2 99%   BMI 36.67 kg/m   Physical Exam Vitals and nursing note reviewed.  Constitutional:      General: She is not in acute distress.    Appearance: She is well-developed.  HENT:     Head: Normocephalic and atraumatic.  Eyes:     Conjunctiva/sclera: Conjunctivae normal.  Cardiovascular:     Rate and Rhythm: Normal rate and regular rhythm.     Heart sounds: No murmur heard. Pulmonary:     Effort: Pulmonary effort is normal. No respiratory distress.     Breath sounds: Normal breath sounds.  Abdominal:     Palpations: Abdomen is soft.     Tenderness: There is no abdominal tenderness.  Musculoskeletal:        General: No swelling.     Cervical back: Neck supple.     Comments: No lower extremity edema bilaterally  Skin:    General: Skin is warm and dry.     Capillary Refill: Capillary refill takes less than 2 seconds.  Neurological:     Mental Status: She is alert.  Psychiatric:        Mood and Affect: Mood normal.     Procedures  Procedures  ED Course / MDM    Medical Decision Making Amount and/or Complexity of Data Reviewed Labs: ordered. Radiology: ordered.  Risk Prescription drug management.   Patient received as signout.  Please see prior note for full details.  In short, patient presents with concern for feeling more short of breath over the past day.  Denies any other associated symptoms like fever, chills, cough, or chest pain.  Denies any lower extremity swelling or pain.  Work appears reassuring.  Her troponin is 2 initially, and 2 on the repeat.  EKG with normal sinus rhythm.  Flu, COVID, RSV negative.  Chest x-ray unremarkable.  CBC and BMP unremarkable. D-dimer was obtained by previous  provider and it was elevated at 2.23.  CTA chest was obtained to rule out PE.  This was negative for PE.  Patient is breathing comfortably at 99% on room air upon my examination.  Talking full sentences.  Vital signs stable. Given reassuring labs and imaging, low concern for any emergent etiology at this time. Appropriate for discharge home at this time with instructions to follow up with PCP within the next week for symptom recheck and repeat hemoglobin as it was slightly low at 11.2.  Return precautions given.       Lauren Merles, PA-C 01/18/23 2209    Durwin Glaze, MD 01/18/23 2329

## 2023-01-18 NOTE — ED Triage Notes (Signed)
Pt POV reporting SOB since last night, worse today. Hx emphysema, used inhaler at home w no improvement. Labored in triage, SPO2 100%.

## 2023-01-18 NOTE — ED Notes (Signed)
Report given to the next RN.Marland KitchenMarland Kitchen

## 2023-01-18 NOTE — Discharge Instructions (Addendum)
Your workup today is reassuring.   Your cardiac enzyme (troponin) was normal today. Your EKG which is a measure of the heart's electrical activity and rhythm is normal today.   Your chest x-ray is normal today.  The CT of your chest does not show any blood clot in your lungs.  Your COVID, flu, RSV is negative.  Your hemoglobin is slightly low today at 11.2 (normal between 12 and 15).  Please follow-up with your PCP within the next 2 weeks to have this repeated and for further management of your symptoms.  Return to the ER if you have any difficulty breathing, worsening chest pain, dizziness, jaw pain, left arm or shoulder pain, abdominal pain, fever, any other new or concerning symptoms

## 2023-01-18 NOTE — ED Provider Notes (Signed)
[place] EMERGENCY DEPARTMENT AT [facility] Provider Note   CSN: [id number] Arrival date & time: 01/18/23  1733     History  No chief complaint on file.   Lauren Chapman is a 52 y.o. female with past medical history of bipolar disorder, GERD, chronic migraines, emphysema, obstructive sleep apnea, tobacco use presenting to emergency room with shortness of breath.  Patient reports that she started feeling short of breath last night has gotten slightly worse today.  Patient has history of COPD and has been using her inhalers at home without any treatment of symptoms.  Patient reports she gets more short of breath when she is walking around. Endorses mild chest tightness when taking a deep breath in. Denies any fevers chills cough URI-like symptoms. Not on blood thinner, no history of PE or DVT.   HPI     Home Medications Prior to Admission medications   Medication Sig Start Date End Date Taking? Authorizing Provider  albuterol (VENTOLIN HFA) 108 (90 Base) MCG/ACT inhaler Inhale 2 puffs into the lungs every 4 (four) hours as needed for wheezing or shortness of breath. 07/18/22   Cobb, Ruby Cola, NP  ALPRAZolam Prudy Feeler) 1 MG tablet Take 1 mg by mouth 2 (two) times daily as needed. 08/21/22   [provider]  amoxicillin (AMOXIL) 500 MG capsule Take 500 mg by mouth every 8 (eight) hours. 01/08/23   [provider]  ARIPiprazole (ABILIFY) 30 MG tablet Take 30 mg by mouth at bedtime. 12/23/20   [provider]  benztropine (COGENTIN) 0.5 MG tablet Take by mouth. 12/07/21   [provider]  budesonide-formoterol (SYMBICORT) 160-4.5 MCG/ACT inhaler Inhale 2 puffs into the lungs 2 (two) times daily. 07/18/22   Cobb, Ruby Cola, NP  buPROPion (WELLBUTRIN XL) 150 MG 24 hr tablet Take 150 mg by mouth every morning. 04/06/22   [provider]  busPIRone (BUSPAR) 15 MG tablet Take 15 mg by mouth 3 (three) times daily. 07/30/20   [provider]  citalopram (CELEXA) 20 MG tablet Take 30 mg by mouth daily.    [provider]  diclofenac (VOLTAREN) 75 MG EC tablet Take 1 tablet (75 mg total) by mouth 2 (two) times daily. 03/29/22   Lenn Sink, DPM  EPINEPHrine 0.3 mg/0.3 mL IJ SOAJ injection Inject 0.3 mg into the muscle as needed for anaphylaxis. 03/29/21   Al Decant, PA-C  esomeprazole (NEXIUM) 20 MG capsule Take 1 capsule (20 mg total) by mouth 2 (two) times daily before a meal. 03/21/19   Lezlie Lye, Meda Coffee, MD  gabapentin (NEURONTIN) 800 MG tablet Take 800 mg by mouth 3 (three) times daily.  07/02/18   [provider]  ibuprofen (ADVIL) 800 MG tablet Take 800 mg by mouth every 8 (eight) hours as needed. 01/08/23   [provider]  lamoTRIgine (LAMICTAL) 200 MG tablet Take 200 mg by mouth 2 (two) times daily. 12/20/21   [provider]  MYRBETRIQ 50 MG TB24 tablet Take 50 mg by mouth daily. 11/11/21   [provider]  ondansetron (ZOFRAN) 4 MG tablet Take 1 tablet (4 mg total) by mouth every 8 (eight) hours as needed for nausea or vomiting. 01/05/20   Glean Salvo, NP  predniSONE (DELTASONE) 10 MG tablet 12 day tapering dose 11/29/22   Lenn Sink, DPM  Rimegepant Sulfate (NURTEC) 75 MG TBDP Take 1 tablet (75 mg total) by mouth daily as needed. 09/18/22   Drema Dallas, DO  SUMAtriptan (IMITREX) 6 MG/0.5ML SOLN injection Inject 0.5 mLs (6 mg total) into the skin See admin instructions. INJECT 6 MG (0.5 ml) INTO THE SKIN AS NEEDED FOR MIGRAINE OR HEADACHE. MAY REPEAT ONCE IN 2 HOURS IF HEADACHE PERSISTS OR RECURS. MAX 2 DOSES IN 24 HOURS. 02/17/22   Drema Dallas, DO  thiothixene (NAVANE) 5 MG capsule Take 5 mg by mouth 3 (three) times daily. 04/07/22   [provider]  tiZANidine (ZANAFLEX) 4 MG tablet Take 1 tablet (4 mg total) by mouth every 6 (six) hours as needed for muscle spasms. 08/15/21   Drema Dallas, DO  traMADol (ULTRAM) 50 MG tablet Take 50 mg by mouth every  6 (six) hours as needed.    [provider]      Allergies    Codeine    Review of Systems   Review of Systems  Respiratory:  Positive for shortness of breath.     Physical Exam Updated Vital Signs BP 124/76   Pulse (!) 51   Resp 16   Ht 5\' 3"  (1.6 m)   Wt 93.9 kg   LMP 05/02/2019 Comment: irregular cycles  SpO2 100%   BMI 36.67 kg/m  Physical Exam Vitals and nursing note reviewed.  Constitutional:      General: She is not in acute distress.    Appearance: She is not toxic-appearing.  HENT:     Head: Normocephalic and atraumatic.  Eyes:     General: No scleral icterus.    Conjunctiva/sclera: Conjunctivae normal.  Cardiovascular:     Rate and Rhythm: Normal rate and regular rhythm.     Pulses: Normal pulses.     Heart sounds: Normal heart sounds.  Pulmonary:     Effort: Pulmonary effort is normal. No respiratory distress.     Breath sounds: Normal breath sounds. No stridor. No wheezing, rhonchi or rales.     Comments: No acute distress  Chest:     Chest wall: No tenderness.  Abdominal:     General: Abdomen is flat. Bowel sounds are normal.     Palpations: Abdomen is soft.     Tenderness: There is no abdominal tenderness.  Skin:    General: Skin is warm and dry.     Findings: No lesion.  Neurological:     General: No focal deficit present.     Mental Status: She is alert and oriented to person, place, and time. Mental status is at baseline.     ED Results / Procedures / Treatments   Labs (all labs ordered are listed, but only abnormal results are displayed) Labs Reviewed  D-DIMER, QUANTITATIVE - Abnormal; Notable for the following components:      Result Value   D-Dimer, Quant 2.23 (*)    All other components within normal limits  CBC - Abnormal; Notable for the following components:   Hemoglobin 11.2 (*)    HCT 34.8 (*)    All other components within normal limits  BASIC METABOLIC PANEL - Abnormal; Notable for the following components:    Potassium 3.4 (*)    Creatinine, Ser 1.01 (*)    All other components within normal limits  RESP PANEL BY RT-PCR (RSV, FLU A&B, COVID)  RVPGX2  TROPONIN I (HIGH SENSITIVITY)  TROPONIN I (HIGH SENSITIVITY)    EKG EKG Interpretation Date/Time:  Thursday January 18 2023 17:46:13 EST Ventricular Rate:  76 PR Interval:    QRS Duration:  82 QT Interval:  412 QTC Calculation: 463 R Axis:  30  Text Interpretation: Normal sinus rhythm Abnormal ECG When compared with ECG of 28-Sep-2022 08:25, Confirmed by Beckey Downing [phone]) on 01/18/2023 6:08:56 PM  Radiology No results found.  Procedures Procedures    Medications Ordered in ED Medications  albuterol (VENTOLIN HFA) 108 (90 Base) MCG/ACT inhaler 2 puff (2 puffs Inhalation Given 01/18/23 1752)  predniSONE (DELTASONE) tablet 60 mg (has no administration in time range)  ipratropium-albuterol (DUONEB) 0.5-2.5 (3) MG/3ML nebulizer solution 3 mL (has no administration in time range)    ED Course/ Medical Decision Making/ A&P                                 Medical Decision Making Amount and/or Complexity of Data Reviewed Labs: ordered. Radiology: ordered.  Risk Prescription drug management.   Lauren Chapman 52 y.o. presented today for shortness of breath.  Working DDx that I considered at this time includes, but not limited to, asthma/COPD exacerbation, URI, viral illness, anemia, ACS, PE, pneumonia, pleural effusion, lung mass.  R/o DDx: These are considered less likely due to history of present illness, physical exam, labs/imaging findings  Pmhx: bipolar disorder, GERD, chronic migraines, emphysema, obstructive sleep apnea, tobacco  Review of prior external notes: 12/13/22  Unique Tests and My Interpretation:  CBC: no leukocytosis, no anemia  BMP: pending  Dimer: 2.23, ordering CTA to rule out PE which is pending at time of sign off EKG: sinus  Troponin: pending. Does not need repeat due to duration of symptoms. CXR:  pending   Respiratory Panel: pending  Problem List / ED Course / Critical interventions / Medication management  Patient reporting to emergency room with shortness of breath.  Patient does have history of COPD however no wheezing on exam and shortness of breath is not improved after inhalers at home, will obtain labs.  Denies cough or URI like symptoms. Will give steroids and DuoNeb here monitor closely.  No signs of acute distress no hypoxia.  Getting D-dimer to rule out pulmonary embolism, although no signs of DVT. On exam patient does not appear fluid overloaded.  Doubt this is ACS due to symptoms and presentation, however obtaining EKG and trop to rule out.  I ordered medication including duo neb, steroids  Reevaluation of the patient after these medicines showed that the patient stayed the same Patients vitals assessed. Upon arrival patient is hemodynamically stable.  I have reviewed the patients home medicines and have made adjustments as needed   Plan:  Signed off to [facility] pending CTA         Final Clinical Impression(s) / ED Diagnoses Final diagnoses:  None    Rx / DC Orders ED Discharge Orders     None         Smitty Knudsen, PA-C 01/19/23 0915    Durwin Glaze, MD 01/19/23 2156

## 2023-01-18 NOTE — ED Notes (Signed)
 RN reviewed discharge instructions with pt. Pt verbalized understanding and had no further questions. VSS upon discharge.  

## 2023-01-29 ENCOUNTER — Ambulatory Visit: Payer: 59 | Admitting: Radiology

## 2023-02-07 ENCOUNTER — Encounter: Payer: Self-pay | Admitting: Neurology

## 2023-02-12 ENCOUNTER — Telehealth: Payer: Self-pay

## 2023-02-12 NOTE — Telephone Encounter (Signed)
Auth Submission: APPROVED - Renewal Site of care: Site of care: [facility] Payer: [employer] Dual complete Medication & CPT/J Code(s) submitted: Vyepti (Eptinezumab) [id number] Route of submission (phone, fax, portal): portal Phone # Fax # Auth type: Buy/Bill PB Units/visits requested: 300mg  x 4 doses Reference number: [id number] Approval from: 02/07/23 to 02/07/24

## 2023-02-14 ENCOUNTER — Ambulatory Visit: Payer: 59 | Admitting: Radiology

## 2023-02-15 DIAGNOSIS — N3281 Overactive bladder: Secondary | ICD-10-CM | POA: Insufficient documentation

## 2023-02-15 DIAGNOSIS — G43E11 Chronic migraine with aura, intractable, with status migrainosus: Secondary | ICD-10-CM | POA: Insufficient documentation

## 2023-02-15 DIAGNOSIS — J45909 Unspecified asthma, uncomplicated: Secondary | ICD-10-CM | POA: Insufficient documentation

## 2023-02-23 ENCOUNTER — Encounter: Payer: Self-pay | Admitting: Neurology

## 2023-02-28 ENCOUNTER — Telehealth: Payer: Self-pay | Admitting: *Deleted

## 2023-02-28 NOTE — Transitions of Care (Post Inpatient/ED Visit) (Signed)
   02/28/2023  Name: Lauren Chapman MRN: [id number] DOB: 06/18/1970  Today's TOC FU Call Status: Today's TOC FU Call Status:: Unsuccessful Call (1st Attempt) Unsuccessful Call (1st Attempt) Date: 02/28/23  Attempted to reach the patient regarding the most recent Inpatient visit; left HIPAA compliant voice message requesting call back  Follow Up Plan: Additional outreach attempts will be made to reach the patient to complete the Transitions of Care (Post Inpatient visit) call.   Freeman Borba Mckinney Leighana Neyman, RN, BSN, Media Planner  Transitions of Care  [facility] [phone]: direct office

## 2023-03-01 ENCOUNTER — Telehealth: Payer: Self-pay | Admitting: *Deleted

## 2023-03-01 ENCOUNTER — Other Ambulatory Visit (HOSPITAL_COMMUNITY): Payer: 59

## 2023-03-01 DIAGNOSIS — R45851 Suicidal ideations: Secondary | ICD-10-CM | POA: Insufficient documentation

## 2023-03-01 DIAGNOSIS — F250 Schizoaffective disorder, bipolar type: Secondary | ICD-10-CM | POA: Insufficient documentation

## 2023-03-01 DIAGNOSIS — F333 Major depressive disorder, recurrent, severe with psychotic symptoms: Secondary | ICD-10-CM | POA: Insufficient documentation

## 2023-03-01 DIAGNOSIS — R4589 Other symptoms and signs involving emotional state: Secondary | ICD-10-CM | POA: Insufficient documentation

## 2023-03-01 DIAGNOSIS — F4310 Post-traumatic stress disorder, unspecified: Secondary | ICD-10-CM | POA: Insufficient documentation

## 2023-03-01 DIAGNOSIS — F411 Generalized anxiety disorder: Secondary | ICD-10-CM | POA: Insufficient documentation

## 2023-03-01 DIAGNOSIS — Z79899 Other long term (current) drug therapy: Secondary | ICD-10-CM | POA: Insufficient documentation

## 2023-03-01 DIAGNOSIS — F259 Schizoaffective disorder, unspecified: Secondary | ICD-10-CM | POA: Insufficient documentation

## 2023-03-01 NOTE — Progress Notes (Signed)
   03/01/23 1109  Step One: Remove Access  What things should I remove or limit my access to? Get rid of pills I don't need, keep only quantities that are not dangerous  What are some ways I can get rid of pills? My doctor, pharmacist, or other healthcare professionals can advise me.;Have someone else hold my pills  What might I use to hurt myself? Medicine, knives, and guns  What other actions can I take to limit ways that I can hurt myself? parents have the guns at their home and they are locked up, pt stays out of the kitchen, daughter watches her regarding medicine  Step Two: Warning Signs  What are my specific triggers or warning signs a crisis is developing? Feeling anxious, agitated;Other:;Feeling down, sad, or crying;Feeling hopeless things won't ever get better (talking about life insurance)  Step Three: Distracting Activities  What engaging and calming activities can I do to distract myself? Read;Do a hobby, favorite activity;Other: (spend time with pets and daughters)  Step Four: Distracting People and Places  Who helps me to take my mind off of my problems? daughters and grandchildren  Where can I go to take my mind off my problems? going shopping  Step Five: Social Support  Which family members or friends can I talk to to help me feel better? friend [place] in [place], daughters  Who can help me get through the crisis? friend [place] and daughters  Who is supportive? daughters  Step Six: Professional Help  Where can I get help? [facility], ED, calling 911 or 988
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
## 2023-03-01 NOTE — Transitions of Care (Post Inpatient/ED Visit) (Signed)
   03/01/2023  Name: Lauren Chapman MRN: [id number] DOB: Feb 26, 1970  Today's TOC FU Call Status: Today's TOC FU Call Status:: Unsuccessful Call (2nd Attempt) Unsuccessful Call (2nd Attempt) Date: 03/01/23  Attempted to reach the patient regarding the most recent Inpatient visit; left HIPAA compliant voice message requesting call back  Follow Up Plan: Additional outreach attempts will be made to reach the patient to complete the Transitions of Care (Post Inpatient visit) call.   Jerimie Mancuso Mckinney Josclyn Rosales, RN, BSN, Media Planner  Transitions of Care  [facility] - [facility] [phone]: direct office

## 2023-03-02 ENCOUNTER — Encounter: Payer: Self-pay | Admitting: Family Medicine

## 2023-03-02 ENCOUNTER — Telehealth: Payer: Self-pay | Admitting: *Deleted

## 2023-03-02 ENCOUNTER — Ambulatory Visit (INDEPENDENT_AMBULATORY_CARE_PROVIDER_SITE_OTHER): Payer: 59 | Admitting: Family Medicine

## 2023-03-02 VITALS — BP 98/62 | HR 64 | Temp 97.7°F | Wt 196.6 lb

## 2023-03-02 DIAGNOSIS — E7800 Pure hypercholesterolemia, unspecified: Secondary | ICD-10-CM | POA: Diagnosis not present

## 2023-03-02 DIAGNOSIS — R7303 Prediabetes: Secondary | ICD-10-CM

## 2023-03-02 DIAGNOSIS — Z0000 Encounter for general adult medical examination without abnormal findings: Secondary | ICD-10-CM

## 2023-03-02 NOTE — Transitions of Care (Post Inpatient/ED Visit) (Signed)
 03/02/2023  Name: Lauren Chapman MRN: [id number] DOB: July 07, 1970  Today's TOC FU Call Status: Today's TOC FU Call Status:: Successful TOC FU Call Completed TOC FU Call Complete Date: 03/02/23 Patient's Name and Date of Birth confirmed.  Transition Care Management Follow-up Telephone Call Date of Discharge: 02/27/23 Discharge Facility: Other ([facility]) Name [facility] ([facility]) [facility] Type of Discharge: Inpatient Admission Primary Inpatient Discharge Diagnosis:: schizoaffectve disorder/ Bipolar/ SI How have you been since you were released from the hospital?: Better (I am much better; I saw my regular PCP today and the psychiatrist yesterday- I am enrolled into an intensive psychiatric program starting next week, and [facility] nurse practicioner has called me and is calling again on Monday.) Any questions or concerns?: No  Items Reviewed: Did you receive and understand the discharge instructions provided?: Yes (thoroughly reviewed with patient who verbalizes good understanding of same) Medications obtained,verified, and reconciled?: Yes (Medications Reviewed) (Full medication reconciliation/ review completed; no concerns or discrepancies identified; confirmed patient obtained/ is taking all newly Rx'd medications as instructed; self-manages medications and denies questions/ concerns around medications today) Any new allergies since your discharge?: No Dietary orders reviewed?: Yes Type of Diet Ordered:: Regular Do you have support at home?: Yes People in Home: child(ren), adult Name of Support/Comfort Primary Source: Reports independent in self-care activities; resides with supportive daughter-- assists as/ if needed/ indicated  Medications Reviewed Today: Medications Reviewed Today     Reviewed by Kachina Niederer M, RN (Registered Nurse) on 03/02/23 at 1351  Med List Status: <None>   Medication Order Taking? Sig Documenting Provider Last Dose Status  Informant  albuterol  (VENTOLIN  HFA) 108 (90 Base) MCG/ACT inhaler [id number] Yes Inhale 2 puffs into the lungs every 4 (four) hours as needed for wheezing or shortness of breath. Malachy Comer GAILS, NP Taking Active   ALPRAZolam (XANAX) 1 MG tablet [id number] No Take 1 mg by mouth 2 (two) times daily as needed.  Patient not taking: Reported on 03/02/2023   [provider] Not Taking Active            Med Note MARLAND BEATRIS HERO   Fri Mar 02, 2023  1:18 PM) 03/02/23: Reports during [facility] call psychiatry provider instructed to not take back in January 2025   amoxicillin  (AMOXIL ) 500 MG capsule [id number] No Take 500 mg by mouth every 8 (eight) hours.  Patient not taking: Reported on 03/02/2023   [provider] Not Taking Active            Med Note MARLAND BEATRIS HERO   Fri Mar 02, 2023  1:19 PM) 03/02/23: Reports during TOC call no longer taking- completed the course of treatment as prescribed in December 2024    ARIPiprazole  (ABILIFY ) 30 MG tablet [id number] No Take 30 mg by mouth at bedtime.  Patient not taking: Reported on 03/02/2023   [provider] Not Taking Active Self           Med Note (Chandy Tarman M   Fri Mar 02, 2023  1:20 PM) 03/02/23: Reports during [facility] call psychiatry provider instructed to not take back in January 2025    benztropine  (COGENTIN ) 0.5 MG tablet [id number] No Take by mouth.  Patient not taking: Reported on 03/02/2023   [provider] Not Taking Active            Med Note (Tangelia Sanson M   Fri Mar 02, 2023  1:21 PM) 03/02/23: Reports during [facility] call psychiatry provider instructed to  not take back in January 2025    budesonide -formoterol  (SYMBICORT ) 160-4.5 MCG/ACT inhaler [id number] Yes Inhale 2 puffs into the lungs 2 (two) times daily. Cobb, Comer GAILS, NP Taking Active   buPROPion  (WELLBUTRIN  XL) 150 MG 24 hr tablet [id number] No Take 150 mg by mouth every morning.  Patient not taking: Reported on 03/02/2023   [provider] Not  Taking Active            Med Note MARLAND BEATRIS HERO   Fri Mar 02, 2023  1:21 PM) 03/02/23: Reports during [facility] call psychiatry provider instructed to not take back in January 2025    busPIRone  (BUSPAR ) 15 MG tablet [id number] Yes Take 15 mg by mouth 3 (three) times daily. [provider] Taking Active Self  carbamazepine (TEGRETOL) 100 MG chewable tablet [id number] Yes Chew by mouth. [provider] Taking Active            Med Note MARLAND BEATRIS HERO   Fri Mar 02, 2023  1:22 PM) 03/02/23: Reports during TOC call I was told to take it once a day and that is how I am taking it    citalopram  (CELEXA ) 20 MG tablet [id number] Yes Take 20 mg by mouth daily. [provider] Taking Active Self  diclofenac  (VOLTAREN ) 75 MG EC tablet [id number] No Take 1 tablet (75 mg total) by mouth 2 (two) times daily.  Patient not taking: Reported on 03/02/2023   Magdalen Pasco RAMAN, [place] Not Taking Active            Med Note MARLAND BEATRIS HERO   Fri Mar 02, 2023  1:23 PM) 03/02/23: Reports during TOC call no longer taking, don't even remember who prescribed it or why    EPINEPHrine  0.3 mg/0.3 mL IJ SOAJ injection [id number] Yes Inject 0.3 mg into the muscle as needed for anaphylaxis. Ruthell Lonni FALCON, PA-C Taking Active Self  esomeprazole  (NEXIUM ) 20 MG capsule [id number] Yes Take 1 capsule (20 mg total) by mouth 2 (two) times daily before a meal. Melonie Colonel, Mikel HERO, MD Taking Active Self  gabapentin  (NEURONTIN ) 800 MG tablet [id number] Yes Take 800 mg by mouth 3 (three) times daily.  [provider] Taking Active Self  hydrOXYzine (VISTARIL) 50 MG capsule [id number] Yes Take by mouth. [provider] Taking Active            Med Note MARLAND BEATRIS HERO   Fri Mar 02, 2023  1:24 PM) 03/02/23: Reports during TOC call currently taking TID- prn- post recent hospital discharge at [facility] on 02/27/23    ibuprofen  (ADVIL ) 800 MG tablet [id number] Yes Take 800 mg by mouth every 8 (eight) hours  as needed. [provider] Taking Active   lamoTRIgine  (LAMICTAL ) 200 MG tablet [id number] No Take 200 mg by mouth 2 (two) times daily.  Patient not taking: Reported on 03/02/2023   [provider] Not Taking Active            Med Note MARLAND BEATRIS HERO   Fri Mar 02, 2023  1:24 PM) 03/02/23: Reports during [facility] call psychiatry provider instructed to not take back in January 2025    MYRBETRIQ  50 MG TB24 tablet [id number] Yes Take 50 mg by mouth daily. [provider] Taking Active   ondansetron  (ZOFRAN ) 4 MG tablet [id number] Yes Take 1 tablet (4 mg total) by mouth every 8 (eight) hours as needed for nausea or vomiting. Gayland Lauraine PARAS, NP Taking Active Self  Med Note MARISA NATHANEL LOISE Pablo Jul 18, 2021  4:39 PM)    pantoprazole  (PROTONIX ) 40 MG tablet [id number] Yes Take by mouth. [provider] Taking Active            Med Note MARLAND BEATRIS HERO   Fri Mar 02, 2023  1:26 PM) 03/02/23: Reports during [facility] call taking every day- as prescribed at time of hospital discharge from [facility] on 02/27/23    predniSONE  (DELTASONE ) 10 MG tablet [id number] No 12 day tapering dose  Patient not taking: Reported on 03/02/2023   Magdalen Pasco RAMAN, DPM Not Taking Active            Med Note (Reynard Christoffersen M   Fri Mar 02, 2023  1:26 PM) 03/02/23: Reports during [facility] call has completed course- reports took as prescribed back in December 2024    Rimegepant Sulfate (NURTEC) 75 MG TBDP [id number] Yes Take 1 tablet (75 mg total) by mouth daily as needed. Skeet Juliene SAUNDERS, DO Taking Active   SUMAtriptan  (IMITREX ) 6 MG/0.5ML SOLN injection [id number] Yes Inject 0.5 mLs (6 mg total) into the skin See admin instructions. INJECT 6 MG (0.5 ml) INTO THE SKIN AS NEEDED FOR MIGRAINE OR HEADACHE. MAY REPEAT ONCE IN 2 HOURS IF HEADACHE PERSISTS OR RECURS. MAX 2 DOSES IN 24 HOURS. Skeet Juliene SAUNDERS, DO Taking Active   thiothixene (NAVANE) 5 MG capsule [id number] Yes Take 5 mg by mouth 3 (three) times daily.  [provider] Taking Active            Med Note MARLAND BEATRIS HERO   Fri Mar 02, 2023  1:27 PM) 03/02/23: Reports during TOC call now taking 20 mg at bedtime-- per psychiatry provider instructions in January 2025    tiZANidine  (ZANAFLEX ) 4 MG tablet [id number] Yes Take 1 tablet (4 mg total) by mouth every 6 (six) hours as needed for muscle spasms. Skeet Juliene SAUNDERS, DO Taking Active   traMADol  (ULTRAM ) 50 MG tablet [id number] No Take 50 mg by mouth every 6 (six) hours as needed.  Patient not taking: Reported on 03/02/2023   [provider] Not Taking Active            Med Note MARLAND BEATRIS HERO   Fri Mar 02, 2023  1:28 PM) 03/02/23: Reports during [facility] call no longer taking, as of December 2024             Home Care and Equipment/Supplies: Were Home Health Services Ordered?: No Any new equipment or medical supplies ordered?: No  Functional Questionnaire: Do you need assistance with bathing/showering or dressing?: No Do you need assistance with meal preparation?: No Do you need assistance with eating?: No Do you have difficulty maintaining continence: No Do you need assistance with getting out of bed/getting out of a chair/moving?: No Do you have difficulty managing or taking your medications?: No  Follow up appointments reviewed: PCP Follow-up appointment confirmed?: Yes Date of PCP follow-up appointment?: 03/02/23 Follow-up Provider: confirmed attended PCP provider office visit as scheduled- reviewed visit with patient today Specialist Hospital Follow-up appointment confirmed?: Yes Date of Specialist follow-up appointment?: 03/01/23 Follow-Up Specialty Provider:: confirmed attended psychiatry provider office visit as scheduled- reviewed visit with patient today Do you need transportation to your follow-up appointment?: No Do you understand care options if your condition(s) worsen?: Yes-patient verbalized understanding  SDOH Interventions Today    Flowsheet Row Most  Recent Value  SDOH Interventions   Food Insecurity Interventions Other (Comment)  [Patient reports  has previously been provided resources: states she is currently established with [facility] care management program- she will inquire with them re: reosurces: will call me back if she needs additional resources- provided my direct phone #]  Housing Interventions Intervention Not Indicated  Transportation Interventions Intervention Not Indicated  [Reports drives self,  family assists as indicated]  Utilities Interventions Intervention Not Indicated      Interventions Today    Flowsheet Row Most Recent Value  Chronic Disease   Chronic disease during today's visit Other  [schizoaffective disorder/ SI]  General Interventions   General Interventions Discussed/Reviewed General Interventions Discussed, Horticulturist, Commercial (DME), Doctor Visits  Doctor Visits Discussed/Reviewed Doctor Visits Discussed, PCP, Specialist, Doctor Visits Reviewed  Durable Medical Equipment (DME) Other  [confirmed not currently requiring/ using assistive devices for ambulation]  PCP/Specialist Visits Compliance with follow-up visit  Education Interventions   Education Provided Provided Education  Provided Verbal Education On Development Worker, Community, [facility], When to see the doctor, Medication  [Potential need for [facility] post-recent hospital discharge: patient states she believes she will be well-supportive with intensive psychiatric program/ [facility] care management team- currently established/ active]  Mental Health Interventions   Mental Health Discussed/Reviewed Mental Health Discussed  [patient states she believes she will be well-supportive post-hospital discharge on 02/27/23- intensive psychiatric program and [facility] care management team established/ active- provided my direct number should needs arise post- TOC call today]  Nutrition Interventions   Nutrition Discussed/Reviewed Nutrition Discussed   Pharmacy Interventions   Pharmacy Dicussed/Reviewed Pharmacy Topics Discussed  [Full medication review with updating medication list in EHR per patient report: provided education around having care providers remove the medications she is not currently taking from her medication list]      TOC Interventions Today    Flowsheet Row Most Recent Value  TOC Interventions   TOC Interventions Discussed/Reviewed TOC Interventions Discussed  [patient states she believes she will be well-supportive post-hospital discharge on 02/27/23- intensive psychiatric program and [facility] care management team established/ active- provided my direct number should needs arise post- TOC call today]      Total time spent from review to signing of note/ including any care coordination interventions:  58 minutes: extensive medication review/ updating of list/ detailed psychiatric follow up information  Beatris Blinda Lawrence, RN, BSN, Media Planner  Transitions of Care  [facility] Population [facility]  [place] [phone]: direct office

## 2023-03-02 NOTE — Patient Instructions (Addendum)
 Keep follow with specialists as planned.  Call GI for colonoscopy - due in June. [phone].  Recent labs showed prediabetes and slight elevated cholesterol level but weight loss would be an initial approach.  Keep up the good work as the weight is improving.  We can recheck labs in 6 months.  See information below for a physical but please let me know if you have questions.  Hang in there.  Preventive Care 67-55 Years Old, Female Preventive care refers to lifestyle choices and visits with your health care provider that can promote health and wellness. Preventive care visits are also called wellness exams. What can I expect for my preventive care visit? Counseling Your health care provider may ask you questions about your: Medical history, including: Past medical problems. Family medical history. Pregnancy history. Current health, including: Menstrual cycle. Method of birth control. Emotional well-being. Home life and relationship well-being. Sexual activity and sexual health. Lifestyle, including: Alcohol, nicotine  or tobacco, and drug use. Access to firearms. Diet, exercise, and sleep habits. Work and work astronomer. Sunscreen use. Safety issues such as seatbelt and bike helmet use. Physical exam Your health care provider will check your: Height and weight. These may be used to calculate your BMI (body mass index). BMI is a measurement that tells if you are at a healthy weight. Waist circumference. This measures the distance around your waistline. This measurement also tells if you are at a healthy weight and may help predict your risk of certain diseases, such as type 2 diabetes and high blood pressure. Heart rate and blood pressure. Body temperature. Skin for abnormal spots. What immunizations do I need?  Vaccines are usually given at various ages, according to a schedule. Your health care provider will recommend vaccines for you based on your age, medical history, and  lifestyle or other factors, such as travel or where you work. What tests do I need? Screening Your health care provider may recommend screening tests for certain conditions. This may include: Lipid and cholesterol levels. Diabetes screening. This is done by checking your blood sugar (glucose) after you have not eaten for a while (fasting). Pelvic exam and Pap test. Hepatitis B test. Hepatitis C test. HIV (human immunodeficiency virus) test. STI (sexually transmitted infection) testing, if you are at risk. Lung cancer screening. Colorectal cancer screening. Mammogram. Talk with your health care provider about when you should start having regular mammograms. This may depend on whether you have a family history of breast cancer. BRCA-related cancer screening. This may be done if you have a family history of breast, ovarian, tubal, or peritoneal cancers. Bone density scan. This is done to screen for osteoporosis. Talk with your health care provider about your test results, treatment options, and if necessary, the need for more tests. Follow these instructions at home: Eating and drinking  Eat a diet that includes fresh fruits and vegetables, whole grains, lean protein, and low-fat dairy products. Take vitamin and mineral supplements as recommended by your health care provider. Do not drink alcohol if: Your health care provider tells you not to drink. You are pregnant, may be pregnant, or are planning to become pregnant. If you drink alcohol: Limit how much you have to 0-1 drink a day. Know how much alcohol is in your drink. In the [place] one drink equals one 12 oz bottle of beer (355 mL), one 5 oz glass of wine (148 mL), or one 1 oz glass of hard liquor (44 mL). Lifestyle Brush your teeth every morning and  night with fluoride toothpaste. Floss one time each day. Exercise for at least 30 minutes 5 or more days each week. Do not use any products that contain nicotine  or tobacco. These  products include cigarettes, chewing tobacco, and vaping devices, such as e-cigarettes. If you need help quitting, ask your health care provider. Do not use drugs. If you are sexually active, practice safe sex. Use a condom or other form of protection to prevent STIs. If you do not wish to become pregnant, use a form of birth control. If you plan to become pregnant, see your health care provider for a prepregnancy visit. Take aspirin only as told by your health care provider. Make sure that you understand how much to take and what form to take. Work with your health care provider to find out whether it is safe and beneficial for you to take aspirin daily. Find healthy ways to manage stress, such as: Meditation, yoga, or listening to music. Journaling. Talking to a trusted person. Spending time with friends and family. Minimize exposure to UV radiation to reduce your risk of skin cancer. Safety Always wear your seat belt while driving or riding in a vehicle. Do not drive: If you have been drinking alcohol. Do not ride with someone who has been drinking. When you are tired or distracted. While texting. If you have been using any mind-altering substances or drugs. Wear a helmet and other protective equipment during sports activities. If you have firearms in your house, make sure you follow all gun safety procedures. Seek help if you have been physically or sexually abused. What's next? Visit your health care provider once a year for an annual wellness visit. Ask your health care provider how often you should have your eyes and teeth checked. Stay up to date on all vaccines. This information is not intended to replace advice given to you by your health care provider. Make sure you discuss any questions you have with your health care provider. Document Revised: 07/07/2020 Document Reviewed: 07/07/2020 Elsevier Patient Education  2024 [employer].

## 2023-03-02 NOTE — Progress Notes (Signed)
 Subjective:  Patient ID: Lauren Chapman, female    DOB: 1970/12/06  Age: 53 y.o. MRN: [id number]  CC:  Chief Complaint  Patient presents with   Annual Exam    Pt is fasting; just [facility] of [facility]    HPI Lauren Chapman presents for Annual Exam  PCP, me Psychiatry, Dr. Jess, will be changing to new psychiatrist.  Recently admitted to [facility], schizoaffective disorder, bipolar.  January 31 through February 4.  Started on hydroxyzine, Protonix  for reflux, , Trazodone.  Celexa  20 mg daily, BuSpar  15 mg 3 times daily, Tegretol 100 mg twice daily, Navane 20 mg nightly, gabapentin  800 mg 3 times daily for neuropathic pain, ibuprofen  800 mg as needed for pain.  Myrbetriq  50 mg daily for overactive bladder, Symbicort  for asthma.  Imitrex , Nurtec, Zofran , Nexium  were discontinued. Appt with new psychiatrist on 2/21 at [facility]. Saw therapist yesterday, partial hospitalization starting next Tuesday with intensive group therapy for 3 weeks, then intensive outpatient therapy for 3 weeks.  Neurology, Dr. Skeet, history of migraine. Urology Dr. Gaston - overactive bladder - implant and Myrbetriq .  Pulmonary  - copd, asthma, treated by Dr. Milagros - symbicort , albuterol  as needed.  GYN - [facility] [place]. Will need to reshedule - had appt when in hospital.      03/02/2023   10:33 AM 03/01/2023   11:04 AM 01/09/2023   11:39 AM 09/11/2022   11:44 AM 08/18/2022   10:20 AM  Depression screen PHQ 2/9  Decreased Interest 2  2 1 1   Down, Depressed, Hopeless 3  3 2 2   PHQ - 2 Score 5  5 3 3   Altered sleeping 1  0 2 0  Tired, decreased energy 1  1 1 3   Change in appetite 1  2 3 2   Feeling bad or failure about yourself  2  3 2 1   Trouble concentrating 2  0 1 1  Moving slowly or fidgety/restless 1  0 3 1  Suicidal thoughts 0  1 1 0  PHQ-9 Score 13  12 16 11   Difficult doing work/chores Very difficult  Very difficult       Information is  confidential and restricted. Go to Review Flowsheets to unlock data.    Health Maintenance  Topic Date Due   COVID-19 Vaccine (8 - 2024-25 season) 03/18/2023 (Originally 03/01/2023)   MAMMOGRAM  03/14/2023   Fecal DNA (Cologuard)  07/06/2023   Medicare Annual Wellness (AWV)  01/09/2024   Lung Cancer Screening  01/18/2024   DTaP/Tdap/Td (2 - Td or Tdap) 10/15/2028   Colonoscopy  07/09/2030   Pneumococcal Vaccine 53-69 Years old  Completed   INFLUENZA VACCINE  Completed   Hepatitis C Screening  Completed   HIV Screening  Completed   Zoster Vaccines- Shingrix  Completed   HPV VACCINES  Aged Out  Colonoscopy 07/08/20, repeat 3 yrs precancerous polyps.  S/p hysterectomy - followed by GYN.  Mammogram in 2024 - scheduled 2/21.   Immunization History  Administered Date(s) Administered   Fluad Quad(high Dose 65+) 01/04/2023   Influenza Inj Mdck Quad Pf 01/03/2019   Influenza Split 10/11/2011   Influenza,inj,Quad PF,6+ Mos 12/04/2017, 12/01/2019, 11/07/2021   Influenza-Unspecified 01/03/2019   PFIZER(Purple Top)SARS-COV-2 Vaccination 04/19/2019, 05/10/2019, 01/22/2020, 10/26/2020, 11/07/2021, 01/04/2023   PNEUMOCOCCAL CONJUGATE-20 08/02/2020   Pfizer Covid-19 Vaccine Bivalent Booster 72yrs & up 05/15/2020   Tdap 10/16/2018   Zoster Recombinant(Shingrix) 05/31/2020, 08/02/2020  Covid booster in October  at pharmacy.   No results found. Wears glasses - last visit 04/2022.   Dental:Within Last 6 months appt in March. Prior fillings, considering caps to teeth. Has new bridge in December.  Alcohol: none  Tobacco: none  Exercise: some walking, Wt Readings from Last 3 Encounters:  03/02/23 196 lb 9.6 oz (89.2 kg)  01/18/23 207 lb (93.9 kg)  12/27/22 207 lb 3.2 oz (94 kg)    Lab Results  Component Value Date   HGBA1C 5.8 05/10/2022  A1c 5.9 at [facility] on 1/13. Weight improving as above.  Lab Results  Component Value Date   CHOL 153 11/07/2021   HDL 52.50 11/07/2021   LDLCALC 73  11/07/2021   TRIG 134.0 11/07/2021   CHOLHDL 3 11/07/2021   Lipids 2 weeks ago tc 185, trig 156, ldl106, HDL 48.   History Patient Active Problem List   Diagnosis Date Noted   Schizoaffective disorder (HCC) 03/01/2023   Poorly controlled mild persistent asthma 09/05/2021   Syncope 08/02/2021   Tobacco abuse 08/02/2021   Vaginal candidiasis 08/02/2021   CAP (community acquired pneumonia) 07/18/2021   OSA (obstructive sleep apnea) 07/18/2021   Hypokalemia 07/18/2021   Depression 03/24/2020   Bilateral low back pain with left-sided sciatica 03/24/2020   Left lumbar radiculopathy 02/25/2020   Absence of bladder continence 02/25/2020   History of postmenopausal bleeding 04/29/2019   Polypharmacy 04/03/2019   Emphysema, unspecified (HCC) 05/17/2018   Weakness 01/25/2017   Chronic migraine w/o aura w/o status migrainosus, not intractable 12/22/2014   Encounter for other general counseling or advice on contraception 09/02/2014   Bipolar disorder (HCC) 10/12/2011   Anxiety 10/12/2011   GERD (gastroesophageal reflux disease) 10/12/2011   Past Medical History:  Diagnosis Date   Allergy    Anxiety    Bipolar affective psychosis (HCC)    Complication of anesthesia     Acting silly- waving at everyone-Giddy   Depression    Emphysema lung (HCC)    inhaler prn,  followed by pcp and pulmonologist-- dr frances   Family history of adverse reaction to anesthesia    Father had rheumatic fever, and died on operating table.   GERD (gastroesophageal reflux disease)    Kidney cysts 01/24/2020   Right Kidney   Migraine    Moderate persistent asthma 09/05/2021   Smokers' cough (HCC)    per pt not productive   SUI (stress urinary incontinence, female)    Tubular adenoma of colon 07/08/2020   Wears glasses    Past Surgical History:  Procedure Laterality Date   ABDOMINAL HYSTERECTOMY N/A    Phreesia 11/30/2019   CARPAL TUNNEL RELEASE Right 09-12-2007   @[facility]    and GANGLION CYST  EXCISION   COLONOSCOPY     CYSTOSCOPY N/A 04/29/2019   Procedure: CYSTOSCOPY;  Surgeon: Arnaldo Purchase, MD;  Location: [facility];  Service: Gynecology;  Laterality: N/A;   DILATATION & CURETTAGE/HYSTEROSCOPY WITH MYOSURE N/A 11/13/2018   Procedure: DILATATION & CURETTAGE/HYSTEROSCOPY WITH MYOSURE;  Surgeon: Rockney Evalene SQUIBB, MD;  Location: [facility];  Service: Gynecology;  Laterality: N/A;  request 9:00am OR start time in [place] [facility] block requests one hour   DILATION AND CURETTAGE OF UTERUS  02/2019   EYE SURGERY N/A    Phreesia 11/30/2019   GANGLION CYST EXCISION     R wrist   interstem      implant for overactive bladder   KNEE ARTHROSCOPY WITH ANTERIOR CRUCIATE LIGAMENT (ACL) REPAIR Right 10/2015   LAPAROSCOPIC CHOLECYSTECTOMY  1997   LAPAROSCOPIC HYSTERECTOMY Bilateral 04/29/2019   Procedure: HYSTERECTOMY TOTAL LAPAROSCOPIC, BILATERAL SALPINO-OOPHORECTOMY;  Surgeon: Arnaldo Purchase, MD;  Location: [facility];  Service: Gynecology;  Laterality: Bilateral;   LAPAROSCOPIC TOTAL HYSTERECTOMY     TOOTH EXTRACTION  04/01/2019   tooth removal Bilateral 2022   4 teeth removed   UPPER GASTROINTESTINAL ENDOSCOPY     Allergies  Allergen Reactions   Codeine Anaphylaxis and Other (See Comments)    Cannot have ANYTHING with codeine   Prior to Admission medications   Medication Sig Start Date End Date Taking? Authorizing Provider  albuterol  (VENTOLIN  HFA) 108 (90 Base) MCG/ACT inhaler Inhale 2 puffs into the lungs every 4 (four) hours as needed for wheezing or shortness of breath. 07/18/22  Yes Cobb, Comer GAILS, NP  budesonide -formoterol  (SYMBICORT ) 160-4.5 MCG/ACT inhaler Inhale 2 puffs into the lungs 2 (two) times daily. 07/18/22  Yes Cobb, Comer GAILS, NP  busPIRone  (BUSPAR ) 15 MG tablet Take 15 mg by mouth 3 (three) times daily. 07/30/20  Yes [provider]  carbamazepine (TEGRETOL) 100 MG chewable tablet Chew by mouth.  02/11/23 03/13/23 Yes [provider]  citalopram  (CELEXA ) 20 MG tablet Take 20 mg by mouth daily.   Yes [provider]  EPINEPHrine  0.3 mg/0.3 mL IJ SOAJ injection Inject 0.3 mg into the muscle as needed for anaphylaxis. 03/29/21  Yes Ruthell Lonni FALCON, PA-C  esomeprazole  (NEXIUM ) 20 MG capsule Take 1 capsule (20 mg total) by mouth 2 (two) times daily before a meal. 03/21/19  Yes Melonie Colonel, Mikel HERO, MD  gabapentin  (NEURONTIN ) 800 MG tablet Take 800 mg by mouth 3 (three) times daily.  07/02/18  Yes [provider]  hydrOXYzine (VISTARIL) 50 MG capsule Take by mouth. 02/27/23 03/09/23 Yes [provider]  ibuprofen  (ADVIL ) 800 MG tablet Take 800 mg by mouth every 8 (eight) hours as needed. 01/08/23  Yes [provider]  MYRBETRIQ  50 MG TB24 tablet Take 50 mg by mouth daily. 11/11/21  Yes [provider]  ondansetron  (ZOFRAN ) 4 MG tablet Take 1 tablet (4 mg total) by mouth every 8 (eight) hours as needed for nausea or vomiting. 01/05/20  Yes Gayland Lauraine PARAS, NP  pantoprazole  (PROTONIX ) 40 MG tablet Take by mouth. 02/28/23 03/30/23 Yes [provider]  Rimegepant Sulfate (NURTEC) 75 MG TBDP Take 1 tablet (75 mg total) by mouth daily as needed. 09/18/22  Yes Jaffe, Adam R, DO  SUMAtriptan  (IMITREX ) 6 MG/0.5ML SOLN injection Inject 0.5 mLs (6 mg total) into the skin See admin instructions. INJECT 6 MG (0.5 ml) INTO THE SKIN AS NEEDED FOR MIGRAINE OR HEADACHE. MAY REPEAT ONCE IN 2 HOURS IF HEADACHE PERSISTS OR RECURS. MAX 2 DOSES IN 24 HOURS. 02/17/22  Yes Skeet, Adam R, DO  thiothixene (NAVANE) 5 MG capsule Take 5 mg by mouth 3 (three) times daily. 04/07/22  Yes [provider]  tiZANidine  (ZANAFLEX ) 4 MG tablet Take 1 tablet (4 mg total) by mouth every 6 (six) hours as needed for muscle spasms. 08/15/21  Yes Jaffe, Adam R, DO  ALPRAZolam (XANAX) 1 MG tablet Take 1 mg by mouth 2 (two) times daily as needed. Patient not taking: Reported on 03/02/2023  08/21/22   [provider]  amoxicillin  (AMOXIL ) 500 MG capsule Take 500 mg by mouth every 8 (eight) hours. Patient not taking: Reported on 03/02/2023 01/08/23   [provider]  ARIPiprazole  (ABILIFY ) 30 MG tablet Take 30 mg by mouth at bedtime. Patient not taking: Reported  on 03/02/2023 12/23/20   [provider]  benztropine  (COGENTIN ) 0.5 MG tablet Take by mouth. Patient not taking: Reported on 03/02/2023 12/07/21   [provider]  buPROPion  (WELLBUTRIN  XL) 150 MG 24 hr tablet Take 150 mg by mouth every morning. Patient not taking: Reported on 03/02/2023 04/06/22   [provider]  diclofenac  (VOLTAREN ) 75 MG EC tablet Take 1 tablet (75 mg total) by mouth 2 (two) times daily. Patient not taking: Reported on 03/02/2023 03/29/22   Magdalen Pasco RAMAN, DPM  lamoTRIgine  (LAMICTAL ) 200 MG tablet Take 200 mg by mouth 2 (two) times daily. Patient not taking: Reported on 03/02/2023 12/20/21   [provider]  predniSONE  (DELTASONE ) 10 MG tablet 12 day tapering dose Patient not taking: Reported on 03/02/2023 11/29/22   Magdalen Pasco RAMAN, DPM  traMADol  (ULTRAM ) 50 MG tablet Take 50 mg by mouth every 6 (six) hours as needed. Patient not taking: Reported on 03/02/2023    [provider]   Social History   Socioeconomic History   Marital status: Divorced    Spouse name: n/a   Number of children: 2   Years of education: 18   Highest education level: Not on file  Occupational History   Occupation: Disabled    Comment: Formerly a runner, broadcasting/film/video.  Tobacco Use   Smoking status: Former    Current packs/day: 0.00    Average packs/day: 1 pack/day for 39.8 years (39.8 ttl pk-yrs)    Types: Cigarettes    Start date: 10/10/1981    Quit date: 07/18/2021    Years since quitting: 1.6    Passive exposure: Never   Smokeless tobacco: Never  Vaping Use   Vaping status: Never Used  Substance and Sexual Activity   Alcohol use: Never    Alcohol/week: 0.0 standard drinks of  alcohol   Drug use: Never   Sexual activity: Not Currently    Partners: Male    Birth control/protection: Post-menopausal    Comment: 1st intercourse 53 yo-More than 5 partners  Other Topics Concern   Not on file  Social History Narrative   Lives at home with 1 of her two daughters   Right-handed.   2-4 cups caffeine daily.   Disabled    One story home   Social Drivers of Health   Financial Resource Strain: Low Risk  (01/09/2023)   Overall Financial Resource Strain (CARDIA)    Difficulty of Paying Living Expenses: Not very hard  Food Insecurity: Food Insecurity Present (02/23/2023)   [facility]   Hunger Vital Sign    Worried About Running Out of Food in the Last Year: Sometimes true    Ran Out of Food in the Last Year: Sometimes true  Transportation Needs: No Transportation Needs (02/23/2023)   Received from [facility]   PRAPARE - Transportation    Lack of Transportation (Medical): No    Lack of Transportation (Non-Medical): No  Physical Activity: Inactive (01/09/2023)   Exercise Vital Sign    Days of Exercise per Week: 0 days    Minutes of Exercise per Session: 0 min  Stress: No Stress Concern Present (02/15/2023)   Received from [facility] - Occupational Stress Questionnaire    Feeling of Stress : Only a little  Recent Concern: Stress - Stress Concern Present (02/05/2023)   Received from [facility] - Occupational Stress Questionnaire    Feeling of Stress : To some extent  Social Connections: Socially Isolated (01/09/2023)   Social Connection and Isolation Panel [NHANES]    Frequency of Communication with Friends and Family: Never    Frequency of Social Gatherings with Friends and Family: Never    Attends Religious Services: Never    Database Administrator or Organizations: No    Attends Banker Meetings: Never    Marital Status: Divorced   Catering Manager Violence: Not At Risk (02/23/2023)   Received from [facility]   HITS    Over the last 12 months how often did your partner physically hurt you?: Never    Over the last 12 months how often did your partner insult you or talk down to you?: Never    Over the last 12 months how often did your partner threaten you with physical harm?: Never    Over the last 12 months how often did your partner scream or curse at you?: Never    Review of Systems 13 point review of systems per patient health survey noted.  Negative other than as indicated above or in HPI.    Objective:   Vitals:   03/02/23 1029  BP: 98/62  Pulse: 64  Temp: 97.7 F (36.5 C)  SpO2: 98%  Weight: 196 lb 9.6 oz (89.2 kg)     Physical Exam Constitutional:      Appearance: She is well-developed.  HENT:     Head: Normocephalic and atraumatic.     Right Ear: External ear normal.     Left Ear: External ear normal.  Eyes:     Conjunctiva/sclera: Conjunctivae normal.     Pupils: Pupils are equal, round, and reactive to light.  Neck:     Thyroid : No thyromegaly.  Cardiovascular:     Rate and Rhythm: Normal rate and regular rhythm.     Heart sounds: Normal heart sounds. No murmur heard. Pulmonary:     Effort: Pulmonary effort is normal. No respiratory distress.     Breath sounds: Normal breath sounds. No wheezing.  Abdominal:     General: Bowel sounds are normal.     Palpations: Abdomen is soft.     Tenderness: There is no abdominal tenderness.  Musculoskeletal:        General: No tenderness. Normal range of motion.     Cervical back: Normal range of motion and neck supple.  Lymphadenopathy:     Cervical: No cervical adenopathy.  Skin:    General: Skin is warm and dry.     Findings: No rash.  Neurological:     Mental Status: She is alert and oriented to person, place, and time.  Psychiatric:        Behavior: Behavior normal.        Thought Content: Thought content normal.         Assessment & Plan:  Lauren Chapman is a 53 y.o. female . Annual physical exam  - -anticipatory guidance as below in AVS, screening labs above. Health maintenance items as above in HPI discussed/recommended as applicable.   Prediabetes  -Elevated on recent labs.  Weight loss, diet/exercise as initial approach and she has been improving with weight, commended on efforts to this point.  Recheck labs in 6 months.  Elevated LDL cholesterol level  -As above, diet exercise approach initially with repeat labs in 6 months.  No orders of the defined types were placed in this encounter.  Patient Instructions  Keep follow with specialists as planned.  Call GI for colonoscopy -  due in June. [phone].  Recent labs showed prediabetes and slight elevated cholesterol level but weight loss would be an initial approach.  Keep up the good work as the weight is improving.  We can recheck labs in 6 months.  See information below for a physical but please let me know if you have questions.  Hang in there.  Preventive Care 65-69 Years Old, Female Preventive care refers to lifestyle choices and visits with your health care provider that can promote health and wellness. Preventive care visits are also called wellness exams. What can I expect for my preventive care visit? Counseling Your health care provider may ask you questions about your: Medical history, including: Past medical problems. Family medical history. Pregnancy history. Current health, including: Menstrual cycle. Method of birth control. Emotional well-being. Home life and relationship well-being. Sexual activity and sexual health. Lifestyle, including: Alcohol, nicotine  or tobacco, and drug use. Access to firearms. Diet, exercise, and sleep habits. Work and work astronomer. Sunscreen use. Safety issues such as seatbelt and bike helmet use. Physical exam Your health care provider will check your: Height and weight.  These may be used to calculate your BMI (body mass index). BMI is a measurement that tells if you are at a healthy weight. Waist circumference. This measures the distance around your waistline. This measurement also tells if you are at a healthy weight and may help predict your risk of certain diseases, such as type 2 diabetes and high blood pressure. Heart rate and blood pressure. Body temperature. Skin for abnormal spots. What immunizations do I need?  Vaccines are usually given at various ages, according to a schedule. Your health care provider will recommend vaccines for you based on your age, medical history, and lifestyle or other factors, such as travel or where you work. What tests do I need? Screening Your health care provider may recommend screening tests for certain conditions. This may include: Lipid and cholesterol levels. Diabetes screening. This is done by checking your blood sugar (glucose) after you have not eaten for a while (fasting). Pelvic exam and Pap test. Hepatitis B test. Hepatitis C test. HIV (human immunodeficiency virus) test. STI (sexually transmitted infection) testing, if you are at risk. Lung cancer screening. Colorectal cancer screening. Mammogram. Talk with your health care provider about when you should start having regular mammograms. This may depend on whether you have a family history of breast cancer. BRCA-related cancer screening. This may be done if you have a family history of breast, ovarian, tubal, or peritoneal cancers. Bone density scan. This is done to screen for osteoporosis. Talk with your health care provider about your test results, treatment options, and if necessary, the need for more tests. Follow these instructions at home: Eating and drinking  Eat a diet that includes fresh fruits and vegetables, whole grains, lean protein, and low-fat dairy products. Take vitamin and mineral supplements as recommended by your health care  provider. Do not drink alcohol if: Your health care provider tells you not to drink. You are pregnant, may be pregnant, or are planning to become pregnant. If you drink alcohol: Limit how much you have to 0-1 drink a day. Know how much alcohol is in your drink. In the [place] one drink equals one 12 oz bottle of beer (355 mL), one 5 oz glass of wine (148 mL), or one 1 oz glass of hard liquor (44 mL). Lifestyle Brush your teeth every morning and night with fluoride toothpaste. Floss one time each day. Exercise for  at least 30 minutes 5 or more days each week. Do not use any products that contain nicotine  or tobacco. These products include cigarettes, chewing tobacco, and vaping devices, such as e-cigarettes. If you need help quitting, ask your health care provider. Do not use drugs. If you are sexually active, practice safe sex. Use a condom or other form of protection to prevent STIs. If you do not wish to become pregnant, use a form of birth control. If you plan to become pregnant, see your health care provider for a prepregnancy visit. Take aspirin only as told by your health care provider. Make sure that you understand how much to take and what form to take. Work with your health care provider to find out whether it is safe and beneficial for you to take aspirin daily. Find healthy ways to manage stress, such as: Meditation, yoga, or listening to music. Journaling. Talking to a trusted person. Spending time with friends and family. Minimize exposure to UV radiation to reduce your risk of skin cancer. Safety Always wear your seat belt while driving or riding in a vehicle. Do not drive: If you have been drinking alcohol. Do not ride with someone who has been drinking. When you are tired or distracted. While texting. If you have been using any mind-altering substances or drugs. Wear a helmet and other protective equipment during sports activities. If you have firearms in your house, make  sure you follow all gun safety procedures. Seek help if you have been physically or sexually abused. What's next? Visit your health care provider once a year for an annual wellness visit. Ask your health care provider how often you should have your eyes and teeth checked. Stay up to date on all vaccines. This information is not intended to replace advice given to you by your health care provider. Make sure you discuss any questions you have with your health care provider. Document Revised: 07/07/2020 Document Reviewed: 07/07/2020 Elsevier Patient Education  2024 Elsevier Inc.    Signed,   Reyes Pines, MD [facility] 03/02/23 10:59 AM

## 2023-03-06 ENCOUNTER — Other Ambulatory Visit (HOSPITAL_COMMUNITY): Payer: 59 | Attending: Psychiatry

## 2023-03-06 ENCOUNTER — Other Ambulatory Visit (HOSPITAL_COMMUNITY): Payer: 59 | Attending: Psychiatry | Admitting: Licensed Clinical Social Worker

## 2023-03-06 ENCOUNTER — Encounter (HOSPITAL_COMMUNITY): Payer: Self-pay

## 2023-03-06 DIAGNOSIS — R4589 Other symptoms and signs involving emotional state: Secondary | ICD-10-CM

## 2023-03-06 DIAGNOSIS — F4310 Post-traumatic stress disorder, unspecified: Secondary | ICD-10-CM | POA: Diagnosis not present

## 2023-03-06 DIAGNOSIS — Z79899 Other long term (current) drug therapy: Secondary | ICD-10-CM | POA: Diagnosis not present

## 2023-03-06 DIAGNOSIS — F259 Schizoaffective disorder, unspecified: Secondary | ICD-10-CM

## 2023-03-06 DIAGNOSIS — R45851 Suicidal ideations: Secondary | ICD-10-CM | POA: Diagnosis not present

## 2023-03-06 DIAGNOSIS — F333 Major depressive disorder, recurrent, severe with psychotic symptoms: Secondary | ICD-10-CM

## 2023-03-06 DIAGNOSIS — F250 Schizoaffective disorder, bipolar type: Secondary | ICD-10-CM | POA: Diagnosis present

## 2023-03-06 DIAGNOSIS — F411 Generalized anxiety disorder: Secondary | ICD-10-CM | POA: Diagnosis not present

## 2023-03-06 NOTE — Treatment Plan (Signed)
Active     OP Depression     LTG: Reduce frequency, intensity, and duration of depression symptoms so that daily functioning is improved (Initial)     Start:  03/06/23    Expected End:  06/03/23         LTG: Increase coping skills to manage depression and improve ability to perform daily activities (Initial)     Start:  03/06/23    Expected End:  06/03/23         LTG: Lauren Chapman will score less than 10 on the Patient Health Questionnaire (PHQ-9)  (Initial)     Start:  03/06/23    Expected End:  06/03/23         STG: Lauren Chapman will attend at least 80% of scheduled PHP sessions    (Initial)     Start:  03/06/23    Expected End:  06/03/23         STG: Lauren Chapman will attend at least 80% of scheduled group psychotherapy sessions  (Initial)     Start:  03/06/23    Expected End:  06/03/23         STG: Lauren Chapman will complete at least 80% of assigned homework  (Initial)     Start:  03/06/23    Expected End:  06/03/23         STG: Lauren Chapman will identify cognitive patterns and beliefs that support depression (Initial)     Start:  03/06/23    Expected End:  06/03/23         Work with Lauren Chapman to identify the major components of a recent episode of depression: physical symptoms, major thoughts and images, and major behaviors they experienced     Start:  03/06/23         Therapist will educate patient on cognitive distortions and the rationale for treatment of depression     Start:  03/06/23         Lauren Chapman will identify at least 3 cognitive distortions they are currently using and write reframing statements to replace them     Start:  03/06/23         Therapist will review PLEASE Skills (Treat Physical Illness, Balance Eating, Avoid Mood-Altering Substances, Balance Sleep and Get Exercise) with patient     Start:  03/06/23           OP Suicidal Ideation     LTG: Lauren Chapman will be free of unhealthy self-harm as evidenced by self-report and examination as appropriate (Initial)     Start:   03/06/23    Expected End:  06/03/23         LTG: Lauren Chapman will utilize appropriate interventions from safety plan when distress or thoughts of self-harm occur (Initial)     Start:  03/06/23    Expected End:  06/03/23         STG: Lauren Chapman will recognize triggers to thoughts of self-harm 80% of the time they occur (Initial)     Start:  03/06/23    Expected End:  06/03/23         STG: Lauren Chapman will use active coping strategies weekly, as evidenced by journal entries, self-report and/or clinician observation (Initial)     Start:  03/06/23    Expected End:  06/03/23         STG: Work with Lauren Chapman to create and implement personal suicide risk safety plan (Initial)     Start:  03/06/23    Expected End:  06/03/23  Lauren Chapman to remove firearms or other potentially lethal means from immediate access     Start:  03/06/23         Encourage Lauren Chapman to follow personal safety plan when in crisis     Start:  03/06/23

## 2023-03-06 NOTE — Progress Notes (Unsigned)
Psychiatric Initial Adult Assessment   Patient Identification: Lauren Chapman MRN:  [id number] Date of Evaluation:  03/07/2023 Referral Source: Recently hospitalized due to suicide attempt Chief Complaint: Mood instability  Visit Diagnosis:    ICD-10-CM   1. Severe episode of recurrent major depressive disorder, with psychotic features (HCC)  F33.3 OT eval and treat      History of Present Illness: Lauren Chapman 53 year old female presents after recent inpatient admission.  She reports she carries a diagnosis related to schizoaffective disorder, bipolar disorder, generalized anxiety disorder, posttraumatic stress disorder, obsessive-compulsive disorder.  She reports multiple attempts to harm herself.  States she is currently followed by psychiatrist ready at [facility].  States she felt that she had too much medications and is now going to be followed by somebody else and their practice.    Lauren Chapman reports she is currently prescribed Tegretol, gabapentin, hydroxyzine and Navene reports she has been taking and tolerating medications well.  Reports she has tried multiple medications in the past to include Wellbutrin, Celexa, Lamictal, BuSpar Zoloft.  Reports a history of command hallucinations however voices and hallucinations are intermittent.  Reports she has 2 children reports patient to start partial hospitalization programming on 2/12  During evaluation Lauren Chapman is sitting ;she is alert/oriented x 4; calm/cooperative; and mood congruent with affect.  Patient is speaking in a clear tone at moderate volume, and normal pace; with good eye contact. Her thought process is coherent and relevant;   There is no indication that she is currently responding to internal/external stimuli or experiencing delusional thought content.  Patient denies suicidal/self-harm/homicidal ideation, psychosis, and paranoia.  Patient has remained calm throughout assessment and has answered questions  appropriately.   Associated Signs/Symptoms: Depression Symptoms:  depressed mood, feelings of worthlessness/guilt, difficulty concentrating, anxiety, (Hypo) Manic Symptoms:  Distractibility, Anxiety Symptoms:  Excessive Worry, Psychotic Symptoms:  Hallucinations: Auditory PTSD Symptoms: Hyperarousal:  Difficulty Concentrating  Past Psychiatric History:   Previous Psychotropic Medications: Yes   Substance Abuse History in the last 12 months:  No.  Consequences of Substance Abuse: NA  Past Medical History:  Past Medical History:  Diagnosis Date   Allergy    Anxiety    Bipolar affective psychosis (HCC)    Complication of anesthesia    " Acting silly- waving at everyone"-Giddy   Depression    Emphysema lung (HCC)    inhaler prn,  followed by pcp and pulmonologist-- dr [name]   Family history of adverse reaction to anesthesia    Father had rheumatic fever, and died on operating table.   GERD (gastroesophageal reflux disease)    Kidney cysts 01/24/2020   Right Kidney   Migraine    Moderate persistent asthma 09/05/2021   Smokers' cough (HCC)    per pt not productive   SUI (stress urinary incontinence, female)    Tubular adenoma of colon 07/08/2020   Wears glasses     Past Surgical History:  Procedure Laterality Date   ABDOMINAL HYSTERECTOMY N/A    Phreesia 11/30/2019   CARPAL TUNNEL RELEASE Right 09-12-2007   @[facility]    and GANGLION CYST EXCISION   COLONOSCOPY     CYSTOSCOPY N/A 04/29/2019   Procedure: CYSTOSCOPY;  Surgeon: [name], MD;  Location: [facility];  Service: Gynecology;  Laterality: N/A;   DILATATION & CURETTAGE/HYSTEROSCOPY WITH MYOSURE N/A 11/13/2018   Procedure: DILATATION & CURETTAGE/HYSTEROSCOPY WITH MYOSURE;  Surgeon: [name], MD;  Location: [facility];  Service: Gynecology;  Laterality: N/A;  request 9:00am OR start time in [place] block requests one hour   DILATION AND CURETTAGE OF UTERUS   02/2019   EYE SURGERY N/A    Phreesia 11/30/2019   GANGLION CYST EXCISION     R wrist   interstem      implant for overactive bladder   KNEE ARTHROSCOPY WITH ANTERIOR CRUCIATE LIGAMENT (ACL) REPAIR Right 10/2015   LAPAROSCOPIC CHOLECYSTECTOMY  1997   LAPAROSCOPIC HYSTERECTOMY Bilateral 04/29/2019   Procedure: HYSTERECTOMY TOTAL LAPAROSCOPIC, BILATERAL SALPINO-OOPHORECTOMY;  Surgeon: [name], MD;  Location[facility];  Service: Gynecology;  Laterality: Bilateral;   LAPAROSCOPIC TOTAL HYSTERECTOMY     TOOTH EXTRACTION  04/01/2019   tooth removal Bilateral 2022   4 teeth removed   UPPER GASTROINTESTINAL ENDOSCOPY      Family Psychiatric History:   Family History:  Family History  Problem Relation Age of Onset   Depression Mother    Cancer Mother        Cervical   Alcohol abuse Father    Crohn's disease Sister    Stroke Brother 28   Migraines Daughter    GER disease Daughter    Depression Daughter    Migraines Daughter    GER disease Daughter    Colon cancer Neg Hx    Rectal cancer Neg Hx    Stomach cancer Neg Hx     Social History:   Social History   Socioeconomic History   Marital status: Divorced    Spouse name: n/a   Number of children: 2   Years of education: 18   Highest education level: Not on file  Occupational History   Occupation: Disabled    Comment: Formerly a Runner, broadcasting/film/video.  Tobacco Use   Smoking status: Former    Current packs/day: 0.00    Average packs/day: 1 pack/day for 39.8 years (39.8 ttl pk-yrs)    Types: Cigarettes    Start date: 10/10/1981    Quit date: 07/18/2021    Years since quitting: 1.6    Passive exposure: Never   Smokeless tobacco: Never  Vaping Use   Vaping status: Never Used  Substance and Sexual Activity   Alcohol use: Never    Alcohol/week: 0.0 standard drinks of alcohol   Drug use: Never   Sexual activity: Not Currently    Partners: Male    Birth control/protection: Post-menopausal    Comment: 1st  intercourse 53 yo-More than 5 partners  Other Topics Concern   Not on file  Social History Narrative   Lives at home with 1 of her two daughters   Right-handed.   2-4 cups caffeine daily.   Disabled    One story home   Social Drivers of Health   Financial Resource Strain: Low Risk  (01/09/2023)   Overall Financial Resource Strain (CARDIA)    Difficulty of Paying Living Expenses: Not very hard  Food Insecurity: Food Insecurity Present (03/02/2023)   Hunger Vital Sign    Worried About Running Out of Food in the Last Year: Sometimes true    Ran Out of Food in the Last Year: Sometimes true  Transportation Needs: No Transportation Needs (03/02/2023)   PRAPARE - Administrator, Civil Service (Medical): No    Lack of Transportation (Non-Medical): No  Physical Activity: Inactive (01/09/2023)   Exercise Vital Sign    Days of Exercise per Week: 0 days    Minutes of Exercise per Session: 0 min  Stress: No Stress Concern Present (02/15/2023)  Received from [facility] of Occupational Health - Occupational Stress Questionnaire    Feeling of Stress : Only a little  Recent Concern: Stress - Stress Concern Present (02/05/2023)   Received from [facility] of Occupational Health - Occupational Stress Questionnaire    Feeling of Stress : To some extent  Social Connections: Socially Isolated (01/09/2023)   Social Connection and Isolation Panel [NHANES]    Frequency of Communication with Friends and Family: Never    Frequency of Social Gatherings with Friends and Family: Never    Attends Religious Services: Never    Database administrator or Organizations: No    Attends Banker Meetings: Never    Marital Status: Divorced    Additional Social History:   Allergies:   Allergies  Allergen Reactions   Codeine Anaphylaxis and Other (See Comments)    Cannot have ANYTHING with codeine    Metabolic Disorder Labs: Lab Results   Component Value Date   HGBA1C 5.8 05/10/2022   No results found for: "PROLACTIN" Lab Results  Component Value Date   CHOL 153 11/07/2021   TRIG 134.0 11/07/2021   HDL 52.50 11/07/2021   CHOLHDL 3 11/07/2021   VLDL 26.8 11/07/2021   LDLCALC 73 11/07/2021   LDLCALC 99 05/27/2021   Lab Results  Component Value Date   TSH 1.33 07/07/2022    Therapeutic Level Labs: No results found for: "LITHIUM" No results found for: "CBMZ" No results found for: "VALPROATE"  Current Medications: Current Outpatient Medications  Medication Sig Dispense Refill   albuterol (VENTOLIN HFA) 108 (90 Base) MCG/ACT inhaler Inhale 2 puffs into the lungs every 4 (four) hours as needed for wheezing or shortness of breath. 1 each 1   ALPRAZolam (XANAX) 1 MG tablet Take 1 mg by mouth 2 (two) times daily as needed. (Patient not taking: Reported on 03/02/2023)     amoxicillin (AMOXIL) 500 MG capsule Take 500 mg by mouth every 8 (eight) hours. (Patient not taking: Reported on 03/02/2023)     ARIPiprazole (ABILIFY) 30 MG tablet Take 30 mg by mouth at bedtime. (Patient not taking: Reported on 03/02/2023)     benztropine (COGENTIN) 0.5 MG tablet Take by mouth. (Patient not taking: Reported on 03/02/2023)     budesonide-formoterol (SYMBICORT) 160-4.5 MCG/ACT inhaler Inhale 2 puffs into the lungs 2 (two) times daily. 1 each 6   buPROPion (WELLBUTRIN XL) 150 MG 24 hr tablet Take 150 mg by mouth every morning. (Patient not taking: Reported on 03/02/2023)     busPIRone (BUSPAR) 15 MG tablet Take 15 mg by mouth 3 (three) times daily.     carbamazepine (TEGRETOL) 100 MG chewable tablet Chew by mouth.     citalopram (CELEXA) 20 MG tablet Take 20 mg by mouth daily.     diclofenac (VOLTAREN) 75 MG EC tablet Take 1 tablet (75 mg total) by mouth 2 (two) times daily. (Patient not taking: Reported on 03/02/2023) 50 tablet 2   EPINEPHrine 0.3 mg/0.3 mL IJ SOAJ injection Inject 0.3 mg into the muscle as needed for anaphylaxis. 1 each 0    esomeprazole (NEXIUM) 20 MG capsule Take 1 capsule (20 mg total) by mouth 2 (two) times daily before a meal. 60 capsule 1   gabapentin (NEURONTIN) 800 MG tablet Take 800 mg by mouth 3 (three) times daily.      hydrOXYzine (VISTARIL) 50 MG capsule Take by mouth.     ibuprofen (ADVIL) 800 MG tablet Take  800 mg by mouth every 8 (eight) hours as needed.     lamoTRIgine (LAMICTAL) 200 MG tablet Take 200 mg by mouth 2 (two) times daily. (Patient not taking: Reported on 03/02/2023)     MYRBETRIQ 50 MG TB24 tablet Take 50 mg by mouth daily.     ondansetron (ZOFRAN) 4 MG tablet Take 1 tablet (4 mg total) by mouth every 8 (eight) hours as needed for nausea or vomiting. 20 tablet 6   pantoprazole (PROTONIX) 40 MG tablet Take by mouth.     predniSONE (DELTASONE) 10 MG tablet 12 day tapering dose (Patient not taking: Reported on 03/02/2023) 48 tablet 0   Rimegepant Sulfate (NURTEC) 75 MG TBDP Take 1 tablet (75 mg total) by mouth daily as needed. 8 tablet 11   SUMAtriptan (IMITREX) 6 MG/0.5ML SOLN injection Inject 0.5 mLs (6 mg total) into the skin See admin instructions. INJECT 6 MG (0.5 ml) INTO THE SKIN AS NEEDED FOR MIGRAINE OR HEADACHE. MAY REPEAT ONCE IN 2 HOURS IF HEADACHE PERSISTS OR RECURS. MAX 2 DOSES IN 24 HOURS. 3 mL 5   thiothixene (NAVANE) 5 MG capsule Take 5 mg by mouth 3 (three) times daily.     tiZANidine (ZANAFLEX) 4 MG tablet Take 1 tablet (4 mg total) by mouth every 6 (six) hours as needed for muscle spasms. 30 tablet 5   traMADol (ULTRAM) 50 MG tablet Take 50 mg by mouth every 6 (six) hours as needed. (Patient not taking: Reported on 03/02/2023)     No current facility-administered medications for this visit.    Musculoskeletal:   Psychiatric Specialty Exam: Review of Systems  Psychiatric/Behavioral:  Positive for agitation. Suicidal ideas: intermitten thoughts.The patient is nervous/anxious.     Last menstrual period 05/02/2019.There is no height or weight on file to calculate BMI.   General Appearance: Casual  Eye Contact:  Good  Speech:  Clear and Coherent  Volume:  Normal  Mood:  Anxious and Depressed  Affect:  Congruent  Thought Process:  Coherent  Orientation:  Full (Time, Place, and Person)  Thought Content:  Logical  Suicidal Thoughts:  No  Homicidal Thoughts:  No  Memory:  Immediate;   Good Recent;   Good  Judgement:  Good  Insight:  Good  Psychomotor Activity:  Normal  Concentration:  Concentration: Good  Recall:  Good  Fund of Knowledge:Good  Language: Good  Akathisia:  No  Handed:  Right  AIMS (if indicated):  not done  Assets:  Communication Skills Desire for Improvement Social Support Talents/Skills  ADL's:  Intact  Cognition: WNL  Sleep:  Good   Screenings: GAD-7    Flowsheet Row Office Visit from 03/02/2023 in [facility] [place] [facility] Video Visit from 03/01/2023 in BEHAVIORAL HEALTH PARTIAL HOSPITALIZATION PROGRAM Office Visit from 09/11/2022 in [facility] [place] [facility] Visit from 08/18/2022 in [facility] [place] [facility] Visit from 07/07/2022 in [facility] at [facility]  Total GAD-7 Score 15 16 14 21 20       PHQ2-9    Flowsheet Row Office Visit from 03/02/2023 in [facility] Video Visit from 03/01/2023 in BEHAVIORAL HEALTH PARTIAL HOSPITALIZATION PROGRAM Clinical Support from 01/09/2023 in [facility] [place] [facility] Visit from 09/11/2022 in [facility] [place] [facility] Visit from 08/18/2022 in [facility] [place] [facility] Total Score 5 6 5 3  3  PHQ-9 Total Score 13 18 12 16 11       Flowsheet Row Video Visit from 03/01/2023 in BEHAVIORAL HEALTH PARTIAL HOSPITALIZATION PROGRAM ED from 01/18/2023 in [facility] Emergency Department at [facility] Clinical Support from 01/09/2023  in [facility] at [facility]  C-SSRS RISK CATEGORY High Risk No Risk Error: Q3, 4, or 5 should not be populated when Q2 is No       Assessment and Plan:  Orders placed for occupational therapy Patient to start partial hospitalization programming Continue medications as directed  Collaboration of Care: patient enrolled in Partial Hospitalization Program, patient's current medications are to be continued, the following medications are being continued a comprehensive treatment plan will be developed and side effects of medications have been reviewed with patient  Treatment options and alternatives reviewed with patient and patient understands the above plan. Treatment plan was reviewed and agreed upon by NP T.Melvyn Neth and patient Lauren Chapman for group services.  Patient/Guardian was advised Release of Information must be obtained prior to any record release in order to collaborate their care with an outside provider. Patient/Guardian was advised if they have not already done so to contact the registration department to sign all necessary forms in order for [place] to release information regarding their care.   Consent: Patient/Guardian gives verbal consent for treatment and assignment of benefits for services provided during this visit. Patient/Guardian expressed understanding and agreed to proceed.   [name], NP 2/12/20259:20 AM

## 2023-03-07 ENCOUNTER — Other Ambulatory Visit (HOSPITAL_COMMUNITY): Payer: 59

## 2023-03-07 ENCOUNTER — Encounter (HOSPITAL_COMMUNITY): Payer: Self-pay

## 2023-03-07 ENCOUNTER — Other Ambulatory Visit (HOSPITAL_COMMUNITY): Payer: 59 | Admitting: Licensed Clinical Social Worker

## 2023-03-07 DIAGNOSIS — R4589 Other symptoms and signs involving emotional state: Secondary | ICD-10-CM

## 2023-03-07 DIAGNOSIS — F333 Major depressive disorder, recurrent, severe with psychotic symptoms: Secondary | ICD-10-CM

## 2023-03-07 DIAGNOSIS — F259 Schizoaffective disorder, unspecified: Secondary | ICD-10-CM

## 2023-03-07 NOTE — Progress Notes (Signed)
Spoke with patient via Teams video call, used 2 identifiers to correctly identify patient. States that she was inpatient 5 days at [facility] for having suicidal thoughts and severe depression. She has not been able to work because they have cut her hours back due to her mental health. She lives with her daughter who is moving back to [place] in October with her husband, worries a lot and feels overwhelmed. Does have auditory/visual hallucinations at times. See's shadows and hears voices that laugh or call her name. Sometimes they tell her to hurt herself but not often. Denies feeling suicidal or homicidal. On scale 1-10 as 10 being worst she rates depression at 7 and anxiety at 9. Feels more anxious today because she has to visit her mother who is very stressful for her. PHQ9=16. No issues or complaints. No side effects from medication. Groups are going well.

## 2023-03-08 ENCOUNTER — Other Ambulatory Visit (HOSPITAL_COMMUNITY): Payer: 59 | Attending: Psychiatry | Admitting: Licensed Clinical Social Worker

## 2023-03-08 ENCOUNTER — Other Ambulatory Visit (HOSPITAL_COMMUNITY): Payer: 59

## 2023-03-08 DIAGNOSIS — R4589 Other symptoms and signs involving emotional state: Secondary | ICD-10-CM

## 2023-03-08 DIAGNOSIS — F259 Schizoaffective disorder, unspecified: Secondary | ICD-10-CM | POA: Insufficient documentation

## 2023-03-08 DIAGNOSIS — F250 Schizoaffective disorder, bipolar type: Secondary | ICD-10-CM | POA: Diagnosis not present

## 2023-03-09 ENCOUNTER — Other Ambulatory Visit (HOSPITAL_COMMUNITY): Payer: 59

## 2023-03-09 ENCOUNTER — Other Ambulatory Visit (HOSPITAL_COMMUNITY): Payer: 59 | Admitting: Licensed Clinical Social Worker

## 2023-03-09 DIAGNOSIS — Z760 Encounter for issue of repeat prescription: Secondary | ICD-10-CM | POA: Insufficient documentation

## 2023-03-09 DIAGNOSIS — F259 Schizoaffective disorder, unspecified: Secondary | ICD-10-CM | POA: Insufficient documentation

## 2023-03-09 DIAGNOSIS — Z79899 Other long term (current) drug therapy: Secondary | ICD-10-CM | POA: Insufficient documentation

## 2023-03-09 DIAGNOSIS — F333 Major depressive disorder, recurrent, severe with psychotic symptoms: Secondary | ICD-10-CM | POA: Diagnosis not present

## 2023-03-09 MED ORDER — BUSPIRONE HCL 15 MG PO TABS: 15.0000 mg | ORAL_TABLET | Freq: Three times a day (TID) | ORAL | 0 refills | Status: AC

## 2023-03-09 MED ORDER — GABAPENTIN 400 MG PO CAPS: 400.0000 mg | ORAL_CAPSULE | Freq: Two times a day (BID) | ORAL | 0 refills | Status: DC

## 2023-03-09 NOTE — Progress Notes (Addendum)
Patient request medication refill.  States she is unable to get into contact with her primary care psychiatrist ready.  States she is prescribed gabapentin 800 mg 3 times a day and BuSpar 15 mg 3 times a day.  Discussed will make gabapentin 400 mg p.o. twice daily available x 2 weeks and BuSpar 15 mg 3 times daily x 2 weeks.  She was receptive to plan as she states she is going to run out of medications over the weekend.  States she has been in contact with her medication coordinator.  Support, encouragement and  reassurance was provided.

## 2023-03-12 ENCOUNTER — Encounter (HOSPITAL_COMMUNITY): Payer: Self-pay

## 2023-03-12 ENCOUNTER — Other Ambulatory Visit (HOSPITAL_COMMUNITY): Payer: 59 | Admitting: Licensed Clinical Social Worker

## 2023-03-12 ENCOUNTER — Other Ambulatory Visit (HOSPITAL_COMMUNITY): Payer: 59

## 2023-03-12 ENCOUNTER — Telehealth: Payer: 59 | Admitting: Family Medicine

## 2023-03-12 DIAGNOSIS — F259 Schizoaffective disorder, unspecified: Secondary | ICD-10-CM

## 2023-03-12 DIAGNOSIS — F333 Major depressive disorder, recurrent, severe with psychotic symptoms: Secondary | ICD-10-CM | POA: Diagnosis not present

## 2023-03-12 DIAGNOSIS — R4589 Other symptoms and signs involving emotional state: Secondary | ICD-10-CM

## 2023-03-12 NOTE — Therapy (Signed)
[facility] PARTIAL HOSPITALIZATION PROGRAM [address] [place], [place], [address] Phone: [phone]   Fax:  [phone]  Occupational Therapy Evaluation Virtual Visit via Video Note  I connected with Lauren Chapman on 03/12/23 at  8:00 AM EST by a video enabled telemedicine application and verified that I am speaking with the correct person using two identifiers.  Location: Patient: home Provider: office   I discussed the limitations of evaluation and management by telemedicine and the availability of in person appointments. The patient expressed understanding and agreed to proceed.    The patient was advised to call back or seek an in-person evaluation if the symptoms worsen or if the condition fails to improve as anticipated.  I provided 85 minutes of non-face-to-face time during this encounter.   Patient Details  Name: Lauren Chapman MRN: [id number] Date of Birth: 09/26/1970 No data recorded  Encounter Date: 03/06/2023   OT End of Session - 03/12/23 1023     Visit Number 1    Number of Visits 20    Date for OT Re-Evaluation 04/11/23    OT Start Time 0930    OT Stop Time 1255   Eval: 30; Tx: 55   OT Time Calculation (min) 205 min    Activity Tolerance Patient tolerated treatment well    Behavior During Therapy WFL for tasks assessed/performed             Past Medical History:  Diagnosis Date   Allergy    Anxiety    Bipolar affective psychosis (HCC)    Complication of anesthesia    " Acting silly- waving at everyone"-Giddy   Depression    Emphysema lung (HCC)    inhaler prn,  followed by pcp and pulmonologist-- dr [name]   Family history of adverse reaction to anesthesia    Father had rheumatic fever, and died on operating table.   GERD (gastroesophageal reflux disease)    Kidney cysts 01/24/2020   Right Kidney   Migraine    Moderate persistent asthma 09/05/2021   Smokers' cough (HCC)    per pt not productive   SUI  (stress urinary incontinence, female)    Tubular adenoma of colon 07/08/2020   Wears glasses     Past Surgical History:  Procedure Laterality Date   ABDOMINAL HYSTERECTOMY N/A    Phreesia 11/30/2019   CARPAL TUNNEL RELEASE Right 09-12-2007   @[facility]    and GANGLION CYST EXCISION   COLONOSCOPY     CYSTOSCOPY N/A 04/29/2019   Procedure: CYSTOSCOPY;  Surgeon: [name], MD;  Location: [facility];  Service: Gynecology;  Laterality: N/A;   DILATATION & CURETTAGE/HYSTEROSCOPY WITH MYOSURE N/A 11/13/2018   Procedure: DILATATION & CURETTAGE/HYSTEROSCOPY WITH MYOSURE;  Surgeon: [name], MD;  Location: [facility];  Service: Gynecology;  Laterality: N/A;  request 9:00am OR start time in [place] [facility] block requests one hour   DILATION AND CURETTAGE OF UTERUS  02/2019   EYE SURGERY N/A    Phreesia 11/30/2019   GANGLION CYST EXCISION     R wrist   interstem      implant for overactive bladder   KNEE ARTHROSCOPY WITH ANTERIOR CRUCIATE LIGAMENT (ACL) REPAIR Right 10/2015   LAPAROSCOPIC CHOLECYSTECTOMY  1997   LAPAROSCOPIC HYSTERECTOMY Bilateral 04/29/2019   Procedure: HYSTERECTOMY TOTAL LAPAROSCOPIC, BILATERAL SALPINO-OOPHORECTOMY;  Surgeon: [name], MD;  Location: [facility];  Service: Gynecology;  Laterality: Bilateral;   LAPAROSCOPIC TOTAL HYSTERECTOMY  TOOTH EXTRACTION  04/01/2019   tooth removal Bilateral 2022   4 teeth removed   UPPER GASTROINTESTINAL ENDOSCOPY      There were no vitals filed for this visit.   Subjective Assessment - 03/12/23 1022     Subjective  I hope to learn new coping skills while I am here.    Pertinent History BPD, Schizoaffective Dx    Currently in Pain? No/denies    Pain Score 0-No pain                   OT Assessment   OCAIRS Mental Health Interview Summary of Client Scores:  Facilitates participation in occupation Allows participation in occupation Inhibits  participation in occupation Restricts participation in occupation Comments:  Roles   X    Habits    X   Personal Causation   X    Values  X     Interests  X     Skills   X    Short-Term Goals   X    Long-term Goals    X   Interpretation of Past Experiences   X    Physical Environment   X    Social Environment   X    Readiness for Change  X       Need for Occupational Therapy:  4 Shows positive occupational participation, no need for OT.   3 Need for minimal intervention/consultative participation   2 Need for OT intervention indicated to restore/improve participation   1 Need for extensive OT intervention indicated to improve participation.  Referral for follow up services also recommended.   Assessment:  Patient demonstrates behavior that INHIBITS participation in occupation.  Patient will benefit from occupational therapy intervention in order to improve time management, financial management, stress management, job readiness skills, social skills, and health management skills in preparation to return to full time community living and to be a productive community member.    Plan:  Patient will participate in skilled occupational therapy sessions individually or in a group setting to improve coping skills, psychosocial skills, and emotional skills required to return to prior level of function. Treatment will be 4-5 times per week for 4 weeks.       Group Session:  O: The primary objective of this topic is to explore and understand the concept of occupational balance in the context of daily living. The term "occupational balance" is defined broadly, encompassing all activities that occupy an individual's time and energy, including self-care, leisure, and work-related tasks. The goal is to guide participants towards achieving a harmonious blend of these activities, tailored to their personal values and life circumstances. This balance is aimed at enhancing overall well-being, not by  equally distributing time across activities, but by ensuring that daily engagements are fulfilling and not draining. The content delves into identifying various barriers that individuals face in achieving occupational balance, such as overcommitment, misaligned priorities, external pressures, and lack of effective time management. The impact of these barriers on occupational performance, roles, and lifestyles is examined, highlighting issues like reduced efficiency, strained relationships, and potential health problems. Strategies for cultivating occupational balance are a key focus. These strategies include practical methods like time blocking, prioritizing tasks, establishing self-care rituals, decluttering, connecting with nature, and engaging in reflective practices. These approaches are designed to be adaptable and applicable to a wide range of life scenarios, promoting a proactive and mindful approach to daily living. The overall aim is to equip participants with the knowledge  and tools to create a balanced lifestyle that supports their mental, emotional, and physical health, thereby improving their functional performance in daily life.   A:  The patient demonstrated a high level of engagement and active participation throughout the session on occupational balance. The patient frequently contributed to discussions, offering insightful reflections on personal experiences related to the barriers and strategies for achieving occupational balance. There was a clear understanding of the concept and an ability to relate it to their own life. The patient showed enthusiasm in learning and applying the strategies discussed, such as time blocking and self-care rituals, indicating a strong motivation to improve their occupational balance. The patient's proactive approach and responsiveness to the topic suggest a high potential for implementing these strategies effectively in their daily  routine.               OT Education - 03/12/23 1023     Education Details OCAIRS / Tx: Occupational Balance    Person(s) Educated Patient    Methods Explanation;Handout    Comprehension Verbalized understanding              OT Short Term Goals - 03/12/23 1025       OT SHORT TERM GOAL #1   Title Patient will be educated on strategies to improve psychosocial skills needed to participate fully in all daily, work, and leisure activities.    Time 4    Period Weeks    Status On-going    Target Date 04/11/23      OT SHORT TERM GOAL #2   Title Pt will apply psychosocial skills and coping mechanisms to daily activities in order to function independently and reintegrate into community dwelling    Status On-going      OT SHORT TERM GOAL #3   Title Pt will choose and/or engage in 1-3 socially engaging leisure activities to improve social participation skills upon reintegrating into community    Status On-going                      Plan - 03/12/23 1024     Clinical Impression Statement Pt presents w/ deficits across multiple psychosocial doamins that inhibit occupational performance.    OT Occupational Profile and History Problem Focused Assessment - Including review of records relating to presenting problem    Occupational performance deficits (Please refer to evaluation for details): Social Participation;Leisure;Rest and Sleep;IADL's    Psychosocial Skills Habits;Routines and Behaviors;Interpersonal Interaction;Coping Strategies    Rehab Potential Good    Clinical Decision Making Limited treatment options, no task modification necessary    Comorbidities Affecting Occupational Performance: None    Modification or Assistance to Complete Evaluation  No modification of tasks or assist necessary to complete eval    OT Frequency 5x / week    OT Duration 4 weeks    OT Treatment/Interventions Psychosocial skills training;Coping strategies training    Consulted and  Agree with Plan of Care Patient             Patient will benefit from skilled therapeutic intervention in order to improve the following deficits and impairments:       Psychosocial Skills: Habits, Routines and Behaviors, Interpersonal Interaction, Coping Strategies   Visit Diagnosis: Difficulty coping  Severe episode of recurrent major depressive disorder, with psychotic features (HCC)  Schizoaffective disorder, unspecified type [facility])    Problem List Patient Active Problem List   Diagnosis Date Noted   Schizoaffective disorder (HCC) 03/01/2023   Poorly controlled  mild persistent asthma 09/05/2021   Syncope 08/02/2021   Tobacco abuse 08/02/2021   Vaginal candidiasis 08/02/2021   CAP (community acquired pneumonia) 07/18/2021   OSA (obstructive sleep apnea) 07/18/2021   Hypokalemia 07/18/2021   Depression 03/24/2020   Bilateral low back pain with left-sided sciatica 03/24/2020   Left lumbar radiculopathy 02/25/2020   Absence of bladder continence 02/25/2020   History of postmenopausal bleeding 04/29/2019   Polypharmacy 04/03/2019   Emphysema, unspecified (HCC) 05/17/2018   Weakness 01/25/2017   Chronic migraine w/o aura w/o status migrainosus, not intractable 12/22/2014   Encounter for other general counseling or advice on contraception 09/02/2014   Bipolar disorder (HCC) 10/12/2011   Anxiety 10/12/2011   GERD (gastroesophageal reflux disease) 10/12/2011    Ted Mcalpine, OT 03/12/2023, 10:27 AM  Kerrin Champagne, OT   [facility] HOSPITALIZATION PROGRAM [address] [place], [place], [address] Phone: [phone]   Fax:  [phone]  Name: Lauren Chapman MRN: [id number] Date of Birth: 04-04-1970

## 2023-03-12 NOTE — Therapy (Signed)
[facility] PARTIAL HOSPITALIZATION PROGRAM [address] [place], [place], [address] Phone: [phone]   Fax:  [phone]  Occupational Therapy Treatment Virtual Visit via Video Note  I connected with Lauren Chapman on 03/12/23 at  8:00 AM EST by a video enabled telemedicine application and verified that I am speaking with the correct person using two identifiers.  Location: Patient: home Provider: office   I discussed the limitations of evaluation and management by telemedicine and the availability of in person appointments. The patient expressed understanding and agreed to proceed.    The patient was advised to call back or seek an in-person evaluation if the symptoms worsen or if the condition fails to improve as anticipated.  I provided 55 minutes of non-face-to-face time during this encounter.   Patient Details  Name: Lauren Chapman MRN: [id number] Date of Birth: 1970-08-31 No data recorded  Encounter Date: 03/07/2023   OT End of Session - 03/12/23 1043     Visit Number 2    Number of Visits 20    Date for OT Re-Evaluation 04/11/23    OT Start Time 1200    OT Stop Time 1255    OT Time Calculation (min) 55 min             Past Medical History:  Diagnosis Date   Allergy    Anxiety    Bipolar affective psychosis (HCC)    Complication of anesthesia    " Acting silly- waving at everyone"-Giddy   Depression    Emphysema lung (HCC)    inhaler prn,  followed by pcp and pulmonologist-- dr [name]   Family history of adverse reaction to anesthesia    Father had rheumatic fever, and died on operating table.   GERD (gastroesophageal reflux disease)    Kidney cysts 01/24/2020   Right Kidney   Migraine    Moderate persistent asthma 09/05/2021   Smokers' cough (HCC)    per pt not productive   SUI (stress urinary incontinence, female)    Tubular adenoma of colon 07/08/2020   Wears glasses     Past Surgical History:  Procedure  Laterality Date   ABDOMINAL HYSTERECTOMY N/A    Phreesia 11/30/2019   CARPAL TUNNEL RELEASE Right 09-12-2007   @[facility]    and GANGLION CYST EXCISION   COLONOSCOPY     CYSTOSCOPY N/A 04/29/2019   Procedure: CYSTOSCOPY;  Surgeon: [name], MD;  Location: [facility];  Service: Gynecology;  Laterality: N/A;   DILATATION & CURETTAGE/HYSTEROSCOPY WITH MYOSURE N/A 11/13/2018   Procedure: DILATATION & CURETTAGE/HYSTEROSCOPY WITH MYOSURE;  Surgeon: [name], MD;  Location: [facility];  Service: Gynecology;  Laterality: N/A;  request 9:00am OR start time in [place] Gyn block requests one hour   DILATION AND CURETTAGE OF UTERUS  02/2019   EYE SURGERY N/A    Phreesia 11/30/2019   GANGLION CYST EXCISION     R wrist   interstem      implant for overactive bladder   KNEE ARTHROSCOPY WITH ANTERIOR CRUCIATE LIGAMENT (ACL) REPAIR Right 10/2015   LAPAROSCOPIC CHOLECYSTECTOMY  1997   LAPAROSCOPIC HYSTERECTOMY Bilateral 04/29/2019   Procedure: HYSTERECTOMY TOTAL LAPAROSCOPIC, BILATERAL SALPINO-OOPHORECTOMY;  Surgeon: [name], MD;  Location: [facility];  Service: Gynecology;  Laterality: Bilateral;   LAPAROSCOPIC TOTAL HYSTERECTOMY     TOOTH EXTRACTION  04/01/2019   tooth removal Bilateral 2022   4 teeth removed   UPPER GASTROINTESTINAL ENDOSCOPY  There were no vitals filed for this visit.   Subjective Assessment - 03/12/23 1043     Currently in Pain? No/denies    Pain Score 0-No pain               Group Session:  S: doing okay. Looking forward to learning new skills in this group.   O: The primary objective of this topic is to explore and understand the concept of occupational balance in the context of daily living. The term "occupational balance" is defined broadly, encompassing all activities that occupy an individual's time and energy, including self-care, leisure, and work-related tasks. The goal is to  guide participants towards achieving a harmonious blend of these activities, tailored to their personal values and life circumstances. This balance is aimed at enhancing overall well-being, not by equally distributing time across activities, but by ensuring that daily engagements are fulfilling and not draining. The content delves into identifying various barriers that individuals face in achieving occupational balance, such as overcommitment, misaligned priorities, external pressures, and lack of effective time management. The impact of these barriers on occupational performance, roles, and lifestyles is examined, highlighting issues like reduced efficiency, strained relationships, and potential health problems. Strategies for cultivating occupational balance are a key focus. These strategies include practical methods like time blocking, prioritizing tasks, establishing self-care rituals, decluttering, connecting with nature, and engaging in reflective practices. These approaches are designed to be adaptable and applicable to a wide range of life scenarios, promoting a proactive and mindful approach to daily living. The overall aim is to equip participants with the knowledge and tools to create a balanced lifestyle that supports their mental, emotional, and physical health, thereby improving their functional performance in daily life.   A:  The patient demonstrated a high level of engagement and active participation throughout the session on occupational balance. The patient frequently contributed to discussions, offering insightful reflections on personal experiences related to the barriers and strategies for achieving occupational balance. There was a clear understanding of the concept and an ability to relate it to their own life. The patient showed enthusiasm in learning and applying the strategies discussed, such as time blocking and self-care rituals, indicating a strong motivation to improve their  occupational balance. The patient's proactive approach and responsiveness to the topic suggest a high potential for implementing these strategies effectively in their daily routine.   P: Continue to attend PHP OT group sessions 5x week for 4 weeks to promote daily structure, social engagement, and opportunities to develop and utilize adaptive strategies to maximize functional performance in preparation for safe transition and integration back into school, work, and the community. Plan to address topic of pt 3 in next OT group session.                    OT Education - 03/12/23 1043     Education Details Occupational Balance 2              OT Short Term Goals - 03/12/23 1025       OT SHORT TERM GOAL #1   Title Patient will be educated on strategies to improve psychosocial skills needed to participate fully in all daily, work, and leisure activities.    Time 4    Period Weeks    Status On-going    Target Date 04/11/23      OT SHORT TERM GOAL #2   Title Pt will apply psychosocial skills and coping mechanisms to daily activities in  order to function independently and reintegrate into community dwelling    Status On-going      OT SHORT TERM GOAL #3   Title Pt will choose and/or engage in 1-3 socially engaging leisure activities to improve social participation skills upon reintegrating into community    Status On-going                      Plan - 03/12/23 1043     Psychosocial Skills Habits;Routines and Behaviors;Interpersonal Interaction;Coping Strategies             Patient will benefit from skilled therapeutic intervention in order to improve the following deficits and impairments:       Psychosocial Skills: Habits, Routines and Behaviors, Interpersonal Interaction, Coping Strategies   Visit Diagnosis: Difficulty coping    Problem List Patient Active Problem List   Diagnosis Date Noted   Schizoaffective disorder (HCC) 03/01/2023    Poorly controlled mild persistent asthma 09/05/2021   Syncope 08/02/2021   Tobacco abuse 08/02/2021   Vaginal candidiasis 08/02/2021   CAP (community acquired pneumonia) 07/18/2021   OSA (obstructive sleep apnea) 07/18/2021   Hypokalemia 07/18/2021   Depression 03/24/2020   Bilateral low back pain with left-sided sciatica 03/24/2020   Left lumbar radiculopathy 02/25/2020   Absence of bladder continence 02/25/2020   History of postmenopausal bleeding 04/29/2019   Polypharmacy 04/03/2019   Emphysema, unspecified (HCC) 05/17/2018   Weakness 01/25/2017   Chronic migraine w/o aura w/o status migrainosus, not intractable 12/22/2014   Encounter for other general counseling or advice on contraception 09/02/2014   Bipolar disorder (HCC) 10/12/2011   Anxiety 10/12/2011   GERD (gastroesophageal reflux disease) 10/12/2011    Ted Mcalpine, OT 03/12/2023, 10:44 AM  Kerrin Champagne, OT   [facility] HOSPITALIZATION PROGRAM [address] [place], [place], [address] Phone: [phone]   Fax:  [phone]  Name: DANYELA POSAS MRN: [id number] Date of Birth: 1970/07/16

## 2023-03-13 ENCOUNTER — Other Ambulatory Visit (HOSPITAL_COMMUNITY): Payer: 59 | Admitting: Licensed Clinical Social Worker

## 2023-03-13 ENCOUNTER — Encounter (HOSPITAL_COMMUNITY): Payer: Self-pay

## 2023-03-13 ENCOUNTER — Other Ambulatory Visit (HOSPITAL_COMMUNITY): Payer: 59

## 2023-03-13 DIAGNOSIS — F333 Major depressive disorder, recurrent, severe with psychotic symptoms: Secondary | ICD-10-CM

## 2023-03-13 DIAGNOSIS — Z562 Threat of job loss: Secondary | ICD-10-CM | POA: Insufficient documentation

## 2023-03-13 DIAGNOSIS — F250 Schizoaffective disorder, bipolar type: Secondary | ICD-10-CM | POA: Insufficient documentation

## 2023-03-13 DIAGNOSIS — F419 Anxiety disorder, unspecified: Secondary | ICD-10-CM | POA: Insufficient documentation

## 2023-03-13 DIAGNOSIS — Z79899 Other long term (current) drug therapy: Secondary | ICD-10-CM | POA: Insufficient documentation

## 2023-03-13 DIAGNOSIS — F259 Schizoaffective disorder, unspecified: Secondary | ICD-10-CM

## 2023-03-13 DIAGNOSIS — R4589 Other symptoms and signs involving emotional state: Secondary | ICD-10-CM

## 2023-03-13 NOTE — Progress Notes (Signed)
Virtual Visit via Video Note  I connected with Lauren Chapman on 03/13/23 at  9:00 AM EST by a video enabled telemedicine application and verified that I am speaking with the correct person using two identifiers.  Location: Patient: Home Provider: Office    I discussed the limitations of evaluation and management by telemedicine and the availability of in person appointments. The patient expressed understanding and agreed to proceed.   I discussed the assessment and treatment plan with the patient. The patient was provided an opportunity to ask questions and all were answered. The patient agreed with the plan and demonstrated an understanding of the instructions.   The patient was advised to call back or seek an in-person evaluation if the symptoms worsen or if the condition fails to improve as anticipated.  I provided 15 minutes of non-face-to-face time during this encounter.   [name], NP   BH MD/PA/NP OP Progress Note  03/13/2023 1:29 PM Lauren Chapman  MRN:  [id number]  Chief Complaint:  Lauren Chapman started " I have been under the weather." HPI: Lauren Chapman seen and evaluated for weekly progress assessment.  She reports low mood and increased depression and anxiety.  She attributes her symptoms to being sick. Ie upper respiratory infection.  He reports symptoms of worry related to " losing my job."  States she has a follow-up appointment with her outpatient psych provider with [facility] for medication management on the 26th of this month.  Patient was provided a bridge of medication until follow-up appointment.  No other documented concerns related to suicidal or homicidal ideations   Lauren Chapman is sitting she is alert/oriented x 4;   She presents flat, blunted but pleasant.  calm/cooperative; and mood congruent with affect.  Patient is speaking in a clear tone at moderate volume, and normal pace; with good eye contact. His thought process is coherent and relevant;    There is no indication that he is currently responding to internal/external stimuli or experiencing delusional thought content.  However, does report chronic auditory hallucinations that "come and go"   patient denies suicidal/self-harm/homicidal ideation, psychosis, and paranoia.  Patient has remained calm throughout assessment and has answered questions appropriately.    Visit Diagnosis:    ICD-10-CM   1. Schizoaffective disorder, unspecified type (HCC)  F25.9     2. Severe episode of recurrent major depressive disorder, with psychotic features (HCC)  F33.3       Past Psychiatric History:   Past Medical History:  Past Medical History:  Diagnosis Date   Allergy    Anxiety    Bipolar affective psychosis (HCC)    Complication of anesthesia    " Acting silly- waving at everyone"-Giddy   Depression    Emphysema lung (HCC)    inhaler prn,  followed by pcp and pulmonologist-- dr [name]   Family history of adverse reaction to anesthesia    Father had rheumatic fever, and died on operating table.   GERD (gastroesophageal reflux disease)    Kidney cysts 01/24/2020   Right Kidney   Migraine    Moderate persistent asthma 09/05/2021   Smokers' cough (HCC)    per pt not productive   SUI (stress urinary incontinence, female)    Tubular adenoma of colon 07/08/2020   Wears glasses     Past Surgical History:  Procedure Laterality Date   ABDOMINAL HYSTERECTOMY N/A    Phreesia 11/30/2019   CARPAL TUNNEL RELEASE Right 09-12-2007   @[facility]    and GANGLION  CYST EXCISION   COLONOSCOPY     CYSTOSCOPY N/A 04/29/2019   Procedure: CYSTOSCOPY;  Surgeon: [name], MD;  Location: [facility];  Service: Gynecology;  Laterality: N/A;   DILATATION & CURETTAGE/HYSTEROSCOPY WITH MYOSURE N/A 11/13/2018   Procedure: DILATATION & CURETTAGE/HYSTEROSCOPY WITH MYOSURE;  Surgeon: [name], MD;  Location: [facility];  Service: Gynecology;  Laterality: N/A;   request 9:00am OR start time in [place] Gyn block requests one hour   DILATION AND CURETTAGE OF UTERUS  02/2019   EYE SURGERY N/A    Phreesia 11/30/2019   GANGLION CYST EXCISION     R wrist   interstem      implant for overactive bladder   KNEE ARTHROSCOPY WITH ANTERIOR CRUCIATE LIGAMENT (ACL) REPAIR Right 10/2015   LAPAROSCOPIC CHOLECYSTECTOMY  1997   LAPAROSCOPIC HYSTERECTOMY Bilateral 04/29/2019   Procedure: HYSTERECTOMY TOTAL LAPAROSCOPIC, BILATERAL SALPINO-OOPHORECTOMY;  Surgeon: [name], MD;  Location: [facility] [place];  Service: Gynecology;  Laterality: Bilateral;   LAPAROSCOPIC TOTAL HYSTERECTOMY     TOOTH EXTRACTION  04/01/2019   tooth removal Bilateral 2022   4 teeth removed   UPPER GASTROINTESTINAL ENDOSCOPY      Family Psychiatric History:   Family History:  Family History  Problem Relation Age of Onset   Depression Mother    Cancer Mother        Cervical   Alcohol abuse Father    Crohn's disease Sister    Stroke Brother 28   Migraines Daughter    GER disease Daughter    Depression Daughter    Bipolar disorder Daughter    Migraines Daughter    GER disease Daughter    Colon cancer Neg Hx    Rectal cancer Neg Hx    Stomach cancer Neg Hx     Social History:  Social History   Socioeconomic History   Marital status: Divorced    Spouse name: n/a   Number of children: 2   Years of education: 18   Highest education level: Master's degree (e.g., MA, MS, MEng, MEd, MSW, MBA)  Occupational History   Occupation: Disabled    Comment: Formerly a Runner, broadcasting/film/video.  Tobacco Use   Smoking status: Former    Current packs/day: 0.00    Average packs/day: 1 pack/day for 39.8 years (39.8 ttl pk-yrs)    Types: Cigarettes    Start date: 10/10/1981    Quit date: 07/18/2021    Years since quitting: 1.6    Passive exposure: Never   Smokeless tobacco: Never  Vaping Use   Vaping status: Never Used  Substance and Sexual Activity   Alcohol use: Never     Alcohol/week: 0.0 standard drinks of alcohol   Drug use: Never   Sexual activity: Not Currently    Partners: Male    Birth control/protection: Post-menopausal    Comment: 1st intercourse 53 yo-More than 5 partners  Other Topics Concern   Not on file  Social History Narrative   Lives at home with 1 of her two daughters   Right-handed.   2-4 cups caffeine daily.   Disabled    One story home   Social Drivers of Health   Financial Resource Strain: Low Risk  (01/09/2023)   Overall Financial Resource Strain (CARDIA)    Difficulty of Paying Living Expenses: Not very hard  Food Insecurity: Food Insecurity Present (03/02/2023)   Hunger Vital Sign    Worried About Running Out of Food in the Last Year: Sometimes true  Ran Out of Food in the Last Year: Sometimes true  Transportation Needs: No Transportation Needs (03/02/2023)   PRAPARE - Administrator, Civil Service (Medical): No    Lack of Transportation (Non-Medical): No  Physical Activity: Inactive (01/09/2023)   Exercise Vital Sign    Days of Exercise per Week: 0 days    Minutes of Exercise per Session: 0 min  Stress: No Stress Concern Present (02/15/2023)   Received from [facility] Occupational [facility] - Occupational Stress Questionnaire    Feeling of Stress : Only a little  Recent Concern: Stress - Stress Concern Present (02/05/2023)   Received from [facility] - Occupational Stress Questionnaire    Feeling of Stress : To some extent  Social Connections: Socially Isolated (01/09/2023)   Social Connection and Isolation Panel [NHANES]    Frequency of Communication with Friends and Family: Never    Frequency of Social Gatherings with Friends and Family: Never    Attends Religious Services: Never    Database administrator or Organizations: No    Attends Banker Meetings: Never    Marital Status: Divorced    Allergies:  Allergies   Allergen Reactions   Codeine Anaphylaxis and Other (See Comments)    Cannot have ANYTHING with codeine    Metabolic Disorder Labs: Lab Results  Component Value Date   HGBA1C 5.8 05/10/2022   No results found for: "PROLACTIN" Lab Results  Component Value Date   CHOL 153 11/07/2021   TRIG 134.0 11/07/2021   HDL 52.50 11/07/2021   CHOLHDL 3 11/07/2021   VLDL 26.8 11/07/2021   LDLCALC 73 11/07/2021   LDLCALC 99 05/27/2021   Lab Results  Component Value Date   TSH 1.33 07/07/2022   TSH 0.86 09/09/2018    Therapeutic Level Labs: No results found for: "LITHIUM" No results found for: "VALPROATE" No results found for: "CBMZ"  Current Medications: Current Outpatient Medications  Medication Sig Dispense Refill   albuterol (VENTOLIN HFA) 108 (90 Base) MCG/ACT inhaler Inhale 2 puffs into the lungs every 4 (four) hours as needed for wheezing or shortness of breath. 1 each 1   ALPRAZolam (XANAX) 1 MG tablet Take 1 mg by mouth 2 (two) times daily as needed. (Patient not taking: Reported on 03/07/2023)     amoxicillin (AMOXIL) 500 MG capsule Take 500 mg by mouth every 8 (eight) hours. (Patient not taking: Reported on 03/07/2023)     ARIPiprazole (ABILIFY) 30 MG tablet Take 30 mg by mouth at bedtime. (Patient not taking: Reported on 03/02/2023)     benztropine (COGENTIN) 0.5 MG tablet Take by mouth. (Patient not taking: Reported on 03/07/2023)     budesonide-formoterol (SYMBICORT) 160-4.5 MCG/ACT inhaler Inhale 2 puffs into the lungs 2 (two) times daily. 1 each 6   buPROPion (WELLBUTRIN XL) 150 MG 24 hr tablet Take 150 mg by mouth every morning. (Patient not taking: Reported on 03/07/2023)     busPIRone (BUSPAR) 15 MG tablet Take 1 tablet (15 mg total) by mouth 3 (three) times daily. 30 tablet 0   carbamazepine (TEGRETOL) 100 MG chewable tablet Chew by mouth.     citalopram (CELEXA) 20 MG tablet Take 20 mg by mouth daily.     diclofenac (VOLTAREN) 75 MG EC tablet Take 1 tablet (75 mg total) by  mouth 2 (two) times daily. (Patient not taking: Reported on 03/07/2023) 50 tablet 2   EPINEPHrine 0.3 mg/0.3 mL  IJ SOAJ injection Inject 0.3 mg into the muscle as needed for anaphylaxis. 1 each 0   esomeprazole (NEXIUM) 20 MG capsule Take 1 capsule (20 mg total) by mouth 2 (two) times daily before a meal. 60 capsule 1   gabapentin (NEURONTIN) 400 MG capsule Take 1 capsule (400 mg total) by mouth 2 (two) times daily. 20 capsule 0   ibuprofen (ADVIL) 800 MG tablet Take 800 mg by mouth every 8 (eight) hours as needed.     lamoTRIgine (LAMICTAL) 200 MG tablet Take 200 mg by mouth 2 (two) times daily. (Patient not taking: Reported on 03/02/2023)     MYRBETRIQ 50 MG TB24 tablet Take 50 mg by mouth daily.     ondansetron (ZOFRAN) 4 MG tablet Take 1 tablet (4 mg total) by mouth every 8 (eight) hours as needed for nausea or vomiting. 20 tablet 6   pantoprazole (PROTONIX) 40 MG tablet Take by mouth. (Patient not taking: Reported on 03/07/2023)     predniSONE (DELTASONE) 10 MG tablet 12 day tapering dose (Patient not taking: Reported on 03/07/2023) 48 tablet 0   Rimegepant Sulfate (NURTEC) 75 MG TBDP Take 1 tablet (75 mg total) by mouth daily as needed. 8 tablet 11   SUMAtriptan (IMITREX) 6 MG/0.5ML SOLN injection Inject 0.5 mLs (6 mg total) into the skin See admin instructions. INJECT 6 MG (0.5 ml) INTO THE SKIN AS NEEDED FOR MIGRAINE OR HEADACHE. MAY REPEAT ONCE IN 2 HOURS IF HEADACHE PERSISTS OR RECURS. MAX 2 DOSES IN 24 HOURS. 3 mL 5   thiothixene (NAVANE) 5 MG capsule Take 5 mg by mouth 3 (three) times daily.     tiZANidine (ZANAFLEX) 4 MG tablet Take 1 tablet (4 mg total) by mouth every 6 (six) hours as needed for muscle spasms. 30 tablet 5   traMADol (ULTRAM) 50 MG tablet Take 50 mg by mouth every 6 (six) hours as needed. (Patient not taking: Reported on 03/07/2023)     No current facility-administered medications for this visit.     Musculoskeletal: Virtual platform  Psychiatric Specialty Exam: Review  of Systems  Psychiatric/Behavioral:  Negative for sleep disturbance and suicidal ideas. The patient is nervous/anxious.   All other systems reviewed and are negative.   Last menstrual period 05/02/2019.There is no height or weight on file to calculate BMI.  General Appearance: Casual  Eye Contact:  Good  Speech:  Clear and Coherent  Volume:  Normal  Mood:  Anxious and Depressed  Affect:  Congruent  Thought Process:  Coherent  Orientation:  Full (Time, Place, and Person)  Thought Content: Logical   Suicidal Thoughts:  No  Homicidal Thoughts:  No  Memory:  Recent;   Good Remote;   Good  Judgement:  Good  Insight:  Good  Psychomotor Activity:  Normal  Concentration:  Concentration: Good  Recall:  Good  Fund of Knowledge: Good  Language: Good  Akathisia:  No  Handed:  Right  AIMS (if indicated): done  Assets:  Communication Skills Desire for Improvement Social Support  ADL's:  Intact  Cognition: WNL  Sleep:  Good   Screenings: GAD-7    Flowsheet Row Office Visit from 03/02/2023 in [facility] Video Visit from 03/01/2023 [facility] Office Visit from 09/11/2022 in [facility] Visit from 08/18/2022 in [facility] Visit from 07/07/2022 in [facility] at [facility]  Total GAD-7  Score 15 16 14 21 20       PHQ2-9    Flowsheet Row Counselor from 03/07/2023 in [facility] PARTIAL HOSPITALIZATION PROGRAM Office Visit from 03/02/2023 in [facility] Video Visit from 03/01/2023 in [facility] PARTIAL HOSPITALIZATION PROGRAM Clinical Support from 01/09/2023 in [facility] Visit from 09/11/2022 in [facility] Total Score 6 5 6 5 3   PHQ-9 Total  Score 16 13 18 12 16       Flowsheet Row Counselor from 03/07/2023 in [facility] PART[facility] HOSPITALIZATION PROGRAM Video Visit from 03/01/2023 in [facility] PARTIAL HOSPITALIZATION PROGRAM ED from 01/18/2023 in [facility] Emergency Department at [facility]  C-SSRS RISK CATEGORY High Risk High Risk No Risk        Assessment and Plan:  Continue partial hospitalization program Continue medications as directed  Collaboration of Care: Collaboration of Care: Medication Management AEB keep outpatient follow-up appointment with [facility]  Patient/Guardian was advised Release of Information must be obtained prior to any record release in order to collaborate their care with an outside provider. Patient/Guardian was advised if they have not already done so to contact the registration department to sign all necessary forms in order for [place] to release information regarding their care.   Consent: Patient/Guardian gives verbal consent for treatment and assignment of benefits for services provided during this visit. Patient/Guardian expressed understanding and agreed to proceed.    [name], NP 03/13/2023, 1:29 PM

## 2023-03-13 NOTE — Therapy (Signed)
[facility] PARTIAL HOSPITALIZATION PROGRAM [address] [place], [place], [address] Phone: [phone]   Fax:  [phone]  Occupational Therapy Treatment Virtual Visit via Video Note  I connected with Lauren Chapman on 03/13/23 at  8:00 AM EST by a video enabled telemedicine application and verified that I am speaking with the correct person using two identifiers.  Location: Patient: home Provider: office   I discussed the limitations of evaluation and management by telemedicine and the availability of in person appointments. The patient expressed understanding and agreed to proceed.    The patient was advised to call back or seek an in-person evaluation if the symptoms worsen or if the condition fails to improve as anticipated.  I provided 55 minutes of non-face-to-face time during this encounter.   Patient Details  Name: Lauren Chapman MRN: [id number] Date of Birth: [birth year] No data recorded  Encounter Date: 03/08/2023   OT End of Session - 03/13/23 0933     Visit Number 3    Number of Visits 20    Date for OT Re-Evaluation 04/11/23    OT Start Time 1200    OT Stop Time 1255    OT Time Calculation (min) 55 min             Past Medical History:  Diagnosis Date   Allergy    Anxiety    Bipolar affective psychosis (HCC)    Complication of anesthesia    " Acting silly- waving at everyone"-Giddy   Depression    Emphysema lung (HCC)    inhaler prn,  followed by pcp and pulmonologist-- dr [name]   Family history of adverse reaction to anesthesia    Father had rheumatic fever, and died on operating table.   GERD (gastroesophageal reflux disease)    Kidney cysts 01/24/2020   Right Kidney   Migraine    Moderate persistent asthma 09/05/2021   Smokers' cough (HCC)    per pt not productive   SUI (stress urinary incontinence, female)    Tubular adenoma of colon 07/08/2020   Wears glasses     Past Surgical History:  Procedure  Laterality Date   ABDOMINAL HYSTERECTOMY N/A    Phreesia 11/30/2019   CARPAL TUNNEL RELEASE Right 09-12-2007   @[facility]    and GANGLION CYST EXCISION   COLONOSCOPY     CYSTOSCOPY N/A 04/29/2019   Procedure: CYSTOSCOPY;  Surgeon: [name], MD;  [facility];  Service: Gynecology;  Laterality: N/A;   DILATATION & CURETTAGE/HYSTEROSCOPY WITH MYOSURE N/A 11/13/2018   Procedure: DILATATION & CURETTAGE/HYSTEROSCOPY WITH MYOSURE;  Surgeon: [name], MD;  Location: [facility];  Service: Gynecology;  Laterality: N/A;  request 9:00am OR start time in [place] Gyn block requests one hour   DILATION AND CURETTAGE OF UTERUS  02/2019   EYE SURGERY N/A    Phreesia 11/30/2019   GANGLION CYST EXCISION     R wrist   interstem      implant for overactive bladder   KNEE ARTHROSCOPY WITH ANTERIOR CRUCIATE LIGAMENT (ACL) REPAIR Right 10/2015   LAPAROSCOPIC CHOLECYSTECTOMY  1997   LAPAROSCOPIC HYSTERECTOMY Bilateral 04/29/2019   Procedure: HYSTERECTOMY TOTAL LAPAROSCOPIC, BILATERAL SALPINO-OOPHORECTOMY;  Surgeon: [name], MD;  Location: [facility];  Service: Gynecology;  Laterality: Bilateral;   LAPAROSCOPIC TOTAL HYSTERECTOMY     TOOTH EXTRACTION  04/01/2019   tooth removal Bilateral 2022   4 teeth removed   UPPER GASTROINTESTINAL ENDOSCOPY  There were no vitals filed for this visit.   Subjective Assessment - 03/13/23 0933     Currently in Pain? No/denies    Pain Score 0-No pain                Group Session:  S: Doing a little better today.   O: The primary objective of this topic is to explore and understand the concept of occupational balance in the context of daily living. The term "occupational balance" is defined broadly, encompassing all activities that occupy an individual's time and energy, including self-care, leisure, and work-related tasks. The goal is to guide participants towards achieving a  harmonious blend of these activities, tailored to their personal values and life circumstances. This balance is aimed at enhancing overall well-being, not by equally distributing time across activities, but by ensuring that daily engagements are fulfilling and not draining. The content delves into identifying various barriers that individuals face in achieving occupational balance, such as overcommitment, misaligned priorities, external pressures, and lack of effective time management. The impact of these barriers on occupational performance, roles, and lifestyles is examined, highlighting issues like reduced efficiency, strained relationships, and potential health problems. Strategies for cultivating occupational balance are a key focus. These strategies include practical methods like time blocking, prioritizing tasks, establishing self-care rituals, decluttering, connecting with nature, and engaging in reflective practices. These approaches are designed to be adaptable and applicable to a wide range of life scenarios, promoting a proactive and mindful approach to daily living. The overall aim is to equip participants with the knowledge and tools to create a balanced lifestyle that supports their mental, emotional, and physical health, thereby improving their functional performance in daily life.   A:  The patient demonstrated a high level of engagement and active participation throughout the session on occupational balance. The patient frequently contributed to discussions, offering insightful reflections on personal experiences related to the barriers and strategies for achieving occupational balance. There was a clear understanding of the concept and an ability to relate it to their own life. The patient showed enthusiasm in learning and applying the strategies discussed, such as time blocking and self-care rituals, indicating a strong motivation to improve their occupational balance. The patient's  proactive approach and responsiveness to the topic suggest a high potential for implementing these strategies effectively in their daily routine.   P: Continue to attend PHP OT group sessions 5x week for 4 weeks to promote daily structure, social engagement, and opportunities to develop and utilize adaptive strategies to maximize functional performance in preparation for safe transition and integration back into school, work, and the community. Plan to address topic of TBD in next OT group session.                   OT Education - 03/13/23 0933     Education Details Occupational Balance 3              OT Short Term Goals - 03/12/23 1025       OT SHORT TERM GOAL #1   Title Patient will be educated on strategies to improve psychosocial skills needed to participate fully in all daily, work, and leisure activities.    Time 4    Period Weeks    Status On-going    Target Date 04/11/23      OT SHORT TERM GOAL #2   Title Pt will apply psychosocial skills and coping mechanisms to daily activities in order to function independently and reintegrate into  community dwelling    Status On-going      OT SHORT TERM GOAL #3   Title Pt will choose and/or engage in 1-3 socially engaging leisure activities to improve social participation skills upon reintegrating into community    Status On-going                      Plan - 03/13/23 0933     Psychosocial Skills Habits;Routines and Behaviors;Interpersonal Interaction;Coping Strategies             Patient will benefit from skilled therapeutic intervention in order to improve the following deficits and impairments:       Psychosocial Skills: Habits, Routines and Behaviors, Interpersonal Interaction, Coping Strategies   Visit Diagnosis: Difficulty coping    Problem List Patient Active Problem List   Diagnosis Date Noted   Schizoaffective disorder (HCC) 03/01/2023   Poorly controlled mild persistent  asthma 09/05/2021   Syncope 08/02/2021   Tobacco abuse 08/02/2021   Vaginal candidiasis 08/02/2021   CAP (community acquired pneumonia) 07/18/2021   OSA (obstructive sleep apnea) 07/18/2021   Hypokalemia 07/18/2021   Depression 03/24/2020   Bilateral low back pain with left-sided sciatica 03/24/2020   Left lumbar radiculopathy 02/25/2020   Absence of bladder continence 02/25/2020   History of postmenopausal bleeding 04/29/2019   Polypharmacy 04/03/2019   Emphysema, unspecified (HCC) 05/17/2018   Weakness 01/25/2017   Chronic migraine w/o aura w/o status migrainosus, not intractable 12/22/2014   Encounter for other general counseling or advice on contraception 09/02/2014   Bipolar disorder (HCC) 10/12/2011   Anxiety 10/12/2011   GERD (gastroesophageal reflux disease) 10/12/2011    Ted Mcalpine, OT 03/13/2023, 9:34 AM Kerrin Champagne, OT  [facility] HOSPITALIZATION PROGRAM [address] [place], [place], [address] Phone: [phone]   Fax:  [phone]  Name: TRINH SANJOSE MRN: [id number] Date of Birth: [birth year]

## 2023-03-14 ENCOUNTER — Other Ambulatory Visit (HOSPITAL_COMMUNITY): Payer: 59 | Attending: Psychiatry | Admitting: Licensed Clinical Social Worker

## 2023-03-14 ENCOUNTER — Encounter (HOSPITAL_COMMUNITY): Payer: Self-pay

## 2023-03-14 ENCOUNTER — Other Ambulatory Visit (HOSPITAL_COMMUNITY): Payer: 59

## 2023-03-14 DIAGNOSIS — F259 Schizoaffective disorder, unspecified: Secondary | ICD-10-CM | POA: Insufficient documentation

## 2023-03-14 DIAGNOSIS — R4589 Other symptoms and signs involving emotional state: Secondary | ICD-10-CM

## 2023-03-14 DIAGNOSIS — F333 Major depressive disorder, recurrent, severe with psychotic symptoms: Secondary | ICD-10-CM | POA: Diagnosis not present

## 2023-03-14 NOTE — Therapy (Signed)
[facility] PARTIAL HOSPITALIZATION PROGRAM [address] [place], [place], [address] Phone: [phone]   Fax:  [phone]  Occupational Therapy Treatment Virtual Visit via Video Note  I connected with Lauren Chapman on 03/14/23 at  8:00 AM EST by a video enabled telemedicine application and verified that I am speaking with the correct person using two identifiers.  Location: Patient: home Provider: office   I discussed the limitations of evaluation and management by telemedicine and the availability of in person appointments. The patient expressed understanding and agreed to proceed.    The patient was advised to call back or seek an in-person evaluation if the symptoms worsen or if the condition fails to improve as anticipated.  I provided 55 minutes of non-face-to-face time during this encounter.   Patient Details  Name: Lauren Chapman MRN: [id number] Date of Birth: 09/18/1970 No data recorded  Encounter Date: 03/12/2023   OT End of Session - 03/14/23 1006     Visit Number 4    Number of Visits 20    Date for OT Re-Evaluation 04/11/23    OT Start Time 1200    OT Stop Time 1255    OT Time Calculation (min) 55 min    Activity Tolerance Patient tolerated treatment well    Behavior During Therapy WFL for tasks assessed/performed             Past Medical History:  Diagnosis Date   Allergy    Anxiety    Bipolar affective psychosis (HCC)    Complication of anesthesia    " Acting silly- waving at everyone"-Giddy   Depression    Emphysema lung (HCC)    inhaler prn,  followed by pcp and pulmonologist-- dr [name]   Family history of adverse reaction to anesthesia    Father had rheumatic fever, and died on operating table.   GERD (gastroesophageal reflux disease)    Kidney cysts 01/24/2020   Right Kidney   Migraine    Moderate persistent asthma 09/05/2021   Smokers' cough (HCC)    per pt not productive   SUI (stress urinary  incontinence, female)    Tubular adenoma of colon 07/08/2020   Wears glasses     Past Surgical History:  Procedure Laterality Date   ABDOMINAL HYSTERECTOMY N/A    Phreesia 11/30/2019   CARPAL TUNNEL RELEASE Right 09-12-2007   @[facility]    and GANGLION CYST EXCISION   COLONOSCOPY     CYSTOSCOPY N/A 04/29/2019   Procedure: CYSTOSCOPY;  Surgeon: [name], MD;  Location: [facility];  Service: Gynecology;  Laterality: N/A;   DILATATION & CURETTAGE/HYSTEROSCOPY WITH MYOSURE N/A 11/13/2018   Procedure: DILATATION & CURETTAGE/HYSTEROSCOPY WITH MYOSURE;  Surgeon: [name], MD;  Location: [facility];  Service: Gynecology;  Laterality: N/A;  request 9:00am OR start time in [place] Gyn block requests one hour   DILATION AND CURETTAGE OF UTERUS  02/2019   EYE SURGERY N/A    Phreesia 11/30/2019   GANGLION CYST EXCISION     R wrist   interstem      implant for overactive bladder   KNEE ARTHROSCOPY WITH ANTERIOR CRUCIATE LIGAMENT (ACL) REPAIR Right 10/2015   LAPAROSCOPIC CHOLECYSTECTOMY  1997   LAPAROSCOPIC HYSTERECTOMY Bilateral 04/29/2019   Procedure: HYSTERECTOMY TOTAL LAPAROSCOPIC, BILATERAL SALPINO-OOPHORECTOMY;  Surgeon: [name], MD;  Location: [facility];  Service: Gynecology;  Laterality: Bilateral;   LAPAROSCOPIC TOTAL HYSTERECTOMY     TOOTH EXTRACTION  04/01/2019  tooth removal Bilateral 2022   4 teeth removed   UPPER GASTROINTESTINAL ENDOSCOPY      There were no vitals filed for this visit.   Subjective Assessment - 03/14/23 1006     Currently in Pain? No/denies    Pain Score 0-No pain                Group Session:  S: Doing a little bit better today I think.   O: The primary objective of this topic is to explore and understand the concept of occupational balance in the context of daily living. The term "occupational balance" is defined broadly, encompassing all activities that occupy an  individual's time and energy, including self-care, leisure, and work-related tasks. The goal is to guide participants towards achieving a harmonious blend of these activities, tailored to their personal values and life circumstances. This balance is aimed at enhancing overall well-being, not by equally distributing time across activities, but by ensuring that daily engagements are fulfilling and not draining. The content delves into identifying various barriers that individuals face in achieving occupational balance, such as overcommitment, misaligned priorities, external pressures, and lack of effective time management. The impact of these barriers on occupational performance, roles, and lifestyles is examined, highlighting issues like reduced efficiency, strained relationships, and potential health problems. Strategies for cultivating occupational balance are a key focus. These strategies include practical methods like time blocking, prioritizing tasks, establishing self-care rituals, decluttering, connecting with nature, and engaging in reflective practices. These approaches are designed to be adaptable and applicable to a wide range of life scenarios, promoting a proactive and mindful approach to daily living. The overall aim is to equip participants with the knowledge and tools to create a balanced lifestyle that supports their mental, emotional, and physical health, thereby improving their functional performance in daily life.   A:  The patient demonstrated a high level of engagement and active participation throughout the session on occupational balance. The patient frequently contributed to discussions, offering insightful reflections on personal experiences related to the barriers and strategies for achieving occupational balance. There was a clear understanding of the concept and an ability to relate it to their own life. The patient showed enthusiasm in learning and applying the strategies  discussed, such as time blocking and self-care rituals, indicating a strong motivation to improve their occupational balance. The patient's proactive approach and responsiveness to the topic suggest a high potential for implementing these strategies effectively in their daily routine.    P: Continue to attend PHP OT group sessions 5x week for 4 weeks to promote daily structure, social engagement, and opportunities to develop and utilize adaptive strategies to maximize functional performance in preparation for safe transition and integration back into school, work, and the community. Plan to address topic of Routines in next OT group session.                   OT Education - 03/14/23 1006     Education Details Occupational Balance 4    Methods Explanation;Handout    Comprehension Verbalized understanding              OT Short Term Goals - 03/12/23 1025       OT SHORT TERM GOAL #1   Title Patient will be educated on strategies to improve psychosocial skills needed to participate fully in all daily, work, and leisure activities.    Time 4    Period Weeks    Status On-going    Target  Date 04/11/23      OT SHORT TERM GOAL #2   Title Pt will apply psychosocial skills and coping mechanisms to daily activities in order to function independently and reintegrate into community dwelling    Status On-going      OT SHORT TERM GOAL #3   Title Pt will choose and/or engage in 1-3 socially engaging leisure activities to improve social participation skills upon reintegrating into community    Status On-going                      Plan - 03/14/23 1006     Psychosocial Skills Habits;Routines and Behaviors;Interpersonal Interaction;Coping Strategies             Patient will benefit from skilled therapeutic intervention in order to improve the following deficits and impairments:       Psychosocial Skills: Habits, Routines and Behaviors, Interpersonal  Interaction, Coping Strategies   Visit Diagnosis: Difficulty coping    Problem List Patient Active Problem List   Diagnosis Date Noted   Schizoaffective disorder (HCC) 03/01/2023   Poorly controlled mild persistent asthma 09/05/2021   Syncope 08/02/2021   Tobacco abuse 08/02/2021   Vaginal candidiasis 08/02/2021   CAP (community acquired pneumonia) 07/18/2021   OSA (obstructive sleep apnea) 07/18/2021   Hypokalemia 07/18/2021   Depression 03/24/2020   Bilateral low back pain with left-sided sciatica 03/24/2020   Left lumbar radiculopathy 02/25/2020   Absence of bladder continence 02/25/2020   History of postmenopausal bleeding 04/29/2019   Polypharmacy 04/03/2019   Emphysema, unspecified (HCC) 05/17/2018   Weakness 01/25/2017   Chronic migraine w/o aura w/o status migrainosus, not intractable 12/22/2014   Encounter for other general counseling or advice on contraception 09/02/2014   Bipolar disorder (HCC) 10/12/2011   Anxiety 10/12/2011   GERD (gastroesophageal reflux disease) 10/12/2011    Ted Mcalpine, OT 03/14/2023, 10:07 AM  Kerrin Champagne, OT   [facility] HOSPITALIZATION PROGRAM [address] [place], [place], [address] Phone: [phone]   Fax:  [phone]  Name: Lauren Chapman MRN: [id number] Date of Birth: [birth year]

## 2023-03-15 ENCOUNTER — Other Ambulatory Visit (HOSPITAL_COMMUNITY): Payer: 59 | Attending: Psychiatry | Admitting: Licensed Clinical Social Worker

## 2023-03-15 ENCOUNTER — Other Ambulatory Visit (HOSPITAL_COMMUNITY): Payer: 59

## 2023-03-15 DIAGNOSIS — F333 Major depressive disorder, recurrent, severe with psychotic symptoms: Secondary | ICD-10-CM | POA: Diagnosis not present

## 2023-03-15 DIAGNOSIS — F259 Schizoaffective disorder, unspecified: Secondary | ICD-10-CM | POA: Insufficient documentation

## 2023-03-15 DIAGNOSIS — R4589 Other symptoms and signs involving emotional state: Secondary | ICD-10-CM

## 2023-03-16 ENCOUNTER — Encounter (HOSPITAL_COMMUNITY): Payer: Self-pay

## 2023-03-16 ENCOUNTER — Other Ambulatory Visit (HOSPITAL_COMMUNITY): Payer: 59

## 2023-03-16 ENCOUNTER — Other Ambulatory Visit (HOSPITAL_COMMUNITY): Payer: 59 | Attending: Psychiatry | Admitting: Licensed Clinical Social Worker

## 2023-03-16 DIAGNOSIS — F419 Anxiety disorder, unspecified: Secondary | ICD-10-CM | POA: Insufficient documentation

## 2023-03-16 DIAGNOSIS — F250 Schizoaffective disorder, bipolar type: Secondary | ICD-10-CM | POA: Insufficient documentation

## 2023-03-16 DIAGNOSIS — R4589 Other symptoms and signs involving emotional state: Secondary | ICD-10-CM

## 2023-03-16 DIAGNOSIS — Z658 Other specified problems related to psychosocial circumstances: Secondary | ICD-10-CM | POA: Insufficient documentation

## 2023-03-16 DIAGNOSIS — F333 Major depressive disorder, recurrent, severe with psychotic symptoms: Secondary | ICD-10-CM

## 2023-03-16 DIAGNOSIS — Z5900 Homelessness unspecified: Secondary | ICD-10-CM | POA: Insufficient documentation

## 2023-03-16 DIAGNOSIS — F259 Schizoaffective disorder, unspecified: Secondary | ICD-10-CM

## 2023-03-16 NOTE — Progress Notes (Signed)
 [facility] MD/PA/NP OP Progress Note  03/16/2023 10:43 AM Lauren Chapman  MRN:  [id number]  Chief Complaint: Increased depression  HPI: Lauren Chapman 53 year old female attending partial hospitalization programming.  She carries a diagnosis related to schizoaffective disorder bipolar type.  Presenting depressed, tearful, flat and guarded.  Reports multiple psychosocial stressors that is attributing to her worsening depressive symptoms today.  States she was recently advised by her daughter that her daughter is relocating to [place] with her family.  Reports she currently resides with her daughter and her husband in a apartment.  She reports concerns of not being able to see her grandchildren coupled with financial strain. " I am on a fixed income"  Reports concerns related to being homeless in 1 to 2 months.  Discussed options related to downsizing to a 1 bedroom apartment and/or finding a roommate She appeared receptive to plan.    Lauren Chapman reports she did not take any of her psychotropic medications last night however, did resume earlier this morning.  Rating her depression and anxiety 8 out of 10 with 10 being the worst.  She provided verbal authorization to follow-up with both of her daughters.  This provider attempted to contact Lauren Chapman at [phone] and Lauren Chapman at [phone].  Multiple attempts.  Patient does contract for safety during this evaluation.  Patient does report some protective factors related to completing or attempting suicide. "  I promised my children I would never do that" reports a good relationship with her grandchildren as well.  Discussed following up with [phone]/911.  She appeared receptive to plan.  Staff to continue to monitor for safety.   Visit Diagnosis:    ICD-10-CM   1. Severe episode of recurrent major depressive disorder, with psychotic features (HCC)  F33.3       Past Psychiatric History:   Past Medical History:  Past Medical History:  Diagnosis Date   Allergy     Anxiety    Bipolar affective psychosis (HCC)    Complication of anesthesia    " Acting silly- waving at everyone"-Giddy   Depression    Emphysema lung (HCC)    inhaler prn,  followed by pcp and pulmonologist-- dr [name]   Family history of adverse reaction to anesthesia    Father had rheumatic fever, and died on operating table.   GERD (gastroesophageal reflux disease)    Kidney cysts 01/24/2020   Right Kidney   Migraine    Moderate persistent asthma 09/05/2021   Smokers' cough (HCC)    per pt not productive   SUI (stress urinary incontinence, female)    Tubular adenoma of colon 07/08/2020   Wears glasses     Past Surgical History:  Procedure Laterality Date   ABDOMINAL HYSTERECTOMY N/A    Phreesia 11/30/2019   CARPAL TUNNEL RELEASE Right 09-12-2007   @[facility]    and GANGLION CYST EXCISION   COLONOSCOPY     CYSTOSCOPY N/A 04/29/2019   Procedure: CYSTOSCOPY;  Surgeon: [name], MD;  Location: [facility];  Service: Gynecology;  Laterality: N/A;   DILATATION & CURETTAGE/HYSTEROSCOPY WITH MYOSURE N/A 11/13/2018   Procedure: DILATATION & CURETTAGE/HYSTEROSCOPY WITH MYOSURE;  Surgeon: [name], MD;  Location: [facility];  Service: Gynecology;  Laterality: N/A;  request 9:00am OR start time in [place] Gyn block requests one hour   DILATION AND CURETTAGE OF UTERUS  02/2019   EYE SURGERY N/A    Phreesia 11/30/2019   GANGLION CYST EXCISION     R  wrist   interstem      implant for overactive bladder   KNEE ARTHROSCOPY WITH ANTERIOR CRUCIATE LIGAMENT (ACL) REPAIR Right 10/2015   LAPAROSCOPIC CHOLECYSTECTOMY  1997   LAPAROSCOPIC HYSTERECTOMY Bilateral 04/29/2019   Procedure: HYSTERECTOMY TOTAL LAPAROSCOPIC, BILATERAL SALPINO-OOPHORECTOMY;  Surgeon: [name], MD;  Location: [facility];  Service: Gynecology;  Laterality: Bilateral;   LAPAROSCOPIC TOTAL HYSTERECTOMY     TOOTH EXTRACTION  04/01/2019   tooth  removal Bilateral 2022   4 teeth removed   UPPER GASTROINTESTINAL ENDOSCOPY      Family Psychiatric History:   Family History:  Family History  Problem Relation Age of Onset   Depression Mother    Cancer Mother        Cervical   Alcohol abuse Father    Crohn's disease Sister    Stroke Brother 57   Migraines Daughter    GER disease Daughter    Depression Daughter    Bipolar disorder Daughter    Migraines Daughter    GER disease Daughter    Colon cancer Neg Hx    Rectal cancer Neg Hx    Stomach cancer Neg Hx     Social History:  Social History   Socioeconomic History   Marital status: Divorced    Spouse name: n/a   Number of children: 2   Years of education: 18   Highest education level: Master's degree (e.g., MA, MS, MEng, MEd, MSW, MBA)  Occupational History   Occupation: Disabled    Comment: Formerly a Runner, broadcasting/film/video.  Tobacco Use   Smoking status: Former    Current packs/day: 0.00    Average packs/day: 1 pack/day for 39.8 years (39.8 ttl pk-yrs)    Types: Cigarettes    Start date: 10/10/1981    Quit date: 07/18/2021    Years since quitting: 1.6    Passive exposure: Never   Smokeless tobacco: Never  Vaping Use   Vaping status: Never Used  Substance and Sexual Activity   Alcohol use: Never    Alcohol/week: 0.0 standard drinks of alcohol   Drug use: Never   Sexual activity: Not Currently    Partners: Male    Birth control/protection: Post-menopausal    Comment: 1st intercourse 53 yo-More than 5 partners  Other Topics Concern   Not on file  Social History Narrative   Lives at home with 1 of her two daughters   Right-handed.   2-4 cups caffeine daily.   Disabled    One story home   Social Drivers of Health   Financial Resource Strain: Low Risk  (01/09/2023)   Overall Financial Resource Strain (CARDIA)    Difficulty of Paying Living Expenses: Not very hard  Food Insecurity: Food Insecurity Present (03/02/2023)   Hunger Vital Sign    Worried About Running  Out of Food in the Last Year: Sometimes true    Ran Out of Food in the Last Year: Sometimes true  Transportation Needs: No Transportation Needs (03/02/2023)   PRAPARE - Administrator, Civil Service (Medical): No    Lack of Transportation (Non-Medical): No  Physical Activity: Inactive (01/09/2023)   Exercise Vital Sign    Days of Exercise per Week: 0 days    Minutes of Exercise per Session: 0 min  Stress: No Stress Concern Present (02/15/2023)   Received from [facility] - Occupational Stress Questionnaire    Feeling of Stress : Only a little  Recent Concern: Stress -  Stress Concern Present (02/05/2023)   Received from [facility] of Occupational Health - Occupational Stress Questionnaire    Feeling of Stress : To some extent  Social Connections: Socially Isolated (01/09/2023)   Social Connection and Isolation Panel [NHANES]    Frequency of Communication with Friends and Family: Never    Frequency of Social Gatherings with Friends and Family: Never    Attends Religious Services: Never    Database administrator or Organizations: No    Attends Banker Meetings: Never    Marital Status: Divorced    Allergies:  Allergies  Allergen Reactions   Codeine Anaphylaxis and Other (See Comments)    Cannot have ANYTHING with codeine    Metabolic Disorder Labs: Lab Results  Component Value Date   HGBA1C 5.8 05/10/2022   No results found for: "PROLACTIN" Lab Results  Component Value Date   CHOL 153 11/07/2021   TRIG 134.0 11/07/2021   HDL 52.50 11/07/2021   CHOLHDL 3 11/07/2021   VLDL 26.8 11/07/2021   LDLCALC 73 11/07/2021   LDLCALC 99 05/27/2021   Lab Results  Component Value Date   TSH 1.33 07/07/2022   TSH 0.86 09/09/2018    Therapeutic Level Labs: No results found for: "LITHIUM" No results found for: "VALPROATE" No results found for: "CBMZ"  Current Medications: Current  Outpatient Medications  Medication Sig Dispense Refill   albuterol (VENTOLIN HFA) 108 (90 Base) MCG/ACT inhaler Inhale 2 puffs into the lungs every 4 (four) hours as needed for wheezing or shortness of breath. 1 each 1   ALPRAZolam (XANAX) 1 MG tablet Take 1 mg by mouth 2 (two) times daily as needed. (Patient not taking: Reported on 03/07/2023)     amoxicillin (AMOXIL) 500 MG capsule Take 500 mg by mouth every 8 (eight) hours. (Patient not taking: Reported on 03/07/2023)     ARIPiprazole (ABILIFY) 30 MG tablet Take 30 mg by mouth at bedtime. (Patient not taking: Reported on 03/02/2023)     benztropine (COGENTIN) 0.5 MG tablet Take by mouth. (Patient not taking: Reported on 03/07/2023)     budesonide-formoterol (SYMBICORT) 160-4.5 MCG/ACT inhaler Inhale 2 puffs into the lungs 2 (two) times daily. 1 each 6   buPROPion (WELLBUTRIN XL) 150 MG 24 hr tablet Take 150 mg by mouth every morning. (Patient not taking: Reported on 03/07/2023)     busPIRone (BUSPAR) 15 MG tablet Take 1 tablet (15 mg total) by mouth 3 (three) times daily. 30 tablet 0   citalopram (CELEXA) 20 MG tablet Take 20 mg by mouth daily.     diclofenac (VOLTAREN) 75 MG EC tablet Take 1 tablet (75 mg total) by mouth 2 (two) times daily. (Patient not taking: Reported on 03/07/2023) 50 tablet 2   EPINEPHrine 0.3 mg/0.3 mL IJ SOAJ injection Inject 0.3 mg into the muscle as needed for anaphylaxis. 1 each 0   esomeprazole (NEXIUM) 20 MG capsule Take 1 capsule (20 mg total) by mouth 2 (two) times daily before a meal. 60 capsule 1   gabapentin (NEURONTIN) 400 MG capsule Take 1 capsule (400 mg total) by mouth 2 (two) times daily. 20 capsule 0   ibuprofen (ADVIL) 800 MG tablet Take 800 mg by mouth every 8 (eight) hours as needed.     lamoTRIgine (LAMICTAL) 200 MG tablet Take 200 mg by mouth 2 (two) times daily. (Patient not taking: Reported on 03/02/2023)     MYRBETRIQ 50 MG TB24 tablet Take 50 mg by mouth daily.  ondansetron (ZOFRAN) 4 MG tablet Take 1  tablet (4 mg total) by mouth every 8 (eight) hours as needed for nausea or vomiting. 20 tablet 6   pantoprazole (PROTONIX) 40 MG tablet Take by mouth. (Patient not taking: Reported on 03/07/2023)     predniSONE (DELTASONE) 10 MG tablet 12 day tapering dose (Patient not taking: Reported on 03/07/2023) 48 tablet 0   Rimegepant Sulfate (NURTEC) 75 MG TBDP Take 1 tablet (75 mg total) by mouth daily as needed. 8 tablet 11   SUMAtriptan (IMITREX) 6 MG/0.5ML SOLN injection Inject 0.5 mLs (6 mg total) into the skin See admin instructions. INJECT 6 MG (0.5 ml) INTO THE SKIN AS NEEDED FOR MIGRAINE OR HEADACHE. MAY REPEAT ONCE IN 2 HOURS IF HEADACHE PERSISTS OR RECURS. MAX 2 DOSES IN 24 HOURS. 3 mL 5   thiothixene (NAVANE) 5 MG capsule Take 5 mg by mouth 3 (three) times daily.     tiZANidine (ZANAFLEX) 4 MG tablet Take 1 tablet (4 mg total) by mouth every 6 (six) hours as needed for muscle spasms. 30 tablet 5   traMADol (ULTRAM) 50 MG tablet Take 50 mg by mouth every 6 (six) hours as needed. (Patient not taking: Reported on 03/07/2023)     No current facility-administered medications for this visit.     Musculoskeletal: Virtual platform Psychiatric Specialty Exam: Review of Systems  Psychiatric/Behavioral:  Negative for decreased concentration. Suicidal ideas: passive ideaitons.The patient is nervous/anxious.   All other systems reviewed and are negative.   Last menstrual period 05/02/2019.There is no height or weight on file to calculate BMI.  General Appearance: Casual  Eye Contact:  Good  Speech:  Clear and Coherent  Volume:  Normal  Mood:  Depressed  Affect:  Congruent  Thought Process:  Coherent  Orientation:  Full (Time, Place, and Person)  Thought Content: Logical   Suicidal Thoughts:  No reported passive thoughts with a plan to overdose on last night currently denying plan or intent  Homicidal Thoughts:  No  Memory:  Immediate;   Good  Judgement:  Good  Insight:  Good  Psychomotor  Activity:  Normal  Concentration:  Concentration: Good  Recall:  Good  Fund of Knowledge: Good  Language: Good  Akathisia:  No  Handed:  Right  AIMS (if indicated): not done  Assets:  Communication Skills Physical Health Resilience Social Support  ADL's:  Intact  Cognition: WNL  Sleep:  Good   Screenings: GAD-7    Flowsheet Row Office Visit from 03/02/2023 in [facility] Video Visit from 03/01/2023 in BEHAVIORAL HEALTH PARTIAL HOSPITALIZATION PROGRAM Office Visit from 09/11/2022 in [facility] [facility] Visit from 08/18/2022 in [facility] Visit from 07/07/2022 in [facility]  Total GAD-7 Score 15 16 14 21 20       PHQ2-9    Flowsheet Row Counselor from 03/07/2023 in BEHAVIORAL HEALTH PARTIAL HOSPITALIZATION PROGRAM Office Visit from 03/02/2023 in [facility] [facility] Video Visit from 03/01/2023 in BEHAVIORAL HEALTH PARTIAL HOSPITALIZATION PROGRAM Clinical Support from 01/09/2023 in [facility] [place] [facility] Visit from 09/11/2022 in [facility] Total Score 6 5 6 5 3   PHQ-9 Total Score 16 13 18 12 16       Flowsheet Row Counselor from 03/07/2023 in BEHAVIORAL HEALTH PARTIAL HOSPITALIZATION PROGRAM Video Visit from 03/01/2023 in BEHAVIORAL HEALTH  PARTIAL HOSPITALIZATION PROGRAM ED from 01/18/2023 in [facility] Emergency Department at [facility]  C-SSRS RISK CATEGORY High Risk High Risk No Risk        Assessment and Plan:  Continue partial hospitalization programming Continue medications as directed  Safety planning will attempt to follow-up with patient's daughters for additional support/collateral.  Collaboration of Care: Collaboration of Care: Other continue medications as  directed  Patient/Guardian was advised Release of Information must be obtained prior to any record release in order to collaborate their care with an outside provider. Patient/Guardian was advised if they have not already done so to contact the registration department to sign all necessary forms in order for [place] to release information regarding their care.   Consent: Patient/Guardian gives verbal consent for treatment and assignment of benefits for services provided during this visit. Patient/Guardian expressed understanding and agreed to proceed.    [name], NP 03/16/2023, 10:43 AM

## 2023-03-16 NOTE — Progress Notes (Signed)
 Met with patient through virtual connection today as she presented with sad affect and depressed mood.  Patient was crying some and admitted she had just learned her oldest daughter, who had moved back her from [place] was going to be returning there in a month or so with her husband and two children, ages 47 weeks and 55 years old.  Patient stated she could not afford to keep a place by herself and was just not ready for them to move back to [place] but that her son-in-law was having a hard time keeping a job as a [employer] architect here and wanted to return to his [place] with his family as he has a house there and a job waiting. Patient admitted to some positive thoughts of suicide but stated "I would not do that to my youngest daughter and grand-babies".  Patient reported she was just having thoughts to get in her car and to drive away as she just does not know what to do and stated she feels like a "burden" at times.  Patient again denied any active suicidal ideations, plans, intent, or means to want to harm self at this time and denied need to be evaluated for possible inpatient admission.  Patient rated her current level of depression and anxiety both at a 10 on a scale of 0-10 with 10 being the worst based on her just finding out about her oldest daughter's plans to return to [place] with her children and worried about placement later.  Patient stated she could not return to live with her parents and did not want to infringe on her 22 year old daughter and her boyfriend.  Patient discussed her history with attempts to harm self, but denied plans, intent or means currently again.  Patient agreed with Hillery Jacks, NP plans to attempt to reach out to her daughters to make sure they are aware of patient's current depression and symptoms as patient agreed if she started having any active thoughts to want to harm self or others, with any plans, intent or means then she would tell her children and come to the [facility] urgent care  for an immediate evaluation.  Patient stated understanding and agreed with any needed crisis planning.  Stated she did not want to return to the hospital and wanted to continue with PHP as she does feel the skills and what she is learning is helpful.  Patient stated plans to remain on medications as prescribed and to let [facility] staff know if any issues with medications or worsening symptoms.  Spoke with Hillery Jacks, NP who had already met with patient for crisis planning and she will continue to attempt to contact pt's daughters so they are aware as patient reported her oldest daughter had taken her young children for a pediatrician appointment today as to why she was not currently at home.

## 2023-03-16 NOTE — Therapy (Signed)
[facility] PARTIAL HOSPITALIZATION PROGRAM [address] [place], [place], [address] Phone: [phone]   Fax:  [phone]  Occupational Therapy Treatment  Virtual Visit via Video Note  I connected with Lauren Chapman on 03/16/23 at  8:00 AM EST by a video enabled telemedicine application and verified that I am speaking with the correct person using two identifiers.  Location: Patient: home Provider: office   I discussed the limitations of evaluation and management by telemedicine and the availability of in person appointments. The patient expressed understanding and agreed to proceed.    The patient was advised to call back or seek an in-person evaluation if the symptoms worsen or if the condition fails to improve as anticipated.  I provided 55 minutes of non-face-to-face time during this encounter.   Patient Details  Name: Lauren Chapman MRN: [id number] Date of Birth: 10-20-1970 No data recorded  Encounter Date: 03/13/2023   OT End of Session - 03/16/23 0949     Visit Number 5    Number of Visits 20    Date for OT Re-Evaluation 04/11/23    OT Start Time 1200    OT Stop Time 1255    OT Time Calculation (min) 55 min             Past Medical History:  Diagnosis Date   Allergy    Anxiety    Bipolar affective psychosis (HCC)    Complication of anesthesia    " Acting silly- waving at everyone"-Giddy   Depression    Emphysema lung (HCC)    inhaler prn,  followed by pcp and pulmonologist-- dr [name]   Family history of adverse reaction to anesthesia    Father had rheumatic fever, and died on operating table.   GERD (gastroesophageal reflux disease)    Kidney cysts 01/24/2020   Right Kidney   Migraine    Moderate persistent asthma 09/05/2021   Smokers' cough (HCC)    per pt not productive   SUI (stress urinary incontinence, female)    Tubular adenoma of colon 07/08/2020   Wears glasses     Past Surgical History:  Procedure  Laterality Date   ABDOMINAL HYSTERECTOMY N/A    [employer] 11/30/2019   CARPAL TUNNEL RELEASE Right 09-12-2007   @[facility]    and GANGLION CYST EXCISION   COLONOSCOPY     CYSTOSCOPY N/A 04/29/2019   Procedure: CYSTOSCOPY;  Surgeon: [name], MD;  Location: [facility];  Service: Gynecology;  Laterality: N/A;   DILATATION & CURETTAGE/HYSTEROSCOPY WITH MYOSURE N/A 11/13/2018   Procedure: DILATATION & CURETTAGE/HYSTEROSCOPY WITH MYOSURE;  Surgeon: [name], MD;  Location: [facility];  Service: Gynecology;  Laterality: N/A;  request 9:00am OR start time in [place] [facility] block requests one hour   DILATION AND CURETTAGE OF UTERUS  02/2019   EYE SURGERY N/A    [employer]reesia 11/30/2019   GANGLION CYST EXCISION     R wrist   interstem      implant for overactive bladder   KNEE ARTHROSCOPY WITH ANTERIOR CRUCIATE LIGAMENT (ACL) REPAIR Right 10/2015   LAPAROSCOPIC CHOLECYSTECTOMY  1997   LAPAROSCOPIC HYSTERECTOMY Bilateral 04/29/2019   Procedure: HYSTERECTOMY TOTAL LAPAROSCOPIC, BILATERAL SALPINO-OOPHORECTOMY;  Surgeon: [name], MD;  Location: [facility];  Service: Gynecology;  Laterality: Bilateral;   LAPAROSCOPIC TOTAL HYSTERECTOMY     TOOTH EXTRACTION  04/01/2019   tooth removal Bilateral 2022   4 teeth removed   UPPER GASTROINTESTINAL ENDOSCOPY  There were no vitals filed for this visit.   Subjective Assessment - 03/16/23 0949     Currently in Pain? No/denies    Pain Score 0-No pain               Group Session:  S: Doing okay.   O: The objective of the telehealth group therapy session was to discuss the significance of routines in promoting mental health and wellbeing. The OT aimed to explore the power of routines in providing structure, stability, and predictability in individuals' lives, as well as their role in establishing healthy habits, reducing stress and anxiety, managing time effectively, achieving  goals, and fostering a sense of community and social connectedness.   Participants were guided to identify areas where routines could improve their mental health and were provided with tips for establishing and maintaining healthy routines. The session concluded by emphasizing the potential of routines to enhance overall quality of life.  Homework Assignment:  As part of the session, participants were assigned a homework task to reflect on their current routines and select one area of their lives where they could establish a new routine to improve their mental health and wellbeing. They were instructed to start small and implement the routine gradually, while seeking support from friends, family, or mental health professionals as needed. The participants were asked to report their progress in the next therapy session, focusing on the benefits and challenges encountered during the implementation of their chosen routine.   A: During the telehealth group therapy session, the patient actively participated in the discussion on the importance of routines in promoting mental health and wellbeing. The patient demonstrated a good understanding of the power of routines in providing structure, stability, and predictability in their life. They were able to identify areas in their life where routines could contribute to improving their overall mental health.    P: Continue to attend PHP OT group sessions 5x week for 4 weeks to promote daily structure, social engagement, and opportunities to develop and utilize adaptive strategies to maximize functional performance in preparation for safe transition and integration back into school, work, and the community. Plan to address topic of pt 2 in next OT group session.                    OT Education - 03/16/23 0949     Education Details Routines 1              OT Short Term Goals - 03/12/23 1025       OT SHORT TERM GOAL #1   Title Patient  will be educated on strategies to improve psychosocial skills needed to participate fully in all daily, work, and leisure activities.    Time 4    Period Weeks    Status On-going    Target Date 04/11/23      OT SHORT TERM GOAL #2   Title Pt will apply psychosocial skills and coping mechanisms to daily activities in order to function independently and reintegrate into community dwelling    Status On-going      OT SHORT TERM GOAL #3   Title Pt will choose and/or engage in 1-3 socially engaging leisure activities to improve social participation skills upon reintegrating into community    Status On-going                      Plan - 03/16/23 0950     Psychosocial Skills Habits;Routines and Behaviors;Interpersonal Interaction;Coping  Strategies             Patient will benefit from skilled therapeutic intervention in order to improve the following deficits and impairments:       Psychosocial Skills: Habits, Routines and Behaviors, Interpersonal Interaction, Coping Strategies   Visit Diagnosis: Difficulty coping    Problem List Patient Active Problem List   Diagnosis Date Noted   Schizoaffective disorder (HCC) 03/01/2023   Poorly controlled mild persistent asthma 09/05/2021   Syncope 08/02/2021   Tobacco abuse 08/02/2021   Vaginal candidiasis 08/02/2021   CAP (community acquired pneumonia) 07/18/2021   OSA (obstructive sleep apnea) 07/18/2021   Hypokalemia 07/18/2021   Depression 03/24/2020   Bilateral low back pain with left-sided sciatica 03/24/2020   Left lumbar radiculopathy 02/25/2020   Absence of bladder continence 02/25/2020   History of postmenopausal bleeding 04/29/2019   Polypharmacy 04/03/2019   Emphysema, unspecified (HCC) 05/17/2018   Weakness 01/25/2017   Chronic migraine w/o aura w/o status migrainosus, not intractable 12/22/2014   Encounter for other general counseling or advice on contraception 09/02/2014   Bipolar disorder (HCC)  10/12/2011   Anxiety 10/12/2011   GERD (gastroesophageal reflux disease) 10/12/2011    Ted Mcalpine, OT 03/16/2023, 9:50 AM  Kerrin Champagne, OT   [facility] HOSPITALIZATION PROGRAM [address] [place], [place], [address] Phone: [phone]   Fax:  [phone]  Name: Lauren Chapman MRN: [id number] Date of Birth: 1970-05-28

## 2023-03-19 ENCOUNTER — Other Ambulatory Visit (HOSPITAL_COMMUNITY): Payer: 59

## 2023-03-19 ENCOUNTER — Other Ambulatory Visit (HOSPITAL_COMMUNITY): Payer: 59 | Admitting: Licensed Clinical Social Worker

## 2023-03-19 DIAGNOSIS — R4589 Other symptoms and signs involving emotional state: Secondary | ICD-10-CM

## 2023-03-19 DIAGNOSIS — F333 Major depressive disorder, recurrent, severe with psychotic symptoms: Secondary | ICD-10-CM

## 2023-03-19 DIAGNOSIS — F259 Schizoaffective disorder, unspecified: Secondary | ICD-10-CM

## 2023-03-19 LAB — HM MAMMOGRAPHY

## 2023-03-19 NOTE — Progress Notes (Cosign Needed Addendum)
 BH MD/PA/NP OP Progress Note  03/19/2023 2:01 PM Lauren Chapman  MRN:  [id number]  Chief Complaint: Increased depression  HPI: Lauren Chapman 53 year old female attending partial hospitalization programming.  She carries a diagnosis related to schizoaffective disorder bipolar type.  Presenting depressed, tearful, flat and guarded.  Reports multiple psychosocial stressors that is attributing to her worsening depressive symptoms today.  States she was recently advised by her daughter that her daughter is relocating to [place] with her family.  Reports she currently resides with her daughter and her husband in a apartment.  She reports concerns of not being able to see her grandchildren coupled with financial strain. " I am on a fixed income"  Reports concerns related to being homeless in 1 to 2 months.  Discussed options related to downsizing to a 1 bedroom apartment and/or finding a roommate She appeared receptive to plan.    Interval History:  Patient presented for follow-up in regards to medication management while in partial hospitalization program.  She also had severe intrusive SI last Friday but it has since been improving. She had been advised to go to be evaluated for possible inpatient psychiatric admission but patient declined this and felt it was unnecessary given she felt she was not going to attempt anything.  She reports passive SI but denies HI/VH.  She reports AH that had been sporadically commanding in nature telling her to harm herself via overdose but now has gone back to Lauren Chapman of someone saying her name.  She would like to defer any psychotropic adjustments for when she sees her outpatient psychiatrist at [facility] which she states is on Wednesday. She is able to contract for safety and has family regularly checking in on her. We discussed her medication regimen and discussed how her carbamazepine may interact with any other medications she is taking.   She verbalized understanding and will be discussing this with her outpatient psychiatrist.  She also was amenable to discussing starting an antipsychotic with her outpatient psychiatrist.  Risk Assessment: A suicide and violence risk assessment was performed as part of this evaluation. There patient is deemed to be at chronic elevated risk for self-harm/suicide given the following factors: suicidal ideation or threats without a plan and feelings of hopelessness. These risk factors are mitigated by the following factors: no history of violence, motivation for treatment, utilization of positive coping skills, supportive family, sense of responsibility to family and social supports, presence of a significant relationship, presence of an available support system, expresses purpose for living, effective problem solving skills, support system in agreement with treatment recommendations, and presence of a safety plan with follow-up care. The patient is deemed to be at chronic elevated risk for violence given the following factors: N/A. These risk factors are mitigated by the following factors: no known history of violence towards others, no known history of threats of harm towards others, and no command hallucinations to harm others in the last 6 months. There is no acute risk for suicide or violence at this time. The patient was educated about relevant modifiable risk factors including following recommendations for treatment of psychiatric illness and abstaining from substance abuse.   While future psychiatric events cannot be accurately predicted, the patient does not currently require  acute inpatient psychiatric care and does not currently meet [facility] involuntary commitment criteria.     Visit Diagnosis:  No diagnosis found.   Past Psychiatric History:   Past Medical History:  Past Medical  History:  Diagnosis Date   Allergy    Anxiety    Bipolar affective psychosis (HCC)    Complication of  anesthesia    " Acting silly- waving at everyone"-Giddy   Depression    Emphysema lung (HCC)    inhaler prn,  followed by pcp and pulmonologist-- dr [name]   Family history of adverse reaction to anesthesia    Father had rheumatic fever, and died on operating table.   GERD (gastroesophageal reflux disease)    Kidney cysts 01/24/2020   Right Kidney   Migraine    Moderate persistent asthma 09/05/2021   Smokers' cough (HCC)    per pt not productive   SUI (stress urinary incontinence, female)    Tubular adenoma of colon 07/08/2020   Wears glasses     Past Surgical History:  Procedure Laterality Date   ABDOMINAL HYSTERECTOMY N/A    Phreesia 11/30/2019   CARPAL TUNNEL RELEASE Right 09-12-2007   @[facility]    and GANGLION CYST EXCISION   COLONOSCOPY     CYSTOSCOPY N/A 04/29/2019   Procedure: CYSTOSCOPY;  Surgeon: [name], MD;  Location[facility];  Service: Gynecology;  Laterality: N/A;   DILATATION & CURETTAGE/HYSTEROSCOPY WITH MYOSURE N/A 11/13/2018   Procedure: DILATATION & CURETTAGE/HYSTEROSCOPY WITH MYOSURE;  Surgeon: [name], MD;  Location: [facility];  Service: Gynecology;  Laterality: N/A;  request 9:00am OR start time in [place] [facility] block requests one hour   DILATION AND CURETTAGE OF UTERUS  02/2019   EYE SURGERY N/A    Phreesia 11/30/2019   GANGLION CYST EXCISION     R wrist   interstem      implant for overactive bladder   KNEE ARTHROSCOPY WITH ANTERIOR CRUCIATE LIGAMENT (ACL) REPAIR Right 10/2015   LAPAROSCOPIC CHOLECYSTECTOMY  1997   LAPAROSCOPIC HYSTERECTOMY Bilateral 04/29/2019   Procedure: HYSTERECTOMY TOTAL LAPAROSCOPIC, BILATERAL SALPINO-OOPHORECTOMY;  Surgeon: [name], MD;  Location: [facility];  Service: Gynecology;  Laterality: Bilateral;   LAPAROSCOPIC TOTAL HYSTERECTOMY     TOOTH EXTRACTION  04/01/2019   tooth removal Bilateral 2022   4 teeth removed   UPPER GASTROINTESTINAL  ENDOSCOPY      Family Psychiatric History:   Family History:  Family History  Problem Relation Age of Onset   Depression Mother    Cancer Mother        Cervical   Alcohol abuse Father    Crohn's disease Sister    Stroke Brother 31   Migraines Daughter    GER disease Daughter    Depression Daughter    Bipolar disorder Daughter    Migraines Daughter    GER disease Daughter    Colon cancer Neg Hx    Rectal cancer Neg Hx    Stomach cancer Neg Hx     Social History:  Social History   Socioeconomic History   Marital status: Divorced    Spouse name: n/a   Number of children: 2   Years of education: 18   Highest education level: Master's degree (e.g., MA, MS, MEng, MEd, MSW, MBA)  Occupational History   Occupation: Disabled    Comment: Formerly a Runner, broadcasting/film/video.  Tobacco Use   Smoking status: Former    Current packs/day: 0.00    Average packs/day: 1 pack/day for 39.8 years (39.8 ttl pk-yrs)    Types: Cigarettes    Start date: 10/10/1981    Quit date: 07/18/2021    Years since quitting: 1.6    Passive exposure: Never  Smokeless tobacco: Never  Vaping Use   Vaping status: Never Used  Substance and Sexual Activity   Alcohol use: Never    Alcohol/week: 0.0 standard drinks of alcohol   Drug use: Never   Sexual activity: Not Currently    Partners: Male    Birth control/protection: Post-menopausal    Comment: 1st intercourse 53 yo-More than 5 partners  Other Topics Concern   Not on file  Social History Narrative   Lives at home with 1 of her two daughters   Right-handed.   2-4 cups caffeine daily.   Disabled    One story home   Social Drivers of Health   Financial Resource Strain: Low Risk  (01/09/2023)   Overall Financial Resource Strain (CARDIA)    Difficulty of Paying Living Expenses: Not very hard  Food Insecurity: Food Insecurity Present (03/02/2023)   Hunger Vital Sign    Worried About Running Out of Food in the Last Year: Sometimes true    Ran Out of Food in  the Last Year: Sometimes true  Transportation Needs: No Transportation Needs (03/02/2023)   PRAPARE - Administrator, Civil Service (Medical): No    Lack of Transportation (Non-Medical): No  Physical Activity: Inactive (01/09/2023)   Exercise Vital Sign    Days of Exercise per Week: 0 days    Minutes of Exercise per Session: 0 min  Stress: No Stress Concern Present (02/15/2023)   Received from [facility] - Occupational Stress Questionnaire    Feeling of Stress : Only a little  Recent Concern: Stress - Stress Concern Present (02/05/2023)   Received from [facility] of [facility]ational Health - Occupational Stress Questionnaire    Feeling of Stress : To some extent  Social Connections: Socially Isolated (01/09/2023)   Social Connection and Isolation Panel [NHANES]    Frequency of Communication with Friends and Family: Never    Frequency of Social Gatherings with Friends and Family: Never    Attends Religious Services: Never    Database administrator or Organizations: No    Attends Banker Meetings: Never    Marital Status: Divorced    Allergies:  Allergies  Allergen Reactions   Codeine Anaphylaxis and Other (See Comments)    Cannot have ANYTHING with codeine    Metabolic Disorder Labs: Lab Results  Component Value Date   HGBA1C 5.8 05/10/2022   No results found for: "PROLACTIN" Lab Results  Component Value Date   CHOL 153 11/07/2021   TRIG 134.0 11/07/2021   HDL 52.50 11/07/2021   CHOLHDL 3 11/07/2021   VLDL 26.8 11/07/2021   LDLCALC 73 11/07/2021   LDLCALC 99 05/27/2021   Lab Results  Component Value Date   TSH 1.33 07/07/2022   TSH 0.86 09/09/2018    Therapeutic Level Labs: No results found for: "LITHIUM" No results found for: "VALPROATE" No results found for: "CBMZ"  Current Medications: Current Outpatient Medications  Medication Sig Dispense Refill   carbamazepine  (TEGRETOL XR) 100 MG 12 hr tablet Take 100 mg by mouth 2 (two) times daily.     albuterol (VENTOLIN HFA) 108 (90 Base) MCG/ACT inhaler Inhale 2 puffs into the lungs every 4 (four) hours as needed for wheezing or shortness of breath. 1 each 1   budesonide-formoterol (SYMBICORT) 160-4.5 MCG/ACT inhaler Inhale 2 puffs into the lungs 2 (two) times daily. 1 each 6   busPIRone (BUSPAR) 15 MG tablet Take 1 tablet (  15 mg total) by mouth 3 (three) times daily. 30 tablet 0   citalopram (CELEXA) 20 MG tablet Take 20 mg by mouth daily.     EPINEPHrine 0.3 mg/0.3 mL IJ SOAJ injection Inject 0.3 mg into the muscle as needed for anaphylaxis. 1 each 0   esomeprazole (NEXIUM) 20 MG capsule Take 1 capsule (20 mg total) by mouth 2 (two) times daily before a meal. 60 capsule 1   gabapentin (NEURONTIN) 400 MG capsule Take 1 capsule (400 mg total) by mouth 2 (two) times daily. 20 capsule 0   ibuprofen (ADVIL) 800 MG tablet Take 800 mg by mouth every 8 (eight) hours as needed.     MYRBETRIQ 50 MG TB24 tablet Take 50 mg by mouth daily.     ondansetron (ZOFRAN) 4 MG tablet Take 1 tablet (4 mg total) by mouth every 8 (eight) hours as needed for nausea or vomiting. 20 tablet 6   Rimegepant Sulfate (NURTEC) 75 MG TBDP Take 1 tablet (75 mg total) by mouth daily as needed. 8 tablet 11   SUMAtriptan (IMITREX) 6 MG/0.5ML SOLN injection Inject 0.5 mLs (6 mg total) into the skin See admin instructions. INJECT 6 MG (0.5 ml) INTO THE SKIN AS NEEDED FOR MIGRAINE OR HEADACHE. MAY REPEAT ONCE IN 2 HOURS IF HEADACHE PERSISTS OR RECURS. MAX 2 DOSES IN 24 HOURS. 3 mL 5   thiothixene (NAVANE) 5 MG capsule Take 5 mg by mouth 3 (three) times daily.     tiZANidine (ZANAFLEX) 4 MG tablet Take 1 tablet (4 mg total) by mouth every 6 (six) hours as needed for muscle spasms. 30 tablet 5   traMADol (ULTRAM) 50 MG tablet Take 50 mg by mouth every 6 (six) hours as needed. (Patient not taking: Reported on 03/07/2023)     No current facility-administered  medications for this visit.     Musculoskeletal: Virtual platform Psychiatric Specialty Exam: Review of Systems  Psychiatric/Behavioral:  Negative for decreased concentration. Suicidal ideas: passive ideaitons.The patient is nervous/anxious.   All other systems reviewed and are negative.   Last menstrual period 05/02/2019.There is no height or weight on file to calculate BMI.  General Appearance: Casual  Eye Contact:  Good  Speech:  Clear and Coherent  Volume:  Normal  Mood:  Depressed  Affect:  Congruent  Thought Process:  Coherent  Orientation:  Full (Time, Place, and Person)  Thought Content: Logical   Suicidal Thoughts:  No reported passive thoughts with a plan to overdose on last night currently denying plan or intent  Homicidal Thoughts:  No  Memory:  Immediate;   Good  Judgement:  Good  Insight:  Good  Psychomotor Activity:  Normal  Concentration:  Concentration: Good  Recall:  Good  Fund of Knowledge: Good  Language: Good  Akathisia:  No  Handed:  Right  AIMS (if indicated): not done  Assets:  Communication Skills Physical Health Resilience Social Support  ADL's:  Intact  Cognition: WNL  Sleep:  Good   Screenings: GAD-7    Flowsheet Row Office Visit from 03/02/2023 in [facility] Video Visit from 03/01/2023 in BEHAVIORAL HEALTH PARTIAL HOSPITALIZATION PROGRAM Office Visit from 09/11/2022 in [facility] Visit from 08/18/2022 in [facility] Visit from 07/07/2022 in [facility] at [employer]  Total GAD-7 Score 15 16 14 21 20       Insurance account manager from  03/07/2023 in BEHAVIORAL HEALTH PARTIAL HOSPITALIZATION PROGRAM Office Visit from 03/02/2023 in [facility] Video Visit from 03/01/2023 in BEHAVIORAL HEALTH PARTIAL HOSPITALIZATION PROGRAM Clinical  Support from 01/09/2023 in [facility] Visit from 09/11/2022 in [facility] [place] [facility] Total Score 6 5 6 5 3   PHQ-9 Total Score 16 13 18 12 16       Flowsheet Row Counselor from 03/07/2023 in BEHAVIORAL HEALTH PARTIAL HOSPITALIZATION PROGRAM Video Visit from 03/01/2023 in BEHAVIORAL HEALTH PARTIAL HOSPITALIZATION PROGRAM ED from 01/18/2023 in [facility] Department at [facility]  C-SSRS RISK CATEGORY High Risk High Risk No Risk        Assessment and Plan:  NEVAYA NAGELE is a 53 year old female with history of migraines, emphysema, OSA, GERD, bipolar disorder versus schizoaffective disorder presenting to for hospitalization program as a stepdown from a 4-day hospitalization stay due to [facility] with plan.  She was psychiatrically hospitalized at [facility] from 02/15/2023 to 02/19/2023 and then again at [facility] 02/23/2023 02/27/23.  Her SI has been passive today and yesterday.  She is not at this time needing inpatient psychiatric hospitalization criteria but will be closely monitored. She reports AH that appears to be mood-congruent and could be secondary to negative self-talk or severe depression.  Differential for patient's diagnosis of schizoaffective disorder versus major depressive disorder, severe with psychotic features.  She may benefit from interventional treatments such as TMS or ECT if she continues to have difficulty with severe depressive symptoms.  Continue partial hospitalization programming Gabapentin 400 mg twice daily Citalopram 20 mg daily Tegretol 100 mg bid Buspirone 15 mg 3 times daily  Collaboration of Care: Collaboration of Care: Other continue medications as directed  Patient/Guardian was advised Release of Information must be obtained prior to any record release in order to collaborate their care with an outside provider. Patient/Guardian was advised if they have not  already done so to contact the registration department to sign all necessary forms in order for [place] to release information regarding their care.   Consent: Patient/Guardian gives verbal consent for treatment and assignment of benefits for services provided during this visit. Patient/Guardian expressed understanding and agreed to proceed.    Park Pope, MD 03/19/2023, 2:01 PM

## 2023-03-20 ENCOUNTER — Encounter (HOSPITAL_COMMUNITY): Payer: Self-pay

## 2023-03-20 ENCOUNTER — Other Ambulatory Visit (HOSPITAL_COMMUNITY): Payer: 59 | Attending: Psychiatry | Admitting: Licensed Clinical Social Worker

## 2023-03-20 ENCOUNTER — Encounter: Payer: Self-pay | Admitting: Family Medicine

## 2023-03-20 ENCOUNTER — Other Ambulatory Visit (HOSPITAL_COMMUNITY): Payer: 59

## 2023-03-20 DIAGNOSIS — R4589 Other symptoms and signs involving emotional state: Secondary | ICD-10-CM

## 2023-03-20 DIAGNOSIS — F259 Schizoaffective disorder, unspecified: Secondary | ICD-10-CM | POA: Insufficient documentation

## 2023-03-20 DIAGNOSIS — F333 Major depressive disorder, recurrent, severe with psychotic symptoms: Secondary | ICD-10-CM | POA: Diagnosis not present

## 2023-03-20 NOTE — Therapy (Signed)
 [facility] PARTIAL HOSPITALIZATION PROGRAM [address] [place], [place], [address] Phone: [phone]   Fax:  [phone]  Occupational Therapy Treatment Virtual Visit via Video Note  I connected with [name] on 03/20/23 at  8:00 AM EST by a video enabled telemedicine application and verified that I am speaking with the correct person using two identifiers.  Location: Patient: home Provider: office   I discussed the limitations of evaluation and management by telemedicine and the availability of in person appointments. The patient expressed understanding and agreed to proceed.    The patient was advised to call back or seek an in-person evaluation if the symptoms worsen or if the condition fails to improve as anticipated.  I provided 55 minutes of non-face-to-face time during this encounter.   Patient Details  Name: Lauren Chapman MRN: [id number] Date of Birth: 11/29/1970 No data recorded  Encounter Date: 03/14/2023   OT End of Session - 03/20/23 1011     Visit Number 6    Number of Visits 20    Date for OT Re-Evaluation 04/11/23    OT Start Time 1200    OT Stop Time 1255    OT Time Calculation (min) 55 min             Past Medical History:  Diagnosis Date   Allergy    Anxiety    Bipolar affective psychosis (HCC)    Complication of anesthesia    " Acting silly- waving at everyone"-Giddy   Depression    Emphysema lung (HCC)    inhaler prn,  followed by pcp and pulmonologist-- dr [name]   Family history of adverse reaction to anesthesia    Father had rheumatic fever, and died on operating table.   GERD (gastroesophageal reflux disease)    Kidney cysts 01/24/2020   Right Kidney   Migraine    Moderate persistent asthma 09/05/2021   Smokers' cough (HCC)    per pt not productive   SUI (stress urinary incontinence, female)    Tubular adenoma of colon 07/08/2020   Wears glasses     Past Surgical History:  Procedure  Laterality Date   ABDOMINAL HYSTERECTOMY N/A    Phreesia 11/30/2019   CARPAL TUNNEL RELEASE Right 09-12-2007   @[facility]    and GANGLION CYST EXCISION   COLONOSCOPY     CYSTOSCOPY N/A 04/29/2019   Procedure: CYSTOSCOPY;  Surgeon: [name], MD;  Location: [facility];  Service: Gynecology;  Laterality: N/A;   DILATATION & CURETTAGE/HYSTEROSCOPY WITH MYOSURE N/A 11/13/2018   Procedure: DILATATION & CURETTAGE/HYSTEROSCOPY WITH MYOSURE;  Surgeon: [name], MD;  Location: [facility];  Service: Gynecology;  Laterality: N/A;  request 9:00am OR start time in [place] Gyn block requests one hour   DILATION AND CURETTAGE OF UTERUS  02/2019   EYE SURGERY N/A    Phreesia 11/30/2019   GANGLION CYST EXCISION     R wrist   interstem      implant for overactive bladder   KNEE ARTHROSCOPY WITH ANTERIOR CRUCIATE LIGAMENT (ACL) REPAIR Right 10/2015   LAPAROSCOPIC CHOLECYSTECTOMY  1997   LAPAROSCOPIC HYSTERECTOMY Bilateral 04/29/2019   Procedure: HYSTERECTOMY TOTAL LAPAROSCOPIC, BILATERAL SALPINO-OOPHORECTOMY;  Surgeon: [name], MD;  Location: [facility];  Service: Gynecology;  Laterality: Bilateral;   LAPAROSCOPIC TOTAL HYSTERECTOMY     TOOTH EXTRACTION  04/01/2019   tooth removal Bilateral 2022   4 teeth removed   UPPER GASTROINTESTINAL ENDOSCOPY  There were no vitals filed for this visit.   Subjective Assessment - 03/20/23 1011     Currently in Pain? No/denies    Pain Score 0-No pain                Group Session:  S: Doing a little better today  O: The objective of the telehealth group therapy session was to discuss the significance of routines in promoting mental health and wellbeing. The OT aimed to explore the power of routines in providing structure, stability, and predictability in individuals' lives, as well as their role in establishing healthy habits, reducing stress and anxiety, managing time  effectively, achieving goals, and fostering a sense of community and social connectedness.   Participants were guided to identify areas where routines could improve their mental health and were provided with tips for establishing and maintaining healthy routines. The session concluded by emphasizing the potential of routines to enhance overall quality of life.  Homework Assignment:  As part of the session, participants were assigned a homework task to reflect on their current routines and select one area of their lives where they could establish a new routine to improve their mental health and wellbeing. They were instructed to start small and implement the routine gradually, while seeking support from friends, family, or mental health professionals as needed. The participants were asked to report their progress in the next therapy session, focusing on the benefits and challenges encountered during the implementation of their chosen routine.   A: The patient showed motivation and willingness to reflect on their current habits and behaviors, recognizing the need for positive changes. They actively engaged in the session, sharing personal experiences and challenges related to establishing and maintaining healthy routines. The patient expressed a desire to reduce stress and anxiety, manage their time more effectively, and achieve their goals through the implementation of routines.   P: Continue to attend PHP OT group sessions 5x week for 4 weeks to promote daily structure, social engagement, and opportunities to develop and utilize adaptive strategies to maximize functional performance in preparation for safe transition and integration back into school, work, and the community. Plan to address topic of TBD in next OT group session.                   OT Education - 03/20/23 1011     Education Details Routines 2              OT Short Term Goals - 03/12/23 1025       OT SHORT TERM  GOAL #1   Title Patient will be educated on strategies to improve psychosocial skills needed to participate fully in all daily, work, and leisure activities.    Time 4    Period Weeks    Status On-going    Target Date 04/11/23      OT SHORT TERM GOAL #2   Title Pt will apply psychosocial skills and coping mechanisms to daily activities in order to function independently and reintegrate into community dwelling    Status On-going      OT SHORT TERM GOAL #3   Title Pt will choose and/or engage in 1-3 socially engaging leisure activities to improve social participation skills upon reintegrating into community    Status On-going                      Plan - 03/20/23 1012     Psychosocial Skills Habits;Routines and Behaviors;Interpersonal Interaction;Coping Strategies  Patient will benefit from skilled therapeutic intervention in order to improve the following deficits and impairments:       Psychosocial Skills: Habits, Routines and Behaviors, Interpersonal Interaction, Coping Strategies   Visit Diagnosis: Difficulty coping    Problem List Patient Active Problem List   Diagnosis Date Noted   Schizoaffective disorder (HCC) 03/01/2023   Poorly controlled mild persistent asthma 09/05/2021   Syncope 08/02/2021   Tobacco abuse 08/02/2021   Vaginal candidiasis 08/02/2021   CAP (community acquired pneumonia) 07/18/2021   OSA (obstructive sleep apnea) 07/18/2021   Hypokalemia 07/18/2021   Depression 03/24/2020   Bilateral low back pain with left-sided sciatica 03/24/2020   Left lumbar radiculopathy 02/25/2020   Absence of bladder continence 02/25/2020   History of postmenopausal bleeding 04/29/2019   Polypharmacy 04/03/2019   Emphysema, unspecified (HCC) 05/17/2018   Weakness 01/25/2017   Chronic migraine w/o aura w/o status migrainosus, not intractable 12/22/2014   Encounter for other general counseling or advice on contraception 09/02/2014    Bipolar disorder (HCC) 10/12/2011   Anxiety 10/12/2011   GERD (gastroesophageal reflux disease) 10/12/2011    Ted Mcalpine, OT 03/20/2023, 10:12 AM  Kerrin Champagne, OT   [facility] HOSPITALIZATION PROGRAM [address] [place], [place], [address] Phone: [phone]   Fax:  [phone]  Name: Lauren Chapman MRN: [id number] Date of Birth: [birth year]

## 2023-03-20 NOTE — Progress Notes (Signed)
 Spoke with patient via Teams video call, used 2 identifiers to correctly identify patient. States that groups are going well. She continues to fight depression and suicidal thoughts due to her daughters moving away. Passive SI with no plan or intent. She will be seeing her psychiatrist tomorrow and will discuss a medication to help with anxiety as well as auditory command hallucinations. Hears voices at times that tell her to hurt herself. She knows not to act on them. Also see's colors at random times. On scale 1-10 as 10 being worst she rates depression at 8 and anxiety at 10. Denies SI. Pleasant, cooperative and affect brightens when spoken to. Enjoys groups and denies any issues.

## 2023-03-20 NOTE — Therapy (Signed)
 [facility] PARTIAL HOSPITALIZATION PROGRAM [address] [place], [place], [address] Phone: [phone]   Fax:  [phone]  Occupational Therapy Treatment Virtual Visit via Video Note  I connected with Lauren Chapman on 03/20/23 at  8:00 AM EST by a video enabled telemedicine application and verified that I am speaking with the correct person using two identifiers.  Location: Patient: home Provider: office   I discussed the limitations of evaluation and management by telemedicine and the availability of in person appointments. The patient expressed understanding and agreed to proceed.    The patient was advised to call back or seek an in-person evaluation if the symptoms worsen or if the condition fails to improve as anticipated.  I provided 55 minutes of non-face-to-face time during this encounter.   Patient Details  Name: Lauren Chapman MRN: [id number] Date of Birth: 03-Jan-1971 No data recorded  Encounter Date: 03/15/2023   OT End of Session - 03/20/23 1151     Visit Number 7    Number of Visits 20    Date for OT Re-Evaluation 04/11/23    OT Start Time 1200    OT Stop Time 1255    OT Time Calculation (min) 55 min             Past Medical History:  Diagnosis Date   Allergy    Anxiety    Bipolar affective psychosis (HCC)    Complication of anesthesia    " Acting silly- waving at everyone"-Giddy   Depression    Emphysema lung (HCC)    inhaler prn,  followed by pcp and pulmonologist-- dr [name]   Family history of adverse reaction to anesthesia    Father had rheumatic fever, and died on operating table.   GERD (gastroesophageal reflux disease)    Kidney cysts 01/24/2020   Right Kidney   Migraine    Moderate persistent asthma 09/05/2021   Smokers' cough (HCC)    per pt not productive   SUI (stress urinary incontinence, female)    Tubular adenoma of colon 07/08/2020   Wears glasses     Past Surgical History:  Procedure  Laterality Date   ABDOMINAL HYSTERECTOMY N/A    Phreesia 11/30/2019   CARPAL TUNNEL RELEASE Right 09-12-2007   @[facility]    and GANGLION CYST EXCISION   COLONOSCOPY     CYSTOSCOPY N/A 04/29/2019   Procedure: CYSTOSCOPY;  Surgeon: [name], MD;  Location: [facility];  Service: Gynecology;  Laterality: N/A;   DILATATION & CURETTAGE/HYSTEROSCOPY WITH MYOSURE N/A 11/13/2018   Procedure: DILATATION & CURETTAGE/HYSTEROSCOPY WITH MYOSURE;  Surgeon: [name], MD;  Location: [facility];  Service: Gynecology;  Laterality: N/A;  request 9:00am OR start time in [place] Gyn block requests one hour   DILATION AND CURETTAGE OF UTERUS  02/2019   EYE SURGERY N/A    Phreesia 11/30/2019   GANGLION CYST EXCISION     R wrist   interstem      implant for overactive bladder   KNEE ARTHROSCOPY WITH ANTERIOR CRUCIATE LIGAMENT (ACL) REPAIR Right 10/2015   LAPAROSCOPIC CHOLECYSTECTOMY  1997   LAPAROSCOPIC HYSTERECTOMY Bilateral 04/29/2019   Procedure: HYSTERECTOMY TOTAL LAPAROSCOPIC, BILATERAL SALPINO-OOPHORECTOMY;  Surgeon: [name], MD;  Location: [facility];  Service: Gynecology;  Laterality: Bilateral;   LAPAROSCOPIC TOTAL HYSTERECTOMY     TOOTH EXTRACTION  04/01/2019   tooth removal Bilateral 2022   4 teeth removed   UPPER GASTROINTESTINAL ENDOSCOPY  There were no vitals filed for this visit.   Subjective Assessment - 03/20/23 1150     Currently in Pain? No/denies    Pain Score 0-No pain                    Group Session:  S: Doing a little better today I think.   O: The objective of the telehealth group therapy session was to discuss the significance of routines in promoting mental health and wellbeing. The OT aimed to explore the power of routines in providing structure, stability, and predictability in individuals' lives, as well as their role in establishing healthy habits, reducing stress and anxiety,  managing time effectively, achieving goals, and fostering a sense of community and social connectedness.   Participants were guided to identify areas where routines could improve their mental health and were provided with tips for establishing and maintaining healthy routines. The session concluded by emphasizing the potential of routines to enhance overall quality of life.  Homework Assignment:  As part of the session, participants were assigned a homework task to reflect on their current routines and select one area of their lives where they could establish a new routine to improve their mental health and wellbeing. They were instructed to start small and implement the routine gradually, while seeking support from friends, family, or mental health professionals as needed. The participants were asked to report their progress in the next therapy session, focusing on the benefits and challenges encountered during the implementation of their chosen routine.   A:The patient actively participated in the discussion of tips for establishing and maintaining healthy routines. They demonstrated an understanding of the importance of starting small, being consistent, and gradually increasing the complexity of their routines. The patient expressed a willingness to remain flexible and adaptable, acknowledging that adjustments might be necessary as circumstances and priorities change over time.    P: Continue to attend PHP OT group sessions 5x week for 4 weeks to promote daily structure, social engagement, and opportunities to develop and utilize adaptive strategies to maximize functional performance in preparation for safe transition and integration back into school, work, and the community. Plan to address topic of TBD in next OT group session.               OT Education - 03/20/23 1150     Education Details Routines 3    Person(s) Educated Patient    Methods Explanation;Handout    Comprehension  Verbalized understanding              OT Short Term Goals - 03/12/23 1025       OT SHORT TERM GOAL #1   Title Patient will be educated on strategies to improve psychosocial skills needed to participate fully in all daily, work, and leisure activities.    Time 4    Period Weeks    Status On-going    Target Date 04/11/23      OT SHORT TERM GOAL #2   Title Pt will apply psychosocial skills and coping mechanisms to daily activities in order to function independently and reintegrate into community dwelling    Status On-going      OT SHORT TERM GOAL #3   Title Pt will choose and/or engage in 1-3 socially engaging leisure activities to improve social participation skills upon reintegrating into community    Status On-going  Plan - 03/20/23 1151     Psychosocial Skills Habits;Routines and Behaviors;Interpersonal Interaction;Coping Strategies             Patient will benefit from skilled therapeutic intervention in order to improve the following deficits and impairments:       Psychosocial Skills: Habits, Routines and Behaviors, Interpersonal Interaction, Coping Strategies   Visit Diagnosis: Difficulty coping    Problem List Patient Active Problem List   Diagnosis Date Noted   Schizoaffective disorder (HCC) 03/01/2023   Poorly controlled mild persistent asthma 09/05/2021   Syncope 08/02/2021   Tobacco abuse 08/02/2021   Vaginal candidiasis 08/02/2021   CAP (community acquired pneumonia) 07/18/2021   OSA (obstructive sleep apnea) 07/18/2021   Hypokalemia 07/18/2021   Depression 03/24/2020   Bilateral low back pain with left-sided sciatica 03/24/2020   Left lumbar radiculopathy 02/25/2020   Absence of bladder continence 02/25/2020   History of postmenopausal bleeding 04/29/2019   Polypharmacy 04/03/2019   Emphysema, unspecified (HCC) 05/17/2018   Weakness 01/25/2017   Chronic migraine w/o aura w/o status migrainosus, not  intractable 12/22/2014   Encounter for other general counseling or advice on contraception 09/02/2014   Bipolar disorder (HCC) 10/12/2011   Anxiety 10/12/2011   GERD (gastroesophageal reflux disease) 10/12/2011    Ted Mcalpine, OT 03/20/2023, 11:51 AM  Kerrin Champagne, OT   [facility] HOSPITALIZATION PROGRAM [address] [place], [place], [address] Phone: [phone]   Fax:  [phone]  Name: Lauren Chapman MRN: [id number] Date of Birth: 1970/03/22

## 2023-03-21 ENCOUNTER — Other Ambulatory Visit (HOSPITAL_COMMUNITY): Payer: 59

## 2023-03-22 ENCOUNTER — Other Ambulatory Visit (HOSPITAL_COMMUNITY): Payer: 59

## 2023-03-22 ENCOUNTER — Encounter (HOSPITAL_COMMUNITY): Payer: Self-pay

## 2023-03-22 ENCOUNTER — Other Ambulatory Visit (HOSPITAL_COMMUNITY): Payer: 59 | Admitting: Licensed Clinical Social Worker

## 2023-03-22 ENCOUNTER — Ambulatory Visit: Payer: 59 | Admitting: Neurology

## 2023-03-22 DIAGNOSIS — F333 Major depressive disorder, recurrent, severe with psychotic symptoms: Secondary | ICD-10-CM | POA: Diagnosis not present

## 2023-03-22 DIAGNOSIS — R4589 Other symptoms and signs involving emotional state: Secondary | ICD-10-CM

## 2023-03-22 DIAGNOSIS — F259 Schizoaffective disorder, unspecified: Secondary | ICD-10-CM | POA: Insufficient documentation

## 2023-03-22 NOTE — Therapy (Signed)
 [facility] PARTIAL HOSPITALIZATION PROGRAM [address] [place], [place], [address] Phone: [phone]   Fax:  [phone]  Occupational Therapy Treatment Virtual Visit via Video Note  I connected with [name] on 03/22/23 at  8:00 AM EST by a video enabled telemedicine application and verified that I am speaking with the correct person using two identifiers.  Location: Patient: home Provider: office   I discussed the limitations of evaluation and management by telemedicine and the availability of in person appointments. The patient expressed understanding and agreed to proceed.    The patient was advised to call back or seek an in-person evaluation if the symptoms worsen or if the condition fails to improve as anticipated.  I provided 55 minutes of non-face-to-face time during this encounter.   Patient Details  Name: Lauren Chapman MRN: [id number] Date of Birth: 03-Dec-1970 No data recorded  Encounter Date: 03/16/2023   OT End of Session - 03/22/23 0913     Visit Number 8    Number of Visits 20    Date for OT Re-Evaluation 04/11/23    OT Start Time 1200    OT Stop Time 1255    OT Time Calculation (min) 55 min             Past Medical History:  Diagnosis Date   Allergy    Anxiety    Bipolar affective psychosis (HCC)    Complication of anesthesia    " Acting silly- waving at everyone"-Giddy   Depression    Emphysema lung (HCC)    inhaler prn,  followed by pcp and pulmonologist-- dr [name]   Family history of adverse reaction to anesthesia    Father had rheumatic fever, and died on operating table.   GERD (gastroesophageal reflux disease)    Kidney cysts 01/24/2020   Right Kidney   Migraine    Moderate persistent asthma 09/05/2021   Smokers' cough (HCC)    per pt not productive   SUI (stress urinary incontinence, female)    Tubular adenoma of colon 07/08/2020   Wears glasses     Past Surgical History:  Procedure  Laterality Date   ABDOMINAL HYSTERECTOMY N/A    Phreesia 11/30/2019   CARPAL TUNNEL RELEASE Right 09-12-2007   @[facility]    and GANGLION CYST EXCISION   COLONOSCOPY     CYSTOSCOPY N/A 04/29/2019   Procedure: CYSTOSCOPY;  Surgeon: [name], MD;  Location: [facility];  Service: Gynecology;  Laterality: N/A;   DILATATION & CURETTAGE/HYSTEROSCOPY WITH MYOSURE N/A 11/13/2018   Procedure: DILATATION & CURETTAGE/HYSTEROSCOPY WITH MYOSURE;  Surgeon: [name], MD;  Location: [facility];  Service: Gynecology;  Laterality: N/A;  request 9:00am OR start time in [place] Gyn block requests one hour   DILATION AND CURETTAGE OF UTERUS  02/2019   EYE SURGERY N/A    Phreesia 11/30/2019   GANGLION CYST EXCISION     R wrist   interstem      implant for overactive bladder   KNEE ARTHROSCOPY WITH ANTERIOR CRUCIATE LIGAMENT (ACL) REPAIR Right 10/2015   LAPAROSCOPIC CHOLECYSTECTOMY  1997   LAPAROSCOPIC HYSTERECTOMY Bilateral 04/29/2019   Procedure: HYSTERECTOMY TOTAL LAPAROSCOPIC, BILATERAL SALPINO-OOPHORECTOMY;  Surgeon: [name], MD;  Location: [facility];  Service: Gynecology;  Laterality: Bilateral;   LAPAROSCOPIC TOTAL HYSTERECTOMY     TOOTH EXTRACTION  04/01/2019   tooth removal Bilateral 2022   4 teeth removed   UPPER GASTROINTESTINAL ENDOSCOPY  There were no vitals filed for this visit.   Subjective Assessment - 03/22/23 0913     Currently in Pain? No/denies    Pain Score 0-No pain                   Group Session:  S: Doing okay today.   O: The objective of the telehealth group therapy session was to discuss the significance of routines in promoting mental health and wellbeing. The OT aimed to explore the power of routines in providing structure, stability, and predictability in individuals' lives, as well as their role in establishing healthy habits, reducing stress and anxiety, managing time  effectively, achieving goals, and fostering a sense of community and social connectedness.   Participants were guided to identify areas where routines could improve their mental health and were provided with tips for establishing and maintaining healthy routines. The session concluded by emphasizing the potential of routines to enhance overall quality of life.  Homework Assignment:  As part of the session, participants were assigned a homework task to reflect on their current routines and select one area of their lives where they could establish a new routine to improve their mental health and wellbeing. They were instructed to start small and implement the routine gradually, while seeking support from friends, family, or mental health professionals as needed. The participants were asked to report their progress in the next therapy session, focusing on the benefits and challenges encountered during the implementation of their chosen routine.   A: The patient's active participation, understanding of the concepts discussed, and their motivation to implement positive changes in their life, it can be assessed that they have a good potential to benefit from incorporating healthy routines into their daily life. The patient's commitment to reflecting on their current routines and selecting an area for improvement indicates a proactive approach to their mental health and wellbeing. Continued support and guidance in implementing and maintaining the identified routine will be beneficial for the patient's overall progress.   P: Continue to attend PHP OT group sessions 5x week for 4 weeks to promote daily structure, social engagement, and opportunities to develop and utilize adaptive strategies to maximize functional performance in preparation for safe transition and integration back into school, work, and the community. Plan to address topic of tbd in next OT group session.                OT Education  - 03/22/23 0913     Education Details Routines 4              OT Short Term Goals - 03/12/23 1025       OT SHORT TERM GOAL #1   Title Patient will be educated on strategies to improve psychosocial skills needed to participate fully in all daily, work, and leisure activities.    Time 4    Period Weeks    Status On-going    Target Date 04/11/23      OT SHORT TERM GOAL #2   Title Pt will apply psychosocial skills and coping mechanisms to daily activities in order to function independently and reintegrate into community dwelling    Status On-going      OT SHORT TERM GOAL #3   Title Pt will choose and/or engage in 1-3 socially engaging leisure activities to improve social participation skills upon reintegrating into community    Status On-going  Plan - 03/22/23 0913     Psychosocial Skills Habits;Routines and Behaviors;Interpersonal Interaction;Coping Strategies             Patient will benefit from skilled therapeutic intervention in order to improve the following deficits and impairments:       Psychosocial Skills: Habits, Routines and Behaviors, Interpersonal Interaction, Coping Strategies   Visit Diagnosis: Difficulty coping    Problem List Patient Active Problem List   Diagnosis Date Noted   Schizoaffective disorder (HCC) 03/01/2023   Poorly controlled mild persistent asthma 09/05/2021   Syncope 08/02/2021   Tobacco abuse 08/02/2021   Vaginal candidiasis 08/02/2021   CAP (community acquired pneumonia) 07/18/2021   OSA (obstructive sleep apnea) 07/18/2021   Hypokalemia 07/18/2021   Depression 03/24/2020   Bilateral low back pain with left-sided sciatica 03/24/2020   Left lumbar radiculopathy 02/25/2020   Absence of bladder continence 02/25/2020   History of postmenopausal bleeding 04/29/2019   Polypharmacy 04/03/2019   Emphysema, unspecified (HCC) 05/17/2018   Weakness 01/25/2017   Chronic migraine w/o aura w/o  status migrainosus, not intractable 12/22/2014   Encounter for other general counseling or advice on contraception 09/02/2014   Bipolar disorder (HCC) 10/12/2011   Anxiety 10/12/2011   GERD (gastroesophageal reflux disease) 10/12/2011    Ted Mcalpine, OT 03/22/2023, 9:14 AM  Kerrin Champagne, OT   [facility] HOSPITALIZATION PROGRAM [address] [place], [place], [address] Phone: [phone]   Fax:  [phone]  Name: KATHRENE SINOPOLI MRN: [id number] Date of Birth: Apr 29, 1970

## 2023-03-23 ENCOUNTER — Other Ambulatory Visit (HOSPITAL_COMMUNITY): Payer: 59

## 2023-03-23 ENCOUNTER — Encounter (HOSPITAL_COMMUNITY): Payer: Self-pay

## 2023-03-23 ENCOUNTER — Other Ambulatory Visit (HOSPITAL_COMMUNITY): Payer: 59 | Admitting: Licensed Clinical Social Worker

## 2023-03-23 DIAGNOSIS — F259 Schizoaffective disorder, unspecified: Secondary | ICD-10-CM | POA: Insufficient documentation

## 2023-03-23 DIAGNOSIS — R4589 Other symptoms and signs involving emotional state: Secondary | ICD-10-CM

## 2023-03-23 DIAGNOSIS — F333 Major depressive disorder, recurrent, severe with psychotic symptoms: Secondary | ICD-10-CM

## 2023-03-23 DIAGNOSIS — R45851 Suicidal ideations: Secondary | ICD-10-CM | POA: Insufficient documentation

## 2023-03-23 NOTE — Therapy (Signed)
 [facility] PARTIAL HOSPITALIZATION PROGRAM [address] [place], [place], [address] Phone: [phone]   Fax:  [phone]  Occupational Therapy Treatment Virtual Visit via Video Note  I connected with Lauren Chapman on 03/23/23 at  8:00 AM EST by a video enabled telemedicine application and verified that I am speaking with the correct person using two identifiers.  Location: Patient: home Provider: office   I discussed the limitations of evaluation and management by telemedicine and the availability of in person appointments. The patient expressed understanding and agreed to proceed.    The patient was advised to call back or seek an in-person evaluation if the symptoms worsen or if the condition fails to improve as anticipated.  I provided 55 minutes of non-face-to-face time during this encounter.   Patient Details  Name: Lauren Chapman MRN: [id number] Date of Birth: Jul 04, 1970 No data recorded  Encounter Date: 03/20/2023   OT End of Session - 03/23/23 0916     Visit Number 10    Number of Visits 20    Date for OT Re-Evaluation 04/11/23    OT Start Time 1200    OT Stop Time 1255    OT Time Calculation (min) 55 min             Past Medical History:  Diagnosis Date   Allergy    Anxiety    Bipolar affective psychosis (HCC)    Complication of anesthesia    " Acting silly- waving at everyone"-Giddy   Depression    Emphysema lung (HCC)    inhaler prn,  followed by pcp and pulmonologist-- dr [name]   Family history of adverse reaction to anesthesia    Father had rheumatic fever, and died on operating table.   GERD (gastroesophageal reflux disease)    Kidney cysts 01/24/2020   Right Kidney   Migraine    Moderate persistent asthma 09/05/2021   Smokers' cough (HCC)    per pt not productive   SUI (stress urinary incontinence, female)    Tubular adenoma of colon 07/08/2020   Wears glasses     Past Surgical History:  Procedure  Laterality Date   ABDOMINAL HYSTERECTOMY N/A    Phreesia 11/30/2019   CARPAL TUNNEL RELEASE Right 09-12-2007   @[facility]    and GANGLION CYST EXCISION   COLONOSCOPY     CYSTOSCOPY N/A 04/29/2019   Procedure: CYSTOSCOPY;  Surgeon: [name], MD;  Location: [facility];  Service: Gynecology;  Laterality: N/A;   DILATATION & CURETTAGE/HYSTEROSCOPY WITH MYOSURE N/A 11/13/2018   Procedure: DILATATION & CURETTAGE/HYSTEROSCOPY WITH MYOSURE;  Surgeon: [name], MD;  Location: [facility];  Service: Gynecology;  Laterality: N/A;  request 9:00am OR start time in [place] Gyn block requests one hour   DILATION AND CURETTAGE OF UTERUS  02/2019   EYE SURGERY N/A    Phreesia 11/30/2019   GANGLION CYST EXCISION     R wrist   interstem      implant for overactive bladder   KNEE ARTHROSCOPY WITH ANTERIOR CRUCIATE LIGAMENT (ACL) REPAIR Right 10/2015   LAPAROSCOPIC CHOLECYSTECTOMY  1997   LAPAROSCOPIC HYSTERECTOMY Bilateral 04/29/2019   Procedure: HYSTERECTOMY TOTAL LAPAROSCOPIC, BILATERAL SALPINO-OOPHORECTOMY;  Surgeon: [name], MD;  Location: [facility];  Service: Gynecology;  Laterality: Bilateral;   LAPAROSCOPIC TOTAL HYSTERECTOMY     TOOTH EXTRACTION  04/01/2019   tooth removal Bilateral 2022   4 teeth removed   UPPER GASTROINTESTINAL ENDOSCOPY  There were no vitals filed for this visit.   Subjective Assessment - 03/23/23 0916     Currently in Pain? No/denies    Pain Score 0-No pain                     Group Session:  S: Feeling okay today.   O: During today's OT group session, the patient participated in an educational segment about the importance of goal-setting and the application of the SMART framework to enhance daily life, particularly focusing on ADLs and iADLs. The session began with five open-ended pre-session questions that facilitated group discussion and introspection about their current  relationship with goals. Following the introduction and educational segment, participants engaged in brainstorming and group discussions to devise hypothetical SMART goals. The session concluded with five post-session questions to reinforce understanding and facilitate reflection. Throughout the session, there was a range of engagement levels noted among the participants.   A:  Patient demonstrated a high level of engagement throughout the session. They actively participated in discussions, sharing personal experiences related to goal setting and challenges faced. Patient was able to clearly articulate an understanding of the SMART framework and proposed personal SMART goals related to their own ADLs with minimal assistance. They expressed enthusiasm about applying what they learned to their daily routine and appeared motivated to make changes.   P: Continue to attend PHP OT group sessions 5x week for 4 weeks to promote daily structure, social engagement, and opportunities to develop and utilize adaptive strategies to maximize functional performance in preparation for safe transition and integration back into school, work, and the community. Plan to address topic of pt 3 in next OT group session.              OT Education - 03/23/23 0916     Education Details SMART Goals 2              OT Short Term Goals - 03/12/23 1025       OT SHORT TERM GOAL #1   Title Patient will be educated on strategies to improve psychosocial skills needed to participate fully in all daily, work, and leisure activities.    Time 4    Period Weeks    Status On-going    Target Date 04/11/23      OT SHORT TERM GOAL #2   Title Pt will apply psychosocial skills and coping mechanisms to daily activities in order to function independently and reintegrate into community dwelling    Status On-going      OT SHORT TERM GOAL #3   Title Pt will choose and/or engage in 1-3 socially engaging leisure activities  to improve social participation skills upon reintegrating into community    Status On-going                      Plan - 03/23/23 0917     Psychosocial Skills Habits;Routines and Behaviors;Interpersonal Interaction;Coping Strategies             Patient will benefit from skilled therapeutic intervention in order to improve the following deficits and impairments:       Psychosocial Skills: Habits, Routines and Behaviors, Interpersonal Interaction, Coping Strategies   Visit Diagnosis: Difficulty coping    Problem List Patient Active Problem List   Diagnosis Date Noted   Schizoaffective disorder (HCC) 03/01/2023   Poorly controlled mild persistent asthma 09/05/2021   Syncope 08/02/2021   Tobacco abuse 08/02/2021   Vaginal candidiasis 08/02/2021  CAP (community acquired pneumonia) 07/18/2021   OSA (obstructive sleep apnea) 07/18/2021   Hypokalemia 07/18/2021   Depression 03/24/2020   Bilateral low back pain with left-sided sciatica 03/24/2020   Left lumbar radiculopathy 02/25/2020   Absence of bladder continence 02/25/2020   History of postmenopausal bleeding 04/29/2019   Polypharmacy 04/03/2019   Emphysema, unspecified (HCC) 05/17/2018   Weakness 01/25/2017   Chronic migraine w/o aura w/o status migrainosus, not intractable 12/22/2014   Encounter for other general counseling or advice on contraception 09/02/2014   Bipolar disorder (HCC) 10/12/2011   Anxiety 10/12/2011   GERD (gastroesophageal reflux disease) 10/12/2011    Ted Mcalpine, OT 03/23/2023, 9:17 AM  Kerrin Champagne, OT   [facility] HOSPITALIZATION PROGRAM [address] [place], [place], [address] Phone: [phone]   Fax:  [phone]  Name: YANIRA TOLSMA MRN: [id number] Date of Birth: 1970/11/03

## 2023-03-23 NOTE — Psych (Signed)
 Virtual Visit via Video Note  I connected with MAKAYLYNN BONILLAS on 03/07/23 at  9:00 AM EST by a video enabled telemedicine application and verified that I am speaking with the correct person using two identifiers.  Location: Patient: patient home Provider: clinical home office   I discussed the limitations of evaluation and management by telemedicine and the availability of in person appointments. The patient expressed understanding and agreed to proceed.  I discussed the assessment and treatment plan with the patient. The patient was provided an opportunity to ask questions and all were answered. The patient agreed with the plan and demonstrated an understanding of the instructions.   The patient was advised to call back or seek an in-person evaluation if the symptoms worsen or if the condition fails to improve as anticipated.  Pt was provided 240 minutes of non-face-to-face time during this encounter.   Donia Guiles, LCSW   [facility] PHP THERAPIST PROGRESS NOTE  ARIADNE RISSMILLER [id number]  Session Time: 9:00 - 10:00  Participation Level: Active  Behavioral Response: CasualAlertDepressed  Type of Therapy: Group Therapy  Treatment Goals addressed: Coping  Progress Towards Goals: Initial  Interventions: CBT, DBT, Supportive, and Reframing  Summary: QUINCI GAVIDIA is a 53 y.o. female who presents with depression and AH symptoms.  Clinician led check-in regarding current stressors and situation, and review of patient completed daily inventory. Clinician utilized active listening and empathetic response and validated patient emotions. Clinician facilitated processing group on pertinent issues.   Therapist Response: Patient arrived within time allowed. Patient rates her mood at an 3 on a scale of 1-10 with 10 being best. Pt states she feels "not so great." Pt states she slept 4 broken hours and ate 2x. Pt reports she is coming down with a cold and is feeling anxious and shaky. Pt  reports she took a nap and watched youtube videos. Pt reports AH "wasn't so bad" and she was able to ignore it. Patient able to process. Patient engaged in discussion.          Session Time: 10:00 am - 11:00 am   Participation Level: Active   Behavioral Response: CasualAlertDepressed   Type of Therapy: Group Therapy   Treatment Goals addressed: Coping   Progress Towards Goals: Progressing   Interventions: CBT, DBT, Solution Focused, Strength-based, Supportive, and Reframing   Summary: Cln led processing group for pt's current struggles. Group members shared stressors and provided support and feedback. Cln brought in topics of boundaries, healthy relationships, and unhealthy thought processes to inform discussion.    Therapist Response:  Pt able to process and provide support to group.            Session Time: 11:00 -12:00   Participation Level: Active   Behavioral Response: CasualAlertDepressed   Type of Therapy: Group Therapy   Treatment Goals addressed: Coping   Progress Towards Goals: Progressing   Interventions: Strength-based, Supportive, and Reframing   Summary: Chaplaincy group with K. Claussen   Therapist Response: Pt participated and engaged in discussion.             Session Time: 12:00 -1:00   Participation Level: Active   Behavioral Response: CasualAlertDepressed   Type of Therapy: Group therapy   Treatment Goals addressed: Coping   Progress Towards Goals: Progressing   Interventions: OT group   Summary: 12:00 - 12:50: Occupational Therapy group with cln E. Hollan.  12:50 - 1:00 Clinician assessed for immediate needs, medication compliance and efficacy, and safety concerns.  Therapist Response: 12:00 - 12:50: See note 12:50 - 1:00 pm: At check-out, patient reports no immediate concerns. Patient demonstrates progress as evidenced by continued engagement and responsiveness to treatment. Patient denies SI/HI/self-harm thoughts at the end of  group.    Suicidal/Homicidal: Nowithout intent/plan  Plan: Pt will continue in PHP while working to decrease depression symptoms, increase awareness and management of AH, and increase ability to manage symptoms in a healthy manner.   Collaboration of Care: Medication Management AEB T Lewis, NP  Patient/Guardian was advised Release of Information must be obtained prior to any record release in order to collaborate their care with an outside provider. Patient/Guardian was advised if they have not already done so to contact the registration department to sign all necessary forms in order for [place] to release information regarding their care.   Consent: Patient/Guardian gives verbal consent for treatment and assignment of benefits for services provided during this visit. Patient/Guardian expressed understanding and agreed to proceed.   Diagnosis: Schizoaffective disorder, unspecified type (HCC) [F25.9]    1. Schizoaffective disorder, unspecified type (HCC)   2. Severe episode of recurrent major depressive disorder, with psychotic features (HCC)      Donia Guiles, LCSW

## 2023-03-23 NOTE — Progress Notes (Signed)
 Virtual Visit via Video Note  I connected with Lauren Chapman on 03/24/23 at  9:00 AM EST by a video enabled telemedicine application and verified that I am speaking with the correct person using two identifiers.  Location: Patient: Home  Provider: Office    I discussed the limitations of evaluation and management by telemedicine and the availability of in person appointments. The patient expressed understanding and agreed to proceed.    I discussed the assessment and treatment plan with the patient. The patient was provided an opportunity to ask questions and all were answered. The patient agreed with the plan and demonstrated an understanding of the instructions.   The patient was advised to call back or seek an in-person evaluation if the symptoms worsen or if the condition fails to improve as anticipated.  I provided 15 minutes of non-face-to-face time during this encounter.   Lauren Rack, NP   [facility] MD/PA/NP OP Progress Note  03/24/2023 12:13 PM Lauren Chapman  MRN:  [id number]  Chief Complaint:  Lauren Chapman 53 year old female with carries a diagnosis of schizoaffective disorder, bipolar disorder generalized anxiety disorder and posttraumatic stress disorder.  Currently she is prescribed Celexa, gabapentin, BuSpar and Navane.  Reports she has been taking and tolerating medications well.  Reports she was recently followed by [facility] for medication adjustments.  States they have titrated her BuSpar to 30 mg 3 times daily.  States she felt that has helped her mood.  Continues to endorse suicidal ideations denying plan or intent.    [name] stated that " my daughter has not made the decision as to exactly leaving date."  States her emotions are still all over the place due to the indecisiveness.  States " they still have to get their finances together."  Reports she continues to include relies coping skills that she is planning partial hospitalization programming.  She  reports plans to stepdown to intensive outpatient programming to continue care.  Rating her depression and anxiety 7 out of 10 evaluation.  Reports a fair appetite.  States she is resting okay throughout the night.  States her anxiety has improved since medication adjustments.  Staff to continue to monitor for safety.  Support, encouragement and reassurance was provided.   HPI:  Visit Diagnosis:    ICD-10-CM   1. Severe episode of recurrent major depressive disorder, with psychotic features (HCC)  F33.3     2. Schizoaffective disorder, unspecified type (HCC)  F25.9       Past Psychiatric History:   Past Medical History:  Past Medical History:  Diagnosis Date   Allergy    Anxiety    Bipolar affective psychosis (HCC)    Complication of anesthesia    " Acting silly- waving at everyone"-Giddy   Depression    Emphysema lung (HCC)    inhaler prn,  followed by pcp and pulmonologist-- dr [name]   Family history of adverse reaction to anesthesia    Father had rheumatic fever, and died on operating table.   GERD (gastroesophageal reflux disease)    Kidney cysts 01/24/2020   Right Kidney   Migraine    Moderate persistent asthma 09/05/2021   Smokers' cough (HCC)    per pt not productive   SUI (stress urinary incontinence, female)    Tubular adenoma of colon 07/08/2020   Wears glasses     Past Surgical History:  Procedure Laterality Date   ABDOMINAL HYSTERECTOMY N/A    Phreesia 11/30/2019   CARPAL TUNNEL RELEASE Right  09-12-2007   @[facility]    and GANGLION CYST EXCISION   COLONOSCOPY     CYSTOSCOPY N/A 04/29/2019   Procedure: CYSTOSCOPY;  Surgeon: [name], MD;  Location: [facility];  Service: Gynecology;  Laterality: N/A;   DILATATION & CURETTAGE/HYSTEROSCOPY WITH MYOSURE N/A 11/13/2018   Procedure: DILATATION & CURETTAGE/HYSTEROSCOPY WITH MYOSURE;  Surgeon: [name], MD;  Location: [facility];  Service: Gynecology;  Laterality: N/A;   request 9:00am OR start time in [place] Gyn block requests one hour   DILATION AND CURETTAGE OF UTERUS  02/2019   EYE SURGERY N/A    Phreesia 11/30/2019   GANGLION CYST EXCISION     R wrist   interstem      implant for overactive bladder   KNEE ARTHROSCOPY WITH ANTERIOR CRUCIATE LIGAMENT (ACL) REPAIR Right 10/2015   LAPAROSCOPIC CHOLECYSTECTOMY  1997   LAPAROSCOPIC HYSTERECTOMY Bilateral 04/29/2019   Procedure: HYSTERECTOMY TOTAL LAPAROSCOPIC, BILATERAL SALPINO-OOPHORECTOMY;  Surgeon: [name], MD;  Location: [facility];  Service: Gynecology;  Laterality: Bilateral;   LAPAROSCOPIC TOTAL HYSTERECTOMY     TOOTH EXTRACTION  04/01/2019   tooth removal Bilateral 2022   4 teeth removed   UPPER GASTROINTESTINAL ENDOSCOPY      Family Psychiatric History:   Family History:  Family History  Problem Relation Age of Onset   Depression Mother    Cancer Mother        Cervical   Alcohol abuse Father    Crohn's disease Sister    Stroke Brother 77   Migraines Daughter    GER disease Daughter    Depression Daughter    Bipolar disorder Daughter    Migraines Daughter    GER disease Daughter    Colon cancer Neg Hx    Rectal cancer Neg Hx    Stomach cancer Neg Hx     Social History:  Social History   Socioeconomic History   Marital status: Divorced    Spouse name: n/a   Number of children: 2   Years of education: 18   Highest education level: Master's degree (e.g., MA, MS, MEng, MEd, MSW, MBA)  Occupational History   Occupation: Disabled    Comment: Formerly a Runner, broadcasting/film/video.  Tobacco Use   Smoking status: Former    Current packs/day: 0.00    Average packs/day: 1 pack/day for 39.8 years (39.8 ttl pk-yrs)    Types: Cigarettes    Start date: 10/10/1981    Quit date: 07/18/2021    Years since quitting: 1.6    Passive exposure: Never   Smokeless tobacco: Never  Vaping Use   Vaping status: Never Used  Substance and Sexual Activity   Alcohol use: Never     Alcohol/week: 0.0 standard drinks of alcohol   Drug use: Never   Sexual activity: Not Currently    Partners: Male    Birth control/protection: Post-menopausal    Comment: 1st intercourse 53 yo-More than 5 partners  Other Topics Concern   Not on file  Social History Narrative   Lives at home with 1 of her two daughters   Right-handed.   2-4 cups caffeine daily.   Disabled    One story home   Social Drivers of Health   Financial Resource Strain: Low Risk  (01/09/2023)   Overall Financial Resource Strain (CARDIA)    Difficulty of Paying Living Expenses: Not very hard  Food Insecurity: Food Insecurity Present (03/02/2023)   Hunger Vital Sign    Worried About Running Out  of Food in the Last Year: Sometimes true    Ran Out of Food in the Last Year: Sometimes true  Transportation Needs: No Transportation Needs (03/02/2023)   PRAPARE - Administrator, Civil Service (Medical): No    Lack of Transportation (Non-Medical): No  Physical Activity: Inactive (01/09/2023)   Exercise Vital Sign    Days of Exercise per Week: 0 days    Minutes of Exercise per Session: 0 min  Stress: No Stress Concern Present (02/15/2023)   Received from [facility] - Occupational Stress Questionnaire    Feeling of Stress : Only a little  Recent Concern: Stress - Stress Concern Present (02/05/2023)   Received from [facility] of Occupational Health - Occupational Stress Questionnaire    Feeling of Stress : To some extent  Social Connections: Socially Isolated (01/09/2023)   Social Connection and Isolation Panel [NHANES]    Frequency of Communication with Friends and Family: Never    Frequency of Social Gatherings with Friends and Family: Never    Attends Religious Services: Never    Database administrator or Organizations: No    Attends Banker Meetings: Never    Marital Status: Divorced    Allergies:  Allergies   Allergen Reactions   Codeine Anaphylaxis and Other (See Comments)    Cannot have ANYTHING with codeine    Metabolic Disorder Labs: Lab Results  Component Value Date   HGBA1C 5.8 05/10/2022   No results found for: "PROLACTIN" Lab Results  Component Value Date   CHOL 153 11/07/2021   TRIG 134.0 11/07/2021   HDL 52.50 11/07/2021   CHOLHDL 3 11/07/2021   VLDL 26.8 11/07/2021   LDLCALC 73 11/07/2021   LDLCALC 99 05/27/2021   Lab Results  Component Value Date   TSH 1.33 07/07/2022   TSH 0.86 09/09/2018    Therapeutic Level Labs: No results found for: "LITHIUM" No results found for: "VALPROATE" No results found for: "CBMZ"  Current Medications: Current Outpatient Medications  Medication Sig Dispense Refill   albuterol (VENTOLIN HFA) 108 (90 Base) MCG/ACT inhaler Inhale 2 puffs into the lungs every 4 (four) hours as needed for wheezing or shortness of breath. 1 each 1   budesonide-formoterol (SYMBICORT) 160-4.5 MCG/ACT inhaler Inhale 2 puffs into the lungs 2 (two) times daily. 1 each 6   busPIRone (BUSPAR) 15 MG tablet Take 1 tablet (15 mg total) by mouth 3 (three) times daily. 30 tablet 0   carbamazepine (TEGRETOL XR) 100 MG 12 hr tablet Take 100 mg by mouth 2 (two) times daily.     citalopram (CELEXA) 20 MG tablet Take 20 mg by mouth daily.     EPINEPHrine 0.3 mg/0.3 mL IJ SOAJ injection Inject 0.3 mg into the muscle as needed for anaphylaxis. 1 each 0   esomeprazole (NEXIUM) 20 MG capsule Take 1 capsule (20 mg total) by mouth 2 (two) times daily before a meal. 60 capsule 1   gabapentin (NEURONTIN) 400 MG capsule Take 1 capsule (400 mg total) by mouth 2 (two) times daily. 20 capsule 0   ibuprofen (ADVIL) 800 MG tablet Take 800 mg by mouth every 8 (eight) hours as needed.     MYRBETRIQ 50 MG TB24 tablet Take 50 mg by mouth daily.     ondansetron (ZOFRAN) 4 MG tablet Take 1 tablet (4 mg total) by mouth every 8 (eight) hours as needed for nausea or vomiting. 20 tablet 6  Rimegepant Sulfate (NURTEC) 75 MG TBDP Take 1 tablet (75 mg total) by mouth daily as needed. 8 tablet 11   SUMAtriptan (IMITREX) 6 MG/0.5ML SOLN injection Inject 0.5 mLs (6 mg total) into the skin See admin instructions. INJECT 6 MG (0.5 ml) INTO THE SKIN AS NEEDED FOR MIGRAINE OR HEADACHE. MAY REPEAT ONCE IN 2 HOURS IF HEADACHE PERSISTS OR RECURS. MAX 2 DOSES IN 24 HOURS. 3 mL 5   thiothixene (NAVANE) 5 MG capsule Take 5 mg by mouth 3 (three) times daily.     tiZANidine (ZANAFLEX) 4 MG tablet Take 1 tablet (4 mg total) by mouth every 6 (six) hours as needed for muscle spasms. 30 tablet 5   traMADol (ULTRAM) 50 MG tablet Take 50 mg by mouth every 6 (six) hours as needed. (Patient not taking: Reported on 03/20/2023)     No current facility-administered medications for this visit.     Musculoskeletal: Strength & Muscle Tone: within normal limits Gait & Station: normal Patient leans: N/A  Psychiatric Specialty Exam: Review of Systems  Psychiatric/Behavioral:  Positive for suicidal ideas (" on and off"). The patient is nervous/anxious.   All other systems reviewed and are negative.   Last menstrual period 05/02/2019.There is no height or weight on file to calculate BMI.  General Appearance: Casual  Eye Contact:  Good  Speech:  Clear and Coherent  Volume:  Normal  Mood:  Anxious and Depressed  Affect:  Congruent  Thought Process:  Coherent  Orientation:  Full (Time, Place, and Person)  Thought Content: Logical   Suicidal Thoughts:  No  Homicidal Thoughts:  No  Memory:  Immediate;   Good Remote;   Good  Judgement:  Good  Insight:  Good  Psychomotor Activity:  Normal  Concentration:  Concentration: Good  Recall:  Good  Fund of Knowledge: Good  Language: Good  Akathisia:  No  Handed:  Right  AIMS (if indicated): done  Assets:  Communication Skills Desire for Improvement Social Support Talents/Skills  ADL's:  Intact  Cognition: WNL  Sleep:  Good   Screenings: GAD-7     Flowsheet Row Office Visit from 03/02/2023 in [facility] [place] [facility] Video Visit from 03/01/2023 [facility] Office Visit from 09/11/2022 in [facility] [place] [facility] Visit from 08/18/2022 in [facility] [facility] Visit from 07/07/2022 in [facility] [place] [facility]  Total GAD-7 Score 15 16 14 21 20       PHQ2-9    Flowsheet Row Counselor from 03/07/2023 in [facility] PARTIAL HOSPITALIZATION PROGRAM Office Visit from 03/02/2023 in [facility] [place] [facility] Video Visit from 03/01/2023 in [facility] PART[facility] HOSPITALIZATION PROGRAM Clinical Support from 01/09/2023 in [facility] [place] [facility] Visit from 09/11/2022 in [facility] [place] [facility] at [facility] Total Score 6 5 6 5 3   PHQ-9 Total Score 16 13 18 12 16       Flowsheet Row Counselor from 03/07/2023 in [facility] PARTIAL HOSPITALIZATION PROGRAM Video Visit from 03/01/2023 in BE[facility]IORAL [facility]ALTH PARTIAL HOSPITALIZATION PROGRAM ED from 01/18/2023 in [facility] Emergency Department at [facility]  C-SSRS RISK CATEGORY High Risk High Risk No Risk        Assessment and Plan:  Continue partial hospitalization programming Medications as indicated  Collaboration of Care: Collaboration of Care: Medication Management AEB reported recent adjustment to BuSpar  Patient/Guardian was advised Release of  Information must be obtained prior to any record release in order to collaborate their care with an outside provider. Patient/Guardian was advised if they have not already done so to contact the registration department to sign all necessary forms in order for [place] to release information regarding their care.   Consent: Patient/Guardian gives verbal consent for treatment and assignment of  benefits for services provided during this visit. Patient/Guardian expressed understanding and agreed to proceed.    Lauren Rack, NP 03/24/2023, 12:13 PM

## 2023-03-23 NOTE — Therapy (Signed)
 [facility] PARTIAL HOSPITALIZATION PROGRAM [address] [place], [place], [address] Phone: [phone]   Fax:  [phone]  Occupational Therapy Treatment Virtual Visit via Video Note  I connected with Lauren Chapman on 03/23/23 at  8:00 AM EST by a video enabled telemedicine application and verified that I am speaking with the correct person using two identifiers.  Location: Patient: home Provider: office   I discussed the limitations of evaluation and management by telemedicine and the availability of in person appointments. The patient expressed understanding and agreed to proceed.    The patient was advised to call back or seek an in-person evaluation if the symptoms worsen or if the condition fails to improve as anticipated.  I provided 55 minutes of non-face-to-face time during this encounter.   Patient Details  Name: Lauren Chapman MRN: [id number] Date of Birth: 26-Aug-1970 No data recorded  Encounter Date: 03/22/2023   OT End of Session - 03/23/23 1143     Visit Number 11    Number of Visits 20    Date for OT Re-Evaluation 04/11/23    OT Start Time 1200    OT Stop Time 1255    OT Time Calculation (min) 55 min             Past Medical History:  Diagnosis Date   Allergy    Anxiety    Bipolar affective psychosis (HCC)    Complication of anesthesia    " Acting silly- waving at everyone"-Giddy   Depression    Emphysema lung (HCC)    inhaler prn,  followed by pcp and pulmonologist-- dr [name]   Family history of adverse reaction to anesthesia    Father had rheumatic fever, and died on operating table.   GERD (gastroesophageal reflux disease)    Kidney cysts 01/24/2020   Right Kidney   Migraine    Moderate persistent asthma 09/05/2021   Smokers' cough (HCC)    per pt not productive   SUI (stress urinary incontinence, female)    Tubular adenoma of colon 07/08/2020   Wears glasses     Past Surgical History:  Procedure  Laterality Date   ABDOMINAL HYSTERECTOMY N/A    Phreesia 11/30/2019   CARPAL TUNNEL RELEASE Right 09-12-2007   @[facility]    and GANGLION CYST EXCISION   COLONOSCOPY     CYSTOSCOPY N/A 04/29/2019   Procedure: CYSTOSCOPY;  Surgeon: [name], MD;  Location: [facility];  Service: Gynecology;  Laterality: N/A;   DILATATION & CURETTAGE/HYSTEROSCOPY WITH MYOSURE N/A 11/13/2018   Procedure: DILATATION & CURETTAGE/HYSTEROSCOPY WITH MYOSURE;  Surgeon: [name], MD;  Location: [facility];  Service: Gynecology;  Laterality: N/A;  request 9:00am OR start time in [place] Gyn block requests one hour   DILATION AND CURETTAGE OF UTERUS  02/2019   EYE SURGERY N/A    Phreesia 11/30/2019   GANGLION CYST EXCISION     R wrist   interstem      implant for overactive bladder   KNEE ARTHROSCOPY WITH ANTERIOR CRUCIATE LIGAMENT (ACL) REPAIR Right 10/2015   LAPAROSCOPIC CHOLECYSTECTOMY  1997   LAPAROSCOPIC HYSTERECTOMY Bilateral 04/29/2019   Procedure: HYSTERECTOMY TOTAL LAPAROSCOPIC, BILATERAL SALPINO-OOPHORECTOMY;  Surgeon: [name], MD;  Location: [facility];  Service: Gynecology;  Laterality: Bilateral;   LAPAROSCOPIC TOTAL HYSTERECTOMY     TOOTH EXTRACTION  04/01/2019   tooth removal Bilateral 2022   4 teeth removed   UPPER GASTROINTESTINAL ENDOSCOPY  There were no vitals filed for this visit.   Subjective Assessment - 03/23/23 1143     Currently in Pain? No/denies    Pain Score 0-No pain                Group Session:  S: Doing okay today I believe.   O: In this communication group therapy session, facilitated by an occupational therapist, participants explored several key subtopics aimed at enhancing their interpersonal skills. The session began with a discussion on the use of "I" and "AND" statements, emphasizing personal responsibility and constructive language in expressing feelings and needs. Participants then  practiced active listening techniques to improve their ability to fully understand and respond to others. The group also delved into assertive communication, learning how to express themselves confidently and respectfully. Emotional regulation skills were addressed, providing strategies for managing emotions during interactions. Social skills training included role-playing scenarios to build confidence in various social contexts. Feedback and reflection were integral parts of the session, with participants offering and receiving constructive feedback to foster personal growth. The group identified common barriers to effective communication, such as anxiety, fear of judgment, and past negative experiences, and discussed strategies to overcome these challenges, including mindfulness practices and building a supportive network.   A: The patient actively participated in the group therapy session, demonstrating a high level of engagement and enthusiasm. They contributed to discussions on "I" and "AND" statements by sharing personal examples and insights. During the active listening exercises, the patient was attentive and provided thoughtful feedback to peers. They effectively practiced assertive communication techniques and showed a clear understanding of emotional regulation strategies. In social skills role-playing, the patient was confident and responsive, indicating a good grasp of the concepts. The patient also engaged in the feedback and reflection segment, offering constructive comments and showing receptivity to feedback from others. Their proactive approach and willingness to explore personal barriers to communication suggest significant progress and motivation to improve interpersonal skills.    P: Continue to attend PHP OT group sessions 5x week for 4 weeks to promote daily structure, social engagement, and opportunities to develop and utilize adaptive strategies to maximize functional performance in  preparation for safe transition and integration back into school, work, and the community. Plan to address topic of pt 2 in next OT group session.                   OT Education - 03/23/23 1143     Education Details Communication 1              OT Short Term Goals - 03/12/23 1025       OT SHORT TERM GOAL #1   Title Patient will be educated on strategies to improve psychosocial skills needed to participate fully in all daily, work, and leisure activities.    Time 4    Period Weeks    Status On-going    Target Date 04/11/23      OT SHORT TERM GOAL #2   Title Pt will apply psychosocial skills and coping mechanisms to daily activities in order to function independently and reintegrate into community dwelling    Status On-going      OT SHORT TERM GOAL #3   Title Pt will choose and/or engage in 1-3 socially engaging leisure activities to improve social participation skills upon reintegrating into community    Status On-going  Plan - 03/23/23 1143     Psychosocial Skills Habits;Routines and Behaviors;Interpersonal Interaction;Coping Strategies             Patient will benefit from skilled therapeutic intervention in order to improve the following deficits and impairments:       Psychosocial Skills: Habits, Routines and Behaviors, Interpersonal Interaction, Coping Strategies   Visit Diagnosis: Difficulty coping    Problem List Patient Active Problem List   Diagnosis Date Noted   Schizoaffective disorder (HCC) 03/01/2023   Poorly controlled mild persistent asthma 09/05/2021   Syncope 08/02/2021   Tobacco abuse 08/02/2021   Vaginal candidiasis 08/02/2021   CAP (community acquired pneumonia) 07/18/2021   OSA (obstructive sleep apnea) 07/18/2021   Hypokalemia 07/18/2021   Depression 03/24/2020   Bilateral low back pain with left-sided sciatica 03/24/2020   Left lumbar radiculopathy 02/25/2020   Absence of bladder  continence 02/25/2020   History of postmenopausal bleeding 04/29/2019   Polypharmacy 04/03/2019   Emphysema, unspecified (HCC) 05/17/2018   Weakness 01/25/2017   Chronic migraine w/o aura w/o status migrainosus, not intractable 12/22/2014   Encounter for other general counseling or advice on contraception 09/02/2014   Bipolar disorder (HCC) 10/12/2011   Anxiety 10/12/2011   GERD (gastroesophageal reflux disease) 10/12/2011    Ted Mcalpine, OT 03/23/2023, 11:43 AM  Kerrin Champagne, OT   [facility] HOSPITALIZATION PROGRAM [address] [place], [place], [address] Phone: [phone]   Fax:  [phone]  Name: RUBYANN LINGLE MRN: [id number] Date of Birth: [birth year]

## 2023-03-23 NOTE — Therapy (Signed)
 [facility] PARTIAL HOSPITALIZATION PROGRAM [address] [place], [place], [address] Phone: [phone]   Fax:  [phone]  Occupational Therapy Treatment Virtual Visit via Video Note  I connected with Lauren Chapman on 03/23/23 at  8:00 AM EST by a video enabled telemedicine application and verified that I am speaking with the correct person using two identifiers.  Location: Patient: home Provider: office   I discussed the limitations of evaluation and management by telemedicine and the availability of in person appointments. The patient expressed understanding and agreed to proceed.    The patient was advised to call back or seek an in-person evaluation if the symptoms worsen or if the condition fails to improve as anticipated.  I provided 55 minutes of non-face-to-face time during this encounter.   Patient Details  Name: Lauren Chapman MRN: [id number] Date of Birth: Aug 01, 1970 No data recorded  Encounter Date: 03/19/2023   OT End of Session - 03/23/23 0909     Visit Number 9    Number of Visits 20    Date for OT Re-Evaluation 04/11/23    OT Start Time 1200    OT Stop Time 1255    OT Time Calculation (min) 55 min             Past Medical History:  Diagnosis Date   Allergy    Anxiety    Bipolar affective psychosis (HCC)    Complication of anesthesia    " Acting silly- waving at everyone"-Giddy   Depression    Emphysema lung (HCC)    inhaler prn,  followed by pcp and pulmonologist-- dr [name]   Family history of adverse reaction to anesthesia    Father had rheumatic fever, and died on operating table.   GERD (gastroesophageal reflux disease)    Kidney cysts 01/24/2020   Right Kidney   Migraine    Moderate persistent asthma 09/05/2021   Smokers' cough (HCC)    per pt not productive   SUI (stress urinary incontinence, female)    Tubular adenoma of colon 07/08/2020   Wears glasses     Past Surgical History:  Procedure  Laterality Date   ABDOMINAL HYSTERECTOMY N/A    Phreesia 11/30/2019   CARPAL TUNNEL RELEASE Right 09-12-2007   @[facility]    and GANGLION CYST EXCISION   COLONOSCOPY     CYSTOSCOPY N/A 04/29/2019   Procedure: CYSTOSCOPY;  Surgeon: [name], MD;  Location: [facility];  Service: Gynecology;  Laterality: N/A;   DILATATION & CURETTAGE/HYSTEROSCOPY WITH MYOSURE N/A 11/13/2018   Procedure: DILATATION & CURETTAGE/HYSTEROSCOPY WITH MYOSURE;  Surgeon: [name], MD;  Location: [facility];  Service: Gynecology;  Laterality: N/A;  request 9:00am OR start time in [place] [facility] block requests one hour   DILATION AND CURETTAGE OF UTERUS  02/2019   EYE SURGERY N/A    Phreesia 11/30/2019   GANGLION CYST EXCISION     R wrist   interstem      implant for overactive bladder   KNEE ARTHROSCOPY WITH ANTERIOR CRUCIATE LIGAMENT (ACL) REPAIR Right 10/2015   LAPAROSCOPIC CHOLECYSTECTOMY  1997   LAPAROSCOPIC HYSTERECTOMY Bilateral 04/29/2019   Procedure: HYSTERECTOMY TOTAL LAPAROSCOPIC, BILATERAL SALPINO-OOPHORECTOMY;  Surgeon: [name], MD;  Location: [facility];  Service: Gynecology;  Laterality: Bilateral;   LAPAROSCOPIC TOTAL HYSTERECTOMY     TOOTH EXTRACTION  04/01/2019   tooth removal Bilateral 2022   4 teeth removed   UPPER GASTROINTESTINAL ENDOSCOPY  There were no vitals filed for this visit.   Subjective Assessment - 03/23/23 0909     Currently in Pain? No/denies    Pain Score 0-No pain                 Group Session:  S: Trying  to feel better today.   O: During today's OT group session, the patient participated in an educational segment about the importance of goal-setting and the application of the SMART framework to enhance daily life, particularly focusing on ADLs and iADLs. The session began with five open-ended pre-session questions that facilitated group discussion and introspection about their current  relationship with goals. Following the introduction and educational segment, participants engaged in brainstorming and group discussions to devise hypothetical SMART goals. The session concluded with five post-session questions to reinforce understanding and facilitate reflection. Throughout the session, there was a range of engagement levels noted among the participants.   A:  Patient demonstrated a high level of engagement throughout the session. They actively participated in discussions, sharing personal experiences related to goal setting and challenges faced. Patient was able to clearly articulate an understanding of the SMART framework and proposed personal SMART goals related to their own ADLs with minimal assistance. They expressed enthusiasm about applying what they learned to their daily routine and appeared motivated to make changes.   P: Continue to attend PHP OT group sessions 5x week for 4 weeks to promote daily structure, social engagement, and opportunities to develop and utilize adaptive strategies to maximize functional performance in preparation for safe transition and integration back into school, work, and the community. Plan to address topic of pt 2 in next OT group session.                    OT Education - 03/23/23 0909     Education Details SMART Goals              OT Short Term Goals - 03/12/23 1025       OT SHORT TERM GOAL #1   Title Patient will be educated on strategies to improve psychosocial skills needed to participate fully in all daily, work, and leisure activities.    Time 4    Period Weeks    Status On-going    Target Date 04/11/23      OT SHORT TERM GOAL #2   Title Pt will apply psychosocial skills and coping mechanisms to daily activities in order to function independently and reintegrate into community dwelling    Status On-going      OT SHORT TERM GOAL #3   Title Pt will choose and/or engage in 1-3 socially engaging leisure  activities to improve social participation skills upon reintegrating into community    Status On-going                      Plan - 03/23/23 0909     Psychosocial Skills Habits;Routines and Behaviors;Interpersonal Interaction;Coping Strategies             Patient will benefit from skilled therapeutic intervention in order to improve the following deficits and impairments:       Psychosocial Skills: Habits, Routines and Behaviors, Interpersonal Interaction, Coping Strategies   Visit Diagnosis: Difficulty coping    Problem List Patient Active Problem List   Diagnosis Date Noted   Schizoaffective disorder (HCC) 03/01/2023   Poorly controlled mild persistent asthma 09/05/2021   Syncope 08/02/2021   Tobacco abuse 08/02/2021  Vaginal candidiasis 08/02/2021   CAP (community acquired pneumonia) 07/18/2021   OSA (obstructive sleep apnea) 07/18/2021   Hypokalemia 07/18/2021   Depression 03/24/2020   Bilateral low back pain with left-sided sciatica 03/24/2020   Left lumbar radiculopathy 02/25/2020   Absence of bladder continence 02/25/2020   History of postmenopausal bleeding 04/29/2019   Polypharmacy 04/03/2019   Emphysema, unspecified (HCC) 05/17/2018   Weakness 01/25/2017   Chronic migraine w/o aura w/o status migrainosus, not intractable 12/22/2014   Encounter for other general counseling or advice on contraception 09/02/2014   Bipolar disorder (HCC) 10/12/2011   Anxiety 10/12/2011   GERD (gastroesophageal reflux disease) 10/12/2011    Ted Mcalpine, OT 03/23/2023, 9:09 AM Kerrin Champagne, OT  [facility] HOSPITALIZATION PROGRAM [address] [place], [place], [address] Phone: [phone]   Fax:  [phone]  Name: Lauren Chapman MRN: [id number] Date of Birth: 1970-11-25

## 2023-03-23 NOTE — Psych (Signed)
 Virtual Visit via Video Note  I connected with Lauren Chapman on 0211/25 at  9:00 AM EST by a video enabled telemedicine application and verified that I am speaking with the correct person using two identifiers.  Location: Patient: patient home Provider: clinical home office   I discussed the limitations of evaluation and management by telemedicine and the availability of in person appointments. The patient expressed understanding and agreed to proceed.  I discussed the assessment and treatment plan with the patient. The patient was provided an opportunity to ask questions and all were answered. The patient agreed with the plan and demonstrated an understanding of the instructions.   The patient was advised to call back or seek an in-person evaluation if the symptoms worsen or if the condition fails to improve as anticipated.  Pt was provided 240 minutes of non-face-to-face time during this encounter.   Donia Guiles, LCSW   [facility] BH PHP THERAPIST PROGRESS NOTE  HASSIE MANDT [id number]  Session Time: 9:00 - 10:00  Participation Level: Active  Behavioral Response: CasualAlertDepressed  Type of Therapy: Group Therapy  Treatment Goals addressed: Coping  Progress Towards Goals: Initial  Interventions: CBT, DBT, Supportive, and Reframing  Summary: Lauren Chapman is a 53 y.o. female who presents with depression and AH symptoms.  Clinician led check-in regarding current stressors and situation, and review of patient completed daily inventory. Clinician utilized active listening and empathetic response and validated patient emotions. Clinician facilitated processing group on pertinent issues.   Therapist Response: Patient arrived within time allowed. Patient rates her mood at an 6 on a scale of 1-10 with 10 being best. Pt states she feels "really anxious." Pt states she slept 8 hours and ate 1x. Pt reports her anxiety has been heightened the "past few days" and she is feeling  restless, has a knot in her stomach, and decreased concentration. Pt reports she is supposed to see her mom today which is also difficult as her mom is unsupportive and says hurtful things. Pt reports baseline AH today and that they are "not loud" and easy to ignore. Pt denies SI.  Patient able to process. Patient engaged in discussion.          Session Time: 10:00 am - 11:00 am   Participation Level: Active   Behavioral Response: CasualAlertDepressed   Type of Therapy: Group Therapy   Treatment Goals addressed: Coping   Progress Towards Goals: Progressing   Interventions: CBT, DBT, Solution Focused, Strength-based, Supportive, and Reframing   Summary: Cln led discussion on healthy aggression substitutes. Cln discussed the benefits to discharging energy and adrenaline when feeling "revved up" in emotion and the importance of balancing that discharge with safety and lack of negative consequences. Group brainstormed different ways to channel aggression in a healthy way and shared ways that have worked for them in the past.   Therapist Response:  Pt engaged in discussion and identifies 3 options to try.            Session Time: 11:00 -12:00   Participation Level: Active   Behavioral Response: CasualAlertDepressed   Type of Therapy: Group Therapy   Treatment Goals addressed: Coping   Progress Towards Goals: Progressing   Interventions: CBT, DBT, Solution Focused, Strength-based, Supportive, and Reframing   Summary: Cln continued topic of DBT distress tolerance skills and the ACCEPTS distraction skill. Group reviewed E-P skills and discussed how they can practice them in their every day life.    Therapist Response:  Pt engaged in  discussion and is able to state ways to apply these skills.              Session Time: 12:00 -1:00   Participation Level: Active   Behavioral Response: CasualAlertDepressed   Type of Therapy: Group therapy   Treatment Goals addressed:  Coping   Progress Towards Goals: Progressing   Interventions: OT group   Summary: 12:00 - 12:50: Occupational Therapy group with cln E. Hollan.  12:50 - 1:00 Clinician assessed for immediate needs, medication compliance and efficacy, and safety concerns.   Therapist Response: 12:00 - 12:50: See note 12:50 - 1:00 pm: At check-out, patient reports no immediate concerns. Patient demonstrates progress as evidenced by participating in first group session. Patient denies SI/HI/self-harm thoughts at the end of group.    Suicidal/Homicidal: Nowithout intent/plan  Plan: Pt will continue in PHP while working to decrease depression symptoms, increase awareness and management of AH, and increase ability to manage symptoms in a healthy manner.   Collaboration of Care: Medication Management AEB T Lewis, NP  Patient/Guardian was advised Release of Information must be obtained prior to any record release in order to collaborate their care with an outside provider. Patient/Guardian was advised if they have not already done so to contact the registration department to sign all necessary forms in order for [place] to release information regarding their care.   Consent: Patient/Guardian gives verbal consent for treatment and assignment of benefits for services provided during this visit. Patient/Guardian expressed understanding and agreed to proceed.   Diagnosis: Schizoaffective disorder, unspecified type (HCC) [F25.9]    1. Schizoaffective disorder, unspecified type (HCC)   2. Severe episode of recurrent major depressive disorder, with psychotic features (HCC)      Donia Guiles, LCSW

## 2023-03-25 NOTE — Psych (Signed)
 Virtual Visit via Video Note  I connected with Lauren Chapman on 03/08/23 at  9:00 AM EST by a video enabled telemedicine application and verified that I am speaking with the correct person using two identifiers.  Location: Patient: patient home Provider: clinical home office   I discussed the limitations of evaluation and management by telemedicine and the availability of in person appointments. The patient expressed understanding and agreed to proceed.  I discussed the assessment and treatment plan with the patient. The patient was provided an opportunity to ask questions and all were answered. The patient agreed with the plan and demonstrated an understanding of the instructions.   The patient was advised to call back or seek an in-person evaluation if the symptoms worsen or if the condition fails to improve as anticipated.  Pt was provided 240 minutes of non-face-to-face time during this encounter.   Donia Guiles, LCSW   [facility] BH PHP THERAPIST PROGRESS NOTE  Lauren Chapman [id number]  Session Time: 9:00 - 10:00  Participation Level: Active  Behavioral Response: CasualAlertDepressed  Type of Therapy: Group Therapy  Treatment Goals addressed: Coping  Progress Towards Goals: Initial  Interventions: CBT, DBT, Supportive, and Reframing  Summary: Lauren Chapman is a 53 y.o. female who presents with depression and AH symptoms.  Clinician led check-in regarding current stressors and situation, and review of patient completed daily inventory. Clinician utilized active listening and empathetic response and validated patient emotions. Clinician facilitated processing group on pertinent issues.   Therapist Response: Patient arrived within time allowed. Patient rates her mood at an 5 on a scale of 1-10 with 10 being best. Pt states she feels "anxious." Pt states she slept 10.5 hours and ate 1x. Pt reports continued anxiety re: having to visit her mom. Pt reports her cold continues  to plague her and she rested most of the day. Pt reports AH are "plugging my ears" and that they are more persistent today. Pt reports they are not command and she is not "too bothered" by them. Pt denies SI.  Patient able to process. Patient engaged in discussion.          Session Time: 10:00 am - 11:00 am   Participation Level: Active   Behavioral Response: CasualAlertDepressed   Type of Therapy: Group Therapy   Treatment Goals addressed: Coping   Progress Towards Goals: Progressing   Interventions: CBT, DBT, Solution Focused, Strength-based, Supportive, and Reframing   Summary: Cln introduced wellness topic of sleep hygiene. Cln discussed ways in which poor sleep affects our mood and overall wellness. Cln provided education on sleep hygiene techniques and principles. Group discussed their current sleep issues and how they can apply sleep hygiene skills to improve their quality of sleep.    Therapist Response: Pt engaged in discussion and identifies which sleep hygiene skill they will apply first.              Session Time: 11:00 -12:00   Participation Level: Active   Behavioral Response: CasualAlertDepressed   Type of Therapy: Group Therapy   Treatment Goals addressed: Coping   Progress Towards Goals: Progressing   Interventions: CBT, DBT, Solution Focused, Strength-based, Supportive, and Reframing   Summary: Cln continued topic of DBT distress tolerance skills and the ACCEPTS distraction skill. Group reviewed T-S skills and discussed how they can practice them in their every day life.    Therapist Response:  Pt engaged in discussion and is able to state ways to apply these skills.  Session Time: 12:00 -1:00   Participation Level: Active   Behavioral Response: CasualAlertDepressed   Type of Therapy: Group therapy   Treatment Goals addressed: Coping   Progress Towards Goals: Progressing   Interventions: OT group   Summary: 12:00 - 12:50:  Occupational Therapy group with cln E. Hollan.  12:50 - 1:00 Clinician assessed for immediate needs, medication compliance and efficacy, and safety concerns.   Therapist Response: 12:00 - 12:50: See note 12:50 - 1:00 pm: At check-out, patient reports no immediate concerns. Patient demonstrates progress as evidenced by continued engagement and responsiveness to treatment. Patient denies SI/HI/self-harm thoughts at the end of group.    Suicidal/Homicidal: Nowithout intent/plan  Plan: Pt will continue in PHP while working to decrease depression symptoms, increase awareness and management of AH, and increase ability to manage symptoms in a healthy manner.   Collaboration of Care: Medication Management AEB T Lewis, NP  Patient/Guardian was advised Release of Information must be obtained prior to any record release in order to collaborate their care with an outside provider. Patient/Guardian was advised if they have not already done so to contact the registration department to sign all necessary forms in order for [place] to release information regarding their care.   Consent: Patient/Guardian gives verbal consent for treatment and assignment of benefits for services provided during this visit. Patient/Guardian expressed understanding and agreed to proceed.   Diagnosis: Schizoaffective disorder, unspecified type (HCC) [F25.9]    1. Schizoaffective disorder, unspecified type (HCC)      Donia Guiles, LCSW

## 2023-03-25 NOTE — Psych (Signed)
 Virtual Visit via Video Note  I connected with Lauren Chapman on 03/12/23 at  9:00 AM EST by a video enabled telemedicine application and verified that I am speaking with the correct person using two identifiers.  Location: Patient: patient home Provider: clinical home office   I discussed the limitations of evaluation and management by telemedicine and the availability of in person appointments. The patient expressed understanding and agreed to proceed.  I discussed the assessment and treatment plan with the patient. The patient was provided an opportunity to ask questions and all were answered. The patient agreed with the plan and demonstrated an understanding of the instructions.   The patient was advised to call back or seek an in-person evaluation if the symptoms worsen or if the condition fails to improve as anticipated.  Pt was provided 240 minutes of non-face-to-face time during this encounter.   Donia Guiles, LCSW   [facility] BH PHP THERAPIST PROGRESS NOTE  Lauren Chapman [id number]  Session Time: 9:00 - 10:00  Participation Level: Active  Behavioral Response: CasualAlertDepressed  Type of Therapy: Group Therapy  Treatment Goals addressed: Coping  Progress Towards Goals: Progressing  Interventions: CBT, DBT, Supportive, and Reframing  Summary: Lauren Chapman is a 53 y.o. female who presents with depression and AH symptoms.  Clinician led check-in regarding current stressors and situation, and review of patient completed daily inventory. Clinician utilized active listening and empathetic response and validated patient emotions. Clinician facilitated processing group on pertinent issues.   Therapist Response: Patient arrived within time allowed. Patient rates her mood at an 6 on a scale of 1-10 with 10 being best. Pt states she feels "anxious." Pt states she slept 6 hours and ate 0x. Pt reports she "sleep eats" when stressed and did over the weekend however did not eat  anything knowingly. Pt reports she worked for a short shift on Saturday and it was taxing and difficult on her anxiety-wise and physically. Pt reports high stress because the family pet is ill. Pt reports AH is present with negative talk but no command and denies SI. Patient able to process. Patient engaged in discussion.          Session Time: 10:00 am - 11:00 am   Participation Level: Active   Behavioral Response: CasualAlertDepressed   Type of Therapy: Group Therapy   Treatment Goals addressed: Coping   Progress Towards Goals: Progressing   Interventions: CBT, DBT, Solution Focused, Strength-based, Supportive, and Reframing   Summary: Cln led discussion on ways to manage our feelings. Cln encouraged pt's to rank their feeling in intensity 1-10 with 10 being most intense. If feeling is ranked 5+, pt's main job is to manage the feeling via coping skill. If the feeling is ranked 4 or below, coping or problem solving can be attempted. Cln brought in CBT thought challenging and DBT distress tolerance skills to aid discussion. Group members discussed how to apply and utilize this information.    Therapist Response: Pt engaged in discussion and reports understanding.           Session Time: 11:00 -12:00   Participation Level: Active   Behavioral Response: CasualAlertDepressed   Type of Therapy: Group Therapy   Treatment Goals addressed: Coping   Progress Towards Goals: Progressing   Interventions: CBT, DBT, Solution Focused, Strength-based, Supportive, and Reframing   Summary: Cln led discussion on saying "no." Group members shared struggles they have with saying no and how it affects them. Cln utilized boundaries, communication, and self-esteem  tenets to inform discussion.    Therapist Response:  Pt engaged in discussion and is able to process.              Session Time: 12:00 -1:00   Participation Level: Active   Behavioral Response: CasualAlertDepressed   Type of  Therapy: Group therapy   Treatment Goals addressed: Coping   Progress Towards Goals: Progressing   Interventions: OT group   Summary: 12:00 - 12:50: Occupational Therapy group with cln E. Hollan.  12:50 - 1:00 Clinician assessed for immediate needs, medication compliance and efficacy, and safety concerns.   Therapist Response: 12:00 - 12:50: See note 12:50 - 1:00 pm: At check-out, patient reports no immediate concerns. Patient demonstrates progress as evidenced by continued engagement and responsiveness to treatment. Patient denies SI/HI/self-harm thoughts at the end of group.    Suicidal/Homicidal: Nowithout intent/plan  Plan: Pt will continue in PHP while working to decrease depression symptoms, increase awareness and management of AH, and increase ability to manage symptoms in a healthy manner.   Collaboration of Care: Medication Management AEB T Lewis, NP  Patient/Guardian was advised Release of Information must be obtained prior to any record release in order to collaborate their care with an outside provider. Patient/Guardian was advised if they have not already done so to contact the registration department to sign all necessary forms in order for [place] to release information regarding their care.   Consent: Patient/Guardian gives verbal consent for treatment and assignment of benefits for services provided during this visit. Patient/Guardian expressed understanding and agreed to proceed.   Diagnosis: Schizoaffective disorder, unspecified type (HCC) [F25.9]    1. Schizoaffective disorder, unspecified type (HCC)      Donia Guiles, LCSW

## 2023-03-25 NOTE — Psych (Signed)
 Virtual Visit via Video Note  I connected with JEMIAH CUADRA on 03/14/23 at  9:00 AM EST by a video enabled telemedicine application and verified that I am speaking with the correct person using two identifiers.  Location: Patient: patient home Provider: clinical home office   I discussed the limitations of evaluation and management by telemedicine and the availability of in person appointments. The patient expressed understanding and agreed to proceed.  I discussed the assessment and treatment plan with the patient. The patient was provided an opportunity to ask questions and all were answered. The patient agreed with the plan and demonstrated an understanding of the instructions.   The patient was advised to call back or seek an in-person evaluation if the symptoms worsen or if the condition fails to improve as anticipated.  Pt was provided 240 minutes of non-face-to-face time during this encounter.   Donia Guiles, LCSW   [facility] PHP THERAPIST PROGRESS NOTE  ONIA SHIFLETT [id number]  Session Time: 9:00 - 10:00  Participation Level: Active  Behavioral Response: CasualAlertDepressed  Type of Therapy: Group Therapy  Treatment Goals addressed: Coping  Progress Towards Goals: Progressing  Interventions: CBT, DBT, Supportive, and Reframing  Summary: RODOLFO NOTARO is a 53 y.o. female who presents with depression and AH symptoms.  Clinician led check-in regarding current stressors and situation, and review of patient completed daily inventory. Clinician utilized active listening and empathetic response and validated patient emotions. Clinician facilitated processing group on pertinent issues.   Therapist Response: Patient arrived within time allowed. Patient rates her mood at a 4 on a scale of 1-10 with 10 being best. Pt states she feels "okay." Pt states she slept 6 hours and ate 1x. Pt reports her cold is improving but she continues to feel drained. Pt states she returns  to full 6 hr shifts tomorrow and is worried about dealing with coworkers, customers, and maintaining stability.  Pt reports low/quiet AH. Patient able to process. Patient engaged in discussion.          Session Time: 10:00 am - 11:00 am   Participation Level: Active   Behavioral Response: CasualAlertDepressed   Type of Therapy: Group Therapy   Treatment Goals addressed: Coping   Progress Towards Goals: Progressing   Interventions: CBT, DBT, Solution Focused, Strength-based, Supportive, and Reframing   Summary: Cln led processing group for pt's current struggles. Group members shared stressors and provided support and feedback. Cln brought in topics of boundaries, healthy relationships, and unhealthy thought processes to inform discussion.    Therapist Response:  Pt able to process and provide support to group.            Session Time: 11:00 -12:00   Participation Level: Active   Behavioral Response: CasualAlertDepressed   Type of Therapy: Group Therapy   Treatment Goals addressed: Coping   Progress Towards Goals: Progressing   Interventions: Strength-based, Supportive, and Reframing   Summary: Chaplaincy group with K. Claussen   Therapist Response: Pt participated and engaged in discussion.             Session Time: 12:00 -1:00   Participation Level: Active   Behavioral Response: CasualAlertDepressed   Type of Therapy: Group therapy   Treatment Goals addressed: Coping   Progress Towards Goals: Progressing   Interventions: OT group   Summary: 12:00 - 12:50: Occupational Therapy group with cln E. Hollan.  12:50 - 1:00 Clinician assessed for immediate needs, medication compliance and efficacy, and safety concerns.   Therapist Response:  12:00 - 12:50: See note 12:50 - 1:00 pm: At check-out, patient reports no immediate concerns. Patient demonstrates progress as evidenced by continued engagement and responsiveness to treatment. Patient denies  SI/HI/self-harm thoughts at the end of group.    Suicidal/Homicidal: Nowithout intent/plan  Plan: Pt will continue in PHP while working to decrease depression symptoms, increase awareness and management of AH, and increase ability to manage symptoms in a healthy manner.   Collaboration of Care: Medication Management AEB T Lewis, NP  Patient/Guardian was advised Release of Information must be obtained prior to any record release in order to collaborate their care with an outside provider. Patient/Guardian was advised if they have not already done so to contact the registration department to sign all necessary forms in order for [place] to release information regarding their care.   Consent: Patient/Guardian gives verbal consent for treatment and assignment of benefits for services provided during this visit. Patient/Guardian expressed understanding and agreed to proceed.   Diagnosis: Schizoaffective disorder, unspecified type (HCC) [F25.9]    1. Schizoaffective disorder, unspecified type (HCC)      Donia Guiles, LCSW

## 2023-03-25 NOTE — Psych (Signed)
 Virtual Visit via Video Note  I connected with Lauren Chapman on 03/20/23 at  9:00 AM EST by a video enabled telemedicine application and verified that I am speaking with the correct person using two identifiers.  Location: Patient: patient home Provider: clinical home office   I discussed the limitations of evaluation and management by telemedicine and the availability of in person appointments. The patient expressed understanding and agreed to proceed.  I discussed the assessment and treatment plan with the patient. The patient was provided an opportunity to ask questions and all were answered. The patient agreed with the plan and demonstrated an understanding of the instructions.   The patient was advised to call back or seek an in-person evaluation if the symptoms worsen or if the condition fails to improve as anticipated.  Pt was provided 240 minutes of non-face-to-face time during this encounter.   Donia Guiles, LCSW   [facility] PHP THERAPIST PROGRESS NOTE  Lauren Chapman [id number]  Session Time: 9:00 - 10:00  Participation Level: Active  Behavioral Response: CasualAlertDepressed  Type of Therapy: Group Therapy  Treatment Goals addressed: Coping  Progress Towards Goals: Progressing  Interventions: CBT, DBT, Supportive, and Reframing  Summary: Lauren Chapman is a 53 y.o. female who presents with depression and AH symptoms.  Clinician led check-in regarding current stressors and situation, and review of patient completed daily inventory. Clinician utilized active listening and empathetic response and validated patient emotions. Clinician facilitated processing group on pertinent issues.   Therapist Response: Patient arrived within time allowed. Patient rates her mood at a 3 on a scale of 1-10 with 10 being best. Pt states she feels "worn out." Pt states she slept 10 hours and ate 1x. Pt reports she continued to cry most of yesterday and her anxiety is "terrible." Pt  reports AH/SI continue however they are "quieter" than over the weekend. Pt exhibits future-oriented thinking with plans for 2 and 3 days from now. Pt reports her older daughter continues to stay close and not leave pt alone, per safety plan. Patient able to process. Patient engaged in discussion.          Session Time: 10:00 am - 11:00 am   Participation Level: Active   Behavioral Response: CasualAlertDepressed   Type of Therapy: Group Therapy   Treatment Goals addressed: Coping   Progress Towards Goals: Progressing   Interventions: CBT, DBT, Solution Focused, Strength-based, Supportive, and Reframing   Summary: Cln facilitated processing group around difficult relationships. Group members shared struggles they face in relationships. Cln brought in topics of self-esteem, CBT thought challenging, boundaries, and communication to aid growth.   Therapist Response:  Pt engaged in discussion and is able to process.             Session Time: 11:00 -12:00   Participation Level: Active   Behavioral Response: CasualAlertDepressed   Type of Therapy: Group Therapy   Treatment Goals addressed: Coping   Progress Towards Goals: Progressing   Interventions: CBT, DBT, Solution Focused, Strength-based, Supportive, and Reframing   Summary: Cln led discussion on CBT thinking error: mind reading. Cln worked with group members to identify examples of mind reading and the consequences that can come. Group shared ways in which mind reading has been an issue for them and barriers to working on it.    Therapist Response: Pt engaged in discussion and is able to determine examples in their own life.  Session Time: 12:00 -1:00   Participation Level: Active   Behavioral Response: CasualAlertDepressed   Type of Therapy: Group therapy   Treatment Goals addressed: Coping   Progress Towards Goals: Progressing   Interventions: OT group   Summary: 12:00 - 12:50: Occupational  Therapy group with cln E. Hollan.  12:50 - 1:00 Clinician assessed for immediate needs, medication compliance and efficacy, and safety concerns.   Therapist Response: 12:00 - 12:50: See note 12:50 - 1:00 pm: At check-out, patient reports no immediate concerns. Patient demonstrates progress as evidenced by continued engagement and responsiveness to treatment. Patient reports passive SI at end of group and reiterates commitment to not act on thoughts and to follow safety plan.     Suicidal/Homicidal: Yeswithout intent/plan  Plan: Pt will continue in PHP while working to decrease depression symptoms, increase awareness and management of AH, and increase ability to manage symptoms in a healthy manner.   Collaboration of Care: Medication Management AEB T Lewis, NP  Patient/Guardian was advised Release of Information must be obtained prior to any record release in order to collaborate their care with an outside provider. Patient/Guardian was advised if they have not already done so to contact the registration department to sign all necessary forms in order for [place] to release information regarding their care.   Consent: Patient/Guardian gives verbal consent for treatment and assignment of benefits for services provided during this visit. Patient/Guardian expressed understanding and agreed to proceed.   Diagnosis: Schizoaffective disorder, unspecified type (HCC) [F25.9]    1. Schizoaffective disorder, unspecified type (HCC)      Donia Guiles, LCSW

## 2023-03-25 NOTE — Psych (Signed)
 Virtual Visit via Video Note  I connected with Lauren Chapman on 03/09/23 at  9:00 AM EST by a video enabled telemedicine application and verified that I am speaking with the correct person using two identifiers.  Location: Patient: patient home Provider: clinical home office   I discussed the limitations of evaluation and management by telemedicine and the availability of in person appointments. The patient expressed understanding and agreed to proceed.  I discussed the assessment and treatment plan with the patient. The patient was provided an opportunity to ask questions and all were answered. The patient agreed with the plan and demonstrated an understanding of the instructions.   The patient was advised to call back or seek an in-person evaluation if the symptoms worsen or if the condition fails to improve as anticipated.  Pt was provided 240 minutes of non-face-to-face time during this encounter.   Donia Guiles, LCSW   [facility] PHP THERAPIST PROGRESS NOTE  Lauren Chapman [id number]  Session Time: 9:00 - 10:00  Participation Level: Active  Behavioral Response: CasualAlertDepressed  Type of Therapy: Group Therapy  Treatment Goals addressed: Coping  Progress Towards Goals: Initial  Interventions: CBT, DBT, Supportive, and Reframing  Summary: Lauren Chapman is a 53 y.o. female who presents with depression and AH symptoms.  Clinician led check-in regarding current stressors and situation, and review of patient completed daily inventory. Clinician utilized active listening and empathetic response and validated patient emotions. Clinician facilitated processing group on pertinent issues.   Therapist Response: Patient arrived within time allowed. Patient rates her mood at an 2.5 on a scale of 1-10 with 10 being best. Pt states she feels "anxious." Pt states she slept 6 hours and ate 1x. Pt reports she still has a cold and  did complete task of going to her mom's. 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 Pt reports increased AH and that she has a "wad of people" in her ear. Pt denies command hallucinations and SI.  Patient able to process. Patient engaged in discussion.          Session Time: 10:00 am - 11:00 am   Participation Level: Active   Behavioral Response: CasualAlertDepressed   Type of Therapy: Group Therapy   Treatment Goals addressed: Coping   Progress Towards Goals: Progressing   Interventions: CBT, DBT, Solution Focused, Strength-based, Supportive, and Reframing   Summary: Cln led discussion on feelings and the role they play for our lives. Cln contextualized feelings as a warning system that brings attention to areas of our lives that need focus. Group members discussed how to pay attention to what the feeling is telling us versus reacting to unpleasant aspects of a feeling.    Therapist Response: Pt engaged in discussion and reports understanding.            Session Time: 11:00 -12:00   Participation Level: Active   Behavioral Response: CasualAlertDepressed   Type of Therapy: Group Therapy   Treatment Goals addressed: Coping   Progress Towards Goals: Progressing   Interventions: CBT, DBT, Solution Focused, Strength-based, Supportive, and Reframing   Summary: Cln continued topic of DBT distress tolerance skills. Cln introduced TIPP skills to use during cases of extreme emotion. Group reviewed temperature and intense exercise and practiced deep breathing. Group discussed which situations they can apply TIPP skills.    Therapist Response: Pt engaged in discussion and is able to state ways to apply these skills.  Session Time: 12:00 -1:00   Participation Level: Active   Behavioral Response: CasualAlertDepressed   Type of Therapy: Group therapy   Treatment Goals addressed: Coping   Progress Towards Goals: Progressing   Interventions: CBT, DBT, Solution Focused, Strength-based,  Supportive, and Reframing   Summary: 12:00 - 12:50: Cln continued topic of DBT distress tolerance skills and introduced Progressive Muscle Relaxation from the TIP skills. Cln led practice of PMR and discussed ideal ways in which the skill can be utilized to force calm into the body when escalated.  12:50 - 1:00 Clinician assessed for immediate needs, medication compliance and efficacy, and safety concerns.   Therapist Response: 12:00 - 12:50:  Pt engaged in practice and reports understanding of PMR.  12:50 - 1:00 pm: At check-out, patient reports no immediate concerns. Patient demonstrates progress as evidenced by continued engagement and responsiveness to treatment. Patient denies SI/HI/self-harm thoughts at the end of group.    Suicidal/Homicidal: Nowithout intent/plan  Plan: Pt will continue in PHP while working to decrease depression symptoms, increase awareness and management of AH, and increase ability to manage symptoms in a healthy manner.   Collaboration of Care: Medication Management AEB T Lewis, NP  Patient/Guardian was advised Release of Information must be obtained prior to any record release in order to collaborate their care with an outside provider. Patient/Guardian was advised if they have not already done so to contact the registration department to sign all necessary forms in order for [place] to release information regarding their care.   Consent: Patient/Guardian gives verbal consent for treatment and assignment of benefits for services provided during this visit. Patient/Guardian expressed understanding and agreed to proceed.   Diagnosis: Schizoaffective disorder, unspecified type (HCC) [F25.9]    1. Schizoaffective disorder, unspecified type (HCC)      Donia Guiles, LCSW

## 2023-03-25 NOTE — Psych (Signed)
 Virtual Visit via Video Note  I connected with Lauren Chapman on 03/16/23 at  9:00 AM EST by a video enabled telemedicine application and verified that I am speaking with the correct person using two identifiers.  Location: Patient: patient home Provider: clinical home office   I discussed the limitations of evaluation and management by telemedicine and the availability of in person appointments. The patient expressed understanding and agreed to proceed.  I discussed the assessment and treatment plan with the patient. The patient was provided an opportunity to ask questions and all were answered. The patient agreed with the plan and demonstrated an understanding of the instructions.   The patient was advised to call back or seek an in-person evaluation if the symptoms worsen or if the condition fails to improve as anticipated.  Pt was provided 240 minutes of non-face-to-face time during this encounter.   Donia Guiles, LCSW   [facility] PHP THERAPIST PROGRESS NOTE  Lauren Chapman [id number]  Session Time: 9:00 - 10:00  Participation Level: Active  Behavioral Response: CasualAlertDepressed  Type of Therapy: Group Therapy  Treatment Goals addressed: Coping  Progress Towards Goals: Progressing  Interventions: CBT, DBT, Supportive, and Reframing  Summary: Lauren Chapman is a 53 y.o. female who presents with depression and AH symptoms.  Clinician led check-in regarding current stressors and situation, and review of patient completed daily inventory. Clinician utilized active listening and empathetic response and validated patient emotions. Clinician facilitated processing group on pertinent issues.   Therapist Response: Patient arrived within time allowed. Patient rates her mood at a 0 on a scale of 1-10 with 10 being best. Pt states she feels "brokenhearted." Pt states she slept 3 broken hours and ate 3 bites. Pt is crying throughout session. Pt states she was told yesterday that  her daughter and grandchildren will be moving back to [place]. Pt states she doesn't know when she will see "her babies" again and this leaves her living situation unclear as pt currently lives with her daughter and daughter's family. Pt reports SI since last night and having increased AH. Pt reports she does not want to act on SI but feels uncertain whether she can mange thoughts. Pt met with provider Chilton Greathouse and RN Lubertha Basque to assess for safety and safety plan. Patient able to process. Patient engaged in discussion.          Session Time: 10:00 am - 11:00 am   Participation Level: Active   Behavioral Response: CasualAlertDepressed   Type of Therapy: Group Therapy   Treatment Goals addressed: Coping   Progress Towards Goals: Progressing   Interventions: CBT, DBT, Solution Focused, Strength-based, Supportive, and Reframing   Summary: Cln led discussion on ways to manage stressors and feelings over the weekend. Group members  brainstormed things to do over the weekend for multiple levels of energy, access, and moods. Cln reviewed crisis services should they be needed and provided pt's with the text crisis line, mobile crisis, national suicide hotline, [facility] 24/7 line, and information on [facility].      Therapist Response: Pt engaged in discussion and is able to identify 3 ideas of what to do over the weekend to keep their mind engaged.            Session Time: 11:00 -12:00   Participation Level: Active   Behavioral Response: CasualAlertDepressed   Type of Therapy: Group Therapy   Treatment Goals addressed: Coping   Progress Towards Goals: Progressing   Interventions: CBT,  DBT, Solution Focused, Strength-based, Supportive, and Reframing   Summary: Cln led discussion on CBT thinking error: over-generalization. Group discussed the impact of having unchallenged over-generalizing thoughts and how it negatively impacts perception.    Therapist Response: Pt engaged in discussion  and is able to determine examples in their own life.              Session Time: 12:00 -1:00   Participation Level: Active   Behavioral Response: CasualAlertDepressed   Type of Therapy: Group therapy   Treatment Goals addressed: Coping   Progress Towards Goals: Progressing   Interventions: OT group   Summary: 12:00 - 12:50: Occupational Therapy group with cln E. Hollan.  12:50 - 1:00 Clinician assessed for immediate needs, medication compliance and efficacy, and safety concerns.   Therapist Response: 12:00 - 12:50: See note 12:50 - 1:00 pm: At check-out, patient reports no immediate concerns. Patient demonstrates progress as evidenced by continued engagement and responsiveness to treatment. Patient reports passive SI at end of session and agrees to not act before seeking support and to follow set safety plan.     Suicidal/Homicidal: Yeswithout intent/plan  Plan: Pt will continue in PHP while working to decrease depression symptoms, increase awareness and management of AH, and increase ability to manage symptoms in a healthy manner.   Collaboration of Care: Medication Management AEB T Lewis, NP  Patient/Guardian was advised Release of Information must be obtained prior to any record release in order to collaborate their care with an outside provider. Patient/Guardian was advised if they have not already done so to contact the registration department to sign all necessary forms in order for [place] to release information regarding their care.   Consent: Patient/Guardian gives verbal consent for treatment and assignment of benefits for services provided during this visit. Patient/Guardian expressed understanding and agreed to proceed.   Diagnosis: Schizoaffective disorder, unspecified type (HCC) [F25.9]    1. Schizoaffective disorder, unspecified type (HCC)   2. Severe episode of recurrent major depressive disorder, with psychotic features (HCC)      Donia Guiles, LCSW

## 2023-03-25 NOTE — Psych (Signed)
 Virtual Visit via Video Note  I connected with Lauren Chapman on 03/19/23 at  9:00 AM EST by a video enabled telemedicine application and verified that I am speaking with the correct person using two identifiers.  Location: Patient: patient home Provider: clinical home office   I discussed the limitations of evaluation and management by telemedicine and the availability of in person appointments. The patient expressed understanding and agreed to proceed.  I discussed the assessment and treatment plan with the patient. The patient was provided an opportunity to ask questions and all were answered. The patient agreed with the plan and demonstrated an understanding of the instructions.   The patient was advised to call back or seek an in-person evaluation if the symptoms worsen or if the condition fails to improve as anticipated.  Pt was provided 240 minutes of non-face-to-face time during this encounter.   Donia Guiles, LCSW   [facility] BH PHP THERAPIST PROGRESS NOTE  Lauren Chapman [id number]  Session Time: 9:00 - 10:00  Participation Level: Active  Behavioral Response: CasualAlertDepressed  Type of Therapy: Group Therapy  Treatment Goals addressed: Coping  Progress Towards Goals: Progressing  Interventions: CBT, DBT, Supportive, and Reframing  Summary: Lauren Chapman is a 53 y.o. female who presents with depression and AH symptoms.  Clinician led check-in regarding current stressors and situation, and review of patient completed daily inventory. Clinician utilized active listening and empathetic response and validated patient emotions. Clinician facilitated processing group on pertinent issues.   Therapist Response: Patient arrived within time allowed. Patient rates her mood at a 2 on a scale of 1-10 with 10 being best. Pt states she feels "still bad." Pt states she slept 5 broken hours and ate 1x. Pt reports she continues to struggle with news of daughter's move to [place]. 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 Pt states she worked Sunday despite not having a voice from crying all weekend. Pt reports she continued to have SI throughout the weekend, however her daughter did not leave her alone, per safety plan, and pt did not act on thoughts. Pt reports watching a TV show with older daughter to distract. Pt identifies AH with SI related commands are present and "pretty bad" and that pt is able to argue some with the AH. Pt exhibits future oriented thinking talking about plans for later in the week. Pt agrees to come back to group tomorrow and commits to making no suicidal actions before then. Pt met with provider Corrie Mckusick, MD to assess for safety. Patient able to process. Patient engaged in discussion.          Session Time: 10:00 am - 11:00 am   Participation Level: Active   Behavioral Response: CasualAlertDepressed   Type of Therapy: Group Therapy   Treatment Goals addressed: Coping   Progress Towards Goals: Progressing   Interventions: CBT, DBT, Solution Focused, Strength-based, Supportive, and Reframing   Summary: Cln led processing group for pt's current struggles. Group members shared stressors and provided support and feedback. Cln brought in topics of boundaries, healthy relationships, and unhealthy thought processes to inform discussion.     Therapist Response: Pt able to process and provide support to group.            Session Time: 11:00 -12:00   Participation Level: Active   Behavioral Response: CasualAlertDepressed   Type of Therapy: Group Therapy   Treatment Goals addressed: Coping  Progress Towards Goals: Progressing   Interventions: CBT, DBT, Solution Focused, Strength-based, Supportive, and Reframing   Summary: Cln led discussion on CBT thinking error: personalization. Cln drew connection between personalizing thoughts to increased self conciousness and lower self esteem.   Group discussed struggled with personalizing and created reframing statements.    Therapist Response: Pt engaged in discussion and is able to determine examples in their own life.              Session Time: 12:00 -1:00   Participation Level: Active   Behavioral Response: CasualAlertDepressed   Type of Therapy: Group therapy   Treatment Goals addressed: Coping   Progress Towards Goals: Progressing   Interventions: OT group   Summary: 12:00 - 12:50: Occupational Therapy group with cln E. Hollan.  12:50 - 1:00 Clinician assessed for immediate needs, medication compliance and efficacy, and safety concerns.   Therapist Response: 12:00 - 12:50: See note 12:50 - 1:00 pm: At check-out, patient reports no immediate concerns. Patient demonstrates progress as evidenced by continued engagement and responsiveness to treatment. Patient reports passive SI at end of group and reiterates commitment to not act on thoughts and to follow safety plan.     Suicidal/Homicidal: Yeswithout intent/plan  Plan: Pt will continue in PHP while working to decrease depression symptoms, increase awareness and management of AH, and increase ability to manage symptoms in a healthy manner.   Collaboration of Care: Medication Management AEB T Lewis, NP  Patient/Guardian was advised Release of Information must be obtained prior to any record release in order to collaborate their care with an outside provider. Patient/Guardian was advised if they have not already done so to contact the registration department to sign all necessary forms in order for [place] to release information regarding their care.   Consent: Patient/Guardian gives verbal consent for treatment and assignment of benefits for services provided during this visit. Patient/Guardian expressed understanding and agreed to proceed.   Diagnosis: Schizoaffective disorder, unspecified type (HCC) [F25.9]    1. Schizoaffective disorder, unspecified type (HCC)    2. Severe episode of recurrent major depressive disorder, with psychotic features (HCC)      Donia Guiles, LCSW

## 2023-03-25 NOTE — Psych (Signed)
 Virtual Visit via Video Note  I connected with Lauren Chapman on 03/13/23 at  9:00 AM EST by a video enabled telemedicine application and verified that I am speaking with the correct person using two identifiers.  Location: Patient: patient home Provider: clinical home office   I discussed the limitations of evaluation and management by telemedicine and the availability of in person appointments. The patient expressed understanding and agreed to proceed.  I discussed the assessment and treatment plan with the patient. The patient was provided an opportunity to ask questions and all were answered. The patient agreed with the plan and demonstrated an understanding of the instructions.   The patient was advised to call back or seek an in-person evaluation if the symptoms worsen or if the condition fails to improve as anticipated.  Pt was provided 240 minutes of non-face-to-face time during this encounter.   Donia Guiles, LCSW   [facility] BH PHP THERAPIST PROGRESS NOTE  Lauren Chapman [id number]  Session Time: 9:00 - 10:00  Participation Level: Active  Behavioral Response: CasualAlertDepressed  Type of Therapy: Group Therapy  Treatment Goals addressed: Coping  Progress Towards Goals: Progressing  Interventions: CBT, DBT, Supportive, and Reframing  Summary: Lauren Chapman is a 53 y.o. female who presents with depression and AH symptoms.  Clinician led check-in regarding current stressors and situation, and review of patient completed daily inventory. Clinician utilized active listening and empathetic response and validated patient emotions. Clinician facilitated processing group on pertinent issues.   Therapist Response: Patient arrived within time allowed. Patient rates her mood at an 2 on a scale of 1-10 with 10 being best. Pt states she feels "not good." Pt states she slept 12 hours and ate 1x. Pt reports low mood and SI with thoughts of "I could throw myself down the stairs."  Pt reports she is able to catch the thoughts and know she doesn't want to act on them. Pt states she stayed in bed most of the weekend. Pt states missing night medication last night because of falling asleep and brainstorms how to prevent that happening in the future.  Patient able to process. Patient engaged in discussion.          Session Time: 10:00 am - 11:00 am   Participation Level: Active   Behavioral Response: CasualAlertDepressed   Type of Therapy: Group Therapy   Treatment Goals addressed: Coping   Progress Towards Goals: Progressing   Interventions: CBT, DBT, Solution Focused, Strength-based, Supportive, and Reframing   Summary: Cln led discussion on "spiraling" or when our thoughts compound on one another to descend our feelings into worse and worse situations. Group members discussed the ways in which spiraling is difficult for them and when they are especially vulnerable. Cln offered DBT distraction skills as a way to halt the spiral once you recognize it.    Therapist Response: Pt engaged in discussion and reports understanding.           Session Time: 11:00 -12:00   Participation Level: Active   Behavioral Response: CasualAlertDepressed   Type of Therapy: Group Therapy   Treatment Goals addressed: Coping   Progress Towards Goals: Progressing   Interventions: CBT, DBT, Solution Focused, Strength-based, Supportive, and Reframing   Summary: Cln introduced CBT and the way in which it can provide context for addressing stumbling blocks. Group discussed "the problem is not the problem, the problem is how we're thinking about the problem" and tried to change perspective on current struggles.    Therapist  Response: Pt engaged in discussion and is able to attempt reframing using CBT.              Session Time: 12:00 -1:00   Participation Level: Active   Behavioral Response: CasualAlertDepressed   Type of Therapy: Group therapy   Treatment Goals  addressed: Coping   Progress Towards Goals: Progressing   Interventions: OT group   Summary: 12:00 - 12:50: Occupational Therapy group with cln E. Hollan.  12:50 - 1:00 Clinician assessed for immediate needs, medication compliance and efficacy, and safety concerns.   Therapist Response: 12:00 - 12:50: See note 12:50 - 1:00 pm: At check-out, patient reports no immediate concerns. Patient demonstrates progress as evidenced by continued engagement and responsiveness to treatment. Patient denies SI/HI/self-harm thoughts at the end of group.    Suicidal/Homicidal: Nowithout intent/plan  Plan: Pt will continue in PHP while working to decrease depression symptoms, increase awareness and management of AH, and increase ability to manage symptoms in a healthy manner.   Collaboration of Care: Medication Management AEB T Lewis, NP  Patient/Guardian was advised Release of Information must be obtained prior to any record release in order to collaborate their care with an outside provider. Patient/Guardian was advised if they have not already done so to contact the registration department to sign all necessary forms in order for [place] to release information regarding their care.   Consent: Patient/Guardian gives verbal consent for treatment and assignment of benefits for services provided during this visit. Patient/Guardian expressed understanding and agreed to proceed.   Diagnosis: Schizoaffective disorder, unspecified type (HCC) [F25.9]    1. Schizoaffective disorder, unspecified type (HCC)   2. Severe episode of recurrent major depressive disorder, with psychotic features (HCC)      Donia Guiles, LCSW

## 2023-03-25 NOTE — Psych (Signed)
 Virtual Visit via Video Note  I connected with TAKELIA URIETA on 03/15/23 at  9:00 AM EST by a video enabled telemedicine application and verified that I am speaking with the correct person using two identifiers.  Location: Patient: patient home Provider: clinical home office   I discussed the limitations of evaluation and management by telemedicine and the availability of in person appointments. The patient expressed understanding and agreed to proceed.  I discussed the assessment and treatment plan with the patient. The patient was provided an opportunity to ask questions and all were answered. The patient agreed with the plan and demonstrated an understanding of the instructions.   The patient was advised to call back or seek an in-person evaluation if the symptoms worsen or if the condition fails to improve as anticipated.  Pt was provided 240 minutes of non-face-to-face time during this encounter.   Donia Guiles, LCSW   [facility] BH PHP THERAPIST PROGRESS NOTE  CHEYLA DUCHEMIN [id number]  Session Time: 9:00 - 10:00  Participation Level: Active  Behavioral Response: CasualAlertDepressed  Type of Therapy: Group Therapy  Treatment Goals addressed: Coping  Progress Towards Goals: Progressing  Interventions: CBT, DBT, Supportive, and Reframing  Summary: HAILLE PARDI is a 53 y.o. female who presents with depression and AH symptoms.  Clinician led check-in regarding current stressors and situation, and review of patient completed daily inventory. Clinician utilized active listening and empathetic response and validated patient emotions. Clinician facilitated processing group on pertinent issues.   Therapist Response: Patient arrived within time allowed. Patient rates her mood at a 5.5 on a scale of 1-10 with 10 being best. Pt states she feels "okay." Pt states she slept 5 broken hours and ate 2x. Pt reports she was unable to stay asleep and is feeling worn out. Pt reports  continued anxiety about returning to a full shift today at work. Pt states achieving goal of showering. Pt identifies AH that are not command or loud. Patient able to process. Patient engaged in discussion.          Session Time: 10:00 am - 11:00 am   Participation Level: Active   Behavioral Response: CasualAlertDepressed   Type of Therapy: Group Therapy   Treatment Goals addressed: Coping   Progress Towards Goals: Progressing   Interventions: CBT, DBT, Solution Focused, Strength-based, Supportive, and Reframing   Summary: Cln led discussion on planning ahead as a way to mitigate anxiety. Group discussed ways to focus on what you can control as a way to re-center and ground. Group brainstormed ways to plan ahead for things they are currently anxious about.    Therapist Response: Pt engaged in discussion and identified ways to plan ahead for anxieties.            Session Time: 11:00 -12:00   Participation Level: Active   Behavioral Response: CasualAlertDepressed   Type of Therapy: Group Therapy   Treatment Goals addressed: Coping   Progress Towards Goals: Progressing   Interventions: CBT, DBT, Solution Focused, Strength-based, Supportive, and Reframing   Summary: Cln introduced topic of CBT cognitive distortions. Cln discussed unhealthy thought patterns and how our thoughts shape our reality and irrational thoughts can alter our perspective.    Therapist Response: Pt engaged in discussion and is able to determine examples of distorted thinking in their own life.              Session Time: 12:00 -1:00   Participation Level: Active   Behavioral Response: CasualAlertDepressed   Type  of Therapy: Group therapy   Treatment Goals addressed: Coping   Progress Towards Goals: Progressing   Interventions: OT group   Summary: 12:00 - 12:50: Occupational Therapy group with cln E. Hollan.  12:50 - 1:00 Clinician assessed for immediate needs, medication compliance and  efficacy, and safety concerns.   Therapist Response: 12:00 - 12:50: See note 12:50 - 1:00 pm: At check-out, patient reports no immediate concerns. Patient demonstrates progress as evidenced by continued engagement and responsiveness to treatment. Patient denies SI/HI/self-harm thoughts at the end of group.    Suicidal/Homicidal: Nowithout intent/plan  Plan: Pt will continue in PHP while working to decrease depression symptoms, increase awareness and management of AH, and increase ability to manage symptoms in a healthy manner.   Collaboration of Care: Medication Management AEB T Lewis, NP  Patient/Guardian was advised Release of Information must be obtained prior to any record release in order to collaborate their care with an outside provider. Patient/Guardian was advised if they have not already done so to contact the registration department to sign all necessary forms in order for [place] to release information regarding their care.   Consent: Patient/Guardian gives verbal consent for treatment and assignment of benefits for services provided during this visit. Patient/Guardian expressed understanding and agreed to proceed.   Diagnosis: Schizoaffective disorder, unspecified type (HCC) [F25.9]    1. Schizoaffective disorder, unspecified type (HCC)      Donia Guiles, LCSW

## 2023-03-26 ENCOUNTER — Other Ambulatory Visit (HOSPITAL_COMMUNITY): Payer: 59 | Attending: Psychiatry | Admitting: Licensed Clinical Social Worker

## 2023-03-26 ENCOUNTER — Encounter (HOSPITAL_COMMUNITY): Payer: Self-pay

## 2023-03-26 ENCOUNTER — Other Ambulatory Visit (HOSPITAL_COMMUNITY): Payer: 59 | Attending: Psychiatry

## 2023-03-26 DIAGNOSIS — F333 Major depressive disorder, recurrent, severe with psychotic symptoms: Secondary | ICD-10-CM | POA: Diagnosis not present

## 2023-03-26 DIAGNOSIS — R45851 Suicidal ideations: Secondary | ICD-10-CM | POA: Diagnosis present

## 2023-03-26 DIAGNOSIS — F259 Schizoaffective disorder, unspecified: Secondary | ICD-10-CM

## 2023-03-26 DIAGNOSIS — F411 Generalized anxiety disorder: Secondary | ICD-10-CM | POA: Insufficient documentation

## 2023-03-26 DIAGNOSIS — R4589 Other symptoms and signs involving emotional state: Secondary | ICD-10-CM

## 2023-03-26 DIAGNOSIS — R4581 Low self-esteem: Secondary | ICD-10-CM | POA: Diagnosis not present

## 2023-03-26 NOTE — Psych (Signed)
 Virtual Visit via Video Note  I connected with Lauren Chapman on 03/26/23 at  9:00 AM EST by a video enabled telemedicine application and verified that I am speaking with the correct person using two identifiers.  Location: Patient: patient home Provider: clinical home office   I discussed the limitations of evaluation and management by telemedicine and the availability of in person appointments. The patient expressed understanding and agreed to proceed.  I discussed the assessment and treatment plan with the patient. The patient was provided an opportunity to ask questions and all were answered. The patient agreed with the plan and demonstrated an understanding of the instructions.   The patient was advised to call back or seek an in-person evaluation if the symptoms worsen or if the condition fails to improve as anticipated.  Pt was provided 240 minutes of non-face-to-face time during this encounter.   Donia Guiles, LCSW   [facility] BH PHP THERAPIST PROGRESS NOTE  Lauren Chapman [id number]  Session Time: 9:00 - 10:00  Participation Level: Active  Behavioral Response: CasualAlertDepressed  Type of Therapy: Group Therapy  Treatment Goals addressed: Coping  Progress Towards Goals: Progressing  Interventions: CBT, DBT, Supportive, and Reframing  Summary: Lauren Chapman is a 53 y.o. female who presents with depression and AH symptoms.  Clinician led check-in regarding current stressors and situation, and review of patient completed daily inventory. Clinician utilized active listening and empathetic response and validated patient emotions. Clinician facilitated processing group on pertinent issues.   Therapist Response: Patient arrived within time allowed. Patient rates her mood at a 5.5 on a scale of 1-10 with 10 being best. Pt states she feels "anxious." Pt states she slept 12 hours and ate 1x. Pt reports she woke up in a panic thinking about her lockbox of important papers and  now can't find it. Pt reports she is still feeling dysregulated from waking up with that jolt. Pt reports her weekend went well and she spent time with family and worked. Pt states she began vaping over the weekend. Pt reports she is aware of the risks and wants to. Pt states AH and passive SI are present but "very quiet" and she feels in control.Patient able to process. Patient engaged in discussion.          Session Time: 10:00 am - 11:00 am   Participation Level: Active   Behavioral Response: CasualAlertDepressed   Type of Therapy: Group Therapy   Treatment Goals addressed: Coping   Progress Towards Goals: Progressing   Interventions: CBT, DBT, Solution Focused, Strength-based, Supportive, and Reframing   Summary: Cln led processing group for pt's current struggles. Group members shared stressors and provided support and feedback. Cln brought in topics of boundaries, healthy relationships, and unhealthy thought processes to inform discussion.    Therapist Response: Pt able to process and provide support to group.           Session Time: 11:00 -12:00   Participation Level: Active   Behavioral Response: CasualAlertDepressed   Type of Therapy: Group Therapy   Treatment Goals addressed: Coping   Progress Towards Goals: Progressing   Interventions: CBT, DBT, Solution Focused, Strength-based, Supportive, and Reframing   Summary: Cln introduced topic of boundaries. Cln discussed how boundaries inform our relationships and affect self-esteem and personal agency. Group discussed the three types of boundaries: rigid, porous, and healthy and when each type is most helpful/harmful.    Therapist Response: Pt engaged in discussion and is able to identify each type in  their life             Session Time: 12:00 -1:00   Participation Level: Active   Behavioral Response: CasualAlertDepressed   Type of Therapy: Group therapy   Treatment Goals addressed: Coping   Progress  Towards Goals: Progressing   Interventions: OT group   Summary: 12:00 - 12:50: Occupational Therapy group with cln E. Hollan.  12:50 - 1:00 Clinician assessed for immediate needs, medication compliance and efficacy, and safety concerns.   Therapist Response: 12:00 - 12:50: See note 12:50 - 1:00 pm: At check-out, patient reports no immediate concerns. Patient demonstrates progress as evidenced by continued engagement and responsiveness to treatment. Patient denies SI/HI at end of group.     Suicidal/Homicidal: Nowithout intent/plan  Plan: Pt will continue in PHP while working to decrease depression symptoms, increase awareness and management of AH, and increase ability to manage symptoms in a healthy manner.   Collaboration of Care: Medication Management AEB T Lewis, NP  Patient/Guardian was advised Release of Information must be obtained prior to any record release in order to collaborate their care with an outside provider. Patient/Guardian was advised if they have not already done so to contact the registration department to sign all necessary forms in order for [place] to release information regarding their care.   Consent: Patient/Guardian gives verbal consent for treatment and assignment of benefits for services provided during this visit. Patient/Guardian expressed understanding and agreed to proceed.   Diagnosis: Schizoaffective disorder, unspecified type (HCC) [F25.9]    1. Schizoaffective disorder, unspecified type (HCC)      Donia Guiles, LCSW

## 2023-03-26 NOTE — Psych (Signed)
 Virtual Visit via Video Note  I connected with Lauren Chapman on 03/23/23 at  9:00 AM EST by a video enabled telemedicine application and verified that I am speaking with the correct person using two identifiers.  Location: Patient: patient home Provider: clinical home office   I discussed the limitations of evaluation and management by telemedicine and the availability of in person appointments. The patient expressed understanding and agreed to proceed.  I discussed the assessment and treatment plan with the patient. The patient was provided an opportunity to ask questions and all were answered. The patient agreed with the plan and demonstrated an understanding of the instructions.   The patient was advised to call back or seek an in-person evaluation if the symptoms worsen or if the condition fails to improve as anticipated.  Pt was provided 240 minutes of non-face-to-face time during this encounter.   Donia Guiles, LCSW   [facility] BH PHP THERAPIST PROGRESS NOTE  Lauren Chapman [id number]  Session Time: 9:00 - 10:00  Participation Level: Active  Behavioral Response: CasualAlertDepressed  Type of Therapy: Group Therapy  Treatment Goals addressed: Coping  Progress Towards Goals: Progressing  Interventions: CBT, DBT, Supportive, and Reframing  Summary: Lauren Chapman is a 53 y.o. female who presents with depression and AH symptoms.  Clinician led check-in regarding current stressors and situation, and review of patient completed daily inventory. Clinician utilized active listening and empathetic response and validated patient emotions. Clinician facilitated processing group on pertinent issues.   Therapist Response: Patient arrived within time allowed. Patient rates her mood at a 5 on a scale of 1-10 with 10 being best. Pt states she feels "a little foggy." Pt states she slept 10 hours and ate 1x. Pt reports she worked last night and the activity helped distract her. Pt  reports she did not cry yesterday. Pt states AH and passive SI are present however she feels more capable to manage the thoughts. Patient able to process. Patient engaged in discussion.          Session Time: 10:00 am - 11:00 am   Participation Level: Active   Behavioral Response: CasualAlertDepressed   Type of Therapy: Group Therapy   Treatment Goals addressed: Coping   Progress Towards Goals: Progressing   Interventions: CBT, DBT, Solution Focused, Strength-based, Supportive, and Reframing   Summary: Cln led discussion on change. Group members shared ways in which they struggle with change and what makes it difficult. Cln brought in DBT radical acceptance and CBT thought challenging as a way to manage issues with change.    Therapist Response: Pt engaged in discussion.           Session Time: 11:00 -12:00   Participation Level: Active   Behavioral Response: CasualAlertDepressed   Type of Therapy: Group Therapy   Treatment Goals addressed: Coping   Progress Towards Goals: Progressing   Interventions: CBT, DBT, Solution Focused, Strength-based, Supportive, and Reframing   Summary: Cln continued topic of CBT cognitive distortions and introduced thought challenging as a way to  utilize the "challenge" C in C-C-C. Group utilized Administrator, Civil [employer] questions" as a way to introduce challenges and reframe distorted thinking. Group members worked through pt examples to practice challenging distorted thinking.    Therapist Response:  Pt engaged in discussion and demonstrates understanding of challenging distorted thoughts through practice.              Session Time: 12:00 -1:00   Participation Level: Active   Behavioral Response: CasualAlertDepressed  Type of Therapy: Group therapy   Treatment Goals addressed: Coping   Progress Towards Goals: Progressing   Interventions: OT group   Summary: 12:00 - 12:50: Occupational Therapy group with cln E. Hollan.  12:50 -  1:00 Clinician assessed for immediate needs, medication compliance and efficacy, and safety concerns.   Therapist Response: 12:00 - 12:50: See note 12:50 - 1:00 pm: At check-out, patient reports no immediate concerns. Patient demonstrates progress as evidenced by continued engagement and responsiveness to treatment. Patient denies SI/HI at end of group.     Suicidal/Homicidal: Nowithout intent/plan  Plan: Pt will continue in PHP while working to decrease depression symptoms, increase awareness and management of AH, and increase ability to manage symptoms in a healthy manner.   Collaboration of Care: Medication Management AEB T Lewis, NP  Patient/Guardian was advised Release of Information must be obtained prior to any record release in order to collaborate their care with an outside provider. Patient/Guardian was advised if they have not already done so to contact the registration department to sign all necessary forms in order for [place] to release information regarding their care.   Consent: Patient/Guardian gives verbal consent for treatment and assignment of benefits for services provided during this visit. Patient/Guardian expressed understanding and agreed to proceed.   Diagnosis: Schizoaffective disorder, unspecified type (HCC) [F25.9]    1. Schizoaffective disorder, unspecified type (HCC)   2. Severe episode of recurrent major depressive disorder, with psychotic features (HCC)      Donia Guiles, LCSW

## 2023-03-26 NOTE — Therapy (Signed)
 [facility] PARTIAL HOSPITALIZATION PROGRAM [address] [place], [place], [address] Phone: [phone]   Fax:  [phone]  Occupational Therapy Treatment Virtual Visit via Video Note  I connected with Lauren Chapman on 03/26/23 at  8:00 AM EST by a video enabled telemedicine application and verified that I am speaking with the correct person using two identifiers.  Location: Patient: home Provider: office   I discussed the limitations of evaluation and management by telemedicine and the availability of in person appointments. The patient expressed understanding and agreed to proceed.    The patient was advised to call back or seek an in-person evaluation if the symptoms worsen or if the condition fails to improve as anticipated.  I provided 55 minutes of non-face-to-face time during this encounter.   Patient Details  Name: Lauren Chapman MRN: [id number] Date of Birth: April 13, 1970 No data recorded  Encounter Date: 03/23/2023   OT End of Session - 03/26/23 1126     Visit Number 12    Number of Visits 20    Date for OT Re-Evaluation 04/11/23    OT Start Time 1200    OT Stop Time 1255    OT Time Calculation (min) 55 min             Past Medical History:  Diagnosis Date   Allergy    Anxiety    Bipolar affective psychosis (HCC)    Complication of anesthesia    " Acting silly- waving at everyone"-Giddy   Depression    Emphysema lung (HCC)    inhaler prn,  followed by pcp and pulmonologist-- dr [name]   Family history of adverse reaction to anesthesia    Father had rheumatic fever, and died on operating table.   GERD (gastroesophageal reflux disease)    Kidney cysts 01/24/2020   Right Kidney   Migraine    Moderate persistent asthma 09/05/2021   Smokers' cough (HCC)    per pt not productive   SUI (stress urinary incontinence, female)    Tubular adenoma of colon 07/08/2020   Wears glasses     Past Surgical History:  Procedure  Laterality Date   ABDOMINAL HYSTERECTOMY N/A    [employer]reesia 11/30/2019   CARPAL TUNNEL RELEASE Right 09-12-2007   @[facility]    and GANGLION CYST EXCISION   COLONOSCOPY     CYSTOSCOPY N/A 04/29/2019   Procedure: CYSTOSCOPY;  Surgeon: [name], MD;  Location: [facility];  Service: Gynecology;  Laterality: N/A;   DILATATION & CURETTAGE/HYSTEROSCOPY WITH MYOSURE N/A 11/13/2018   Procedure: DILATATION & CURETTAGE/HYSTEROSCOPY WITH MYOSURE;  Surgeon: [name], MD;  Location: [facility];  Service: Gynecology;  Laterality: N/A;  request 9:00am OR start time in [place] [facility] block requests one hour   DILATION AND CURETTAGE OF UTERUS  02/2019   EYE SURGERY N/A    [employer]reesia 11/30/2019   GANGLION CYST EXCISION     R wrist   interstem      implant for overactive bladder   KNEE ARTHROSCOPY WITH ANTERIOR CRUCIATE LIGAMENT (ACL) REPAIR Right 10/2015   LAPAROSCOPIC CHOLECYSTECTOMY  1997   LAPAROSCOPIC HYSTERECTOMY Bilateral 04/29/2019   Procedure: HYSTERECTOMY TOTAL LAPAROSCOPIC, BILATERAL SALPINO-OOPHORECTOMY;  Surgeon: [name], MD;  Location: [facility];  Service: Gynecology;  Laterality: Bilateral;   LAPAROSCOPIC TOTAL HYSTERECTOMY     TOOTH EXTRACTION  04/01/2019   tooth removal Bilateral 2022   4 teeth removed   UPPER GASTROINTESTINAL ENDOSCOPY  There were no vitals filed for this visit.   Subjective Assessment - 03/26/23 1126     Currently in Pain? No/denies    Pain Score 0-No pain                 Group Session:  S: Doing okay today.   O: In this communication group therapy session, facilitated by an occupational therapist, participants explored several key subtopics aimed at enhancing their interpersonal skills. The session began with a discussion on the use of "I" and "AND" statements, emphasizing personal responsibility and constructive language in expressing feelings and needs. Participants then  practiced active listening techniques to improve their ability to fully understand and respond to others. The group also delved into assertive communication, learning how to express themselves confidently and respectfully. Emotional regulation skills were addressed, providing strategies for managing emotions during interactions. Social skills training included role-playing scenarios to build confidence in various social contexts. Feedback and reflection were integral parts of the session, with participants offering and receiving constructive feedback to foster personal growth. The group identified common barriers to effective communication, such as anxiety, fear of judgment, and past negative experiences, and discussed strategies to overcome these challenges, including mindfulness practices and building a supportive network.   A: The patient actively participated in the group therapy session, demonstrating a high level of engagement and enthusiasm. They contributed to discussions on "I" and "AND" statements by sharing personal examples and insights. During the active listening exercises, the patient was attentive and provided thoughtful feedback to peers. They effectively practiced assertive communication techniques and showed a clear understanding of emotional regulation strategies. In social skills role-playing, the patient was confident and responsive, indicating a good grasp of the concepts. The patient also engaged in the feedback and reflection segment, offering constructive comments and showing receptivity to feedback from others. Their proactive approach and willingness to explore personal barriers to communication suggest significant progress and motivation to improve interpersonal skills.    P: Continue to attend PHP OT group sessions 5x week for 4 weeks to promote daily structure, social engagement, and opportunities to develop and utilize adaptive strategies to maximize functional performance in  preparation for safe transition and integration back into school, work, and the community. Plan to address topic of pt 3 in next OT group session.                  OT Education - 03/26/23 1126     Education Details Communication 2              OT Short Term Goals - 03/12/23 1025       OT SHORT TERM GOAL #1   Title Patient will be educated on strategies to improve psychosocial skills needed to participate fully in all daily, work, and leisure activities.    Time 4    Period Weeks    Status On-going    Target Date 04/11/23      OT SHORT TERM GOAL #2   Title Pt will apply psychosocial skills and coping mechanisms to daily activities in order to function independently and reintegrate into community dwelling    Status On-going      OT SHORT TERM GOAL #3   Title Pt will choose and/or engage in 1-3 socially engaging leisure activities to improve social participation skills upon reintegrating into community    Status On-going  Plan - 03/26/23 1126     Psychosocial Skills Habits;Routines and Behaviors;Interpersonal Interaction;Coping Strategies             Patient will benefit from skilled therapeutic intervention in order to improve the following deficits and impairments:       Psychosocial Skills: Habits, Routines and Behaviors, Interpersonal Interaction, Coping Strategies   Visit Diagnosis: Difficulty coping    Problem List Patient Active Problem List   Diagnosis Date Noted   Schizoaffective disorder (HCC) 03/01/2023   Poorly controlled mild persistent asthma 09/05/2021   Syncope 08/02/2021   Tobacco abuse 08/02/2021   Vaginal candidiasis 08/02/2021   CAP (community acquired pneumonia) 07/18/2021   OSA (obstructive sleep apnea) 07/18/2021   Hypokalemia 07/18/2021   Depression 03/24/2020   Bilateral low back pain with left-sided sciatica 03/24/2020   Left lumbar radiculopathy 02/25/2020   Absence of bladder  continence 02/25/2020   History of postmenopausal bleeding 04/29/2019   Polypharmacy 04/03/2019   Emphysema, unspecified (HCC) 05/17/2018   Weakness 01/25/2017   Chronic migraine w/o aura w/o status migrainosus, not intractable 12/22/2014   Encounter for other general counseling or advice on contraception 09/02/2014   Bipolar disorder (HCC) 10/12/2011   Anxiety 10/12/2011   GERD (gastroesophageal reflux disease) 10/12/2011    Ted Mcalpine, OT 03/26/2023, 11:27 AM  Kerrin Champagne, OT   [facility] HOSPITALIZATION PROGRAM [address] [place], [place], [address] Phone: [phone]   Fax:  [phone]  Name: TYMESHIA AWAN MRN: [id number] Date of Birth: 12-Sep-1970

## 2023-03-26 NOTE — Psych (Signed)
 Virtual Visit via Video Note  I connected with Lauren Chapman on 03/22/23 at  9:00 AM EST by a video enabled telemedicine application and verified that I am speaking with the correct person using two identifiers.  Location: Patient: patient home Provider: clinical home office   I discussed the limitations of evaluation and management by telemedicine and the availability of in person appointments. The patient expressed understanding and agreed to proceed.  I discussed the assessment and treatment plan with the patient. The patient was provided an opportunity to ask questions and all were answered. The patient agreed with the plan and demonstrated an understanding of the instructions.   The patient was advised to call back or seek an in-person evaluation if the symptoms worsen or if the condition fails to improve as anticipated.  Pt was provided 240 minutes of non-face-to-face time during this encounter.   Donia Guiles, LCSW   [facility] BH PHP THERAPIST PROGRESS NOTE  Lauren Chapman [id number]  Session Time: 9:00 - 10:00  Participation Level: Active  Behavioral Response: CasualAlertDepressed  Type of Therapy: Group Therapy  Treatment Goals addressed: Coping  Progress Towards Goals: Progressing  Interventions: CBT, DBT, Supportive, and Reframing  Summary: Lauren Chapman is a 53 y.o. female who presents with depression and AH symptoms.  Clinician led check-in regarding current stressors and situation, and review of patient completed daily inventory. Clinician utilized active listening and empathetic response and validated patient emotions. Clinician facilitated processing group on pertinent issues.   Therapist Response: Patient arrived within time allowed. Patient rates her mood at a 5.5 on a scale of 1-10 with 10 being best. Pt states she feels "a little better." Pt states she slept 5 hours and ate 1x. Pt reports her psychiatrist made medication adjustments and pt is "already  feeling better." Pt states she hasn't cried today and her AH/SI are less intrusive, quieter, and she feels more in control. Pt discusses upcoming changes with more logical thought and calm demeanor. Patient able to process. Patient engaged in discussion.          Session Time: 10:00 am - 11:00 am   Participation Level: Active   Behavioral Response: CasualAlertDepressed   Type of Therapy: Group Therapy   Treatment Goals addressed: Coping   Progress Towards Goals: Progressing   Interventions: CBT, DBT, Solution Focused, Strength-based, Supportive, and Reframing   Summary: Cln led discussion on procrastination. Cln encouraged pt's to consider the feeling that is feeding procrastination and address it as a way of alleviating desire to procrastinate. Cln applied CBT thought challenging and DBT distress tolerance skills to aid discussion.    Therapist Response: Pt engaged in discussion and identifies the root feeling of their procrastination.           Session Time: 11:00 -12:00   Participation Level: Active   Behavioral Response: CasualAlertDepressed   Type of Therapy: Group Therapy   Treatment Goals addressed: Coping   Progress Towards Goals: Progressing   Interventions: CBT, DBT, Solution Focused, Strength-based, Supportive, and Reframing   Summary: Cln continued topic of CBT cognitive distortions. Cln utilized handout "cognitive distortions" to discuss common examples of distorted thoughts and group members worked to identify examples in their own life.    Therapist Response: Pt engaged in discussion and is able to identify which distortions are most problematic.              Session Time: 12:00 -1:00   Participation Level: Active   Behavioral Response: CasualAlertDepressed  Type of Therapy: Group therapy   Treatment Goals addressed: Coping   Progress Towards Goals: Progressing   Interventions: OT group   Summary: 12:00 - 12:50: Occupational Therapy group  with cln E. Hollan.  12:50 - 1:00 Clinician assessed for immediate needs, medication compliance and efficacy, and safety concerns.   Therapist Response: 12:00 - 12:50: See note 12:50 - 1:00 pm: At check-out, patient reports no immediate concerns. Patient demonstrates progress as evidenced by continued engagement and responsiveness to treatment. Patient denies SI/HI at end of group.     Suicidal/Homicidal: Nowithout intent/plan  Plan: Pt will continue in PHP while working to decrease depression symptoms, increase awareness and management of AH, and increase ability to manage symptoms in a healthy manner.   Collaboration of Care: Medication Management AEB T Lewis, NP  Patient/Guardian was advised Release of Information must be obtained prior to any record release in order to collaborate their care with an outside provider. Patient/Guardian was advised if they have not already done so to contact the registration department to sign all necessary forms in order for [place] to release information regarding their care.   Consent: Patient/Guardian gives verbal consent for treatment and assignment of benefits for services provided during this visit. Patient/Guardian expressed understanding and agreed to proceed.   Diagnosis: Schizoaffective disorder, unspecified type (HCC) [F25.9]    1. Schizoaffective disorder, unspecified type (HCC)      Donia Guiles, LCSW

## 2023-03-27 ENCOUNTER — Other Ambulatory Visit (HOSPITAL_COMMUNITY): Admitting: Licensed Clinical Social Worker

## 2023-03-27 ENCOUNTER — Encounter (HOSPITAL_COMMUNITY): Payer: Self-pay

## 2023-03-27 ENCOUNTER — Other Ambulatory Visit (HOSPITAL_COMMUNITY)

## 2023-03-27 ENCOUNTER — Ambulatory Visit: Payer: 59

## 2023-03-27 DIAGNOSIS — F259 Schizoaffective disorder, unspecified: Secondary | ICD-10-CM

## 2023-03-27 DIAGNOSIS — F333 Major depressive disorder, recurrent, severe with psychotic symptoms: Secondary | ICD-10-CM | POA: Diagnosis not present

## 2023-03-27 DIAGNOSIS — R4589 Other symptoms and signs involving emotional state: Secondary | ICD-10-CM

## 2023-03-27 NOTE — Therapy (Signed)
 [facility] PARTIAL HOSPITALIZATION PROGRAM [address] [place], [place], [address] Phone: [phone]   Fax:  [phone]  Occupational Therapy Treatment Virtual Visit via Video Note  I connected with Lauren Chapman on 03/27/23 at  8:00 AM EST by a video enabled telemedicine application and verified that I am speaking with the correct person using two identifiers.  Location: Patient: home Provider: office   I discussed the limitations of evaluation and management by telemedicine and the availability of in person appointments. The patient expressed understanding and agreed to proceed.    The patient was advised to call back or seek an in-person evaluation if the symptoms worsen or if the condition fails to improve as anticipated.  I provided 55 minutes of non-face-to-face time during this encounter.   Patient Details  Name: Lauren Chapman MRN: [id number] Date of Birth: Aug 25, 1970 No data recorded  Encounter Date: 03/26/2023   OT End of Session - 03/27/23 1106     Visit Number 13    Number of Visits 20    Date for OT Re-Evaluation 04/11/23    OT Start Time 1200    OT Stop Time 1255    OT Time Calculation (min) 55 min             Past Medical History:  Diagnosis Date   Allergy    Anxiety    Bipolar affective psychosis (HCC)    Complication of anesthesia    " Acting silly- waving at everyone"-Giddy   Depression    Emphysema lung (HCC)    inhaler prn,  followed by pcp and pulmonologist-- dr [name]   Family history of adverse reaction to anesthesia    Father had rheumatic fever, and died on operating table.   GERD (gastroesophageal reflux disease)    Kidney cysts 01/24/2020   Right Kidney   Migraine    Moderate persistent asthma 09/05/2021   Smokers' cough (HCC)    per pt not productive   SUI (stress urinary incontinence, female)    Tubular adenoma of colon 07/08/2020   Wears glasses     Past Surgical History:  Procedure  Laterality Date   ABDOMINAL HYSTERECTOMY N/A    Phreesia 11/30/2019   CARPAL TUNNEL RELEASE Right 09-12-2007   @[facility]    and GANGLION CYST EXCISION   COLONOSCOPY     CYSTOSCOPY N/A 04/29/2019   Procedure: CYSTOSCOPY;  Surgeon: [name], MD;  Location[facility];  Service: Gynecology;  Laterality: N/A;   DILATATION & CURETTAGE/HYSTEROSCOPY WITH MYOSURE N/A 11/13/2018   Procedure: DILATATION & CURETTAGE/HYSTEROSCOPY WITH MYOSURE;  Surgeon: [name], MD;  Location: [facility];  Service: Gynecology;  Laterality: N/A;  request 9:00am OR start time in [place] [facility] block requests one hour   DILATION AND CURETTAGE OF UTERUS  02/2019   EYE SURGERY N/A    Phreesia 11/30/2019   GANGLION CYST EXCISION     R wrist   interstem      implant for overactive bladder   KNEE ARTHROSCOPY WITH ANTERIOR CRUCIATE LIGAMENT (ACL) REPAIR Right 10/2015   LAPAROSCOPIC CHOLECYSTECTOMY  1997   LAPAROSCOPIC HYSTERECTOMY Bilateral 04/29/2019   Procedure: HYSTERECTOMY TOTAL LAPAROSCOPIC, BILATERAL SALPINO-OOPHORECTOMY;  Surgeon: [name], MD;  Location: [facility];  Service: Gynecology;  Laterality: Bilateral;   LAPAROSCOPIC TOTAL HYSTERECTOMY     TOOTH EXTRACTION  04/01/2019   tooth removal Bilateral 2022   4 teeth removed   UPPER GASTROINTESTINAL ENDOSCOPY  There were no vitals filed for this visit.   Subjective Assessment - 03/27/23 1105     Currently in Pain? No/denies    Pain Score 0-No pain                 Group Session:  S: Doing better today.   O: In this communication group therapy session, facilitated by an occupational therapist, participants explored several key subtopics aimed at enhancing their interpersonal skills. The session began with a discussion on the use of "I" and "AND" statements, emphasizing personal responsibility and constructive language in expressing feelings and needs. Participants then  practiced active listening techniques to improve their ability to fully understand and respond to others. The group also delved into assertive communication, learning how to express themselves confidently and respectfully. Emotional regulation skills were addressed, providing strategies for managing emotions during interactions. Social skills training included role-playing scenarios to build confidence in various social contexts. Feedback and reflection were integral parts of the session, with participants offering and receiving constructive feedback to foster personal growth. The group identified common barriers to effective communication, such as anxiety, fear of judgment, and past negative experiences, and discussed strategies to overcome these challenges, including mindfulness practices and building a supportive network.   A: The patient actively participated in the group therapy session, demonstrating a high level of engagement and enthusiasm. They contributed to discussions on "I" and "AND" statements by sharing personal examples and insights. During the active listening exercises, the patient was attentive and provided thoughtful feedback to peers. They effectively practiced assertive communication techniques and showed a clear understanding of emotional regulation strategies. In social skills role-playing, the patient was confident and responsive, indicating a good grasp of the concepts. The patient also engaged in the feedback and reflection segment, offering constructive comments and showing receptivity to feedback from others. Their proactive approach and willingness to explore personal barriers to communication suggest significant progress and motivation to improve interpersonal skills.    P: Continue to attend PHP OT group sessions 5x week for 4 weeks to promote daily structure, social engagement, and opportunities to develop and utilize adaptive strategies to maximize functional performance in  preparation for safe transition and integration back into school, work, and the community. Plan to address topic of tbd in next OT group session.                  OT Education - 03/27/23 1106     Education Details Communication 3              OT Short Term Goals - 03/12/23 1025       OT SHORT TERM GOAL #1   Title Patient will be educated on strategies to improve psychosocial skills needed to participate fully in all daily, work, and leisure activities.    Time 4    Period Weeks    Status On-going    Target Date 04/11/23      OT SHORT TERM GOAL #2   Title Pt will apply psychosocial skills and coping mechanisms to daily activities in order to function independently and reintegrate into community dwelling    Status On-going      OT SHORT TERM GOAL #3   Title Pt will choose and/or engage in 1-3 socially engaging leisure activities to improve social participation skills upon reintegrating into community    Status On-going  Plan - 03/27/23 1106     Psychosocial Skills Habits;Routines and Behaviors;Interpersonal Interaction;Coping Strategies             Patient will benefit from skilled therapeutic intervention in order to improve the following deficits and impairments:       Psychosocial Skills: Habits, Routines and Behaviors, Interpersonal Interaction, Coping Strategies   Visit Diagnosis: Difficulty coping    Problem List Patient Active Problem List   Diagnosis Date Noted   Schizoaffective disorder (HCC) 03/01/2023   Poorly controlled mild persistent asthma 09/05/2021   Syncope 08/02/2021   Tobacco abuse 08/02/2021   Vaginal candidiasis 08/02/2021   CAP (community acquired pneumonia) 07/18/2021   OSA (obstructive sleep apnea) 07/18/2021   Hypokalemia 07/18/2021   Depression 03/24/2020   Bilateral low back pain with left-sided sciatica 03/24/2020   Left lumbar radiculopathy 02/25/2020   Absence of bladder  continence 02/25/2020   History of postmenopausal bleeding 04/29/2019   Polypharmacy 04/03/2019   Emphysema, unspecified (HCC) 05/17/2018   Weakness 01/25/2017   Chronic migraine w/o aura w/o status migrainosus, not intractable 12/22/2014   Encounter for other general counseling or advice on contraception 09/02/2014   Bipolar disorder (HCC) 10/12/2011   Anxiety 10/12/2011   GERD (gastroesophageal reflux disease) 10/12/2011    Ted Mcalpine, OT 03/27/2023, 11:06 AM  Kerrin Champagne, OT   [facility] HOSPITALIZATION PROGRAM [address] [place], [place], [address] Phone: [phone]   Fax:  [phone]  Name: Lauren Chapman MRN: [id number] Date of Birth: 02-28-1970
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
## 2023-03-28 ENCOUNTER — Ambulatory Visit: Payer: 59

## 2023-03-28 ENCOUNTER — Other Ambulatory Visit (HOSPITAL_COMMUNITY)

## 2023-03-28 ENCOUNTER — Other Ambulatory Visit (HOSPITAL_COMMUNITY): Admitting: Licensed Clinical Social Worker

## 2023-03-28 ENCOUNTER — Telehealth (HOSPITAL_COMMUNITY): Payer: Self-pay | Admitting: Psychiatry

## 2023-03-28 VITALS — BP 113/63 | HR 67 | Temp 97.9°F | Resp 18 | Ht 63.0 in | Wt 193.6 lb

## 2023-03-28 DIAGNOSIS — F333 Major depressive disorder, recurrent, severe with psychotic symptoms: Secondary | ICD-10-CM

## 2023-03-28 DIAGNOSIS — G43709 Chronic migraine without aura, not intractable, without status migrainosus: Secondary | ICD-10-CM | POA: Diagnosis not present

## 2023-03-28 DIAGNOSIS — R4589 Other symptoms and signs involving emotional state: Secondary | ICD-10-CM

## 2023-03-28 DIAGNOSIS — F259 Schizoaffective disorder, unspecified: Secondary | ICD-10-CM

## 2023-03-28 MED ORDER — SODIUM CHLORIDE 0.9 % IV SOLN
300.0000 mg | Freq: Once | INTRAVENOUS | Status: AC
  Administered 2023-03-28: 300 mg via INTRAVENOUS
  Filled 2023-03-28: qty 3

## 2023-03-28 NOTE — Progress Notes (Addendum)
 Virtual Visit via Video Note  I connected with Lauren Chapman on 03/28/23 at  9:00 AM EST by a video enabled telemedicine application and verified that I am speaking with the correct person using two identifiers.  Location: Patient: Home  Provider: Office   I discussed the limitations of evaluation and management by telemedicine and the availability of in person appointments. The patient expressed understanding and agreed to proceed.   I discussed the assessment and treatment plan with the patient. The patient was provided an opportunity to ask questions and all were answered. The patient agreed with the plan and demonstrated an understanding of the instructions.   The patient was advised to call back or seek an in-person evaluation if the symptoms worsen or if the condition fails to improve as anticipated.   Park Pope, MD  Psych Resident, PGY-3 [facility] Partial Hospitalization Program Psych Discharge Summary  Lauren Chapman [id number]  Admission date: 03/06/23 Discharge date: 03/28/2023  Reason for admission: Lauren Chapman is a 53 y.o. female history of migraines, emphysema, OSA, GERD, bipolar disorder versus schizoaffective disorder presenting to for hospitalization program as a stepdown from a 4-day hospitalization stay due to SI.  Progress in Program Toward Treatment Goals: Progressing  Progress (rationale): Denies SI, depression improved, anxiety less  Risk Assessment: A suicide and violence risk assessment was performed as part of this evaluation. There patient is deemed to be at chronic elevated risk for self-harm/suicide given the following factors: suicidal ideation or threats without a plan and feelings of hopelessness. These risk factors are mitigated by the following factors: no history of violence, motivation for treatment, utilization of positive coping skills, supportive family, sense of responsibility to family and social supports, presence of a significant  relationship, presence of an available support system, expresses purpose for living, effective problem solving skills, support system in agreement with treatment recommendations, and presence of a safety plan with follow-up care. The patient is deemed to be at chronic elevated risk for violence given the following factors: N/A. These risk factors are mitigated by the following factors: no known history of violence towards others, no known history of threats of harm towards others, and no command hallucinations to harm others in the last 6 months. There is no acute risk for suicide or violence at this time. The patient was educated about relevant modifiable risk factors including following recommendations for treatment of psychiatric illness and abstaining from substance abuse.    While future psychiatric events cannot be accurately predicted, the patient does not currently require  acute inpatient psychiatric care and does not currently meet [facility] involuntary commitment criteria.   ROS   LMP 05/02/2019 Comment: irregular cycles  Psychiatric Specialty Exam: General Appearance: Casual, faily groomed  Eye Contact:  Good    Speech:  Clear, coherent, normal rate   Volume:  Normal   Mood:  "better"  Affect:  Appropriate, congruent, full range  Thought Content: Logical, rumination  Suicidal Thoughts: Denied active and passive SI    Thought Process:  Coherent, goal-directed, linear   Orientation:  A&Ox4   Memory:  Immediate good  Judgment:  Fair   Insight:  Fair  Concentration:  Attention and concentration good   Recall:  Good  Fund of Knowledge: Good  Language: Good, fluent  Psychomotor Activity: grossly normal   Akathisia:  NA   AIMS (if indicated): NA   Assets:  Communication Skills Desire for Improvement Financial Resources/Insurance Housing Leisure Time Physical Health Resilience Social Support  Talents/Skills  ADL's:  Intact  Cognition: WNL  Sleep:  fair        Discharge Plan: Referral to Behavioral Health Intensive Outpatient Program  Patient to stepdown to IOP starting next Monday. Patient will continue current medications as prescribed.  Patient/Guardian was advised Release of Information must be obtained prior to any record release in order to collaborate their care with an outside provider. Patient/Guardian was advised if they have not already done so to contact the registration department to sign all necessary forms in order for [place] to release information regarding their care.   Consent: Patient/Guardian gives verbal consent for treatment and assignment of benefits for services provided during this visit. Patient/Guardian expressed understanding and agreed to proceed.   Park Pope, MD Psych Resident, PGY-3 03/28/2023

## 2023-03-28 NOTE — Progress Notes (Signed)
 Diagnosis: Chronic Migraine  Provider:  Chilton Greathouse MD  Procedure: IV Infusion  IV Type: Peripheral, IV Location: L Forearm  Vyepti (Eptinezumab-jjmr), Dose: 300 mg  Infusion Start Time: 1436  Infusion Stop Time: 1509  Post Infusion IV Care: Peripheral IV Discontinued  Discharge: Condition: Good, Destination: Home . AVS Provided  Performed by:  Garnette Czech, RN

## 2023-03-29 ENCOUNTER — Other Ambulatory Visit (HOSPITAL_COMMUNITY)

## 2023-03-29 ENCOUNTER — Other Ambulatory Visit (HOSPITAL_COMMUNITY): Admitting: Licensed Clinical Social Worker

## 2023-03-29 DIAGNOSIS — F259 Schizoaffective disorder, unspecified: Secondary | ICD-10-CM

## 2023-03-29 DIAGNOSIS — R4589 Other symptoms and signs involving emotional state: Secondary | ICD-10-CM

## 2023-03-29 DIAGNOSIS — F419 Anxiety disorder, unspecified: Secondary | ICD-10-CM | POA: Insufficient documentation

## 2023-03-29 DIAGNOSIS — F32A Depression, unspecified: Secondary | ICD-10-CM | POA: Diagnosis present

## 2023-03-29 DIAGNOSIS — F333 Major depressive disorder, recurrent, severe with psychotic symptoms: Secondary | ICD-10-CM | POA: Diagnosis not present

## 2023-03-30 ENCOUNTER — Encounter (HOSPITAL_COMMUNITY): Payer: Self-pay

## 2023-03-30 NOTE — Psych (Signed)
 Virtual Visit via Video Note  I connected with Lauren Chapman on 03/28/23 at  9:00 AM EST by a video enabled telemedicine application and verified that I am speaking with the correct person using two identifiers.  Location: Patient: patient home Provider: clinical home office   I discussed the limitations of evaluation and management by telemedicine and the availability of in person appointments. The patient expressed understanding and agreed to proceed.  I discussed the assessment and treatment plan with the patient. The patient was provided an opportunity to ask questions and all were answered. The patient agreed with the plan and demonstrated an understanding of the instructions.   The patient was advised to call back or seek an in-person evaluation if the symptoms worsen or if the condition fails to improve as anticipated.  Pt was provided 240 minutes of non-face-to-face time during this encounter.   Donia Guiles, LCSW   [facility] BH PHP THERAPIST PROGRESS NOTE  Lauren Chapman [id number]  Session Time: 9:00 - 10:00  Participation Level: Active  Behavioral Response: CasualAlertDepressed  Type of Therapy: Group Therapy  Treatment Goals addressed: Coping  Progress Towards Goals: Progressing  Interventions: CBT, DBT, Supportive, and Reframing  Summary: Lauren Chapman is a 53 y.o. female who presents with depression and AH symptoms.  Clinician led check-in regarding current stressors and situation, and review of patient completed daily inventory. Clinician utilized active listening and empathetic response and validated patient emotions. Clinician facilitated processing group on pertinent issues.   Therapist Response: Patient arrived within time allowed. Patient rates her mood at a 6.5 on a scale of 1-10 with 10 being best. Pt states she feels "okay." Pt states she slept 10 hours and ate 0x. Pt reports there is rain water coming into her apartment but she is choosing not to  freak out about it. Pt states continued gum pain and having difficulty brushing her teeth due to depression. Pt reports attempting to engage in her hobby but couldn't focus. Pt reports her AH is "soft" and not command. Patient able to process. Patient engaged in discussion.          Session Time: 10:00 am - 11:00 am   Participation Level: Active   Behavioral Response: CasualAlertDepressed   Type of Therapy: Group Therapy   Treatment Goals addressed: Coping   Progress Towards Goals: Progressing   Interventions: CBT, DBT, Solution Focused, Strength-based, Supportive, and Reframing   Summary: Cln led processing group for pt's current struggles. Group members shared stressors and provided support and feedback. Cln brought in topics of boundaries, healthy relationships, and unhealthy thought processes to inform discussion.    Therapist Response:  Pt able to process and provide support to group.            Session Time: 11:00 -12:00   Participation Level: Active   Behavioral Response: CasualAlertDepressed   Type of Therapy: Group Therapy   Treatment Goals addressed: Coping   Progress Towards Goals: Progressing   Interventions: Strength-based, Supportive, and Reframing   Summary: Chaplaincy group with K. Claussen   Therapist Response: Pt participated and engaged in discussion.             Session Time: 12:00 -1:00   Participation Level: Active   Behavioral Response: CasualAlertDepressed   Type of Therapy: Group therapy   Treatment Goals addressed: Coping   Progress Towards Goals: Progressing   Interventions: OT group   Summary: 12:00 - 12:50: Occupational Therapy group with cln E. Hollan.  12:50 - 1:00  Clinician assessed for immediate needs, medication compliance and efficacy, and safety concerns.   Therapist Response: 12:00 - 12:50: See note 12:50 - 1:00 pm: At check-out, patient reports no immediate concerns. Patient demonstrates progress as evidenced by  continued engagement and responsiveness to treatment. Patient denies SI/HI at end of group.     Suicidal/Homicidal: Nowithout intent/plan  Plan: Pt will continue in PHP while working to decrease depression symptoms, increase awareness and management of AH, and increase ability to manage symptoms in a healthy manner.   Collaboration of Care: Medication Management AEB T Lewis, NP  Patient/Guardian was advised Release of Information must be obtained prior to any record release in order to collaborate their care with an outside provider. Patient/Guardian was advised if they have not already done so to contact the registration department to sign all necessary forms in order for [place] to release information regarding their care.   Consent: Patient/Guardian gives verbal consent for treatment and assignment of benefits for services provided during this visit. Patient/Guardian expressed understanding and agreed to proceed.   Diagnosis: Schizoaffective disorder, unspecified type (HCC) [F25.9]    1. Schizoaffective disorder, unspecified type (HCC)   2. Severe episode of recurrent major depressive disorder, with psychotic features (HCC)      Donia Guiles, LCSW

## 2023-03-30 NOTE — Therapy (Signed)
 [facility] PARTIAL HOSPITALIZATION PROGRAM [address] [place], [place], [address] Phone: [phone]   Fax:  [phone]  Occupational Therapy Treatment Virtual Visit via Video Note  I connected with Lauren Chapman on 03/30/23 at  8:00 AM EST by a video enabled telemedicine application and verified that I am speaking with the correct person using two identifiers.  Location: Patient: home Provider: office   I discussed the limitations of evaluation and management by telemedicine and the availability of in person appointments. The patient expressed understanding and agreed to proceed.    The patient was advised to call back or seek an in-person evaluation if the symptoms worsen or if the condition fails to improve as anticipated.  I provided 55 minutes of non-face-to-face time during this encounter.   Patient Details  Name: Lauren Chapman MRN: [id number] Date of Birth: September 20, 1970 No data recorded  Encounter Date: 03/28/2023   OT End of Session - 03/30/23 1111     Visit Number 14    Number of Visits 20    Date for OT Re-Evaluation 04/11/23    OT Start Time 1200    OT Stop Time 1255    OT Time Calculation (min) 55 min             Past Medical History:  Diagnosis Date   Allergy    Anxiety    Bipolar affective psychosis (HCC)    Complication of anesthesia    " Acting silly- waving at everyone"-Giddy   Depression    Emphysema lung (HCC)    inhaler prn,  followed by pcp and pulmonologist-- dr [name]   Family history of adverse reaction to anesthesia    Father had rheumatic fever, and died on operating table.   GERD (gastroesophageal reflux disease)    Kidney cysts 01/24/2020   Right Kidney   Migraine    Moderate persistent asthma 09/05/2021   Smokers' cough (HCC)    per pt not productive   SUI (stress urinary incontinence, female)    Tubular adenoma of colon 07/08/2020   Wears glasses     Past Surgical History:  Procedure  Laterality Date   ABDOMINAL HYSTERECTOMY N/A    Phreesia 11/30/2019   CARPAL TUNNEL RELEASE Right 09-12-2007   @[facility]    and GANGLION CYST EXCISION   COLONOSCOPY     CYSTOSCOPY N/A 04/29/2019   Procedure: CYSTOSCOPY;  Surgeon: [name], MD;  Location: [facility];  Service: Gynecology;  Laterality: N/A;   DILATATION & CURETTAGE/HYSTEROSCOPY WITH MYOSURE N/A 11/13/2018   Procedure: DILATATION & CURETTAGE/HYSTEROSCOPY WITH MYOSURE;  Surgeon: [name], MD;  Location: [facility];  Service: Gynecology;  Laterality: N/A;  request 9:00am OR start time in [place] Gyn block requests one hour   DILATION AND CURETTAGE OF UTERUS  02/2019   EYE SURGERY N/A    Phreesia 11/30/2019   GANGLION CYST EXCISION     R wrist   interstem      implant for overactive bladder   KNEE ARTHROSCOPY WITH ANTERIOR CRUCIATE LIGAMENT (ACL) REPAIR Right 10/2015   LAPAROSCOPIC CHOLECYSTECTOMY  1997   LAPAROSCOPIC HYSTERECTOMY Bilateral 04/29/2019   Procedure: HYSTERECTOMY TOTAL LAPAROSCOPIC, BILATERAL SALPINO-OOPHORECTOMY;  Surgeon: [name], MD;  Location: [facility];  Service: Gynecology;  Laterality: Bilateral;   LAPAROSCOPIC TOTAL HYSTERECTOMY     TOOTH EXTRACTION  04/01/2019   tooth removal Bilateral 2022   4 teeth removed   UPPER GASTROINTESTINAL ENDOSCOPY  There were no vitals filed for this visit.   Subjective Assessment - 03/30/23 1110     Currently in Pain? No/denies    Pain Score 0-No pain               Group Session:  S: Doing better today  O: The objective of this group is to provide a comprehensive understanding of the concept of "motivation" and its role in human behavior and well-being. The content covers various theories of motivation, including intrinsic and extrinsic motivators, and explores the psychological mechanisms that drive individuals to achieve goals, overcome obstacles, and make decisions. By diving  into real-world applications, the presentation aims to offer actionable strategies for enhancing motivation in different life domains, such as work, relationships, and personal growth. Utilizing a multi-disciplinary approach, this presentation integrates insights from psychology, neuroscience, and behavioral economics to present a holistic view of motivation. The objective is not only to educate the audience about the complexities and driving forces behind motivation but also to equip them with practical tools and techniques to improve their own motivation levels. By the end of the presentation, attendees should have a well-rounded understanding of what motivates human actions and how to harness this knowledge for personal and professional betterment.   A: The patient demonstrates a high level of engagement during the session, actively participating in discussions about motivation theories and their applicability to their own life. They show keen interest in learning new strategies to improve their motivation and even offer examples from their own experiences that align with the theories presented. Their level of self-awareness and willingness to invest in self-improvement suggest that they are well-positioned to benefit from the practical tools and techniques discussed. The patient's ability to articulate their goals and challenges further supports the likelihood of successfully implementing the strategies presented.    P: Continue to attend PHP OT group sessions 5x week for 4 weeks to promote daily structure, social engagement, and opportunities to develop and utilize adaptive strategies to maximize functional performance in preparation for safe transition and integration back into school, work, and the community. Plan to address topic of pt 3 in next OT group session.                    OT Education - 03/30/23 1110     Education Details Motivation 2              OT Short Term  Goals - 03/12/23 1025       OT SHORT TERM GOAL #1   Title Patient will be educated on strategies to improve psychosocial skills needed to participate fully in all daily, work, and leisure activities.    Time 4    Period Weeks    Status On-going    Target Date 04/11/23      OT SHORT TERM GOAL #2   Title Pt will apply psychosocial skills and coping mechanisms to daily activities in order to function independently and reintegrate into community dwelling    Status On-going      OT SHORT TERM GOAL #3   Title Pt will choose and/or engage in 1-3 socially engaging leisure activities to improve social participation skills upon reintegrating into community    Status On-going                      Plan - 03/30/23 1111     Occupational performance deficits (Please refer to evaluation for details): Social Participation;Leisure;Rest and Sleep;IADL's  Psychosocial Skills Habits;Routines and Behaviors;Interpersonal Interaction;Coping Strategies             Patient will benefit from skilled therapeutic intervention in order to improve the following deficits and impairments:       Psychosocial Skills: Habits, Routines and Behaviors, Interpersonal Interaction, Coping Strategies   Visit Diagnosis: Difficulty coping    Problem List Patient Active Problem List   Diagnosis Date Noted   Schizoaffective disorder (HCC) 03/01/2023   Poorly controlled mild persistent asthma 09/05/2021   Syncope 08/02/2021   Tobacco abuse 08/02/2021   Vaginal candidiasis 08/02/2021   CAP (community acquired pneumonia) 07/18/2021   OSA (obstructive sleep apnea) 07/18/2021   Hypokalemia 07/18/2021   Depression 03/24/2020   Bilateral low back pain with left-sided sciatica 03/24/2020   Left lumbar radiculopathy 02/25/2020   Absence of bladder continence 02/25/2020   History of postmenopausal bleeding 04/29/2019   Polypharmacy 04/03/2019   Emphysema, unspecified (HCC) 05/17/2018   Weakness  01/25/2017   Chronic migraine w/o aura w/o status migrainosus, not intractable 12/22/2014   Encounter for other general counseling or advice on contraception 09/02/2014   Bipolar disorder (HCC) 10/12/2011   Anxiety 10/12/2011   GERD (gastroesophageal reflux disease) 10/12/2011    Ted Mcalpine, OT 03/30/2023, 11:11 AM  Kerrin Champagne, OT   [facility] HOSPITALIZATION PROGRAM [address] [place], [place], [address] Phone: [phone]   Fax:  [phone]  Name: KINNEDY MONGIELLO MRN: [id number] Date of Birth: [birth year]

## 2023-03-30 NOTE — Therapy (Signed)
 [facility] PARTIAL HOSPITALIZATION PROGRAM [address] [place], [place], [address] Phone: [phone]   Fax:  [phone]  Occupational Therapy Treatment Virtual Visit via Video Note  I connected with Lauren Chapman on 03/30/23 at  8:00 AM EST by a video enabled telemedicine application and verified that I am speaking with the correct person using two identifiers.  Location: Patient: home Provider: office   I discussed the limitations of evaluation and management by telemedicine and the availability of in person appointments. The patient expressed understanding and agreed to proceed.    The patient was advised to call back or seek an in-person evaluation if the symptoms worsen or if the condition fails to improve as anticipated.  I provided 55 minutes of non-face-to-face time during this encounter.   Patient Details  Name: Lauren Chapman MRN: [id number] Date of Birth: [birth year] No data recorded  Encounter Date: 03/27/2023   OT End of Session - 03/30/23 1117     Visit Number 13    Number of Visits 20    Date for OT Re-Evaluation 04/11/23    OT Start Time 1200    OT Stop Time 1255    OT Time Calculation (min) 55 min             Past Medical History:  Diagnosis Date   Allergy    Anxiety    Bipolar affective psychosis (HCC)    Complication of anesthesia    " Acting silly- waving at everyone"-Giddy   Depression    Emphysema lung (HCC)    inhaler prn,  followed by pcp and pulmonologist-- dr [name]   Family history of adverse reaction to anesthesia    Father had rheumatic fever, and died on operating table.   GERD (gastroesophageal reflux disease)    Kidney cysts 01/24/2020   Right Kidney   Migraine    Moderate persistent asthma 09/05/2021   Smokers' cough (HCC)    per pt not productive   SUI (stress urinary incontinence, female)    Tubular adenoma of colon 07/08/2020   Wears glasses     Past Surgical History:  Procedure  Laterality Date   ABDOMINAL HYSTERECTOMY N/A    Phreesia 11/30/2019   CARPAL TUNNEL RELEASE Right 09-12-2007   @[facility]    and GANGLION CYST EXCISION   COLONOSCOPY     CYSTOSCOPY N/A 04/29/2019   Procedure: CYSTOSCOPY;  Surgeon: [name], MD;  Location: [facility];  Service: Gynecology;  Laterality: N/A;   DILATATION & CURETTAGE/HYSTEROSCOPY WITH MYOSURE N/A 11/13/2018   Procedure: DILATATION & CURETTAGE/HYSTEROSCOPY WITH MYOSURE;  Surgeon: [name], MD;  Location: [facility];  Service: Gynecology;  Laterality: N/A;  request 9:00am OR start time in [place] Gyn block requests one hour   DILATION AND CURETTAGE OF UTERUS  02/2019   EYE SURGERY N/A    Phreesia 11/30/2019   GANGLION CYST EXCISION     R wrist   interstem      implant for overactive bladder   KNEE ARTHROSCOPY WITH ANTERIOR CRUCIATE LIGAMENT (ACL) REPAIR Right 10/2015   LAPAROSCOPIC CHOLECYSTECTOMY  1997   LAPAROSCOPIC HYSTERECTOMY Bilateral 04/29/2019   Procedure: HYSTERECTOMY TOTAL LAPAROSCOPIC, BILATERAL SALPINO-OOPHORECTOMY;  Surgeon: [name], MD;  Location: [facility];  Service: Gynecology;  Laterality: Bilateral;   LAPAROSCOPIC TOTAL HYSTERECTOMY     TOOTH EXTRACTION  04/01/2019   tooth removal Bilateral 2022   4 teeth removed   UPPER GASTROINTESTINAL ENDOSCOPY  There were no vitals filed for this visit.   Subjective Assessment - 03/30/23 1117     Currently in Pain? No/denies    Pain Score 0-No pain                 Group Session:  S: Feeling okay today.   O: The objective of today's group is to provide a comprehensive understanding of the concept of "motivation" and its role in human behavior and well-being. The content covers various theories of motivation, including intrinsic and extrinsic motivators, and explores the psychological mechanisms that drive individuals to achieve goals, overcome obstacles, and make decisions.  By diving into real-world applications, the group aims to offer actionable strategies for enhancing motivation in different life domains, such as work, relationships, and personal growth.  Utilizing a multi-disciplinary approach, this group integrates insights from psychology, neuroscience, and behavioral economics to present a holistic view of motivation. The objective is not only to educate the audience about the complexities and driving forces behind motivation but also to equip them with practical tools and techniques to improve their own motivation levels. By the end of this multi-day group, patient's should have a well-rounded understanding of what motivates human actions and how to harness this knowledge for personal and professional betterment.   A: The patient demonstrates a high level of engagement during the session, actively participating in discussions about motivation theories and their applicability to their own life. They show keen interest in learning new strategies to improve their motivation and even offer examples from their own experiences that align with the theories presented. Their level of self-awareness and willingness to invest in self-improvement suggest that they are well-positioned to benefit from the practical tools and techniques discussed. The patient's ability to articulate their goals and challenges further supports the likelihood of successfully implementing the strategies presented.    P: Continue to attend PHP OT group sessions 5x week for 4 weeks to promote daily structure, social engagement, and opportunities to develop and utilize adaptive strategies to maximize functional performance in preparation for safe transition and integration back into school, work, and the community. Plan to address topic of pt 2 in next OT group session.                  OT Education - 03/30/23 1117     Education Details Motivation 1              OT Short Term  Goals - 03/12/23 1025       OT SHORT TERM GOAL #1   Title Patient will be educated on strategies to improve psychosocial skills needed to participate fully in all daily, work, and leisure activities.    Time 4    Period Weeks    Status On-going    Target Date 04/11/23      OT SHORT TERM GOAL #2   Title Pt will apply psychosocial skills and coping mechanisms to daily activities in order to function independently and reintegrate into community dwelling    Status On-going      OT SHORT TERM GOAL #3   Title Pt will choose and/or engage in 1-3 socially engaging leisure activities to improve social participation skills upon reintegrating into community    Status On-going                      Plan - 03/30/23 1118     Psychosocial Skills Habits;Routines and Behaviors;Interpersonal Interaction;Coping Strategies  Patient will benefit from skilled therapeutic intervention in order to improve the following deficits and impairments:       Psychosocial Skills: Habits, Routines and Behaviors, Interpersonal Interaction, Coping Strategies   Visit Diagnosis: Difficulty coping    Problem List Patient Active Problem List   Diagnosis Date Noted   Schizoaffective disorder (HCC) 03/01/2023   Poorly controlled mild persistent asthma 09/05/2021   Syncope 08/02/2021   Tobacco abuse 08/02/2021   Vaginal candidiasis 08/02/2021   CAP (community acquired pneumonia) 07/18/2021   OSA (obstructive sleep apnea) 07/18/2021   Hypokalemia 07/18/2021   Depression 03/24/2020   Bilateral low back pain with left-sided sciatica 03/24/2020   Left lumbar radiculopathy 02/25/2020   Absence of bladder continence 02/25/2020   History of postmenopausal bleeding 04/29/2019   Polypharmacy 04/03/2019   Emphysema, unspecified (HCC) 05/17/2018   Weakness 01/25/2017   Chronic migraine w/o aura w/o status migrainosus, not intractable 12/22/2014   Encounter for other general counseling or  advice on contraception 09/02/2014   Bipolar disorder (HCC) 10/12/2011   Anxiety 10/12/2011   GERD (gastroesophageal reflux disease) 10/12/2011    Ted Mcalpine, OT 03/30/2023, 11:18 AM  Kerrin Champagne, OT   [facility] HOSPITALIZATION PROGRAM [address] [place], [place], [address] Phone: [phone]   Fax:  [phone]  Name: Lauren Chapman MRN: [id number] Date of Birth: 1971/01/08

## 2023-03-31 NOTE — Psych (Signed)
 Virtual Visit via Video Note  I connected with Lauren Chapman on 03/29/23 at  9:00 AM EST by a video enabled telemedicine application and verified that I am speaking with the correct person using two identifiers.  Location: Patient: patient home Provider: clinical home office   I discussed the limitations of evaluation and management by telemedicine and the availability of in person appointments. The patient expressed understanding and agreed to proceed.  I discussed the assessment and treatment plan with the patient. The patient was provided an opportunity to ask questions and all were answered. The patient agreed with the plan and demonstrated an understanding of the instructions.   The patient was advised to call back or seek an in-person evaluation if the symptoms worsen or if the condition fails to improve as anticipated.  Pt was provided 240 minutes of non-face-to-face time during this encounter.   Donia Guiles, LCSW   [facility] PHP THERAPIST PROGRESS NOTE  Lauren Chapman [id number]  Session Time: 9:00 - 10:00  Participation Level: Active  Behavioral Response: CasualAlertDepressed  Type of Therapy: Group Therapy  Treatment Goals addressed: Coping  Progress Towards Goals: Progressing  Interventions: CBT, DBT, Supportive, and Reframing  Summary: Lauren Chapman is a 53 y.o. female who presents with depression and AH symptoms.  Clinician led check-in regarding current stressors and situation, and review of patient completed daily inventory. Clinician utilized active listening and empathetic response and validated patient emotions. Clinician facilitated processing group on pertinent issues.   Therapist Response: Patient arrived within time allowed. Patient rates her mood at a 5 on a scale of 1-10 with 10 being best. Pt states she feels "nervous." Pt states she slept 10 hours and ate 1x. Pt reports she had an infusion appointment for migraines yesterday and stayed out and  about. Pt shares she ran errands and went to the bookstore. Pt reports alibility to to enjoy herself and felt worn out afterwards. Pt shares she is anxious about discharging from group and transitioning to IOP. Patient able to process. Patient engaged in discussion.          Session Time: 10:00 am - 11:00 am   Participation Level: Active   Behavioral Response: CasualAlertDepressed   Type of Therapy: Group Therapy   Treatment Goals addressed: Coping   Progress Towards Goals: Progressing   Interventions: CBT, DBT, Solution Focused, Strength-based, Supportive, and Reframing   Summary: Cln led discussion on unrealistic expectations and the ways expectations can alter perspective. Group members discussed feelings and situations that have occurred due to expectation and how to know if they are unrealistic. Cln brought in CBT thought challenging and reframing to aid discussion.    Therapist Response: Pt engaged in discussion and reports gaining insight.            Session Time: 11:00 -12:00   Participation Level: Active   Behavioral Response: CasualAlertDepressed   Type of Therapy: Group Therapy   Treatment Goals addressed: Coping   Progress Towards Goals: Progressing   Interventions: CBT, DBT, Solution Focused, Strength-based, Supportive, and Reframing   Summary: Cln continued topic of boundaries. Cln discussed the different ways boundaries present: physical, emotional, intellectual, sexual, material, and time. Group talked about the ways in which each type presents for them and is a struggle.    Therapist Response: Pt engaged in discussion and identified struggles with each type.             Session Time: 12:00 -1:00   Participation Level: Active  Behavioral Response: CasualAlertDepressed   Type of Therapy: Group therapy   Treatment Goals addressed: Coping   Progress Towards Goals: Progressing   Interventions: OT group   Summary: 12:00 - 12:50: Occupational  Therapy group with cln E. Hollan.  12:50 - 1:00 Clinician assessed for immediate needs, medication compliance and efficacy, and safety concerns.   Therapist Response: 12:00 - 12:50: See note 12:50 - 1:00 pm: At check-out, patient reports no immediate concerns. Patient demonstrates progress as evidenced by continued engagement and responsiveness to treatment. Patient denies SI/HI at end of group.     Suicidal/Homicidal: Nowithout intent/plan  Plan: Pt will discharge from PHP due to meeting treatment goals of decreased depression symptoms, increased awareness and management of AH, and increased ability to manage symptoms in a healthy manner. Pt will step down to IOP within this agency beginning 3/10. Pt and provider are aligned with discharge plan. Pt denies SI at time of discharge.   Collaboration of Care: Medication Management AEB T Lewis, NP  Patient/Guardian was advised Release of Information must be obtained prior to any record release in order to collaborate their care with an outside provider. Patient/Guardian was advised if they have not already done so to contact the registration department to sign all necessary forms in order for [place] to release information regarding their care.   Consent: Patient/Guardian gives verbal consent for treatment and assignment of benefits for services provided during this visit. Patient/Guardian expressed understanding and agreed to proceed.   Diagnosis: Schizoaffective disorder, unspecified type (HCC) [F25.9]    1. Schizoaffective disorder, unspecified type (HCC)      Donia Guiles, LCSW

## 2023-04-02 ENCOUNTER — Encounter (HOSPITAL_COMMUNITY): Payer: Self-pay | Admitting: Psychiatry

## 2023-04-02 ENCOUNTER — Encounter (HOSPITAL_COMMUNITY): Payer: Self-pay

## 2023-04-02 ENCOUNTER — Ambulatory Visit: Payer: 59 | Admitting: Family Medicine

## 2023-04-02 ENCOUNTER — Other Ambulatory Visit (HOSPITAL_COMMUNITY): Attending: Psychiatry | Admitting: Psychiatry

## 2023-04-02 DIAGNOSIS — F411 Generalized anxiety disorder: Secondary | ICD-10-CM

## 2023-04-02 DIAGNOSIS — F333 Major depressive disorder, recurrent, severe with psychotic symptoms: Secondary | ICD-10-CM

## 2023-04-02 MED ORDER — GABAPENTIN 400 MG PO CAPS
800.0000 mg | ORAL_CAPSULE | Freq: Three times a day (TID) | ORAL | Status: AC
Start: 2023-04-02 — End: ?

## 2023-04-02 MED ORDER — PROPRANOLOL HCL 10 MG PO TABS: 10.0000 mg | ORAL_TABLET | Freq: Two times a day (BID) | ORAL | 0 refills | Status: DC | PRN

## 2023-04-02 MED ORDER — CITALOPRAM HYDROBROMIDE 20 MG PO TABS: 30.0000 mg | ORAL_TABLET | Freq: Every day | ORAL | 0 refills | Status: DC

## 2023-04-02 NOTE — Progress Notes (Signed)
 Virtual Visit via Video Note   I connected with Dois Davenport L. Plagge on 04/02/23 at  9:00 AM EDT by a video enabled telemedicine application and verified that I am speaking with the correct person using two identifiers.   At orientation to the IOP program, Case Manager discussed the limitations of evaluation and management by telemedicine and the availability of in person appointments. The patient expressed understanding and agreed to proceed with virtual visits throughout the duration of the program.   Location:  Patient: Patient Home Provider: [facility]   History of Present Illness: MDD and GAD   Observations/Objective: Check In: Case Manager checked in with all participants to review discharge dates, insurance authorizations, work-related documents and needs from the treatment team regarding medications. Raveena stated needs and engaged in discussion.    Initial Therapeutic Activity: Counselor facilitated a check-in with Oleva to assess for safety, sobriety and medication compliance.  Counselor also inquired about Maitland's current emotional ratings, as well as any significant changes in thoughts, feelings or behavior since previous check in.  Rashaunda presented for session on time and was alert, oriented x5, with no evidence or self-report of active SI/HI or A/V H.  Evoleth reported compliance with medication and denied use of alcohol or illicit substances.  Tranice reported scores of 3/10 for depression, 8/10 for anxiety, and 0/10 for anger/irritability.  Damali denied any recent outbursts.  Aysel reported that a struggle was having a panic attack yesterday, stating "My job is closing down and there are so many rude people that it overwhelms me".  Idora reported that a success was completing PHP and deciding to continue with treatment through [facility].  Illeana reported that her goal is to attend a dentist appointment after group.         Second Therapeutic Activity: Counselor introduced topic of  self-esteem today and defined this as the value an individual places on oneself, based upon assessment of personal worth as a human being and approval/disapproval of one's behavior. Counselor asked members to assess their level of self-esteem at this time based upon common indicators of high self-esteem, including: accepting oneself unconditionally;  having self-respect and deep seated belief that one matters; being unaffected by other people's opinions/criticisms; and showing good control over emotions.  Counselor also explained concept of one's inner critic which serves to highlight faults and minimize strengths, directly influencing low sense of self-esteem.  Counselor then provided handout on 'strengths and qualities', which featured questions to guide discussion and increase awareness of each member's unique individual abilities which could reinforce higher self-esteem. Examples of questions included: 'things I am good at', 'challenges I have overcome', and 'what I like about myself'.  Intervention was effective, as evidenced by Dois Davenport actively engaging in discussion on topic, and completing a self-esteem assessment, receiving a score of 6, which indicated a 'negative' level of self-esteem at this time due to traits such as not taking compliments, holding herself to unrealistic standards, using substances to cope with distressing feelings, being anxious and insecure around others, and being filled with many kind of fears.  Assyria stated "I don't have good self-esteem. I don't think I ever had it".  Arriyana was receptive to several strategies offered today for increasing self-esteem during treatment, including prioritizing self-care activities in her routine such as maintaining personal hygiene, using a journal to track daily accomplishments, seeking supportive feedback from her daughters or friends when feeling down, and setting healthier boundaries with her parents, who tends to reinforce negative self-image.  Assessment and Plan: Counselor recommends that Doreene remain in IOP treatment to better manage mental health symptoms, ensure stability and pursue completion of treatment plan goals. Counselor recommends adherence to crisis/safety plan, taking medications as prescribed, and following up with medical professionals if any issues arise.    Follow Up Instructions: Counselor will send Webex link for session tomorrow.  Izel was advised to call back or seek an in-person evaluation if the symptoms worsen or if the condition fails to improve as anticipated.   Collaboration of Care:   Medication Management AEB Hillery Jacks, NP or Dr. Park Pope                                          Case Manager AEB Jeri Modena, CNA    Patient/Guardian was advised Release of Information must be obtained prior to any record release in order to collaborate their care with an outside provider. Patient/Guardian was advised if they have not already done so to contact the registration department to sign all necessary forms in order for [place] to release information regarding their care.    Consent: Patient/Guardian gives verbal consent for treatment and assignment of benefits for services provided during this visit. Patient/Guardian expressed understanding and agreed to proceed.   I provided 180 minutes of non-face-to-face time during this encounter.   Noralee Stain, LCSW, LCAS 04/02/23

## 2023-04-02 NOTE — Progress Notes (Signed)
 Virtual Visit via Video Note  I connected with Lauren Chapman on @TODAY @ at  9:00 AM EDT by a video enabled telemedicine application and verified that I am speaking with the correct person using two identifiers.  Location: Patient: at home Provider: at office   I discussed the limitations of evaluation and management by telemedicine and the availability of in person appointments. The patient expressed understanding and agreed to proceed.   I discussed the assessment and treatment plan with the patient. The patient was provided an opportunity to ask questions and all were answered. The patient agreed with the plan and demonstrated an understanding of the instructions.   The patient was advised to call back or seek an in-person evaluation if the symptoms worsen or if the condition fails to improve as anticipated.  I provided 20 minutes of non-face-to-face time during this encounter.   Lauren Chapman, M.Ed,CNA   Patient ID: Lauren Chapman, female   DOB: 1970/05/25, 53 y.o.   MRN: [id number] D:  As per previous CCA states:  "Lauren Chapman is a 53yo female referred to [facility] following a 4 day inpatient stay for SI with a plan. She cites her stressors as anxiety and her relationship with her mother. She reports decreased ADLs, stating she will sometimes go a week without showering. She currently takes Gabapentin, Buspar, Celexa, Navene, and Tegretol. She reports current therapy and outpatient medication management and hx of same. She endorses multiple hospitalizations, three of which were recent and back-to-back. She states the first was for Xanax detox, as she had begun overusing her prescription to cope with anxiety. The second was to finish detoxing and to make medication adjustments, as she had been taking 12 psych meds. She states, "it seems like every five years I go back to the hospital." She endorses five suicide attempts, the last one being 8-10 years ago. She has been diagnosed with  schizoaffective disorder and reports she first started experiencing symptoms at age 55. She reports recent passive SI and completes safety planning with cln. She denies HI, current and recent substance use, and states there are no firearms in her home. She endorses AVH, which is her baseline. She endorses previous NSSI by cutting and burning herself with cigarettes several years ago. She reports no family hx of confirmed mental health diagnoses. She cites her two daughters and her friend Marisue Ivan (who lives in [place]) as her supports. She currently lives with her 31yo daughter and grandchildren (ages 55 and 3 weeks). She reports previous alcohol abuse in her teens and early 39s. She reports medical hx of migraines, overactive bladder with implanted bladder stimulator, asthma, and emphysema.   Current Symptoms/Problems: passive SI, anxiety, AVH- mostly hearing her name, seeing cars in front of her turn into bugs and fly away, colors swirling. Sometimes will have command hallucinations to hurt herself, but not often. Last experienced command hallucination two weeks ago. Depression symptoms; crying, lying in bed, watching videos. Anxiety; "crying, shaking, having a panic attack, mania." States she does not experience mania often, about once a month. When asked about the last manic episode, states "It's hard to tell because I had the Xanax problem and I was in the hospital." Reports the mania is triggered by good things happening, such as getting a job. Currently in depressive state. Normally has a low appetite. Reports panic attacks once or twice a week, sometimes can identify a trigger, will last an hour. Feels a weight in her stomach, crying and breathing heavily, SOB,  shaking. Sleep is erratic."  Pt stepped down from virtual PHP to virtual [facility] today.   States PHP was helpful.  Pt continues to fight thru the depression and on and off passive SI due to her daughter moving away.  Pt denies any SI today.  She also  denies HI and V-hallucinations.  Admits to ongoing A-hallucinations.  On a scale of 1-10 (10 being the worst); pt rates her depression at a 8 and anxiety at a 9. A:  Oriented pt.   Pt was advised of ROI must be obtained prior to any records release in order to collaborate her care with an outside provider.  Pt was advised if she has not already done so to contact the front desk to sign all necessary forms in order for [facility] to release info re: her care.  Consent:  Pt gives verbal consent for tx and assignment of benefits for services provided during this telehealth group process.  Pt expressed understanding and agreed to proceed. Collaboration of care:  Collaborate with Hillery Jacks, NP AEB; Dr. Park Pope AEB; Dr. Chester Holstein AEB; [employer], LCSW AEB and [employer], LCSW AEB.   Encouraged support groups through [facility].    Pt will improve her mood as evidenced by being happy again, managing her mood and coping with daily stressors for 5 out of 7 days for 60 days.  R:  Pt receptive.  Lauren Chapman, M.Ed,CNA

## 2023-04-02 NOTE — Progress Notes (Signed)
 Psychiatric Initial Adult Assessment  Intensive Outpatient Program  Virtual Visit via Video Note   I connected with Lauren Chapman on 04/02/2023,  9:00 AM EDT by a video enabled telemedicine application and verified that I am speaking with the correct person using two identifiers.   Location: Patient: Home Provider: Clinic   I discussed the limitations of evaluation and management by telemedicine and the availability of in person appointments. The patient expressed understanding and agreed to proceed.   Follow Up Instructions:   I discussed the assessment and treatment plan with the patient. The patient was provided an opportunity to ask questions and all were answered. The patient agreed with the plan and demonstrated an understanding of the instructions.   The patient was advised to call back or seek an in-person evaluation if the symptoms worsen or if the condition fails to improve as anticipated.   Park Pope, MD  Patient Identification: Lauren Chapman MRN:  [id number] Date of Evaluation:  04/02/2023 Referral Source: Mauri Pole, LCSW Chief Complaint:   Chief Complaint  Patient presents with   Depression   Schizophrenia    Visit Diagnosis:    ICD-10-CM   1. Severe episode of recurrent major depressive disorder, with psychotic features (HCC)  F33.3 citalopram (CELEXA) 20 MG tablet    propranolol (INDERAL) 10 MG tablet    gabapentin (NEURONTIN) 400 MG capsule    2. GAD (generalized anxiety disorder)  F41.1 propranolol (INDERAL) 10 MG tablet      History of Present Illness:  Lauren Chapman is a 53 y.o. female with history of depression and AH symptoms presenting as a stepdown from [facility] for worsening depression, SI, and AH. She has been overall doing better since start of PHP ~1 month ago.   Per CCA: She cites her stressors as anxiety and her relationship with her mother. She reports decreased ADLs, stating she will sometimes go a week without showering. She currently  takes Gabapentin, Buspar, Celexa, Navene, and Tegretol. She reports current therapy and outpatient medication management and hx of same. She endorses multiple hospitalizations, three of which were recent and back-to-back. She states the first was for Xanax detox, as she had begun overusing her prescription to cope with anxiety. The second was to finish detoxing and to make medication adjustments, as she had been taking 12 psych meds. She states, "it seems like every five years I go back to the hospital." She endorses five suicide attempts, the last one being 8-10 years ago. She has been diagnosed with schizoaffective disorder and reports she first started experiencing symptoms at age 58. She reports recent passive SI and completes safety planning with cln. She denies HI, current and recent substance use, and states there are no firearms in her home. She endorses AVH, which is her baseline. She endorses previous NSSI by cutting and burning herself with cigarettes several years ago. She reports no family hx of confirmed mental health diagnoses. She cites her two daughters and her friend Lauren Ivan (who lives in [place]) as her supports. She currently lives with her 31yo daughter and grandchildren (ages 99 and 3 weeks). She reports previous alcohol abuse in her teens and early 49s. She reports medical hx of migraines, overactive bladder with implanted bladder stimulator, asthma, and emphysema.   At this time, she endorses feeling persistent anxiety but overall feeling better than before. She endorses passive SI but denies HI/VH. She states AH has improved and are barely audible at this time. She reports eating and sleeping  appropriately. She was amenable to increasing her celexa to aid with baseline depression and starting propranolol PRN for anxiety. She had previously been on high dose propranolol for migraines.  Associated Signs/Symptoms: Depression Symptoms:  depressed mood, anhedonia, fatigue, difficulty  concentrating, hopelessness, suicidal thoughts without plan, (Hypo) Manic Symptoms:   n/a Anxiety Symptoms:  Excessive Worry, Psychotic Symptoms:  Hallucinations: Auditory PTSD Symptoms: NA  Past Psychiatric History:  Diagnoses: schizoaffective disorder, GAD, MDD Previous psychiatrist/therapist: Sees Lauren Andrea, NP Hospitalizations: endorses Substance Use History: EtOH:  reports no history of alcohol use. Nicotine:  reports that she quit smoking about 20 months ago. Her smoking use included cigarettes. She started smoking about 41 years ago. She has a 39.8 pack-year smoking history. She has never been exposed to tobacco smoke. She has never used smokeless tobacco.  Past Medical History: Dx:  has a past medical history of Allergy, Anxiety, Bipolar affective psychosis (HCC), Complication of anesthesia, Depression, Emphysema lung (HCC), Family history of adverse reaction to anesthesia, GERD (gastroesophageal reflux disease), Kidney cysts (01/24/2020), Migraine, Moderate persistent asthma (09/05/2021), Smokers' cough (HCC), SUI (stress urinary incontinence, female), Tubular adenoma of colon (07/08/2020), and Wears glasses.  Allergies: Codeine    Previous Psychotropic Medications: Yes   Substance Abuse History in the last 12 months:  No.  Consequences of Substance Abuse: Negative  Past Medical History:  Past Medical History:  Diagnosis Date   Allergy    Anxiety    Bipolar affective psychosis (HCC)    Complication of anesthesia    " Acting silly- waving at everyone"-Giddy   Depression    Emphysema lung (HCC)    inhaler prn,  followed by pcp and pulmonologist-- dr [name]   Family history of adverse reaction to anesthesia    Father had rheumatic fever, and died on operating table.   GERD (gastroesophageal reflux disease)    Kidney cysts 01/24/2020   Right Kidney   Migraine    Moderate persistent asthma 09/05/2021   Smokers' cough (HCC)    per pt not productive   SUI  (stress urinary incontinence, female)    Tubular adenoma of colon 07/08/2020   Wears glasses     Past Surgical History:  Procedure Laterality Date   ABDOMINAL HYSTERECTOMY N/A    Phreesia 11/30/2019   CARPAL TUNNEL RELEASE Right 09-12-2007   @[facility]    and GANGLION CYST EXCISION   COLONOSCOPY     CYSTOSCOPY N/A 04/29/2019   Procedure: CYSTOSCOPY;  Surgeon: [name], MD;  Location: [facility];  Service: Gynecology;  Laterality: N/A;   DILATATION & CURETTAGE/HYSTEROSCOPY WITH MYOSURE N/A 11/13/2018   Procedure: DILATATION & CURETTAGE/HYSTEROSCOPY WITH MYOSURE;  Surgeon: [name], MD;  Location: [facility];  Service: Gynecology;  Laterality: N/A;  request 9:00am OR start time in [place] [facility] block requests one hour   DILATION AND CURETTAGE OF UTERUS  02/2019   EYE SURGERY N/A    Phreesia 11/30/2019   GANGLION CYST EXCISION     R wrist   interstem      implant for overactive bladder   KNEE ARTHROSCOPY WITH ANTERIOR CRUCIATE LIGAMENT (ACL) REPAIR Right 10/2015   LAPAROSCOPIC CHOLECYSTECTOMY  1997   LAPAROSCOPIC HYSTERECTOMY Bilateral 04/29/2019   Procedure: HYSTERECTOMY TOTAL LAPAROSCOPIC, BILATERAL SALPINO-OOPHORECTOMY;  Surgeon: [name], MD;  Location: [facility];  Service: Gynecology;  Laterality: Bilateral;   LAPAROSCOPIC TOTAL HYSTERECTOMY     TOOTH EXTRACTION  04/01/2019   tooth removal Bilateral 2022   4 teeth removed  UPPER GASTROINTESTINAL ENDOSCOPY      Family History:  Family History  Problem Relation Age of Onset   Depression Mother    Cancer Mother        Cervical   Alcohol abuse Father    Crohn's disease Sister    Stroke Brother 22   Migraines Daughter    GER disease Daughter    Depression Daughter    Bipolar disorder Daughter    Migraines Daughter    GER disease Daughter    Colon cancer Neg Hx    Rectal cancer Neg Hx    Stomach cancer Neg Hx     Social History:   Social History    Socioeconomic History   Marital status: Divorced    Spouse name: n/a   Number of children: 2   Years of education: 18   Highest education level: Master's degree (e.g., MA, MS, MEng, MEd, MSW, MBA)  Occupational History   Occupation: Disabled    Comment: Formerly a Runner, broadcasting/film/video.  Tobacco Use   Smoking status: Former    Current packs/day: 0.00    Average packs/day: 1 pack/day for 39.8 years (39.8 ttl pk-yrs)    Types: Cigarettes    Start date: 10/10/1981    Quit date: 07/18/2021    Years since quitting: 1.7    Passive exposure: Never   Smokeless tobacco: Never  Vaping Use   Vaping status: Never Used  Substance and Sexual Activity   Alcohol use: Never    Alcohol/week: 0.0 standard drinks of alcohol   Drug use: Never   Sexual activity: Not Currently    Partners: Male    Birth control/protection: Post-menopausal    Comment: 1st intercourse 53 yo-More than 5 partners  Other Topics Concern   Not on file  Social History Narrative   Lives at home with 1 of her two daughters   Right-handed.   2-4 cups caffeine daily.   Disabled    One story home   Social Drivers of Health   Financial Resource Strain: Low Risk  (01/09/2023)   Overall Financial Resource Strain (CARDIA)    Difficulty of Paying Living Expenses: Not very hard  Food Insecurity: Food Insecurity Present (03/02/2023)   Hunger Vital Sign    Worried About Running Out of Food in the Last Year: Sometimes true    Ran Out of Food in the Last Year: Sometimes true  Transportation Needs: No Transportation Needs (03/02/2023)   PRAPARE - Administrator, Civil Service (Medical): No    Lack of Transportation (Non-Medical): No  Physical Activity: Inactive (01/09/2023)   Exercise Vital Sign    Days of Exercise per Week: 0 days    Minutes of Exercise per Session: 0 min  Stress: No Stress Concern Present (02/15/2023)   Received from [facility] - Occupational Stress Questionnaire     Feeling of Stress : Only a little  Recent Concern: Stress - Stress Concern Present (02/05/2023)   Received from [facility] - Occupational Stress Questionnaire    Feeling of Stress : To some extent  Social Connections: Socially Isolated (01/09/2023)   Social Connection and Isolation Panel [NHANES]    Frequency of Communication with Friends and Family: Never    Frequency of Social Gatherings with Friends and Family: Never    Attends Religious Services: Never    Database administrator or Organizations: No    Attends Banker  Meetings: Never    Marital Status: Divorced   Allergies:   Allergies  Allergen Reactions   Codeine Anaphylaxis and Other (See Comments)    Cannot have ANYTHING with codeine    Metabolic Disorder Labs: Lab Results  Component Value Date   HGBA1C 5.8 05/10/2022   No results found for: "PROLACTIN" Lab Results  Component Value Date   CHOL 153 11/07/2021   TRIG 134.0 11/07/2021   HDL 52.50 11/07/2021   CHOLHDL 3 11/07/2021   VLDL 26.8 11/07/2021   LDLCALC 73 11/07/2021   LDLCALC 99 05/27/2021   Lab Results  Component Value Date   TSH 1.33 07/07/2022    Therapeutic Level Labs: No results found for: "LITHIUM" No results found for: "CBMZ" No results found for: "VALPROATE"  Current Medications: Current Outpatient Medications  Medication Sig Dispense Refill   albuterol (VENTOLIN HFA) 108 (90 Base) MCG/ACT inhaler Inhale 2 puffs into the lungs every 4 (four) hours as needed for wheezing or shortness of breath. 1 each 1   budesonide-formoterol (SYMBICORT) 160-4.5 MCG/ACT inhaler Inhale 2 puffs into the lungs 2 (two) times daily. 1 each 6   busPIRone (BUSPAR) 15 MG tablet Take 1 tablet (15 mg total) by mouth 3 (three) times daily. 30 tablet 0   carbamazepine (TEGRETOL XR) 100 MG 12 hr tablet Take 100 mg by mouth 2 (two) times daily.     citalopram (CELEXA) 20 MG tablet Take 1.5 tablets (30 mg total)  by mouth daily. 45 tablet 0   EPINEPHrine 0.3 mg/0.3 mL IJ SOAJ injection Inject 0.3 mg into the muscle as needed for anaphylaxis. 1 each 0   esomeprazole (NEXIUM) 20 MG capsule Take 1 capsule (20 mg total) by mouth 2 (two) times daily before a meal. 60 capsule 1   gabapentin (NEURONTIN) 400 MG capsule Take 2 capsules (800 mg total) by mouth 3 (three) times daily.     ibuprofen (ADVIL) 800 MG tablet Take 800 mg by mouth every 8 (eight) hours as needed.     MYRBETRIQ 50 MG TB24 tablet Take 50 mg by mouth daily.     ondansetron (ZOFRAN) 4 MG tablet Take 1 tablet (4 mg total) by mouth every 8 (eight) hours as needed for nausea or vomiting. 20 tablet 6   propranolol (INDERAL) 10 MG tablet Take 1 tablet (10 mg total) by mouth 2 (two) times daily as needed. 60 tablet 0   Rimegepant Sulfate (NURTEC) 75 MG TBDP Take 1 tablet (75 mg total) by mouth daily as needed. 8 tablet 11   SUMAtriptan (IMITREX) 6 MG/0.5ML SOLN injection Inject 0.5 mLs (6 mg total) into the skin See admin instructions. INJECT 6 MG (0.5 ml) INTO THE SKIN AS NEEDED FOR MIGRAINE OR HEADACHE. MAY REPEAT ONCE IN 2 HOURS IF HEADACHE PERSISTS OR RECURS. MAX 2 DOSES IN 24 HOURS. 3 mL 5   thiothixene (NAVANE) 5 MG capsule Take 5 mg by mouth 3 (three) times daily.     tiZANidine (ZANAFLEX) 4 MG tablet Take 1 tablet (4 mg total) by mouth every 6 (six) hours as needed for muscle spasms. 30 tablet 5   No current facility-administered medications for this visit.   VIRTUAL VISIT, LIMITED ASSESSMENT Musculoskeletal: Strength & Muscle Tone: unable to assess Gait & Station: unable to assess Patient leans: unable to assess  Psychiatric Specialty Exam: General Appearance: Casual, faily groomed  Eye Contact:  Good    Speech:  Clear, coherent, normal rate   Volume:  Normal   Mood:  "ok"  Affect:  Appropriate, congruent, full range  Thought Content: Logical, rumination  Suicidal Thoughts: Denied active SI, endorses passive SI    Thought Process:   Coherent, goal-directed, linear   Orientation:  A&Ox4   Memory:  Immediate good  Judgment:  Fair   Insight:  Fair  Concentration:  Attention and concentration good   Recall:  Good  Fund of Knowledge: Good  Language: Good, fluent  Psychomotor Activity: grossly appears normal  Akathisia:  NA   AIMS (if indicated): NA   Assets:  Communication Skills Desire for Improvement Resilience  ADL's:  Intact  Cognition: WNL  Sleep:  fair    Screenings: GAD-7    Flowsheet Row Office Visit from 03/02/2023 in [facility] Video Visit from 03/01/2023 in BEHAVIORAL HEALTH PARTIAL HOSPITALIZATION PROGRAM Office Visit from 09/11/2022 in [facility] Visit from 08/18/2022 in [facility] Visit from 07/07/2022 in [facility]  Total GAD-7 Score 15 16 14 21 20       PHQ2-9    Flowsheet Row Counselor from 04/02/2023 in BEHAVIORAL HEALTH INTENSIVE [facility] Clinical Support from 03/28/2023 in [facility] Counselor from 03/07/2023 in BEHAVIORAL HEALTH PARTIAL HOSPITALIZATION PROGRAM Office Visit from 03/02/2023 in [facility] Video Visit from 03/01/2023 in BEHAVIORAL HEALTH PARTIAL HOSPITALIZATION PROGRAM  PHQ-2 Total Score 6 0 6 5 6   PHQ-9 Total Score 16 -- 16 13 18       Flowsheet Row Counselor from 04/02/2023 in BEHAVIORAL HEALTH INTENSIVE PSYCH Counselor from 03/07/2023 in BEHAVIORAL HEALTH PARTIAL HOSPITALIZATION PROGRAM Video Visit from 03/01/2023 in BEHAVIORAL HEALTH PARTIAL HOSPITALIZATION PROGRAM  C-SSRS RISK CATEGORY Error: Question 6 not populated High Risk High Risk       Assessment and Plan:  Lauren Chapman is a 53 y.o. female with history of depression and AH symptoms presenting as a stepdown from [facility] for worsening depression, SI, and AH. She has been  overall doing better since start of PHP ~1 month ago.  Given her continued symptoms anxiety, increase in Celexa is reasonable to better account for this. Her symptoms remain consistent with MDD severe recurrent episode with psychotic features although schizoaffective disorder needs to be ruled out.  Propranolol was restarted as an as needed medication to aid with anxiety given poor efficacy of hydroxyzine.   Major depressive disorder-recurrent episode, severe with psychotic features R/o schizoaffective disorder GAD -increase Citalopram to 30 mg daily -start propranolol 10 mg bid prn for anxiety -continue Tegretol 100 mg bid -continue Buspirone 15 mg 3 times daily -Continue gabapentin 800 mg 3 times daily   Collaboration of Care: Case was staffed with attending, see attestation per above.   Patient/Guardian was advised Release of Information must be obtained prior to any record release in order to collaborate their care with an outside provider. Patient/Guardian was advised if they have not already done so to contact the registration department to sign all necessary forms in order for [place] to release information regarding their care.   Consent: Patient/Guardian gives verbal consent for treatment and assignment of benefits for services provided during this visit. Patient/Guardian expressed understanding and agreed to proceed.   Park Pope, MD

## 2023-04-03 ENCOUNTER — Other Ambulatory Visit (HOSPITAL_COMMUNITY): Admitting: Psychiatry

## 2023-04-03 ENCOUNTER — Encounter (HOSPITAL_COMMUNITY): Payer: Self-pay

## 2023-04-03 ENCOUNTER — Other Ambulatory Visit (HOSPITAL_COMMUNITY)

## 2023-04-03 DIAGNOSIS — F333 Major depressive disorder, recurrent, severe with psychotic symptoms: Secondary | ICD-10-CM | POA: Diagnosis not present

## 2023-04-03 DIAGNOSIS — F411 Generalized anxiety disorder: Secondary | ICD-10-CM

## 2023-04-03 NOTE — Progress Notes (Signed)
 Virtual Visit via Video Note   I connected with Lauren Chapman on 04/03/23 at  9:00 AM EDT by a video enabled telemedicine application and verified that I am speaking with the correct person using two identifiers.   At orientation to the IOP program, Case Manager discussed the limitations of evaluation and management by telemedicine and the availability of in person appointments. The patient expressed understanding and agreed to proceed with virtual visits throughout the duration of the program.   Location:  Patient: Patient Home Provider: OPT BH Office   History of Present Illness: MDD and GAD   Observations/Objective: Check In: Case Manager checked in with all participants to review discharge dates, insurance authorizations, work-related documents and needs from the treatment team regarding medications. Lauren Chapman stated needs and engaged in discussion.    Initial Therapeutic Activity: Counselor facilitated a check-in with Lauren Chapman to assess for safety, sobriety and medication compliance.  Counselor also inquired about Lauren Chapman's current emotional ratings, as well as any significant changes in thoughts, feelings or behavior since previous check in.  Lauren Chapman presented for session on time and was alert, oriented x5, with no evidence or self-report of active SI/HI or A/V H.  Lauren Chapman reported compliance with medication and denied use of alcohol or illicit substances.  Lauren Chapman reported scores of 2/10 for depression, 7/10 for anxiety, and 0/10 for anger/irritability.  Lauren Chapman denied any recent outbursts or panic attacks.  Lauren Chapman reported that a struggle was getting a tooth pulled yesterday and feeling sore this morning.  Lauren Chapman reported that a success was looking into a veterinarian program yesterday and deciding to apply.  Lauren Chapman reported that her goal today is to take a nap after group before she goes to work.     Second Therapeutic Activity: Counselor introduced Lauren Chapman, [place] Chaplain to provide  psychoeducation on topic of Grief and Loss with members today.  Lauren Chapman began discussion by checking in with the group about their baseline mood today, general thoughts on what grief means to them and how it has affected them personally in the past.  Lauren Chapman provided information on how the process of grief/loss can differ depending upon one's unique culture, and categories of loss one could experience (i.e. loss of a person, animal, relationship, job, identity, etc).  Lauren Chapman encouraged members to be mindful of how pervasive loss can be, and how to recognize signs which could indicate that this is having an impact on one's overall mental health and wellbeing.  Intervention was effective, as evidenced by Lauren Chapman participating in discussion with speaker on the subject, reporting that she has lost several pets of the years, and can be triggered when she walks by reminders of the deceased.  Lauren Chapman reported that she can be harsh toward herself in comparing her loss to the loss of people, stating "Its probably stupid".  Lauren Chapman was receptive to supportive feedback from chaplain on importance of building a supportive network that is empathetic and supportive of needs during times of grief, regardless of whether a person or animal has passed.    Third Therapeutic Activity: Counselor discussed topic of sleep hygiene today with group.  Counselor defined this as the habits, behaviors and environmental factors that can be adjusted to improve overall sleep quality.  Counselor also discussed how lack of consistent sleep can negatively affect mood and overall mental health.  Counselor provided members with a screening to complete assessing typical barriers one might face in achieving quality sleep at night (i.e. struggling to get up in the  morning, waking up throughout the night, tossing/turning, etc), and inquired about members' perception of sleep hygiene at present.  Counselor offered techniques to members via a virtual handout  which could be implemented in order to improve sleep hygiene, such as sticking to a normal morning/night routine, avoiding using of electronics too close to bedtime, avoiding heavy meals before bed, using a sleep journal to track changes and address anxious thoughts, as well as avoiding naps during the daytime, ensuring proper use of medications if prescribed any by provider(s), and more.  Counselor inquired about changes members intend to make to sleep hygiene based upon information covered today.  Interventions were effective, as evidenced by Lauren Chapman actively participating in discussion on subject, reporting that she is averaging 6-7 hours most nights, and stated "I can fall asleep anytime, anywhere".  Lauren Chapman also completed a sleep assessment, which revealed that she is sleep deprived at present.  Lauren Chapman reported that she plans to improve sleep hygiene over the course of treatment by starting to use her CPAP machine more regularly, develop a normal sleep/wake routine, restrict use of electronics close to bedtime, reduce consumption of caffeine and nicotine at night, spend less time in the bedroom during the daytime, cut back on daytime naps, and get more exercise during the day to help tire her out at bedtime.    Assessment and Plan: Counselor recommends that Lauren Chapman remain in IOP treatment to better manage mental health symptoms, ensure stability and pursue completion of treatment plan goals. Counselor recommends adherence to crisis/safety plan, taking medications as prescribed, and following up with medical professionals if any issues arise.    Follow Up Instructions: Counselor will send Webex link for session tomorrow.  Lauren Chapman was advised to call back or seek an in-person evaluation if the symptoms worsen or if the condition fails to improve as anticipated.   Collaboration of Care:   Medication Management AEB Hillery Jacks, NP or Dr. Park Pope                                          Case Manager AEB Jeri Modena, CNA    Patient/Guardian was advised Release of Information must be obtained prior to any record release in order to collaborate their care with an outside provider. Patient/Guardian was advised if they have not already done so to contact the registration department to sign all necessary forms in order for [place] to release information regarding their care.    Consent: Patient/Guardian gives verbal consent for treatment and assignment of benefits for services provided during this visit. Patient/Guardian expressed understanding and agreed to proceed.   I provided 180 minutes of non-face-to-face time during this encounter.   Noralee Stain, [place], LCAS 04/03/23

## 2023-04-03 NOTE — Therapy (Signed)
 [facility] PARTIAL HOSPITALIZATION PROGRAM [address] [place], [place], [address] Phone: [phone]   Fax:  [phone]  Occupational Therapy Treatment Virtual Visit via Video Note  I connected with Lauren Chapman on 04/03/23 at  8:00 AM EST by a video enabled telemedicine application and verified that I am speaking with the correct person using two identifiers.  Location: Patient: home Provider: office   I discussed the limitations of evaluation and management by telemedicine and the availability of in person appointments. The patient expressed understanding and agreed to proceed.    The patient was advised to call back or seek an in-person evaluation if the symptoms worsen or if the condition fails to improve as anticipated.  I provided 55 minutes of non-face-to-face time during this encounter.   Patient Details  Name: Lauren Chapman MRN: [id number] Date of Birth: 1970-06-17 No data recorded  Encounter Date: 03/29/2023   OT End of Session - 04/03/23 0932     Visit Number 16    Number of Visits 20    Date for OT Re-Evaluation 04/11/23    OT Start Time 1200    OT Stop Time 1255    OT Time Calculation (min) 55 min             Past Medical History:  Diagnosis Date   Allergy    Anxiety    Bipolar affective psychosis (HCC)    Complication of anesthesia    " Acting silly- waving at everyone"-Giddy   Depression    Emphysema lung (HCC)    inhaler prn,  followed by pcp and pulmonologist-- dr [name]   Family history of adverse reaction to anesthesia    Father had rheumatic fever, and died on operating table.   GERD (gastroesophageal reflux disease)    Kidney cysts 01/24/2020   Right Kidney   Migraine    Moderate persistent asthma 09/05/2021   Smokers' cough (HCC)    per pt not productive   SUI (stress urinary incontinence, female)    Tubular adenoma of colon 07/08/2020   Wears glasses     Past Surgical History:  Procedure  Laterality Date   ABDOMINAL HYSTERECTOMY N/A    Phreesia 11/30/2019   CARPAL TUNNEL RELEASE Right 09-12-2007   @[facility]    and GANGLION CYST EXCISION   COLONOSCOPY     CYSTOSCOPY N/A 04/29/2019   Procedure: CYSTOSCOPY;  Surgeon: [name], MD;  Location: [facility];  Service: Gynecology;  Laterality: N/A;   DILATATION & CURETTAGE/HYSTEROSCOPY WITH MYOSURE N/A 11/13/2018   Procedure: DILATATION & CURETTAGE/HYSTEROSCOPY WITH MYOSURE;  Surgeon: [name], MD;  Location: [facility];  Service: Gynecology;  Laterality: N/A;  request 9:00am OR start time in [place] [facility] block requests one hour   DILATION AND CURETTAGE OF UTERUS  02/2019   EYE SURGERY N/A    Phreesia 11/30/2019   GANGLION CYST EXCISION     R wrist   interstem      implant for overactive bladder   KNEE ARTHROSCOPY WITH ANTERIOR CRUCIATE LIGAMENT (ACL) REPAIR Right 10/2015   LAPAROSCOPIC CHOLECYSTECTOMY  1997   LAPAROSCOPIC HYSTERECTOMY Bilateral 04/29/2019   Procedure: HYSTERECTOMY TOTAL LAPAROSCOPIC, BILATERAL SALPINO-OOPHORECTOMY;  Surgeon: [name], MD;  Location: [facility];  Service: Gynecology;  Laterality: Bilateral;   LAPAROSCOPIC TOTAL HYSTERECTOMY     TOOTH EXTRACTION  04/01/2019   tooth removal Bilateral 2022   4 teeth removed   UPPER GASTROINTESTINAL ENDOSCOPY  There were no vitals filed for this visit.   Subjective Assessment - 04/03/23 0931     Currently in Pain? No/denies    Pain Score 0-No pain                  Group Session:  S: Doing better today. Looking forward to IOP and using the skills I've learned in this program.   O: The objective of today's group is to provide a comprehensive understanding of the concept of "motivation" and its role in human behavior and well-being. The content covers various theories of motivation, including intrinsic and extrinsic motivators, and explores the psychological mechanisms that  drive individuals to achieve goals, overcome obstacles, and make decisions. By diving into real-world applications, the group aims to offer actionable strategies for enhancing motivation in different life domains, such as work, relationships, and personal growth.  Utilizing a multi-disciplinary approach, this group integrates insights from psychology, neuroscience, and behavioral economics to present a holistic view of motivation. The objective is not only to educate the audience about the complexities and driving forces behind motivation but also to equip them with practical tools and techniques to improve their own motivation levels. By the end of this multi-day group, patient's should have a well-rounded understanding of what motivates human actions and how to harness this knowledge for personal and professional betterment.   A:  The patient demonstrates a high level of engagement during the session, actively participating in discussions about motivation theories and their applicability to their own life. They show keen interest in learning new strategies to improve their motivation and even offer examples from their own experiences that align with the theories presented. Their level of self-awareness and willingness to invest in self-improvement suggest that they are well-positioned to benefit from the practical tools and techniques discussed. The patient's ability to articulate their goals and challenges further supports the likelihood of successfully implementing the strategies presented.  OCCUPATIONAL THERAPY DISCHARGE SUMMARY  Visits from Start of Care: 16  Current functional level related to goals / functional outcomes: Pt has met all OT goals and is ready for DC at this time.    Remaining deficits: There are no remaining deficits at this time.     Plan: Patient agrees to discharge.                      OT Education - 04/03/23 0931     Education Details Motivation 3     Person(s) Educated Patient              OT Short Term Goals - 03/12/23 1025       OT SHORT TERM GOAL #1   Title Patient will be educated on strategies to improve psychosocial skills needed to participate fully in all daily, work, and leisure activities.    Time 4    Period Weeks    Status On-going    Target Date 04/11/23      OT SHORT TERM GOAL #2   Title Pt will apply psychosocial skills and coping mechanisms to daily activities in order to function independently and reintegrate into community dwelling    Status On-going      OT SHORT TERM GOAL #3   Title Pt will choose and/or engage in 1-3 socially engaging leisure activities to improve social participation skills upon reintegrating into community    Status On-going  Plan - 04/03/23 0932     Psychosocial Skills Habits;Routines and Behaviors;Interpersonal Interaction;Coping Strategies             Patient will benefit from skilled therapeutic intervention in order to improve the following deficits and impairments:       Psychosocial Skills: Habits, Routines and Behaviors, Interpersonal Interaction, Coping Strategies   Visit Diagnosis: Difficulty coping    Problem List Patient Active Problem List   Diagnosis Date Noted   Schizoaffective disorder (HCC) 03/01/2023   Poorly controlled mild persistent asthma 09/05/2021   Syncope 08/02/2021   Tobacco abuse 08/02/2021   Vaginal candidiasis 08/02/2021   CAP (community acquired pneumonia) 07/18/2021   OSA (obstructive sleep apnea) 07/18/2021   Hypokalemia 07/18/2021   Depression 03/24/2020   Bilateral low back pain with left-sided sciatica 03/24/2020   Left lumbar radiculopathy 02/25/2020   Absence of bladder continence 02/25/2020   History of postmenopausal bleeding 04/29/2019   Polypharmacy 04/03/2019   Emphysema, unspecified (HCC) 05/17/2018   Weakness 01/25/2017   Chronic migraine w/o aura w/o status migrainosus, not  intractable 12/22/2014   Encounter for other general counseling or advice on contraception 09/02/2014   Bipolar disorder (HCC) 10/12/2011   Anxiety 10/12/2011   GERD (gastroesophageal reflux disease) 10/12/2011    Ted Mcalpine, OT 04/03/2023, 9:33 AM  Kerrin Champagne, OT   [facility] HOSPITALIZATION PROGRAM [address] [place], [place], [address] Phone: [phone]   Fax:  [phone]  Name: Lauren Chapman MRN: [id number] Date of Birth: Jan 04, 1971

## 2023-04-04 ENCOUNTER — Other Ambulatory Visit (HOSPITAL_COMMUNITY): Admitting: Licensed Clinical Social Worker

## 2023-04-04 DIAGNOSIS — F411 Generalized anxiety disorder: Secondary | ICD-10-CM

## 2023-04-04 DIAGNOSIS — F333 Major depressive disorder, recurrent, severe with psychotic symptoms: Secondary | ICD-10-CM | POA: Diagnosis not present

## 2023-04-04 NOTE — Progress Notes (Signed)
 Virtual Visit via Video Note   I connected with Lauren Chapman on 04/04/23 at  9:00 AM EDT by a video enabled telemedicine application and verified that I am speaking with the correct person using two identifiers.   At orientation to the IOP program, Case Manager discussed the limitations of evaluation and management by telemedicine and the availability of in person appointments. The patient expressed understanding and agreed to proceed with virtual visits throughout the duration of the program.   Location:  Patient: Patient Home Provider: OPT BH Office   History of Present Illness: MDD and GAD   Observations/Objective: Check In: Case Manager checked in with all participants to review discharge dates, insurance authorizations, work-related documents and needs from the treatment team regarding medications. Lauren Chapman stated needs and engaged in discussion.    Initial Therapeutic Activity: Counselor facilitated a check-in with Lauren Chapman to assess for safety, sobriety and medication compliance.  Counselor also inquired about Lauren Chapman's current emotional ratings, as well as any significant changes in thoughts, feelings or behavior since previous check in.  Lauren Chapman presented for session on time and was alert, oriented x5, with no evidence or self-report of active SI/HI or A/V H.  Lauren Chapman reported compliance with medication and denied use of alcohol or illicit substances.  Lauren Chapman reported scores of 4/10 for depression, 8/10 for anxiety, and 0/10 for anger/irritability.  Lauren Chapman denied any recent outbursts or panic attacks.  Lauren Chapman reported that a success was making it to work yesterday to get some hours.  Lauren Chapman reported that a struggle was almost having a panic attack last night on the job when she was overwhelmed by customer demands.  She reported that she took a timeout and did some deep breathing to calm down.  Lauren Chapman reported that her goal today is to go to work again later in the evening.          Second  Therapeutic Activity: Counselor introduced topic of building a social support network today.  Counselor explained how this can be defined as having a having a group of healthy people in one's life you can talk to, spend time with, and get help from to improve both mental and physical health.  Counselor noted that some barriers can make it difficult to connect with other people, including the presence of anxiety or depression, or moving to an unfamiliar area.  Group members were asked to assess the current state of their support network, and identify ways that this could be improved.  Tips were given on how to address previously noted barriers, such as strengthening social skills, using relaxation techniques to reduce anxiety, scheduling social time each week, and/or exploring social events nearby which could increase chances of meeting new supports.  Members were also encouraged to consider getting closer to people they already know through suggestions such as outreaching someone by text, email or phone call if they haven't spoken in awhile, doing something nice for a friend/family member unexpectedly, and/or inviting someone over for a game/movie/dinner night.  Intervention was effective, as evidenced by Lauren Davenport actively participating in discussion on the subject, and reporting that she has a very 'sparse' support network, and would benefit from meeting new people.  Lauren Chapman stated "I'm very shy and have a hard time making friends".  Lauren Chapman reported that this issue is further complicated by the upcoming closing of her job, which will mean that she is less likely to see coworkers she socialized with.  She reported that her goal will be to use therapy  as an opportunity to build social skills, improve sense of self-worth, and take advantage of opportunities to meet new people by attending neighborhood get-togethers, sewing classes, and volunteering at the [employer].     Assessment and Plan: Counselor recommends  that Lauren Chapman remain in IOP treatment to better manage mental health symptoms, ensure stability and pursue completion of treatment plan goals. Counselor recommends adherence to crisis/safety plan, taking medications as prescribed, and following up with medical professionals if any issues arise.    Follow Up Instructions: Counselor will send Webex link for session tomorrow.  Lauren Chapman was advised to call back or seek an in-person evaluation if the symptoms worsen or if the condition fails to improve as anticipated.   Collaboration of Care:   Medication Management AEB Hillery Jacks, NP or Dr. Park Pope                                          Case Manager AEB Jeri Modena, CNA    Patient/Guardian was advised Release of Information must be obtained prior to any record release in order to collaborate their care with an outside provider. Patient/Guardian was advised if they have not already done so to contact the registration department to sign all necessary forms in order for [place] to release information regarding their care.    Consent: Patient/Guardian gives verbal consent for treatment and assignment of benefits for services provided during this visit. Patient/Guardian expressed understanding and agreed to proceed.   I provided 180 minutes of non-face-to-face time during this encounter.   Noralee Stain, LCSW, LCAS 04/04/23

## 2023-04-05 ENCOUNTER — Other Ambulatory Visit (HOSPITAL_COMMUNITY): Admitting: Psychiatry

## 2023-04-05 ENCOUNTER — Telehealth (HOSPITAL_COMMUNITY): Payer: Self-pay | Admitting: Psychiatry

## 2023-04-06 ENCOUNTER — Other Ambulatory Visit (HOSPITAL_COMMUNITY): Admitting: Licensed Clinical Social Worker

## 2023-04-06 DIAGNOSIS — F411 Generalized anxiety disorder: Secondary | ICD-10-CM

## 2023-04-06 DIAGNOSIS — F333 Major depressive disorder, recurrent, severe with psychotic symptoms: Secondary | ICD-10-CM

## 2023-04-06 NOTE — Progress Notes (Signed)
 Virtual Visit via Video Note   I connected with Lauren Chapman on 04/06/23 at  9:00 AM EDT by a video enabled telemedicine application and verified that I am speaking with the correct person using two identifiers.   At orientation to the IOP program, Case Manager discussed the limitations of evaluation and management by telemedicine and the availability of in person appointments. The patient expressed understanding and agreed to proceed with virtual visits throughout the duration of the program.   Location:  Patient: Patient Home Provider: Home Office   History of Present Illness: MDD and GAD   Observations/Objective: Check In: Case Manager checked in with all participants to review discharge dates, insurance authorizations, work-related documents and needs from the treatment team regarding medications. Lauren Chapman stated needs and engaged in discussion.    Initial Therapeutic Activity: Counselor facilitated a check-in with Lauren Chapman to assess for safety, sobriety and medication compliance.  Counselor also inquired about Lauren Chapman's current emotional ratings, as well as any significant changes in thoughts, feelings or behavior since previous check in.  Lauren Chapman presented for session on time and was alert, oriented x5, with no evidence or self-report of active SI/HI or A/V H.  Lauren Chapman reported compliance with medication and denied use of alcohol or illicit substances.  Lauren Chapman reported scores of 2/10 for depression, 6/10 for anxiety, and 0/10 for anger/irritability.  Lauren Chapman denied any recent outbursts or panic attacks.  Lauren Chapman reported that a struggle was having a dentist appointment cancelled due to insurance issues.  Lauren Chapman reported that a success was attending an appointment with her psychiatrist yesterday, although this required her to miss group.  Lauren Chapman reported that her goal this weekend is to work every day at her job.  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            Second Therapeutic Activity: Counselor discussed topic of distress tolerance today with group.  Counselor shared virtual handout with members that offered a DBT approach represented by the acronym ACCEPTS, and outlined strategies for distracting oneself from distressing emotions, allowing appropriate time for these emotions to lesson in intensity and eventually fade away.  Strategies offered included engaging in positive activities, contributing to the wellbeing of others, comparing one's present situation to a previously difficult one to highlight resilience, using mental imagery, and physical grounding.  Counselor assisted members in creating their own realistic ACCEPTS plan for tackling a distressing emotion of their choice.  Intervention was effective, as evidenced by Lauren Chapman participating in creation of the plan, choosing worthless as her emotion of focus, and identifying several helpful approaches for distraction, such as writing in her journal, doing a cross stich, calling a friend to check in on how they are doing, volunteering at the animal shelter, reflecting on early struggles in her recovery and how far she has come since then, listening to energizing music, do some dishes, take a walk, visualizing a past family vacation, or alphabetically organizing all of her books.  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  Lauren Chapman provided information on history of her local agency, mission statement, and the variety of unique services offered which group members might find beneficial to engage in, including both virtual and in-person support groups, as well as peer support program for mentoring.  Lauren Chapman offered time to answer member's questions regarding services and encouraged them to consider utilizing these  services to assist in working towards their  individual wellness goals.  Intervention was effective, as evidenced by Lauren Chapman participating in discussion with speaker on the subject, reporting that she would be interested in some of the services mentioned today, as long as her insurance is covered.      Assessment and Plan: Counselor recommends that Lauren Chapman remain in IOP treatment to better manage mental health symptoms, ensure stability and pursue completion of treatment plan goals. Counselor recommends adherence to crisis/safety plan, taking medications as prescribed, and following up with medical professionals if any issues arise.    Follow Up Instructions: Counselor will send Webex link for session tomorrow.  Lauren Chapman was advised to call back or seek an in-person evaluation if the symptoms worsen or if the condition fails to improve as anticipated.   Collaboration of Care:   Medication Management AEB Hillery Jacks, NP or Dr. Park Pope                                          Case Manager AEB Jeri Modena, CNA    Patient/Guardian was advised Release of Information must be obtained prior to any record release in order to collaborate their care with an outside provider. Patient/Guardian was advised if they have not already done so to contact the registration department to sign all necessary forms in order for [place] to release information regarding their care.    Consent: Patient/Guardian gives verbal consent for treatment and assignment of benefits for services provided during this visit. Patient/Guardian expressed understanding and agreed to proceed.   I provided 180 minutes of non-face-to-face time during this encounter.   Noralee Stain, [place], LCAS 04/06/23

## 2023-04-09 ENCOUNTER — Other Ambulatory Visit (HOSPITAL_COMMUNITY): Admitting: Licensed Clinical Social Worker

## 2023-04-09 DIAGNOSIS — F333 Major depressive disorder, recurrent, severe with psychotic symptoms: Secondary | ICD-10-CM | POA: Diagnosis not present

## 2023-04-09 DIAGNOSIS — F411 Generalized anxiety disorder: Secondary | ICD-10-CM

## 2023-04-09 NOTE — Progress Notes (Signed)
 Virtual Visit via Video Note   I connected with Dois Davenport L. Formoso on 04/09/23 at  9:00 AM EDT by a video enabled telemedicine application and verified that I am speaking with the correct person using two identifiers.   At orientation to the IOP program, Case Manager discussed the limitations of evaluation and management by telemedicine and the availability of in person appointments. The patient expressed understanding and agreed to proceed with virtual visits throughout the duration of the program.   Location:  Patient: Patient Home Provider: [facility]   History of Present Illness: MDD   Observations/Objective: Check In: Case Manager checked in with all participants to review discharge dates, insurance authorizations, work-related documents and needs from the treatment team regarding medications. Ceasia stated needs and engaged in discussion.    Initial Therapeutic Activity: Counselor facilitated a check-in with Jalexis to assess for safety, sobriety and medication compliance.  Counselor also inquired about Jinx's current emotional ratings, as well as any significant changes in thoughts, feelings or behavior since previous check in.  Latima presented for session on time and was alert, oriented x5, with no evidence or self-report of active SI/HI or A/V H.  Nalia reported compliance with medication and denied use of alcohol or illicit substances.  Kriya reported scores of 4/10 for depression, 8/10 for anxiety, and 0/10 for anger/irritability.  Amberley denied any recent panic attacks.  Myalee reported that a struggle was working through the weekend, since her job stayed busy and the weather was stormy. She reported that she also had outbursts toward customers and experienced passive SI as a result.  Joscelyne reported that she did not develop intent or plan, but could contract for safety, agreeing to go to the behavioral health hospital with assistance from family if condition worsens and she needs  intervention by professionals.  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      Second Therapeutic Activity: Counselor introduced topic of grounding skills today.  Counselor defined these as simple strategies one can use to help detach from difficult thoughts or feelings temporarily by focusing on something else.  Counselor noted that grounding will not solve the problem at hand, but can provide the practitioner with time to regain control over their thoughts and/or feelings and prevent the situation from getting worse (i.e. interrupting a panic attack).  Counselor divided these into three categories (mental, physical, and soothing) and then provided examples of each which group members could practice during session.  Some of these included describing one's environment in detail or playing a categories game with oneself for mental category, taking a hot bath/shower, stretching, or carrying a grounding object for physical category, and saying kind statements, or visualizing people one cares about for soothing category.  Counselor inquired about which techniques members have used with success in the past, or will commit to learning, practicing, and applying now to improve coping abilities.  Intervention was effective, as evidenced by Dois Davenport participating in discussion on the subject, trying out several of the techniques during session, and expressing interest in adding several to her available coping skills, such as describing her environment in detail, playing a categories game involving listing different cat breeds, describing an everyday activity in great detail, visualizing herself relaxing at the beach, reading a book on historical fiction, reciting the alphabet backwards, running her hand on her phone case, playing with a fidget toy, practicing deep breathing, telling herself kind statements like "You're  awesome", or singing a Acupuncturist that is  empowering or has special meaning to her.        Assessment and Plan: Counselor recommends that Caran remain in IOP treatment to better manage mental health symptoms, ensure stability and pursue completion of treatment plan goals. Counselor recommends adherence to crisis/safety plan, taking medications as prescribed, and following up with medical professionals if any issues arise.    Follow Up Instructions: Counselor will send Webex link for session tomorrow.  Chenelle was advised to call back or seek an in-person evaluation if the symptoms worsen or if the condition fails to improve as anticipated.   Collaboration of Care:   Medication Management AEB Hillery Jacks, NP or Dr. Park Pope                                          Case Manager AEB Jeri Modena, CNA    Patient/Guardian was advised Release of Information must be obtained prior to any record release in order to collaborate their care with an outside provider. Patient/Guardian was advised if they have not already done so to contact the registration department to sign all necessary forms in order for [place] to release information regarding their care.    Consent: Patient/Guardian gives verbal consent for treatment and assignment of benefits for services provided during this visit. Patient/Guardian expressed understanding and agreed to proceed.   I provided 180 minutes of non-face-to-face time during this encounter.   Noralee Stain, [place], LCAS 04/09/23

## 2023-04-10 ENCOUNTER — Other Ambulatory Visit (HOSPITAL_COMMUNITY): Admitting: Psychiatry

## 2023-04-10 DIAGNOSIS — F333 Major depressive disorder, recurrent, severe with psychotic symptoms: Secondary | ICD-10-CM

## 2023-04-10 DIAGNOSIS — F411 Generalized anxiety disorder: Secondary | ICD-10-CM

## 2023-04-10 NOTE — Progress Notes (Signed)
 Virtual Visit via Video Note   I connected with Lauren Chapman on 04/10/23 at  9:00 AM EDT by a video enabled telemedicine application and verified that I am speaking with the correct person using two identifiers.   At orientation to the IOP program, Case Manager discussed the limitations of evaluation and management by telemedicine and the availability of in person appointments. The patient expressed understanding and agreed to proceed with virtual visits throughout the duration of the program.   Location:  Patient: Patient Home Provider: [facility]   History of Present Illness: MDD and GAD   Observations/Objective: Check In: Case Manager checked in with all participants to review discharge dates, insurance authorizations, work-related documents and needs from the treatment team regarding medications. Lauren Chapman stated needs and engaged in discussion.    Initial Therapeutic Activity: Counselor facilitated a check-in with Lauren Chapman to assess for safety, sobriety and medication compliance.  Counselor also inquired about Lauren Chapman's current emotional ratings, as well as any significant changes in thoughts, feelings or behavior since previous check in.  Lauren Chapman presented for session on time and was alert, oriented x5, with no evidence or self-report of active SI/HI or A/V H.  Lauren Chapman reported compliance with medication and denied use of alcohol or illicit substances.  Lauren Chapman reported scores of 3/10 for depression, 7/10 for anxiety, and 0/10 for anger/irritability.  Lauren Chapman denied any recent outbursts or panic attacks.  Lauren Chapman reported that a struggle was getting out of bed yesterday, as she took a long nap after group.  Lauren Chapman reported that her goal today is to clean her room and then go to work.          Second Therapeutic Activity: Counselor introduced [employer], [place] Chaplain to provide psychoeducation on topic of Grief and Loss with members today.  Lauren Chapman began discussion by checking in with the  group about their baseline mood today, general thoughts on what grief means to them and how it has affected them personally in the past.  Lauren Chapman provided information on how the process of grief/loss can differ depending upon one's unique culture, and categories of loss one could experience (i.e. loss of a person, animal, relationship, job, identity, etc).  Lauren Chapman encouraged members to be mindful of how pervasive loss can be, and how to recognize signs which could indicate that this is having an impact on one's overall mental health and wellbeing.  Intervention was effective, as evidenced by Lauren Davenport participating in discussion with speaker on the subject, reporting that since the last speaking session, she has continued reflecting upon the loss of a pet, and how some people like her mother have not been supportive of her grieving.  Lauren Chapman stated "She has said things like 'it's a cat, get over it'".  Lauren Chapman was receptive to supportive feedback from chaplain validating her feelings, and encouraging her to be mindful of setting boundaries to reduce negative feedback from supports that cannot empathize with her experience and may actually complicate grieving process.    Third Therapeutic Activity:  Psycho-educational portion of group was provided by Lauren Chapman. Lauren Chapman, Interior and spatial designer of community education and engagement with [employer] [place].  Lauren Chapman provided information on history of her local agency, mission statement, and the variety of unique services offered which group members might find beneficial to engage in, including free legal guidance, assistance with job hunting, Publishing rights manager counseling, and workshops.   Lauren Chapman offered time to answer member's questions regarding services and encouraged them to consider utilizing these  services to assist in working towards their individual wellness goals.  Intervention was effective, as evidenced by Lauren Davenport participating in discussion with speaker on the  subject, reporting that she would be interested in participating in a craft class that is offered, since this is a personal hobby of hers she has been trying to do more often, and could help her meet new friends.    Assessment and Plan: Counselor recommends that Lauren Chapman remain in IOP treatment to better manage mental health symptoms, ensure stability and pursue completion of treatment plan goals. Counselor recommends adherence to crisis/safety plan, taking medications as prescribed, and following up with medical professionals if any issues arise.    Follow Up Instructions: Counselor will send Webex link for session tomorrow.  Lauren Chapman was advised to call back or seek an in-person evaluation if the symptoms worsen or if the condition fails to improve as anticipated.   Collaboration of Care:   Medication Management AEB Lauren Jacks, NP or Lauren Chapman                                          Case Manager AEB Lauren Modena, Lauren Chapman    Patient/Guardian was advised Release of Information must be obtained prior to any record release in order to collaborate their care with an outside provider. Patient/Guardian was advised if they have not already done so to contact the registration department to sign all necessary forms in order for [place] to release information regarding their care.    Consent: Patient/Guardian gives verbal consent for treatment and assignment of benefits for services provided during this visit. Patient/Guardian expressed understanding and agreed to proceed.   I provided 180 minutes of non-face-to-face time during this encounter.   Noralee Stain, LCSW, LCAS 04/10/23

## 2023-04-11 ENCOUNTER — Other Ambulatory Visit (HOSPITAL_COMMUNITY): Admitting: Licensed Clinical Social Worker

## 2023-04-11 DIAGNOSIS — F411 Generalized anxiety disorder: Secondary | ICD-10-CM

## 2023-04-11 DIAGNOSIS — F333 Major depressive disorder, recurrent, severe with psychotic symptoms: Secondary | ICD-10-CM | POA: Diagnosis not present

## 2023-04-11 NOTE — Progress Notes (Signed)
 Virtual Visit via Video Note   I connected with Lauren Chapman on 04/11/23 at  9:00 AM EDT by a video enabled telemedicine application and verified that I am speaking with the correct person using two identifiers.   At orientation to the IOP program, Case Manager discussed the limitations of evaluation and management by telemedicine and the availability of in person appointments. The patient expressed understanding and agreed to proceed with virtual visits throughout the duration of the program.   Location:  Patient: Patient Home Provider: [facility]   History of Present Illness: MDD and GAD   Observations/Objective: Check In: Case Manager checked in with all participants to review discharge dates, insurance authorizations, work-related documents and needs from the treatment team regarding medications. Lauren Chapman stated needs and engaged in discussion.    Initial Therapeutic Activity: Counselor facilitated a check-in with Lauren Chapman to assess for safety, sobriety and medication compliance.  Counselor also inquired about Dulse's current emotional ratings, as well as any significant changes in thoughts, feelings or behavior since previous check in.  Lauren Chapman presented for session on time and was alert, oriented x5, with no evidence or self-report of active SI/HI or A/V H.  Lauren Chapman reported compliance with medication and denied use of alcohol or illicit substances.  Lauren Chapman reported scores of 1/10 for depression, 6/10 for anxiety, and 0/10 for anger/irritability.  Lauren Chapman denied any recent outbursts or panic attacks.  Lauren Chapman reported that a struggle was feeling exhausted after her work shift yesterday, since she was on her feet the whole sick.  Lauren Chapman reported that her goal today is to attend a dentist appointment to get some work done this morning.  She informed care team that she would have to leave group at break in order to avoid missing appointment.     Second Therapeutic Activity: Counselor introduced  Lauren Chapman, [employer] Pharmacist, to provide psychoeducation on topic of medication compliance with members today.  Lauren Chapman provided psychoeducation on classes of medications such as antidepressants, antipsychotics, what symptoms they are intended to treat, and any side effects one might encounter while on a particular prescription.  Time was allowed for clients to ask any questions they might have of [facility] regarding this specialty.  Intervention was effective, as evidenced by Lauren Davenport participating in discussion with speaker on the subject, reporting that when she was admitted to the hospital recently, she was taken off all previous medication, and has now started 3 new ones.  Lauren Chapman reported that she feels like these are helping with symptom reduction, but upon waking she experiences high anxiety and depression.  Lauren Chapman was receptive to feedback from pharmacist on changes she can make to her regimen to aid in reduction of morning time severity.    Third Therapeutic Activity:  Counselor introduced Lauren Chapman, [employer], to provide psychoeducation on topic of nutrition with members today.  Lauren Chapman virtually shared a comprehensive PowerPoint presentation to guide discussion, which featured the various components of healthy living that influence one's wellbeing, including practicing mindfulness, staying physically active, calm in mood, well-rested, and more.  Lauren Chapman explained how good nutrition reinforces positive physical and mental health, and shared a video which explained how food intake affects brain functioning in particular.  Lauren Chapman provided advice on how to adjust diet in order to promote wellbeing during course of treatment, including achieving balanced daily intake along with regular exercise.  Lauren Chapman offered the 'plate method' as a tool for proper distribution of protein, grains, starches, vegetables, fruit, and low calorie drink, in addition  to concept of 'mindful eating', and covering current Dietary Guidelines  for Americans for more tips.  Lauren Chapman inquired about changes members would like to make to their nutrition in order to increase overall wellbeing based upon information shared today, and time was allowed to ask any questions they might have of Lauren Chapman regarding her specialty.  Intervention effectiveness could not be measured, as client had to leave group early in order to attend dentist appointment, and could not participate.    Assessment and Plan: Counselor recommends that Lauren Chapman remain in IOP treatment to better manage mental health symptoms, ensure stability and pursue completion of treatment plan goals. Counselor recommends adherence to crisis/safety plan, taking medications as prescribed, and following up with medical professionals if any issues arise.    Follow Up Instructions: Counselor will send Webex link for session tomorrow.  Lauren Chapman was advised to call back or seek an in-person evaluation if the symptoms worsen or if the condition fails to improve as anticipated.   Collaboration of Care:   Medication Management AEB Hillery Jacks, NP or Dr. Park Pope                                          Case Manager AEB Jeri Modena, CNA    Patient/Guardian was advised Release of Information must be obtained prior to any record release in order to collaborate their care with an outside provider. Patient/Guardian was advised if they have not already done so to contact the registration department to sign all necessary forms in order for [place] to release information regarding their care.    Consent: Patient/Guardian gives verbal consent for treatment and assignment of benefits for services provided during this visit. Patient/Guardian expressed understanding and agreed to proceed.   I provided 90 minutes of non-face-to-face time during this encounter.   Noralee Stain, [place], LCAS 04/11/23

## 2023-04-12 ENCOUNTER — Other Ambulatory Visit (HOSPITAL_COMMUNITY): Admitting: Licensed Clinical Social Worker

## 2023-04-12 DIAGNOSIS — F333 Major depressive disorder, recurrent, severe with psychotic symptoms: Secondary | ICD-10-CM

## 2023-04-12 DIAGNOSIS — F411 Generalized anxiety disorder: Secondary | ICD-10-CM

## 2023-04-12 NOTE — Progress Notes (Signed)
 Virtual Visit via Video Note   I connected with Lauren Chapman on 04/12/23 at  9:00 AM EDT by a video enabled telemedicine application and verified that I am speaking with the correct person using two identifiers.   At orientation to the IOP program, Case Manager discussed the limitations of evaluation and management by telemedicine and the availability of in person appointments. The patient expressed understanding and agreed to proceed with virtual visits throughout the duration of the program.   Location:  Patient: [facility] Home Provider: [facility]   History of Present Illness: MDD and GAD   Observations/Objective: Check In: Case Manager checked in with all participants to review discharge dates, insurance authorizations, work-related documents and needs from the treatment team regarding medications. Lauren Chapman stated needs and engaged in discussion.    Initial Therapeutic Activity: Counselor facilitated a check-in with Lauren Chapman to assess for safety, sobriety and medication compliance.  Counselor also inquired about Lauren Chapman's current emotional ratings, as well as any significant changes in thoughts, feelings or behavior since previous check in.  Lauren Chapman presented for session on time and was alert, oriented x5, with no evidence or self-report of active SI/HI or A/V H.  Lauren Chapman reported compliance with medication and denied use of alcohol or illicit substances.  Lauren Chapman reported scores of 2/10 for depression, 6/10 for anxiety, and 0/10 for anger/irritability.  Lauren Chapman denied any recent outbursts or panic attacks.  Lauren Chapman reported that a struggle was missing the second half of group yesterday due to a dentist appointment.  Lauren Chapman reported that the procedure was successful, although she feels sore today.  Lauren Chapman reported that her goal today is to go to work after group finishes.          Second Therapeutic Activity: Counselor introduced topic of self-care today.  Counselor explained how this can be defined  as the things one does to maintain good health and improve well-being.  Counselor provided members with a self-care assessment form to complete.  This handout featured various sub-categories of self-care, including physical, psychological/emotional, social, spiritual, and professional.  Members were asked to rank their engagement in the activities listed for each dimension on a scale of 1-3, with 1 indicating 'Poor', 2 indicating 'Ok', and 3 indicating 'Well'.  Counselor invited members to share results of their assessment, and inquired about which areas of self-care they are doing well in, as well as areas that require attention, and how they plan to begin addressing this during treatment.  Intervention was effective, as evidenced by Lauren Davenport successfully completing initial 2 sections of assessment and actively engaging in discussion on subject, reporting that she is excelling in areas such as getting enough sleep, and going to preventative medical appointments, but would benefit from focusing more on areas such as getting exercise, eating healthy foods regularly, and participating in fun activities.  Lauren Chapman reported that she would work to improve self-care deficits by taking walks more frequently for exercise, setting aside more time for hobbies like cross stitching and crafting, consider joining a dancing class or book club, and adjusting diet to include more fruits or vegetables.    Assessment and Plan: Counselor recommends that Lauren Chapman remain in IOP treatment to better manage mental health symptoms, ensure stability and pursue completion of treatment plan goals. Counselor recommends adherence to crisis/safety plan, taking medications as prescribed, and following up with medical professionals if any issues arise.    Follow Up Instructions: Counselor will send Webex link for session tomorrow.  Lauren Chapman was advised to call  back or seek an in-person evaluation if the symptoms worsen or if the condition fails to  improve as anticipated.   Collaboration of Care:   Medication Management AEB Hillery Jacks, NP or Dr. Park Pope                                          Case Manager AEB Jeri Modena, CNA    Patient/Guardian was advised Release of Information must be obtained prior to any record release in order to collaborate their care with an outside provider. Patient/Guardian was advised if they have not already done so to contact the registration department to sign all necessary forms in order for [place] to release information regarding their care.    Consent: Patient/Guardian gives verbal consent for treatment and assignment of benefits for services provided during this visit. Patient/Guardian expressed understanding and agreed to proceed.   I provided 180 minutes of non-face-to-face time during this encounter.   Noralee Stain, LCSW, LCAS 04/12/23

## 2023-04-13 ENCOUNTER — Other Ambulatory Visit (HOSPITAL_COMMUNITY): Admitting: Licensed Clinical Social Worker

## 2023-04-13 DIAGNOSIS — F333 Major depressive disorder, recurrent, severe with psychotic symptoms: Secondary | ICD-10-CM | POA: Diagnosis not present

## 2023-04-13 DIAGNOSIS — F411 Generalized anxiety disorder: Secondary | ICD-10-CM

## 2023-04-13 NOTE — Progress Notes (Signed)
 Virtual Visit via Video Note   I connected with Dois Davenport L. Marian on 04/13/23 at  9:00 AM EDT by a video enabled telemedicine application and verified that I am speaking with the correct person using two identifiers.   At orientation to the IOP program, Case Manager discussed the limitations of evaluation and management by telemedicine and the availability of in person appointments. The patient expressed understanding and agreed to proceed with virtual visits throughout the duration of the program.   Location:  Patient: Patient Home Provider: Home Office   History of Present Illness: MDD and GAD   Observations/Objective: Check In: Case Manager checked in with all participants to review discharge dates, insurance authorizations, work-related documents and needs from the treatment team regarding medications. Kennedee stated needs and engaged in discussion.    Initial Therapeutic Activity: Counselor facilitated a check-in with Skyah to assess for safety, sobriety and medication compliance.  Counselor also inquired about Preslynn's current emotional ratings, as well as any significant changes in thoughts, feelings or behavior since previous check in.  Taraji presented for session on time and was alert, oriented x5, with no evidence or self-report of active SI/HI or A/V H.  Ellaina reported compliance with medication and denied use of alcohol or illicit substances.  Keerat reported scores of 4/10 for depression, 6/10 for anxiety, and 2/10 for anger/irritability.  Linder denied any recent outbursts or panic attacks.  Lynn reported that a struggle was learning that her mother has pneumonia and is in the hospital.  Makinna reported that a success was learning about a new job opportunity from a friend, which could help in her transition from current stressful job.  Celine reported that her goal today is to visit her mother to check in on her condition.     Second Therapeutic Activity: Counselor covered topic of  core beliefs with group today.  Counselor virtually shared a handout on the subject, which explained how everyone looks at the world differently, and two people can have the same experience, but have different interpretations of what happened.  Members were encouraged to think of these like sunglasses with different "shades" influencing perception towards positive or negative outcomes.  Examples of negative core beliefs were provided, such as "I'm unlovable", "I'm not good enough", and "I'm a bad person".  Members were asked to share which one(s) they could relate to, and then identify evidence which contradicts these beliefs.  Counselor also provided psychoeducation on positive affirmations today.  Counselor explained how these are positive statements which can be spoken out loud or recited mentally to challenge negative thoughts and/or core beliefs to improve mood and outlook each day.  Counselor shared a comprehensive list of affirmations virtually to members with different categories, including ones for health, confidence, success, and happiness.  Counselor invited members to look through this list and identify any which resonated with them, and practice saying them out loud with sincerity.  Intervention was effective, as evidenced by Dois Davenport successfully participating in discussion on the subject and reporting that she could relate to several negative core beliefs listed on the handout, such as "I am a failure", "I'm not worthy", "Nothing ever goes right", and "I'll end up alone".  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  Dois Davenport  stated "When you grow up feeling like you're not worthy enough, that you can't contribute anything, it comes from my family.  Its hard to  have self-esteem and avoid addictive behavior."  Trinh also reported that she liked several of the positive affirmations listed, such as "I matter and what I have to offer this world also matters", and "I see perfection in both my virtues and flaws".      Assessment and Plan: Counselor recommends that Derra remain in IOP treatment to better manage mental health symptoms, ensure stability and pursue completion of treatment plan goals. Counselor recommends adherence to crisis/safety plan, taking medications as prescribed, and following up with medical professionals if any issues arise.    Follow Up Instructions: Counselor will send Webex link for session tomorrow.  Chester was advised to call back or seek an in-person evaluation if the symptoms worsen or if the condition fails to improve as anticipated.   Collaboration of Care:   Medication Management AEB Hillery Jacks, NP or Dr. Park Pope                                          Case Manager AEB Jeri Modena, CNA    Patient/Guardian was advised Release of Information must be obtained prior to any record release in order to collaborate their care with an outside provider. Patient/Guardian was advised if they have not already done so to contact the registration department to sign all necessary forms in order for [place] to release information regarding their care.    Consent: Patient/Guardian gives verbal consent for treatment and assignment of benefits for services provided during this visit. Patient/Guardian expressed understanding and agreed to proceed.   I provided 180 minutes of non-face-to-face time during this encounter.   Noralee Stain, LCSW, LCAS 04/13/23

## 2023-04-16 ENCOUNTER — Other Ambulatory Visit (HOSPITAL_COMMUNITY): Admitting: Licensed Clinical Social Worker

## 2023-04-16 DIAGNOSIS — F333 Major depressive disorder, recurrent, severe with psychotic symptoms: Secondary | ICD-10-CM | POA: Diagnosis not present

## 2023-04-16 DIAGNOSIS — F411 Generalized anxiety disorder: Secondary | ICD-10-CM

## 2023-04-16 MED ORDER — CITALOPRAM HYDROBROMIDE 40 MG PO TABS: 40.0000 mg | ORAL_TABLET | Freq: Every day | ORAL | 0 refills | Status: AC

## 2023-04-16 NOTE — Progress Notes (Signed)
 Virtual Visit via Video Note   I connected with Dois Davenport L. Kahrs on 04/16/23 at  9:00 AM EDT by a video enabled telemedicine application and verified that I am speaking with the correct person using two identifiers.   At orientation to the IOP program, Case Manager discussed the limitations of evaluation and management by telemedicine and the availability of in person appointments. The patient expressed understanding and agreed to proceed with virtual visits throughout the duration of the program.   Location:  Patient: Patient Home Provider: [facility]   History of Present Illness: MDD and GAD   Observations/Objective: Check In: Case Manager checked in with all participants to review discharge dates, insurance authorizations, work-related documents and needs from the treatment team regarding medications. Chiara stated needs and engaged in discussion.    Initial Therapeutic Activity: Counselor facilitated a check-in with Tashira to assess for safety, sobriety and medication compliance.  Counselor also inquired about Assata's current emotional ratings, as well as any significant changes in thoughts, feelings or behavior since previous check in.  Addy presented for session on time and was alert, oriented x5, with no evidence or self-report of active SI/HI or A/V H.  Stpehanie reported compliance with medication and denied use of alcohol or illicit substances.  Haliegh reported scores of 2/10 for depression, 10/10 for anxiety, and 0/10 for anger/irritability.  Bexley denied any recent outbursts or panic attacks.  Lelania reported that a struggle has been dealing with a family disagreement, as her daughter's husband is trying to force them to move, and she worries about how this might impact not only her wellbeing, but the family overall. Emmamarie reported that her goal today is to change out her bedsheets and have a serious conversation with her daughter about her fears.          Second Therapeutic  Activity: Counselor introduced topic of stress management today.  Counselor provided definition of stress as feeling tense, overwhelmed, worn out, and/or exhausted, and noted that in small amounts, stress can be motivating until things become too overwhelming to manage.  Counselor also explained how stress can be acute (brief but intense) or chronic (long-lasting) and this can impact the severity of symptoms one can experience in the physical, emotional, and behavioral categories.  Counselor inquired about members' specific stressors, how long they have been prevalent, and the various symptoms that tend to manifest as a result.  Counselor also offered several stress management strategies to help improve members' coping ability, including journaling, gratitude practice, relaxation techniques, and time management tips.  Counselor also explained that research has shown a strong support network composed of trusted family, friends, or community members can increase resilience in times of stress, and inquired about who members can reach out to for help in managing stressors.  Counselor encouraged members to consider discussing stressor 'red flags' with their close supports that can be monitored and strategies for assisting them in times of crisis.  Intervention was effective, as evidenced by Dois Davenport actively participating in discussion on subject, reporting that her most significant stressors include family stress, changing jobs, money worries, and loneliness.  Deniz was able to identify several warning signs related to stress, including shaking, headaches, lack of motivation, anxiety, and worrying.  Babara expressed receptiveness to several stress management strategies practiced today in session, including deep breathing, using a stress tracker to gain insight into triggers that impact her panic attacks, and progressive muscle relaxation.  Aleila stated "I think this is gonna help me a  lot."   Assessment and  Plan: Counselor recommends that Neola remain in IOP treatment to better manage mental health symptoms, ensure stability and pursue completion of treatment plan goals. Counselor recommends adherence to crisis/safety plan, taking medications as prescribed, and following up with medical professionals if any issues arise.    Follow Up Instructions: Counselor will send Webex link for session tomorrow.  Rosselyn was advised to call back or seek an in-person evaluation if the symptoms worsen or if the condition fails to improve as anticipated.   Collaboration of Care:   Medication Management AEB Hillery Jacks, NP or Dr. Park Pope                                          Case Manager AEB Jeri Modena, CNA    Patient/Guardian was advised Release of Information must be obtained prior to any record release in order to collaborate their care with an outside provider. Patient/Guardian was advised if they have not already done so to contact the registration department to sign all necessary forms in order for [place] to release information regarding their care.    Consent: Patient/Guardian gives verbal consent for treatment and assignment of benefits for services provided during this visit. Patient/Guardian expressed understanding and agreed to proceed.   I provided 180 minutes of non-face-to-face time during this encounter.   Noralee Stain, [place], LCAS 04/16/23

## 2023-04-16 NOTE — Progress Notes (Cosign Needed Addendum)
 [facility] MD/PA/NP OP Progress Note Intensive Outpatient Program   04/16/2023 10:18 AM Lauren Chapman  MRN:  [id number]  Televisit via video: I connected with Lauren Chapman on 04/16/23 at  9:00 AM EDT by a video enabled telemedicine application and verified that I am speaking with the correct person using two identifiers.  Location: Patient: home Provider: office   I discussed the limitations of evaluation and management by telemedicine and the availability of in person appointments. The patient expressed understanding and agreed to proceed.  I discussed the assessment and treatment plan with the patient. The patient was provided an opportunity to ask questions and all were answered. The patient agreed with the plan and demonstrated an understanding of the instructions.   The patient was advised to call back or seek an in-person evaluation if the symptoms worsen or if the condition fails to improve as anticipated.  Chief Complaint: Increased depression  Subjective:  Patient presented for follow-up in regards to medication management while in intensive outpatient program.  She reports recent exacerbation of her anxiety due to discussion of daughter moving out of apartment for several months which would lead to her needing to pay all of the rent which has been a stressor for some time now. She denies present SI/HI/AVH.  She reports that her baseline anxiety still has not been completely addressed although does report partial efficacy with current medication regimen especially the increase in citalopram.  She was amenable to and continuing the increased dosage.  She feels that her anxiety definitely had worsened after she was taken off all her medications in January when she was psychiatrically hospitalized.  All questions were addressed.  She was amenable to also considering switching antipsychotics or adding additional PRN daytime dose of Navane to aid with daytime anxiety.  Risk Assessment: A  suicide and violence risk assessment was performed as part of this evaluation. There patient is deemed to be at chronic elevated risk for self-harm/suicide given the following factors: suicidal ideation or threats without a plan and feelings of hopelessness. These risk factors are mitigated by the following factors: no history of violence, motivation for treatment, utilization of positive coping skills, supportive family, sense of responsibility to family and social supports, presence of a significant relationship, presence of an available support system, expresses purpose for living, effective problem solving skills, support system in agreement with treatment recommendations, and presence of a safety plan with follow-up care. The patient is deemed to be at chronic elevated risk for violence given the following factors: N/A. These risk factors are mitigated by the following factors: no known history of violence towards others, no known history of threats of harm towards others, and no command hallucinations to harm others in the last 6 months. There is no acute risk for suicide or violence at this time. The patient was educated about relevant modifiable risk factors including following recommendations for treatment of psychiatric illness and abstaining from substance abuse.   While future psychiatric events cannot be accurately predicted, the patient does not currently require  acute inpatient psychiatric care and does not currently meet [facility] involuntary commitment criteria.     Visit Diagnosis:    ICD-10-CM   1. Severe episode of recurrent major depressive disorder, with psychotic features (HCC)  F33.3 citalopram (CELEXA) 40 MG tablet       Past Psychiatric History:   Past Medical History:  Past Medical History:  Diagnosis Date   Allergy    Anxiety    Bipolar  affective psychosis (HCC)    Complication of anesthesia    " Acting silly- waving at everyone"-Giddy   Depression     Emphysema lung (HCC)    inhaler prn,  followed by pcp and pulmonologist-- dr [name]   Family history of adverse reaction to anesthesia    Father had rheumatic fever, and died on operating table.   GERD (gastroesophageal reflux disease)    Kidney cysts 01/24/2020   Right Kidney   Migraine    Moderate persistent asthma 09/05/2021   Smokers' cough (HCC)    per pt not productive   SUI (stress urinary incontinence, female)    Tubular adenoma of colon 07/08/2020   Wears glasses     Past Surgical History:  Procedure Laterality Date   ABDOMINAL HYSTERECTOMY N/A    Phreesia 11/30/2019   CARPAL TUNNEL RELEASE Right 09-12-2007   @[facility]    and GANGLION CYST EXCISION   COLONOSCOPY     CYSTOSCOPY N/A 04/29/2019   Procedure: CYSTOSCOPY;  Surgeon: [name], MD;  Location: [facility];  Service: Gynecology;  Laterality: N/A;   DILATATION & CURETTAGE/HYSTEROSCOPY WITH MYOSURE N/A 11/13/2018   Procedure: DILATATION & CURETTAGE/HYSTEROSCOPY WITH MYOSURE;  Surgeon: [name], MD;  Location: [facility];  Service: Gynecology;  Laterality: N/A;  request 9:00am OR start time in [place] [facility] block requests one hour   DILATION AND CURETTAGE OF UTERUS  02/2019   EYE SURGERY N/A    Phreesia 11/30/2019   GANGLION CYST EXCISION     R wrist   interstem      implant for overactive bladder   KNEE ARTHROSCOPY WITH ANTERIOR CRUCIATE LIGAMENT (ACL) REPAIR Right 10/2015   LAPAROSCOPIC CHOLECYSTECTOMY  1997   LAPAROSCOPIC HYSTERECTOMY Bilateral 04/29/2019   Procedure: HYSTERECTOMY TOTAL LAPAROSCOPIC, BILATERAL SALPINO-OOPHORECTOMY;  Surgeon: [name], MD;  Location: [facility];  Service: Gynecology;  Laterality: Bilateral;   LAPAROSCOPIC TOTAL HYSTERECTOMY     TOOTH EXTRACTION  04/01/2019   tooth removal Bilateral 2022   4 teeth removed   UPPER GASTROINTESTINAL ENDOSCOPY      Family Psychiatric History:   Family History:  Family  History  Problem Relation Age of Onset   Depression Mother    Cancer Mother        Cervical   Alcohol abuse Father    Crohn's disease Sister    Stroke Brother 41   Migraines Daughter    GER disease Daughter    Depression Daughter    Bipolar disorder Daughter    Migraines Daughter    GER disease Daughter    Colon cancer Neg Hx    Rectal cancer Neg Hx    Stomach cancer Neg Hx     Social History:  Social History   Socioeconomic History   Marital status: Divorced    Spouse name: n/a   Number of children: 2   Years of education: 18   Highest education level: Master's degree (e.g., MA, MS, MEng, MEd, MSW, MBA)  Occupational History   Occupation: Disabled    Comment: Formerly a Runner, broadcasting/film/video.  Tobacco Use   Smoking status: Former    Current packs/day: 0.00    Average packs/day: 1 pack/day for 39.8 years (39.8 ttl pk-yrs)    Types: Cigarettes    Start date: 10/10/1981    Quit date: 07/18/2021    Years since quitting: 1.7    Passive exposure: Never   Smokeless tobacco: Never  Vaping Use   Vaping status: Never Used  Substance and Sexual Activity   Alcohol use: Never    Alcohol/week: 0.0 standard drinks of alcohol   Drug use: Never   Sexual activity: Not Currently    Partners: Male    Birth control/protection: Post-menopausal    Comment: 1st intercourse 53 yo-More than 5 partners  Other Topics Concern   Not on file  Social History Narrative   Lives at home with 1 of her two daughters   Right-handed.   2-4 cups caffeine daily.   Disabled    One story home   Social Drivers of Health   Financial Resource Strain: Low Risk  (01/09/2023)   Overall Financial Resource Strain (CARDIA)    Difficulty of Paying Living Expenses: Not very hard  Food Insecurity: Food Insecurity Present (03/02/2023)   Hunger Vital Sign    Worried About Running Out of Food in the Last Year: Sometimes true    Ran Out of Food in the Last Year: Sometimes true  Transportation Needs: No Transportation Needs  (03/02/2023)   PRAPARE - Administrator, Civil Service (Medical): No    Lack of Transportation (Non-Medical): No  Physical Activity: Inactive (01/09/2023)   Exercise Vital Sign    Days of Exercise per Week: 0 days    Minutes of Exercise per Session: 0 min  Stress: No Stress Concern Present (02/15/2023)   Received from [facility] of Occupational Health - Occupational Stress Questionnaire    Feeling of Stress : Only a little  Recent Concern: Stress - Stress Concern Present (02/05/2023)   Received from [facility] of Occupational Health - Occupational Stress Questionnaire    Feeling of Stress : To some extent  Social Connections: Socially Isolated (01/09/2023)   Social Connection and Isolation Panel [NHANES]    Frequency of Communication with Friends and Family: Never    Frequency of Social Gatherings with Friends and Family: Never    Attends Religious Services: Never    Database administrator or Organizations: No    Attends Banker Meetings: Never    Marital Status: Divorced    Allergies:  Allergies  Allergen Reactions   Codeine Anaphylaxis and Other (See Comments)    Cannot have ANYTHING with codeine    Metabolic Disorder Labs: Lab Results  Component Value Date   HGBA1C 5.8 05/10/2022   No results found for: "PROLACTIN" Lab Results  Component Value Date   CHOL 153 11/07/2021   TRIG 134.0 11/07/2021   HDL 52.50 11/07/2021   CHOLHDL 3 11/07/2021   VLDL 26.8 11/07/2021   LDLCALC 73 11/07/2021   LDLCALC 99 05/27/2021   Lab Results  Component Value Date   TSH 1.33 07/07/2022   TSH 0.86 09/09/2018    Therapeutic Level Labs: No results found for: "LITHIUM" No results found for: "VALPROATE" No results found for: "CBMZ"  Current Medications: Current Outpatient Medications  Medication Sig Dispense Refill   carbamazepine (TEGRETOL) 100 MG chewable tablet Chew 100 mg by mouth 2 (two) times daily.      thiothixene (NAVANE) 10 MG capsule Take 20 mg by mouth at bedtime.     albuterol (VENTOLIN HFA) 108 (90 Base) MCG/ACT inhaler Inhale 2 puffs into the lungs every 4 (four) hours as needed for wheezing or shortness of breath. 1 each 1   budesonide-formoterol (SYMBICORT) 160-4.5 MCG/ACT inhaler Inhale 2 puffs into the lungs 2 (two) times daily. 1 each 6   busPIRone (BUSPAR) 15 MG tablet Take 1  tablet (15 mg total) by mouth 3 (three) times daily. 30 tablet 0   citalopram (CELEXA) 40 MG tablet Take 1 tablet (40 mg total) by mouth daily. 30 tablet 0   EPINEPHrine 0.3 mg/0.3 mL IJ SOAJ injection Inject 0.3 mg into the muscle as needed for anaphylaxis. 1 each 0   esomeprazole (NEXIUM) 20 MG capsule Take 1 capsule (20 mg total) by mouth 2 (two) times daily before a meal. 60 capsule 1   gabapentin (NEURONTIN) 400 MG capsule Take 2 capsules (800 mg total) by mouth 3 (three) times daily.     ibuprofen (ADVIL) 800 MG tablet Take 800 mg by mouth every 8 (eight) hours as needed.     metoprolol tartrate (LOPRESSOR) 25 MG tablet Take 25 mg by mouth 2 (two) times daily.     MYRBETRIQ 50 MG TB24 tablet Take 50 mg by mouth daily.     ondansetron (ZOFRAN) 4 MG tablet Take 1 tablet (4 mg total) by mouth every 8 (eight) hours as needed for nausea or vomiting. 20 tablet 6   Rimegepant Sulfate (NURTEC) 75 MG TBDP Take 1 tablet (75 mg total) by mouth daily as needed. 8 tablet 11   SUMAtriptan (IMITREX) 6 MG/0.5ML SOLN injection Inject 0.5 mLs (6 mg total) into the skin See admin instructions. INJECT 6 MG (0.5 ml) INTO THE SKIN AS NEEDED FOR MIGRAINE OR HEADACHE. MAY REPEAT ONCE IN 2 HOURS IF HEADACHE PERSISTS OR RECURS. MAX 2 DOSES IN 24 HOURS. 3 mL 5   tiZANidine (ZANAFLEX) 4 MG tablet Take 1 tablet (4 mg total) by mouth every 6 (six) hours as needed for muscle spasms. 30 tablet 5   No current facility-administered medications for this visit.     Musculoskeletal: Virtual platform Psychiatric Specialty Exam:   Last  menstrual period 05/02/2019.There is no height or weight on file to calculate BMI.  General Appearance: Casual  Eye Contact:  Good  Speech:  Clear and Coherent  Volume:  Normal  Mood:  Depressed  Affect:  Congruent  Thought Process:  Coherent  Orientation:  Full (Time, Place, and Person)  Thought Content: Logical   Suicidal Thoughts:  No  Homicidal Thoughts:  No  Memory:  Immediate;   Good  Judgement:  Good  Insight:  Good  Psychomotor Activity:  Normal  Concentration:  Concentration: Good  Recall:  Good  Fund of Knowledge: Good  Language: Good  Akathisia:  No  Handed:  Right  AIMS (if indicated): not done  Assets:  Communication Skills Physical Health Resilience Social Support  ADL's:  Intact  Cognition: WNL  Sleep:  Good   Screenings: GAD-7    Flowsheet Row Office Visit from 03/02/2023 in [facility] Video Visit from 03/01/2023 in BEHAVIORAL HEALTH PARTIAL HOSPITALIZATION PROGRAM Office Visit from 09/11/2022 in [facility] Visit from 08/18/2022 in [facility] Visit from 07/07/2022 in [facility]  Total GAD-7 Score 15 16 14 21 20       PHQ2-9    Flowsheet Row Counselor from 04/02/2023 in BEHAVIORAL HEALTH INTENSIVE [facility] Clinical Support from 03/28/2023 in [facility] Counselor from 03/07/2023 in BEHAVIORAL HEALTH PARTIAL HOSPITALIZATION PROGRAM Office Visit from 03/02/2023 in [facility] at [facility] Video Visit from 03/01/2023 in BEHAVIORAL HEALTH PARTIAL HOSPITALIZATION PROGRAM  PHQ-2 Total Score 6 0 6 5 6   PHQ-9 Total Score  16 -- 16 13 18       Advertising copywriter from 04/02/2023 in BEHAVIORAL HEALTH INTENSIVE PSYCH Counselor from 03/07/2023 in BEHAVIORAL HEALTH PARTIAL HOSPITALIZATION PROGRAM Video Visit from 03/01/2023 in BEHAVIORAL  HEALTH PARTIAL HOSPITALIZATION PROGRAM  C-SSRS RISK CATEGORY Error: Question 6 not populated High Risk High Risk        Assessment and Plan:  Lauren Chapman is a 53 year old female with history of migraines, emphysema, OSA, GERD, bipolar disorder versus schizoaffective disorder presenting to for hospitalization program as a stepdown from a 4-day hospitalization stay due to SI with plan.  She was psychiatrically hospitalized at [facility] health from 02/15/2023 to 02/19/2023 and then again at [facility] health 02/23/2023 02/27/23.  She recently has had worsening anxiety secondary to psychosocial stressor but overall her baseline anxiety still remains elevated but partially addressed with citalopram increased dosage.  We discussed further increasing it to better address her anxiety for which she was amenable.  Plan to continue monitoring patient for need for further psychotropic medication adjustments.  Would consider switching Navane to quetiapine to use as an adjunct to aid with MDD and GAD.   Major depressive disorder-recurrent episode, severe with psychotic features R/o schizoaffective disorder GAD Continue intensive outpatient program Continue gabapentin 800 mg tid Increase Citalopram to 40 mg daily Continue Tegretol 100 mg bid Continue Buspirone 15 mg 4 times daily Continue Navane 20 mg qhs  Collaboration of Care: Collaboration of Care: Other continue medications as directed  Patient/Guardian was advised Release of Information must be obtained prior to any record release in order to collaborate their care with an outside provider. Patient/Guardian was advised if they have not already done so to contact the registration department to sign all necessary forms in order for [place] to release information regarding their care.   Consent: Patient/Guardian gives verbal consent for treatment and assignment of benefits for services provided during this visit. Patient/Guardian expressed understanding and agreed to  proceed.    Park Pope, MD 04/16/2023, 10:18 AM

## 2023-04-17 ENCOUNTER — Other Ambulatory Visit (HOSPITAL_COMMUNITY): Admitting: Licensed Clinical Social Worker

## 2023-04-17 DIAGNOSIS — F333 Major depressive disorder, recurrent, severe with psychotic symptoms: Secondary | ICD-10-CM | POA: Diagnosis not present

## 2023-04-17 DIAGNOSIS — F411 Generalized anxiety disorder: Secondary | ICD-10-CM

## 2023-04-17 NOTE — Progress Notes (Signed)
 Virtual Visit via Video Note   I connected with Lauren Chapman on 04/17/23 at  9:00 AM EDT by a video enabled telemedicine application and verified that I am speaking with the correct person using two identifiers.   At orientation to the IOP program, Case Manager discussed the limitations of evaluation and management by telemedicine and the availability of in person appointments. The patient expressed understanding and agreed to proceed with virtual visits throughout the duration of the program.   Location:  Patient: Patient Home Provider: OPT BH Office   History of Present Illness: MDD and GAD   Observations/Objective: Check In: Case Manager checked in with all participants to review discharge dates, insurance authorizations, work-related documents and needs from the treatment team regarding medications. Lauren Chapman stated needs and engaged in discussion.    Initial Therapeutic Activity: Counselor facilitated a check-in with Lauren Chapman to assess for safety, sobriety and medication compliance.  Counselor also inquired about Lauren Chapman's current emotional ratings, as well as any significant changes in thoughts, feelings or behavior since previous check in.  Lauren Chapman presented for session on time and was alert, oriented x5, with no evidence or self-report of active HI or A/V H.  Lauren Chapman reported compliance with medication and denied use of alcohol or illicit substances.  Lauren Chapman reported scores of 3/10 for depression, 10/10 for anxiety, and 0/10 for anger/irritability.  Lauren Chapman denied any recent outbursts or panic attacks.  Lauren Chapman reported that a struggle was having a difficult day after group yesterday, stating "I got into a discussion with my daughter about her possibly leaving and that still has me stressed out".  Lauren Chapman reported that she was experiencing passive SI without intent or plan this morning, stating "Sometimes I wonder if people wouldn't worry so much if I was gone".  Lauren Chapman reported that she is  practicing grounding techniques to distract herself and could contract for safety, agreeing to go to the behavioral health hospital for assessment if needed should intent or plan to harm herself develop.  Lauren Chapman reported that her goal today is to work on a cross stitch project before she goes to work this evening.         Second Therapeutic Activity: Counselor introduced [employer], [place] Chaplain to provide psychoeducation on topic of Grief and Loss with members today.  Lauren Chapman began discussion by checking in with the group about their baseline mood today, general thoughts on what grief means to them and how it has affected them personally in the past.  Lauren Chapman provided information on how the process of grief/loss can differ depending upon one's unique culture, and categories of loss one could experience (i.e. loss of a person, animal, relationship, job, identity, etc).  Lauren Chapman encouraged members to be mindful of how pervasive loss can be, and how to recognize signs which could indicate that this is having an impact on one's overall mental health and wellbeing.  Intervention was effective, as evidenced by Lauren Chapman participating in discussion with speaker on the subject, reporting that this made her reflect upon anxieties about her daughter leaving for [place].  Lauren Chapman reported that this would lead to loss of family and financial support, and the last time this situation was brought up by her daughter, she was bedridden for days with worry.  Lauren Chapman was receptive to empathetic feedback from chaplain on how to manage these feelings as she prepares for these potential losses.    Third Therapeutic Activity: Counselor introduced topic of creating mental health maintenance plan today.  Counselor provided  handout on subject to members, which stressed the importance of maintaining one's mental health in a similar way to using diet and exercise to ensure physical health.  Counselor walked members through process of  identifying triggers which could worsen symptoms, including specific people, places, and things one needs to avoid.  Members were also tasked with identifying warning signs such as thoughts, feelings, or behaviors which could indicate mental health is at increased risk.  Counselor also facilitated conversation on self-care activities and coping strategies which members have previously utilized in the past, are currently using in daily routine, or plan to use soon to assist with managing problems or symptoms when/if they appear.  Counselor encouraged members to revisit their maintenance plan often and make changes as needed to ensure day to day stability.  Intervention was effective, as evidenced by Lauren Chapman participating in activity and creating a comprehensive plan, including identification of triggers such as being around drug users or drinkers, talking to her parents, being in crowds, or dealing with negative people, and warning signs including increased depression, anxiety, and hopelessness.  Lauren Chapman also reported that she would make an effort to set aside time for self-care activities such as writing in a journal every day, taking walks more often, or doing craft projects, and use coping skills such as deep breathing, setting boundaries with 'toxic' people in her network, progressive muscle relaxation, and practicing gratitude journaling to challenge negative thinking to manage stressors.    Assessment and Plan: Counselor recommends that Lauren Chapman remain in IOP treatment to better manage mental health symptoms, ensure stability and pursue completion of treatment plan goals. Counselor recommends adherence to crisis/safety plan, taking medications as prescribed, and following up with medical professionals if any issues arise.    Follow Up Instructions: Counselor will send Webex link for session tomorrow.  Lauren Chapman was advised to call back or seek an in-person evaluation if the symptoms worsen or if the condition  fails to improve as anticipated.   Collaboration of Care:   Medication Management AEB Hillery Jacks, NP or Dr. Park Pope                                          Case Manager AEB Jeri Modena, CNA    Patient/Guardian was advised Release of Information must be obtained prior to any record release in order to collaborate their care with an outside provider. Patient/Guardian was advised if they have not already done so to contact the registration department to sign all necessary forms in order for [place] to release information regarding their care.    Consent: Patient/Guardian gives verbal consent for treatment and assignment of benefits for services provided during this visit. Patient/Guardian expressed understanding and agreed to proceed.   I provided 180 minutes of non-face-to-face time during this encounter.   Noralee Stain, LCSW, LCAS 04/17/23

## 2023-04-18 ENCOUNTER — Encounter: Payer: 59 | Admitting: Radiology

## 2023-04-18 ENCOUNTER — Other Ambulatory Visit (HOSPITAL_COMMUNITY): Admitting: Licensed Clinical Social Worker

## 2023-04-18 ENCOUNTER — Ambulatory Visit: Payer: Self-pay | Admitting: *Deleted

## 2023-04-18 DIAGNOSIS — F333 Major depressive disorder, recurrent, severe with psychotic symptoms: Secondary | ICD-10-CM

## 2023-04-18 DIAGNOSIS — F411 Generalized anxiety disorder: Secondary | ICD-10-CM
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
## 2023-04-18 NOTE — Telephone Encounter (Signed)
 Pt triage notes has appt tomorrow with you Dr Oswaldo Done

## 2023-04-18 NOTE — Telephone Encounter (Signed)
  Chief Complaint: back pain worsening  Symptoms: left mid upper back pain 7/10 . Worse with movement. Affecting normal activities / work. Excedrin helps "some". Frequency: couple of weeks  Pertinent Negatives: Patient denies chest pain no difficulty breathing no fever no N/T no urinary/ bowel issues Disposition: [] ED /[] Urgent Care (no appt availability in office) / [x] Appointment(In office/virtual)/ []  [facility]/ [] Home Care/ [] Refused Recommended Disposition /[] [facility]/ []  Follow-up with PCP Additional Notes:   No available appt with PCP within 3 days. Scheduled appt tomorrow with provider in office. Please advise if can be seen today recommended if pain worsens go to UC/ED>       Copied from CRM [id number]. Topic: Clinical - Red Word Triage >> Apr 18, 2023  8:45 AM Drema Balzarine wrote: Red Word that prompted transfer to Nurse Triage: Patient scheduled appointment 4/10 via MyChart but wants to be seen sooner, says she's been having bad pack pain 7/10 and can't move without it hurting Reason for Disposition  [1] MODERATE back pain (e.g., interferes with normal activities) AND [2] present > 3 days  Answer Assessment - Initial Assessment Questions 1. ONSET: "When did the pain begin?"      Couple of weeks ago  2. LOCATION: "Where does it hurt?" (upper, mid or lower back)     Left side mid upper back  3. SEVERITY: "How bad is the pain?"  (e.g., Scale 1-10; mild, moderate, or severe)   - MILD (1-3): Doesn't interfere with normal activities.    - MODERATE (4-7): Interferes with normal activities or awakens from sleep.    - SEVERE (8-10): Excruciating pain, unable to do any normal activities.      Moderate 7/10 4. PATTERN: "Is the pain constant?" (e.g., yes, no; constant, intermittent)      Comes and goes with excederin but with movement constant  5. RADIATION: "Does the pain shoot into your legs or somewhere else?"     No  6. CAUSE:  "What do you think is causing  the back pain?"      Not sure  7. BACK OVERUSE:  "Any recent lifting of heavy objects, strenuous work or exercise?"     Possible lifting something at work but does not remember any specific event  8. MEDICINES: "What have you taken so far for the pain?" (e.g., nothing, acetaminophen, NSAIDS)     Excederin  9. NEUROLOGIC SYMPTOMS: "Do you have any weakness, numbness, or problems with bowel/bladder control?"     na 10. OTHER SYMPTOMS: "Do you have any other symptoms?" (e.g., fever, abdomen pain, burning with urination, blood in urine)       N a 11. PREGNANCY: "Is there any chance you are pregnant?" "When was your last menstrual period?"       na  Protocols used: Back Pain-A-AH

## 2023-04-19 ENCOUNTER — Ambulatory Visit (INDEPENDENT_AMBULATORY_CARE_PROVIDER_SITE_OTHER): Admitting: Student in an Organized Health Care Education/Training Program

## 2023-04-19 ENCOUNTER — Encounter: Payer: Self-pay | Admitting: Student in an Organized Health Care Education/Training Program

## 2023-04-19 ENCOUNTER — Other Ambulatory Visit (HOSPITAL_COMMUNITY): Admitting: Licensed Clinical Social Worker

## 2023-04-19 VITALS — BP 112/62 | HR 46 | Temp 97.6°F | Wt 183.0 lb

## 2023-04-19 DIAGNOSIS — M545 Low back pain, unspecified: Secondary | ICD-10-CM | POA: Diagnosis not present

## 2023-04-19 DIAGNOSIS — F411 Generalized anxiety disorder: Secondary | ICD-10-CM

## 2023-04-19 DIAGNOSIS — G43109 Migraine with aura, not intractable, without status migrainosus: Secondary | ICD-10-CM | POA: Diagnosis not present

## 2023-04-19 DIAGNOSIS — F333 Major depressive disorder, recurrent, severe with psychotic symptoms: Secondary | ICD-10-CM | POA: Diagnosis not present

## 2023-04-19 MED ORDER — TIZANIDINE HCL 4 MG PO TABS: 4.0000 mg | ORAL_TABLET | Freq: Four times a day (QID) | ORAL | 5 refills | Status: AC | PRN

## 2023-04-19 MED ORDER — MELOXICAM 15 MG PO TABS
15.0000 mg | ORAL_TABLET | Freq: Every day | ORAL | 0 refills | Status: DC | PRN
Start: 2023-04-19 — End: 2023-06-20

## 2023-04-19 NOTE — Progress Notes (Signed)
 Acute Office Visit  Subjective:     Patient ID: Lauren Chapman, female    DOB: Aug 22, 1970, 53 y.o.   MRN: [id number]  Chief Complaint  Patient presents with   Back Pain    Patient is not sure how she hurt her back but left middle of back pain that started 1 week ago. Excedrin is the only medication she has been taking to help with the pain.     HPI  Patient is in today for acute low back pain starting about 10 days ago.  Patient works as a Conservation officer, nature for [employer].  She has had no recent trauma or falls.  She woke up with a soreness in the left side of her low back about 10 days ago.  No recent changes.  Does not usually lift heavy objects in her work.  That discomfort and stiffness in her left back has been stable to progressive over the last few days.  She is still able to go to work, still able to function.  She just feels very stiff.  Having some difficulty sleeping because of tightness in the low back.  Denies fevers or chills.  No systemic symptoms.  Eating and drinking well.  Denies any personal history of cancer.  No trauma.  Denies any weakness in her legs.  Denies any bowel or bladder changes.       Objective:    BP 112/62   Pulse (!) 46   Temp 97.6 F (36.4 C) (Temporal)   Wt 183 lb (83 kg)   LMP 05/02/2019 Comment: irregular cycles  SpO2 100%   BMI 32.42 kg/m    Physical Exam  General: Well-appearing woman, no distress Skin: Normal skin, no rash, no vesicles or erythema, no bruising over the area that is painful Back: Normal appearing back, there is tenderness to palpation over the left paraspinal muscles, no tenderness over the lumbar spinal processes. Neuro: Alert, conversational, mildly slow get up and go, going to table okay, full strength in the upper and lower extremities, no sensory deficits.      Assessment & Plan:   Problem List Items Addressed This Visit       Unprioritized   Acute low back pain - Primary   Acute problem over the last 10  days.  Exam is consistent with a likely muscle strain of the left paraspinal muscles.  No red flag symptoms.  Low value to the imaging at this time.  Low value to physical therapy right now as well.  We talked about supportive care.  I recommended meloxicam 15 mg daily as needed for anti-inflammatory.  For back spasms she is experiencing at night, I recommended being.  She has used tizanidine 4 mg as needed for migraines in the past and tolerated dose well.  Will refill that.  We talked about the natural course of acute low back pain, it is favorable prognosis, and a reasonable timeline to see improvement.      Relevant Medications   tiZANidine (ZANAFLEX) 4 MG tablet   meloxicam (MOBIC) 15 MG tablet   Other Visit Diagnoses       Migraine with aura and without status migrainosus, not intractable       Relevant Medications   tiZANidine (ZANAFLEX) 4 MG tablet   meloxicam (MOBIC) 15 MG tablet       Meds ordered this encounter  Medications   tiZANidine (ZANAFLEX) 4 MG tablet    Sig: Take 1 tablet (4 mg  total) by mouth every 6 (six) hours as needed for muscle spasms.    Dispense:  30 tablet    Refill:  5   meloxicam (MOBIC) 15 MG tablet    Sig: Take 1 tablet (15 mg total) by mouth daily as needed for pain.    Dispense:  30 tablet    Refill:  0    No follow-ups on file.  Tyson Alias, MD

## 2023-04-19 NOTE — Patient Instructions (Signed)
 Patient Instructions for Supportive Care of Acute Low Back Pain  Acute low back pain is common and usually improves over time. Here are some steps to help manage your pain and support your recovery:  1. Stay Active: Continue your regular activities as much as possible. Avoid bed rest, as staying active can help you recover faster.  2. Use Heat Therapy: Applying a heating pad to the affected area can provide relief. Use it for 15-20 minutes at a time, several times a day.  3. Pain Relief Medications: Over-the-counter medications like nonsteroidal anti-inflammatory drugs (NSAIDs) (e.g., ibuprofen) can help reduce pain and inflammation. Follow the dosage instructions on the package and consult your doctor if you have any concerns.  4. Avoid Prolonged Sitting: Try not to sit for long periods. If you need to sit, use a chair with good lumbar support and take breaks to stand and stretch.  5. Gentle Exercises: Engage in gentle exercises such as walking or stretching. Avoid strenuous activities that may worsen your pain.  6. Reassurance: Understand that acute low back pain usually has a favorable prognosis and is not typically caused by a serious condition. Most people experience significant improvement within a few weeks.  7. Education: Learn about the anatomy and strength of your spine, and understand that pain does not always mean harm. Use active pain coping strategies to reduce fear and anxiety.  8. Consult Your Doctor: If your pain does not improve within a few weeks, or if you experience severe symptoms such as numbness, weakness, or loss of bladder/bowel control, contact your doctor immediately.  Following these steps can help you manage your acute low back pain effectively and support your recovery.

## 2023-04-19 NOTE — Assessment & Plan Note (Addendum)
 Acute problem over the last 10 days.  Exam is consistent with a likely muscle strain of the left paraspinal muscles.  No red flag symptoms.  Low value to the imaging at this time.  Low value to physical therapy right now as well.  We talked about supportive care.  I recommended meloxicam 15 mg daily as needed for anti-inflammatory.  For back spasms she is experiencing at night, I recommended being.  She has used tizanidine 4 mg as needed for migraines in the past and tolerated dose well.  Will refill that.  We talked about the natural course of acute low back pain, it is favorable prognosis, and a reasonable timeline to see improvement.
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
## 2023-04-20 ENCOUNTER — Other Ambulatory Visit (HOSPITAL_COMMUNITY): Admitting: Psychiatry

## 2023-04-20 DIAGNOSIS — F333 Major depressive disorder, recurrent, severe with psychotic symptoms: Secondary | ICD-10-CM | POA: Diagnosis not present

## 2023-04-20 DIAGNOSIS — F411 Generalized anxiety disorder: Secondary | ICD-10-CM

## 2023-04-20 NOTE — Progress Notes (Signed)
 Virtual Visit via Video Note   I connected with Lauren Chapman on 04/20/23 at  9:00 AM EDT by a video enabled telemedicine application and verified that I am speaking with the correct person using two identifiers.   At orientation to the IOP program, Case Manager discussed the limitations of evaluation and management by telemedicine and the availability of in person appointments. The patient expressed understanding and agreed to proceed with virtual visits throughout the duration of the program.   Location:  Patient: Patient Home Provider: Home Office   History of Present Illness: MDD and PTSD   Observations/Objective: Check In: Case Manager checked in with all participants to review discharge dates, insurance authorizations, work-related documents and needs from the treatment team regarding medications. Lauren Chapman stated needs and engaged in discussion.    Initial Therapeutic Activity: Counselor facilitated a check-in with Lauren Chapman to assess for safety, sobriety and medication compliance.  Counselor also inquired about Lauren Chapman's current emotional ratings, as well as any significant changes in thoughts, feelings or behavior since previous check in.  Lauren Chapman presented for session on time and was alert, oriented x5, with no evidence or self-report of active SI/HI or A/V H.  Lauren Chapman reported compliance with medication and denied use of alcohol or illicit substances.  Lauren Chapman reported scores of 2/10 for depression, 6/10 for anxiety, and 0/10 for anger/irritability.  Lauren Chapman denied any recent outbursts or panic attacks.  Lauren Chapman reported that a struggle was going to work yesterday, which was more difficult due to back pain.  Lauren Chapman reported that a success was attending a doctor's appointment and receiving medication to manage this pain.  Lauren Chapman reported that her goal this weekend is to work at her job each day, but try to set aside some time for self-care activities like cross stich in between.            Second  Therapeutic Activity: Counselor covered topic of distress tolerance skills today.  Counselor utilized a DBT handout which explained how distressing situations don't always have quick solutions, so the only choice is to sit with uncomfortable emotions until they pass.  Counselor offered the IMPROVE acronym as a solution to this problem, which outlined various skills (i.e. Imagery, Meaning, Prayer, Relaxation, 'One thing in the moment', Vacation, and Encouragement) that could be explored in order to improve ability to tolerate discomfort.  Counselor tasked members with identifying personalized strategies for each category which could have been implemented to handle a recent challenge more effectively.  Intervention was effective, as evidenced by Lauren Davenport actively engaging in discussion on subject, reporting that she finds job hunting to be a stressful process.  Lauren Chapman was able to identify several strategies for handling a similar struggle in the future, including visualizing herself relaxing on the beach as an escape, seeing her transition to a new job as a way to build experience and skills, documenting gratitude in her journal, hugging her cat, or taking a walk outside.  Lauren Chapman stated "I have a tendency to focus on the negative so this will definitely help".  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  Lauren Chapman provided information on history of her local agency, mission statement, and the variety of unique services offered which group members might find beneficial to engage in, including both virtual and in-person support groups, as well as peer support program for mentoring.  Lauren Chapman offered time to answer member's questions regarding services and encouraged them to consider utilizing  these services to assist in working towards their individual wellness goals.  Intervention was effective, as evidenced  by Lauren Davenport participating in discussion with speaker on the subject, reporting that she is very interested in the groups and other services, so she will print off the referral form today for completion, and turn it in before she completes [facility].      Assessment and Plan: Counselor recommends that Lauren Chapman remain in IOP treatment to better manage mental health symptoms, ensure stability and pursue completion of treatment plan goals. Counselor recommends adherence to crisis/safety plan, taking medications as prescribed, and following up with medical professionals if any issues arise.    Follow Up Instructions: Counselor will send Webex link for session tomorrow.  Lauren Chapman was advised to call back or seek an in-person evaluation if the symptoms worsen or if the condition fails to improve as anticipated.   Collaboration of Care:   Medication Management AEB Hillery Jacks, NP or Dr. Park Pope                                          Case Manager AEB Jeri Modena, CNA    Patient/Guardian was advised Release of Information must be obtained prior to any record release in order to collaborate their care with an outside provider. Patient/Guardian was advised if they have not already done so to contact the registration department to sign all necessary forms in order for [place] to release information regarding their care.    Consent: Patient/Guardian gives verbal consent for treatment and assignment of benefits for services provided during this visit. Patient/Guardian expressed understanding and agreed to proceed.   I provided 180 minutes of non-face-to-face time during this encounter.   Noralee Stain, LCSW, LCAS 04/20/23

## 2023-04-23 ENCOUNTER — Other Ambulatory Visit (HOSPITAL_COMMUNITY): Admitting: Licensed Clinical Social Worker

## 2023-04-23 DIAGNOSIS — F333 Major depressive disorder, recurrent, severe with psychotic symptoms: Secondary | ICD-10-CM | POA: Diagnosis not present

## 2023-04-23 DIAGNOSIS — F411 Generalized anxiety disorder: Secondary | ICD-10-CM

## 2023-04-23 NOTE — Progress Notes (Signed)
 Virtual Visit via Video Note   I connected with Dois Davenport L. Cavanagh on 04/23/23 at  9:00 AM EDT by a video enabled telemedicine application and verified that I am speaking with the correct person using two identifiers.   At orientation to the IOP program, Case Manager discussed the limitations of evaluation and management by telemedicine and the availability of in person appointments. The patient expressed understanding and agreed to proceed with virtual visits throughout the duration of the program.   Location:  Patient: Patient Home Provider: Home Office   History of Present Illness: MDD and GAD   Observations/Objective: Check In: Case Manager checked in with all participants to review discharge dates, insurance authorizations, work-related documents and needs from the treatment team regarding medications. Karem stated needs and engaged in discussion.    Initial Therapeutic Activity: Counselor facilitated a check-in with Elayna to assess for safety, sobriety and medication compliance.  Counselor also inquired about Zully's current emotional ratings, as well as any significant changes in thoughts, feelings or behavior since previous check in.  Caydee presented for session on time and was alert, oriented x5, with no evidence or self-report of active HI or A/V H.  Radley reported that she was experiencing passive SI without intent or plan today, but could contract for safety, agreeing to go to the hospital or call 911 if she develops plan or intent or hurt herself.  Kinley reported compliance with medication and denied use of alcohol or illicit substances.  [place] reported scores of 2/10 for depression, 4/10 for anxiety, and 0/10 for anger/irritability.  Kailena denied any recent outbursts or panic attacks.  Koree reported that a struggle was working all weekend at her job, which was stressful due to customers.  Susana reported that a success was spending time with her mother, who is recovering from  illness.  She reported that her resume is also updated now, so she will plan to look for other jobs.  Laronda reported that her goal today is to turn in a form for [employer] and then pay her insurance off.       Second Therapeutic Activity: Counselor introduced topic of anger management today.  Counselor virtually shared a handout with members on this subject featuring a variety of coping skills, and facilitated discussion on these approaches.  Examples included raising awareness of anger triggers, practicing deep breathing, keeping an anger log to better understand episodes, using diversion activities to distract oneself for 30 minutes, taking a time out when necessary, and being mindful of warning signs tied to thoughts or behavior.  Counselor inquired about which techniques group members have used before, what has proved to be helpful, what their unique warning signs might be, as well as what they will try out in the future to assist with de-escalation.  Intervention was effective, as evidenced by Dois Davenport participating in discussion on activity, and reporting that she has been having increasingly more outbursts lately, especially at work.  Joie stated "I'm pretty patient to a point. Some things I push down and then it just comes spewing out".  She reported that she has also used substances in the past to try to mask these feelings, but recognizes that this only makes things worse.  Stepahnie reported that her triggers include embarrassment, feeling helpless or out of control, feeling ignored, issues with traffic or other drivers, and experiencing prejudice.  Tamzin reported that warning signs include increased blood pressure, crying spells, headaches, and yelling.  Maritza reported that she will work  to manage anger more effectively by setting healthier boundaries with people and settings that could be regular triggers, and using coping skills such as deep breathing when triggered.  She reported that she  would also try to channel her anger into motivation to make positive changes in her life, such as going to school.    Assessment and Plan: Counselor recommends that Kennedee remain in IOP treatment to better manage mental health symptoms, ensure stability and pursue completion of treatment plan goals. Counselor recommends adherence to crisis/safety plan, taking medications as prescribed, and following up with medical professionals if any issues arise.    Follow Up Instructions: Counselor will send Webex link for session tomorrow.  Camela was advised to call back or seek an in-person evaluation if the symptoms worsen or if the condition fails to improve as anticipated.   Collaboration of Care:   Medication Management AEB Hillery Jacks, NP or Dr. Park Pope                                          Case Manager AEB Jeri Modena, CNA    Patient/Guardian was advised Release of Information must be obtained prior to any record release in order to collaborate their care with an outside provider. Patient/Guardian was advised if they have not already done so to contact the registration department to sign all necessary forms in order for [place] to release information regarding their care.    Consent: Patient/Guardian gives verbal consent for treatment and assignment of benefits for services provided during this visit. Patient/Guardian expressed understanding and agreed to proceed.   I provided 180 minutes of non-face-to-face time during this encounter.   Noralee Stain, [place], LCAS 04/23/23

## 2023-04-24 ENCOUNTER — Other Ambulatory Visit (HOSPITAL_COMMUNITY): Attending: Psychiatry | Admitting: Licensed Clinical Social Worker

## 2023-04-24 DIAGNOSIS — F333 Major depressive disorder, recurrent, severe with psychotic symptoms: Secondary | ICD-10-CM | POA: Insufficient documentation

## 2023-04-24 DIAGNOSIS — F411 Generalized anxiety disorder: Secondary | ICD-10-CM | POA: Diagnosis present

## 2023-04-24 NOTE — Progress Notes (Signed)
 Virtual Visit via Video Note   I connected with Lauren Chapman on 04/24/23 at  9:00 AM EDT by a video enabled telemedicine application and verified that I am speaking with the correct person using two identifiers.   At orientation to the IOP program, Case Manager discussed the limitations of evaluation and management by telemedicine and the availability of in person appointments. The patient expressed understanding and agreed to proceed with virtual visits throughout the duration of the program.   Location:  Patient: Patient Home Provider: OPT BH Office   History of Present Illness: MDD and GAD   Observations/Objective: Check In: Case Manager checked in with all participants to review discharge dates, insurance authorizations, work-related documents and needs from the treatment team regarding medications. Lauren Chapman stated needs and engaged in discussion.    Initial Therapeutic Activity: Counselor facilitated a check-in with Lauren Chapman to assess for safety, sobriety and medication compliance.  Counselor also inquired about Lauren Chapman's current emotional ratings, as well as any significant changes in thoughts, feelings or behavior since previous check in.  Lauren Chapman presented for session on time and was alert, oriented x5, with no evidence or self-report of active SI/HI or A/V H.  Lauren Chapman reported compliance with medication and denied use of alcohol or illicit substances.  Lauren Chapman reported scores of 1/10 for depression, 4/10 for anxiety, and 0/10 for anger/irritability.  Lauren Chapman denied any recent outbursts or panic attacks.  Lauren Chapman reported that a struggle was feeling frustrated at her son in law yesterday because he hasn't been responsible around the house despite being unemployed.  Lauren Chapman reported that a success was going to [employer] yesterday to pick up more material from self-care projects.  Lauren Chapman reported that her goal today is to go to work after group.          Second Therapeutic Activity: Counselor  introduced [employer], [place] Chaplain to provide psychoeducation on topic of Grief and Loss with members today.  Lauren Chapman began discussion by checking in with the group about their baseline mood today, general thoughts on what grief means to them and how it has affected them personally in the past.  Lauren Chapman provided information on how the process of grief/loss can differ depending upon one's unique culture, and categories of loss one could experience (i.e. loss of a person, animal, relationship, job, identity, etc).  Lauren Chapman encouraged members to be mindful of how pervasive loss can be, and how to recognize signs which could indicate that this is having an impact on one's overall mental health and wellbeing.  Intervention was effective, as evidenced by Lauren Davenport participating in discussion with speaker on the subject, reporting that her mother has been sick lately, and this has her pre-grieving that eventual loss.  Lauren Chapman reported that they have had a complicated relationship, and she is sad that they haven't been closer, and that her mother hasn't been more loving and supportive.  Lauren Chapman was receptive to empathetic feedback from chaplain.     Third Therapeutic Activity: Counselor continued discussion upon topic of self-care today with group members.  Counselor virtually shared self-care assessment form with members via Webex to complete final sub-categories (i.e. social, spiritual, and professional).  Members were asked to rank their engagement in the activities listed for each dimension on a scale of 1-3, with 1 indicating 'Poor', 2 indicating 'Ok', and 3 indicating 'Well'.  Counselor invited members to share results of this assessment, and inquired about which areas of self-care they are doing well in, as well as areas  that require attention, and how they plan to begin addressing this during treatment.  Intervention was effective, as evidenced by Lauren Davenport successfully completing final sections of assessment and  actively engaging in discussion on subject, reporting that she is excelling in areas such as keeping in touch with old friends, asking for help, recognizing things that give meaning to life, and appreciating art that is impactful, but would benefit from focusing more on areas such as reaching out to people that live far away, having stimulating conversations, meeting new people, meditating, and participating in a cause that is important to her.  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    Assessment and Plan: Counselor recommends that Lauren Chapman remain in IOP treatment to better manage mental health symptoms, ensure stability and pursue completion of treatment plan goals. Counselor recommends adherence to crisis/safety plan, taking medications as prescribed, and following up with medical professionals if any issues arise.    Follow Up Instructions: Counselor will send Webex link for session tomorrow.  Lauren Chapman was advised to call back or seek an in-person evaluation if the symptoms worsen or if the condition fails to improve as anticipated.   Collaboration of Care:   Medication Management AEB Hillery Jacks, NP or Dr. Park Pope                                          Case Manager AEB Jeri Modena, CNA    Patient/Guardian was advised Release of Information must be obtained prior to any record release in order to collaborate their care with an outside provider. Patient/Guardian was advised if they have not already done so to contact the registration department to sign all necessary forms in order for [place] to release information regarding their care.    Consent: Patient/Guardian gives verbal  consent for treatment and assignment of benefits for services provided during this visit. Patient/Guardian expressed understanding and agreed to proceed.   I provided 180 minutes of non-face-to-face time during this encounter.   Noralee Stain, LCSW, LCAS 04/24/23

## 2023-04-25 ENCOUNTER — Other Ambulatory Visit (HOSPITAL_COMMUNITY): Admitting: Licensed Clinical Social Worker

## 2023-04-25 ENCOUNTER — Encounter: Payer: Self-pay | Admitting: Gastroenterology

## 2023-04-25 DIAGNOSIS — F411 Generalized anxiety disorder: Secondary | ICD-10-CM

## 2023-04-25 DIAGNOSIS — F333 Major depressive disorder, recurrent, severe with psychotic symptoms: Secondary | ICD-10-CM

## 2023-04-25 NOTE — Progress Notes (Signed)
 Virtual Visit via Video Note  I connected with Lauren Chapman on 04/25/23 at  9:00 AM EDT by a video enabled telemedicine application and verified that I am speaking with the correct person using two identifiers.  Location: Patient: Home Provider: Office   I discussed the limitations of evaluation and management by telemedicine and the availability of in person appointments. The patient expressed understanding and agreed to proceed.   I discussed the assessment and treatment plan with the patient. The patient was provided an opportunity to ask questions and all were answered. The patient agreed with the plan and demonstrated an understanding of the instructions.   The patient was advised to call back or seek an in-person evaluation if the symptoms worsen or if the condition fails to improve as anticipated.  Carlyn Reichert, MD PGY-3  [facility] Partial Hospitalization Program Psych Discharge Summary  Lauren Chapman [id number]  Admission date: 04/02/2023 Discharge date: 04/25/2023  Reason for admission:  The patient is a 53 year old female with a past history of bipolar disorder vs schizoaffective disorder.  She has had several recent psychiatric hospitalizations for suicidal thoughts.  Diagnoses given in her time at the IOP program is major depressive disorder.  Progress in Program Toward Treatment Goals: met  Progress (rationale): patient demonstrated understanding and application of coping skills as well as improvement in mental health symptom burden.  On interview today, the patient denies auditory/visual hallucinations.  The patient reports good mood, appetite, and sleep. They deny suicidal and homicidal thoughts. The patient denies side effects from their medications.  Review of systems as below.    Psychiatric Specialty Exam: Physical Exam Constitutional:      Appearance: the patient is not toxic-appearing.  Pulmonary:     Effort: Pulmonary effort is normal.  Neurological:      General: No focal deficit present.     Mental Status: the patient is alert and oriented to person, place, and time.   Review of Systems  Respiratory:  Negative for shortness of breath.   Cardiovascular:  Negative for chest pain.  Gastrointestinal:  Negative for abdominal pain, constipation, diarrhea, nausea and vomiting.  Neurological:  Negative for headaches.      No Vitals taken  General Appearance: Fairly Groomed  Eye Contact:  Good  Speech:  Clear and Coherent  Volume:  Normal  Mood:  Euthymic  Affect:  Congruent  Thought Process:  Coherent  Orientation:  Full (Time, Place, and Person)  Thought Content: Logical   Suicidal Thoughts:  No  Homicidal Thoughts:  No  Memory:  Immediate;   Good  Judgement:  fair  Insight:  fair  Psychomotor Activity:  Normal  Concentration:  Concentration: Good  Recall:  Good  Fund of Knowledge: Good  Language: Good  Akathisia:  No  Handed:  not assessed  AIMS (if indicated): not done  Assets:  Communication Skills Desire for Improvement Financial Resources/Insurance Housing Leisure Time Physical Health  ADL's:  Intact  Cognition: WNL          Discharge Plan: Referral to Psychiatrist and Referral to Counselor/Psychotherapist Patient is scheduled to follow-up with her psychiatric provider at [facility], as well as her mental health counselor, who works at the same Financial risk analyst.  Continue medications as prescribed: - Celexa 40 mg daily - Tegretol 100 mg twice daily - Navane 20 mg nightly - BuSpar 15 mg 4 times daily - Gabapentin 800 mg 3 times daily  Patient/Guardian was advised Release of Information must be obtained prior to  any record release in order to collaborate their care with an outside provider. Patient/Guardian was advised if they have not already done so to contact the registration department to sign all necessary forms in order for [place] to release information regarding their care.   Consent: Patient/Guardian gives  verbal consent for treatment and assignment of benefits for services provided during this visit. Patient/Guardian expressed understanding and agreed to proceed.   Carlyn Reichert, MD PGY-3

## 2023-04-25 NOTE — Progress Notes (Signed)
 Virtual Visit via Video Note   I connected with Lauren Chapman on 04/25/23 at  9:00 AM EDT by a video enabled telemedicine application and verified that I am speaking with the correct person using two identifiers.   At orientation to the IOP program, Case Manager discussed the limitations of evaluation and management by telemedicine and the availability of in person appointments. The patient expressed understanding and agreed to proceed with virtual visits throughout the duration of the program.   Location:  Patient: Patient Home Provider: Home Office   History of Present Illness: MDD and GAD   Observations/Objective: Check In: Case Manager checked in with all participants to review discharge dates, insurance authorizations, work-related documents and needs from the treatment team regarding medications. Lauren Chapman stated needs and engaged in discussion.    Initial Therapeutic Activity: Counselor facilitated a check-in with Lauren Chapman to assess for safety, sobriety and medication compliance.  Counselor also inquired about Lauren Chapman's current emotional ratings, as well as any significant changes in thoughts, feelings or behavior since previous check in.  Lauren Chapman presented for session on time and was alert, oriented x5, with no evidence or self-report of active SI/HI or A/V H.  Lauren Chapman reported compliance with medication and denied use of alcohol or illicit substances.  Lauren Chapman reported scores of 1/10 for depression, 5/10 for anxiety, and 0/10 for anger/irritability.  Lauren Chapman denied any recent outbursts or panic attacks.  Lauren Chapman reported that a struggle has been worrying about her daughter, who has decided to divorce her husband after ongoing issues that have continued.  Lauren Chapman stating "Something is going on with her.  Its nothing I can do anything about though".  Lauren Chapman reported that a success was having a serious conversation with her daughter to provide reinforcement for this decision and ensure that she doesn't  change her mind, as she did in the past. Lauren Chapman reported that her goal today is to contact the [employer] to assist she and her daughter with mutual stressors.            Second Therapeutic Activity: Counselor introduced Lauren Chapman, [employer] Pharmacist, to provide psychoeducation on topic of medication compliance with members today.  Lauren Chapman provided psychoeducation on classes of medications such as antidepressants, antipsychotics, what symptoms they are intended to treat, and any side effects one might encounter while on a particular prescription.  Time was allowed for clients to ask any questions they might have of [facility] regarding this specialty.  Intervention was effective, as evidenced by Lauren Davenport participating in discussion with speaker on the subject, reporting that she has been experiencing restlessness at night, and didn't know if one of her medications could be causing this, and disrupting her rest.  She was receptive to feedback from pharmacist on different medication side effects that could explain this, and alternatives to improve nighttime rest.    Third Therapeutic Activity: Counselor covered topic of attachment styles today.  Counselor virtually shared a handout with the group on this topic which defined attachment styles as how people think about and behave in relationships.  Styles were broken down by category, including secure attachment where one believes close relationships are trustworthy, compared to insecure attachment (i.e. anxious, avoidant, or anxious-avoidant) where one is distrusting or worries about their bond with others.  Counselor inquired about which attachment style members most related to, how this has influenced their mental health/well-being, and whether they intend to begin making any changes.  Intervention was effective, as evidenced by Lauren Davenport participating in discussion, and reporting  that she most identified with the anxious attachment style due to traits such as  being distrustful of partners and relationships, afraid of abandonment, rejection, and conflict, and sensitive to criticism.  Lauren Chapman reported that she believes this has much to do with her childhood, as she developed an attachment disorder and stated "It doesn't feel like I have healthy attachment with anyone except my daughters".  Lauren Chapman reported that she plans to continue with therapy to build up greater sense of self-worth and educate herself on red flags so that she can find a healthy romantic partner when the time is right.     Assessment and Plan: Counselor recommends that Lauren Chapman remain in IOP treatment to better manage mental health symptoms, ensure stability and pursue completion of treatment plan goals. Counselor recommends adherence to crisis/safety plan, taking medications as prescribed, and following up with medical professionals if any issues arise.    Follow Up Instructions: Counselor will send Webex link for session tomorrow.  Lauren Chapman was advised to call back or seek an in-person evaluation if the symptoms worsen or if the condition fails to improve as anticipated.   Collaboration of Care:   Medication Management AEB Hillery Jacks, NP or Dr. Carlyn Reichert                                          Case Manager AEB Jeri Modena, CNA    Patient/Guardian was advised Release of Information must be obtained prior to any record release in order to collaborate their care with an outside provider. Patient/Guardian was advised if they have not already done so to contact the registration department to sign all necessary forms in order for [place] to release information regarding their care.    Consent: Patient/Guardian gives verbal consent for treatment and assignment of benefits for services provided during this visit. Patient/Guardian expressed understanding and agreed to proceed.   I provided 180 minutes of non-face-to-face time during this encounter.   Noralee Stain, LCSW, LCAS 04/25/23

## 2023-04-26 ENCOUNTER — Encounter (HOSPITAL_COMMUNITY): Payer: Self-pay | Admitting: Psychiatry

## 2023-04-26 ENCOUNTER — Other Ambulatory Visit (HOSPITAL_COMMUNITY): Admitting: Psychiatry

## 2023-04-26 DIAGNOSIS — F411 Generalized anxiety disorder: Secondary | ICD-10-CM | POA: Diagnosis not present

## 2023-04-26 DIAGNOSIS — F333 Major depressive disorder, recurrent, severe with psychotic symptoms: Secondary | ICD-10-CM

## 2023-04-26 NOTE — Progress Notes (Signed)
 Virtual Visit via Video Note  I connected with Lauren Chapman on @TODAY @ at  9:00 AM EDT by a video enabled telemedicine application and verified that I am speaking with the correct person using two identifiers.  Location: Patient: at home Provider: at office   I discussed the limitations of evaluation and management by telemedicine and the availability of in person appointments. The patient expressed understanding and agreed to proceed.  I discussed the assessment and treatment plan with the patient. The patient was provided an opportunity to ask questions and all were answered. The patient agreed with the plan and demonstrated an understanding of the instructions.   The patient was advised to call back or seek an in-person evaluation if the symptoms worsen or if the condition fails to improve as anticipated.  I provided 20 minutes of non-face-to-face time during this encounter.   Lauren Chapman, M.Ed,CNA Virtual Visit via Video Note  I connected with Lauren Chapman on @TODAY @ at  9:00 AM EDT by a video enabled telemedicine application and verified that I am speaking with the correct person using two identifiers.  Location: Patient: at home Provider: at office   I discussed the limitations of evaluation and management by telemedicine and the availability of in person appointments. The patient expressed understanding and agreed to proceed.  I discussed the assessment and treatment plan with the patient. The patient was provided an opportunity to ask questions and all were answered. The patient agreed with the plan and demonstrated an understanding of the instructions.   The patient was advised to call back or seek an in-person evaluation if the symptoms worsen or if the condition fails to improve as anticipated.  I provided 20 minutes of non-face-to-face time during this encounter.   Lauren Chapman, M.Ed,CNA    Patient ID: Lauren Chapman, female   DOB: [birth year], 53 y.o.   MRN:  [id number] D:  As per previous CCA states:  "Lauren Chapman is a 53yo female referred to [facility] following a 4 day inpatient stay for SI with a plan. She cites her stressors as anxiety and her relationship with her mother. She reports decreased ADLs, stating she will sometimes go a week without showering. She currently takes Gabapentin, Buspar, Celexa, Navene, and Tegretol. She reports current therapy and outpatient medication management and hx of same. She endorses multiple hospitalizations, three of which were recent and back-to-back. She states the first was for Xanax detox, as she had begun overusing her prescription to cope with anxiety. The second was to finish detoxing and to make medication adjustments, as she had been taking 12 psych meds. She states, "it seems like every five years I go back to the hospital." She endorses five suicide attempts, the last one being 8-10 years ago. She has been diagnosed with schizoaffective disorder and reports she first started experiencing symptoms at age 27. She reports recent passive SI and completes safety planning with cln. She denies HI, current and recent substance use, and states there are no firearms in her home. She endorses AVH, which is her baseline. She endorses previous NSSI by cutting and burning herself with cigarettes several years ago. She reports no family hx of confirmed mental health diagnoses. She cites her two daughters and her friend Lauren Chapman (who lives in [place]) as her supports. She currently lives with her 31yo daughter and grandchildren (ages 105 and 3 weeks). She reports previous alcohol abuse in her teens and early 27s. She reports medical hx of migraines, overactive bladder  with implanted bladder stimulator, asthma, and emphysema.  Pt stepped down from virtual PHP to virtual MH-IOP today.   States PHP was helpful.  Pt continues to fight thru the depression and on and off passive SI due to her daughter moving away.  Pt denies any SI today.  She also  denies HI and V-hallucinations.  Admits to ongoing A-hallucinations.  On a scale of 1-10 (10 being the worst); pt rates her depression at a 8 and anxiety at a 9.  Pt attended all scheduled days, along with a few extra in MH-IOP (total #18).  Reports overall feeling much better; admits to decreased anxiety and depression.  On a scale of 1-10 (10 being the worst) pt rates her anxiety as a 4 and depression as a 1.  Denies SI/HI.  Admits to continued A/V hallucinations.  "I hear my name being called all the time and I see colors."  Pt denies any commanding voices. "I just feel a lot better.  PHP was helpful, but I needed IOP.  Everyone is saying that I am different person."  Pt states she enjoyed Lauren Chapman's groups and all the guest speakers. A:  Discharge today.  F/U with Lauren Chapman next week and pt states she will reach out to Lauren Norins, LCSW for an appt.  Strongly recommended support groups through [facility] [employer] [facility] [employer].   Pt was advised of ROI must be obtained prior to any records release in order to collaborate her care with an outside provider.  Pt was advised if she has not already done so to contact the front desk to sign all necessary forms in order for MH-IOP to release info re: her care.  Consent:  Pt gives verbal consent for tx and assignment of benefits for services provided during this telehealth group process.  Pt expressed understanding and agreed to proceed. Collaboration of care:  Collaborate with Lauren Jacks, NP AEB; Dr. Carlyn Chapman AEB; Lauren Chapman AEB; [employer], LCSW AEB and [employer], LCSW AEB.   R:  Pt receptive.   Lauren Chapman, M.Ed,CNA

## 2023-04-26 NOTE — Patient Instructions (Signed)
 D:  Patient successfully completed virtual MH-IOP today.  A:  Discharge today.  Follow up with Dr. Chester Holstein next week and patient will call Lyn Norins, LCSW for an appointment.  Strongly recommend groups through [facility] [phone]) and [facility].  R:  Patient receptive.

## 2023-04-26 NOTE — Progress Notes (Signed)
 Virtual Visit via Video Note   I connected with Lauren Chapman on 04/26/23 at  9:00 AM EDT by a video enabled telemedicine application and verified that I am speaking with the correct person using two identifiers.   At orientation to the IOP program, Case Manager discussed the limitations of evaluation and management by telemedicine and the availability of in person appointments. The patient expressed understanding and agreed to proceed with virtual visits throughout the duration of the program.   Location:  Patient: Patient Home Provider: OPT BH Office   History of Present Illness: MDD and GAD   Observations/Objective: Check In: Case Manager checked in with all participants to review discharge dates, insurance authorizations, work-related documents and needs from the treatment team regarding medications. Lauren Chapman stated needs and engaged in discussion.    Initial Therapeutic Activity: Counselor facilitated a check-in with Lauren Chapman to assess for safety, sobriety and medication compliance.  Counselor also inquired about Lauren Chapman's current emotional ratings, as well as any significant changes in thoughts, feelings or behavior since previous check in.  Lauren Chapman presented for session on time and was alert, oriented x5, with no evidence or self-report of active SI/HI or A/V H.  Lauren Chapman reported compliance with medication and denied use of alcohol or illicit substances.  Lauren Chapman reported scores of 1/10 for depression, 4/10 for anxiety, and 0/10 for anger/irritability.  Lauren Chapman denied any recent outbursts or panic attacks.  Lauren Chapman reported that a struggle has been helping her daughter, who has been having to borrow the car more often.  Lauren Chapman reported that a success was completing several tasks on her to-do list yesterday.  Lauren Chapman reported that her goal today is to work a shift at her job in the afternoon.          Second Therapeutic Activity: Counselor engaged the group in discussion on managing work/life balance  today to improve mental health and wellness.  Counselor explained how finding balance between responsibilities at home and work place can be challenging, and lead to increased stress.  Counselor facilitated discussion on what challenges members are currently, or have historically faced.  Counselor also discussed strategies for improving work/life balance while members work on their mental health during treatment.  Some of these included keeping track of time management; creating a list of priorities and scaling importance; setting realistic, measurable goals each day; establishing boundaries; taking care of health needs; and nurturing relationships at home and work for support.  Counselor inquired about areas where members feel they are excelling, as well as areas they could focus on during treatment. Intervention was effective, as evidenced by Lauren Davenport actively participating in discussion on topic and reporting that her current job at [employer] has been very stressful due to them going out of business and having a hectic schedule that exposes her to [employer].  Lauren Chapman reported that she experienced several symptoms of burnout, including feeling tired and drained, changing sleep habits, lack of motivation, self-doubt, and procrastination.  Lauren Chapman reported that there have also been numerous warning signs such as pulling out of social engagements, skipping breaks, and neglecting other aspects of her life such as exercise due to work engagements.  Lauren Chapman was receptive to suggestions offered today for addressing work life imbalance, including keeping an activity time log for 1 week to build insight into work habits, making to do lists each day to improve daily routine and avoid overwhelming herself with too many tasks, and learning to say "No" to other coworkers taking advantage of her kindness.  Assessment and Plan: Lauren Chapman has completed MHIOP and will be discharged today.  Counselor recommends adherence to  crisis/safety plan, taking medications as prescribed, and following up with medical professionals if any issues arise.    Follow Up Instructions: Lauren Chapman was advised to call back or seek an in-person evaluation if the symptoms worsen or if the condition fails to improve as anticipated.   Collaboration of Care:   Medication Management AEB Hillery Jacks, NP or Dr. Carlyn Reichert                                          Case Manager AEB Jeri Modena, CNA    Patient/Guardian was advised Release of Information must be obtained prior to any record release in order to collaborate their care with an outside provider. Patient/Guardian was advised if they have not already done so to contact the registration department to sign all necessary forms in order for [place] to release information regarding their care.    Consent: Patient/Guardian gives verbal consent for treatment and assignment of benefits for services provided during this visit. Patient/Guardian expressed understanding and agreed to proceed.   I provided 180 minutes of non-face-to-face time during this encounter.   Noralee Stain, LCSW, LCAS 04/26/23

## 2023-04-27 ENCOUNTER — Other Ambulatory Visit (HOSPITAL_COMMUNITY)

## 2023-04-30 ENCOUNTER — Other Ambulatory Visit (HOSPITAL_COMMUNITY)

## 2023-05-01 ENCOUNTER — Other Ambulatory Visit (HOSPITAL_COMMUNITY)

## 2023-05-02 ENCOUNTER — Other Ambulatory Visit (HOSPITAL_COMMUNITY)

## 2023-05-03 ENCOUNTER — Other Ambulatory Visit (HOSPITAL_COMMUNITY)

## 2023-05-03 ENCOUNTER — Ambulatory Visit: Admitting: Family Medicine

## 2023-05-04 ENCOUNTER — Other Ambulatory Visit (HOSPITAL_COMMUNITY)

## 2023-05-24 ENCOUNTER — Encounter: Payer: Self-pay | Admitting: Neurology

## 2023-05-28 ENCOUNTER — Encounter: Payer: Self-pay | Admitting: Family Medicine

## 2023-05-28 ENCOUNTER — Ambulatory Visit (INDEPENDENT_AMBULATORY_CARE_PROVIDER_SITE_OTHER): Admitting: Family Medicine

## 2023-05-28 ENCOUNTER — Ambulatory Visit (INDEPENDENT_AMBULATORY_CARE_PROVIDER_SITE_OTHER)
Admission: RE | Admit: 2023-05-28 | Discharge: 2023-05-28 | Disposition: A | Discharge status: Home / Self Care | Source: Ambulatory Visit | Attending: Family Medicine | Admitting: Family Medicine

## 2023-05-28 VITALS — BP 126/68 | HR 52 | Temp 98.0°F | Ht 63.0 in | Wt 174.4 lb

## 2023-05-28 DIAGNOSIS — M79604 Pain in right leg: Secondary | ICD-10-CM | POA: Diagnosis not present

## 2023-05-28 DIAGNOSIS — M79661 Pain in right lower leg: Secondary | ICD-10-CM | POA: Diagnosis not present

## 2023-05-28 DIAGNOSIS — M7918 Myalgia, other site: Secondary | ICD-10-CM

## 2023-05-28 DIAGNOSIS — M25561 Pain in right knee: Secondary | ICD-10-CM | POA: Diagnosis not present

## 2023-05-28 DIAGNOSIS — M25551 Pain in right hip: Secondary | ICD-10-CM | POA: Diagnosis not present

## 2023-05-28 MED ORDER — TRAMADOL HCL 50 MG PO TABS
50.0000 mg | ORAL_TABLET | Freq: Three times a day (TID) | ORAL | 0 refills | Status: AC | PRN
Start: 2023-05-28 — End: 2023-06-02

## 2023-05-28 NOTE — Progress Notes (Signed)
 Subjective:  Patient ID: Lauren Chapman, female    DOB: 1970/06/10  Age: 53 y.o. MRN: [id number]  CC:  Chief Complaint  Patient presents with   Leg Pain    Pt notes pain in her Rt leg since about Nov-Dec and notes she has a aching and throbbing feeling, pt notes this has been constant, notes she is having trouble sleeping,     HPI [name] presents for   Right leg pain: Present now for approximately 5 to 6 months. No preceding fall, injury, or change in activity.  Started with light discomfort - front of R leg - hip to R ankle, but more pain in R knee with lying down. Some aches in entire R leg with walking, but not limiting activity, just sore to walk. More painful to lie down.  Ache, throbbing pain.  No back pain, but some soreness in lower R buttock area.  No numbness, weakness of leg or rash.  No bowel or bladder incontinence, no saddle anesthesia, no lower extremity weakness.  No fever, night sweats, or unintentional weight loss - has lost weight, but intentional.   Tx: tizanidine  for back nightly - min relief. No change in leg symptoms. No relief with otc advil /tylenol . Tried mobic  for back for a month. Helped back, no change in leg pain. Leg pain preceded left sided back pain ( seen in March). Leg pain seems to be getting worse.   Controlled substance database (PDMP) reviewed. Xanax #90 on 11/30/22 - denies recent use/need.  Still taking gabapentin  800mg  tid for anxiety. Same dose.  On tegretol from psychiatry, no hx of seizure.  Has taken tramadol  without reaction prior - took after foot surgery.   History Patient Active Problem List   Diagnosis Date Noted   Morbid obesity (HCC) 05/22/2023   Schizoaffective disorder (HCC) 03/01/2023   Asthma 02/15/2023   Intractable chronic migraine with aura with status migrainosus 02/15/2023   Overactive bladder 02/15/2023   Poorly controlled mild persistent asthma 09/05/2021   Syncope 08/02/2021   CAP (community acquired  pneumonia) 07/18/2021   OSA (obstructive sleep apnea) 07/18/2021   Hypokalemia 07/18/2021   Depression 03/24/2020   Left lumbar radiculopathy 02/25/2020   Absence of bladder continence 02/25/2020   History of postmenopausal bleeding 04/29/2019   Polypharmacy 04/03/2019   Acute low back pain 07/31/2018   Emphysema, unspecified (HCC) 05/17/2018   Weakness 01/25/2017   Chronic migraine w/o aura w/o status migrainosus, not intractable 12/22/2014   Encounter for other general counseling or advice on contraception 09/02/2014   Bipolar disorder (HCC) 10/12/2011   Anxiety 10/12/2011   GERD (gastroesophageal reflux disease) 10/12/2011   Past Medical History:  Diagnosis Date   Allergy    Anxiety    Bipolar affective psychosis (HCC)    Complication of anesthesia    " Acting silly- waving at everyone"-Giddy   Depression    Emphysema lung (HCC)    inhaler prn,  followed by pcp and pulmonologist-- dr Almyra Arn   Family history of adverse reaction to anesthesia    Father had rheumatic fever, and died on operating table.   GERD (gastroesophageal reflux disease)    Kidney cysts 01/24/2020   Right Kidney   Migraine    Moderate persistent asthma 09/05/2021   Smokers' cough (HCC)    per pt not productive   SUI (stress urinary incontinence, female)    Tubular adenoma of colon 07/08/2020   Wears glasses    Past Surgical History:  Procedure Laterality  Date   ABDOMINAL HYSTERECTOMY N/A    Phreesia 11/30/2019   CARPAL TUNNEL RELEASE Right 09-12-2007   @[facility]    and GANGLION CYST EXCISION   COLONOSCOPY     CYSTOSCOPY N/A 04/29/2019   Procedure: CYSTOSCOPY;  Surgeon: Mallory Seaman, MD;  Location: [facility];  Service: Gynecology;  Laterality: N/A;   DILATATION & CURETTAGE/HYSTEROSCOPY WITH MYOSURE N/A 11/13/2018   Procedure: DILATATION & CURETTAGE/HYSTEROSCOPY WITH MYOSURE;  Surgeon: Lacretia Piccolo, MD;  Location: [facility];  Service: Gynecology;   Laterality: N/A;  request 9:00am OR start time in [place] [facility] block requests one hour   DILATION AND CURETTAGE OF UTERUS  02/2019   EYE SURGERY N/A    Phreesia 11/30/2019   GANGLION CYST EXCISION     R wrist   interstem      implant for overactive bladder   KNEE ARTHROSCOPY WITH ANTERIOR CRUCIATE LIGAMENT (ACL) REPAIR Right 10/2015   LAPAROSCOPIC CHOLECYSTECTOMY  1997   LAPAROSCOPIC HYSTERECTOMY Bilateral 04/29/2019   Procedure: HYSTERECTOMY TOTAL LAPAROSCOPIC, BILATERAL SALPINO-OOPHORECTOMY;  Surgeon: Mallory Seaman, MD;  Location: [facility] [place];  Service: Gynecology;  Laterality: Bilateral;   LAPAROSCOPIC TOTAL HYSTERECTOMY     TOOTH EXTRACTION  04/01/2019   tooth removal Bilateral 2022   4 teeth removed   UPPER GASTROINTESTINAL ENDOSCOPY     Allergies  Allergen Reactions   Codeine Anaphylaxis and Other (See Comments)    Cannot have ANYTHING with codeine   Prior to Admission medications   Medication Sig Start Date End Date Taking? Authorizing Provider  albuterol  (VENTOLIN  HFA) 108 (90 Base) MCG/ACT inhaler Inhale 2 puffs into the lungs every 4 (four) hours as needed for wheezing or shortness of breath. 07/18/22  Yes Cobb, Mariah Shines, NP  budesonide -formoterol  (SYMBICORT ) 160-4.5 MCG/ACT inhaler Inhale 2 puffs into the lungs 2 (two) times daily. 07/18/22  Yes Cobb, Mariah Shines, NP  busPIRone  (BUSPAR ) 15 MG tablet Take 1 tablet (15 mg total) by mouth 3 (three) times daily. 03/09/23  Yes Levester Reagin, NP  carbamazepine (TEGRETOL) 100 MG chewable tablet Chew 100 mg by mouth 2 (two) times daily. 03/28/23  Yes [provider]  EPINEPHrine  0.3 mg/0.3 mL IJ SOAJ injection Inject 0.3 mg into the muscle as needed for anaphylaxis. 03/29/21  Yes Adel Aden, PA-C  esomeprazole  (NEXIUM ) 20 MG capsule Take 1 capsule (20 mg total) by mouth 2 (two) times daily before a meal. 03/21/19  Yes Elyce Hams, Marguerita Shih, MD  gabapentin  (NEURONTIN ) 400 MG capsule Take 2  capsules (800 mg total) by mouth 3 (three) times daily. 04/02/23  Yes Augusta Blizzard, MD  metoprolol tartrate (LOPRESSOR) 25 MG tablet Take 25 mg by mouth 2 (two) times daily.   Yes [provider]  MYRBETRIQ  50 MG TB24 tablet Take 50 mg by mouth daily. 11/11/21  Yes [provider]  ondansetron  (ZOFRAN ) 4 MG tablet Take 1 tablet (4 mg total) by mouth every 8 (eight) hours as needed for nausea or vomiting. 01/05/20  Yes Wess Hammed, NP  Rimegepant Sulfate (NURTEC) 75 MG TBDP Take 1 tablet (75 mg total) by mouth daily as needed. 09/18/22  Yes Jaffe, Adam R, DO  SUMAtriptan  (IMITREX ) 6 MG/0.5ML SOLN injection Inject 0.5 mLs (6 mg total) into the skin See admin instructions. INJECT 6 MG (0.5 ml) INTO THE SKIN AS NEEDED FOR MIGRAINE OR HEADACHE. MAY REPEAT ONCE IN 2 HOURS IF HEADACHE PERSISTS OR RECURS. MAX 2 DOSES IN 24 HOURS. 02/17/22  Yes  Merriam Abbey, DO  thiothixene (NAVANE) 10 MG capsule Take 20 mg by mouth at bedtime. 04/06/23  Yes [provider]  tiZANidine  (ZANAFLEX ) 4 MG tablet Take 1 tablet (4 mg total) by mouth every 6 (six) hours as needed for muscle spasms. 04/19/23  Yes Ether Hercules, MD  citalopram  (CELEXA ) 40 MG tablet Take 1 tablet (40 mg total) by mouth daily. 04/16/23 05/16/23  Augusta Blizzard, MD  meloxicam  (MOBIC ) 15 MG tablet Take 1 tablet (15 mg total) by mouth daily as needed for pain. Patient not taking: Reported on 05/28/2023 04/19/23   Ether Hercules, MD   Social History   Socioeconomic History   Marital status: Divorced    Spouse name: n/a   Number of children: 2   Years of education: 22   Highest education level: Master's degree (e.g., MA, MS, MEng, MEd, MSW, MBA)  Occupational History   Occupation: Disabled    Comment: Formerly a Runner, broadcasting/film/video.  Tobacco Use   Smoking status: Former    Current packs/day: 0.00    Average packs/day: 1 pack/day for 39.8 years (39.8 ttl pk-yrs)    Types: Cigarettes    Start date: 10/10/1981    Quit date:  07/18/2021    Years since quitting: 1.8    Passive exposure: Never   Smokeless tobacco: Never  Vaping Use   Vaping status: Never Used  Substance and Sexual Activity   Alcohol use: Never    Alcohol/week: 0.0 standard drinks of alcohol   Drug use: Never   Sexual activity: Not Currently    Partners: Male    Birth control/protection: Post-menopausal    Comment: 1st intercourse 53 yo-More than 5 partners  Other Topics Concern   Not on file  Social History Narrative   Lives at home with 1 of her two daughters   Right-handed.   2-4 cups caffeine daily.   Disabled    One story home   Social Drivers of Health   Financial Resource Strain: Medium Risk (04/18/2023)   Overall Financial Resource Strain (CARDIA)    Difficulty of Paying Living Expenses: Somewhat hard  Food Insecurity: Food Insecurity Present (04/18/2023)   Hunger Vital Sign    Worried About Running Out of Food in the Last Year: Often true    Ran Out of Food in the Last Year: Sometimes true  Transportation Needs: No Transportation Needs (04/18/2023)   PRAPARE - Administrator, Civil Service (Medical): No    Lack of Transportation (Non-Medical): No  Physical Activity: Inactive (04/18/2023)   Exercise Vital Sign    Days of Exercise per Week: 0 days    Minutes of Exercise per Session: 0 min  Stress: Stress Concern Present (04/18/2023)   [employer] of Occupational Health - Occupational Stress Questionnaire    Feeling of Stress : Very much  Social Connections: Socially Isolated (04/18/2023)   Social Connection and Isolation Panel [NHANES]    Frequency of Communication with Friends and Family: More than three times a week    Frequency of Social Gatherings with Friends and Family: Three times a week    Attends Religious Services: Never    Active Member of Clubs or Organizations: No    Attends Banker Meetings: Never    Marital Status: Divorced  Catering manager Violence: Not At Risk (03/02/2023)    Humiliation, Afraid, Rape, and Kick questionnaire    Fear of Current or Ex-Partner: No    Emotionally Abused: No    Physically Abused:  No    Sexually Abused: No    Review of Systems Per HPI  Objective:   Vitals:   05/28/23 1010  BP: 126/68  Pulse: (!) 52  Temp: 98 F (36.7 C)  TempSrc: Temporal  SpO2: 97%  Weight: 174 lb 6.4 oz (79.1 kg)  Height: 5\' 3"  (1.6 m)     Physical Exam Vitals reviewed.  Constitutional:      General: She is not in acute distress.    Appearance: Normal appearance. She is well-developed.  HENT:     Head: Normocephalic and atraumatic.  Cardiovascular:     Rate and Rhythm: Normal rate.  Pulmonary:     Effort: Pulmonary effort is normal.  Musculoskeletal:     Comments: Lumbar spine, no midline bony tenderness.  Able to heel and toe walk without difficulty, negative seated straight leg raise.  Slight discomfort into the right hip, thigh with right rotation, right lateral flexion.  Right hip, discomfort into the upper thigh with internal rotation, pain-free external rotation.  Right thigh, minimal diffuse discomfort anterior thigh.  Right knee, no apparent joint effusion.  Minimal tenderness at the medial joint line only with normal range of motion.  Minimal discomfort with varus testing.  Lower leg, minimal diffuse discomfort throughout the anterior tibia.  No focal soft tissue swelling or defect appreciated.    Skin:    General: Skin is warm and dry.  Neurological:     Mental Status: She is alert and oriented to person, place, and time.  Psychiatric:        Mood and Affect: Mood normal.      Assessment & Plan:  DEVITA HEWATT is a 53 y.o. female . Right leg pain - Plan: DG Lumbar Spine 2-3 Views, DG HIP UNILAT W OR W/O PELVIS 2-3 VIEWS RIGHT, DG FEMUR, MIN 2 VIEWS RIGHT, DG Knee 1-2 Views Right, DG Tibia/Fibula Right  Right buttock pain - Plan: DG Lumbar Spine 2-3 Views  Right hip pain - Plan: DG HIP UNILAT W OR W/O PELVIS 2-3 VIEWS  RIGHT  Right knee pain, unspecified chronicity - Plan: DG Knee 1-2 Views Right  Pain in right lower leg - Plan: DG Tibia/Fibula Right Various areas of discomfort, no known injury.  Progression of symptoms, most notable with lying down.  Question lumbar spine source given description and location of symptoms throughout leg.  Does have some focal areas of discomfort as above and we will check some baseline imaging of hip, knee, tib-fib and lumbar spine.  No change in symptoms with NSAIDs/Mobic  or muscle relaxant, and already on gabapentin  without significant change in symptoms.  Agreed on short-term tramadol  for now, tolerated previously.  Potential side effects and risks discussed, recheck in 2 weeks to discuss next step.  Meds ordered this encounter  Medications   traMADol  (ULTRAM ) 50 MG tablet    Sig: Take 1 tablet (50 mg total) by mouth every 8 (eight) hours as needed for up to 5 days.    Dispense:  15 tablet    Refill:  0   There are no Patient Instructions on file for this visit.    Signed,   Caro Christmas, MD [facility], [facility] 05/28/23 11:22 AM

## 2023-05-29 ENCOUNTER — Ambulatory Visit (AMBULATORY_SURGERY_CENTER): Admitting: *Deleted

## 2023-05-29 ENCOUNTER — Encounter: Payer: Self-pay | Admitting: Family Medicine

## 2023-05-29 VITALS — Ht 63.0 in | Wt 174.0 lb

## 2023-05-29 DIAGNOSIS — Z860100 Personal history of colon polyps, unspecified: Secondary | ICD-10-CM

## 2023-05-29 MED ORDER — SUTAB 1479-225-188 MG PO TABS: 24.0000 | ORAL_TABLET | Freq: Once | ORAL | 0 refills | Status: AC

## 2023-05-29 NOTE — Progress Notes (Signed)
 Pre visit conducted over telephone.  Instructions forwarded through MyChart.  No egg or soy allergy known to patient  No issues known to pt with past sedation with any surgeries or procedures Patient denies ever being told they had issues or difficulty with intubation  No FH of Malignant Hyperthermia Pt is not on diet pills Pt is not on  home 02  Pt is not on blood thinners  Pt denies issues with constipation  No A fib or A flutter Have any cardiac testing pending-  NO Pt instructed to use [email or web address] or GoodRx for a price reduction on prep

## 2023-05-30 ENCOUNTER — Other Ambulatory Visit (HOSPITAL_COMMUNITY)
Admission: RE | Admit: 2023-05-30 | Discharge: 2023-05-30 | Disposition: A | Discharge status: Home / Self Care | Source: Ambulatory Visit | Attending: Radiology | Admitting: Radiology

## 2023-05-30 ENCOUNTER — Ambulatory Visit (INDEPENDENT_AMBULATORY_CARE_PROVIDER_SITE_OTHER): Admitting: Radiology

## 2023-05-30 ENCOUNTER — Encounter: Payer: Self-pay | Admitting: Neurology

## 2023-05-30 ENCOUNTER — Encounter: Payer: Self-pay | Admitting: Radiology

## 2023-05-30 VITALS — BP 100/70 | HR 53 | Ht 63.5 in | Wt 174.3 lb

## 2023-05-30 DIAGNOSIS — Z90710 Acquired absence of both cervix and uterus: Secondary | ICD-10-CM | POA: Diagnosis not present

## 2023-05-30 DIAGNOSIS — Z01419 Encounter for gynecological examination (general) (routine) without abnormal findings: Secondary | ICD-10-CM

## 2023-05-30 DIAGNOSIS — Z7251 High risk heterosexual behavior: Secondary | ICD-10-CM | POA: Diagnosis not present

## 2023-05-30 DIAGNOSIS — Z1151 Encounter for screening for human papillomavirus (HPV): Secondary | ICD-10-CM | POA: Diagnosis not present

## 2023-05-30 DIAGNOSIS — Z9189 Other specified personal risk factors, not elsewhere classified: Secondary | ICD-10-CM

## 2023-05-30 NOTE — Progress Notes (Signed)
   Lauren Chapman 1970/06/14 [id number]   History:  53 y.o. G3P2 presents for annual exam. S/p hyst BSO for bleeding 2021. No gyn concerns.   Gynecologic History Hysterectomy: 2021 persistent bleeding  Sexually active: no  Health Maintenance Last Pap: 2019. Results were: normal with endometrial cells Last mammogram: 02/2023. Results were: normal Last colonoscopy: 2022    Risk Factors for Medicare Patients >/= 5 sexual partners in a lifetime: Yes First intercourse <21 years of age: No H/O STD at any age: No Abnormal pap smear, < 3 negative paps within the last 7 years: No DES exposure (women born between [phone]): No Patient is on post breast cancer medication like Femara or, if medication like this is not needed, 5 years post breast cancer diagnosis: No      Past medical history, past surgical history, family history and social history were all reviewed and documented in the EPIC chart.  ROS:  A ROS was performed and pertinent positives and negatives are included.  Exam:  Vitals:   05/30/23 1327  BP: 100/70  Pulse: (!) 53  SpO2: 97%  Weight: 174 lb 4.8 oz (79.1 kg)  Height: 5' 3.5" (1.613 m)   Body mass index is 30.39 kg/m.  General appearance:  Normal Thyroid :  Symmetrical, normal in size, without palpable masses or nodularity. Respiratory  Auscultation:  Clear without wheezing or rhonchi Cardiovascular  Auscultation:  Regular rate, without rubs, murmurs or gallops  Edema/varicosities:  Not grossly evident Abdominal  Soft,nontender, without masses, guarding or rebound.  Liver/spleen:  No organomegaly noted  Hernia:  None appreciated  Skin  Inspection:  Grossly normal Breasts: Examined lying and sitting.   Right: Without masses, retractions, nipple discharge or axillary adenopathy.   Left: Without masses, retractions, nipple discharge or axillary adenopathy. Genitourinary   Inguinal/mons:  Normal without inguinal adenopathy  External genitalia:  Normal  appearing vulva with no masses, tenderness, or lesions  BUS/Urethra/Skene's glands:  Normal  Vagina:  Normal appearing with normal color and discharge, no lesions. Atrophy mild  Cervix:  absent  Uterus:  absent  Adnexa/parametria:  ovaries surgically absent. No adnexal masses  Anus and perineum: Normal   Ellis Guys, CMA present for exam  Assessment/Plan:   1. Encounter for breast and pelvic examination (Primary) - Cytology - PAP( [place])  2. High risk heterosexual behavior Return 1 year for breast and pelvic exam    Return in 1 year for annual or sooner prn.  Synetta Eves B WHNP-BC 1:55 PM 05/30/2023

## 2023-06-04 LAB — CYTOLOGY - PAP
Comment: NEGATIVE
Diagnosis: NEGATIVE
High risk HPV: NEGATIVE

## 2023-06-05 ENCOUNTER — Ambulatory Visit: Payer: Self-pay | Admitting: Radiology

## 2023-06-05 ENCOUNTER — Encounter: Payer: Self-pay | Admitting: Neurology

## 2023-06-20 ENCOUNTER — Encounter: Payer: Self-pay | Admitting: Family Medicine

## 2023-06-20 ENCOUNTER — Ambulatory Visit (INDEPENDENT_AMBULATORY_CARE_PROVIDER_SITE_OTHER): Admitting: Family Medicine

## 2023-06-20 VITALS — BP 110/78 | HR 49 | Ht 63.5 in | Wt 170.4 lb

## 2023-06-20 DIAGNOSIS — M79604 Pain in right leg: Secondary | ICD-10-CM

## 2023-06-20 DIAGNOSIS — Z8739 Personal history of other diseases of the musculoskeletal system and connective tissue: Secondary | ICD-10-CM

## 2023-06-20 DIAGNOSIS — M25561 Pain in right knee: Secondary | ICD-10-CM | POA: Diagnosis not present

## 2023-06-20 NOTE — Progress Notes (Unsigned)
 NEUROLOGY FOLLOW UP OFFICE NOTE  Lauren Chapman [id number]  Assessment/Plan:     Migraine with aura, without status migrainosus, not intractable    Migraine prevention:  Vyepti  300mg  Q28m *** Migraine rescue:  Nurtec to try as well.  May take with Zofran  and tizanidine .  Sumatriptan  60mg  [place] second line. *** Magnesium  citrate 400mg  qd, riboflavin 400mg  qd, CoQ-10 100mg  TID *** Discussed diet/caffeine cessation/exercise Continue use of CPAP.  Followed by sleep clinic at [facility] Keep headache diary Follow up ***       Subjective:  Lauren Chapman is a 53 year old right-handed female with emphysema, Bipolar disorder, depression, anxiety, and history of tubular adenoma of the colon who follows up for migraines.    UPDATE: *** Intensity:  3-4/10 Duration:  usually 15-30 minutes with Nurtec (with or without Zofran  and tizanidine ) as first line and sumatriptan  [place] second line *** Frequency:  2 tension headaches for first month, 1 a week (or 2 every 2 weeks) for second month (3 were "migraines"), 4 to 2 days third month but more severe (dizzy, nausea, vomiting) in which they lasted 1 hour after 2 second injection ***  Frequency of abortive medication: 6 days a month Current NSAIDS/analgesics:  ASA Current triptans:  Sumatriptan  6mg  [place] Current ergotamine:  none Current anti-emetic:  Zofran  4mg  Current muscle relaxants:  tizanidine  4mg  Current Antihypertensive medications:  none Current Antidepressant medications:  citalopram  20mg  daily Current Anticonvulsant medications:  gabapentin  800mg  three times daily, lamotrigine  150mg  BID Current anti-CGRP:  Vyepti  300mg  every 3 months Current Vitamins/Herbal/Supplements:  magnesium  citrate 400mg  daily, riboflavin 400mg  daily, CoQ-10 100mg  TID.   Current Antihistamines/Decongestants:  none Other therapy:  none Hormone/birth control:  none Other medications:  Abilify , buspirone , benztropine    Caffeine:  3 pods a day of decaf coffee.  No longer  drinks cola Smoking:  quit Diet:  Drinks Bubbly but no water.  Sometimes Sprite.  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  Eats junk food. Eats meat/chicken with starch vegetables/potatoes Exercise:  no Depression:  yes; Anxiety:  yes Other pain:  Chronic back pain, lumbar radicular pain.  Sleep hygiene:  Diagnosed with OSA in June.  On CPAP but still without rested sleep.  Sleepwalks.  Reports increased sleepwalking.     HISTORY:  Onset:  53 years old Location:  left sided (frontal/retro-orbital) Quality:  pounding Intensity:  9-10/10.   Aura:  sometimes headaches preceded by visual aura (fuzzy vision/floaters in left eye) and phantosmia (metallic smell) Prodrome:  absent Associated symptoms:  Nausea, vomiting, photophobia, phonophobia, dizziness (once every 2 months).  She denies associated unilateral numbness or weakness. Duration:  1-2 days, sometimes 4-5 days (years ago would last 1-2 weeks) Frequency:  4 days a week Frequency of abortive medication: 5 days a week switches between sumatriptan  and Excedrin Migraine Triggers/aggravating factors:  not eating, stress, worse when laying down Relieving factors:  nothing Activity:  aggravates   History of medication overuse leading to daily headaches in 2021. MRI of brain without contrast on 03/16/2020 revealed minimal nonspecific scattered hyperintense foci within the bilateral periventricular and subcortical white matter, stable compared to prior MRI from 02/06/2017.   History of chronic low back pain.  In 2021 reported worsening left lower back pain with radicular pain down the left leg causing left leg weakness.  Also endorsed bowel and bladder incontinence.  MRI of lumbar spine without contrast on 03/09/2020 personally reviewed showed no spinal or foraminal stenosis.  MRI of  cervical spine on 03/09/2020 personally reviewed showed mild spondylosis and disc bulging from C3-4 to C6-7 with mild left foraminal stenosis  at C4-5 but otherwise no spinal canal stenosis or foraminal stenosis.  MRI of thoracic spine on 03/16/2020 personally reviewed was unremarkable.         Past NSAIDS/analgesics:  ibuprofen , naproxen , BC powder, diclofenac  75mg , meloxicam , Excedrin Past abortive triptans:  rizatriptan  10mg , sumatriptan  20mg  NS/tab Past abortive ergotamine:  none Past muscle relaxants:  methocarbamol , cyclobenzaprine Past anti-emetic:  none Past antihypertensive medications:  propranolol  Past antidepressant medications:  amitriptyline, imipramine, sertraline , mirtazapine , Wellbutrin , vortioxetine, vilazodone Past anticonvulsant medications:  none Past anti-CGRP:  Aimovig , Ajovy , Emgality  Past vitamins/Herbal/Supplements:  none Past antihistamines/decongestants:  Flonase  Other past therapies:  Botox     Family history of headache:  daughters (migraines)  PAST MEDICAL HISTORY: Past Medical History:  Diagnosis Date   Allergy    Anxiety    Bipolar affective psychosis (HCC)    Complication of anesthesia    " Acting silly- waving at everyone"-Giddy   Depression    Emphysema lung (HCC)    inhaler prn,  followed by pcp and pulmonologist-- dr Almyra Arn   Family history of adverse reaction to anesthesia    Father had rheumatic fever, and died on operating table.   GERD (gastroesophageal reflux disease)    Kidney cysts 01/24/2020   Right Kidney   Migraine    Moderate persistent asthma 09/05/2021   Sleep apnea    Smokers' cough (HCC)    per pt not productive   SUI (stress urinary incontinence, female)    Tubular adenoma of colon 07/08/2020   Wears glasses     MEDICATIONS: Current Outpatient Medications on File Prior to Visit  Medication Sig Dispense Refill   albuterol  (VENTOLIN  HFA) 108 (90 Base) MCG/ACT inhaler Inhale 2 puffs into the lungs every 4 (four) hours as needed for wheezing or shortness of breath. 1 each 1   budesonide -formoterol  (SYMBICORT ) 160-4.5 MCG/ACT inhaler Inhale 2 puffs into the  lungs 2 (two) times daily. 1 each 6   busPIRone  (BUSPAR ) 15 MG tablet Take 1 tablet (15 mg total) by mouth 3 (three) times daily. 30 tablet 0   carbamazepine (TEGRETOL) 100 MG chewable tablet Chew 100 mg by mouth 2 (two) times daily.     citalopram  (CELEXA ) 40 MG tablet Take 1 tablet (40 mg total) by mouth daily. 30 tablet 0   EPINEPHrine  0.3 mg/0.3 mL IJ SOAJ injection Inject 0.3 mg into the muscle as needed for anaphylaxis. 1 each 0   esomeprazole  (NEXIUM ) 20 MG capsule Take 1 capsule (20 mg total) by mouth 2 (two) times daily before a meal. 60 capsule 1   gabapentin  (NEURONTIN ) 400 MG capsule Take 2 capsules (800 mg total) by mouth 3 (three) times daily.     meloxicam  (MOBIC ) 15 MG tablet Take 1 tablet (15 mg total) by mouth daily as needed for pain. (Patient not taking: Reported on 05/28/2023) 30 tablet 0   metoprolol tartrate (LOPRESSOR) 25 MG tablet Take 25 mg by mouth 2 (two) times daily.     MYRBETRIQ  50 MG TB24 tablet Take 50 mg by mouth daily.     ondansetron  (ZOFRAN ) 4 MG tablet Take 1 tablet (4 mg total) by mouth every 8 (eight) hours as needed for nausea or vomiting. 20 tablet 6   Rimegepant Sulfate (NURTEC) 75 MG TBDP Take 1 tablet (75 mg total) by mouth daily as needed. 8 tablet 11   SUMAtriptan  (IMITREX ) 6 MG/0.5ML SOLN  injection Inject 0.5 mLs (6 mg total) into the skin See admin instructions. INJECT 6 MG (0.5 ml) INTO THE SKIN AS NEEDED FOR MIGRAINE OR HEADACHE. MAY REPEAT ONCE IN 2 HOURS IF HEADACHE PERSISTS OR RECURS. MAX 2 DOSES IN 24 HOURS. 3 mL 5   SUTAB  [phone] MG TABS Take by mouth.     thiothixene (NAVANE) 10 MG capsule Take 20 mg by mouth at bedtime.     tiZANidine  (ZANAFLEX ) 4 MG tablet Take 1 tablet (4 mg total) by mouth every 6 (six) hours as needed for muscle spasms. 30 tablet 5   No current facility-administered medications on file prior to visit.    ALLERGIES: Allergies  Allergen Reactions   Codeine Anaphylaxis and Other (See Comments)    Cannot have  ANYTHING with codeine    FAMILY HISTORY: Family History  Problem Relation Age of Onset   Depression Mother    Cancer Mother        Cervical   Alcohol abuse Father    Crohn's disease Sister    Stroke Brother 57   Migraines Daughter    GER disease Daughter    Depression Daughter    Bipolar disorder Daughter    Migraines Daughter    GER disease Daughter    Colon cancer Neg Hx    Rectal cancer Neg Hx    Stomach cancer Neg Hx       Objective:  *** General: No acute distress.  Patient appears well-groomed.   Head:  Normocephalic/atraumatic Neck:  Supple.  No paraspinal tenderness.  Full range of motion. Heart:  Regular rate and rhythm. Neuro:  Alert and oriented.  Speech fluent and not dysarthric.  Language intact.  CN II-XII intact.  Bulk and tone normal.  Muscle strength 5/5 throughout.  Sensation to light touch intact.  Deep tendon reflexes 2+ throughout, toes downgoing.  Gait normal.  Romberg negative.    Janne Members, DO  CC: Caro Christmas, MD

## 2023-06-20 NOTE — Progress Notes (Signed)
 Subjective:  Patient ID: Lauren Chapman, female    DOB: 09/27/1970  Age: 53 y.o. MRN: [id number]  CC:  Chief Complaint  Patient presents with   Leg Pain    2 wk f/u    HPI Lauren Chapman presents for   Right leg pain Discussed at her May 5 visit present for 5 to 6 months at that time.  See details, but in summary had slight discomfort in the front of the right leg from her hip to her ankle, not limiting activity but did have some aching throughout entire leg, sore to walk and more painful with lying down.  She has been using tizanidine  for her back nightly with minimal relief, and no change in leg symptoms.  She had tried Mobic  for a month, helped the back but no change in leg pain and her leg pain had preceded her left-sided back pain.  Of note she was on gabapentin  800 mg 3 times daily for anxiety and Tegretol from psychiatry. Various areas of discomfort without known injury.  Possible lumbar source given her description of location of symptoms throughout the leg.  Without change in symptoms or if NSAIDs Mobic  or muscle relaxant and since she was already on gabapentin  without significant change I did agree on short-term tramadol , imaging ordered.  Right femur and hip imaging with mild bilateral hip arthritic changes but no acute fracture or dislocation no concerns on right knee or right tib-fib imaging.  Lumbar spine imaging with mild degenerative changes, no acute findings, stimulator device noted entering in the sacral region (this was for overactive bladder - placed by urology).   She did experience bilateral low back pain with left-sided sciatica in March 2022.Note reviewed from neurology in August 2024.  Regarding chronic low back pain, 2021 reported worsening left lower back pain with radicular pain down left leg, left leg weakness and bowel/bladder incontinence.  MRI of lumbar spine in February 2022 without spinal or foraminal stenosis.  Since last visit feels about the same.  Sometimes inside of knee, but in entire leg. Worse if lying on either side - Still sore lying down.better if flat.  No new symptoms. No bowel or bladder incontinence, no saddle anesthesia, no lower extremity weakness. Taking tramadol  once per night before bed. No change in symptoms. Prednisone  had some psychiatric side effects prior.  Not debilitating pain.  Not limiting activities.   History Patient Active Problem List   Diagnosis Date Noted   Morbid obesity (HCC) 05/22/2023   Schizoaffective disorder (HCC) 03/01/2023   Asthma 02/15/2023   Intractable chronic migraine with aura with status migrainosus 02/15/2023   Overactive bladder 02/15/2023   Poorly controlled mild persistent asthma 09/05/2021   Syncope 08/02/2021   CAP (community acquired pneumonia) 07/18/2021   OSA (obstructive sleep apnea) 07/18/2021   Hypokalemia 07/18/2021   Depression 03/24/2020   Left lumbar radiculopathy 02/25/2020   Absence of bladder continence 02/25/2020   History of postmenopausal bleeding 04/29/2019   Polypharmacy 04/03/2019   Acute low back pain 07/31/2018   Emphysema, unspecified (HCC) 05/17/2018   Weakness 01/25/2017   Chronic migraine w/o aura w/o status migrainosus, not intractable 12/22/2014   Encounter for other general counseling or advice on contraception 09/02/2014   Bipolar disorder (HCC) 10/12/2011   Anxiety 10/12/2011   GERD (gastroesophageal reflux disease) 10/12/2011   Past Medical History:  Diagnosis Date   Allergy    Anxiety    Bipolar affective psychosis (HCC)    Complication of anesthesia    "  Acting silly- waving at everyone"-Giddy   Depression    Emphysema lung (HCC)    inhaler prn,  followed by pcp and pulmonologist-- dr Almyra Arn   Family history of adverse reaction to anesthesia    Father had rheumatic fever, and died on operating table.   GERD (gastroesophageal reflux disease)    Kidney cysts 01/24/2020   Right Kidney   Migraine    Moderate persistent asthma  09/05/2021   Sleep apnea    Smokers' cough (HCC)    per pt not productive   SUI (stress urinary incontinence, female)    Tubular adenoma of colon 07/08/2020   Wears glasses    Past Surgical History:  Procedure Laterality Date   ABDOMINAL HYSTERECTOMY N/A    Phreesia 11/30/2019   bladder stimulator     CARPAL TUNNEL RELEASE Right 09-12-2007   @[facility]    and GANGLION CYST EXCISION   COLONOSCOPY     CYSTOSCOPY N/A 04/29/2019   Procedure: CYSTOSCOPY;  Surgeon: Mallory Seaman, MD;  Location: [facility] [place];  Service: Gynecology;  Laterality: N/A;   DILATATION & CURETTAGE/HYSTEROSCOPY WITH MYOSURE N/A 11/13/2018   Procedure: DILATATION & CURETTAGE/HYSTEROSCOPY WITH MYOSURE;  Surgeon: Lacretia Piccolo, MD;  Location: [facility];  Service: Gynecology;  Laterality: N/A;  request 9:00am OR start time in [place] [facility] block requests one hour   DILATION AND CURETTAGE OF UTERUS  02/2019   EYE SURGERY N/A    Phreesia 11/30/2019   GANGLION CYST EXCISION     R wrist   GREAT TOE ARTHRODESIS, JONES PROCEDURE     hammer toe 2024   interstem      implant for overactive bladder   KNEE ARTHROSCOPY WITH ANTERIOR CRUCIATE LIGAMENT (ACL) REPAIR Right 10/2015   LAPAROSCOPIC CHOLECYSTECTOMY  1997   LAPAROSCOPIC HYSTERECTOMY Bilateral 04/29/2019   Procedure: HYSTERECTOMY TOTAL LAPAROSCOPIC, BILATERAL SALPINO-OOPHORECTOMY;  Surgeon: Mallory Seaman, MD;  Location: [facility] [place];  Service: Gynecology;  Laterality: Bilateral;   LAPAROSCOPIC TOTAL HYSTERECTOMY     TOOTH EXTRACTION  04/01/2019   tooth removal Bilateral 2022   4 teeth removed   UPPER GASTROINTESTINAL ENDOSCOPY     Allergies  Allergen Reactions   Codeine Anaphylaxis and Other (See Comments)    Cannot have ANYTHING with codeine   Prior to Admission medications   Medication Sig Start Date End Date Taking? Authorizing Provider  albuterol  (VENTOLIN  HFA) 108 (90 Base) MCG/ACT inhaler Inhale 2 puffs  into the lungs every 4 (four) hours as needed for wheezing or shortness of breath. 07/18/22  Yes Cobb, Mariah Shines, NP  budesonide -formoterol  (SYMBICORT ) 160-4.5 MCG/ACT inhaler Inhale 2 puffs into the lungs 2 (two) times daily. 07/18/22  Yes Cobb, Mariah Shines, NP  busPIRone  (BUSPAR ) 15 MG tablet Take 1 tablet (15 mg total) by mouth 3 (three) times daily. 03/09/23  Yes Levester Reagin, NP  carbamazepine (TEGRETOL) 100 MG chewable tablet Chew 100 mg by mouth 2 (two) times daily. 03/28/23  Yes [provider]  DEPAKOTE 125 MG DR tablet  06/13/23  Yes [provider]  EPINEPHrine  0.3 mg/0.3 mL IJ SOAJ injection Inject 0.3 mg into the muscle as needed for anaphylaxis. 03/29/21  Yes Adel Aden, PA-C  esomeprazole  (NEXIUM ) 20 MG capsule Take 1 capsule (20 mg total) by mouth 2 (two) times daily before a meal. 03/21/19  Yes Elyce Hams, Marguerita Shih, MD  gabapentin  (NEURONTIN ) 400 MG capsule Take 2 capsules (800 mg total) by mouth 3 (three) times daily. 04/02/23  Yes  Augusta Blizzard, MD  metoprolol tartrate (LOPRESSOR) 25 MG tablet Take 25 mg by mouth 2 (two) times daily.   Yes [provider]  MYRBETRIQ  50 MG TB24 tablet Take 50 mg by mouth daily. 11/11/21  Yes [provider]  ondansetron  (ZOFRAN ) 4 MG tablet Take 1 tablet (4 mg total) by mouth every 8 (eight) hours as needed for nausea or vomiting. 01/05/20  Yes Wess Hammed, NP  Rimegepant Sulfate (NURTEC) 75 MG TBDP Take 1 tablet (75 mg total) by mouth daily as needed. 09/18/22  Yes Jaffe, Adam R, DO  SUMAtriptan  (IMITREX ) 6 MG/0.5ML SOLN injection Inject 0.5 mLs (6 mg total) into the skin See admin instructions. INJECT 6 MG (0.5 ml) INTO THE SKIN AS NEEDED FOR MIGRAINE OR HEADACHE. MAY REPEAT ONCE IN 2 HOURS IF HEADACHE PERSISTS OR RECURS. MAX 2 DOSES IN 24 HOURS. 02/17/22  Yes Merriam Abbey, DO  SUTAB  [phone] MG TABS Take by mouth. 05/30/23  Yes [provider]  thiothixene (NAVANE) 10 MG capsule Take 20 mg by  mouth at bedtime. 04/06/23  Yes [provider]  tiZANidine  (ZANAFLEX ) 4 MG tablet Take 1 tablet (4 mg total) by mouth every 6 (six) hours as needed for muscle spasms. 04/19/23  Yes Ether Hercules, MD  citalopram  (CELEXA ) 40 MG tablet Take 1 tablet (40 mg total) by mouth daily. 04/16/23 05/30/23  Augusta Blizzard, MD   Social History   Socioeconomic History   Marital status: Divorced    Spouse name: n/a   Number of children: 2   Years of education: 18   Highest education level: Master's degree (e.g., MA, MS, MEng, MEd, MSW, MBA)  Occupational History   Occupation: Disabled    Comment: Formerly a Runner, broadcasting/film/video.  Tobacco Use   Smoking status: Former    Current packs/day: 0.00    Average packs/day: 1 pack/day for 39.8 years (39.8 ttl pk-yrs)    Types: Cigarettes    Start date: 10/10/1981    Quit date: 07/18/2021    Years since quitting: 1.9    Passive exposure: Never   Smokeless tobacco: Never  Vaping Use   Vaping status: Never Used  Substance and Sexual Activity   Alcohol use: Never    Alcohol/week: 0.0 standard drinks of alcohol   Drug use: Never   Sexual activity: Not Currently    Partners: Male    Birth control/protection: Post-menopausal    Comment: menarche 53yo, 1st intercourse 53 yo-More than 5 partners  Other Topics Concern   Not on file  Social History Narrative   Lives at home with 1 of her two daughters   Right-handed.   2-4 cups caffeine daily.   Disabled    One story home   Social Drivers of Health   Financial Resource Strain: Medium Risk (04/18/2023)   Overall Financial Resource Strain (CARDIA)    Difficulty of Paying Living Expenses: Somewhat hard  Food Insecurity: Food Insecurity Present (04/18/2023)   Hunger Vital Sign    Worried About Running Out of Food in the Last Year: Often true    Ran Out of Food in the Last Year: Sometimes true  Transportation Needs: No Transportation Needs (04/18/2023)   PRAPARE - Administrator, Civil Service  (Medical): No    Lack of Transportation (Non-Medical): No  Physical Activity: Inactive (04/18/2023)   Exercise Vital Sign    Days of Exercise per Week: 0 days    Minutes of Exercise per Session: 0 min  Stress: Stress  Concern Present (04/18/2023)   [employer] of Occupational Health - Occupational Stress Questionnaire    Feeling of Stress : Very much  Social Connections: Socially Isolated (04/18/2023)   Social Connection and Isolation Panel [NHANES]    Frequency of Communication with Friends and Family: More than three times a week    Frequency of Social Gatherings with Friends and Family: Three times a week    Attends Religious Services: Never    Active Member of Clubs or Organizations: No    Attends Banker Meetings: Never    Marital Status: Divorced  Catering manager Violence: Not At Risk (03/02/2023)   Humiliation, Afraid, Rape, and Kick questionnaire    Fear of Current or Ex-Partner: No    Emotionally Abused: No    Physically Abused: No    Sexually Abused: No    Review of Systems Per hpi  Objective:   Vitals:   06/20/23 0810  BP: 110/78  Pulse: (!) 49  SpO2: 97%  Weight: 170 lb 6 oz (77.3 kg)  Height: 5' 3.5" (1.613 m)     Physical Exam Constitutional:      General: She is not in acute distress.    Appearance: Normal appearance. She is well-developed.  HENT:     Head: Normocephalic and atraumatic.  Cardiovascular:     Rate and Rhythm: Normal rate.  Pulmonary:     Effort: Pulmonary effort is normal.  Musculoskeletal:     Comments: Lumbar spine, no focal bony tenderness.  Ambulating without assistive device.  Negative seated straight leg raise.  Pain-free hip range of motion.  Right knee, minimal discomfort medial joint line with motion.  Lower leg nontender.  Neurological:     Mental Status: She is alert and oriented to person, place, and time.  Psychiatric:        Mood and Affect: Mood normal.        Assessment & Plan:  Lauren Chapman is a 53 y.o. female . Right leg pain - Plan: Ambulatory referral to Orthopedic Surgery  Right knee pain, unspecified chronicity - Plan: Ambulatory referral to Orthopedic Surgery  History of chronic back pain - Plan: Ambulatory referral to Orthopedic Surgery No change in symptoms with muscle accident, NSAID, tramadol , and with her medical history and side effects of prednisone  previously decided against that at this time given minimal discomfort, not limiting activities.  Could be lumbar source, prior MRI in 2022.  May also have component of arthritic change for meniscal cause of medial knee pain.  Could have multiple contributors.  Will refer to orthopedics to decide on next step, possibly imaging or physical therapy versus injection.  Hold on new meds for now, RTC precautions given.  No orders of the defined types were placed in this encounter.  Patient Instructions  If no change in symptoms using tramadol , I would hold off on that at this point.  No other med changes at this time given no change in symptoms with muscle relaxer, NSAIDs, Mobic  and I am concerned with the possible side effects of prednisone  so we will hold off on that at this time as well.  If any worsening pain, let me know and we can look at other options. I will refer you to orthopedics to decide on next step including possible physical therapy or imaging.  Hang in there!  return to the clinic or go to the nearest emergency room if any of your symptoms worsen or new symptoms occur.  Signed,   Caro Christmas, MD [facility], [facility] 06/20/23 8:58 AM

## 2023-06-20 NOTE — Patient Instructions (Signed)
 If no change in symptoms using tramadol , I would hold off on that at this point.  No other med changes at this time given no change in symptoms with muscle relaxer, NSAIDs, Mobic  and I am concerned with the possible side effects of prednisone  so we will hold off on that at this time as well.  If any worsening pain, let me know and we can look at other options. I will refer you to orthopedics to decide on next step including possible physical therapy or imaging.  Hang in there!  return to the clinic or go to the nearest emergency room if any of your symptoms worsen or new symptoms occur.

## 2023-06-21 ENCOUNTER — Ambulatory Visit (INDEPENDENT_AMBULATORY_CARE_PROVIDER_SITE_OTHER): Payer: 59 | Admitting: Neurology

## 2023-06-21 ENCOUNTER — Encounter: Payer: Self-pay | Admitting: Neurology

## 2023-06-21 VITALS — BP 101/77 | HR 66 | Ht 63.0 in | Wt 171.0 lb

## 2023-06-21 DIAGNOSIS — G43109 Migraine with aura, not intractable, without status migrainosus: Secondary | ICD-10-CM

## 2023-06-21 DIAGNOSIS — G4733 Obstructive sleep apnea (adult) (pediatric): Secondary | ICD-10-CM

## 2023-06-21 NOTE — Patient Instructions (Signed)
 Vyepti  300mg  every 3 months Nurtec once daily as needed.  Zofran  and tizanidine  as well.  Sumatriptan  shot as second line

## 2023-06-27 ENCOUNTER — Encounter: Payer: Self-pay | Admitting: Gastroenterology

## 2023-06-27 ENCOUNTER — Ambulatory Visit: Admitting: Gastroenterology

## 2023-06-27 VITALS — BP 135/87 | HR 67 | Temp 98.1°F | Resp 10 | Ht 63.0 in | Wt 174.0 lb

## 2023-06-27 DIAGNOSIS — K5730 Diverticulosis of large intestine without perforation or abscess without bleeding: Secondary | ICD-10-CM | POA: Diagnosis not present

## 2023-06-27 DIAGNOSIS — D122 Benign neoplasm of ascending colon: Secondary | ICD-10-CM

## 2023-06-27 DIAGNOSIS — Z860100 Personal history of colon polyps, unspecified: Secondary | ICD-10-CM

## 2023-06-27 DIAGNOSIS — Z1211 Encounter for screening for malignant neoplasm of colon: Secondary | ICD-10-CM

## 2023-06-27 DIAGNOSIS — K640 First degree hemorrhoids: Secondary | ICD-10-CM

## 2023-06-27 MED ORDER — SODIUM CHLORIDE 0.9 % IV SOLN
500.0000 mL | Freq: Once | INTRAVENOUS | Status: DC
Start: 2023-06-27 — End: 2023-06-27

## 2023-06-27 NOTE — Progress Notes (Signed)
 Report to PACU, RN, vss, BBS= Clear.

## 2023-06-27 NOTE — Op Note (Signed)
 [facility] Patient Name: Lauren Chapman Procedure Date: 06/27/2023 9:50 AM MRN: [id number] Endoscopist: Lajuan Pila , MD, [id number] Age: 53 Referring MD:  Date of Birth: October 19, 1970 Gender: Female Account #: [id number] Procedure:                Colonoscopy Indications:              High risk colon cancer surveillance: Personal                            history of colonic polyps Medicines:                Monitored Anesthesia Care Procedure:                Pre-Anesthesia Assessment:                           - Prior to the procedure, a History and Physical                            was performed, and patient medications and                            allergies were reviewed. The patient's tolerance of                            previous anesthesia was also reviewed. The risks                            and benefits of the procedure and the sedation                            options and risks were discussed with the patient.                            All questions were answered, and informed consent                            was obtained. Prior Anticoagulants: The patient has                            taken no anticoagulant or antiplatelet agents. ASA                            Grade Assessment: II - A patient with mild systemic                            disease. After reviewing the risks and benefits,                            the patient was deemed in satisfactory condition to                            undergo the procedure.  After obtaining informed consent, the colonoscope                            was passed under direct vision. Throughout the                            procedure, the patient's blood pressure, pulse, and                            oxygen saturations were monitored continuously. The                            Olympus Scope [id number] was introduced through the                            anus and advanced to the the cecum,  identified by                            palpation. The colonoscopy was performed without                            difficulty. The patient tolerated the procedure                            well. The quality of the bowel preparation was                            good. The ileocecal valve, appendiceal orifice, and                            rectum were photographed. Scope In: 9:53:52 AM Scope Out: 10:17:31 AM Scope Withdrawal Time: 0 hours 16 minutes 4 seconds  Total Procedure Duration: 0 hours 23 minutes 39 seconds  Findings:                 A 20 mm polyp was found in the proximal ascending                            colon. The polyp was sessile and granular lateral                            spreading, best visualized on the retroflexed                            examination. The polyp was removed with a piecemeal                            technique using a cold snare in front view and                            retroflexed examination. Resection and retrieval                            were complete. Estimated blood  loss was minimal. No                            bleeding towards the end of the procedure.                           Multiple medium-mouthed diverticula were found in                            the sigmoid colon, few in descending colon and                            ascending colon.                           Non-bleeding internal hemorrhoids were found during                            retroflexion. The hemorrhoids were small and Grade                            I (internal hemorrhoids that do not prolapse).                           The exam was otherwise without abnormality on                            direct and retroflexion views. Complications:            No immediate complications. Estimated Blood Loss:     Estimated blood loss: none. Impression:               - One 20 mm polyp in the proximal ascending colon,                            removed piecemeal using a  cold snare (EMR).                            Resected and retrieved.                           - Pancolonic diverticulosis predominantly in the                            sigmoid colon.                           - Non-bleeding internal hemorrhoids.                           - The examination was otherwise normal on direct                            and retroflexion views. Recommendation:           - Patient has a contact number available for  emergencies. The signs and symptoms of potential                            delayed complications were discussed with the                            patient. Return to normal activities tomorrow.                            Written discharge instructions were provided to the                            patient. She does have higher than baseline                            incidence of bleeding given the size, location of                            the polyp.                           - Resume previous diet.                           - Continue present medications.                           - Await pathology results.                           - No aspirin, ibuprofen , naproxen , or other                            non-steroidal anti-inflammatory drugs for 5 days                            after polyp removal.                           - Repeat colonoscopy for surveillance based on                            pathology results.                           - The findings and recommendations were discussed                            with the patient's family. Lajuan Pila, MD 06/27/2023 10:24:49 AM This report has been signed electronically.

## 2023-06-27 NOTE — Progress Notes (Signed)
 [facility] History and Physical   Primary Care Physician:  Benjiman Bras, MD   Reason for Procedure:   H/O polyps 06/2020  Plan:    colon     HPI: Lauren Chapman is a 53 y.o. female    Past Medical History:  Diagnosis Date   Allergy    Anxiety    Bipolar affective psychosis (HCC)    Complication of anesthesia    " Acting silly- waving at everyone"-Giddy   Depression    Emphysema lung (HCC)    inhaler prn,  followed by pcp and pulmonologist-- dr Almyra Arn   Family history of adverse reaction to anesthesia    Father had rheumatic fever, and died on operating table.   GERD (gastroesophageal reflux disease)    Kidney cysts 01/24/2020   Right Kidney   Migraine    Moderate persistent asthma 09/05/2021   Sleep apnea    Smokers' cough (HCC)    per pt not productive   SUI (stress urinary incontinence, female)    Tubular adenoma of colon 07/08/2020   Wears glasses     Past Surgical History:  Procedure Laterality Date   ABDOMINAL HYSTERECTOMY N/A    Phreesia 11/30/2019   bladder stimulator     CARPAL TUNNEL RELEASE Right 09-12-2007   @[facility]    and GANGLION CYST EXCISION   COLONOSCOPY     CYSTOSCOPY N/A 04/29/2019   Procedure: CYSTOSCOPY;  Surgeon: Mallory Seaman, MD;  Location: [facility];  Service: Gynecology;  Laterality: N/A;   DILATATION & CURETTAGE/HYSTEROSCOPY WITH MYOSURE N/A 11/13/2018   Procedure: DILATATION & CURETTAGE/HYSTEROSCOPY WITH MYOSURE;  Surgeon: Lacretia Piccolo, MD;  Location: [facility];  Service: Gynecology;  Laterality: N/A;  request 9:00am OR start time in [place] [facility] block requests one hour   DILATION AND CURETTAGE OF UTERUS  02/2019   EYE SURGERY N/A    Phreesia 11/30/2019   GANGLION CYST EXCISION     R wrist   GREAT TOE ARTHRODESIS, JONES PROCEDURE     hammer toe 2024   interstem      implant for overactive bladder   KNEE ARTHROSCOPY WITH ANTERIOR CRUCIATE LIGAMENT (ACL) REPAIR Right  10/2015   LAPAROSCOPIC CHOLECYSTECTOMY  1997   LAPAROSCOPIC HYSTERECTOMY Bilateral 04/29/2019   Procedure: HYSTERECTOMY TOTAL LAPAROSCOPIC, BILATERAL SALPINO-OOPHORECTOMY;  Surgeon: Mallory Seaman, MD;  Location[facility];  Service: Gynecology;  Laterality: Bilateral;   LAPAROSCOPIC TOTAL HYSTERECTOMY     TOOTH EXTRACTION  04/01/2019   tooth removal Bilateral 2022   4 teeth removed   UPPER GASTROINTESTINAL ENDOSCOPY      Prior to Admission medications   Medication Sig Start Date End Date Taking? Authorizing Provider  budesonide -formoterol  (SYMBICORT ) 160-4.5 MCG/ACT inhaler Inhale 2 puffs into the lungs 2 (two) times daily. 07/18/22  Yes Cobb, Mariah Shines, NP  busPIRone  (BUSPAR ) 15 MG tablet Take 1 tablet (15 mg total) by mouth 3 (three) times daily. 03/09/23  Yes Levester Reagin, NP  DEPAKOTE 125 MG DR tablet Take 125 mg by mouth 3 (three) times daily. 06/13/23  Yes [provider]  esomeprazole  (NEXIUM ) 20 MG capsule Take 1 capsule (20 mg total) by mouth 2 (two) times daily before a meal. Patient taking differently: Take 20 mg by mouth daily at 12 noon. 03/21/19  Yes Elyce Hams, Marguerita Shih, MD  gabapentin  (NEURONTIN ) 400 MG capsule Take 2 capsules (800 mg total) by mouth 3 (three) times daily. 04/02/23  Yes Augusta Blizzard, MD  metoprolol  tartrate (LOPRESSOR) 25 MG tablet Take 25 mg by mouth 2 (two) times daily.   Yes [provider]  MYRBETRIQ  50 MG TB24 tablet Take 50 mg by mouth daily. 11/11/21  Yes [provider]  thiothixene (NAVANE) 10 MG capsule Take 20 mg by mouth at bedtime. 04/06/23  Yes [provider]  albuterol  (VENTOLIN  HFA) 108 (90 Base) MCG/ACT inhaler Inhale 2 puffs into the lungs every 4 (four) hours as needed for wheezing or shortness of breath. 07/18/22   Cobb, Mariah Shines, NP  citalopram  (CELEXA ) 40 MG tablet Take 1 tablet (40 mg total) by mouth daily. 04/16/23 06/21/23  Augusta Blizzard, MD  EPINEPHrine  0.3 mg/0.3 mL IJ SOAJ  injection Inject 0.3 mg into the muscle as needed for anaphylaxis. Patient not taking: Reported on 06/21/2023 03/29/21   Adel Aden, PA-C  ondansetron  (ZOFRAN ) 4 MG tablet Take 1 tablet (4 mg total) by mouth every 8 (eight) hours as needed for nausea or vomiting. 01/05/20   Wess Hammed, NP  Rimegepant Sulfate (NURTEC) 75 MG TBDP Take 1 tablet (75 mg total) by mouth daily as needed. 09/18/22   Merriam Abbey, DO  SUMAtriptan  (IMITREX ) 6 MG/0.5ML SOLN injection Inject 0.5 mLs (6 mg total) into the skin See admin instructions. INJECT 6 MG (0.5 ml) INTO THE SKIN AS NEEDED FOR MIGRAINE OR HEADACHE. MAY REPEAT ONCE IN 2 HOURS IF HEADACHE PERSISTS OR RECURS. MAX 2 DOSES IN 24 HOURS. 02/17/22   Festus Hubert, Adam R, DO  tiZANidine  (ZANAFLEX ) 4 MG tablet Take 1 tablet (4 mg total) by mouth every 6 (six) hours as needed for muscle spasms. 04/19/23   Ether Hercules, MD    Current Outpatient Medications  Medication Sig Dispense Refill   budesonide -formoterol  (SYMBICORT ) 160-4.5 MCG/ACT inhaler Inhale 2 puffs into the lungs 2 (two) times daily. 1 each 6   busPIRone  (BUSPAR ) 15 MG tablet Take 1 tablet (15 mg total) by mouth 3 (three) times daily. 30 tablet 0   DEPAKOTE 125 MG DR tablet Take 125 mg by mouth 3 (three) times daily.     esomeprazole  (NEXIUM ) 20 MG capsule Take 1 capsule (20 mg total) by mouth 2 (two) times daily before a meal. (Patient taking differently: Take 20 mg by mouth daily at 12 noon.) 60 capsule 1   gabapentin  (NEURONTIN ) 400 MG capsule Take 2 capsules (800 mg total) by mouth 3 (three) times daily.     metoprolol tartrate (LOPRESSOR) 25 MG tablet Take 25 mg by mouth 2 (two) times daily.     MYRBETRIQ  50 MG TB24 tablet Take 50 mg by mouth daily.     thiothixene (NAVANE) 10 MG capsule Take 20 mg by mouth at bedtime.     albuterol  (VENTOLIN  HFA) 108 (90 Base) MCG/ACT inhaler Inhale 2 puffs into the lungs every 4 (four) hours as needed for wheezing or shortness of breath. 1 each 1    citalopram  (CELEXA ) 40 MG tablet Take 1 tablet (40 mg total) by mouth daily. 30 tablet 0   EPINEPHrine  0.3 mg/0.3 mL IJ SOAJ injection Inject 0.3 mg into the muscle as needed for anaphylaxis. (Patient not taking: Reported on 06/21/2023) 1 each 0   ondansetron  (ZOFRAN ) 4 MG tablet Take 1 tablet (4 mg total) by mouth every 8 (eight) hours as needed for nausea or vomiting. 20 tablet 6   Rimegepant Sulfate (NURTEC) 75 MG TBDP Take 1 tablet (75 mg total) by mouth daily as needed. 8 tablet 11   SUMAtriptan  (IMITREX ) 6  MG/0.5ML SOLN injection Inject 0.5 mLs (6 mg total) into the skin See admin instructions. INJECT 6 MG (0.5 ml) INTO THE SKIN AS NEEDED FOR MIGRAINE OR HEADACHE. MAY REPEAT ONCE IN 2 HOURS IF HEADACHE PERSISTS OR RECURS. MAX 2 DOSES IN 24 HOURS. 3 mL 5   tiZANidine  (ZANAFLEX ) 4 MG tablet Take 1 tablet (4 mg total) by mouth every 6 (six) hours as needed for muscle spasms. 30 tablet 5   Current Facility-Administered Medications  Medication Dose Route Frequency Provider Last Rate Last Admin   0.9 %  sodium chloride  infusion  500 mL Intravenous Once Lajuan Pila, MD        Allergies as of 06/27/2023 - Review Complete 06/27/2023  Allergen Reaction Noted   Codeine Anaphylaxis and Other (See Comments) 10/11/2011    Family History  Problem Relation Age of Onset   Depression Mother    Cancer Mother        Cervical   Alcohol abuse Father    Crohn's disease Sister    Stroke Brother 67   Migraines Daughter    GER disease Daughter    Depression Daughter    Bipolar disorder Daughter    Migraines Daughter    GER disease Daughter    Colon cancer Neg Hx    Rectal cancer Neg Hx    Stomach cancer Neg Hx     Social History   Socioeconomic History   Marital status: Divorced    Spouse name: n/a   Number of children: 2   Years of education: 18   Highest education level: Master's degree (e.g., MA, MS, MEng, MEd, MSW, MBA)  Occupational History   Occupation: Disabled    Comment: Formerly  a Runner, broadcasting/film/video.  Tobacco Use   Smoking status: Former    Current packs/day: 0.00    Average packs/day: 1 pack/day for 39.8 years (39.8 ttl pk-yrs)    Types: Cigarettes    Start date: 10/10/1981    Quit date: 07/18/2021    Years since quitting: 1.9    Passive exposure: Never   Smokeless tobacco: Never  Vaping Use   Vaping status: Never Used  Substance and Sexual Activity   Alcohol use: Never    Alcohol/week: 0.0 standard drinks of alcohol   Drug use: Never   Sexual activity: Not Currently    Partners: Male    Birth control/protection: Post-menopausal    Comment: menarche 53yo, 1st intercourse 53 yo-More than 5 partners  Other Topics Concern   Not on file  Social History Narrative   Lives at home with 1 of her two daughters   Right-handed.   2-4 cups caffeine daily.   Disabled    One story home   Social Drivers of Health   Financial Resource Strain: Medium Risk (04/18/2023)   Overall Financial Resource Strain (CARDIA)    Difficulty of Paying Living Expenses: Somewhat hard  Food Insecurity: Food Insecurity Present (04/18/2023)   Hunger Vital Sign    Worried About Running Out of Food in the Last Year: Often true    Ran Out of Food in the Last Year: Sometimes true  Transportation Needs: No Transportation Needs (04/18/2023)   PRAPARE - Administrator, Civil Service (Medical): No    Lack of Transportation (Non-Medical): No  Physical Activity: Inactive (04/18/2023)   Exercise Vital Sign    Days of Exercise per Week: 0 days    Minutes of Exercise per Session: 0 min  Stress: Stress Concern Present (04/18/2023)   [place]  Institute of Occupational Health - Occupational Stress Questionnaire    Feeling of Stress : Very much  Social Connections: Socially Isolated (04/18/2023)   Social Connection and Isolation Panel [NHANES]    Frequency of Communication with Friends and Family: More than three times a week    Frequency of Social Gatherings with Friends and Family: Three times a  week    Attends Religious Services: Never    Active Member of Clubs or Organizations: No    Attends Banker Meetings: Never    Marital Status: Divorced  Catering manager Violence: Not At Risk (03/02/2023)   Humiliation, Afraid, Rape, and Kick questionnaire    Fear of Current or Ex-Partner: No    Emotionally Abused: No    Physically Abused: No    Sexually Abused: No    Review of Systems: Positive for none All other review of systems negative except as mentioned in the HPI.  Physical Exam: Vital signs in last 24 hours: @VSRANGES @   General:   Alert,  Well-developed, well-nourished, pleasant and cooperative in NAD Lungs:  Clear throughout to auscultation.   Heart:  Regular rate and rhythm; no murmurs, clicks, rubs,  or gallops. Abdomen:  Soft, nontender and nondistended. Normal bowel sounds.   Neuro/Psych:  Alert and cooperative. Normal mood and affect. A and O x 3    No significant changes were identified.  The patient continues to be an appropriate candidate for the planned procedure and anesthesia.   Magnus Schuller, MD. [facility] 06/27/2023 9:48 AM@

## 2023-06-27 NOTE — Patient Instructions (Signed)
 Educational handout provided to patient related to Hemorrhoids, Polyps, and Diverticulosis  Resume previous diet  Continue present medications- NO ASPIRIN, IBUPROFEN, NAPROXEN, OR OTHER NON-STEROIDAL ANTI-INFLAMMATORY DRUGS FOR 5 DAYS AFTER POLYP REMOVAL  Awaiting pathology results  YOU HAD AN ENDOSCOPIC PROCEDURE TODAY AT THE [facility]:   Refer to the procedure report that was given to you for any specific questions about what was found during the examination.  If the procedure report does not answer your questions, please call your gastroenterologist to clarify.  If you requested that your care partner not be given the details of your procedure findings, then the procedure report has been included in a sealed envelope for you to review at your convenience later.  YOU SHOULD EXPECT: Some feelings of bloating in the abdomen. Passage of more gas than usual.  Walking can help get rid of the air that was put into your GI tract during the procedure and reduce the bloating. If you had a lower endoscopy (such as a colonoscopy or flexible sigmoidoscopy) you may notice spotting of blood in your stool or on the toilet paper. If you underwent a bowel prep for your procedure, you may not have a normal bowel movement for a few days.  Please Note:  You might notice some irritation and congestion in your nose or some drainage.  This is from the oxygen used during your procedure.  There is no need for concern and it should clear up in a day or so.  SYMPTOMS TO REPORT IMMEDIATELY:  Following lower endoscopy (colonoscopy or flexible sigmoidoscopy):  Excessive amounts of blood in the stool  Significant tenderness or worsening of abdominal pains  Swelling of the abdomen that is new, acute  Fever of 100F or higher  For urgent or emergent issues, a gastroenterologist can be reached at any hour by calling [phone]. Do not use MyChart messaging for urgent concerns.    DIET:  We do recommend  a small meal at first, but then you may proceed to your regular diet.  Drink plenty of fluids but you should avoid alcoholic beverages for 24 hours.  ACTIVITY:  You should plan to take it easy for the rest of today and you should NOT DRIVE or use heavy machinery until tomorrow (because of the sedation medicines used during the test).    FOLLOW UP: Our staff will call the number listed on your records the next business day following your procedure.  We will call around 7:15- 8:00 am to check on you and address any questions or concerns that you may have regarding the information given to you following your procedure. If we do not reach you, we will leave a message.     If any biopsies were taken you will be contacted by phone or by letter within the next 1-3 weeks.  Please call us  at [phone] if you have not heard about the biopsies in 3 weeks.    SIGNATURES/CONFIDENTIALITY: You and/or your care partner have signed paperwork which will be entered into your electronic medical record.  These signatures attest to the fact that that the information above on your After Visit Summary has been reviewed and is understood.  Full responsibility of the confidentiality of this discharge information lies with you and/or your care-partner.

## 2023-06-27 NOTE — Progress Notes (Signed)
 Pt's states no medical or surgical changes since previsit or office visit.

## 2023-06-27 NOTE — Progress Notes (Signed)
 Called to room to assist during endoscopic procedure.  Patient ID and intended procedure confirmed with present staff. Received instructions for my participation in the procedure from the performing physician.

## 2023-06-28 ENCOUNTER — Telehealth: Payer: Self-pay

## 2023-06-28 ENCOUNTER — Ambulatory Visit

## 2023-06-28 VITALS — BP 119/77 | HR 62 | Temp 97.8°F | Resp 12 | Ht 63.0 in | Wt 167.0 lb

## 2023-06-28 DIAGNOSIS — G43709 Chronic migraine without aura, not intractable, without status migrainosus: Secondary | ICD-10-CM

## 2023-06-28 MED ORDER — SODIUM CHLORIDE 0.9 % IV SOLN
300.0000 mg | Freq: Once | INTRAVENOUS | Status: AC
  Administered 2023-06-28: 300 mg via INTRAVENOUS
  Filled 2023-06-28: qty 3

## 2023-06-28 NOTE — Progress Notes (Signed)
 Diagnosis: Chronic migraine  Provider:  Mannam, Praveen MD  Procedure: IV Infusion  IV Type: Peripheral, IV Location: L Antecubital  Vyepti  (Eptinezumab -jjmr), Dose: 300 mg  Infusion Start Time: 1425  Infusion Stop Time: 1456  Post Infusion IV Care: Peripheral IV Discontinued  Discharge: Condition: Stable, Destination: Home . AVS Declined  Performed by:  Lendel Quant, RN

## 2023-06-28 NOTE — Telephone Encounter (Signed)
 Unable to reach pt.  Left HIPAA compliant voicemail.

## 2023-06-29 LAB — SURGICAL PATHOLOGY

## 2023-07-03 ENCOUNTER — Ambulatory Visit: Payer: Self-pay | Admitting: Gastroenterology

## 2023-07-12 ENCOUNTER — Other Ambulatory Visit: Payer: Self-pay | Admitting: Nurse Practitioner

## 2023-07-12 DIAGNOSIS — J4540 Moderate persistent asthma, uncomplicated: Secondary | ICD-10-CM

## 2023-07-12 NOTE — Telephone Encounter (Signed)
 Copied from CRM [id number]. Topic: Clinical - Medication Refill >> Jul 12, 2023  1:02 PM Juliana Ocean wrote: Medication: budesonide -formoterol  (SYMBICORT ) 160-4.5 MCG/ACT inhaler  Has the patient contacted their pharmacy? Yes Thye advised to call her dr  This is the patient's preferred pharmacy:  [facility] [place], [place] - [address] [place] [address] Phone: [phone] Fax: [phone]  Is this the correct pharmacy for this prescription? Yes If no, delete pharmacy and type the correct one.   Has the prescription been filled recently? No  Is the patient out of the medication? Yes  Has the patient been seen for an appointment in the last year OR does the patient have an upcoming appointment? Yes  Can we respond through MyChart? Yes  Pt made appt.  First available is Sept  4th. Not seen since video visit 07/17/2022

## 2023-08-30 ENCOUNTER — Ambulatory Visit (INDEPENDENT_AMBULATORY_CARE_PROVIDER_SITE_OTHER): Payer: 59 | Admitting: Family Medicine

## 2023-08-30 ENCOUNTER — Encounter: Payer: Self-pay | Admitting: Family Medicine

## 2023-08-30 VITALS — BP 98/68 | HR 56 | Temp 98.0°F | Resp 12 | Ht 63.0 in | Wt 161.8 lb

## 2023-08-30 DIAGNOSIS — I959 Hypotension, unspecified: Secondary | ICD-10-CM | POA: Diagnosis not present

## 2023-08-30 DIAGNOSIS — E7800 Pure hypercholesterolemia, unspecified: Secondary | ICD-10-CM | POA: Diagnosis not present

## 2023-08-30 DIAGNOSIS — R7303 Prediabetes: Secondary | ICD-10-CM | POA: Diagnosis not present

## 2023-08-30 DIAGNOSIS — M79604 Pain in right leg: Secondary | ICD-10-CM

## 2023-08-30 LAB — HEMOGLOBIN A1C: Hgb A1c MFr Bld: 5.3 % (ref 4.6–6.5)

## 2023-08-30 NOTE — Progress Notes (Signed)
 Subjective:  Patient ID: Lauren Chapman, female    DOB: [birth year]  Age: 53 y.o. MRN: [id number]  CC:  Chief Complaint  Patient presents with   Follow-up    Medication check and labs. Patient stated she has tried to call [facility] and no one will answer the phone to schedule appt.    HPI Lauren Chapman presents for   Right leg pain See prior visit, last discussed May 28.  Symptoms present for some time.  Various areas of discomfort without known injury.  Possible lumbar source discussed previously.  Right FABER and hip imaging with mild bilateral hip arthritic changes but no fracture dislocation or concerns on right knee or right tib-fib imaging.  Lumbar spine with mild degenerative changes but no acute findings.  Stimulator device was noted entering her sacral region which is used for overactive bladder.  Psychiatric side effects with prednisone  prior, held on repeat use.  No change in symptoms with muscle relaxant, NSAID, tramadol .  Still possible lumbar source, and possible component of arthritic change for meniscal cause of knee pain.  Referred to orthopedics at her May visit.  As above, states that she has tried calling [facility] to schedule appointment but difficulty with having somebody answer. Did not recive calls back.  Referral notes reviewed, appears there was 3 separate calls to patient but then after those unsuccessful attempts referral was closed in June.  Still same symptoms - no new pain, still different areas at times, not needing assistive device.   Prediabetes: Diet/exercise approach, due for repeat labs.  Prior elevated LDL as well with diet/exercise approach.  Weight has improved, down from 196 pounds in February.  161 pounds today. Cut out junk food. Fasting today.  Trig 156, LDL 106 in 02/15/23 labs.  A1c 5.9 on 02/05/23.   Lab Results  Component Value Date   CHOL 153 11/07/2021   HDL 52.50 11/07/2021   LDLCALC 73 11/07/2021   TRIG 134.0 11/07/2021    CHOLHDL 3 11/07/2021    No regular exercise.  Lab Results  Component Value Date   HGBA1C 5.8 05/10/2022   Wt Readings from Last 3 Encounters:  08/30/23 161 lb 12.8 oz (73.4 kg)  06/28/23 167 lb (75.8 kg)  06/27/23 174 lb (78.9 kg)    Positive PHQ.  Under care of psychiatry. Denies active suicidal ideation at this time, psychiatry aware of her prior symptoms. No intent or plan. Undergoing TMS treatments. Going ok.    Low blood pressure: Noted on initial vitals, repeat BP right arm by myself 98/68.  She is taking 25 mg metoprolol twice per day for anxiety, under the care of psychiatry, no other antihypertensives.  Only occasional lightheadedness, no syncope/near-syncope.     History Patient Active Problem List   Diagnosis Date Noted   Morbid obesity (HCC) 05/22/2023   Schizoaffective disorder (HCC) 03/01/2023   Asthma 02/15/2023   Intractable chronic migraine with aura with status migrainosus 02/15/2023   Overactive bladder 02/15/2023   Poorly controlled mild persistent asthma 09/05/2021   Syncope 08/02/2021   CAP (community acquired pneumonia) 07/18/2021   OSA (obstructive sleep apnea) 07/18/2021   Hypokalemia 07/18/2021   Depression 03/24/2020   Left lumbar radiculopathy 02/25/2020   Absence of bladder continence 02/25/2020   History of postmenopausal bleeding 04/29/2019   Polypharmacy 04/03/2019   Acute low back pain 07/31/2018   Emphysema, unspecified (HCC) 05/17/2018   Weakness 01/25/2017   Chronic migraine w/o aura w/o status migrainosus, not intractable  12/22/2014   Encounter for other general counseling or advice on contraception 09/02/2014   Bipolar disorder (HCC) 10/12/2011   Anxiety 10/12/2011   GERD (gastroesophageal reflux disease) 10/12/2011   Past Medical History:  Diagnosis Date   Allergy    Anxiety    Bipolar affective psychosis (HCC)    Complication of anesthesia     Acting silly- waving at everyone-Giddy   Depression    Emphysema lung (HCC)     inhaler prn,  followed by pcp and pulmonologist-- dr frances   Family history of adverse reaction to anesthesia    Father had rheumatic fever, and died on operating table.   GERD (gastroesophageal reflux disease)    Kidney cysts 01/24/2020   Right Kidney   Migraine    Moderate persistent asthma 09/05/2021   Sleep apnea    Smokers' cough (HCC)    per pt not productive   SUI (stress urinary incontinence, female)    Tubular adenoma of colon 07/08/2020   Wears glasses    Past Surgical History:  Procedure Laterality Date   ABDOMINAL HYSTERECTOMY N/A    Phreesia 11/30/2019   bladder stimulator     CARPAL TUNNEL RELEASE Right 09-12-2007   @[facility]    and GANGLION CYST EXCISION   COLONOSCOPY     CYSTOSCOPY N/A 04/29/2019   Procedure: CYSTOSCOPY;  Surgeon: Arnaldo Purchase, MD;  Location: [facility];  Service: Gynecology;  Laterality: N/A;   DILATATION & CURETTAGE/HYSTEROSCOPY WITH MYOSURE N/A 11/13/2018   Procedure: DILATATION & CURETTAGE/HYSTEROSCOPY WITH MYOSURE;  Surgeon: Rockney Evalene SQUIBB, MD;  Location: [facility];  Service: Gynecology;  Laterality: N/A;  request 9:00am OR start time in [place] Gyn block requests one hour   DILATION AND CURETTAGE OF UTERUS  02/2019   EYE SURGERY N/A    Phreesia 11/30/2019   GANGLION CYST EXCISION     R wrist   GREAT TOE ARTHRODESIS, JONES PROCEDURE     hammer toe 2024   interstem      implant for overactive bladder   KNEE ARTHROSCOPY WITH ANTERIOR CRUCIATE LIGAMENT (ACL) REPAIR Right 10/2015   LAPAROSCOPIC CHOLECYSTECTOMY  1997   LAPAROSCOPIC HYSTERECTOMY Bilateral 04/29/2019   Procedure: HYSTERECTOMY TOTAL LAPAROSCOPIC, BILATERAL SALPINO-OOPHORECTOMY;  Surgeon: Arnaldo Purchase, MD;  Location: [facility] [place];  Service: Gynecology;  Laterality: Bilateral;   LAPAROSCOPIC TOTAL HYSTERECTOMY     TOOTH EXTRACTION  04/01/2019   tooth removal Bilateral 2022   4 teeth removed   UPPER  GASTROINTESTINAL ENDOSCOPY     Allergies  Allergen Reactions   Codeine Anaphylaxis and Other (See Comments)    Cannot have ANYTHING with codeine   Prior to Admission medications   Medication Sig Start Date End Date Taking? Authorizing Provider  albuterol  (VENTOLIN  HFA) 108 (90 Base) MCG/ACT inhaler Inhale 2 puffs into the lungs every 4 (four) hours as needed for wheezing or shortness of breath. 07/18/22  Yes Cobb, Comer GAILS, NP  budesonide -formoterol  (SYMBICORT ) 160-4.5 MCG/ACT inhaler INHALE 2 PUFFS INTO THE LUNGS 2 TIMES DAILY. 07/12/23  Yes Cobb, Comer GAILS, NP  busPIRone  (BUSPAR ) 15 MG tablet Take 1 tablet (15 mg total) by mouth 3 (three) times daily. 03/09/23  Yes Ezzard Staci SAILOR, NP  citalopram  (CELEXA ) 40 MG tablet Take 1 tablet (40 mg total) by mouth daily. 04/16/23 08/30/23 Yes Lynnette Barter, MD  EPINEPHrine  0.3 mg/0.3 mL IJ SOAJ injection Inject 0.3 mg into the muscle as needed for anaphylaxis. 03/29/21  Yes Ruthell Lonni FALCON, PA-C  esomeprazole  (NEXIUM )  20 MG capsule Take 1 capsule (20 mg total) by mouth 2 (two) times daily before a meal. Patient taking differently: Take 20 mg by mouth daily at 12 noon. 03/21/19  Yes Melonie Colonel, Mikel HERO, MD  gabapentin  (NEURONTIN ) 400 MG capsule Take 2 capsules (800 mg total) by mouth 3 (three) times daily. 04/02/23  Yes Lynnette Barter, MD  metoprolol tartrate (LOPRESSOR) 25 MG tablet Take 25 mg by mouth 2 (two) times daily.   Yes [provider]  MYRBETRIQ  50 MG TB24 tablet Take 50 mg by mouth daily. 11/11/21  Yes [provider]  ondansetron  (ZOFRAN ) 4 MG tablet Take 1 tablet (4 mg total) by mouth every 8 (eight) hours as needed for nausea or vomiting. 01/05/20  Yes Gayland Lauraine PARAS, NP  ramelteon (ROZEREM) 8 MG tablet Take 8 mg by mouth at bedtime. 08/14/23  Yes [provider]  Rimegepant Sulfate (NURTEC) 75 MG TBDP Take 1 tablet (75 mg total) by mouth daily as needed. 09/18/22  Yes Jaffe, Adam R, DO  SUMAtriptan  (IMITREX ) 6  MG/0.5ML SOLN injection Inject 0.5 mLs (6 mg total) into the skin See admin instructions. INJECT 6 MG (0.5 ml) INTO THE SKIN AS NEEDED FOR MIGRAINE OR HEADACHE. MAY REPEAT ONCE IN 2 HOURS IF HEADACHE PERSISTS OR RECURS. MAX 2 DOSES IN 24 HOURS. 02/17/22  Yes Skeet, Adam R, DO  thiothixene (NAVANE) 10 MG capsule Take 20 mg by mouth at bedtime. 04/06/23  Yes [provider]  tiZANidine  (ZANAFLEX ) 4 MG tablet Take 1 tablet (4 mg total) by mouth every 6 (six) hours as needed for muscle spasms. 04/19/23  Yes Jerrell Cleatus Ned, MD  DEPAKOTE 125 MG DR tablet Take 125 mg by mouth 3 (three) times daily. 06/13/23   [provider]   Social History   Socioeconomic History   Marital status: Divorced    Spouse name: n/a   Number of children: 2   Years of education: 18   Highest education level: Master's degree (e.g., MA, MS, MEng, MEd, MSW, MBA)  Occupational History   Occupation: Disabled    Comment: Formerly a Runner, broadcasting/film/video.  Tobacco Use   Smoking status: Former    Current packs/day: 0.00    Average packs/day: 1 pack/day for 39.8 years (39.8 ttl pk-yrs)    Types: Cigarettes    Start date: 10/10/1981    Quit date: 07/18/2021    Years since quitting: 2.1    Passive exposure: Never   Smokeless tobacco: Never  Vaping Use   Vaping status: Never Used  Substance and Sexual Activity   Alcohol use: Never    Alcohol/week: 0.0 standard drinks of alcohol   Drug use: Never   Sexual activity: Not Currently    Partners: Male    Birth control/protection: Post-menopausal    Comment: menarche 53yo, 1st intercourse 53 yo-More than 5 partners  Other Topics Concern   Not on file  Social History Narrative   Lives at home with 1 of her two daughters   Right-handed.   2-4 cups caffeine daily.   Disabled    One story home   Social Drivers of Health   Financial Resource Strain: Low Risk  (08/29/2023)   Overall Financial Resource Strain (CARDIA)    Difficulty of Paying Living Expenses: Not very  hard  Food Insecurity: Food Insecurity Present (08/29/2023)   Hunger Vital Sign    Worried About Running Out of Food in the Last Year: Sometimes true    Ran Out of Food in  the Last Year: Sometimes true  Transportation Needs: No Transportation Needs (08/29/2023)   PRAPARE - Administrator, Civil Service (Medical): No    Lack of Transportation (Non-Medical): No  Physical Activity: Inactive (08/29/2023)   Exercise Vital Sign    Days of Exercise per Week: 0 days    Minutes of Exercise per Session: Not on file  Stress: Stress Concern Present (08/29/2023)   [employer] of Occupational Health - Occupational Stress Questionnaire    Feeling of Stress: Very much  Social Connections: Socially Isolated (08/29/2023)   Social Connection and Isolation Panel    Frequency of Communication with Friends and Family: More than three times a week    Frequency of Social Gatherings with Friends and Family: Once a week    Attends Religious Services: Never    Database administrator or Organizations: No    Attends Engineer, structural: Not on file    Marital Status: Divorced  Intimate Partner Violence: Not At Risk (03/02/2023)   Humiliation, Afraid, Rape, and Kick questionnaire    Fear of Current or Ex-Partner: No    Emotionally Abused: No    Physically Abused: No    Sexually Abused: No    Review of Systems  Per HPI.  Objective:   Vitals:   08/30/23 0836 08/30/23 0942  BP: 96/62 98/68  Pulse: (!) 56   Resp: 12   Temp: 98 F (36.7 C)   SpO2: 97%   Weight: 161 lb 12.8 oz (73.4 kg)   Height: 5' 3 (1.6 m)      Physical Exam Vitals reviewed.  Constitutional:      Appearance: Normal appearance. She is well-developed.  HENT:     Head: Normocephalic and atraumatic.  Eyes:     Conjunctiva/sclera: Conjunctivae normal.     Pupils: Pupils are equal, round, and reactive to light.  Neck:     Vascular: No carotid bruit.  Cardiovascular:     Rate and Rhythm: Normal rate and regular  rhythm.     Heart sounds: Normal heart sounds.  Pulmonary:     Effort: Pulmonary effort is normal.     Breath sounds: Normal breath sounds.  Abdominal:     Palpations: Abdomen is soft. There is no pulsatile mass.     Tenderness: There is no abdominal tenderness.  Musculoskeletal:     Right lower leg: No edema.     Left lower leg: No edema.  Skin:    General: Skin is warm and dry.  Neurological:     Mental Status: She is alert and oriented to person, place, and time.  Psychiatric:        Mood and Affect: Mood normal.        Behavior: Behavior normal.        Assessment & Plan:  Lauren Chapman is a 53 y.o. female . Prediabetes - Plan: Comprehensive metabolic panel with GFR, Hemoglobin A1c  - Anticipate improved readings with weight loss, commended on her efforts.  Check labs above and adjust plan accordingly.  Elevated LDL cholesterol level - Plan: Comprehensive metabolic panel with GFR, Lipid panel  - As above weight has improved and anticipate LDL, triglycerides will also look better on updated labs.  No new meds for now.  Hypotension, unspecified hypotension type  - She is on beta-blocker for anxiety, infrequent lightheadedness.  Asymptomatic at present.  Recommend she discuss use of metoprolol with her psychiatrist given lower blood pressures recently.  RTC/ER precautions given.  PHQ noted including history of suicidal ideation that is well-known to psychiatry and denies intent, plan or active suicidal ideation at this time.  Continue close follow-up with psychiatry.  Right leg pain No changes since last visit.  Difficulty with coordinating appointment with orthopedics.  I did call the referral coordinator during our office visit with patient, left a message with patient contact info and to advise me if a new referral needs to be placed.  Advised patient to let me know if she is unable to reach them within the next 2 weeks to schedule that appointment and I can help  further.  No orders of the defined types were placed in this encounter.  Patient Instructions  Thanks for coming today.  Hopefully the call to the orthopedic referral coordinator during our office visit will help facilitate an appointment with orthopedics.  Let me know if you are unable to schedule an appointment within the next 2 weeks.  Happy to help further if needed.  I have checked repeat labs for prediabetes and cholesterol levels but I suspect this will look much better with your weight loss, great work!  Blood pressure was on the low side here in the office.  Since you are having lightheadedness on occasion, that could be due to low blood pressure and would recommend talking to your psychiatrist about the metoprolol as that may be dropping your blood pressure too low.  Make sure to stay hydrated, be seen if any new or worsening symptoms.  Let me know if there are any further questions and hang in there.     Signed,   Reyes Pines, MD [facility], [facility] 08/30/23 9:46 AM

## 2023-08-30 NOTE — Patient Instructions (Addendum)
 Thanks for coming today.  Hopefully the call to the orthopedic referral coordinator during our office visit will help facilitate an appointment with orthopedics.  Let me know if you are unable to schedule an appointment within the next 2 weeks.  Happy to help further if needed.  I have checked repeat labs for prediabetes and cholesterol levels but I suspect this will look much better with your weight loss, great work!  Blood pressure was on the low side here in the office.  Since you are having lightheadedness on occasion, that could be due to low blood pressure and would recommend talking to your psychiatrist about the metoprolol as that may be dropping your blood pressure too low.  Make sure to stay hydrated, be seen if any new or worsening symptoms.  Let me know if there are any further questions and hang in there.

## 2023-08-31 LAB — COMPREHENSIVE METABOLIC PANEL WITH GFR
ALT: 11 U/L (ref 0–35)
AST: 14 U/L (ref 0–37)
Albumin: 4.4 g/dL (ref 3.5–5.2)
Alkaline Phosphatase: 55 U/L (ref 39–117)
BUN: 16 mg/dL (ref 6–23)
CO2: 25 meq/L (ref 19–32)
Calcium: 9.5 mg/dL (ref 8.4–10.5)
Chloride: 98 meq/L (ref 96–112)
Creatinine, Ser: 1.08 mg/dL (ref 0.40–1.20)
GFR: 57.9 mL/min — ABNORMAL LOW (ref >60.00)
Glucose, Bld: 83 mg/dL (ref 70–99)
Potassium: 4.4 meq/L (ref 3.5–5.1)
Sodium: 141 meq/L (ref 135–145)
Total Bilirubin: 0.4 mg/dL (ref 0.2–1.2)
Total Protein: 6.9 g/dL (ref 6.0–8.3)

## 2023-08-31 LAB — LIPID PANEL
Cholesterol: 184 mg/dL (ref 0–200)
HDL: 46.3 mg/dL (ref >39.00)
LDL Cholesterol: 109 mg/dL — ABNORMAL HIGH (ref 0–99)
NonHDL: 137.64
Total CHOL/HDL Ratio: 4
Triglycerides: 145 mg/dL (ref 0.0–149.0)
VLDL: 29 mg/dL (ref 0.0–40.0)

## 2023-09-04 ENCOUNTER — Ambulatory Visit: Payer: Self-pay | Admitting: Family Medicine

## 2023-09-06 ENCOUNTER — Other Ambulatory Visit: Payer: Self-pay | Admitting: Nurse Practitioner

## 2023-09-06 DIAGNOSIS — J4540 Moderate persistent asthma, uncomplicated: Secondary | ICD-10-CM

## 2023-09-10 ENCOUNTER — Ambulatory Visit (HOSPITAL_BASED_OUTPATIENT_CLINIC_OR_DEPARTMENT_OTHER)
Admission: RE | Admit: 2023-09-10 | Discharge: 2023-09-10 | Disposition: A | Discharge status: Home / Self Care | Source: Ambulatory Visit | Attending: Acute Care | Admitting: Acute Care

## 2023-09-10 DIAGNOSIS — Z87891 Personal history of nicotine dependence: Secondary | ICD-10-CM | POA: Diagnosis present

## 2023-09-10 DIAGNOSIS — Z122 Encounter for screening for malignant neoplasm of respiratory organs: Secondary | ICD-10-CM | POA: Diagnosis present

## 2023-09-14 ENCOUNTER — Ambulatory Visit (INDEPENDENT_AMBULATORY_CARE_PROVIDER_SITE_OTHER): Admitting: Sports Medicine

## 2023-09-14 ENCOUNTER — Encounter: Payer: Self-pay | Admitting: Sports Medicine

## 2023-09-14 ENCOUNTER — Ambulatory Visit: Admitting: Orthopaedic Surgery

## 2023-09-14 ENCOUNTER — Other Ambulatory Visit: Payer: Self-pay

## 2023-09-14 DIAGNOSIS — M1611 Unilateral primary osteoarthritis, right hip: Secondary | ICD-10-CM

## 2023-09-14 MED ORDER — LIDOCAINE HCL 1 % IJ SOLN
4.0000 mL | INTRAMUSCULAR | Status: AC | PRN
Start: 2023-09-14 — End: 2023-09-14
  Administered 2023-09-14: 4 mL

## 2023-09-14 MED ORDER — METHYLPREDNISOLONE ACETATE 40 MG/ML IJ SUSP
80.0000 mg | INTRAMUSCULAR | Status: AC | PRN
Start: 2023-09-14 — End: 2023-09-14
  Administered 2023-09-14: 80 mg via INTRA_ARTICULAR

## 2023-09-14 NOTE — Progress Notes (Signed)
 Office Visit Note   Patient: Lauren Chapman           Date of Birth: [birth year]           MRN: [id number] Visit Date: 09/14/2023              Requested by: Levora Reyes SAUNDERS, MD [id number] [place],  [place] [address] PCP: Levora Reyes SAUNDERS, MD   Assessment & Plan: Visit Diagnoses:  1. Primary osteoarthritis of right hip     Plan: History of Present Illness TALAR FRALEY is a 53 year old female who presents with right leg pain.  She experiences constant aching pain in her right leg, affecting the groin, hip, knee, and calf, persisting for several months. The pain worsens when lying on her side and improves when lying flat on her back. Tizanidine  provides no relief, but Excedrin offers some relief.  There is no back pain, numbness, or tingling in the leg. She has a bladder stimulator in place. She has undergone ACL surgery in the past.  Physical Exam MUSCULOSKELETAL: Right knee without swelling, good flexibility, ligaments stable, and ACL graft stable. Right hip exhibits pain on rotation and flexion, no tenderness over the bursa, and right groin pain on resisted leg raise.  Results RADIOLOGY Hip X-ray: Mild osteoarthritis, not severely degenerative  Assessment and Plan Right hip osteoarthritis with referred right knee pain Chronic right hip pain with referred knee pain likely due to hip osteoarthritis. X-rays show mild osteoarthritis, not severe enough for hip replacement. Pain may affect gait, exacerbating knee discomfort. - Will refer to Dr. Burnetta for intra-articular steroid injection. - Ordered physical therapy to strengthen hip muscles at current facility. - Follow-up if symptoms do not improve.  Follow-Up Instructions: No follow-ups on file.   Orders:  Orders Placed This Encounter  Procedures   Ambulatory referral to Physical Therapy   No orders of the defined types were placed in this encounter.     Procedures: No procedures performed   Clinical  Data: No additional findings.   Subjective: Chief Complaint  Patient presents with   Right Hip - Pain   Right Knee - Pain   Right Leg - Pain    HPI  Review of Systems  Constitutional: Negative.   HENT: Negative.    Eyes: Negative.   Respiratory: Negative.    Cardiovascular: Negative.   Endocrine: Negative.   Musculoskeletal: Negative.   Neurological: Negative.   Hematological: Negative.   Psychiatric/Behavioral: Negative.    All other systems reviewed and are negative.    Objective: Vital Signs: LMP 05/02/2019 Comment: irregular cycles  Physical Exam Vitals and nursing note reviewed.  Constitutional:      Appearance: She is well-developed.  HENT:     Head: Atraumatic.     Nose: Nose normal.  Eyes:     Extraocular Movements: Extraocular movements intact.  Cardiovascular:     Pulses: Normal pulses.  Pulmonary:     Effort: Pulmonary effort is normal.  Abdominal:     Palpations: Abdomen is soft.  Musculoskeletal:     Cervical back: Neck supple.  Skin:    General: Skin is warm.     Capillary Refill: Capillary refill takes less than 2 seconds.  Neurological:     Mental Status: She is alert. Mental status is at baseline.  Psychiatric:        Behavior: Behavior normal.        Thought Content: Thought content normal.  Judgment: Judgment normal.     Ortho Exam  Specialty Comments:  No specialty comments available.  Imaging: No results found.   PMFS History: Patient Active Problem List   Diagnosis Date Noted   Primary osteoarthritis of right hip 09/14/2023   Morbid obesity (HCC) 05/22/2023   Schizoaffective disorder (HCC) 03/01/2023   Asthma 02/15/2023   Intractable chronic migraine with aura with status migrainosus 02/15/2023   Overactive bladder 02/15/2023   Poorly controlled mild persistent asthma 09/05/2021   Syncope 08/02/2021   CAP (community acquired pneumonia) 07/18/2021   OSA (obstructive sleep apnea) 07/18/2021   Hypokalemia  07/18/2021   Depression 03/24/2020   Left lumbar radiculopathy 02/25/2020   Absence of bladder continence 02/25/2020   History of postmenopausal bleeding 04/29/2019   Polypharmacy 04/03/2019   Acute low back pain 07/31/2018   Emphysema, unspecified (HCC) 05/17/2018   Weakness 01/25/2017   Chronic migraine w/o aura w/o status migrainosus, not intractable 12/22/2014   Encounter for other general counseling or advice on contraception 09/02/2014   Bipolar disorder (HCC) 10/12/2011   Anxiety 10/12/2011   GERD (gastroesophageal reflux disease) 10/12/2011   Past Medical History:  Diagnosis Date   Allergy    Anxiety    Bipolar affective psychosis (HCC)    Complication of anesthesia     Acting silly- waving at everyone-Giddy   Depression    Emphysema lung (HCC)    inhaler prn,  followed by pcp and pulmonologist-- dr frances   Family history of adverse reaction to anesthesia    Father had rheumatic fever, and died on operating table.   GERD (gastroesophageal reflux disease)    Kidney cysts 01/24/2020   Right Kidney   Migraine    Moderate persistent asthma 09/05/2021   Sleep apnea    Smokers' cough (HCC)    per pt not productive   SUI (stress urinary incontinence, female)    Tubular adenoma of colon 07/08/2020   Wears glasses     Family History  Problem Relation Age of Onset   Depression Mother    Cancer Mother        Cervical   Alcohol abuse Father    Crohn's disease Sister    Stroke Brother 68   Migraines Daughter    GER disease Daughter    Depression Daughter    Bipolar disorder Daughter    Migraines Daughter    GER disease Daughter    Colon cancer Neg Hx    Rectal cancer Neg Hx    Stomach cancer Neg Hx     Past Surgical History:  Procedure Laterality Date   ABDOMINAL HYSTERECTOMY N/A    Phreesia 11/30/2019   bladder stimulator     CARPAL TUNNEL RELEASE Right 09-12-2007   @[facility]    and GANGLION CYST EXCISION   COLONOSCOPY     CYSTOSCOPY N/A 04/29/2019    Procedure: CYSTOSCOPY;  Surgeon: Arnaldo Purchase, MD;  Location: [facility];  Service: Gynecology;  Laterality: N/A;   DILATATION & CURETTAGE/HYSTEROSCOPY WITH MYOSURE N/A 11/13/2018   Procedure: DILATATION & CURETTAGE/HYSTEROSCOPY WITH MYOSURE;  Surgeon: Rockney Evalene SQUIBB, MD;  Location: [facility];  Service: Gynecology;  Laterality: N/A;  request 9:00am OR start time in [place] Gyn block requests one hour   DILATION AND CURETTAGE OF UTERUS  02/2019   EYE SURGERY N/A    Phreesia 11/30/2019   GANGLION CYST EXCISION     R wrist   GREAT TOE ARTHRODESIS, JONES PROCEDURE     hammer toe 2024  interstem      implant for overactive bladder   KNEE ARTHROSCOPY WITH ANTERIOR CRUCIATE LIGAMENT (ACL) REPAIR Right 10/2015   LAPAROSCOPIC CHOLECYSTECTOMY  1997   LAPAROSCOPIC HYSTERECTOMY Bilateral 04/29/2019   Procedure: HYSTERECTOMY TOTAL LAPAROSCOPIC, BILATERAL SALPINO-OOPHORECTOMY;  Surgeon: Arnaldo Purchase, MD;  Location: [facility] [place];  Service: Gynecology;  Laterality: Bilateral;   LAPAROSCOPIC TOTAL HYSTERECTOMY     TOOTH EXTRACTION  04/01/2019   tooth removal Bilateral 2022   4 teeth removed   UPPER GASTROINTESTINAL ENDOSCOPY     Social History   Occupational History   Occupation: Disabled    Comment: Formerly a Runner, broadcasting/film/video.  Tobacco Use   Smoking status: Former    Current packs/day: 0.00    Average packs/day: 1 pack/day for 39.8 years (39.8 ttl pk-yrs)    Types: Cigarettes    Start date: 10/10/1981    Quit date: 07/18/2021    Years since quitting: 2.1    Passive exposure: Never   Smokeless tobacco: Never  Vaping Use   Vaping status: Never Used  Substance and Sexual Activity   Alcohol use: Never    Alcohol/week: 0.0 standard drinks of alcohol   Drug use: Never   Sexual activity: Not Currently    Partners: Male    Birth control/protection: Post-menopausal    Comment: menarche 53yo, 1st intercourse 53 yo-More than 5 partners

## 2023-09-14 NOTE — Progress Notes (Signed)
   Procedure Note  Patient: Lauren Chapman             Date of Birth: 01/26/1970           MRN: [id number]             Visit Date: 09/14/2023  Procedures: Visit Diagnoses:  1. Primary osteoarthritis of right hip    Large Joint Inj: R hip joint on 09/14/2023 10:36 AM Indications: pain Details: 22 G 3.5 in needle, ultrasound-guided anterior approach Medications: 4 mL lidocaine  1 %; 80 mg methylPREDNISolone  acetate 40 MG/ML Outcome: tolerated well, no immediate complications  Procedure: US -guided intra-articular hip injection, Right After discussion on risks/benefits/indications and informed verbal consent was obtained, a timeout was performed. Patient was lying supine on exam table. The hip was cleaned with betadine and alcohol swabs. Then utilizing ultrasound guidance, the patient's femoral head and neck junction was identified and subsequently injected with 4:2 lidocaine :depomedrol via an in-plane approach with ultrasound visualization of the injectate administered into the hip joint. Patient tolerated procedure well without immediate complications.  Procedure, treatment alternatives, risks and benefits explained, specific risks discussed. Consent was given by the patient. Immediately prior to procedure a time out was called to verify the correct patient, procedure, equipment, support staff and site/side marked as required. Patient was prepped and draped in the usual sterile fashion.     - patient tolerated procedure well, discussed post-injection protocol - follow-up with Dr. Jerri as indicated; I am happy to see them as needed  Lonell Sprang, DO Primary Care Sports Medicine Physician  [facility]  This note was dictated using Dragon naturally speaking software and may contain errors in syntax, spelling, or content which have not been identified prior to signing this note.

## 2023-09-25 ENCOUNTER — Other Ambulatory Visit: Payer: Self-pay

## 2023-09-25 DIAGNOSIS — Z122 Encounter for screening for malignant neoplasm of respiratory organs: Secondary | ICD-10-CM

## 2023-09-25 DIAGNOSIS — Z87891 Personal history of nicotine dependence: Secondary | ICD-10-CM

## 2023-09-27 ENCOUNTER — Ambulatory Visit (INDEPENDENT_AMBULATORY_CARE_PROVIDER_SITE_OTHER): Admitting: Nurse Practitioner

## 2023-09-27 ENCOUNTER — Encounter: Payer: Self-pay | Admitting: Nurse Practitioner

## 2023-09-27 VITALS — BP 102/68 | HR 61 | Ht 63.0 in | Wt 162.0 lb

## 2023-09-27 DIAGNOSIS — J4540 Moderate persistent asthma, uncomplicated: Secondary | ICD-10-CM | POA: Diagnosis not present

## 2023-09-27 DIAGNOSIS — J438 Other emphysema: Secondary | ICD-10-CM | POA: Diagnosis not present

## 2023-09-27 DIAGNOSIS — Z87891 Personal history of nicotine dependence: Secondary | ICD-10-CM | POA: Diagnosis not present

## 2023-09-27 MED ORDER — BUDESONIDE-FORMOTEROL FUMARATE 160-4.5 MCG/ACT IN AERO: 2.0000 | INHALATION_SPRAY | Freq: Two times a day (BID) | RESPIRATORY_TRACT | 7 refills | Status: AC

## 2023-09-27 MED ORDER — BUDESONIDE-FORMOTEROL FUMARATE 160-4.5 MCG/ACT IN AERO: 2.0000 | INHALATION_SPRAY | Freq: Two times a day (BID) | RESPIRATORY_TRACT | 7 refills | Status: DC

## 2023-09-27 NOTE — Assessment & Plan Note (Signed)
 Quit 2023. Now vaping; cessation strongly encouraged. Continue annual lung cancer screening CTs.

## 2023-09-27 NOTE — Assessment & Plan Note (Signed)
 Mild persistent asthma. Attempted to de-escalate her ICS last year but had a mild flare as a result. Does well on high dose Symbicort . No recent exacerbations or hospitalizations for her breathing. Action plan in place. Trigger prevention reviewed.  Patient Instructions  Continue Albuterol  inhaler 2 puffs or 3 mL neb every 6 hours as needed for shortness of breath or wheezing. Notify if symptoms persist despite rescue inhaler/neb use. Continue nexium  20 mg Twice daily  Continue Symbicort  160 mcg 2 puffs Twice daily. Brush tongue and rinse mouth afterwards   Follow up in one year with Dr Neda or Izetta Malachy PIETY. If symptoms do not improve or worsen, please contact office for sooner follow up or seek emergency care.

## 2023-09-27 NOTE — Patient Instructions (Addendum)
 Continue Albuterol  inhaler 2 puffs or 3 mL neb every 6 hours as needed for shortness of breath or wheezing. Notify if symptoms persist despite rescue inhaler/neb use. Continue nexium  20 mg Twice daily  Continue Symbicort  160 mcg 2 puffs Twice daily. Brush tongue and rinse mouth afterwards   Follow up in one year with Dr Neda or Izetta Malachy PIETY. If symptoms do not improve or worsen, please contact office for sooner follow up or seek emergency care.

## 2023-09-27 NOTE — Progress Notes (Signed)
 @Patient  ID: Lauren Chapman, female    DOB: [birth year], 53 y.o.   MRN: [id number]  Chief Complaint  Patient presents with   Sleep Apnea    Not using CPAP machine     Referring provider: Levora Reyes SAUNDERS, MD  HPI: 53 year old female, former smoker (Quit June 2023) followed for asthma and emphysema.  She is a patient of Dr. Cathye and last seen in office 07/18/2022 by [facility] NP. Past medical history significant for bipolar disorder, OSA on CPAP, obesity, migraines, GERD, anxiety.  She was hospitalized for acute hypoxic respiratory failure related to CAP from 07/18/2021 to 07/23/2021.  COVID/flu negative.  She was treated with Rocephin  and Zithromax  along with IV Solu-Medrol .  She was discharged on supplemental oxygen at 2 to 4 L and prednisone  40 mg for 7 days.  She was also prescribed Symbicort  for maintenance and advised to follow-up with pulmonary outpatient.  TEST/EVENTS:  09/09/2018 PFTs: FVC 82, FEV1 84, ratio 82, TLC 102, DLCOcor 88. No BD. Normal PFTs 07/18/2021 CXR: questionable mild lingular pneumonia; clear upper lobes 07/31/2021 CXR 1 view: subtle opacity at lateral left base, similar to prior and likely atelectasis, scarring or resolving pna.  09/05/2021 PFTs: FVC 56, FEV1 58, ratio 82, TLC 83, DLCOcor 76. +21% BD response. Moderate obstruction with reversibility  09/07/2021 LDCT chest: atherosclerosis. Mild emphysema. Small solid nodule in the RUL, benign appearance. Lung RADS 2 09/10/2023 LCS CT chest: atherosclerosis. Emphysema. Diffuse bronchial wall thickening. Stable 3.7 mm, RUL. Lung RADS 2  08/02/2021: SHERLEAN with Azyiah Bo NP for hospital follow up. She reports that she has been slowly recovering since being discharged. She is still feeling a little fatigued and has an occasional congested, non-productive cough; however, both of these are improving. She was able to come off of her oxygen and has been in the 90's on room air. Her breathing feels much better on Symbicort  and she hasn't had  to use her albuterol  but 2-3 times since being discharged. Prior to admission, she was using it almost daily. She denies any fevers, night sweats, hemoptysis, anorexia, weight loss, wheezing. She has quit smoking and is currently using nicotine  patches provided by her insurance to help her remain smoke free. She is taking mucinex  for chest congestion/cough and using her Symbicort  Twice daily.  PFTs ordered for further evaluation.  Referred to lung cancer screening program. She did go back to the ED on 7/9 after a syncopal episode. She had developed some joint pain and headache prior to the event. Respiratory status was stable with O2 98% on room air and ED workup was unremarkable. Suspect related to orthostatic or vasovagal syncope and possible from dehydration - treated with IV fluids and headache cocktail and discharged home. She reports feeling better since with no recurrent episodes. She has follow up scheduled with her PCP on Friday.  She is experiencing some itching and discomfort in her vaginal area and is concerned she may have developed a yeast infection from the antibiotics she was on. She has had a small amount of discharge. No odor, bleeding or pain. No urinary symptoms.  Treated with fluconazole  x1 and instructed to follow-up with PCP if symptoms do not improve.  09/05/2021: OV with Verma Grothaus NP for follow-up after undergoing pulmonary function testing.  She reports that she has been doing well since she was seen last.  Feels like all of her symptoms have resolved.  She no longer has a cough and the chest congestion has gone away.  Breathing feels  like it is relatively stable.  She did stop using Symbicort  and is using albuterol  every other day as she still gets winded with some activities.  Does not notice any significant wheezing.  Does also occasionally have some chest tightness with the shortness of breath.  Denies any lower extremity swelling, hemoptysis, fevers, night sweats, palpitations.  She has  upcoming CT chest in 2 days for lung cancer screening.  No concerns or complaints today.   12/06/2021: Ov with Joyceline Maiorino NP for follow up. She has been doing well since she was here last. Feels like she is better since starting on the Symbicort  consistently. Activity tolerance has improved. No significant cough or chest congestion. Hasn't noticed wheezing. She has been able to decrease frequency of albuterol ; previously was using it multiple times a day. Since starting Symbicort , she's down to using it once a week, at max. No concerns or complaints today.   06/06/2022: Ov with Macarena Langseth NP Patient presents today for follow up. She has been doing well from a breathing standpoint since she was here last. She rarely has to use her rescue inhaler, maybe 3 times a month. She has not had any abx or steroids for her breathing. No significant cough, chest congestion, wheezing. She does have some trouble with anxiety, especially if she has an chest tightness. Feels like it might be some PTSD related to her previous hospitalization. She does see a Veterinary surgeon and psychiatrist. She is on Abilify , celexa , wellbutrin , lamictal .   09/27/2023: Today - follow up Discussed the use of AI scribe software for clinical note transcription with the patient, who gave verbal consent to proceed.  History of Present Illness Lauren Chapman is a 53 year old female with asthma who presents for a follow-up visit.  Asthma has been stable with the use of a rescue inhaler approximately once a month. She continues to use Symbicort  twice daily. There have been no recent courses of prednisone  or antibiotics for her breathing since the last visit.  She has started vaping recently and finds it difficult to quit. She experiences some coughing, which she attributes to post-nasal drainage due to allergies, particularly during the current ragweed season. No wheezing is reported.  She is currently taking Nexium  and requests a refill for  Symbicort .    Allergies  Allergen Reactions   Codeine Anaphylaxis and Other (See Comments)    Cannot have ANYTHING with codeine    Immunization History  Administered Date(s) Administered   Fluad Quad(high Dose 65+) 01/04/2023   Influenza Inj Mdck Quad Pf 01/03/2019   Influenza Split 10/11/2011   Influenza,inj,Quad PF,6+ Mos 12/04/2017, 12/01/2019, 11/07/2021   Influenza-Unspecified 01/03/2019   PFIZER(Purple Top)SARS-COV-2 Vaccination 04/19/2019, 05/10/2019, 01/22/2020, 10/26/2020, 11/07/2021, 01/04/2023   PNEUMOCOCCAL CONJUGATE-20 08/02/2020   Pfizer Covid-19 Vaccine Bivalent Booster 77yrs & up 05/15/2020   Tdap 10/16/2018   Zoster Recombinant(Shingrix) 05/31/2020, 08/02/2020    Past Medical History:  Diagnosis Date   Allergy    Anxiety    Bipolar affective psychosis (HCC)    Complication of anesthesia     Acting silly- waving at everyone-Giddy   Depression    Emphysema lung (HCC)    inhaler prn,  followed by pcp and pulmonologist-- dr frances   Family history of adverse reaction to anesthesia    Father had rheumatic fever, and died on operating table.   GERD (gastroesophageal reflux disease)    Kidney cysts 01/24/2020   Right Kidney   Migraine    Moderate persistent asthma 09/05/2021  Sleep apnea    Smokers' cough (HCC)    per pt not productive   SUI (stress urinary incontinence, female)    Tubular adenoma of colon 07/08/2020   Wears glasses     Tobacco History: Social History   Tobacco Use  Smoking Status Former   Current packs/day: 0.00   Average packs/day: 1 pack/day for 39.8 years (39.8 ttl pk-yrs)   Types: Cigarettes   Start date: 10/10/1981   Quit date: 07/18/2021   Years since quitting: 2.1   Passive exposure: Never  Smokeless Tobacco Never   Counseling given: Not Answered   Outpatient Medications Prior to Visit  Medication Sig Dispense Refill   albuterol  (VENTOLIN  HFA) 108 (90 Base) MCG/ACT inhaler Inhale 2 puffs into the lungs every 4  (four) hours as needed for wheezing or shortness of breath. 1 each 1   busPIRone  (BUSPAR ) 15 MG tablet Take 1 tablet (15 mg total) by mouth 3 (three) times daily. 30 tablet 0   citalopram  (CELEXA ) 40 MG tablet Take 1 tablet (40 mg total) by mouth daily. 30 tablet 0   EPINEPHrine  0.3 mg/0.3 mL IJ SOAJ injection Inject 0.3 mg into the muscle as needed for anaphylaxis. 1 each 0   esomeprazole  (NEXIUM ) 20 MG capsule Take 1 capsule (20 mg total) by mouth 2 (two) times daily before a meal. (Patient taking differently: Take 20 mg by mouth daily at 12 noon.) 60 capsule 1   gabapentin  (NEURONTIN ) 400 MG capsule Take 2 capsules (800 mg total) by mouth 3 (three) times daily.     metoprolol tartrate (LOPRESSOR) 25 MG tablet Take 25 mg by mouth 2 (two) times daily.     MYRBETRIQ  50 MG TB24 tablet Take 50 mg by mouth daily.     ondansetron  (ZOFRAN ) 4 MG tablet Take 1 tablet (4 mg total) by mouth every 8 (eight) hours as needed for nausea or vomiting. 20 tablet 6   ramelteon (ROZEREM) 8 MG tablet Take 8 mg by mouth at bedtime.     Rimegepant Sulfate (NURTEC) 75 MG TBDP Take 1 tablet (75 mg total) by mouth daily as needed. 8 tablet 11   SUMAtriptan  (IMITREX ) 6 MG/0.5ML SOLN injection Inject 0.5 mLs (6 mg total) into the skin See admin instructions. INJECT 6 MG (0.5 ml) INTO THE SKIN AS NEEDED FOR MIGRAINE OR HEADACHE. MAY REPEAT ONCE IN 2 HOURS IF HEADACHE PERSISTS OR RECURS. MAX 2 DOSES IN 24 HOURS. 3 mL 5   thiothixene (NAVANE) 10 MG capsule Take 20 mg by mouth at bedtime.     tiZANidine  (ZANAFLEX ) 4 MG tablet Take 1 tablet (4 mg total) by mouth every 6 (six) hours as needed for muscle spasms. 30 tablet 5   budesonide -formoterol  (SYMBICORT ) 160-4.5 MCG/ACT inhaler INHALE 2 PUFFS INTO THE LUNGS 2 TIMES DAILY. 10.2 g 0   DEPAKOTE 125 MG DR tablet Take 125 mg by mouth 3 (three) times daily.     No facility-administered medications prior to visit.     Review of Systems:   Constitutional: No weight loss or gain,  night sweats, fevers, chills, fatigue, lassitude HEENT: No headaches, difficulty swallowing, tooth/dental problems, or sore throat. No sneezing, itching, ear ache, nasal congestion, +occasional post nasal drip CV:  No chest pain, orthopnea, PND, swelling in lower extremities, anasarca, dizziness, palpitations, syncope Resp: +shortness of breath with strenuous exertion; minimal cough. No excess mucus or change in color of mucus. No hemoptysis. No wheezing.  No chest wall deformity Skin: No rash, lesions, ulcerations MSK:  No joint pain or swelling.   Neuro: No dizziness or lightheadedness.  Psych: No increased depression. +anxiety. No SI/HI. Mood stable.     Physical Exam:  BP 102/68   Pulse 61   Ht 5' 3 (1.6 m)   Wt 162 lb (73.5 kg)   LMP 05/02/2019 Comment: irregular cycles  SpO2 100%   BMI 28.70 kg/m   GEN: Pleasant, interactive, well-appearing; obese; in no acute distress. HEENT:  Normocephalic and atraumatic. PERRLA. Sclera white. Nasal turbinates pink, moist and patent bilaterally. No rhinorrhea present. Oropharynx pink and moist, without exudate or edema. No lesions, ulcerations, or postnasal drip.  NECK:  Supple w/ fair ROM. No lymphadenopathy.   CV: RRR, no m/r/g PULMONARY:  Unlabored, regular breathing. Clear bilaterally A&P w/o wheezes/rales/rhonchi. No accessory muscle use. No dullness to percussion. GI: BS present and normoactive. Soft, non-tender to palpation.  GU: Deferred by pt. MSK: No erythema, warmth or tenderness. Cap refil <2 sec all extrem.  Neuro: A/Ox3. No focal deficits noted.   Skin: Warm, no lesions or rashe Psych: Normal affect and behavior. Judgement and thought content appropriate.     Lab Results:  CBC    Component Value Date/Time   WBC 7.6 01/18/2023 1825   RBC 4.19 01/18/2023 1825   HGB 11.2 (L) 01/18/2023 1825   HGB 15.2 03/24/2019 1510   HCT 34.8 (L) 01/18/2023 1825   HCT 44.8 03/24/2019 1510   PLT 283 01/18/2023 1825   PLT 241  03/24/2019 1510   MCV 83.1 01/18/2023 1825   MCV 94 03/24/2019 1510   MCH 26.7 01/18/2023 1825   MCHC 32.2 01/18/2023 1825   RDW 15.1 01/18/2023 1825   RDW 13.1 03/24/2019 1510   LYMPHSABS 2.3 10/30/2021 1743   LYMPHSABS 2.6 03/24/2019 1510   MONOABS 0.4 10/30/2021 1743   EOSABS 0.1 10/30/2021 1743   EOSABS 0.2 03/24/2019 1510   BASOSABS 0.0 10/30/2021 1743   BASOSABS 0.1 03/24/2019 1510    BMET    Component Value Date/Time   NA 141 08/30/2023 0948   NA 143 03/24/2019 1510   K 4.4 08/30/2023 0948   CL 98 08/30/2023 0948   CO2 25 08/30/2023 0948   GLUCOSE 83 08/30/2023 0948   BUN 16 08/30/2023 0948   BUN 10 03/24/2019 1510   CREATININE 1.08 08/30/2023 0948   CREATININE 1.12 (H) 08/17/2015 1608   CALCIUM 9.5 08/30/2023 0948   GFRNONAA >60 01/18/2023 1825   GFRAA 71 03/24/2019 1510    BNP No results found for: BNP   Imaging:  CT CHEST LUNG CA SCREEN LOW DOSE W/O CM Result Date: 09/23/2023 CLINICAL DATA:  53 year old female former smoker with 40 pack-year smoking history, quit smoking 2023 EXAM: CT CHEST WITHOUT CONTRAST LOW-DOSE FOR LUNG CANCER SCREENING TECHNIQUE: Multidetector CT imaging of the chest was performed following the standard protocol without IV contrast. RADIATION DOSE REDUCTION: This exam was performed according to the departmental dose-optimization program which includes automated exposure control, adjustment of the mA and/or kV according to patient size and/or use of iterative reconstruction technique. COMPARISON:  01/18/2023 chest CT angiogram. 09/09/2022 screening chest CT. FINDINGS: Cardiovascular: Normal heart size. No significant pericardial effusion/thickening. Great vessels are normal in course and caliber. Mediastinum/Nodes: No significant thyroid  nodules. Unremarkable esophagus. No pathologically enlarged axillary, mediastinal or hilar lymph nodes, noting limited sensitivity for the detection of hilar adenopathy on this noncontrast study.  Lungs/Pleura: No pneumothorax. No pleural effusion. Mild centrilobular emphysema with diffuse bronchial wall thickening. No acute consolidative airspace disease or  lung masses. Stable 3.7 mm anterior apical right upper lobe solid pulmonary nodule on image 59. No new significant pulmonary nodules. Upper abdomen: Cholecystectomy. Musculoskeletal: No aggressive appearing focal osseous lesions. Moderate thoracic spondylosis. IMPRESSION: 1. Lung-RADS 2, benign appearance or behavior. Continue annual screening with low-dose chest CT without contrast in 12 months. 2.  Emphysema (ICD10-J43.9). Electronically Signed   By: Selinda DELENA Blue M.D.   On: 09/23/2023 16:01    lidocaine  (XYLOCAINE ) 1 % (with pres) injection 4 mL     Date Action Dose Route User   09/14/2023 1036 Given 4 mL Other (Right Hip) Burnetta Brunet, DO      methylPREDNISolone  acetate (DEPO-MEDROL ) injection 80 mg     Date Action Dose Route User   09/14/2023 1036 Given 80 mg Intra-articular (Right Hip) Burnetta Brunet, DO           Latest Ref Rng & Units 09/05/2021   10:37 AM 09/09/2018    2:07 PM  PFT Results  FVC-Pre L 1.93  2.91   FVC-Predicted Pre % 56  82   FVC-Post L 2.32  3.04   FVC-Predicted Post % 67  85   Pre FEV1/FVC % % 82  81   Post FEV1/FCV % % 82  82   FEV1-Pre L 1.58  2.36   FEV1-Predicted Pre % 58  84   FEV1-Post L 1.91  2.50   DLCO uncorrected ml/min/mmHg 15.60  19.28   DLCO UNC% % 76  91   DLCO corrected ml/min/mmHg 15.55  18.63   DLCO COR %Predicted % 76  88   DLVA Predicted % 91  102   TLC L 4.17  5.18   TLC % Predicted % 83  102   RV % Predicted % 110  130     No results found for: NITRICOXIDE      Assessment & Plan:   Asthma Mild persistent asthma. Attempted to de-escalate her ICS last year but had a mild flare as a result. Does well on high dose Symbicort . No recent exacerbations or hospitalizations for her breathing. Action plan in place. Trigger prevention reviewed.  Patient Instructions   Continue Albuterol  inhaler 2 puffs or 3 mL neb every 6 hours as needed for shortness of breath or wheezing. Notify if symptoms persist despite rescue inhaler/neb use. Continue nexium  20 mg Twice daily  Continue Symbicort  160 mcg 2 puffs Twice daily. Brush tongue and rinse mouth afterwards   Follow up in one year with Dr Neda or Izetta Malachy PIETY. If symptoms do not improve or worsen, please contact office for sooner follow up or seek emergency care.   Emphysema, unspecified (HCC) Mild emphysema on imaging. No formal obstruction on PFT. Continue to monitor.   Former smoker Quit 2023. Now vaping; cessation strongly encouraged. Continue annual lung cancer screening CTs.      I spent 25 minutes of dedicated to the care of this patient on the date of this encounter to include pre-visit review of records, face-to-face time with the patient discussing conditions above, post visit ordering of testing, clinical documentation with the electronic health record, making appropriate referrals as documented, and communicating necessary findings to members of the patients care team.  Comer LULLA Malachy, NP 09/27/2023  Pt aware and understands NP's role.

## 2023-09-27 NOTE — Assessment & Plan Note (Signed)
 Mild emphysema on imaging. No formal obstruction on PFT. Continue to monitor.

## 2023-09-28 ENCOUNTER — Ambulatory Visit

## 2023-09-28 VITALS — BP 104/72 | HR 70 | Temp 98.0°F | Resp 16 | Ht 63.0 in | Wt 164.4 lb

## 2023-09-28 DIAGNOSIS — G43709 Chronic migraine without aura, not intractable, without status migrainosus: Secondary | ICD-10-CM

## 2023-09-28 MED ORDER — SODIUM CHLORIDE 0.9 % IV SOLN
300.0000 mg | Freq: Once | INTRAVENOUS | Status: AC
  Administered 2023-09-28: 300 mg via INTRAVENOUS
  Filled 2023-09-28: qty 3

## 2023-09-28 NOTE — Progress Notes (Signed)
 Diagnosis: Chronic Migraines  Provider:  Lonna Coder MD  Procedure: IV Infusion  IV Type: Peripheral, IV Location: L Hand  Vyepti  (Eptinezumab -jjmr), Dose: 300 mg  Infusion Start Time: 1544  Infusion Stop Time: 1620  Post Infusion IV Care: Peripheral IV Discontinued  Discharge: Condition: Good, Destination: Home . AVS Provided  Performed by:  Eleanor DELENA Bloch, RN

## 2023-10-02 ENCOUNTER — Other Ambulatory Visit: Payer: Self-pay

## 2023-10-02 ENCOUNTER — Ambulatory Visit: Attending: Orthopaedic Surgery | Admitting: Physical Therapy

## 2023-10-02 ENCOUNTER — Encounter: Payer: Self-pay | Admitting: Physical Therapy

## 2023-10-02 DIAGNOSIS — M6281 Muscle weakness (generalized): Secondary | ICD-10-CM | POA: Diagnosis present

## 2023-10-02 DIAGNOSIS — M25551 Pain in right hip: Secondary | ICD-10-CM | POA: Diagnosis present

## 2023-10-02 NOTE — Therapy (Signed)
 OUTPATIENT PHYSICAL THERAPY LOWER EXTREMITY EVALUATION  Patient Name: Lauren Chapman MRN: [id number] DOB:Oct 04, 1970, 53 y.o., female Today's Date: 10/02/2023   PT End of Session - 10/02/23 0959     Visit Number 1    Number of Visits --   1-2x/week   Date for PT Re-Evaluation 11/27/23    Authorization Type UHC duel complete    Progress Note Due on Visit 10    PT Start Time 0930    PT Stop Time 1005    PT Time Calculation (min) 35 min          Past Medical History:  Diagnosis Date   Allergy    Anxiety    Bipolar affective psychosis (HCC)    Complication of anesthesia     Acting silly- waving at everyone-Giddy   Depression    Emphysema lung (HCC)    inhaler prn,  followed by pcp and pulmonologist-- dr frances   Family history of adverse reaction to anesthesia    Father had rheumatic fever, and died on operating table.   GERD (gastroesophageal reflux disease)    Kidney cysts 01/24/2020   Right Kidney   Migraine    Moderate persistent asthma 09/05/2021   Sleep apnea    Smokers' cough (HCC)    per pt not productive   SUI (stress urinary incontinence, female)    Tubular adenoma of colon 07/08/2020   Wears glasses    Past Surgical History:  Procedure Laterality Date   ABDOMINAL HYSTERECTOMY N/A    Phreesia 11/30/2019   bladder stimulator     CARPAL TUNNEL RELEASE Right 09-12-2007   @[facility]    and GANGLION CYST EXCISION   COLONOSCOPY     CYSTOSCOPY N/A 04/29/2019   Procedure: CYSTOSCOPY;  Surgeon: Arnaldo Purchase, MD;  Location: [facility];  Service: Gynecology;  Laterality: N/A;   DILATATION & CURETTAGE/HYSTEROSCOPY WITH MYOSURE N/A 11/13/2018   Procedure: DILATATION & CURETTAGE/HYSTEROSCOPY WITH MYOSURE;  Surgeon: Rockney Evalene SQUIBB, MD;  Location: [facility];  Service: Gynecology;  Laterality: N/A;  request 9:00am OR start time in [place] Gyn block requests one hour   DILATION AND CURETTAGE OF UTERUS  02/2019   EYE SURGERY N/A     Phreesia 11/30/2019   GANGLION CYST EXCISION     R wrist   GREAT TOE ARTHRODESIS, JONES PROCEDURE     hammer toe 2024   interstem      implant for overactive bladder   KNEE ARTHROSCOPY WITH ANTERIOR CRUCIATE LIGAMENT (ACL) REPAIR Right 10/2015   LAPAROSCOPIC CHOLECYSTECTOMY  1997   LAPAROSCOPIC HYSTERECTOMY Bilateral 04/29/2019   Procedure: HYSTERECTOMY TOTAL LAPAROSCOPIC, BILATERAL SALPINO-OOPHORECTOMY;  Surgeon: Arnaldo Purchase, MD;  Location: [facility] [place];  Service: Gynecology;  Laterality: Bilateral;   LAPAROSCOPIC TOTAL HYSTERECTOMY     TOOTH EXTRACTION  04/01/2019   tooth removal Bilateral 2022   4 teeth removed   UPPER GASTROINTESTINAL ENDOSCOPY     Patient Active Problem List   Diagnosis Date Noted   Former smoker 09/27/2023   Primary osteoarthritis of right hip 09/14/2023   Morbid obesity (HCC) 05/22/2023   Schizoaffective disorder (HCC) 03/01/2023   Asthma 02/15/2023   Intractable chronic migraine with aura with status migrainosus 02/15/2023   Overactive bladder 02/15/2023   Poorly controlled mild persistent asthma 09/05/2021   Syncope 08/02/2021   CAP (community acquired pneumonia) 07/18/2021   OSA (obstructive sleep apnea) 07/18/2021   Hypokalemia 07/18/2021   Depression 03/24/2020   Left lumbar radiculopathy 02/25/2020   Absence  of bladder continence 02/25/2020   History of postmenopausal bleeding 04/29/2019   Polypharmacy 04/03/2019   Acute low back pain 07/31/2018   Emphysema, unspecified (HCC) 05/17/2018   Weakness 01/25/2017   Chronic migraine w/o aura w/o status migrainosus, not intractable 12/22/2014   Encounter for other general counseling or advice on contraception 09/02/2014   Bipolar disorder (HCC) 10/12/2011   Anxiety 10/12/2011   GERD (gastroesophageal reflux disease) 10/12/2011    PCP: Levora Reyes SAUNDERS, MD  REFERRING PROVIDER: Jerri Kay HERO, MD  THERAPY DIAG:  Pain in right hip - Plan: PT plan of care  cert/re-cert  Muscle weakness - Plan: PT plan of care cert/re-cert  REFERRING DIAG: R hip pain  Rationale for Evaluation and Treatment:  Rehabilitation  SUBJECTIVE:  PERTINENT PAST HISTORY:  Bipolar, anxiety, emphysema, depression, schizoaffective disorder, migraines, R ACL surgery         PRECAUTIONS: None  WEIGHT BEARING RESTRICTIONS No  FALLS:  Has patient fallen in last 6 months? No, Number of falls: 0  MOI/History of condition:  Onset date: 6 months +  SUBJECTIVE STATEMENT  Pt is a 53 y.o. female who presents to clinic with chief complaint of R hip and R LE pain which started about 6 months ago.  No significant change in activity 6 months ago.  Slow onset.  Imaging largely unremarkable.  Worst when she lays down. Describes a constant ache.  Denies n/t in R LE.  Muscle relaxer is not helpful.  Excedrin helps with pain.  Feels origin is hip but can radiate all the way to ankle.  Worst first thing in the morning and gets better with movement.  Denies back pain.   Red flags:  denies   Pain:  Are you having pain? Yes Pain location: R hip pain, R LE pain NPRS scale:  Best: 2/10, Worst: 8/10 Aggravating factors: laying down, laying on R side Relieving factors: movement, Excedrin Pain description: aching  Occupation: NA  Assistive Device: NA  Hand Dominance: NA  Patient Goals/Specific Activities: reduce pain   OBJECTIVE:   GENERAL OBSERVATION/GAIT: Slight antalgic gait R  SENSATION: Light touch: Appears intact   LE MMT:  MMT Right (Eval) Left (Eval)  Hip flexion (L2, L3) 3+* 4+  Knee extension (L3) 4 4  Knee flexion 4 4  Hip abduction 3+* 4+  Hip extension    Hip external rotation 3+*   Hip internal rotation 4+   Hip adduction    Ankle dorsiflexion (L4)    Ankle plantarflexion (S1)    Ankle inversion    Ankle eversion    Great Toe ext (L5)    Grossly     (Blank rows = not tested, score listed is out of 5 possible points.  N = WNL, D =  diminished, C = clear for gross weakness with myotome testing, * = concordant pain with testing)  LE ROM:  ROM Right (Eval) Left (Eval)  Hip flexion    Hip extension    Hip abduction    Hip adduction    Hip internal rotation limited limited  Hip external rotation Slight limitation WNL  Knee extension    Knee flexion    Ankle dorsiflexion    Ankle plantarflexion    Ankle inversion    Ankle eversion     (Blank rows = not tested, N = WNL, * = concordant pain with testing)  Functional Tests  Eval    30'' STS: 12x  UE used? n  SPECIAL TESTS:  Fadir, faber, scour (+) R hip   PATIENT SURVEYS:  LEFS: 45/80   TODAY'S TREATMENT:  Therapeutic Exercise: Creating, reviewing, and completing below HEP   PATIENT EDUCATION ([place]/HM):  POC, diagnosis, prognosis, HEP, and outcome measures.  Pt educated via explanation, demonstration, and handout (HEP).  Pt confirms understanding verbally.   HOME EXERCISE PROGRAM: Access Code: [email or web address] URL: [email or web address] Date: 10/02/2023 Prepared by: Helene Gasmen  Exercises - Supine Hip Adduction Isometric with Ball  - 1 x daily - 7 x weekly - 2 sets - 10 reps - 10'' hold - Hooklying Isometric Clamshell  - 1 x daily - 7 x weekly - 3 sets - 10 reps - Bridge  - 1 x daily - 7 x weekly - 3 sets - 10 reps - 5-10'' hold  Treatment priorities   Eval        Hip ER/ext/abd strength        General core and LQ strengthening         Walking program                          ASSESSMENT:  CLINICAL IMPRESSION: Talasia is a 53 y.o. female who presents to clinic with signs and sxs consistent with R hip pain.   Consistent with physician impression of degenerative changes.  Somewhat atypical referral pattern below knee, but pt with no back pain and reproduction of concordant pain with interarticular hip special tests.   Kaydynce will benefit from skilled PT to  address relevant deficits and improve comfort with daily tasks.   OBJECTIVE IMPAIRMENTS: Pain, R hip strength, R hip ROM, gait  ACTIVITY LIMITATIONS: transfers, sleeping, bending, squatting  PERSONAL FACTORS: See medical history and pertinent history   REHAB POTENTIAL: Good  CLINICAL DECISION MAKING: Evolving/moderate complexity  EVALUATION COMPLEXITY: Moderate   GOALS:   SHORT TERM GOALS: Target date: 10/30/2023   Kailey will be >75% HEP compliant to improve carryover between sessions and facilitate independent management of condition  Evaluation: ongoing Goal status: INITIAL   LONG TERM GOALS: Target date: 11/27/2023  Kamalei will self report >/= 50% decrease in pain from evaluation to improve function in daily tasks  Evaluation/Baseline: 8/10 max pain Goal status: INITIAL   2.  Anjana will show a >/= 15 pt improvement in LEFS score (MCID is ~11% or 9 pts) as a proxy for functional improvement   Evaluation/Baseline: 45 pts Goal status: INITIAL   3.  Suheyla will start regular walking program, not limited by pain  Evaluation/Baseline: limited Goal status: INITIAL   4.  Danaysha will self report significant improvement in ability to sleep  Evaluation/Baseline: Poor sleep d/t pain - requires medication Goal status: INITIAL    PLAN: PT FREQUENCY: 1-2x/week  PT DURATION: 8 weeks  PLANNED INTERVENTIONS:  97164- PT Re-evaluation, 97110-Therapeutic exercises, 97530- Therapeutic activity, [id number]- Neuromuscular re-education, 97535- Self Care, 02859- Manual therapy, [id number]- Gait training, [id number]- Aquatic Therapy, [phone]- Electrical stimulation (manual), [id number]- Vasopneumatic device, [id number]- Traction (mechanical), [id number]- Ionotophoresis 4mg /ml Dexamethasone , Taping, Dry Needling, Joint manipulation, and Spinal manipulation.   Cornelius Marullo PT, DPT 10/02/2023, 10:16 AM

## 2023-10-08 NOTE — Therapy (Signed)
 OUTPATIENT PHYSICAL THERAPY LOWER EXTREMITY TREATMENT  Patient Name: Lauren Chapman MRN: [id number] DOB:[birth year], 53 y.o., female Today's Date: 10/10/2023   PT End of Session - 10/10/23 0552     Visit Number 2    Number of Visits --   1-2x/week   Date for PT Re-Evaluation 11/27/23    Authorization Type UHC duel complete    PT Start Time 1547    PT Stop Time 1630    PT Time Calculation (min) 43 min    Activity Tolerance Patient tolerated treatment well;Patient limited by pain    Behavior During Therapy WFL for tasks assessed/performed           Past Medical History:  Diagnosis Date   Allergy    Anxiety    Bipolar affective psychosis (HCC)    Complication of anesthesia     Acting silly- waving at everyone-Giddy   Depression    Emphysema lung (HCC)    inhaler prn,  followed by pcp and pulmonologist-- dr frances   Family history of adverse reaction to anesthesia    Father had rheumatic fever, and died on operating table.   GERD (gastroesophageal reflux disease)    Kidney cysts 01/24/2020   Right Kidney   Migraine    Moderate persistent asthma 09/05/2021   Sleep apnea    Smokers' cough (HCC)    per pt not productive   SUI (stress urinary incontinence, female)    Tubular adenoma of colon 07/08/2020   Wears glasses    Past Surgical History:  Procedure Laterality Date   ABDOMINAL HYSTERECTOMY N/A    Phreesia 11/30/2019   bladder stimulator     CARPAL TUNNEL RELEASE Right 09-12-2007   @[facility]    and GANGLION CYST EXCISION   COLONOSCOPY     CYSTOSCOPY N/A 04/29/2019   Procedure: CYSTOSCOPY;  Surgeon: Arnaldo Purchase, MD;  Location: [facility];  Service: Gynecology;  Laterality: N/A;   DILATATION & CURETTAGE/HYSTEROSCOPY WITH MYOSURE N/A 11/13/2018   Procedure: DILATATION & CURETTAGE/HYSTEROSCOPY WITH MYOSURE;  Surgeon: Rockney Evalene SQUIBB, MD;  Location: [facility];  Service: Gynecology;  Laterality: N/A;  request 9:00am OR start time  in [place] Gyn block requests one hour   DILATION AND CURETTAGE OF UTERUS  02/2019   EYE SURGERY N/A    Phreesia 11/30/2019   GANGLION CYST EXCISION     R wrist   GREAT TOE ARTHRODESIS, JONES PROCEDURE     hammer toe 2024   interstem      implant for overactive bladder   KNEE ARTHROSCOPY WITH ANTERIOR CRUCIATE LIGAMENT (ACL) REPAIR Right 10/2015   LAPAROSCOPIC CHOLECYSTECTOMY  1997   LAPAROSCOPIC HYSTERECTOMY Bilateral 04/29/2019   Procedure: HYSTERECTOMY TOTAL LAPAROSCOPIC, BILATERAL SALPINO-OOPHORECTOMY;  Surgeon: Arnaldo Purchase, MD;  Location: [facility] [place];  Service: Gynecology;  Laterality: Bilateral;   LAPAROSCOPIC TOTAL HYSTERECTOMY     TOOTH EXTRACTION  04/01/2019   tooth removal Bilateral 2022   4 teeth removed   UPPER GASTROINTESTINAL ENDOSCOPY     Patient Active Problem List   Diagnosis Date Noted   Former smoker 09/27/2023   Primary osteoarthritis of right hip 09/14/2023   Morbid obesity (HCC) 05/22/2023   Schizoaffective disorder (HCC) 03/01/2023   Asthma 02/15/2023   Intractable chronic migraine with aura with status migrainosus 02/15/2023   Overactive bladder 02/15/2023   Poorly controlled mild persistent asthma 09/05/2021   Syncope 08/02/2021   CAP (community acquired pneumonia) 07/18/2021   OSA (obstructive sleep apnea) 07/18/2021   Hypokalemia  07/18/2021   Depression 03/24/2020   Left lumbar radiculopathy 02/25/2020   Absence of bladder continence 02/25/2020   History of postmenopausal bleeding 04/29/2019   Polypharmacy 04/03/2019   Acute low back pain 07/31/2018   Emphysema, unspecified (HCC) 05/17/2018   Weakness 01/25/2017   Chronic migraine w/o aura w/o status migrainosus, not intractable 12/22/2014   Encounter for other general counseling or advice on contraception 09/02/2014   Bipolar disorder (HCC) 10/12/2011   Anxiety 10/12/2011   GERD (gastroesophageal reflux disease) 10/12/2011    PCP: Levora Reyes SAUNDERS, MD  REFERRING  PROVIDER: Jerri Kay HERO, MD  THERAPY DIAG:  Pain in right hip  Muscle weakness  REFERRING DIAG: R hip pain  Rationale for Evaluation and Treatment:  Rehabilitation  SUBJECTIVE:  PERTINENT PAST HISTORY:  Bipolar, anxiety, emphysema, depression, schizoaffective disorder, migraines, R ACL surgery         PRECAUTIONS: None  WEIGHT BEARING RESTRICTIONS No  FALLS:  Has patient fallen in last 6 months? No, Number of falls: 0  MOI/History of condition:  Onset date: 6 months +  SUBJECTIVE STATEMENT Pt reports her r hip pain is about the same. The HEP do not making it hurt worse.  EVAL: Pt is a 53 y.o. female who presents to clinic with chief complaint of R hip and R LE pain which started about 6 months ago.  No significant change in activity 6 months ago.  Slow onset.  Imaging largely unremarkable.  Worst when she lays down. Describes a constant ache.  Denies n/t in R LE.  Muscle relaxer is not helpful.  Excedrin helps with pain.  Feels origin is hip but can radiate all the way to ankle.  Worst first thing in the morning and gets better with movement.  Denies back pain.   Red flags:  denies   Pain:  Are you having pain? Yes Pain location: R hip pain, R LE pain NPRS scale: 10/09/23: 3/10 Best: 2/10, Worst: 8/10 Aggravating factors: laying down, laying on R side Relieving factors: movement, Excedrin Pain description: aching  Occupation: NA  Assistive Device: NA  Hand Dominance: NA  Patient Goals/Specific Activities: reduce pain   OBJECTIVE:   GENERAL OBSERVATION/GAIT: Slight antalgic gait R  SENSATION: Light touch: Appears intact   LE MMT:  MMT Right (Eval) Left (Eval)  Hip flexion (L2, L3) 3+* 4+  Knee extension (L3) 4 4  Knee flexion 4 4  Hip abduction 3+* 4+  Hip extension    Hip external rotation 3+*   Hip internal rotation 4+   Hip adduction    Ankle dorsiflexion (L4)    Ankle plantarflexion (S1)    Ankle inversion    Ankle eversion    Great Toe  ext (L5)    Grossly     (Blank rows = not tested, score listed is out of 5 possible points.  N = WNL, D = diminished, C = clear for gross weakness with myotome testing, * = concordant pain with testing)  LE ROM:  ROM Right (Eval) Left (Eval)  Hip flexion    Hip extension    Hip abduction    Hip adduction    Hip internal rotation limited limited  Hip external rotation Slight limitation WNL  Knee extension    Knee flexion    Ankle dorsiflexion    Ankle plantarflexion    Ankle inversion    Ankle eversion     (Blank rows = not tested, N = WNL, * = concordant pain with testing)  Functional Tests  Eval    30'' STS: 12x  UE used? n                                                          SPECIAL TESTS:  Fadir, faber, scour (+) R hip   PATIENT SURVEYS:  LEFS: 45/80   TODAY'S TREATMENT: [facility] Adult PT Treatment:                                                DATE: 10/09/23 Therapeutic Exercise: Supine Hip Adduction Isometric with Ball x10 10 Hook lying Isometric Clamshell x10 5 Bridge x10 5'' Piriformis stretch x3 20 Figure 4  x3 20 Hamstring stretch x3 20 S/L hip abd x5, stopped with pt reporting a higher level of pain Quadruped with fwd/bwd weight shifting of within pain tolerance  STS hinged hip x10, pt experienced shortness of breath Standing R hip abd x10  Updated HEP Self Care: With strengthening and ROM exs managing pain within a tolerable range. Pain shoulder lower to a baseline level by the next day.   Therapeutic Exercise: Creating, reviewing, and completing below HEP   PATIENT EDUCATION ([place]/HM):  POC, diagnosis, prognosis, HEP, and outcome measures.  Pt educated via explanation, demonstration, and handout (HEP).  Pt confirms understanding verbally.   HOME EXERCISE PROGRAM: Access Code: [email or web address] URL: [email or web address] Date: 10/09/2023 Prepared by: Dasie Daft  Exercises - Supine Hip Adduction Isometric with  Ball  - 1 x daily - 4-7 x weekly - 2 sets - 10 reps - 10'' hold - Hooklying Isometric Clamshell  - 1 x daily - 4-7 x weekly - 3 sets - 10 reps - Bridge  - 1 x daily - 4-7 x weekly - 3 sets - 5 reps - 3'' hold - Sit to Stand Without Arm Support  - 1 x daily - 4-7 x weekly - 3 sets - 5 reps - Standing Hip Abduction with Counter Support (Mirrored)  - 1 x daily - 4-7 x weekly - 2 sets - 10 reps - Supine Piriformis Stretch with Foot on Ground  - 1 x daily - 7 x weekly - 1 sets - 3 reps - 20 hold - Supine Figure 4 Piriformis Stretch  - 1 x daily - 7 x weekly - 1 sets - 3 reps - 20 hold - Hooklying Hamstring Stretch with Strap  - 1 x daily - 7 x weekly - 1 sets - 3 reps - 20 hold Treatment priorities   Eval        Hip ER/ext/abd strength        General core and LQ strengthening         Walking program                          ASSESSMENT:  CLINICAL IMPRESSION: PT was completed for R hip ROM and strengthening with pt Ed re: managing pain with a tolerable range. With today's session, pt reported her R hip pain increased to 5/10, otherwise the pt tolerated the session without adverse effects. The pt did report she is out of shape, and she did experience shortness of  breath with STS x 10. Will assess pt's longer term response to today's session her next visit.    EVAL: Tom is a 53 y.o. female who presents to clinic with signs and sxs consistent with R hip pain.   Consistent with physician impression of degenerative changes.  Somewhat atypical referral pattern below knee, but pt with no back pain and reproduction of concordant pain with interarticular hip special tests.   Derotha will benefit from skilled PT to address relevant deficits and improve comfort with daily tasks.   OBJECTIVE IMPAIRMENTS: Pain, R hip strength, R hip ROM, gait  ACTIVITY LIMITATIONS: transfers, sleeping, bending, squatting  PERSONAL FACTORS: See medical history and pertinent history   REHAB POTENTIAL: Good  CLINICAL  DECISION MAKING: Evolving/moderate complexity  EVALUATION COMPLEXITY: Moderate   GOALS:   SHORT TERM GOALS: Target date: 10/30/2023   Abisola will be >75% HEP compliant to improve carryover between sessions and facilitate independent management of condition  Evaluation: ongoing Goal status: INITIAL   LONG TERM GOALS: Target date: 11/27/2023  Joslynn will self report >/= 50% decrease in pain from evaluation to improve function in daily tasks  Evaluation/Baseline: 8/10 max pain Goal status: INITIAL   2.  Shailyn will show a >/= 15 pt improvement in LEFS score (MCID is ~11% or 9 pts) as a proxy for functional improvement   Evaluation/Baseline: 45 pts Goal status: INITIAL   3.  Dominic will start regular walking program, not limited by pain  Evaluation/Baseline: limited Goal status: INITIAL   4.  Jahnasia will self report significant improvement in ability to sleep  Evaluation/Baseline: Poor sleep d/t pain - requires medication Goal status: INITIAL    PLAN: PT FREQUENCY: 1-2x/week  PT DURATION: 8 weeks  PLANNED INTERVENTIONS:  97164- PT Re-evaluation, 97110-Therapeutic exercises, 97530- Therapeutic activity, [id number]- Neuromuscular re-education, 97535- Self Care, 02859- Manual therapy, [id number]- Gait training, [id number]- Aquatic Therapy, [phone]- Electrical stimulation (manual), [id number]- Vasopneumatic device, [id number]- Traction (mechanical), [id number]- Ionotophoresis 4mg /ml Dexamethasone , Taping, Dry Needling, Joint manipulation, and Spinal manipulation.   Erickson Yamashiro MS, PT 10/10/23 5:54 AM

## 2023-10-09 ENCOUNTER — Ambulatory Visit

## 2023-10-09 DIAGNOSIS — M25551 Pain in right hip: Secondary | ICD-10-CM

## 2023-10-09 DIAGNOSIS — M6281 Muscle weakness (generalized): Secondary | ICD-10-CM

## 2023-10-17 ENCOUNTER — Encounter: Payer: Self-pay | Admitting: Physical Therapy

## 2023-10-17 ENCOUNTER — Ambulatory Visit: Admitting: Physical Therapy

## 2023-10-17 DIAGNOSIS — M25551 Pain in right hip: Secondary | ICD-10-CM | POA: Diagnosis not present

## 2023-10-17 DIAGNOSIS — M6281 Muscle weakness (generalized): Secondary | ICD-10-CM

## 2023-10-17 NOTE — Therapy (Signed)
 OUTPATIENT PHYSICAL THERAPY LOWER EXTREMITY TREATMENT  Patient Name: Lauren Chapman MRN: [id number] DOB:1970-08-25, 53 y.o., female Today's Date: 10/17/2023   PT End of Session - 10/17/23 1014     Visit Number 3    Number of Visits --   1-2x/week   Date for Recertification  11/27/23    Authorization Type [employer] duel complete    Progress Note Due on Visit 10    PT Start Time 1015    PT Stop Time 1057    PT Time Calculation (min) 42 min    Activity Tolerance Patient tolerated treatment well;Patient limited by pain    Behavior During Therapy WFL for tasks assessed/performed           Past Medical History:  Diagnosis Date   Allergy    Anxiety    Bipolar affective psychosis (HCC)    Complication of anesthesia     Acting silly- waving at everyone-Giddy   Depression    Emphysema lung (HCC)    inhaler prn,  followed by pcp and pulmonologist-- dr frances   Family history of adverse reaction to anesthesia    Father had rheumatic fever, and died on operating table.   GERD (gastroesophageal reflux disease)    Kidney cysts 01/24/2020   Right Kidney   Migraine    Moderate persistent asthma 09/05/2021   Sleep apnea    Smokers' cough (HCC)    per pt not productive   SUI (stress urinary incontinence, female)    Tubular adenoma of colon 07/08/2020   Wears glasses    Past Surgical History:  Procedure Laterality Date   ABDOMINAL HYSTERECTOMY N/A    Phreesia 11/30/2019   bladder stimulator     CARPAL TUNNEL RELEASE Right 09-12-2007   @[facility]    and GANGLION CYST EXCISION   COLONOSCOPY     CYSTOSCOPY N/A 04/29/2019   Procedure: CYSTOSCOPY;  Surgeon: Arnaldo Purchase, MD;  Location: [facility] [place];  Service: Gynecology;  Laterality: N/A;   DILATATION & CURETTAGE/HYSTEROSCOPY WITH MYOSURE N/A 11/13/2018   Procedure: DILATATION & CURETTAGE/HYSTEROSCOPY WITH MYOSURE;  Surgeon: Rockney Evalene SQUIBB, MD;  Location: [facility];  Service: Gynecology;  Laterality:  N/A;  request 9:00am OR start time in [place] Gyn block requests one hour   DILATION AND CURETTAGE OF UTERUS  02/2019   EYE SURGERY N/A    Phreesia 11/30/2019   GANGLION CYST EXCISION     R wrist   GREAT TOE ARTHRODESIS, JONES PROCEDURE     hammer toe 2024   interstem      implant for overactive bladder   KNEE ARTHROSCOPY WITH ANTERIOR CRUCIATE LIGAMENT (ACL) REPAIR Right 10/2015   LAPAROSCOPIC CHOLECYSTECTOMY  1997   LAPAROSCOPIC HYSTERECTOMY Bilateral 04/29/2019   Procedure: HYSTERECTOMY TOTAL LAPAROSCOPIC, BILATERAL SALPINO-OOPHORECTOMY;  Surgeon: Arnaldo Purchase, MD;  Location: [facility] [place];  Service: Gynecology;  Laterality: Bilateral;   LAPAROSCOPIC TOTAL HYSTERECTOMY     TOOTH EXTRACTION  04/01/2019   tooth removal Bilateral 2022   4 teeth removed   UPPER GASTROINTESTINAL ENDOSCOPY     Patient Active Problem List   Diagnosis Date Noted   Former smoker 09/27/2023   Primary osteoarthritis of right hip 09/14/2023   Morbid obesity (HCC) 05/22/2023   Schizoaffective disorder (HCC) 03/01/2023   Asthma 02/15/2023   Intractable chronic migraine with aura with status migrainosus 02/15/2023   Overactive bladder 02/15/2023   Poorly controlled mild persistent asthma 09/05/2021   Syncope 08/02/2021   CAP (community acquired pneumonia) 07/18/2021  OSA (obstructive sleep apnea) 07/18/2021   Hypokalemia 07/18/2021   Depression 03/24/2020   Left lumbar radiculopathy 02/25/2020   Absence of bladder continence 02/25/2020   History of postmenopausal bleeding 04/29/2019   Polypharmacy 04/03/2019   Acute low back pain 07/31/2018   Emphysema, unspecified (HCC) 05/17/2018   Weakness 01/25/2017   Chronic migraine w/o aura w/o status migrainosus, not intractable 12/22/2014   Encounter for other general counseling or advice on contraception 09/02/2014   Bipolar disorder (HCC) 10/12/2011   Anxiety 10/12/2011   GERD (gastroesophageal reflux disease) 10/12/2011    PCP:  Levora Reyes SAUNDERS, MD  REFERRING PROVIDER: Jerri Kay HERO, MD  THERAPY DIAG:  Pain in right hip  Muscle weakness  REFERRING DIAG: R hip pain  Rationale for Evaluation and Treatment:  Rehabilitation  SUBJECTIVE:  PERTINENT PAST HISTORY:  Bipolar, anxiety, emphysema, depression, schizoaffective disorder, migraines, R ACL surgery         PRECAUTIONS: None  WEIGHT BEARING RESTRICTIONS No  FALLS:  Has patient fallen in last 6 months? No, Number of falls: 0  MOI/History of condition:  Onset date: 6 months +  SUBJECTIVE STATEMENT Pt reports that she has been completing her exercises regularly but sees no benefit.  She continues to have pain at night.  Not sure if the exercises are making it worse or not.  She has some pain when actually completing the exercises but this is improving somewhat.  States this is her concordant pain.  EVAL: Pt is a 53 y.o. female who presents to clinic with chief complaint of R hip and R LE pain which started about 6 months ago.  No significant change in activity 6 months ago.  Slow onset.  Imaging largely unremarkable.  Worst when she lays down. Describes a constant ache.  Denies n/t in R LE.  Muscle relaxer is not helpful.  Excedrin helps with pain.  Feels origin is hip but can radiate all the way to ankle.  Worst first thing in the morning and gets better with movement.  Denies back pain.   Red flags:  denies   Pain:  Are you having pain? Yes Pain location: R hip pain, R LE pain NPRS scale:  Best: 2/10, Worst: 8/10 Aggravating factors: laying down, laying on R side Relieving factors: movement, Excedrin Pain description: aching  Occupation: NA  Assistive Device: NA  Hand Dominance: NA  Patient Goals/Specific Activities: reduce pain   OBJECTIVE:   GENERAL OBSERVATION/GAIT: Slight antalgic gait R  SENSATION: Light touch: Appears intact   LE MMT:  MMT Right (Eval) Left (Eval)  Hip flexion (L2, L3) 3+* 4+  Knee extension (L3) 4  4  Knee flexion 4 4  Hip abduction 3+* 4+  Hip extension    Hip external rotation 3+*   Hip internal rotation 4+   Hip adduction    Ankle dorsiflexion (L4)    Ankle plantarflexion (S1)    Ankle inversion    Ankle eversion    Great Toe ext (L5)    Grossly     (Blank rows = not tested, score listed is out of 5 possible points.  N = WNL, D = diminished, C = clear for gross weakness with myotome testing, * = concordant pain with testing)  LE ROM:  ROM Right (Eval) Left (Eval)  Hip flexion    Hip extension    Hip abduction    Hip adduction    Hip internal rotation limited limited  Hip external rotation Slight limitation WNL  Knee extension    Knee flexion    Ankle dorsiflexion    Ankle plantarflexion    Ankle inversion    Ankle eversion     (Blank rows = not tested, N = WNL, * = concordant pain with testing)  Functional Tests  Eval    30'' STS: 12x  UE used? n                                                          SPECIAL TESTS:  Fadir, faber, scour (+) R hip   PATIENT SURVEYS:  LEFS: 45/80   TODAY'S TREATMENT: [facility] Adult PT Treatment:                                                DATE: 10/17/2023 Therapeutic Exercise: Bike - 5 min - L2 Bridge from ball - small arc - 3x5 STS - 4x5 Standing R hip abd machine #12.5  Heel raises - bil - 2x15 Knee ext machine - 5# bil - 3x5  Manual Therapy  LAD R   HOME EXERCISE PROGRAM: Access Code: [email or web address] URL: [email or web address] Date: 10/17/2023 Prepared by: Helene Gasmen  Exercises - Bridge  - 1 x daily - 4-7 x weekly - 3 sets - 5 reps - 3'' hold - Sit to Stand Without Arm Support  - 1 x daily - 4-7 x weekly - 3 sets - 5 reps - Standing Hip Abduction with Counter Support (Mirrored)  - 1 x daily - 4-7 x weekly - 2 sets - 10 reps - Supine Piriformis Stretch with Foot on Ground  - 1 x daily - 7 x weekly - 1 sets - 3 reps - 20 hold - Supine Figure 4 Piriformis Stretch  - 1 x  daily - 7 x weekly - 1 sets - 3 reps - 20 hold - Hooklying Hamstring Stretch with Strap  - 1 x daily - 7 x weekly - 1 sets - 3 reps - 20 hold - Walking on Treadmill  - 1 x daily - 4-7 x weekly - 1 reps - 5 time  Treatment priorities   Eval        Hip ER/ext/abd strength        General core and LQ strengthening         Walking program                          ASSESSMENT:  CLINICAL IMPRESSION: Lauren Chapman tolerated session well with no adverse reaction.  Concentrated on hip ext and abd strengthening w/ pt noting high levels of both local and general fatigue.  Some mild increase in lateral hip pain with hip abd which reduced with time.  Encouraged walking daily for at least 5 min.  EVAL: Lauren Chapman is a 53 y.o. female who presents to clinic with signs and sxs consistent with R hip pain.   Consistent with physician impression of degenerative changes.  Somewhat atypical referral pattern below knee, but pt with no back pain and reproduction of concordant pain with interarticular hip special tests.   Lauren Chapman will benefit from skilled PT to address relevant deficits and improve comfort with  daily tasks.   OBJECTIVE IMPAIRMENTS: Pain, R hip strength, R hip ROM, gait  ACTIVITY LIMITATIONS: transfers, sleeping, bending, squatting  PERSONAL FACTORS: See medical history and pertinent history   REHAB POTENTIAL: Good  CLINICAL DECISION MAKING: Evolving/moderate complexity  EVALUATION COMPLEXITY: Moderate   GOALS:   SHORT TERM GOALS: Target date: 10/30/2023   Lauren Chapman will be >75% HEP compliant to improve carryover between sessions and facilitate independent management of condition  Evaluation: ongoing Goal status: INITIAL   LONG TERM GOALS: Target date: 11/27/2023  Lauren Chapman will self report >/= 50% decrease in pain from evaluation to improve function in daily tasks  Evaluation/Baseline: 8/10 max pain Goal status: INITIAL   2.  Lauren Chapman will show a >/= 15 pt improvement in LEFS score (MCID is  ~11% or 9 pts) as a proxy for functional improvement   Evaluation/Baseline: 45 pts Goal status: INITIAL   3.  Lauren Chapman will start regular walking program, not limited by pain  Evaluation/Baseline: limited Goal status: INITIAL   4.  Lauren Chapman will self report significant improvement in ability to sleep  Evaluation/Baseline: Poor sleep d/t pain - requires medication Goal status: INITIAL    PLAN: PT FREQUENCY: 1-2x/week  PT DURATION: 8 weeks  PLANNED INTERVENTIONS:  [id number]- PT Re-evaluation, 97110-Therapeutic exercises, 97530- Therapeutic activity, [id number]- Neuromuscular re-education, 97535- Self Care, 02859- Manual therapy, [id number]- Gait training, [id number]- Aquatic Therapy, [phone]- Electrical stimulation (manual), [id number]- Vasopneumatic device, [id number]- Traction (mechanical), [id number]- Ionotophoresis 4mg /ml Dexamethasone , Taping, Dry Needling, Joint manipulation, and Spinal manipulation.   Lauren Chapman Kadeen Sroka PT 10/17/23 11:05 AM

## 2023-10-18 ENCOUNTER — Encounter: Admitting: Physical Therapy

## 2023-10-19 ENCOUNTER — Encounter: Admitting: Physical Therapy

## 2023-10-23 NOTE — Therapy (Signed)
 OUTPATIENT PHYSICAL THERAPY LOWER EXTREMITY TREATMENT  Patient Name: ANNALEIGH STEINMEYER MRN: [id number] DOB:1970-09-28, 53 y.o., female Today's Date: 10/23/2023      Past Medical History:  Diagnosis Date   Allergy    Anxiety    Bipolar affective psychosis (HCC)    Complication of anesthesia     Acting silly- waving at everyone-Giddy   Depression    Emphysema lung (HCC)    inhaler prn,  followed by pcp and pulmonologist-- dr frances   Family history of adverse reaction to anesthesia    Father had rheumatic fever, and died on operating table.   GERD (gastroesophageal reflux disease)    Kidney cysts 01/24/2020   Right Kidney   Migraine    Moderate persistent asthma 09/05/2021   Sleep apnea    Smokers' cough (HCC)    per pt not productive   SUI (stress urinary incontinence, female)    Tubular adenoma of colon 07/08/2020   Wears glasses    Past Surgical History:  Procedure Laterality Date   ABDOMINAL HYSTERECTOMY N/A    Phreesia 11/30/2019   bladder stimulator     CARPAL TUNNEL RELEASE Right 09-12-2007   @[facility]    and GANGLION CYST EXCISION   COLONOSCOPY     CYSTOSCOPY N/A 04/29/2019   Procedure: CYSTOSCOPY;  Surgeon: Arnaldo Purchase, MD;  Location: [facility];  Service: Gynecology;  Laterality: N/A;   DILATATION & CURETTAGE/HYSTEROSCOPY WITH MYOSURE N/A 11/13/2018   Procedure: DILATATION & CURETTAGE/HYSTEROSCOPY WITH MYOSURE;  Surgeon: Rockney Evalene SQUIBB, MD;  Location: [facility];  Service: Gynecology;  Laterality: N/A;  request 9:00am OR start time in [place] Gyn block requests one hour   DILATION AND CURETTAGE OF UTERUS  02/2019   EYE SURGERY N/A    Phreesia 11/30/2019   GANGLION CYST EXCISION     R wrist   GREAT TOE ARTHRODESIS, JONES PROCEDURE     hammer toe 2024   interstem      implant for overactive bladder   KNEE ARTHROSCOPY WITH ANTERIOR CRUCIATE LIGAMENT (ACL) REPAIR Right 10/2015   LAPAROSCOPIC CHOLECYSTECTOMY  1997    LAPAROSCOPIC HYSTERECTOMY Bilateral 04/29/2019   Procedure: HYSTERECTOMY TOTAL LAPAROSCOPIC, BILATERAL SALPINO-OOPHORECTOMY;  Surgeon: Arnaldo Purchase, MD;  Location: [facility] [place];  Service: Gynecology;  Laterality: Bilateral;   LAPAROSCOPIC TOTAL HYSTERECTOMY     TOOTH EXTRACTION  04/01/2019   tooth removal Bilateral 2022   4 teeth removed   UPPER GASTROINTESTINAL ENDOSCOPY     Patient Active Problem List   Diagnosis Date Noted   Former smoker 09/27/2023   Primary osteoarthritis of right hip 09/14/2023   Morbid obesity (HCC) 05/22/2023   Schizoaffective disorder (HCC) 03/01/2023   Asthma 02/15/2023   Intractable chronic migraine with aura with status migrainosus 02/15/2023   Overactive bladder 02/15/2023   Poorly controlled mild persistent asthma 09/05/2021   Syncope 08/02/2021   CAP (community acquired pneumonia) 07/18/2021   OSA (obstructive sleep apnea) 07/18/2021   Hypokalemia 07/18/2021   Depression 03/24/2020   Left lumbar radiculopathy 02/25/2020   Absence of bladder continence 02/25/2020   History of postmenopausal bleeding 04/29/2019   Polypharmacy 04/03/2019   Acute low back pain 07/31/2018   Emphysema, unspecified (HCC) 05/17/2018   Weakness 01/25/2017   Chronic migraine w/o aura w/o status migrainosus, not intractable 12/22/2014   Encounter for other general counseling or advice on contraception 09/02/2014   Bipolar disorder (HCC) 10/12/2011   Anxiety 10/12/2011   GERD (gastroesophageal reflux disease) 10/12/2011    PCP:  Levora Reyes SAUNDERS, MD  REFERRING PROVIDER: Levora Reyes SAUNDERS, MD  THERAPY DIAG:  No diagnosis found.  REFERRING DIAG: R hip pain  Rationale for Evaluation and Treatment:  Rehabilitation  SUBJECTIVE:  PERTINENT PAST HISTORY:  Bipolar, anxiety, emphysema, depression, schizoaffective disorder, migraines, R ACL surgery         PRECAUTIONS: None  WEIGHT BEARING RESTRICTIONS No  FALLS:  Has patient fallen in last 6  months? No, Number of falls: 0  MOI/History of condition:  Onset date: 6 months +  SUBJECTIVE STATEMENT Pt reports that she has been completing her exercises regularly but sees no benefit.  She continues to have pain at night.  Not sure if the exercises are making it worse or not.  She has some pain when actually completing the exercises but this is improving somewhat.  States this is her concordant pain.  EVAL: Pt is a 53 y.o. female who presents to clinic with chief complaint of R hip and R LE pain which started about 6 months ago.  No significant change in activity 6 months ago.  Slow onset.  Imaging largely unremarkable.  Worst when she lays down. Describes a constant ache.  Denies n/t in R LE.  Muscle relaxer is not helpful.  Excedrin helps with pain.  Feels origin is hip but can radiate all the way to ankle.  Worst first thing in the morning and gets better with movement.  Denies back pain.   Red flags:  denies   Pain:  Are you having pain? Yes Pain location: R hip pain, R LE pain NPRS scale:  Best: 2/10, Worst: 8/10 Aggravating factors: laying down, laying on R side Relieving factors: movement, Excedrin Pain description: aching  Occupation: NA  Assistive Device: NA  Hand Dominance: NA  Patient Goals/Specific Activities: reduce pain   OBJECTIVE:   GENERAL OBSERVATION/GAIT: Slight antalgic gait R  SENSATION: Light touch: Appears intact   LE MMT:  MMT Right (Eval) Left (Eval)  Hip flexion (L2, L3) 3+* 4+  Knee extension (L3) 4 4  Knee flexion 4 4  Hip abduction 3+* 4+  Hip extension    Hip external rotation 3+*   Hip internal rotation 4+   Hip adduction    Ankle dorsiflexion (L4)    Ankle plantarflexion (S1)    Ankle inversion    Ankle eversion    Great Toe ext (L5)    Grossly     (Blank rows = not tested, score listed is out of 5 possible points.  N = WNL, D = diminished, C = clear for gross weakness with myotome testing, * = concordant pain with  testing)  LE ROM:  ROM Right (Eval) Left (Eval)  Hip flexion    Hip extension    Hip abduction    Hip adduction    Hip internal rotation limited limited  Hip external rotation Slight limitation WNL  Knee extension    Knee flexion    Ankle dorsiflexion    Ankle plantarflexion    Ankle inversion    Ankle eversion     (Blank rows = not tested, N = WNL, * = concordant pain with testing)  Functional Tests  Eval    30'' STS: 12x  UE used? n  SPECIAL TESTS:  Fadir, faber, scour (+) R hip   PATIENT SURVEYS:  LEFS: 45/80   TODAY'S TREATMENT: [facility] Adult PT Treatment:                                                DATE: 10/24/23 Therapeutic Exercise: Bike - 5 min - L2 Bridge from ball - small arc - 3x5 STS - 4x5 Standing R hip abd machine #12.5  Heel raises - bil - 2x15 Knee ext machine - 5# bil - 3x5  Manual Therapy  LAD R Therapeutic Exercise: *** Manual Therapy: *** Neuromuscular re-ed: *** Therapeutic Activity: *** Modalities: *** Self Care: *** Therapeutic Exercise: Supine Hip Adduction Isometric with Ball x10 10 Hook lying Isometric Clamshell x10 5 Bridge x10 5'' Piriformis stretch x3 20 Figure 4  x3 20 Hamstring stretch x3 20 S/L hip abd x5, stopped with pt reporting a higher level of pain Quadruped with fwd/bwd weight shifting of within pain tolerance  STS hinged hip x10, pt experienced shortness of breath Standing R hip abd x10    [facility] Adult PT Treatment:                                                DATE: 10/23/2023 Therapeutic Exercise: Bike - 5 min - L2 Bridge from ball - small arc - 3x5 STS - 4x5 Standing R hip abd machine #12.5  Heel raises - bil - 2x15 Knee ext machine - 5# bil - 3x5  Manual Therapy  LAD R   HOME EXERCISE PROGRAM: Access Code: [email or web address] URL: [email or web address] Date: 10/17/2023 Prepared by: Helene Gasmen  Exercises - Bridge  - 1 x daily - 4-7 x weekly - 3 sets - 5 reps - 3'' hold - Sit to Stand Without Arm Support  - 1 x daily - 4-7 x weekly - 3 sets - 5 reps - Standing Hip Abduction with Counter Support (Mirrored)  - 1 x daily - 4-7 x weekly - 2 sets - 10 reps - Supine Piriformis Stretch with Foot on Ground  - 1 x daily - 7 x weekly - 1 sets - 3 reps - 20 hold - Supine Figure 4 Piriformis Stretch  - 1 x daily - 7 x weekly - 1 sets - 3 reps - 20 hold - Hooklying Hamstring Stretch with Strap  - 1 x daily - 7 x weekly - 1 sets - 3 reps - 20 hold - Walking on Treadmill  - 1 x daily - 4-7 x weekly - 1 reps - 5 time  Treatment priorities   Eval        Hip ER/ext/abd strength        General core and LQ strengthening         Walking program                          ASSESSMENT:  CLINICAL IMPRESSION: Klohe tolerated session well with no adverse reaction.  Concentrated on hip ext and abd strengthening w/ pt noting high levels of both local and general fatigue.  Some mild increase in lateral hip pain with hip abd which reduced with time.  Encouraged walking daily for at least  5 min.  EVAL: Lillien is a 53 y.o. female who presents to clinic with signs and sxs consistent with R hip pain.   Consistent with physician impression of degenerative changes.  Somewhat atypical referral pattern below knee, but pt with no back pain and reproduction of concordant pain with interarticular hip special tests.   Honey will benefit from skilled PT to address relevant deficits and improve comfort with daily tasks.   OBJECTIVE IMPAIRMENTS: Pain, R hip strength, R hip ROM, gait  ACTIVITY LIMITATIONS: transfers, sleeping, bending, squatting  PERSONAL FACTORS: See medical history and pertinent history   REHAB POTENTIAL: Good  CLINICAL DECISION MAKING: Evolving/moderate complexity  EVALUATION COMPLEXITY: Moderate   GOALS:   SHORT TERM GOALS: Target date: 10/30/2023   Conna will be >75% HEP  compliant to improve carryover between sessions and facilitate independent management of condition  Evaluation: ongoing Goal status: INITIAL   LONG TERM GOALS: Target date: 11/27/2023  Devani will self report >/= 50% decrease in pain from evaluation to improve function in daily tasks  Evaluation/Baseline: 8/10 max pain Goal status: INITIAL   2.  Kallyn will show a >/= 15 pt improvement in LEFS score (MCID is ~11% or 9 pts) as a proxy for functional improvement   Evaluation/Baseline: 45 pts Goal status: INITIAL   3.  Aeralyn will start regular walking program, not limited by pain  Evaluation/Baseline: limited Goal status: INITIAL   4.  Shebra will self report significant improvement in ability to sleep  Evaluation/Baseline: Poor sleep d/t pain - requires medication Goal status: INITIAL    PLAN: PT FREQUENCY: 1-2x/week  PT DURATION: 8 weeks  PLANNED INTERVENTIONS:  97164- PT Re-evaluation, 97110-Therapeutic exercises, 97530- Therapeutic activity, [id number]- Neuromuscular re-education, 97535- Self Care, 02859- Manual therapy, [id number]- Gait training, [id number]- Aquatic Therapy, [phone]- Electrical stimulation (manual), [id number]- Vasopneumatic device, [id number]- Traction (mechanical), [id number]- Ionotophoresis 4mg /ml Dexamethasone , Taping, Dry Needling, Joint manipulation, and Spinal manipulation.   Zakai Gonyea PT 10/23/23 9:33 PM

## 2023-10-24 ENCOUNTER — Ambulatory Visit: Attending: Orthopaedic Surgery

## 2023-10-24 ENCOUNTER — Encounter: Payer: Self-pay | Admitting: Neurology

## 2023-10-24 DIAGNOSIS — M6281 Muscle weakness (generalized): Secondary | ICD-10-CM | POA: Insufficient documentation

## 2023-10-24 DIAGNOSIS — M25551 Pain in right hip: Secondary | ICD-10-CM | POA: Insufficient documentation

## 2023-10-25 ENCOUNTER — Telehealth (INDEPENDENT_AMBULATORY_CARE_PROVIDER_SITE_OTHER): Admitting: Family Medicine

## 2023-10-25 ENCOUNTER — Encounter: Payer: Self-pay | Admitting: Family Medicine

## 2023-10-25 DIAGNOSIS — R259 Unspecified abnormal involuntary movements: Secondary | ICD-10-CM | POA: Diagnosis not present

## 2023-10-25 NOTE — Progress Notes (Signed)
 Virtual Visit via Video Note  I connected with Lauren Chapman on 10/25/23 at 4:59 PM by a video enabled telemedicine application and verified that I am speaking with the correct person using two identifiers.  Patient location: home, by self.  My location: office - [place].    I discussed the limitations, risks, security and privacy concerns of performing an evaluation and management service by telephone and the availability of in person appointments. I also discussed with the patient that there may be a patient responsible charge related to this service. The patient expressed understanding and agreed to proceed, consent obtained  Chief complaint:  Chief Complaint  Patient presents with   Acute Visit    Uncontrollable shaking all over my arms and legs. Sx started 6 months ago.     History of Present Illness: Lauren Chapman is a 53 y.o. female  Arm/leg shaking Noticed a tremor in hands, head, and legs. Notices all the time, more noticeable  in legs when sitting down. More at rest than with activity but notices with activity as well.  Present for past 6 months to a year, but getting worse. Physical therapist noted the other day. Has not discussed with providers prior or with her psychiatrist - [facility] - May Jenkins Batter.  No history of seizure disorder.  She does take thiothixene - since January.     Patient Active Problem List   Diagnosis Date Noted   Former smoker 09/27/2023   Primary osteoarthritis of right hip 09/14/2023   Morbid obesity (HCC) 05/22/2023   Schizoaffective disorder (HCC) 03/01/2023   Asthma 02/15/2023   Intractable chronic migraine with aura with status migrainosus 02/15/2023   Overactive bladder 02/15/2023   Poorly controlled mild persistent asthma 09/05/2021   Syncope 08/02/2021   CAP (community acquired pneumonia) 07/18/2021   OSA (obstructive sleep apnea) 07/18/2021   Hypokalemia 07/18/2021   Depression 03/24/2020   Left lumbar  radiculopathy 02/25/2020   Absence of bladder continence 02/25/2020   History of postmenopausal bleeding 04/29/2019   Polypharmacy 04/03/2019   Acute low back pain 07/31/2018   Emphysema, unspecified (HCC) 05/17/2018   Weakness 01/25/2017   Chronic migraine w/o aura w/o status migrainosus, not intractable 12/22/2014   Encounter for other general counseling or advice on contraception 09/02/2014   Bipolar disorder (HCC) 10/12/2011   Anxiety 10/12/2011   GERD (gastroesophageal reflux disease) 10/12/2011   Past Medical History:  Diagnosis Date   Allergy    Anxiety    Bipolar affective psychosis (HCC)    Complication of anesthesia     Acting silly- waving at everyone-Giddy   Depression    Emphysema lung (HCC)    inhaler prn,  followed by pcp and pulmonologist-- dr frances   Family history of adverse reaction to anesthesia    Father had rheumatic fever, and died on operating table.   GERD (gastroesophageal reflux disease)    Kidney cysts 01/24/2020   Right Kidney   Migraine    Moderate persistent asthma 09/05/2021   Sleep apnea    Smokers' cough (HCC)    per pt not productive   SUI (stress urinary incontinence, female)    Tubular adenoma of colon 07/08/2020   Wears glasses    Past Surgical History:  Procedure Laterality Date   ABDOMINAL HYSTERECTOMY N/A    Phreesia 11/30/2019   bladder stimulator     CARPAL TUNNEL RELEASE Right 09-12-2007   @[facility]    and GANGLION CYST EXCISION   COLONOSCOPY  CYSTOSCOPY N/A 04/29/2019   Procedure: CYSTOSCOPY;  Surgeon: Arnaldo Purchase, MD;  Location: [facility];  Service: Gynecology;  Laterality: N/A;   DILATATION & CURETTAGE/HYSTEROSCOPY WITH MYOSURE N/A 11/13/2018   Procedure: DILATATION & CURETTAGE/HYSTEROSCOPY WITH MYOSURE;  Surgeon: Rockney Evalene SQUIBB, MD;  Location: [facility];  Service: Gynecology;  Laterality: N/A;  request 9:00am OR start time in [place] Gyn block requests one hour    DILATION AND CURETTAGE OF UTERUS  02/2019   EYE SURGERY N/A    Phreesia 11/30/2019   GANGLION CYST EXCISION     R wrist   GREAT TOE ARTHRODESIS, JONES PROCEDURE     hammer toe 2024   interstem      implant for overactive bladder   KNEE ARTHROSCOPY WITH ANTERIOR CRUCIATE LIGAMENT (ACL) REPAIR Right 10/2015   LAPAROSCOPIC CHOLECYSTECTOMY  1997   LAPAROSCOPIC HYSTERECTOMY Bilateral 04/29/2019   Procedure: HYSTERECTOMY TOTAL LAPAROSCOPIC, BILATERAL SALPINO-OOPHORECTOMY;  Surgeon: Arnaldo Purchase, MD;  Location: [facility];  Service: Gynecology;  Laterality: Bilateral;   LAPAROSCOPIC TOTAL HYSTERECTOMY     TOOTH EXTRACTION  04/01/2019   tooth removal Bilateral 2022   4 teeth removed   UPPER GASTROINTESTINAL ENDOSCOPY     Allergies  Allergen Reactions   Codeine Anaphylaxis and Other (See Comments)    Cannot have ANYTHING with codeine   Prior to Admission medications   Medication Sig Start Date End Date Taking? Authorizing Provider  albuterol  (VENTOLIN  HFA) 108 (90 Base) MCG/ACT inhaler Inhale 2 puffs into the lungs every 4 (four) hours as needed for wheezing or shortness of breath. 07/18/22  Yes Cobb, Comer GAILS, NP  budesonide -formoterol  (SYMBICORT ) 160-4.5 MCG/ACT inhaler Inhale 2 puffs into the lungs 2 (two) times daily. 09/27/23  Yes Cobb, Comer GAILS, NP  busPIRone  (BUSPAR ) 15 MG tablet Take 1 tablet (15 mg total) by mouth 3 (three) times daily. 03/09/23  Yes Ezzard Staci SAILOR, NP  citalopram  (CELEXA ) 40 MG tablet Take 1 tablet (40 mg total) by mouth daily. 04/16/23 10/25/23 Yes Lynnette Barter, MD  EPINEPHrine  0.3 mg/0.3 mL IJ SOAJ injection Inject 0.3 mg into the muscle as needed for anaphylaxis. 03/29/21  Yes Ruthell Lonni FALCON, PA-C  esomeprazole  (NEXIUM ) 20 MG capsule Take 1 capsule (20 mg total) by mouth 2 (two) times daily before a meal. Patient taking differently: Take 20 mg by mouth daily at 12 noon. 03/21/19  Yes Melonie Colonel, Mikel HERO, MD  gabapentin  (NEURONTIN ) 400 MG  capsule Take 2 capsules (800 mg total) by mouth 3 (three) times daily. 04/02/23  Yes Lynnette Barter, MD  metoprolol tartrate (LOPRESSOR) 25 MG tablet Take 25 mg by mouth 2 (two) times daily.   Yes [provider]  MYRBETRIQ  50 MG TB24 tablet Take 50 mg by mouth daily. 11/11/21  Yes [provider]  ondansetron  (ZOFRAN ) 4 MG tablet Take 1 tablet (4 mg total) by mouth every 8 (eight) hours as needed for nausea or vomiting. 01/05/20  Yes Gayland Lauraine PARAS, NP  ramelteon (ROZEREM) 8 MG tablet Take 8 mg by mouth at bedtime. 08/14/23  Yes [provider]  Rimegepant Sulfate (NURTEC) 75 MG TBDP Take 1 tablet (75 mg total) by mouth daily as needed. 09/18/22  Yes Skeet, Adam R, DO  thiothixene (NAVANE) 10 MG capsule Take 20 mg by mouth at bedtime. 04/06/23  Yes [provider]  tiZANidine  (ZANAFLEX ) 4 MG tablet Take 1 tablet (4 mg total) by mouth every 6 (six) hours as needed for muscle spasms. 04/19/23  Yes Jerrell Cleatus Ned, MD  DEPAKOTE 125 MG DR tablet Take 125 mg by mouth 3 (three) times daily. 06/13/23   [provider]  SUMAtriptan  (IMITREX ) 6 MG/0.5ML SOLN injection Inject 0.5 mLs (6 mg total) into the skin See admin instructions. INJECT 6 MG (0.5 ml) INTO THE SKIN AS NEEDED FOR MIGRAINE OR HEADACHE. MAY REPEAT ONCE IN 2 HOURS IF HEADACHE PERSISTS OR RECURS. MAX 2 DOSES IN 24 HOURS. Patient not taking: Reported on 10/25/2023 02/17/22   Skeet Juliene SAUNDERS, DO   Social History   Socioeconomic History   Marital status: Divorced    Spouse name: n/a   Number of children: 2   Years of education: 18   Highest education level: Master's degree (e.g., MA, MS, MEng, MEd, MSW, MBA)  Occupational History   Occupation: Disabled    Comment: Formerly a Runner, broadcasting/film/video.  Tobacco Use   Smoking status: Former    Current packs/day: 0.00    Average packs/day: 1 pack/day for 39.8 years (39.8 ttl pk-yrs)    Types: Cigarettes    Start date: 10/10/1981    Quit date: 07/18/2021    Years since  quitting: 2.2    Passive exposure: Never   Smokeless tobacco: Never  Vaping Use   Vaping status: Never Used  Substance and Sexual Activity   Alcohol use: Never    Alcohol/week: 0.0 standard drinks of alcohol   Drug use: Never   Sexual activity: Not Currently    Partners: Male    Birth control/protection: Post-menopausal    Comment: menarche 53yo, 1st intercourse 53 yo-More than 5 partners  Other Topics Concern   Not on file  Social History Narrative   Lives at home with 1 of her two daughters   Right-handed.   2-4 cups caffeine daily.   Disabled    One story home   Social Drivers of Health   Financial Resource Strain: Low Risk  (08/29/2023)   Overall Financial Resource Strain (CARDIA)    Difficulty of Paying Living Expenses: Not very hard  Food Insecurity: Food Insecurity Present (08/29/2023)   Hunger Vital Sign    Worried About Running Out of Food in the Last Year: Sometimes true    Ran Out of Food in the Last Year: Sometimes true  Transportation Needs: No Transportation Needs (08/29/2023)   PRAPARE - Administrator, Civil Service (Medical): No    Lack of Transportation (Non-Medical): No  Physical Activity: Inactive (08/29/2023)   Exercise Vital Sign    Days of Exercise per Week: 0 days    Minutes of Exercise per Session: Not on file  Stress: Stress Concern Present (08/29/2023)   [employer] of Occupational Health - Occupational Stress Questionnaire    Feeling of Stress: Very much  Social Connections: Socially Isolated (08/29/2023)   Social Connection and Isolation Panel    Frequency of Communication with Friends and Family: More than three times a week    Frequency of Social Gatherings with Friends and Family: Once a week    Attends Religious Services: Never    Database administrator or Organizations: No    Attends Engineer, structural: Not on file    Marital Status: Divorced  Intimate Partner Violence: Not At Risk (03/02/2023)   Humiliation, Afraid,  Rape, and Kick questionnaire    Fear of Current or Ex-Partner: No    Emotionally Abused: No    Physically Abused: No    Sexually Abused: No    Observations/Objective: There were  no vitals filed for this visit. Nontoxic appearance on video, no visible tremor but distant view through video screen.  No abnormal facial movements noted.  Speaking in full sentences, calm, does not appear to be responding to internal stimuli.  Assessment and Plan: Abnormal involuntary movement Present for past year, reports increasing symptoms.  Tremor type movements in arms and legs, noted at rest but with intention as well.  With these abnormal involuntary movements I am suspicious for extrapyramidal side effects, dyskinesia with her antipsychotic.  Less likely essential or intention tremor, or other movement disorder.  We did discuss the option to meet with neurology but it may be best to discuss with her psychiatrist first including if any medications indicated if they do think that these are secondary to her current meds.  She plans to call her psychiatrist tomorrow but advised to let me know if referral to neuro needed or if I can assist further.  All questions were answered.  Follow Up Instructions: Patient Instructions  Thank you for scheduling an appointment today to discuss your symptoms.  As we discussed I am suspicious for possible side effects with your medications as a cause of the movements.  There are some different treatments available, but those may be best discussed with your psychiatrist.  If they do not think that these movements are due to your medicines, please let me know and I am happy to refer you to a neurologist for further evaluation.  Again I think it would be worth discussing meds with psychiatry first but let me know how you would like to proceed.  Hang in there!      I discussed the assessment and treatment plan with the patient. The patient was provided an opportunity to ask questions  and all were answered. The patient agreed with the plan and demonstrated an understanding of the instructions.   The patient was advised to call back or seek an in-person evaluation if the symptoms worsen or if the condition fails to improve as anticipated.   Reyes JONELLE Pines, MD

## 2023-10-25 NOTE — Patient Instructions (Addendum)
 Thank you for scheduling an appointment today to discuss your symptoms.  As we discussed I am suspicious for possible side effects with your medications as a cause of the movements.  There are some different treatments available, but those may be best discussed with your psychiatrist.  If they do not think that these movements are due to your medicines, please let me know and I am happy to refer you to a neurologist for further evaluation.  Again I think it would be worth discussing meds with psychiatry first but let me know how you would like to proceed.  Hang in there!

## 2023-10-26 ENCOUNTER — Encounter: Payer: Self-pay | Admitting: Physical Therapy

## 2023-10-26 ENCOUNTER — Ambulatory Visit: Admitting: Physical Therapy

## 2023-10-26 DIAGNOSIS — M25551 Pain in right hip: Secondary | ICD-10-CM | POA: Diagnosis not present

## 2023-10-26 DIAGNOSIS — M6281 Muscle weakness (generalized): Secondary | ICD-10-CM

## 2023-10-26 NOTE — Therapy (Signed)
 OUTPATIENT PHYSICAL THERAPY LOWER EXTREMITY TREATMENT  Patient Name: Lauren Chapman MRN: [id number] DOB:Jan 04, 1971, 53 y.o., Lauren Today's Date: 10/26/2023       Past Medical History:  Diagnosis Date   Allergy    Anxiety    Bipolar affective psychosis (HCC)    Complication of anesthesia     Acting silly- waving at everyone-Giddy   Depression    Emphysema lung (HCC)    inhaler prn,  followed by pcp and pulmonologist-- dr frances   Family history of adverse reaction to anesthesia    Father had rheumatic fever, and died on operating table.   GERD (gastroesophageal reflux disease)    Kidney cysts 01/24/2020   Right Kidney   Migraine    Moderate persistent asthma 09/05/2021   Sleep apnea    Smokers' cough (HCC)    per pt not productive   SUI (stress urinary incontinence, Lauren)    Tubular adenoma of colon 07/08/2020   Wears glasses    Past Surgical History:  Procedure Laterality Date   ABDOMINAL HYSTERECTOMY N/A    Phreesia 11/30/2019   bladder stimulator     CARPAL TUNNEL RELEASE Right 09-12-2007   @[facility]    and GANGLION CYST EXCISION   COLONOSCOPY     CYSTOSCOPY N/A 04/29/2019   Procedure: CYSTOSCOPY;  Surgeon: Arnaldo Purchase, MD;  Location: [facility];  Service: Gynecology;  Laterality: N/A;   DILATATION & CURETTAGE/HYSTEROSCOPY WITH MYOSURE N/A 11/13/2018   Procedure: DILATATION & CURETTAGE/HYSTEROSCOPY WITH MYOSURE;  Surgeon: Rockney Evalene SQUIBB, MD;  Location: [facility];  Service: Gynecology;  Laterality: N/A;  request 9:00am OR start time in [place] Gyn block requests one hour   DILATION AND CURETTAGE OF UTERUS  02/2019   EYE SURGERY N/A    Phreesia 11/30/2019   GANGLION CYST EXCISION     R wrist   GREAT TOE ARTHRODESIS, JONES PROCEDURE     hammer toe 2024   interstem      implant for overactive bladder   KNEE ARTHROSCOPY WITH ANTERIOR CRUCIATE LIGAMENT (ACL) REPAIR Right 10/2015   LAPAROSCOPIC CHOLECYSTECTOMY  1997    LAPAROSCOPIC HYSTERECTOMY Bilateral 04/29/2019   Procedure: HYSTERECTOMY TOTAL LAPAROSCOPIC, BILATERAL SALPINO-OOPHORECTOMY;  Surgeon: Arnaldo Purchase, MD;  Location: [facility] ;  Service: Gynecology;  Laterality: Bilateral;   LAPAROSCOPIC TOTAL HYSTERECTOMY     TOOTH EXTRACTION  04/01/2019   tooth removal Bilateral 2022   4 teeth removed   UPPER GASTROINTESTINAL ENDOSCOPY     Patient Active Problem List   Diagnosis Date Noted   Former smoker 09/27/2023   Primary osteoarthritis of right hip 09/14/2023   Morbid obesity (HCC) 05/22/2023   Schizoaffective disorder (HCC) 03/01/2023   Asthma 02/15/2023   Intractable chronic migraine with aura with status migrainosus 02/15/2023   Overactive bladder 02/15/2023   Poorly controlled mild persistent asthma 09/05/2021   Syncope 08/02/2021   CAP (community acquired pneumonia) 07/18/2021   OSA (obstructive sleep apnea) 07/18/2021   Hypokalemia 07/18/2021   Depression 03/24/2020   Left lumbar radiculopathy 02/25/2020   Absence of bladder continence 02/25/2020   History of postmenopausal bleeding 04/29/2019   Polypharmacy 04/03/2019   Acute low back pain 07/31/2018   Emphysema, unspecified (HCC) 05/17/2018   Weakness 01/25/2017   Chronic migraine w/o aura w/o status migrainosus, not intractable 12/22/2014   Encounter for other general counseling or advice on contraception 09/02/2014   Bipolar disorder (HCC) 10/12/2011   Anxiety 10/12/2011   GERD (gastroesophageal reflux disease) 10/12/2011  PCP: Lauren Reyes SAUNDERS, MD  REFERRING PROVIDER: Levora Reyes SAUNDERS, MD  THERAPY DIAG:  No diagnosis found.  REFERRING DIAG: R hip pain  Rationale for Evaluation and Treatment:  Rehabilitation  SUBJECTIVE:  PERTINENT PAST HISTORY:  Bipolar, anxiety, emphysema, depression, schizoaffective disorder, migraines, R ACL surgery         PRECAUTIONS: None  WEIGHT BEARING RESTRICTIONS No  FALLS:  Has patient fallen in last 6  months? No, Number of falls: 0  MOI/History of condition:  Onset date: 6 months +  SUBJECTIVE STATEMENT  A little bit sore today in the right hip crease, HEP going well, but not able to walk consistently. It's taking everything within me just to keep doing some of the exercises  EVAL: Pt is a 53 y.o. Lauren who presents to clinic with chief complaint of R hip and R LE pain which started about 6 months ago.  No significant change in activity 6 months ago.  Slow onset.  Imaging largely unremarkable.  Worst when she lays down. Describes a constant ache.  Denies n/t in R LE.  Muscle relaxer is not helpful.  Excedrin helps with pain.  Feels origin is hip but can radiate all the way to ankle.  Worst first thing in the morning and gets better with movement.  Denies back pain.   Red flags:  denies   Pain:  Are you having pain? Yes 10/26/2023 2/10  Pain location: R hip pain, R LE pain NPRS scale:  Best: 2/10, Worst: 8/10 Aggravating factors: laying down, laying on R side Relieving factors: movement, Excedrin Pain description: aching  Occupation: NA  Assistive Device: NA  Hand Dominance: NA  Patient Goals/Specific Activities: reduce pain   OBJECTIVE:   GENERAL OBSERVATION/GAIT:  Slight antalgic gait R  SENSATION: Light touch: Appears intact   LE MMT:  MMT Right (Eval) Left (Eval)  Hip flexion (L2, L3) 3+* 4+  Knee extension (L3) 4 4  Knee flexion 4 4  Hip abduction 3+* 4+  Hip extension    Hip external rotation 3+*   Hip internal rotation 4+   Hip adduction    Ankle dorsiflexion (L4)    Ankle plantarflexion (S1)    Ankle inversion    Ankle eversion    Great Toe ext (L5)    Grossly     (Blank rows = not tested, score listed is out of 5 possible points.  N = WNL, D = diminished, C = clear for gross weakness with myotome testing, * = concordant pain with testing)  LE ROM:  ROM Right (Eval) Left (Eval)  Hip flexion    Hip extension    Hip abduction    Hip  adduction    Hip internal rotation limited limited  Hip external rotation Slight limitation WNL  Knee extension    Knee flexion    Ankle dorsiflexion    Ankle plantarflexion    Ankle inversion    Ankle eversion     (Blank rows = not tested, N = WNL, * = concordant pain with testing)  Functional Tests  Eval    30'' STS: 12x  UE used? n                                                          SPECIAL TESTS:  Fadir,  faber, scour (+) R hip   PATIENT SURVEYS:  LEFS: 45/80   TODAY'S TREATMENT:  [facility] Adult PT Treatment:                                                DATE: 10/26/23 TA Supine clamshell red TB 2x8x3s  Glute bridge 2x6x3s STS 2x8 Education of pt on HEP  TE Hip marches red TB x8x3s (p! 5th rep), x4x3s Hamstring curl 10# 2x8x3s     [facility] Adult PT Treatment:                                                DATE: 10/24/23 Therapeutic Exercise: Nustep 6 min L5 UE/LE Bridging 2x10 H/L hip add ball squeeze x15 5' H/L clams RTB x12 5 LAQ 2x10 5 STS - 5 x3 TKE c ball on wall x15 3 Standing hip abd 5# Manual Therapy LAD R Self Care: Instructed pt in eccentric control with exs /HEP and it's importance with strengthening Instructed pt in and she returned demonstration with hip A/P pemdulums, l E on 4  step and R LE wearing a 5 cuff weight Recommendation for the purchase of adjustable cuff weights for home use   [facility] Adult PT Treatment:                                                DATE: 10/26/2023 Therapeutic Exercise: Bike - 5 min - L2 Bridge from ball - small arc - 3x5 STS - 4x5 Standing R hip abd machine #12.5  Heel raises - bil - 2x15 Knee ext machine - 5# bil - 3x5  Manual Therapy  LAD R   HOME EXERCISE PROGRAM: Access Code: [email or web address] URL: [email or web address] Date: 10/17/2023 Prepared by: Lauren Chapman  Exercises - Bridge  - 1 x daily - 4-7 x weekly - 3 sets - 5 reps - 3'' hold - Sit to Stand Without Arm  Support  - 1 x daily - 4-7 x weekly - 3 sets - 5 reps - Standing Hip Abduction with Counter Support (Mirrored)  - 1 x daily - 4-7 x weekly - 2 sets - 10 reps - Supine Piriformis Stretch with Foot on Ground  - 1 x daily - 7 x weekly - 1 sets - 3 reps - 20 hold - Supine Figure 4 Piriformis Stretch  - 1 x daily - 7 x weekly - 1 sets - 3 reps - 20 hold - Hooklying Hamstring Stretch with Strap  - 1 x daily - 7 x weekly - 1 sets - 3 reps - 20 hold - Walking on Treadmill  - 1 x daily - 4-7 x weekly - 1 reps - 5 time  Treatment priorities   Eval        Hip ER/ext/abd strength        General core and LQ strengthening         Walking program                          ASSESSMENT:  CLINICAL IMPRESSION: Patient tolerated  treatment well in terms of functional hip/BL LE strengthening with no significant increases in pain. Pt educated on importance of HEP programming/consistency and listening to body when exercising. Pt will continue to benefit from skilled PT to improve function lower body strength and activity tolerance.     EVAL: Lauren Chapman is a 53 y.o. Lauren who presents to clinic with signs and sxs consistent with R hip pain.   Consistent with physician impression of degenerative changes.  Somewhat atypical referral pattern below knee, but pt with no back pain and reproduction of concordant pain with interarticular hip special tests.   Lauren Chapman will benefit from skilled PT to address relevant deficits and improve comfort with daily tasks.   OBJECTIVE IMPAIRMENTS: Pain, R hip strength, R hip ROM, gait  ACTIVITY LIMITATIONS: transfers, sleeping, bending, squatting  PERSONAL FACTORS: See medical history and pertinent history   REHAB POTENTIAL: Good  CLINICAL DECISION MAKING: Evolving/moderate complexity  EVALUATION COMPLEXITY: Moderate   GOALS:   SHORT TERM GOALS: Target date: 10/30/2023   Lauren Chapman will be >75% HEP compliant to improve carryover between sessions and facilitate independent  management of condition  Evaluation: ongoing Goal status: INITIAL   LONG TERM GOALS: Target date: 11/27/2023  Lauren Chapman will self report >/= 50% decrease in pain from evaluation to improve function in daily tasks  Evaluation/Baseline: 8/10 max pain Goal status: INITIAL   2.  Lauren Chapman will show a >/= 15 pt improvement in LEFS score (MCID is ~11% or 9 pts) as a proxy for functional improvement   Evaluation/Baseline: 45 pts Goal status: INITIAL   3.  Lauren Chapman will start regular walking program, not limited by pain  Evaluation/Baseline: limited Goal status: INITIAL   4.  Lauren Chapman will self report significant improvement in ability to sleep  Evaluation/Baseline: Poor sleep d/t pain - requires medication Goal status: INITIAL    PLAN: PT FREQUENCY: 1-2x/week  PT DURATION: 8 weeks  PLANNED INTERVENTIONS:  [id number]- PT Re-evaluation, 97110-Therapeutic exercises, 97530- Therapeutic activity, [id number]- Neuromuscular re-education, 97535- Self Care, [id number]- Manual therapy, [id number]- Gait training, [phone]- Aquatic Therapy, [phone]- Electrical stimulation (manual), [id number]- Vasopneumatic device, [id number]- Traction (mechanical), [id number]- Ionotophoresis 4mg /ml Dexamethasone , Taping, Dry Needling, Joint manipulation, and Spinal manipulation.   Lauren Chapman  PT, DPT

## 2023-10-31 ENCOUNTER — Ambulatory Visit: Admitting: Physical Therapy

## 2023-10-31 ENCOUNTER — Encounter: Payer: Self-pay | Admitting: Physical Therapy

## 2023-10-31 DIAGNOSIS — M6281 Muscle weakness (generalized): Secondary | ICD-10-CM

## 2023-10-31 DIAGNOSIS — M25551 Pain in right hip: Secondary | ICD-10-CM | POA: Diagnosis not present

## 2023-10-31 NOTE — Therapy (Signed)
 OUTPATIENT PHYSICAL THERAPY LOWER EXTREMITY TREATMENT  Patient Name: Lauren Chapman MRN: [id number] DOB:1970-05-19, 53 y.o., female Today's Date: 10/31/2023   PT End of Session - 10/31/23 1016     Visit Number 6    Number of Visits --   1-2x per wk   Date for Recertification  11/27/23    Authorization Type [employer] duel complete    Progress Note Due on Visit 10    PT Start Time 1015    PT Stop Time 1056    PT Time Calculation (min) 41 min    Activity Tolerance Patient tolerated treatment well;Patient limited by pain    Behavior During Therapy WFL for tasks assessed/performed             Past Medical History:  Diagnosis Date   Allergy    Anxiety    Bipolar affective psychosis (HCC)    Complication of anesthesia     Acting silly- waving at everyone-Giddy   Depression    Emphysema lung (HCC)    inhaler prn,  followed by pcp and pulmonologist-- dr frances   Family history of adverse reaction to anesthesia    Father had rheumatic fever, and died on operating table.   GERD (gastroesophageal reflux disease)    Kidney cysts 01/24/2020   Right Kidney   Migraine    Moderate persistent asthma 09/05/2021   Sleep apnea    Smokers' cough (HCC)    per pt not productive   SUI (stress urinary incontinence, female)    Tubular adenoma of colon 07/08/2020   Wears glasses    Past Surgical History:  Procedure Laterality Date   ABDOMINAL HYSTERECTOMY N/A    Phreesia 11/30/2019   bladder stimulator     CARPAL TUNNEL RELEASE Right 09-12-2007   @[facility]    and GANGLION CYST EXCISION   COLONOSCOPY     CYSTOSCOPY N/A 04/29/2019   Procedure: CYSTOSCOPY;  Surgeon: Arnaldo Purchase, MD;  Location: [facility];  Service: Gynecology;  Laterality: N/A;   DILATATION & CURETTAGE/HYSTEROSCOPY WITH MYOSURE N/A 11/13/2018   Procedure: DILATATION & CURETTAGE/HYSTEROSCOPY WITH MYOSURE;  Surgeon: Rockney Evalene SQUIBB, MD;  Location: [facility];  Service: Gynecology;   Laterality: N/A;  request 9:00am OR start time in [place] Gyn block requests one hour   DILATION AND CURETTAGE OF UTERUS  02/2019   EYE SURGERY N/A    Phreesia 11/30/2019   GANGLION CYST EXCISION     R wrist   GREAT TOE ARTHRODESIS, JONES PROCEDURE     hammer toe 2024   interstem      implant for overactive bladder   KNEE ARTHROSCOPY WITH ANTERIOR CRUCIATE LIGAMENT (ACL) REPAIR Right 10/2015   LAPAROSCOPIC CHOLECYSTECTOMY  1997   LAPAROSCOPIC HYSTERECTOMY Bilateral 04/29/2019   Procedure: HYSTERECTOMY TOTAL LAPAROSCOPIC, BILATERAL SALPINO-OOPHORECTOMY;  Surgeon: Arnaldo Purchase, MD;  Location: [facility];  Service: Gynecology;  Laterality: Bilateral;   LAPAROSCOPIC TOTAL HYSTERECTOMY     TOOTH EXTRACTION  04/01/2019   tooth removal Bilateral 2022   4 teeth removed   UPPER GASTROINTESTINAL ENDOSCOPY     Patient Active Problem List   Diagnosis Date Noted   Former smoker 09/27/2023   Primary osteoarthritis of right hip 09/14/2023   Morbid obesity (HCC) 05/22/2023   Schizoaffective disorder (HCC) 03/01/2023   Asthma 02/15/2023   Intractable chronic migraine with aura with status migrainosus 02/15/2023   Overactive bladder 02/15/2023   Poorly controlled mild persistent asthma 09/05/2021   Syncope 08/02/2021   CAP (community  acquired pneumonia) 07/18/2021   OSA (obstructive sleep apnea) 07/18/2021   Hypokalemia 07/18/2021   Depression 03/24/2020   Left lumbar radiculopathy 02/25/2020   Absence of bladder continence 02/25/2020   History of postmenopausal bleeding 04/29/2019   Polypharmacy 04/03/2019   Acute low back pain 07/31/2018   Emphysema, unspecified (HCC) 05/17/2018   Weakness 01/25/2017   Chronic migraine w/o aura w/o status migrainosus, not intractable 12/22/2014   Encounter for other general counseling or advice on contraception 09/02/2014   Bipolar disorder (HCC) 10/12/2011   Anxiety 10/12/2011   GERD (gastroesophageal reflux disease) 10/12/2011     PCP: Levora Reyes SAUNDERS, MD  REFERRING PROVIDER: Jerri Kay HERO, MD  THERAPY DIAG:  Pain in right hip  Muscle weakness  REFERRING DIAG: R hip pain  Rationale for Evaluation and Treatment:  Rehabilitation  SUBJECTIVE:  PERTINENT PAST HISTORY:  Bipolar, anxiety, emphysema, depression, schizoaffective disorder, migraines, R ACL surgery         PRECAUTIONS: None  WEIGHT BEARING RESTRICTIONS No  FALLS:  Has patient fallen in last 6 months? No, Number of falls: 0  MOI/History of condition:  Onset date: 6 months +  SUBJECTIVE STATEMENT  Pt reports that she feels she is getting stronger but she is still a bit sore.  EVAL: Pt is a 53 y.o. female who presents to clinic with chief complaint of R hip and R LE pain which started about 6 months ago.  No significant change in activity 6 months ago.  Slow onset.  Imaging largely unremarkable.  Worst when she lays down. Describes a constant ache.  Denies n/t in R LE.  Muscle relaxer is not helpful.  Excedrin helps with pain.  Feels origin is hip but can radiate all the way to ankle.  Worst first thing in the morning and gets better with movement.  Denies back pain.   Red flags:  denies   Pain:  Are you having pain? Yes 10/26/2023 2/10  Pain location: R hip pain, R LE pain NPRS scale:  Best: 2/10, Worst: 7/10 Aggravating factors: laying down, laying on R side Relieving factors: movement, Excedrin Pain description: aching  Occupation: NA  Assistive Device: NA  Hand Dominance: NA  Patient Goals/Specific Activities: reduce pain   OBJECTIVE:   GENERAL OBSERVATION/GAIT:  Slight antalgic gait R  SENSATION: Light touch: Appears intact   LE MMT:  MMT Right (Eval) Left (Eval)  Hip flexion (L2, L3) 3+* 4+  Knee extension (L3) 4 4  Knee flexion 4 4  Hip abduction 3+* 4+  Hip extension    Hip external rotation 3+*   Hip internal rotation 4+   Hip adduction    Ankle dorsiflexion (L4)    Ankle plantarflexion (S1)     Ankle inversion    Ankle eversion    Great Toe ext (L5)    Grossly     (Blank rows = not tested, score listed is out of 5 possible points.  N = WNL, D = diminished, C = clear for gross weakness with myotome testing, * = concordant pain with testing)  LE ROM:  ROM Right (Eval) Left (Eval)  Hip flexion    Hip extension    Hip abduction    Hip adduction    Hip internal rotation limited limited  Hip external rotation Slight limitation WNL  Knee extension    Knee flexion    Ankle dorsiflexion    Ankle plantarflexion    Ankle inversion    Ankle eversion     (  Blank rows = not tested, N = WNL, * = concordant pain with testing)  Functional Tests  Eval    30'' STS: 12x  UE used? n                                                          SPECIAL TESTS:  Fadir, faber, scour (+) R hip   PATIENT SURVEYS:  LEFS: 45/80   TODAY'S TREATMENT:   [facility] Adult PT Treatment:                                                DATE: 10/31/2023 Therapeutic Exercise: Nustep 6 min L5 UE/LE Bridging on ball - 2x10 - 5'' H/L pilates ring squeeze - 2x10 - 3'' hold H/L clams GTB - 2x10 Knee ext machine Knee flexion machine Staggered bridge  Therapeutic Activity  Heel toe raises 20x Standing hip abd - YTB - 2x10 Standing hip ext - YTB - 2x10  HOME EXERCISE PROGRAM: Access Code: [email or web address] URL: [email or web address] Date: 10/31/2023 Prepared by: Helene Gasmen  Exercises - Supine Piriformis Stretch with Foot on Ground  - 1 x daily - 7 x weekly - 1 sets - 3 reps - 20 hold - Supine Figure 4 Piriformis Stretch  - 1 x daily - 7 x weekly - 1 sets - 3 reps - 20 hold - Hooklying Hamstring Stretch with Strap  - 1 x daily - 7 x weekly - 1 sets - 3 reps - 20 hold - Walking on Treadmill  - 1 x daily - 4-7 x weekly - 1 reps - 5 time - Standing Hip Extension with Resistance at Ankles and Unilateral Counter Support  - 1 x daily - 7 x weekly - 3 sets - 10 reps -  Standing Hip Abduction with Resistance at Ankles and Counter Support  - 1 x daily - 7 x weekly - 3 sets - 10 reps - Sit to Stand Without Arm Support  - 1 x daily - 4-7 x weekly - 3 sets - 10 reps - Staggered Bridge  - 1 x daily - 7 x weekly - 2 sets - 10 reps  Treatment priorities   Eval 10/8       Hip ER/ext/abd strength        General core and LQ strengthening         Walking program         Standing strengthening and endurance                 ASSESSMENT:  CLINICAL IMPRESSION: Simon tolerated session well with no adverse reaction.  Reporting lower pain and increased exercise tolerance.  Advancing to standing exercises with concentration on hip strengthening.  Pt reports fatigue but no increase in pain today during session.  Will continue to progress as tolerated.    EVAL: Janaye is a 53 y.o. female who presents to clinic with signs and sxs consistent with R hip pain.   Consistent with physician impression of degenerative changes.  Somewhat atypical referral pattern below knee, but pt with no back pain and reproduction of concordant pain with interarticular hip special tests.   Arshiya will benefit from skilled  PT to address relevant deficits and improve comfort with daily tasks.   OBJECTIVE IMPAIRMENTS: Pain, R hip strength, R hip ROM, gait  ACTIVITY LIMITATIONS: transfers, sleeping, bending, squatting  PERSONAL FACTORS: See medical history and pertinent history   REHAB POTENTIAL: Good  CLINICAL DECISION MAKING: Evolving/moderate complexity  EVALUATION COMPLEXITY: Moderate   GOALS:   SHORT TERM GOALS: Target date: 10/30/2023   Gladys will be >75% HEP compliant to improve carryover between sessions and facilitate independent management of condition  Evaluation: ongoing Goal status: MET   LONG TERM GOALS: Target date: 11/27/2023  Kuuipo will self report >/= 50% decrease in pain from evaluation to improve function in daily tasks  Evaluation/Baseline: 8/10 max  pain Goal status: INITIAL   2.  Zakia will show a >/= 15 pt improvement in LEFS score (MCID is ~11% or 9 pts) as a proxy for functional improvement   Evaluation/Baseline: 45 pts Goal status: INITIAL   3.  Carter will start regular walking program, not limited by pain  Evaluation/Baseline: limited Goal status: INITIAL   4.  Honest will self report significant improvement in ability to sleep  Evaluation/Baseline: Poor sleep d/t pain - requires medication Goal status: INITIAL    PLAN: PT FREQUENCY: 1-2x/week  PT DURATION: 8 weeks  PLANNED INTERVENTIONS:  97164- PT Re-evaluation, 97110-Therapeutic exercises, 97530- Therapeutic activity, [id number]- Neuromuscular re-education, 97535- Self Care, 02859- Manual therapy, [id number]- Gait training, [id number]- Aquatic Therapy, [phone]- Electrical stimulation (manual), [id number]- Vasopneumatic device, [id number]- Traction (mechanical), [id number]- Ionotophoresis 4mg /ml Dexamethasone , Taping, Dry Needling, Joint manipulation, and Spinal manipulation.   Nandini Bogdanski E Marea Reasner PT PT, DPT

## 2023-10-31 NOTE — Therapy (Signed)
 OUTPATIENT PHYSICAL THERAPY LOWER EXTREMITY TREATMENT  Patient Name: Lauren Chapman MRN: [id number] DOB:October 14, 1970, 53 y.o., female Today's Date: 10/31/2023        Past Medical History:  Diagnosis Date   Allergy    Anxiety    Bipolar affective psychosis (HCC)    Complication of anesthesia     Acting silly- waving at everyone-Giddy   Depression    Emphysema lung (HCC)    inhaler prn,  followed by pcp and pulmonologist-- dr frances   Family history of adverse reaction to anesthesia    Father had rheumatic fever, and died on operating table.   GERD (gastroesophageal reflux disease)    Kidney cysts 01/24/2020   Right Kidney   Migraine    Moderate persistent asthma 09/05/2021   Sleep apnea    Smokers' cough (HCC)    per pt not productive   SUI (stress urinary incontinence, female)    Tubular adenoma of colon 07/08/2020   Wears glasses    Past Surgical History:  Procedure Laterality Date   ABDOMINAL HYSTERECTOMY N/A    Phreesia 11/30/2019   bladder stimulator     CARPAL TUNNEL RELEASE Right 09-12-2007   @[facility]    and GANGLION CYST EXCISION   COLONOSCOPY     CYSTOSCOPY N/A 04/29/2019   Procedure: CYSTOSCOPY;  Surgeon: Arnaldo Purchase, MD;  Location: [facility] [place];  Service: Gynecology;  Laterality: N/A;   DILATATION & CURETTAGE/HYSTEROSCOPY WITH MYOSURE N/A 11/13/2018   Procedure: DILATATION & CURETTAGE/HYSTEROSCOPY WITH MYOSURE;  Surgeon: Rockney Evalene SQUIBB, MD;  Location: [facility];  Service: Gynecology;  Laterality: N/A;  request 9:00am OR start time in [place] Gyn block requests one hour   DILATION AND CURETTAGE OF UTERUS  02/2019   EYE SURGERY N/A    Phreesia 11/30/2019   GANGLION CYST EXCISION     R wrist   GREAT TOE ARTHRODESIS, JONES PROCEDURE     hammer toe 2024   interstem      implant for overactive bladder   KNEE ARTHROSCOPY WITH ANTERIOR CRUCIATE LIGAMENT (ACL) REPAIR Right 10/2015   LAPAROSCOPIC CHOLECYSTECTOMY   1997   LAPAROSCOPIC HYSTERECTOMY Bilateral 04/29/2019   Procedure: HYSTERECTOMY TOTAL LAPAROSCOPIC, BILATERAL SALPINO-OOPHORECTOMY;  Surgeon: Arnaldo Purchase, MD;  Location: [facility] [place];  Service: Gynecology;  Laterality: Bilateral;   LAPAROSCOPIC TOTAL HYSTERECTOMY     TOOTH EXTRACTION  04/01/2019   tooth removal Bilateral 2022   4 teeth removed   UPPER GASTROINTESTINAL ENDOSCOPY     Patient Active Problem List   Diagnosis Date Noted   Former smoker 09/27/2023   Primary osteoarthritis of right hip 09/14/2023   Morbid obesity (HCC) 05/22/2023   Schizoaffective disorder (HCC) 03/01/2023   Asthma 02/15/2023   Intractable chronic migraine with aura with status migrainosus 02/15/2023   Overactive bladder 02/15/2023   Poorly controlled mild persistent asthma 09/05/2021   Syncope 08/02/2021   CAP (community acquired pneumonia) 07/18/2021   OSA (obstructive sleep apnea) 07/18/2021   Hypokalemia 07/18/2021   Depression 03/24/2020   Left lumbar radiculopathy 02/25/2020   Absence of bladder continence 02/25/2020   History of postmenopausal bleeding 04/29/2019   Polypharmacy 04/03/2019   Acute low back pain 07/31/2018   Emphysema, unspecified (HCC) 05/17/2018   Weakness 01/25/2017   Chronic migraine w/o aura w/o status migrainosus, not intractable 12/22/2014   Encounter for other general counseling or advice on contraception 09/02/2014   Bipolar disorder (HCC) 10/12/2011   Anxiety 10/12/2011   GERD (gastroesophageal reflux disease) 10/12/2011  PCP: Levora Reyes SAUNDERS, MD  REFERRING PROVIDER: Levora Reyes SAUNDERS, MD  THERAPY DIAG:  No diagnosis found.  REFERRING DIAG: R hip pain  Rationale for Evaluation and Treatment:  Rehabilitation  SUBJECTIVE:  PERTINENT PAST HISTORY:  Bipolar, anxiety, emphysema, depression, schizoaffective disorder, migraines, R ACL surgery         PRECAUTIONS: None  WEIGHT BEARING RESTRICTIONS No  FALLS:  Has patient fallen in last  6 months? No, Number of falls: 0  MOI/History of condition:  Onset date: 6 months +  SUBJECTIVE STATEMENT  Pt reports that she feels she is getting stronger but she is still a bit sore.  EVAL: Pt is a 53 y.o. female who presents to clinic with chief complaint of R hip and R LE pain which started about 6 months ago.  No significant change in activity 6 months ago.  Slow onset.  Imaging largely unremarkable.  Worst when she lays down. Describes a constant ache.  Denies n/t in R LE.  Muscle relaxer is not helpful.  Excedrin helps with pain.  Feels origin is hip but can radiate all the way to ankle.  Worst first thing in the morning and gets better with movement.  Denies back pain.   Red flags:  denies   Pain:  Are you having pain? Yes 10/26/2023 2/10  Pain location: R hip pain, R LE pain NPRS scale:  Best: 2/10, Worst: 7/10 Aggravating factors: laying down, laying on R side Relieving factors: movement, Excedrin Pain description: aching  Occupation: NA  Assistive Device: NA  Hand Dominance: NA  Patient Goals/Specific Activities: reduce pain   OBJECTIVE:   GENERAL OBSERVATION/GAIT:  Slight antalgic gait R  SENSATION: Light touch: Appears intact   LE MMT:  MMT Right (Eval) Left (Eval)  Hip flexion (L2, L3) 3+* 4+  Knee extension (L3) 4 4  Knee flexion 4 4  Hip abduction 3+* 4+  Hip extension    Hip external rotation 3+*   Hip internal rotation 4+   Hip adduction    Ankle dorsiflexion (L4)    Ankle plantarflexion (S1)    Ankle inversion    Ankle eversion    Great Toe ext (L5)    Grossly     (Blank rows = not tested, score listed is out of 5 possible points.  N = WNL, D = diminished, C = clear for gross weakness with myotome testing, * = concordant pain with testing)  LE ROM:  ROM Right (Eval) Left (Eval)  Hip flexion    Hip extension    Hip abduction    Hip adduction    Hip internal rotation limited limited  Hip external rotation Slight limitation WNL   Knee extension    Knee flexion    Ankle dorsiflexion    Ankle plantarflexion    Ankle inversion    Ankle eversion     (Blank rows = not tested, N = WNL, * = concordant pain with testing)  Functional Tests  Eval    30'' STS: 12x  UE used? n                                                          SPECIAL TESTS:  Fadir, faber, scour (+) R hip   PATIENT SURVEYS:  LEFS: 45/80   TODAY'S TREATMENT:  [facility] Adult PT Treatment:                                                DATE: 11/02/23 Therapeutic Exercise: Nustep 6 min L5 UE/LE Bridging on ball - 2x10 - 5'' H/L pilates ring squeeze - 2x10 - 3'' hold H/L clams GTB - 2x10 Knee ext machine Knee flexion machine Staggered bridge  Therapeutic Activity  Heel toe raises 20x Standing hip abd - YTB - 2x10 Standing hip ext - YTB - 2x10 Therapeutic Exercise: *** Manual Therapy: *** Neuromuscular re-ed: *** Therapeutic Activity: *** Modalities: *** Self Care: ***   [facility] Adult PT Treatment:                                                DATE: 10/31/2023 Therapeutic Exercise: Nustep 6 min L5 UE/LE Bridging on ball - 2x10 - 5'' H/L pilates ring squeeze - 2x10 - 3'' hold H/L clams GTB - 2x10 Knee ext machine Knee flexion machine Staggered bridge  Therapeutic Activity  Heel toe raises 20x Standing hip abd - YTB - 2x10 Standing hip ext - YTB - 2x10  HOME EXERCISE PROGRAM: Access Code: [email or web address] URL: [email or web address] Date: 10/31/2023 Prepared by: Helene Gasmen  Exercises - Supine Piriformis Stretch with Foot on Ground  - 1 x daily - 7 x weekly - 1 sets - 3 reps - 20 hold - Supine Figure 4 Piriformis Stretch  - 1 x daily - 7 x weekly - 1 sets - 3 reps - 20 hold - Hooklying Hamstring Stretch with Strap  - 1 x daily - 7 x weekly - 1 sets - 3 reps - 20 hold - Walking on Treadmill  - 1 x daily - 4-7 x weekly - 1 reps - 5 time - Standing Hip Extension with Resistance at Ankles and  Unilateral Counter Support  - 1 x daily - 7 x weekly - 3 sets - 10 reps - Standing Hip Abduction with Resistance at Ankles and Counter Support  - 1 x daily - 7 x weekly - 3 sets - 10 reps - Sit to Stand Without Arm Support  - 1 x daily - 4-7 x weekly - 3 sets - 10 reps - Staggered Bridge  - 1 x daily - 7 x weekly - 2 sets - 10 reps  Treatment priorities   Eval 10/8       Hip ER/ext/abd strength        General core and LQ strengthening         Walking program         Standing strengthening and endurance                 ASSESSMENT:  CLINICAL IMPRESSION: Chloeanne tolerated session well with no adverse reaction.  Reporting lower pain and increased exercise tolerance.  Advancing to standing exercises with concentration on hip strengthening.  Pt reports fatigue but no increase in pain today during session.  Will continue to progress as tolerated.    EVAL: Carnisha is a 53 y.o. female who presents to clinic with signs and sxs consistent with R hip pain.   Consistent with physician impression of degenerative changes.  Somewhat atypical referral pattern below knee, but  pt with no back pain and reproduction of concordant pain with interarticular hip special tests.   Shatoya will benefit from skilled PT to address relevant deficits and improve comfort with daily tasks.   OBJECTIVE IMPAIRMENTS: Pain, R hip strength, R hip ROM, gait  ACTIVITY LIMITATIONS: transfers, sleeping, bending, squatting  PERSONAL FACTORS: See medical history and pertinent history   REHAB POTENTIAL: Good  CLINICAL DECISION MAKING: Evolving/moderate complexity  EVALUATION COMPLEXITY: Moderate   GOALS:   SHORT TERM GOALS: Target date: 10/30/2023   Naphtali will be >75% HEP compliant to improve carryover between sessions and facilitate independent management of condition  Evaluation: ongoing Goal status: MET   LONG TERM GOALS: Target date: 11/27/2023  Maeven will self report >/= 50% decrease in pain from evaluation  to improve function in daily tasks  Evaluation/Baseline: 8/10 max pain Goal status: INITIAL   2.  Jaziya will show a >/= 15 pt improvement in LEFS score (MCID is ~11% or 9 pts) as a proxy for functional improvement   Evaluation/Baseline: 45 pts Goal status: INITIAL   3.  Shravya will start regular walking program, not limited by pain  Evaluation/Baseline: limited Goal status: INITIAL   4.  Giada will self report significant improvement in ability to sleep  Evaluation/Baseline: Poor sleep d/t pain - requires medication Goal status: INITIAL    PLAN: PT FREQUENCY: 1-2x/week  PT DURATION: 8 weeks  PLANNED INTERVENTIONS:  97164- PT Re-evaluation, 97110-Therapeutic exercises, 97530- Therapeutic activity, [id number]- Neuromuscular re-education, 97535- Self Care, 02859- Manual therapy, [id number]- Gait training, [id number]- Aquatic Therapy, [id number]- Electrical stimulation (manual), [id number]- Vasopneumatic device, [id number]- Traction (mechanical), [id number]- Ionotophoresis 4mg /ml Dexamethasone , Taping, Dry Needling, Joint manipulation, and Spinal manipulation.   Jeilyn Reznik MS, PT 10/31/23 5:07 PM

## 2023-11-02 ENCOUNTER — Ambulatory Visit

## 2023-11-02 DIAGNOSIS — M25551 Pain in right hip: Secondary | ICD-10-CM | POA: Diagnosis not present

## 2023-11-02 DIAGNOSIS — M6281 Muscle weakness (generalized): Secondary | ICD-10-CM

## 2023-11-07 ENCOUNTER — Ambulatory Visit: Admitting: Physical Therapy

## 2023-11-11 NOTE — Therapy (Incomplete)
 OUTPATIENT PHYSICAL THERAPY LOWER EXTREMITY TREATMENT/Progress Note  Patient Name: Lauren Chapman MRN: [id number] DOB:[birth year], 53 y.o., female Today's Date: 11/13/2023  Progress Note Reporting Period 10/02/23 to 11/13/23  See note below for Objective Data and Assessment of Progress/Goals.       PT End of Session - 11/13/23 1111     Visit Number 8    Number of Visits --   1-2x per week   Date for Recertification  11/27/23    Authorization Type [employer] duel complete    PT Start Time 1107    PT Stop Time 1150    PT Time Calculation (min) 43 min    Activity Tolerance Patient tolerated treatment well    Behavior During Therapy WFL for tasks assessed/performed               Past Medical History:  Diagnosis Date   Allergy    Anxiety    Bipolar affective psychosis (HCC)    Complication of anesthesia     Acting silly- waving at everyone-Giddy   Depression    Emphysema lung (HCC)    inhaler prn,  followed by pcp and pulmonologist-- dr frances   Family history of adverse reaction to anesthesia    Father had rheumatic fever, and died on operating table.   GERD (gastroesophageal reflux disease)    Kidney cysts 01/24/2020   Right Kidney   Migraine    Moderate persistent asthma 09/05/2021   Sleep apnea    Smokers' cough (HCC)    per pt not productive   SUI (stress urinary incontinence, female)    Tubular adenoma of colon 07/08/2020   Wears glasses    Past Surgical History:  Procedure Laterality Date   ABDOMINAL HYSTERECTOMY N/A    Phreesia 11/30/2019   bladder stimulator     CARPAL TUNNEL RELEASE Right 09-12-2007   @[facility]    and GANGLION CYST EXCISION   COLONOSCOPY     CYSTOSCOPY N/A 04/29/2019   Procedure: CYSTOSCOPY;  Surgeon: Arnaldo Purchase, MD;  Location: [facility];  Service: Gynecology;  Laterality: N/A;   DILATATION & CURETTAGE/HYSTEROSCOPY WITH MYOSURE N/A 11/13/2018   Procedure: DILATATION & CURETTAGE/HYSTEROSCOPY WITH MYOSURE;  Surgeon:  Rockney Evalene SQUIBB, MD;  Location: [facility];  Service: Gynecology;  Laterality: N/A;  request 9:00am OR start time in [place] Gyn block requests one hour   DILATION AND CURETTAGE OF UTERUS  02/2019   EYE SURGERY N/A    Phreesia 11/30/2019   GANGLION CYST EXCISION     R wrist   GREAT TOE ARTHRODESIS, JONES PROCEDURE     hammer toe 2024   interstem      implant for overactive bladder   KNEE ARTHROSCOPY WITH ANTERIOR CRUCIATE LIGAMENT (ACL) REPAIR Right 10/2015   LAPAROSCOPIC CHOLECYSTECTOMY  1997   LAPAROSCOPIC HYSTERECTOMY Bilateral 04/29/2019   Procedure: HYSTERECTOMY TOTAL LAPAROSCOPIC, BILATERAL SALPINO-OOPHORECTOMY;  Surgeon: Arnaldo Purchase, MD;  Location: [facility] ;  Service: Gynecology;  Laterality: Bilateral;   LAPAROSCOPIC TOTAL HYSTERECTOMY     TOOTH EXTRACTION  04/01/2019   tooth removal Bilateral 2022   4 teeth removed   UPPER GASTROINTESTINAL ENDOSCOPY     Patient Active Problem List   Diagnosis Date Noted   Former smoker 09/27/2023   Primary osteoarthritis of right hip 09/14/2023   Morbid obesity (HCC) 05/22/2023   Schizoaffective disorder (HCC) 03/01/2023   Asthma 02/15/2023   Intractable chronic migraine with aura with status migrainosus 02/15/2023   Overactive bladder 02/15/2023  Poorly controlled mild persistent asthma 09/05/2021   Syncope 08/02/2021   CAP (community acquired pneumonia) 07/18/2021   OSA (obstructive sleep apnea) 07/18/2021   Hypokalemia 07/18/2021   Depression 03/24/2020   Left lumbar radiculopathy 02/25/2020   Absence of bladder continence 02/25/2020   History of postmenopausal bleeding 04/29/2019   Polypharmacy 04/03/2019   Acute low back pain 07/31/2018   Emphysema, unspecified (HCC) 05/17/2018   Weakness 01/25/2017   Chronic migraine w/o aura w/o status migrainosus, not intractable 12/22/2014   Encounter for other general counseling or advice on contraception 09/02/2014   Bipolar disorder (HCC)  10/12/2011   Anxiety 10/12/2011   GERD (gastroesophageal reflux disease) 10/12/2011    PCP: Lauren Reyes SAUNDERS, MD  REFERRING PROVIDER: Jerri Kay HERO, MD  THERAPY DIAG:  Pain in right hip  Muscle weakness  REFERRING DIAG: R hip pain  Rationale for Evaluation and Treatment:  Rehabilitation  SUBJECTIVE:  PERTINENT PAST HISTORY:  Bipolar, anxiety, emphysema, depression, schizoaffective disorder, migraines, R ACL surgery         PRECAUTIONS: None  WEIGHT BEARING RESTRICTIONS No  FALLS:  Has patient fallen in last 6 months? No, Number of falls: 0  MOI/History of condition:  Onset date: 6 months +  SUBJECTIVE STATEMENT Pt reports her R hip is a little sore today.  EVAL: Pt is a 53 y.o. female who presents to clinic with chief complaint of R hip and R LE pain which started about 6 months ago.  No significant change in activity 6 months ago.  Slow onset.  Imaging largely unremarkable.  Worst when she lays down. Describes a constant ache.  Denies n/t in R LE.  Muscle relaxer is not helpful.  Excedrin helps with pain.  Feels origin is hip but can radiate all the way to ankle.  Worst first thing in the morning and gets better with movement.  Denies back pain.   Red flags:  denies   Pain:  Are you having pain? Yes 11/13/2023 2/10  Pain location: R hip pain, R LE pain NPRS scale:  Best: 2/10, Worst: 7/10 Current: 2/10 Aggravating factors: laying down, laying on R side Relieving factors: movement, Excedrin Pain description: aching  Occupation: NA  Assistive Device: NA  Hand Dominance: NA  Patient Goals/Specific Activities: reduce pain   OBJECTIVE:   GENERAL OBSERVATION/GAIT:  Slight antalgic gait R  SENSATION: Light touch: Appears intact   LE MMT:  MMT Right (Eval) Left (Eval)  Hip flexion (L2, L3) 3+* 4+  Knee extension (L3) 4 4  Knee flexion 4 4  Hip abduction 3+* 4+  Hip extension    Hip external rotation 3+*   Hip internal rotation 4+   Hip  adduction    Ankle dorsiflexion (L4)    Ankle plantarflexion (S1)    Ankle inversion    Ankle eversion    Great Toe ext (L5)    Grossly     (Blank rows = not tested, score listed is out of 5 possible points.  N = WNL, D = diminished, C = clear for gross weakness with myotome testing, * = concordant pain with testing)  LE ROM:  ROM Right (Eval) Left (Eval)  Hip flexion    Hip extension    Hip abduction    Hip adduction    Hip internal rotation limited limited  Hip external rotation Slight limitation WNL  Knee extension    Knee flexion    Ankle dorsiflexion    Ankle plantarflexion    Ankle  inversion    Ankle eversion     (Blank rows = not tested, N = WNL, * = concordant pain with testing)  Functional Tests  Eval    30'' STS: 12x  UE used? n                                                          SPECIAL TESTS:  Fadir, faber, scour (+) R hip   PATIENT SURVEYS:  LEFS: 45/80   TODAY'S TREATMENT: [facility] Adult PT Treatment:                                                DATE: 11/13/23 Therapeutic Exercise: Nustep 6 min L5 UE/LE Hip pendulum on step 10#, both Bridging on ball - 2x10 - 5'' H/L pilates ring squeeze - 2x10 - 3'' hold H/L clams GTB - 2x10 Staggered bridge x10 each Manual Therapy: FA distraction mob grade 3  [facility] Adult PT Treatment:                                                DATE: 11/02/23 Therapeutic Exercise: Nustep 6 min L5 UE/LE Bridging on ball - 2x10 - 5'' H/L pilates ring squeeze - 2x10 - 3'' hold H/L clams GTB - 2x10 Staggered bridge x10 each Knee ext machine x10 10# Knee flexion machine x10 15# Therapeutic Activity Heel toe raises 20x Standing hip abd - YTB - 2x10 Standing hip ext - YTB - 2x10  [facility] Adult PT Treatment:                                                DATE: 11/13/2023 Therapeutic Exercise: Nustep 6 min L5 UE/LE Bridging on ball - 2x10 - 5'' H/L pilates ring squeeze - 2x10 - 3'' hold H/L clams GTB -  2x10 Knee ext machine Knee flexion machine Staggered bridge  Therapeutic Activity  Heel toe raises 20x Standing hip abd - YTB - 2x10 Standing hip ext - YTB - 2x10  HOME EXERCISE PROGRAM: Access Code: [email or web address] URL: [email or web address] Date: 10/31/2023 Prepared by: Helene Gasmen  Exercises - Supine Piriformis Stretch with Foot on Ground  - 1 x daily - 7 x weekly - 1 sets - 3 reps - 20 hold - Supine Figure 4 Piriformis Stretch  - 1 x daily - 7 x weekly - 1 sets - 3 reps - 20 hold - Hooklying Hamstring Stretch with Strap  - 1 x daily - 7 x weekly - 1 sets - 3 reps - 20 hold - Walking on Treadmill  - 1 x daily - 4-7 x weekly - 1 reps - 5 time - Standing Hip Extension with Resistance at Ankles and Unilateral Counter Support  - 1 x daily - 7 x weekly - 3 sets - 10 reps - Standing Hip Abduction with Resistance at Ankles and Counter Support  - 1 x daily - 7 x weekly -  3 sets - 10 reps - Sit to Stand Without Arm Support  - 1 x daily - 4-7 x weekly - 3 sets - 10 reps - Staggered Bridge  - 1 x daily - 7 x weekly - 2 sets - 10 reps  Treatment priorities   Eval 10/8       Hip ER/ext/abd strength        General core and LQ strengthening         Walking program         Standing strengthening and endurance                 ASSESSMENT:  CLINICAL IMPRESSION: Pt responded well to both manual and off-step c cuff weight hip distractions for relief of hip pain. Continued hip strength exs. Pt was encouraged to complete her walking program to 3-4x/week  up from 2x/week. Pt is to look into obtaining cuff weights for home use. Overall, pt's hip pain and activity level are improving. Pt will continue to benefit from skilled PT to address impairments for improved function with minimized pain.  EVAL: Lauren Chapman is a 53 y.o. female who presents to clinic with signs and sxs consistent with R hip pain.   Consistent with physician impression of degenerative changes.  Somewhat atypical referral  pattern below knee, but pt with no back pain and reproduction of concordant pain with interarticular hip special tests.   Lauren Chapman will benefit from skilled PT to address relevant deficits and improve comfort with daily tasks.   OBJECTIVE IMPAIRMENTS: Pain, R hip strength, R hip ROM, gait  ACTIVITY LIMITATIONS: transfers, sleeping, bending, squatting  PERSONAL FACTORS: See medical history and pertinent history   REHAB POTENTIAL: Good  CLINICAL DECISION MAKING: Evolving/moderate complexity  EVALUATION COMPLEXITY: Moderate   GOALS:   SHORT TERM GOALS: Target date: 10/30/2023   Lauren Chapman will be >75% HEP compliant to improve carryover between sessions and facilitate independent management of condition  Evaluation: ongoing Goal status: MET   LONG TERM GOALS: Target date: 11/27/2023  Lauren Chapman will self report >/= 50% decrease in pain from evaluation to improve function in daily tasks  Evaluation/Baseline: 8/10 max pain 11/13/23: 40% Goal status: IMPROVED   2.  Lauren Chapman will show a >/= 15 pt improvement in LEFS score (MCID is ~11% or 9 pts) as a proxy for functional improvement   Evaluation/Baseline: 45 pts Goal status: INITIAL   3.  Lauren Chapman will start regular walking program, not limited by pain  Evaluation/Baseline: limited 11/13/23: 2 days a week for Goal status: STARTED   4.  Lauren Chapman will self report significant improvement in ability to sleep  Evaluation/Baseline: Poor sleep d/t pain - requires medication 11/13/23: Overall, yes Goal status: IMPROVED    PLAN: PT FREQUENCY: 1-2x/week  PT DURATION: 8 weeks  PLANNED INTERVENTIONS:  97164- PT Re-evaluation, 97110-Therapeutic exercises, 97530- Therapeutic activity, 97112- Neuromuscular re-education, 97535- Self Care, 02859- Manual therapy, [id number]- Gait training, [id number]- Aquatic Therapy, [phone]- Electrical stimulation (manual), [id number]- Vasopneumatic device, [id number]- Traction (mechanical), [id number]- Ionotophoresis 4mg /ml  Dexamethasone , Taping, Dry Needling, Joint manipulation, and Spinal manipulation.   Damonte Frieson MS, PT 11/13/23 12:29 PM  Dasie Daft MS, PT 11/13/23 1:03 PM

## 2023-11-13 ENCOUNTER — Ambulatory Visit

## 2023-11-13 DIAGNOSIS — M25551 Pain in right hip: Secondary | ICD-10-CM

## 2023-11-13 DIAGNOSIS — M6281 Muscle weakness (generalized): Secondary | ICD-10-CM

## 2023-11-20 NOTE — Therapy (Signed)
 OUTPATIENT PHYSICAL THERAPY LOWER EXTREMITY TREATMENT/Progress Note  Patient Name: Lauren Chapman MRN: [id number] DOB:May 18, 1970, 53 y.o., female Today's Date: 11/20/2023  Progress Note Reporting Period 10/02/23 to 11/13/23  See note below for Objective Data and Assessment of Progress/Goals.              Past Medical History:  Diagnosis Date   Allergy    Anxiety    Bipolar affective psychosis (HCC)    Complication of anesthesia     Acting silly- waving at everyone-Giddy   Depression    Emphysema lung (HCC)    inhaler prn,  followed by pcp and pulmonologist-- dr frances   Family history of adverse reaction to anesthesia    Father had rheumatic fever, and died on operating table.   GERD (gastroesophageal reflux disease)    Kidney cysts 01/24/2020   Right Kidney   Migraine    Moderate persistent asthma 09/05/2021   Sleep apnea    Smokers' cough (HCC)    per pt not productive   SUI (stress urinary incontinence, female)    Tubular adenoma of colon 07/08/2020   Wears glasses    Past Surgical History:  Procedure Laterality Date   ABDOMINAL HYSTERECTOMY N/A    Phreesia 11/30/2019   bladder stimulator     CARPAL TUNNEL RELEASE Right 09-12-2007   @[facility]    and GANGLION CYST EXCISION   COLONOSCOPY     CYSTOSCOPY N/A 04/29/2019   Procedure: CYSTOSCOPY;  Surgeon: Arnaldo Purchase, MD;  Location: [facility];  Service: Gynecology;  Laterality: N/A;   DILATATION & CURETTAGE/HYSTEROSCOPY WITH MYOSURE N/A 11/13/2018   Procedure: DILATATION & CURETTAGE/HYSTEROSCOPY WITH MYOSURE;  Surgeon: Rockney Evalene SQUIBB, MD;  Location: [facility];  Service: Gynecology;  Laterality: N/A;  request 9:00am OR start time in [place] Gyn block requests one hour   DILATION AND CURETTAGE OF UTERUS  02/2019   EYE SURGERY N/A    Phreesia 11/30/2019   GANGLION CYST EXCISION     R wrist   GREAT TOE ARTHRODESIS, JONES PROCEDURE     hammer toe 2024   interstem       implant for overactive bladder   KNEE ARTHROSCOPY WITH ANTERIOR CRUCIATE LIGAMENT (ACL) REPAIR Right 10/2015   LAPAROSCOPIC CHOLECYSTECTOMY  1997   LAPAROSCOPIC HYSTERECTOMY Bilateral 04/29/2019   Procedure: HYSTERECTOMY TOTAL LAPAROSCOPIC, BILATERAL SALPINO-OOPHORECTOMY;  Surgeon: Arnaldo Purchase, MD;  Location: [facility];  Service: Gynecology;  Laterality: Bilateral;   LAPAROSCOPIC TOTAL HYSTERECTOMY     TOOTH EXTRACTION  04/01/2019   tooth removal Bilateral 2022   4 teeth removed   UPPER GASTROINTESTINAL ENDOSCOPY     Patient Active Problem List   Diagnosis Date Noted   Former smoker 09/27/2023   Primary osteoarthritis of right hip 09/14/2023   Morbid obesity (HCC) 05/22/2023   Schizoaffective disorder (HCC) 03/01/2023   Asthma 02/15/2023   Intractable chronic migraine with aura with status migrainosus 02/15/2023   Overactive bladder 02/15/2023   Poorly controlled mild persistent asthma 09/05/2021   Syncope 08/02/2021   CAP (community acquired pneumonia) 07/18/2021   OSA (obstructive sleep apnea) 07/18/2021   Hypokalemia 07/18/2021   Depression 03/24/2020   Left lumbar radiculopathy 02/25/2020   Absence of bladder continence 02/25/2020   History of postmenopausal bleeding 04/29/2019   Polypharmacy 04/03/2019   Acute low back pain 07/31/2018   Emphysema, unspecified (HCC) 05/17/2018   Weakness 01/25/2017   Chronic migraine w/o aura w/o status migrainosus, not intractable 12/22/2014   Encounter for other  general counseling or advice on contraception 09/02/2014   Bipolar disorder (HCC) 10/12/2011   Anxiety 10/12/2011   GERD (gastroesophageal reflux disease) 10/12/2011    PCP: Levora Reyes SAUNDERS, MD  REFERRING PROVIDER: Levora Reyes SAUNDERS, MD  THERAPY DIAG:  No diagnosis found.  REFERRING DIAG: R hip pain  Rationale for Evaluation and Treatment:  Rehabilitation  SUBJECTIVE:  PERTINENT PAST HISTORY:  Bipolar, anxiety, emphysema, depression,  schizoaffective disorder, migraines, R ACL surgery         PRECAUTIONS: None  WEIGHT BEARING RESTRICTIONS No  FALLS:  Has patient fallen in last 6 months? No, Number of falls: 0  MOI/History of condition:  Onset date: 6 months +  SUBJECTIVE STATEMENT Pt reports her R hip is a little sore today.  EVAL: Pt is a 53 y.o. female who presents to clinic with chief complaint of R hip and R LE pain which started about 6 months ago.  No significant change in activity 6 months ago.  Slow onset.  Imaging largely unremarkable.  Worst when she lays down. Describes a constant ache.  Denies n/t in R LE.  Muscle relaxer is not helpful.  Excedrin helps with pain.  Feels origin is hip but can radiate all the way to ankle.  Worst first thing in the morning and gets better with movement.  Denies back pain.   Red flags:  denies   Pain:  Are you having pain? Yes 11/13/2023 2/10  Pain location: R hip pain, R LE pain NPRS scale:  Best: 2/10, Worst: 7/10 Current: 2/10 Aggravating factors: laying down, laying on R side Relieving factors: movement, Excedrin Pain description: aching  Occupation: NA  Assistive Device: NA  Hand Dominance: NA  Patient Goals/Specific Activities: reduce pain   OBJECTIVE:   GENERAL OBSERVATION/GAIT:  Slight antalgic gait R  SENSATION: Light touch: Appears intact   LE MMT:  MMT Right (Eval) Left (Eval)  Hip flexion (L2, L3) 3+* 4+  Knee extension (L3) 4 4  Knee flexion 4 4  Hip abduction 3+* 4+  Hip extension    Hip external rotation 3+*   Hip internal rotation 4+   Hip adduction    Ankle dorsiflexion (L4)    Ankle plantarflexion (S1)    Ankle inversion    Ankle eversion    Great Toe ext (L5)    Grossly     (Blank rows = not tested, score listed is out of 5 possible points.  N = WNL, D = diminished, C = clear for gross weakness with myotome testing, * = concordant pain with testing)  LE ROM:  ROM Right (Eval) Left (Eval)  Hip flexion    Hip  extension    Hip abduction    Hip adduction    Hip internal rotation limited limited  Hip external rotation Slight limitation WNL  Knee extension    Knee flexion    Ankle dorsiflexion    Ankle plantarflexion    Ankle inversion    Ankle eversion     (Blank rows = not tested, N = WNL, * = concordant pain with testing)  Functional Tests  Eval    30'' STS: 12x  UE used? n  SPECIAL TESTS:  Fadir, faber, scour (+) R hip   PATIENT SURVEYS:  LEFS: 45/80   TODAY'S TREATMENT: [facility] Adult PT Treatment:                                                DATE: 11/21/23 Therapeutic Exercise: Nustep 6 min L5 UE/LE Hip pendulum on step 10#, both Bridging on ball - 2x10 - 5'' H/L pilates ring squeeze - 2x10 - 3'' hold H/L clams GTB - 2x10 Staggered bridge x10 each Manual Therapy: FA distraction mob grade 3 Therapeutic Exercise: *** Manual Therapy: *** Neuromuscular re-ed: *** Therapeutic Activity: *** Modalities: *** Self Care: ***  [facility] Adult PT Treatment:                                                DATE: 11/13/23 Therapeutic Exercise: Nustep 6 min L5 UE/LE Hip pendulum on step 10#, both Bridging on ball - 2x10 - 5'' H/L pilates ring squeeze - 2x10 - 3'' hold H/L clams GTB - 2x10 Staggered bridge x10 each Manual Therapy: FA distraction mob grade 3  [facility] Adult PT Treatment:                                                DATE: 11/02/23 Therapeutic Exercise: Nustep 6 min L5 UE/LE Bridging on ball - 2x10 - 5'' H/L pilates ring squeeze - 2x10 - 3'' hold H/L clams GTB - 2x10 Staggered bridge x10 each Knee ext machine x10 10# Knee flexion machine x10 15# Therapeutic Activity Heel toe raises 20x Standing hip abd - YTB - 2x10 Standing hip ext - YTB - 2x10  [facility] Adult PT Treatment:                                                DATE: 11/20/2023 Therapeutic Exercise: Nustep 6 min L5  UE/LE Bridging on ball - 2x10 - 5'' H/L pilates ring squeeze - 2x10 - 3'' hold H/L clams GTB - 2x10 Knee ext machine Knee flexion machine Staggered bridge  Therapeutic Activity  Heel toe raises 20x Standing hip abd - YTB - 2x10 Standing hip ext - YTB - 2x10  HOME EXERCISE PROGRAM: Access Code: [email or web address] URL: [email or web address] Date: 10/31/2023 Prepared by: Helene Gasmen  Exercises - Supine Piriformis Stretch with Foot on Ground  - 1 x daily - 7 x weekly - 1 sets - 3 reps - 20 hold - Supine Figure 4 Piriformis Stretch  - 1 x daily - 7 x weekly - 1 sets - 3 reps - 20 hold - Hooklying Hamstring Stretch with Strap  - 1 x daily - 7 x weekly - 1 sets - 3 reps - 20 hold - Walking on Treadmill  - 1 x daily - 4-7 x weekly - 1 reps - 5 time - Standing Hip Extension with Resistance at Ankles and Unilateral Counter Support  - 1 x daily - 7 x weekly - 3 sets - 10 reps -  Standing Hip Abduction with Resistance at Ankles and Counter Support  - 1 x daily - 7 x weekly - 3 sets - 10 reps - Sit to Stand Without Arm Support  - 1 x daily - 4-7 x weekly - 3 sets - 10 reps - Staggered Bridge  - 1 x daily - 7 x weekly - 2 sets - 10 reps  Treatment priorities   Eval 10/8       Hip ER/ext/abd strength        General core and LQ strengthening         Walking program         Standing strengthening and endurance                 ASSESSMENT:  CLINICAL IMPRESSION: Pt responded well to both manual and off-step c cuff weight hip distractions for relief of hip pain. Continued hip strength exs. Pt was encouraged to complete her walking program to 3-4x/week  up from 2x/week. Pt is to look into obtaining cuff weights for home use. Overall, pt's hip pain and activity level are improving. Pt will continue to benefit from skilled PT to address impairments for improved function with minimized pain.  EVAL: Clela is a 53 y.o. female who presents to clinic with signs and sxs consistent with R hip  pain.   Consistent with physician impression of degenerative changes.  Somewhat atypical referral pattern below knee, but pt with no back pain and reproduction of concordant pain with interarticular hip special tests.   Zoei will benefit from skilled PT to address relevant deficits and improve comfort with daily tasks.   OBJECTIVE IMPAIRMENTS: Pain, R hip strength, R hip ROM, gait  ACTIVITY LIMITATIONS: transfers, sleeping, bending, squatting  PERSONAL FACTORS: See medical history and pertinent history   REHAB POTENTIAL: Good  CLINICAL DECISION MAKING: Evolving/moderate complexity  EVALUATION COMPLEXITY: Moderate   GOALS:   SHORT TERM GOALS: Target date: 10/30/2023   Valeska will be >75% HEP compliant to improve carryover between sessions and facilitate independent management of condition  Evaluation: ongoing Goal status: MET   LONG TERM GOALS: Target date: 11/27/2023  Hazell will self report >/= 50% decrease in pain from evaluation to improve function in daily tasks  Evaluation/Baseline: 8/10 max pain 11/13/23: 40% Goal status: IMPROVED   2.  Zonnie will show a >/= 15 pt improvement in LEFS score (MCID is ~11% or 9 pts) as a proxy for functional improvement   Evaluation/Baseline: 45 pts Goal status: INITIAL   3.  Jenille will start regular walking program, not limited by pain  Evaluation/Baseline: limited 11/13/23: 2 days a week for Goal status: STARTED   4.  Ilyse will self report significant improvement in ability to sleep  Evaluation/Baseline: Poor sleep d/t pain - requires medication 11/13/23: Overall, yes Goal status: IMPROVED    PLAN: PT FREQUENCY: 1-2x/week  PT DURATION: 8 weeks  PLANNED INTERVENTIONS:  97164- PT Re-evaluation, 97110-Therapeutic exercises, 97530- Therapeutic activity, 97112- Neuromuscular re-education, 97535- Self Care, 02859- Manual therapy, [id number]- Gait training, [id number]- Aquatic Therapy, [phone]- Electrical stimulation  (manual), [id number]- Vasopneumatic device, [id number]- Traction (mechanical), [id number]- Ionotophoresis 4mg /ml Dexamethasone , Taping, Dry Needling, Joint manipulation, and Spinal manipulation.   Sharisa Toves MS, PT 11/20/23 4:00 PM

## 2023-11-21 ENCOUNTER — Ambulatory Visit

## 2023-11-21 DIAGNOSIS — M6281 Muscle weakness (generalized): Secondary | ICD-10-CM

## 2023-11-21 DIAGNOSIS — M25551 Pain in right hip: Secondary | ICD-10-CM | POA: Diagnosis not present

## 2023-11-22 NOTE — Therapy (Signed)
 OUTPATIENT PHYSICAL THERAPY LOWER EXTREMITY TREATMENT/Progress Note  Patient Name: KARITA DRALLE MRN: [id number] DOB:12/06/1970, 53 y.o., female Today's Date:   Past Medical History:  Diagnosis Date   Allergy    Anxiety    Bipolar affective psychosis (HCC)    Complication of anesthesia     Acting silly- waving at everyone-Giddy   Depression    Emphysema lung (HCC)    inhaler prn,  followed by pcp and pulmonologist-- dr frances   Family history of adverse reaction to anesthesia    Father had rheumatic fever, and died on operating table.   GERD (gastroesophageal reflux disease)    Kidney cysts 01/24/2020   Right Kidney   Migraine    Moderate persistent asthma 09/05/2021   Sleep apnea    Smokers' cough (HCC)    per pt not productive   SUI (stress urinary incontinence, female)    Tubular adenoma of colon 07/08/2020   Wears glasses    Past Surgical History:  Procedure Laterality Date   ABDOMINAL HYSTERECTOMY N/A    Phreesia 11/30/2019   bladder stimulator     CARPAL TUNNEL RELEASE Right 09-12-2007   @[facility]    and GANGLION CYST EXCISION   COLONOSCOPY     CYSTOSCOPY N/A 04/29/2019   Procedure: CYSTOSCOPY;  Surgeon: Arnaldo Purchase, MD;  Location: [facility];  Service: Gynecology;  Laterality: N/A;   DILATATION & CURETTAGE/HYSTEROSCOPY WITH MYOSURE N/A 11/13/2018   Procedure: DILATATION & CURETTAGE/HYSTEROSCOPY WITH MYOSURE;  Surgeon: Rockney Evalene SQUIBB, MD;  Location: [facility];  Service: Gynecology;  Laterality: N/A;  request 9:00am OR start time in [place] Gyn block requests one hour   DILATION AND CURETTAGE OF UTERUS  02/2019   EYE SURGERY N/A    Phreesia 11/30/2019   GANGLION CYST EXCISION     R wrist   GREAT TOE ARTHRODESIS, JONES PROCEDURE     hammer toe 2024   interstem      implant for overactive bladder   KNEE ARTHROSCOPY WITH ANTERIOR CRUCIATE LIGAMENT (ACL) REPAIR Right 10/2015   LAPAROSCOPIC CHOLECYSTECTOMY  1997    LAPAROSCOPIC HYSTERECTOMY Bilateral 04/29/2019   Procedure: HYSTERECTOMY TOTAL LAPAROSCOPIC, BILATERAL SALPINO-OOPHORECTOMY;  Surgeon: Arnaldo Purchase, MD;  Location: [facility];  Service: Gynecology;  Laterality: Bilateral;   LAPAROSCOPIC TOTAL HYSTERECTOMY     TOOTH EXTRACTION  04/01/2019   tooth removal Bilateral 2022   4 teeth removed   UPPER GASTROINTESTINAL ENDOSCOPY     Patient Active Problem List   Diagnosis Date Noted   Former smoker 09/27/2023   Primary osteoarthritis of right hip 09/14/2023   Morbid obesity (HCC) 05/22/2023   Schizoaffective disorder (HCC) 03/01/2023   Asthma 02/15/2023   Intractable chronic migraine with aura with status migrainosus 02/15/2023   Overactive bladder 02/15/2023   Poorly controlled mild persistent asthma 09/05/2021   Syncope 08/02/2021   CAP (community acquired pneumonia) 07/18/2021   OSA (obstructive sleep apnea) 07/18/2021   Hypokalemia 07/18/2021   Depression 03/24/2020   Left lumbar radiculopathy 02/25/2020   Absence of bladder continence 02/25/2020   History of postmenopausal bleeding 04/29/2019   Polypharmacy 04/03/2019   Acute low back pain 07/31/2018   Emphysema, unspecified (HCC) 05/17/2018   Weakness 01/25/2017   Chronic migraine w/o aura w/o status migrainosus, not intractable 12/22/2014   Encounter for other general counseling or advice on contraception 09/02/2014   Bipolar disorder (HCC) 10/12/2011   Anxiety 10/12/2011   GERD (gastroesophageal reflux disease) 10/12/2011    PCP: Levora Purchase SAUNDERS,  MD  REFERRING PROVIDER: Levora Reyes SAUNDERS, MD  THERAPY DIAG:  Pain in right hip  Muscle weakness  REFERRING DIAG: R hip pain  Rationale for Evaluation and Treatment:  Rehabilitation  SUBJECTIVE:  PERTINENT PAST HISTORY:  Bipolar, anxiety, emphysema, depression, schizoaffective disorder, migraines, R ACL surgery         PRECAUTIONS: None  WEIGHT BEARING RESTRICTIONS No  FALLS:  Has patient  fallen in last 6 months? No, Number of falls: 0  MOI/History of condition:  Onset date: 6 months +  SUBJECTIVE STATEMENT Pt reports being very active last Friday and she experienced a mod increase in pain on Saturday which resolved by Monday.  EVAL: Pt is a 53 y.o. female who presents to clinic with chief complaint of R hip and R LE pain which started about 6 months ago.  No significant change in activity 6 months ago.  Slow onset.  Imaging largely unremarkable.  Worst when she lays down. Describes a constant ache.  Denies n/t in R LE.  Muscle relaxer is not helpful.  Excedrin helps with pain.  Feels origin is hip but can radiate all the way to ankle.  Worst first thing in the morning and gets better with movement.  Denies back pain.   Red flags:  denies   Pain:  Are you having pain? Yes 11/23/2023  3/10  Pain location: R hip pain, R LE pain NPRS scale:  Best: 2/10, Worst: 7/10 Current: 2/10 Aggravating factors: laying down, laying on R side Relieving factors: movement, Excedrin Pain description: aching  Occupation: NA  Assistive Device: NA  Hand Dominance: NA  Patient Goals/Specific Activities: reduce pain   OBJECTIVE:   GENERAL OBSERVATION/GAIT:  Slight antalgic gait R  SENSATION: Light touch: Appears intact   LE MMT:  MMT Right (Eval) Left (Eval)  Hip flexion (L2, L3) 3+* 4+  Knee extension (L3) 4 4  Knee flexion 4 4  Hip abduction 3+* 4+  Hip extension    Hip external rotation 3+*   Hip internal rotation 4+   Hip adduction    Ankle dorsiflexion (L4)    Ankle plantarflexion (S1)    Ankle inversion    Ankle eversion    Great Toe ext (L5)    Grossly     (Blank rows = not tested, score listed is out of 5 possible points.  N = WNL, D = diminished, C = clear for gross weakness with myotome testing, * = concordant pain with testing)  LE ROM:  ROM Right (Eval) Left (Eval)  Hip flexion    Hip extension    Hip abduction    Hip adduction    Hip  internal rotation limited limited  Hip external rotation Slight limitation WNL  Knee extension    Knee flexion    Ankle dorsiflexion    Ankle plantarflexion    Ankle inversion    Ankle eversion     (Blank rows = not tested, N = WNL, * = concordant pain with testing)  Functional Tests  Eval    30'' STS: 12x  UE used? n                                                          SPECIAL TESTS:  Fadir, faber, scour (+) R hip   PATIENT SURVEYS:  LEFS: 45/80   TODAY'S TREATMENT: [facility] Adult PT Treatment:                                                DATE: 11/23/23 Therapeutic Exercise: Nustep 6 min L5 UE/LE Bridging on ball - x15 - 5'' H/L pilates ring squeeze - x15 - 3'' hold H/L clams GTB - x15 S/L hip abd x15 Staggered bridge x10 each Hip pendulum on step 10#, both Manual Therapy: FA distraction mob grade 3  [facility] Adult PT Treatment:                                                DATE: 11/21/23 Therapeutic Exercise: Nustep 6 min L5 UE/LE Bridging on ball - 2x10 - 5'' H/L pilates ring squeeze - 2x10 - 3'' hold H/L clams GTB - 2x10 S/L hip abd 2x10 Staggered bridge 2x10 each Manual Therapy: FA distraction mob grade 3  HOME EXERCISE PROGRAM: Access Code: [email or web address] URL: [email or web address] Date: 10/31/2023 Prepared by: Helene Gasmen  Exercises - Supine Piriformis Stretch with Foot on Ground  - 1 x daily - 7 x weekly - 1 sets - 3 reps - 20 hold - Supine Figure 4 Piriformis Stretch  - 1 x daily - 7 x weekly - 1 sets - 3 reps - 20 hold - Hooklying Hamstring Stretch with Strap  - 1 x daily - 7 x weekly - 1 sets - 3 reps - 20 hold - Walking on Treadmill  - 1 x daily - 4-7 x weekly - 1 reps - 5 time - Standing Hip Extension with Resistance at Ankles and Unilateral Counter Support  - 1 x daily - 7 x weekly - 3 sets - 10 reps - Standing Hip Abduction with Resistance at Ankles and Counter Support  - 1 x daily - 7 x weekly - 3 sets - 10  reps - Sit to Stand Without Arm Support  - 1 x daily - 4-7 x weekly - 3 sets - 10 reps - Staggered Bridge  - 1 x daily - 7 x weekly - 2 sets - 10 reps  Treatment priorities   Eval 10/8       Hip ER/ext/abd strength        General core and LQ strengthening         Walking program         Standing strengthening and endurance                 ASSESSMENT:  CLINICAL IMPRESSION: The PT session time frame was limited today due to pt having another PT appt. Pt was continued for hip/LE strengthening and manual for distraction. Pt has 2 more PT appts. Will assess LTGs and develop final HEP prior to anticipated DC.  EVAL: Evona is a 53 y.o. female who presents to clinic with signs and sxs consistent with R hip pain.   Consistent with physician impression of degenerative changes.  Somewhat atypical referral pattern below knee, but pt with no back pain and reproduction of concordant pain with interarticular hip special tests.   Cameryn will benefit from skilled PT to address relevant deficits and improve comfort with daily tasks.   OBJECTIVE IMPAIRMENTS: Pain, R hip strength,  R hip ROM, gait  ACTIVITY LIMITATIONS: transfers, sleeping, bending, squatting  PERSONAL FACTORS: See medical history and pertinent history   REHAB POTENTIAL: Good  CLINICAL DECISION MAKING: Evolving/moderate complexity  EVALUATION COMPLEXITY: Moderate   GOALS:   SHORT TERM GOALS: Target date: 10/30/2023   Karlisha will be >75% HEP compliant to improve carryover between sessions and facilitate independent management of condition  Evaluation: ongoing Goal status: MET   LONG TERM GOALS: Target date: 11/27/2023  August will self report >/= 50% decrease in pain from evaluation to improve function in daily tasks  Evaluation/Baseline: 8/10 max pain 11/13/23: 40% Goal status: IMPROVED   2.  Kassadee will show a >/= 15 pt improvement in LEFS score (MCID is ~11% or 9 pts) as a proxy for functional improvement    Evaluation/Baseline: 45 pts Goal status: INITIAL   3.  Delani will start regular walking program, not limited by pain  Evaluation/Baseline: limited 11/13/23: 2 days a week for Goal status: STARTED   4.  Jaielle will self report significant improvement in ability to sleep  Evaluation/Baseline: Poor sleep d/t pain - requires medication 11/13/23: Overall, yes Goal status: IMPROVED    PLAN: PT FREQUENCY: 1-2x/week  PT DURATION: 8 weeks  PLANNED INTERVENTIONS:  97164- PT Re-evaluation, 97110-Therapeutic exercises, 97530- Therapeutic activity, 97112- Neuromuscular re-education, 97535- Self Care, 02859- Manual therapy, [id number]- Gait training, [id number]- Aquatic Therapy, [phone]- Electrical stimulation (manual), [id number]- Vasopneumatic device, [id number]- Traction (mechanical), [id number]- Ionotophoresis 4mg /ml Dexamethasone , Taping, Dry Needling, Joint manipulation, and Spinal manipulation.   Omelia Marquart MS, PT 11/23/23 11:36 AM

## 2023-11-23 ENCOUNTER — Ambulatory Visit

## 2023-11-23 DIAGNOSIS — M25551 Pain in right hip: Secondary | ICD-10-CM | POA: Diagnosis not present

## 2023-11-23 DIAGNOSIS — M6281 Muscle weakness (generalized): Secondary | ICD-10-CM

## 2023-11-26 ENCOUNTER — Encounter: Payer: Self-pay | Admitting: Radiology

## 2023-11-26 NOTE — Therapy (Signed)
 OUTPATIENT PHYSICAL THERAPY LOWER EXTREMITY TREATMENT/Progress Note  Patient Name: KASIYA BURCK MRN: [id number] DOB:[birth year], 53 y.o., female Today's Date:   Past Medical History:  Diagnosis Date   Allergy    Anxiety    Bipolar affective psychosis (HCC)    Complication of anesthesia     Acting silly- waving at everyone-Giddy   Depression    Emphysema lung (HCC)    inhaler prn,  followed by pcp and pulmonologist-- dr frances   Family history of adverse reaction to anesthesia    Father had rheumatic fever, and died on operating table.   GERD (gastroesophageal reflux disease)    Kidney cysts 01/24/2020   Right Kidney   Migraine    Moderate persistent asthma 09/05/2021   Sleep apnea    Smokers' cough (HCC)    per pt not productive   SUI (stress urinary incontinence, female)    Tubular adenoma of colon 07/08/2020   Wears glasses    Past Surgical History:  Procedure Laterality Date   ABDOMINAL HYSTERECTOMY N/A    Phreesia 11/30/2019   bladder stimulator     CARPAL TUNNEL RELEASE Right 09-12-2007   @[facility]    and GANGLION CYST EXCISION   COLONOSCOPY     CYSTOSCOPY N/A 04/29/2019   Procedure: CYSTOSCOPY;  Surgeon: Arnaldo Purchase, MD;  Location: [facility];  Service: Gynecology;  Laterality: N/A;   DILATATION & CURETTAGE/HYSTEROSCOPY WITH MYOSURE N/A 11/13/2018   Procedure: DILATATION & CURETTAGE/HYSTEROSCOPY WITH MYOSURE;  Surgeon: Rockney Evalene SQUIBB, MD;  Location: [facility];  Service: Gynecology;  Laterality: N/A;  request 9:00am OR start time in [place] Gyn block requests one hour   DILATION AND CURETTAGE OF UTERUS  02/2019   EYE SURGERY N/A    Phreesia 11/30/2019   GANGLION CYST EXCISION     R wrist   GREAT TOE ARTHRODESIS, JONES PROCEDURE     hammer toe 2024   interstem      implant for overactive bladder   KNEE ARTHROSCOPY WITH ANTERIOR CRUCIATE LIGAMENT (ACL) REPAIR Right 10/2015   LAPAROSCOPIC CHOLECYSTECTOMY  1997    LAPAROSCOPIC HYSTERECTOMY Bilateral 04/29/2019   Procedure: HYSTERECTOMY TOTAL LAPAROSCOPIC, BILATERAL SALPINO-OOPHORECTOMY;  Surgeon: Arnaldo Purchase, MD;  Location: [facility];  Service: Gynecology;  Laterality: Bilateral;   LAPAROSCOPIC TOTAL HYSTERECTOMY     TOOTH EXTRACTION  04/01/2019   tooth removal Bilateral 2022   4 teeth removed   UPPER GASTROINTESTINAL ENDOSCOPY     Patient Active Problem List   Diagnosis Date Noted   Former smoker 09/27/2023   Primary osteoarthritis of right hip 09/14/2023   Morbid obesity (HCC) 05/22/2023   Schizoaffective disorder (HCC) 03/01/2023   Asthma 02/15/2023   Intractable chronic migraine with aura with status migrainosus 02/15/2023   Overactive bladder 02/15/2023   Poorly controlled mild persistent asthma 09/05/2021   Syncope 08/02/2021   CAP (community acquired pneumonia) 07/18/2021   OSA (obstructive sleep apnea) 07/18/2021   Hypokalemia 07/18/2021   Depression 03/24/2020   Left lumbar radiculopathy 02/25/2020   Absence of bladder continence 02/25/2020   History of postmenopausal bleeding 04/29/2019   Polypharmacy 04/03/2019   Acute low back pain 07/31/2018   Emphysema, unspecified (HCC) 05/17/2018   Weakness 01/25/2017   Chronic migraine w/o aura w/o status migrainosus, not intractable 12/22/2014   Encounter for other general counseling or advice on contraception 09/02/2014   Bipolar disorder (HCC) 10/12/2011   Anxiety 10/12/2011   GERD (gastroesophageal reflux disease) 10/12/2011    PCP: Levora Purchase SAUNDERS,  MD  REFERRING PROVIDER: Jerri Kay HERO, MD  THERAPY DIAG:  No diagnosis found.  REFERRING DIAG: R hip pain  Rationale for Evaluation and Treatment:  Rehabilitation  SUBJECTIVE:  PERTINENT PAST HISTORY:  Bipolar, anxiety, emphysema, depression, schizoaffective disorder, migraines, R ACL surgery         PRECAUTIONS: None  WEIGHT BEARING RESTRICTIONS No  FALLS:  Has patient fallen in last 6 months?  No, Number of falls: 0  MOI/History of condition:  Onset date: 6 months +  SUBJECTIVE STATEMENT Pt reports being very active last Friday and she experienced a mod increase in pain on Saturday which resolved by Monday.  EVAL: Pt is a 53 y.o. female who presents to clinic with chief complaint of R hip and R LE pain which started about 6 months ago.  No significant change in activity 6 months ago.  Slow onset.  Imaging largely unremarkable.  Worst when she lays down. Describes a constant ache.  Denies n/t in R LE.  Muscle relaxer is not helpful.  Excedrin helps with pain.  Feels origin is hip but can radiate all the way to ankle.  Worst first thing in the morning and gets better with movement.  Denies back pain.   Red flags:  denies   Pain:  Are you having pain? Yes 11/26/2023  3/10  Pain location: R hip pain, R LE pain NPRS scale:  Best: 2/10, Worst: 7/10 Current: 2/10 Aggravating factors: laying down, laying on R side Relieving factors: movement, Excedrin Pain description: aching  Occupation: NA  Assistive Device: NA  Hand Dominance: NA  Patient Goals/Specific Activities: reduce pain   OBJECTIVE:   GENERAL OBSERVATION/GAIT:  Slight antalgic gait R  SENSATION: Light touch: Appears intact   LE MMT:  MMT Right (Eval) Left (Eval)  Hip flexion (L2, L3) 3+* 4+  Knee extension (L3) 4 4  Knee flexion 4 4  Hip abduction 3+* 4+  Hip extension    Hip external rotation 3+*   Hip internal rotation 4+   Hip adduction    Ankle dorsiflexion (L4)    Ankle plantarflexion (S1)    Ankle inversion    Ankle eversion    Great Toe ext (L5)    Grossly     (Blank rows = not tested, score listed is out of 5 possible points.  N = WNL, D = diminished, C = clear for gross weakness with myotome testing, * = concordant pain with testing)  LE ROM:  ROM Right (Eval) Left (Eval)  Hip flexion    Hip extension    Hip abduction    Hip adduction    Hip internal rotation limited limited   Hip external rotation Slight limitation WNL  Knee extension    Knee flexion    Ankle dorsiflexion    Ankle plantarflexion    Ankle inversion    Ankle eversion     (Blank rows = not tested, N = WNL, * = concordant pain with testing)  Functional Tests  Eval    30'' STS: 12x  UE used? n                                                          SPECIAL TESTS:  Fadir, faber, scour (+) R hip   PATIENT SURVEYS:  LEFS: 45/80  TODAY'S TREATMENT: [facility] Adult PT Treatment:                                                DATE: 11/27/23 Therapeutic Exercise: Nustep 6 min L5 UE/LE Bridging on ball - x15 - 5'' H/L pilates ring squeeze - x15 - 3'' hold H/L clams GTB - x15 S/L hip abd x15 Staggered bridge x10 each Hip pendulum on step 10#, both Manual Therapy: FA distraction mob grade 3 Therapeutic Exercise: *** Manual Therapy: *** Neuromuscular re-ed: *** Therapeutic Activity: *** Modalities: *** Self Care: ***  [facility] Adult PT Treatment:                                                DATE: 11/23/23 Therapeutic Exercise: Nustep 6 min L5 UE/LE Bridging on ball - x15 - 5'' H/L pilates ring squeeze - x15 - 3'' hold H/L clams GTB - x15 S/L hip abd x15 Staggered bridge x10 each Hip pendulum on step 10#, both Manual Therapy: FA distraction mob grade 3  [facility] Adult PT Treatment:                                                DATE: 11/21/23 Therapeutic Exercise: Nustep 6 min L5 UE/LE Bridging on ball - 2x10 - 5'' H/L pilates ring squeeze - 2x10 - 3'' hold H/L clams GTB - 2x10 S/L hip abd 2x10 Staggered bridge 2x10 each Manual Therapy: FA distraction mob grade 3  HOME EXERCISE PROGRAM: Access Code: [email or web address] URL: [email or web address] Date: 10/31/2023 Prepared by: Helene Gasmen  Exercises - Supine Piriformis Stretch with Foot on Ground  - 1 x daily - 7 x weekly - 1 sets - 3 reps - 20 hold - Supine Figure 4 Piriformis Stretch  - 1 x  daily - 7 x weekly - 1 sets - 3 reps - 20 hold - Hooklying Hamstring Stretch with Strap  - 1 x daily - 7 x weekly - 1 sets - 3 reps - 20 hold - Walking on Treadmill  - 1 x daily - 4-7 x weekly - 1 reps - 5 time - Standing Hip Extension with Resistance at Ankles and Unilateral Counter Support  - 1 x daily - 7 x weekly - 3 sets - 10 reps - Standing Hip Abduction with Resistance at Ankles and Counter Support  - 1 x daily - 7 x weekly - 3 sets - 10 reps - Sit to Stand Without Arm Support  - 1 x daily - 4-7 x weekly - 3 sets - 10 reps - Staggered Bridge  - 1 x daily - 7 x weekly - 2 sets - 10 reps  Treatment priorities   Eval 10/8       Hip ER/ext/abd strength        General core and LQ strengthening         Walking program         Standing strengthening and endurance                 ASSESSMENT:  CLINICAL IMPRESSION: The PT session time frame  was limited today due to pt having another PT appt. Pt was continued for hip/LE strengthening and manual for distraction. Pt has 2 more PT appts. Will assess LTGs and develop final HEP prior to anticipated DC.  EVAL: Andrea is a 53 y.o. female who presents to clinic with signs and sxs consistent with R hip pain.   Consistent with physician impression of degenerative changes.  Somewhat atypical referral pattern below knee, but pt with no back pain and reproduction of concordant pain with interarticular hip special tests.   Neema will benefit from skilled PT to address relevant deficits and improve comfort with daily tasks.   OBJECTIVE IMPAIRMENTS: Pain, R hip strength, R hip ROM, gait  ACTIVITY LIMITATIONS: transfers, sleeping, bending, squatting  PERSONAL FACTORS: See medical history and pertinent history   REHAB POTENTIAL: Good  CLINICAL DECISION MAKING: Evolving/moderate complexity  EVALUATION COMPLEXITY: Moderate   GOALS:   SHORT TERM GOALS: Target date: 10/30/2023   Joshalyn will be >75% HEP compliant to improve carryover between  sessions and facilitate independent management of condition  Evaluation: ongoing Goal status: MET   LONG TERM GOALS: Target date: 11/27/2023  Makela will self report >/= 50% decrease in pain from evaluation to improve function in daily tasks  Evaluation/Baseline: 8/10 max pain 11/13/23: 40% Goal status: IMPROVED   2.  Noella will show a >/= 15 pt improvement in LEFS score (MCID is ~11% or 9 pts) as a proxy for functional improvement   Evaluation/Baseline: 45 pts Goal status: INITIAL   3.  Ayeisha will start regular walking program, not limited by pain  Evaluation/Baseline: limited 11/13/23: 2 days a week for Goal status: STARTED   4.  Ouida will self report significant improvement in ability to sleep  Evaluation/Baseline: Poor sleep d/t pain - requires medication 11/13/23: Overall, yes Goal status: IMPROVED    PLAN: PT FREQUENCY: 1-2x/week  PT DURATION: 8 weeks  PLANNED INTERVENTIONS:  97164- PT Re-evaluation, 97110-Therapeutic exercises, 97530- Therapeutic activity, 97112- Neuromuscular re-education, 97535- Self Care, 02859- Manual therapy, [id number]- Gait training, [id number]- Aquatic Therapy, [id number]- Electrical stimulation (manual), [id number]- Vasopneumatic device, [id number]- Traction (mechanical), [id number]- Ionotophoresis 4mg /ml Dexamethasone , Taping, Dry Needling, Joint manipulation, and Spinal manipulation.   Ronnald Shedden MS, PT 11/26/23 4:03 PM

## 2023-11-27 ENCOUNTER — Ambulatory Visit: Attending: Orthopaedic Surgery

## 2023-11-27 DIAGNOSIS — M25551 Pain in right hip: Secondary | ICD-10-CM | POA: Diagnosis present

## 2023-11-27 DIAGNOSIS — M6281 Muscle weakness (generalized): Secondary | ICD-10-CM | POA: Insufficient documentation

## 2023-11-29 ENCOUNTER — Ambulatory Visit

## 2023-12-17 NOTE — Patient Instructions (Incomplete)
 Please continue using your CPAP regularly. While your insurance requires that you use CPAP at least 4 hours each night on 70% of the nights, I recommend, that you not skip any nights and use it throughout the night if you can. Getting used to CPAP and staying with the treatment long term does take time and patience and discipline. Untreated obstructive sleep apnea when it is moderate to severe can have an adverse impact on cardiovascular health and raise her risk for heart disease, arrhythmias, hypertension, congestive heart failure, stroke and diabetes. Untreated obstructive sleep apnea causes sleep disruption, nonrestorative sleep, and sleep deprivation. This can have an impact on your day to day functioning and cause daytime sleepiness and impairment of cognitive function, memory loss, mood disturbance, and problems focussing. Using CPAP regularly can improve these symptoms.  We will update supply orders, today. I will ask DME to switch your mask style.   Follow up in 4 months

## 2023-12-17 NOTE — Progress Notes (Unsigned)
 PATIENT: Lauren Chapman DOB: Jan 21, 1971  REASON FOR VISIT: follow up HISTORY FROM: patient  No chief complaint on file.    HISTORY OF PRESENT ILLNESS:  12/17/23 ALL:  Lauren Chapman returns for follow up for OSA on CPAP.   She continues to follow with Dr Ena for migraine management. She reports doing well on Vyepti  and Nurtec.     12/13/2022 ALL:  Lauren Chapman returns for follow up for OSA on CPAP. She reports moving in with her daughter and in the process lost her machine. She was off therapy for about a month. Prior to this was doing well. She does feel more tired since being off therpay. She is having more headaches. She continues to see Dr Ena. Continues Vyepti .     12/12/2021 ALL:  Lauren Chapman returns for follow up for OSA on CPAP. She reports doing well. She does continue to have some episodes of sleep walking followed by psychiatry. She is seen every three months. She has a veterinary surgeon she sees every 2-3 weeks. She feels that she sleeps fairly well. She is using her machine nightly for about 6 hours. She reports her cat will wake her up at night. She lives with her parents. She works part time at [employer].   She is followed by Dr Ena for migraines. She continues Vyepti  infusions. She reports significant improvement on infusion.     12/09/2020 ALL: Lauren Chapman is a 53 y.o. female here today for follow up for OSA on CPAP. HST 08/2020 showed mild OSA with AHI 10.5/hr and O2 nadir 81%. She has done well with CPAP therapy. She is tolerating therapy. She is sleeping better. She may wake up during the night and not being able to go back to sleep. She will get up and drink a couple cups of coffee and usually goes back to sleep. She seems to sleep really heavy at this time. Sleepwalking is not occurring as frequently. She does continues to have daytime sleepiness. She is able to go to sleep in no time. She continues to follow up closely with psychiatry for schizoaffective  disorder.   Please see CMA note for compliance report   HISTORY: (copied from Dr Obie previous note)  Dear Dr. Levora,   I saw your patient, Lauren Chapman, upon your kind request in my today for initial consultation of her sleep disorder, in particular, concern for underlying obstructive sleep apnea and history of sleepwalking since childhood.  The patient is unaccompanied today.  As you know, Ms. Weedon is a 53 year old right-handed woman with an underlying medical history of allergies, anxiety, depression, emphysema, reflux disease, kidney cyst, migraine headaches (for which she is followed by my colleague Dr. Onita), and obesity, who reports snoring and excessive daytime somnolence as well as witnessed apneas.  She reports a longstanding history of sleepwalking since childhood.  She recently had a fall as she got out of bed.  She hit her head.  She was evaluated in the emergency room and had a head CT and cervical spine CT without contrast on 06/30/2020 and I reviewed the results: IMPRESSION: No acute intracranial abnormality seen.   Multilevel degenerative changes are noted. No acute abnormality seen in the cervical spine.    I reviewed your office note from 07/05/2020. Her Epworth sleepiness score is 20 out of 24, fatigue severity score is 49 out of 63.  Of note, she was on multiple potentially sedating medications including Abilify , Celexa , clonazepam , Neurontin , Vistaril, Tofranil, Lamictal , Remeron .  She is followed by psychiatry.  She was in inpatient care and is now followed as an outpatient.  She lives alone, her youngest daughter recently moved out.  She reports a longstanding history of sleepwalking, sleep talking, and sleep-related eating since childhood.  She has fallen while sleepwalking.  She tries to wake herself up but cannot do so and ends up falling and has hurt herself including her right knee some years ago.  She falls asleep quickly and has been sleepy at the wheel.  She  currently does not work.  Bedtime is generally between 930 and 10 and rise time around 5 AM.  She has been told by her daughters that she makes choking sounds and pauses while asleep.  She has woken up with a sense of gasping or snorting.  She has occasionally woken up with a headache.  She does not have any night to night nocturia, is not aware of any family history of sleep apnea.  Weight has been fluctuating.  She smokes about half a pack per day, does not drink any alcohol and drinks caffeine in the form of coffee, about 2 cups in the mornings.   REVIEW OF SYSTEMS: Out of a complete 14 system review of symptoms, the patient complains only of the following symptoms, daytime sleepiness, sleepwalking and all other reviewed systems are negative.  ESS: 12/24  ALLERGIES: Allergies  Allergen Reactions   Codeine Anaphylaxis and Other (See Comments)    Cannot have ANYTHING with codeine    HOME MEDICATIONS: Outpatient Medications Prior to Visit  Medication Sig Dispense Refill   albuterol  (VENTOLIN  HFA) 108 (90 Base) MCG/ACT inhaler Inhale 2 puffs into the lungs every 4 (four) hours as needed for wheezing or shortness of breath. 1 each 1   budesonide -formoterol  (SYMBICORT ) 160-4.5 MCG/ACT inhaler Inhale 2 puffs into the lungs 2 (two) times daily. 10.2 g 7   busPIRone  (BUSPAR ) 15 MG tablet Take 1 tablet (15 mg total) by mouth 3 (three) times daily. 30 tablet 0   citalopram  (CELEXA ) 40 MG tablet Take 1 tablet (40 mg total) by mouth daily. 30 tablet 0   DEPAKOTE 125 MG DR tablet Take 125 mg by mouth 3 (three) times daily.     EPINEPHrine  0.3 mg/0.3 mL IJ SOAJ injection Inject 0.3 mg into the muscle as needed for anaphylaxis. 1 each 0   esomeprazole  (NEXIUM ) 20 MG capsule Take 1 capsule (20 mg total) by mouth 2 (two) times daily before a meal. (Patient taking differently: Take 20 mg by mouth daily at 12 noon.) 60 capsule 1   gabapentin  (NEURONTIN ) 400 MG capsule Take 2 capsules (800 mg total) by mouth 3  (three) times daily.     metoprolol tartrate (LOPRESSOR) 25 MG tablet Take 25 mg by mouth 2 (two) times daily.     MYRBETRIQ  50 MG TB24 tablet Take 50 mg by mouth daily.     ondansetron  (ZOFRAN ) 4 MG tablet Take 1 tablet (4 mg total) by mouth every 8 (eight) hours as needed for nausea or vomiting. 20 tablet 6   ramelteon (ROZEREM) 8 MG tablet Take 8 mg by mouth at bedtime.     Rimegepant Sulfate (NURTEC) 75 MG TBDP Take 1 tablet (75 mg total) by mouth daily as needed. 8 tablet 11   SUMAtriptan  (IMITREX ) 6 MG/0.5ML SOLN injection Inject 0.5 mLs (6 mg total) into the skin See admin instructions. INJECT 6 MG (0.5 ml) INTO THE SKIN AS NEEDED FOR MIGRAINE OR HEADACHE. MAY REPEAT ONCE IN  2 HOURS IF HEADACHE PERSISTS OR RECURS. MAX 2 DOSES IN 24 HOURS. (Patient not taking: Reported on 10/25/2023) 3 mL 5   thiothixene (NAVANE) 10 MG capsule Take 20 mg by mouth at bedtime.     tiZANidine  (ZANAFLEX ) 4 MG tablet Take 1 tablet (4 mg total) by mouth every 6 (six) hours as needed for muscle spasms. 30 tablet 5   No facility-administered medications prior to visit.    PAST MEDICAL HISTORY: Past Medical History:  Diagnosis Date   Allergy    Anxiety    Bipolar affective psychosis (HCC)    Complication of anesthesia     Acting silly- waving at everyone-Giddy   Depression    Emphysema lung (HCC)    inhaler prn,  followed by pcp and pulmonologist-- dr frances   Family history of adverse reaction to anesthesia    Father had rheumatic fever, and died on operating table.   GERD (gastroesophageal reflux disease)    Kidney cysts 01/24/2020   Right Kidney   Migraine    Moderate persistent asthma 09/05/2021   Sleep apnea    Smokers' cough (HCC)    per pt not productive   SUI (stress urinary incontinence, female)    Tubular adenoma of colon 07/08/2020   Wears glasses     PAST SURGICAL HISTORY: Past Surgical History:  Procedure Laterality Date   ABDOMINAL HYSTERECTOMY N/A    Phreesia 11/30/2019    bladder stimulator     CARPAL TUNNEL RELEASE Right 09-12-2007   @[facility]    and GANGLION CYST EXCISION   COLONOSCOPY     CYSTOSCOPY N/A 04/29/2019   Procedure: CYSTOSCOPY;  Surgeon: Arnaldo Purchase, MD;  Location: [facility];  Service: Gynecology;  Laterality: N/A;   DILATATION & CURETTAGE/HYSTEROSCOPY WITH MYOSURE N/A 11/13/2018   Procedure: DILATATION & CURETTAGE/HYSTEROSCOPY WITH MYOSURE;  Surgeon: Rockney Evalene SQUIBB, MD;  Location: [facility];  Service: Gynecology;  Laterality: N/A;  request 9:00am OR start time in [place] [facility] block requests one hour   DILATION AND CURETTAGE OF UTERUS  02/2019   EYE SURGERY N/A    Phreesia 11/30/2019   GANGLION CYST EXCISION     R wrist   GREAT TOE ARTHRODESIS, JONES PROCEDURE     hammer toe 2024   interstem      implant for overactive bladder   KNEE ARTHROSCOPY WITH ANTERIOR CRUCIATE LIGAMENT (ACL) REPAIR Right 10/2015   LAPAROSCOPIC CHOLECYSTECTOMY  1997   LAPAROSCOPIC HYSTERECTOMY Bilateral 04/29/2019   Procedure: HYSTERECTOMY TOTAL LAPAROSCOPIC, BILATERAL SALPINO-OOPHORECTOMY;  Surgeon: Arnaldo Purchase, MD;  Location: [facility];  Service: Gynecology;  Laterality: Bilateral;   LAPAROSCOPIC TOTAL HYSTERECTOMY     TOOTH EXTRACTION  04/01/2019   tooth removal Bilateral 2022   4 teeth removed   UPPER GASTROINTESTINAL ENDOSCOPY      FAMILY HISTORY: Family History  Problem Relation Age of Onset   Depression Mother    Cancer Mother        Cervical   Alcohol abuse Father    Crohn's disease Sister    Stroke Brother 42   Migraines Daughter    GER disease Daughter    Depression Daughter    Bipolar disorder Daughter    Migraines Daughter    GER disease Daughter    Colon cancer Neg Hx    Rectal cancer Neg Hx    Stomach cancer Neg Hx     SOCIAL HISTORY: Social History   Socioeconomic History   Marital status: Divorced  Spouse name: n/a   Number of children: 2   Years of education:  63   Highest education level: Master's degree (e.g., MA, MS, MEng, MEd, MSW, MBA)  Occupational History   Occupation: Disabled    Comment: Formerly a runner, broadcasting/film/video.  Tobacco Use   Smoking status: Former    Current packs/day: 0.00    Average packs/day: 1 pack/day for 39.8 years (39.8 ttl pk-yrs)    Types: Cigarettes    Start date: 10/10/1981    Quit date: 07/18/2021    Years since quitting: 2.4    Passive exposure: Never   Smokeless tobacco: Never  Vaping Use   Vaping status: Never Used  Substance and Sexual Activity   Alcohol use: Never    Alcohol/week: 0.0 standard drinks of alcohol   Drug use: Never   Sexual activity: Not Currently    Partners: Male    Birth control/protection: Post-menopausal    Comment: menarche 53yo, 1st intercourse 53 yo-More than 5 partners  Other Topics Concern   Not on file  Social History Narrative   Lives at home with 1 of her two daughters   Right-handed.   2-4 cups caffeine daily.   Disabled    One story home   Social Drivers of Health   Financial Resource Strain: Low Risk  (08/29/2023)   Overall Financial Resource Strain (CARDIA)    Difficulty of Paying Living Expenses: Not very hard  Food Insecurity: Food Insecurity Present (08/29/2023)   Hunger Vital Sign    Worried About Running Out of Food in the Last Year: Sometimes true    Ran Out of Food in the Last Year: Sometimes true  Transportation Needs: No Transportation Needs (08/29/2023)   PRAPARE - Administrator, Civil Service (Medical): No    Lack of Transportation (Non-Medical): No  Physical Activity: Inactive (08/29/2023)   Exercise Vital Sign    Days of Exercise per Week: 0 days    Minutes of Exercise per Session: Not on file  Stress: Stress Concern Present (08/29/2023)   [employer] of Occupational Health - Occupational Stress Questionnaire    Feeling of Stress: Very much  Social Connections: Socially Isolated (08/29/2023)   Social Connection and Isolation Panel    Frequency of  Communication with Friends and Family: More than three times a week    Frequency of Social Gatherings with Friends and Family: Once a week    Attends Religious Services: Never    Database Administrator or Organizations: No    Attends Engineer, Structural: Not on file    Marital Status: Divorced  Intimate Partner Violence: Not At Risk (03/02/2023)   Humiliation, Afraid, Rape, and Kick questionnaire    Fear of Current or Ex-Partner: No    Emotionally Abused: No    Physically Abused: No    Sexually Abused: No     PHYSICAL EXAM  There were no vitals filed for this visit.    There is no height or weight on file to calculate BMI.  Generalized: Well developed, in no acute distress  Cardiology: normal rate and rhythm, no murmur noted Respiratory: clear to auscultation bilaterally  Neurological examination  Mentation: Alert oriented to time, place, history taking. Follows all commands speech and language fluent Cranial nerve II-XII: Pupils were equal round reactive to light. Extraocular movements were full, visual field were full  Motor: The motor testing reveals 5 over 5 strength of all 4 extremities. Good symmetric motor tone is noted throughout.  Gait  and station: Gait is normal.    DIAGNOSTIC DATA (LABS, IMAGING, TESTING) - I reviewed patient records, labs, notes, testing and imaging myself where available.      No data to display           Lab Results  Component Value Date   WBC 7.6 01/18/2023   HGB 11.2 (L) 01/18/2023   HCT 34.8 (L) 01/18/2023   MCV 83.1 01/18/2023   PLT 283 01/18/2023      Component Value Date/Time   NA 141 08/30/2023 0948   NA 143 03/24/2019 1510   K 4.4 08/30/2023 0948   CL 98 08/30/2023 0948   CO2 25 08/30/2023 0948   GLUCOSE 83 08/30/2023 0948   BUN 16 08/30/2023 0948   BUN 10 03/24/2019 1510   CREATININE 1.08 08/30/2023 0948   CREATININE 1.12 (H) 08/17/2015 1608   CALCIUM 9.5 08/30/2023 0948   PROT 6.9 08/30/2023 0948   PROT  6.9 03/24/2019 1510   ALBUMIN  4.4 08/30/2023 0948   ALBUMIN  4.4 03/24/2019 1510   AST 14 08/30/2023 0948   ALT 11 08/30/2023 0948   ALKPHOS 55 08/30/2023 0948   BILITOT 0.4 08/30/2023 0948   BILITOT <0.2 03/24/2019 1510   GFRNONAA >60 01/18/2023 1825   GFRAA 71 03/24/2019 1510   Lab Results  Component Value Date   CHOL 184 08/30/2023   HDL 46.30 08/30/2023   LDLCALC 109 (H) 08/30/2023   TRIG 145.0 08/30/2023   CHOLHDL 4 08/30/2023   Lab Results  Component Value Date   HGBA1C 5.3 08/30/2023   No results found for: VITAMINB12 Lab Results  Component Value Date   TSH 1.33 07/07/2022     ASSESSMENT AND PLAN 53 y.o. year old female  has a past medical history of Allergy, Anxiety, Bipolar affective psychosis (HCC), Complication of anesthesia, Depression, Emphysema lung (HCC), Family history of adverse reaction to anesthesia, GERD (gastroesophageal reflux disease), Kidney cysts (01/24/2020), Migraine, Moderate persistent asthma (09/05/2021), Sleep apnea, Smokers' cough (HCC), SUI (stress urinary incontinence, female), Tubular adenoma of colon (07/08/2020), and Wears glasses. here with   No diagnosis found.   SHARDAE KLEINMAN is doing well on CPAP therapy. Compliance report reveals optimal daily and sub optimal 4 hour compliance. She was encouraged to continue using CPAP nightly and for greater than 4 hours each night. We will update supply orders as indicated. Risks of untreated sleep apnea review and education materials provided. Healthy lifestyle habits encouraged. She will follow up in 1 year, sooner if needed. She verbalizes understanding and agreement with this plan.    No orders of the defined types were placed in this encounter.     No orders of the defined types were placed in this encounter.      Greig Forbes, FNP-C 12/17/2023, 11:31 AM [facility] [address] [place], [place] [address] [phone]

## 2023-12-19 ENCOUNTER — Ambulatory Visit (INDEPENDENT_AMBULATORY_CARE_PROVIDER_SITE_OTHER): Payer: 59 | Admitting: Family Medicine

## 2023-12-19 ENCOUNTER — Encounter: Payer: Self-pay | Admitting: Family Medicine

## 2023-12-19 VITALS — BP 102/69 | HR 76 | Ht 63.0 in | Wt 176.0 lb

## 2023-12-19 DIAGNOSIS — G43109 Migraine with aura, not intractable, without status migrainosus: Secondary | ICD-10-CM | POA: Diagnosis not present

## 2023-12-19 DIAGNOSIS — G4733 Obstructive sleep apnea (adult) (pediatric): Secondary | ICD-10-CM | POA: Diagnosis not present

## 2023-12-19 DIAGNOSIS — Z789 Other specified health status: Secondary | ICD-10-CM | POA: Diagnosis not present

## 2023-12-28 ENCOUNTER — Ambulatory Visit

## 2023-12-28 ENCOUNTER — Encounter: Payer: Self-pay | Admitting: Family Medicine

## 2023-12-28 ENCOUNTER — Ambulatory Visit: Admitting: Family Medicine

## 2023-12-28 VITALS — BP 106/70 | HR 69 | Temp 97.9°F | Resp 16 | Ht 63.0 in | Wt 178.0 lb

## 2023-12-28 DIAGNOSIS — H9202 Otalgia, left ear: Secondary | ICD-10-CM

## 2023-12-28 DIAGNOSIS — H6121 Impacted cerumen, right ear: Secondary | ICD-10-CM

## 2023-12-28 DIAGNOSIS — J019 Acute sinusitis, unspecified: Secondary | ICD-10-CM

## 2023-12-28 DIAGNOSIS — H9193 Unspecified hearing loss, bilateral: Secondary | ICD-10-CM

## 2023-12-28 MED ORDER — AMOXICILLIN-POT CLAVULANATE 875-125 MG PO TABS: 1.0000 | ORAL_TABLET | Freq: Two times a day (BID) | ORAL | 0 refills | Status: AC

## 2023-12-28 NOTE — Progress Notes (Signed)
 Subjective:  Patient ID: Lauren Chapman, female    DOB: 11/06/1970  Age: 53 y.o. MRN: [id number]  CC:  Chief Complaint  Patient presents with   Ear Fullness    Painful. Sinus cavity on left side is hurting her. Left ear starting hurting this past Monday.   Nasal Congestion    Runny nose, sneezing. Coughing. Sx started 2 weeks ago. Has taken OTC zyrtec no relief.    Acute Visit    Patient needs a letter to financial aid - she needs it to say that she is able and capable of gainful employment. The letter has to be from you.     HPI Lauren Chapman presents for   Acute visit for multiple concerns above.  Nasal congestion, runny nose, sneezing, ear fullness Initial symptoms started approximately 2 weeks ago with cough, sneezing, runny nose.  Minimal improvement with over-the-counter Zyrtec.  Left ear pain started approximately 4 days ago, along with sinus pain on left. Yellow nasal d/c.  No fever recently, just initially.  Tx: otc sinus med, zyrtec.  Grandkids sick, cold sx's.    Needs letter for financial aid. Student loans were discharged. In order to get new financial aid, needs letter that state she can be employed. Currently on disability due to mental health. Will be getting a job next week. Disability was for mental health. Still needs disability but able to work part time at [employer] for 2.5 years. 20-25 hours.  Followed by psychiatry, prior Dr. Jess. Now followed by Vernard Batter, NP - [facility].   Hearing difficulty -past 6 months, both ears, turning up tv louder.  No prior hearing test. Would like to meet with audiologist.    History Patient Active Problem List   Diagnosis Date Noted   Former smoker 09/27/2023   Primary osteoarthritis of right hip 09/14/2023   Morbid obesity (HCC) 05/22/2023   Schizoaffective disorder (HCC) 03/01/2023   Asthma 02/15/2023   Intractable chronic migraine with aura with status migrainosus 02/15/2023   Overactive bladder 02/15/2023    Poorly controlled mild persistent asthma 09/05/2021   Syncope 08/02/2021   CAP (community acquired pneumonia) 07/18/2021   OSA (obstructive sleep apnea) 07/18/2021   Hypokalemia 07/18/2021   Depression 03/24/2020   Left lumbar radiculopathy 02/25/2020   Absence of bladder continence 02/25/2020   History of postmenopausal bleeding 04/29/2019   Polypharmacy 04/03/2019   Acute low back pain 07/31/2018   Emphysema, unspecified (HCC) 05/17/2018   Weakness 01/25/2017   Chronic migraine w/o aura w/o status migrainosus, not intractable 12/22/2014   Encounter for other general counseling or advice on contraception 09/02/2014   Bipolar disorder (HCC) 10/12/2011   Anxiety 10/12/2011   GERD (gastroesophageal reflux disease) 10/12/2011   Past Medical History:  Diagnosis Date   Allergy    Anxiety    Bipolar affective psychosis (HCC)    Complication of anesthesia     Acting silly- waving at everyone-Giddy   Depression    Emphysema lung (HCC)    inhaler prn,  followed by pcp and pulmonologist-- dr frances   Family history of adverse reaction to anesthesia    Father had rheumatic fever, and died on operating table.   GERD (gastroesophageal reflux disease)    Kidney cysts 01/24/2020   Right Kidney   Migraine    Moderate persistent asthma 09/05/2021   Sleep apnea    Smokers' cough (HCC)    per pt not productive   SUI (stress urinary incontinence, female)  Tubular adenoma of colon 07/08/2020   Wears glasses    Past Surgical History:  Procedure Laterality Date   ABDOMINAL HYSTERECTOMY N/A    Phreesia 11/30/2019   bladder stimulator     CARPAL TUNNEL RELEASE Right 09-12-2007   @[facility]    and GANGLION CYST EXCISION   COLONOSCOPY     CYSTOSCOPY N/A 04/29/2019   Procedure: CYSTOSCOPY;  Surgeon: Arnaldo Purchase, MD;  Location: [facility];  Service: Gynecology;  Laterality: N/A;   DILATATION & CURETTAGE/HYSTEROSCOPY WITH MYOSURE N/A 11/13/2018   Procedure: DILATATION  & CURETTAGE/HYSTEROSCOPY WITH MYOSURE;  Surgeon: Rockney Evalene SQUIBB, MD;  Location: [facility];  Service: Gynecology;  Laterality: N/A;  request 9:00am OR start time in [place] [facility] block requests one hour   DILATION AND CURETTAGE OF UTERUS  02/2019   EYE SURGERY N/A    Phreesia 11/30/2019   GANGLION CYST EXCISION     R wrist   GREAT TOE ARTHRODESIS, JONES PROCEDURE     hammer toe 2024   interstem      implant for overactive bladder   KNEE ARTHROSCOPY WITH ANTERIOR CRUCIATE LIGAMENT (ACL) REPAIR Right 10/2015   LAPAROSCOPIC CHOLECYSTECTOMY  1997   LAPAROSCOPIC HYSTERECTOMY Bilateral 04/29/2019   Procedure: HYSTERECTOMY TOTAL LAPAROSCOPIC, BILATERAL SALPINO-OOPHORECTOMY;  Surgeon: Arnaldo Purchase, MD;  Location: [facility];  Service: Gynecology;  Laterality: Bilateral;   LAPAROSCOPIC TOTAL HYSTERECTOMY     TOOTH EXTRACTION  04/01/2019   tooth removal Bilateral 2022   4 teeth removed   UPPER GASTROINTESTINAL ENDOSCOPY     Allergies  Allergen Reactions   Codeine Anaphylaxis and Other (See Comments)    Cannot have ANYTHING with codeine   Prior to Admission medications   Medication Sig Start Date End Date Taking? Authorizing Provider  albuterol  (VENTOLIN  HFA) 108 (90 Base) MCG/ACT inhaler Inhale 2 puffs into the lungs every 4 (four) hours as needed for wheezing or shortness of breath. 07/18/22  Yes Cobb, Comer GAILS, NP  budesonide -formoterol  (SYMBICORT ) 160-4.5 MCG/ACT inhaler Inhale 2 puffs into the lungs 2 (two) times daily. 09/27/23  Yes Cobb, Comer GAILS, NP  busPIRone  (BUSPAR ) 15 MG tablet Take 1 tablet (15 mg total) by mouth 3 (three) times daily. 03/09/23  Yes Ezzard Staci SAILOR, NP  citalopram  (CELEXA ) 40 MG tablet Take 1 tablet (40 mg total) by mouth daily. 04/16/23 12/28/23 Yes Lynnette Barter, MD  DEPAKOTE 125 MG DR tablet Take 125 mg by mouth 3 (three) times daily. 06/13/23  Yes [provider]  EPINEPHrine  0.3 mg/0.3 mL IJ SOAJ injection Inject  0.3 mg into the muscle as needed for anaphylaxis. 03/29/21  Yes Ruthell Lonni FALCON, PA-C  esomeprazole  (NEXIUM ) 20 MG capsule Take 1 capsule (20 mg total) by mouth 2 (two) times daily before a meal. Patient taking differently: Take 20 mg by mouth daily at 12 noon. 03/21/19  Yes Melonie Colonel, Mikel HERO, MD  gabapentin  (NEURONTIN ) 400 MG capsule Take 2 capsules (800 mg total) by mouth 3 (three) times daily. 04/02/23  Yes Lynnette Barter, MD  metoprolol tartrate (LOPRESSOR) 25 MG tablet Take 25 mg by mouth 2 (two) times daily. Patient taking differently: Take 25 mg by mouth daily.   Yes [provider]  MYRBETRIQ  50 MG TB24 tablet Take 50 mg by mouth daily. 11/11/21  Yes [provider]  ondansetron  (ZOFRAN ) 4 MG tablet Take 1 tablet (4 mg total) by mouth every 8 (eight) hours as needed for nausea or vomiting. 01/05/20  Yes Gayland Lauraine PARAS,  NP  ramelteon (ROZEREM) 8 MG tablet Take 8 mg by mouth at bedtime. 08/14/23  Yes [provider]  Rimegepant Sulfate (NURTEC) 75 MG TBDP Take 1 tablet (75 mg total) by mouth daily as needed. 09/18/22  Yes Skeet, Adam R, DO  thiothixene (NAVANE) 10 MG capsule Take 20 mg by mouth at bedtime. 04/06/23  Yes [provider]  tiZANidine  (ZANAFLEX ) 4 MG tablet Take 1 tablet (4 mg total) by mouth every 6 (six) hours as needed for muscle spasms. 04/19/23  Yes Jerrell Cleatus Ned, MD  SUMAtriptan  (IMITREX ) 6 MG/0.5ML SOLN injection Inject 0.5 mLs (6 mg total) into the skin See admin instructions. INJECT 6 MG (0.5 ml) INTO THE SKIN AS NEEDED FOR MIGRAINE OR HEADACHE. MAY REPEAT ONCE IN 2 HOURS IF HEADACHE PERSISTS OR RECURS. MAX 2 DOSES IN 24 HOURS. Patient not taking: Reported on 12/28/2023 02/17/22   Skeet Juliene SAUNDERS, DO   Social History   Socioeconomic History   Marital status: Divorced    Spouse name: n/a   Number of children: 2   Years of education: 18   Highest education level: Master's degree (e.g., MA, MS, MEng, MEd, MSW, MBA)  Occupational  History   Occupation: Disabled    Comment: Formerly a runner, broadcasting/film/video.  Tobacco Use   Smoking status: Former    Current packs/day: 0.00    Average packs/day: 1 pack/day for 39.8 years (39.8 ttl pk-yrs)    Types: Cigarettes    Start date: 10/10/1981    Quit date: 07/18/2021    Years since quitting: 2.4    Passive exposure: Never   Smokeless tobacco: Never  Vaping Use   Vaping status: Never Used  Substance and Sexual Activity   Alcohol use: Never    Alcohol/week: 0.0 standard drinks of alcohol   Drug use: Never   Sexual activity: Not Currently    Partners: Male    Birth control/protection: Post-menopausal    Comment: menarche 53yo, 1st intercourse 53 yo-More than 5 partners  Other Topics Concern   Not on file  Social History Narrative   Lives at home with 1 of her two daughters   Right-handed.   2-4 cups caffeine daily.   Disabled    One story home   Social Drivers of Health   Financial Resource Strain: Low Risk  (08/29/2023)   Overall Financial Resource Strain (CARDIA)    Difficulty of Paying Living Expenses: Not very hard  Food Insecurity: Food Insecurity Present (08/29/2023)   Hunger Vital Sign    Worried About Running Out of Food in the Last Year: Sometimes true    Ran Out of Food in the Last Year: Sometimes true  Transportation Needs: No Transportation Needs (08/29/2023)   PRAPARE - Administrator, Civil Service (Medical): No    Lack of Transportation (Non-Medical): No  Physical Activity: Inactive (08/29/2023)   Exercise Vital Sign    Days of Exercise per Week: 0 days    Minutes of Exercise per Session: Not on file  Stress: Stress Concern Present (08/29/2023)   [employer] of Occupational Health - Occupational Stress Questionnaire    Feeling of Stress: Very much  Social Connections: Socially Isolated (08/29/2023)   Social Connection and Isolation Panel    Frequency of Communication with Friends and Family: More than three times a week    Frequency of Social  Gatherings with Friends and Family: Once a week    Attends Religious Services: Never    Database Administrator or  Organizations: No    Attends Banker Meetings: Not on file    Marital Status: Divorced  Intimate Partner Violence: Not At Risk (03/02/2023)   Humiliation, Afraid, Rape, and Kick questionnaire    Fear of Current or Ex-Partner: No    Emotionally Abused: No    Physically Abused: No    Sexually Abused: No    Review of Systems   Objective:   Vitals:   12/28/23 0829  BP: 106/70  Pulse: 69  Resp: 16  Temp: 97.9 F (36.6 C)  TempSrc: Temporal  SpO2: 99%  Weight: 178 lb (80.7 kg)  Height: 5' 3 (1.6 m)     Physical Exam Vitals reviewed.  Constitutional:      General: She is not in acute distress.    Appearance: She is well-developed.  HENT:     Head: Normocephalic and atraumatic.     Right Ear: Hearing and external ear normal.     Left Ear: Hearing, ear canal and external ear normal.     Ears:     Comments: Unable to completely visualize right TM, cerumen within canal but not completely obstructing.  On left canal, no edema, no exudate.  Slightly injected TM without effusion.    Nose: Nose normal.     Comments: Left frontal and maxillary sinuses tender to percussion.    Mouth/Throat:     Pharynx: No posterior oropharyngeal erythema.  Eyes:     Conjunctiva/sclera: Conjunctivae normal.     Pupils: Pupils are equal, round, and reactive to light.  Cardiovascular:     Rate and Rhythm: Normal rate and regular rhythm.     Heart sounds: Normal heart sounds. No murmur heard. Pulmonary:     Effort: Pulmonary effort is normal. No respiratory distress.     Breath sounds: Normal breath sounds. No wheezing or rhonchi.  Skin:    General: Skin is warm and dry.     Findings: No rash.  Neurological:     Mental Status: She is alert and oriented to person, place, and time.  Psychiatric:        Mood and Affect: Mood normal.        Behavior: Behavior normal.      Assessment & Plan:  Lauren Chapman is a 53 y.o. female . Acute sinusitis, recurrence not specified, unspecified location - Plan: amoxicillin -clavulanate (AUGMENTIN ) 875-125 MG tablet  - Suspect initial viral illness with secondary sinusitis, left-sided frontal, maxillary and likely referred pain to the ear versus OME.  Start Augmentin , symptomatic care discussed.  RTC precautions given.  Left ear pain - Plan: amoxicillin -clavulanate (AUGMENTIN ) 875-125 MG tablet  - As above, likely referred pain versus OME, no sign of acute otitis media.  Hearing difficulty of both ears - Plan: Ambulatory referral to Audiology  - New concern, ongoing symptoms as above, bilateral ears, unlikely due to just the excess cerumen in the right canal.  Will refer to audiology.  Excessive cerumen in ear canal, right  - Debrox over-the-counter as option, handout given, option of lavage in office if needed.   In regards to the letter for financial aid, since she is currently on disability for psychiatric reasons, and followed by psychiatrist, asked that she have them complete a letter and any specific restrictions or guidance for her ability to work.  I can also complete letter if needed but would need guidance from her mental health provider given current reason for disability.  Understanding expressed.  Meds ordered this encounter  Medications  amoxicillin -clavulanate (AUGMENTIN ) 875-125 MG tablet    Sig: Take 1 tablet by mouth 2 (two) times daily for 10 days.    Dispense:  20 tablet    Refill:  0   Patient Instructions  Start Augmentin  for suspected sinus infection.  I do not see an obvious ear infection at this time.  I think some of the ear pain is related to the pressure and congestion in his sinuses, so that should improve with treatment for the sinuses.  Saline nasal spray throughout the day is okay to use as needed, Mucinex  if needed for cough or congestion.  Follow-up if not improving.  I will  refer you to audiology.  There is some earwax in the right canal, you can try over-the-counter Debrox initially.  If that is ineffective, we can perform a lavage for cerumen/earwax in the office.  Let us  know.  See information below.  In regards to the letter for your financial aid, since you are currently on disability and followed by psychiatry for mental health, I would like them to comment and provide letter regarding any specific recommendations on ability to work or restrictions.  If you are followed by a nurse practitioner or PA, they should have a supervising physician that can sign off on that letter if needed.  If I need to complete a letter, I will need specific guidance from your mental health provider regarding that letter.  Again,  it would be best for them to complete that letter since they are following you for the reason of current disability status.  Please let me know if there are questions regarding this and happy to help further if needed.  Take care!  Earwax Buildup, Adult Your ears make something called earwax. It helps keep germs called bacteria away and protects the skin in your ears. Sometimes, too much earwax can build up. This can cause discomfort or make it harder to hear. What are the causes? Earwax buildup can happen when you have too much earwax in your ears. Earwax is made in the outer part of your ear canal. It's supposed to fall out in small amounts over time. But if your ears aren't able to clean themselves like they should, earwax can build up. What increases the risk? You're more likely to get earwax buildup if: You clean your ears with cotton swabs. You pick at your ears. You use earplugs or in-ear headphones a lot. You wear hearing aids. You may also be more likely to get it if: You're female. You're older. Your ears naturally make more earwax. You have narrow ear canals or extra hair in your ears. Your earwax is too thick or sticky. You have eczema. You're  dehydrated. This means there's not enough fluid in your body. What are the signs or symptoms? Symptoms of earwax buildup include: Not being able to hear as well. A feeling of fullness in your ear. Feeling like your ear is plugged. Fluid coming from your ear. Ear pain or an itchy ear. Ringing in your ear. Coughing or problems with balance. How is this diagnosed? Earwax buildup may be diagnosed based on your symptoms, medical history, and an ear exam. During the exam, your health care provider will look into your ear with a tool called an otoscope. You may also have tests, such as a hearing test. How is this treated? Earwax buildup may be treated by: Using ear drops. Having the earwax removed by a provider. The provider may: Flush the ear with water.  Use a tool called a curette that has a loop on the end. Use a suction device. Having surgery. This may be done in severe cases. Follow these instructions at home:  Cleaning your ears Clean your ears as told by your provider. You can clean the outside of your ears with a washcloth or tissue. Do not overclean your ears. Do not put anything into your ear unless told. This includes cotton swabs. General instructions Take over-the-counter and prescription medicines only as told by your provider. Drink enough fluid to keep your pee (urine) pale yellow. This helps thin the earwax. If you have hearing aids, clean them as told. Keep all follow-up visits. If earwax builds up in your ears often or if you use hearing aids, ask your provider how often you should have your ears cleaned. Contact a health care provider if: Your ear pain gets worse. You have a fever. You have pus, blood, or other fluid coming from your ear. You have hearing loss. You have ringing in your ears that won't go away. You feel like the room is spinning. This is called vertigo. Your symptoms don't get better with treatment. This information is not intended to replace  advice given to you by your health care provider. Make sure you discuss any questions you have with your health care provider. Document Revised: 03/23/2022 Document Reviewed: 03/23/2022 Elsevier Patient Education  2024 Elsevier Inc.  Sinus Infection, Adult A sinus infection, also called sinusitis, is inflammation of your sinuses. Sinuses are hollow spaces in the bones around your face. Your sinuses are located: Around your eyes. In the middle of your forehead. Behind your nose. In your cheekbones. Mucus normally drains out of your sinuses. When your nasal tissues become inflamed or swollen, mucus can become trapped or blocked. This allows bacteria, viruses, and fungi to grow, which leads to infection. Most infections of the sinuses are caused by a virus. A sinus infection can develop quickly. It can last for up to 4 weeks (acute) or for more than 12 weeks (chronic). A sinus infection often develops after a cold. What are the causes? This condition is caused by anything that creates swelling in the sinuses or stops mucus from draining. This includes: Allergies. Asthma. Infection from bacteria or viruses. Deformities or blockages in your nose or sinuses. Abnormal growths in the nose (nasal polyps). Pollutants, such as chemicals or irritants in the air. Infection from fungi. This is rare. What increases the risk? You are more likely to develop this condition if you: Have a weak body defense system (immune system). Do a lot of swimming or diving. Overuse nasal sprays. Smoke. What are the signs or symptoms? The main symptoms of this condition are pain and a feeling of pressure around the affected sinuses. Other symptoms include: Stuffy nose or congestion that makes it difficult to breathe through your nose. Thick yellow or greenish drainage from your nose. Tenderness, swelling, and warmth over the affected sinuses. A cough that may get worse at night. Decreased sense of smell and  taste. Extra mucus that collects in the throat or the back of the nose (postnasal drip) causing a sore throat or bad breath. Tiredness (fatigue). Fever. How is this diagnosed? This condition is diagnosed based on: Your symptoms. Your medical history. A physical exam. Tests to find out if your condition is acute or chronic. This may include: Checking your nose for nasal polyps. Viewing your sinuses using a device that has a light (endoscope). Testing for allergies or bacteria.  Imaging tests, such as an MRI or CT scan. In rare cases, a bone biopsy may be done to rule out more serious types of fungal sinus disease. How is this treated? Treatment for a sinus infection depends on the cause and whether your condition is chronic or acute. If caused by a virus, your symptoms should go away on their own within 10 days. You may be given medicines to relieve symptoms. They include: Medicines that shrink swollen nasal passages (decongestants). A spray that eases inflammation of the nostrils (topical intranasal corticosteroids). Rinses that help get rid of thick mucus in your nose (nasal saline washes). Medicines that treat allergies (antihistamines). Over-the-counter pain relievers. If caused by bacteria, your health care provider may recommend waiting to see if your symptoms improve. Most bacterial infections will get better without antibiotic medicine. You may be given antibiotics if you have: A severe infection. A weak immune system. If caused by narrow nasal passages or nasal polyps, surgery may be needed. Follow these instructions at home: Medicines Take, use, or apply over-the-counter and prescription medicines only as told by your health care provider. These may include nasal sprays. If you were prescribed an antibiotic medicine, take it as told by your health care provider. Do not stop taking the antibiotic even if you start to feel better. Hydrate and humidify  Drink enough fluid to  keep your urine pale yellow. Staying hydrated will help to thin your mucus. Use a cool mist humidifier to keep the humidity level in your home above 50%. Inhale steam for 10-15 minutes, 3-4 times a day, or as told by your health care provider. You can do this in the bathroom while a hot shower is running. Limit your exposure to cool or dry air. Rest Rest as much as possible. Sleep with your head raised (elevated). Make sure you get enough sleep each night. General instructions  Apply a warm, moist washcloth to your face 3-4 times a day or as told by your health care provider. This will help with discomfort. Use nasal saline washes as often as told by your health care provider. Wash your hands often with soap and water to reduce your exposure to germs. If soap and water are not available, use hand sanitizer. Do not smoke. Avoid being around people who are smoking (secondhand smoke). Keep all follow-up visits. This is important. Contact a health care provider if: You have a fever. Your symptoms get worse. Your symptoms do not improve within 10 days. Get help right away if: You have a severe headache. You have persistent vomiting. You have severe pain or swelling around your face or eyes. You have vision problems. You develop confusion. Your neck is stiff. You have trouble breathing. These symptoms may be an emergency. Get help right away. Call 911. Do not wait to see if the symptoms will go away. Do not drive yourself to the hospital. Summary A sinus infection is soreness and inflammation of your sinuses. Sinuses are hollow spaces in the bones around your face. This condition is caused by nasal tissues that become inflamed or swollen. The swelling traps or blocks the flow of mucus. This allows bacteria, viruses, and fungi to grow, which leads to infection. If you were prescribed an antibiotic medicine, take it as told by your health care provider. Do not stop taking the antibiotic even  if you start to feel better. Keep all follow-up visits. This is important. This information is not intended to replace advice given to you by your  health care provider. Make sure you discuss any questions you have with your health care provider. Document Revised: 12/14/2020 Document Reviewed: 12/14/2020 Elsevier Patient Education  2024 Elsevier Inc.    Signed,   Reyes Pines, MD  Primary Care, [facility] 12/28/23 9:13 AM

## 2023-12-28 NOTE — Patient Instructions (Signed)
 Start Augmentin  for suspected sinus infection.  I do not see an obvious ear infection at this time.  I think some of the ear pain is related to the pressure and congestion in his sinuses, so that should improve with treatment for the sinuses.  Saline nasal spray throughout the day is okay to use as needed, Mucinex  if needed for cough or congestion.  Follow-up if not improving.  I will refer you to audiology.  There is some earwax in the right canal, you can try over-the-counter Debrox initially.  If that is ineffective, we can perform a lavage for cerumen/earwax in the office.  Let us  know.  See information below.  In regards to the letter for your financial aid, since you are currently on disability and followed by psychiatry for mental health, I would like them to comment and provide letter regarding any specific recommendations on ability to work or restrictions.  If you are followed by a nurse practitioner or PA, they should have a supervising physician that can sign off on that letter if needed.  If I need to complete a letter, I will need specific guidance from your mental health provider regarding that letter.  Again,  it would be best for them to complete that letter since they are following you for the reason of current disability status.  Please let me know if there are questions regarding this and happy to help further if needed.  Take care!  Earwax Buildup, Adult Your ears make something called earwax. It helps keep germs called bacteria away and protects the skin in your ears. Sometimes, too much earwax can build up. This can cause discomfort or make it harder to hear. What are the causes? Earwax buildup can happen when you have too much earwax in your ears. Earwax is made in the outer part of your ear canal. It's supposed to fall out in small amounts over time. But if your ears aren't able to clean themselves like they should, earwax can build up. What increases the risk? You're more likely  to get earwax buildup if: You clean your ears with cotton swabs. You pick at your ears. You use earplugs or in-ear headphones a lot. You wear hearing aids. You may also be more likely to get it if: You're female. You're older. Your ears naturally make more earwax. You have narrow ear canals or extra hair in your ears. Your earwax is too thick or sticky. You have eczema. You're dehydrated. This means there's not enough fluid in your body. What are the signs or symptoms? Symptoms of earwax buildup include: Not being able to hear as well. A feeling of fullness in your ear. Feeling like your ear is plugged. Fluid coming from your ear. Ear pain or an itchy ear. Ringing in your ear. Coughing or problems with balance. How is this diagnosed? Earwax buildup may be diagnosed based on your symptoms, medical history, and an ear exam. During the exam, your health care provider will look into your ear with a tool called an otoscope. You may also have tests, such as a hearing test. How is this treated? Earwax buildup may be treated by: Using ear drops. Having the earwax removed by a provider. The provider may: Flush the ear with water. Use a tool called a curette that has a loop on the end. Use a suction device. Having surgery. This may be done in severe cases. Follow these instructions at home:  Cleaning your ears Clean your ears as told  by your provider. You can clean the outside of your ears with a washcloth or tissue. Do not overclean your ears. Do not put anything into your ear unless told. This includes cotton swabs. General instructions Take over-the-counter and prescription medicines only as told by your provider. Drink enough fluid to keep your pee (urine) pale yellow. This helps thin the earwax. If you have hearing aids, clean them as told. Keep all follow-up visits. If earwax builds up in your ears often or if you use hearing aids, ask your provider how often you should have your  ears cleaned. Contact a health care provider if: Your ear pain gets worse. You have a fever. You have pus, blood, or other fluid coming from your ear. You have hearing loss. You have ringing in your ears that won't go away. You feel like the room is spinning. This is called vertigo. Your symptoms don't get better with treatment. This information is not intended to replace advice given to you by your health care provider. Make sure you discuss any questions you have with your health care provider. Document Revised: 03/23/2022 Document Reviewed: 03/23/2022 Elsevier Patient Education  2024 [employer]vier Inc.  Sinus Infection, Adult A sinus infection, also called sinusitis, is inflammation of your sinuses. Sinuses are hollow spaces in the bones around your face. Your sinuses are located: Around your eyes. In the middle of your forehead. Behind your nose. In your cheekbones. Mucus normally drains out of your sinuses. When your nasal tissues become inflamed or swollen, mucus can become trapped or blocked. This allows bacteria, viruses, and fungi to grow, which leads to infection. Most infections of the sinuses are caused by a virus. A sinus infection can develop quickly. It can last for up to 4 weeks (acute) or for more than 12 weeks (chronic). A sinus infection often develops after a cold. What are the causes? This condition is caused by anything that creates swelling in the sinuses or stops mucus from draining. This includes: Allergies. Asthma. Infection from bacteria or viruses. Deformities or blockages in your nose or sinuses. Abnormal growths in the nose (nasal polyps). Pollutants, such as chemicals or irritants in the air. Infection from fungi. This is rare. What increases the risk? You are more likely to develop this condition if you: Have a weak body defense system (immune system). Do a lot of swimming or diving. Overuse nasal sprays. Smoke. What are the signs or symptoms? The main  symptoms of this condition are pain and a feeling of pressure around the affected sinuses. Other symptoms include: Stuffy nose or congestion that makes it difficult to breathe through your nose. Thick yellow or greenish drainage from your nose. Tenderness, swelling, and warmth over the affected sinuses. A cough that may get worse at night. Decreased sense of smell and taste. Extra mucus that collects in the throat or the back of the nose (postnasal drip) causing a sore throat or bad breath. Tiredness (fatigue). Fever. How is this diagnosed? This condition is diagnosed based on: Your symptoms. Your medical history. A physical exam. Tests to find out if your condition is acute or chronic. This may include: Checking your nose for nasal polyps. Viewing your sinuses using a device that has a light (endoscope). Testing for allergies or bacteria. Imaging tests, such as an MRI or CT scan. In rare cases, a bone biopsy may be done to rule out more serious types of fungal sinus disease. How is this treated? Treatment for a sinus infection depends on the  cause and whether your condition is chronic or acute. If caused by a virus, your symptoms should go away on their own within 10 days. You may be given medicines to relieve symptoms. They include: Medicines that shrink swollen nasal passages (decongestants). A spray that eases inflammation of the nostrils (topical intranasal corticosteroids). Rinses that help get rid of thick mucus in your nose (nasal saline washes). Medicines that treat allergies (antihistamines). Over-the-counter pain relievers. If caused by bacteria, your health care provider may recommend waiting to see if your symptoms improve. Most bacterial infections will get better without antibiotic medicine. You may be given antibiotics if you have: A severe infection. A weak immune system. If caused by narrow nasal passages or nasal polyps, surgery may be needed. Follow these  instructions at home: Medicines Take, use, or apply over-the-counter and prescription medicines only as told by your health care provider. These may include nasal sprays. If you were prescribed an antibiotic medicine, take it as told by your health care provider. Do not stop taking the antibiotic even if you start to feel better. Hydrate and humidify  Drink enough fluid to keep your urine pale yellow. Staying hydrated will help to thin your mucus. Use a cool mist humidifier to keep the humidity level in your home above 50%. Inhale steam for 10-15 minutes, 3-4 times a day, or as told by your health care provider. You can do this in the bathroom while a hot shower is running. Limit your exposure to cool or dry air. Rest Rest as much as possible. Sleep with your head raised (elevated). Make sure you get enough sleep each night. General instructions  Apply a warm, moist washcloth to your face 3-4 times a day or as told by your health care provider. This will help with discomfort. Use nasal saline washes as often as told by your health care provider. Wash your hands often with soap and water to reduce your exposure to germs. If soap and water are not available, use hand sanitizer. Do not smoke. Avoid being around people who are smoking (secondhand smoke). Keep all follow-up visits. This is important. Contact a health care provider if: You have a fever. Your symptoms get worse. Your symptoms do not improve within 10 days. Get help right away if: You have a severe headache. You have persistent vomiting. You have severe pain or swelling around your face or eyes. You have vision problems. You develop confusion. Your neck is stiff. You have trouble breathing. These symptoms may be an emergency. Get help right away. Call 911. Do not wait to see if the symptoms will go away. Do not drive yourself to the hospital. Summary A sinus infection is soreness and inflammation of your sinuses. Sinuses  are hollow spaces in the bones around your face. This condition is caused by nasal tissues that become inflamed or swollen. The swelling traps or blocks the flow of mucus. This allows bacteria, viruses, and fungi to grow, which leads to infection. If you were prescribed an antibiotic medicine, take it as told by your health care provider. Do not stop taking the antibiotic even if you start to feel better. Keep all follow-up visits. This is important. This information is not intended to replace advice given to you by your health care provider. Make sure you discuss any questions you have with your health care provider. Document Revised: 12/14/2020 Document Reviewed: 12/14/2020 Elsevier Patient Education  2024 [employer].

## 2024-01-02 ENCOUNTER — Ambulatory Visit

## 2024-01-02 VITALS — BP 97/62 | HR 64 | Temp 97.7°F | Resp 18 | Ht 63.0 in | Wt 175.0 lb

## 2024-01-02 DIAGNOSIS — G43709 Chronic migraine without aura, not intractable, without status migrainosus: Secondary | ICD-10-CM | POA: Diagnosis not present

## 2024-01-02 MED ORDER — SODIUM CHLORIDE 0.9 % IV SOLN
300.0000 mg | Freq: Once | INTRAVENOUS | Status: AC
  Administered 2024-01-02: 300 mg via INTRAVENOUS
  Filled 2024-01-02: qty 3

## 2024-01-02 NOTE — Progress Notes (Signed)
 Diagnosis: Migraines  Provider:  Praveen Mannam MD  Procedure: IV Infusion  IV Type: Peripheral, IV Location: L Forearm   Vyepti  (Eptinezumab -jjmr), Dose: 300 mg  Infusion Start Time: 0903  Infusion Stop Time: 0939  Post Infusion IV Care: Peripheral IV Discontinued  Discharge: Condition: Good, Destination: Home . AVS Declined  Performed by:  Maximiano JONELLE Pouch, LPN

## 2024-01-04 ENCOUNTER — Ambulatory Visit: Attending: Orthopaedic Surgery | Admitting: Audiologist

## 2024-01-04 DIAGNOSIS — H903 Sensorineural hearing loss, bilateral: Secondary | ICD-10-CM | POA: Insufficient documentation

## 2024-01-07 ENCOUNTER — Encounter: Payer: Self-pay | Admitting: Audiologist

## 2024-01-07 NOTE — Procedures (Signed)
°  Outpatient Audiology and [facility] [address] [place], [place]  [address] [phone]  AUDIOLOGICAL  EVALUATION  NAME: Lauren Chapman     DOB:   14-Dec-1970      MRN: [id number]                                                                                     DATE: 01/07/2024     REFERENT: Levora Reyes SAUNDERS, MD STATUS: Outpatient DIAGNOSIS: Sensorineural Hearing Loss    History: Lauren Chapman was seen for an audiological evaluation due to difficulty hearing people clearly. This has been slowly progressing for a long time. She is asking people to repeat themselves.  Lauren Chapman denies pain, pressure, or tinnitus.  Lauren Chapman has no significant history of hazardous noise exposure.  Medical history shows no additional risk for hearing loss.    Evaluation:  Otoscopy showed a partial view of the tympanic membranes, bilaterally. Cerumen present right ear.  Tympanometry results were consistent with normal middle ear function, bilaterally   Audiometric testing was completed using Conventional Audiometry techniques with insert earphones and supraural headphones. Test results are consistent with moderate sloping to severe sensorineural hearing loss bilaterally. Speech Recognition Thresholds were obtained at 55dB HL in the right ear and at 60dB HL in the left ear. Word Recognition Testing was completed at  40dB SL and Lauren Chapman scored 76% in the right ear and 84% in the left ear.    Results:  The test results were reviewed with Lauren Chapman. Lauren Chapman has moderate sloping to severe sensorineural hearing loss bilaterally. Lauren Chapman needs hearing aids for both ears. She has [employer] and can call their [employer] number to get setup with a hearing aid appointment.  Audiogram printed and provided to Lauren Chapman.    Recommendations: Hearing aids recommended for both ears. Lauren Chapman given copies of her audiogram and handout on [facility] Hearing program.    30 minutes spent testing and counseling  on results.   If you have any questions please feel free to contact me at [phone].  Lauren Chapman Au.D.  Audiologist   01/07/2024  12:53 PM  Cc: Levora Reyes SAUNDERS, MD

## 2024-01-09 ENCOUNTER — Encounter: Payer: Self-pay | Admitting: Neurology

## 2024-01-11 NOTE — Progress Notes (Unsigned)
 "  NEUROLOGY FOLLOW UP OFFICE NOTE  Lauren Chapman [id number]  Assessment/Plan:     Migraine with aura, without status migrainosus, not intractable    Migraine prevention:  Vyepti  300mg  Q26m  Migraine rescue:  Nurtec.  May take with Zofran  and tizanidine .  Sumatriptan  60mg  [place] second line.  Magnesium  citrate 400mg  qd, riboflavin 400mg  qd, CoQ-10 100mg  TID  Diet/caffeine cessation/exercise Continue use of CPAP.  Followed by sleep clinic at [facility] Keep headache diary Follow up 6 months.       Subjective:  Lauren Chapman is a 53 year old right-handed female with emphysema, Bipolar disorder, depression, anxiety, OSA and history of tubular adenoma of the colon who follows up for migraines.    UPDATE: *** Intensity:  3-4/10 Duration:  usually 15-30 minutes with Nurtec (with or without Zofran  and tizanidine ) as first line and sumatriptan  [place] second line (has not needed) Frequency:  first 2 months after infusion has 3-4 mild headaches and 1-2 severe headaches; for third month prior to next infusion, 8 mild headaches and 2 severe migraines a month ***  Frequency of abortive medication: 6 days a month Current NSAIDS/analgesics:  ASA Current triptans:  Sumatriptan  6mg  [place] Current ergotamine:  none Current anti-emetic:  Zofran  4mg  Current muscle relaxants:  tizanidine  4mg  Current Antihypertensive medications:  metoprolol tartrate 25mg  BID Current Antidepressant medications:  citalopram  20mg  daily Current Anticonvulsant medications:  Depakote 125mg  TID (mood), gabapentin  800mg  three times daily Current anti-CGRP:  Vyepti  300mg  every 3 months, Nurtec PRN Current Vitamins/Herbal/Supplements:  magnesium  citrate 400mg  daily, riboflavin 400mg  daily, CoQ-10 100mg  TID (ran out but plans to buy more)  Current Antihistamines/Decongestants:  none Other therapy:  none Hormone/birth control:  none Other medications:  buspirone    Caffeine:  3 pods a day of decaf coffee.  No longer drinks cola Smoking:   quit Diet:  Drinks Bubbly but no water.  Sometimes Sprite.  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  Eats junk food. Eats meat/chicken with starch vegetables/potatoes Exercise:  no Depression:  yes; Anxiety:  yes Other pain:  Chronic back pain, lumbar radicular pain.  Sleep hygiene:  On CPAP for OSA.  ***    HISTORY:  Onset:  53 years old Location:  left sided (frontal/retro-orbital) Quality:  pounding Intensity:  9-10/10.   Aura:  sometimes headaches preceded by visual aura (fuzzy vision/floaters in left eye) and phantosmia (metallic smell) Prodrome:  absent Associated symptoms:  Nausea, vomiting, photophobia, phonophobia, dizziness (once every 2 months).  She denies associated unilateral numbness or weakness. Duration:  1-2 days, sometimes 4-5 days (years ago would last 1-2 weeks) Frequency:  4 days a week Frequency of abortive medication: 5 days a week switches between sumatriptan  and Excedrin Migraine Triggers/aggravating factors:  not eating, stress, worse when laying down Relieving factors:  nothing Activity:  aggravates   History of medication overuse leading to daily headaches in 2021. MRI of brain without contrast on 03/16/2020 revealed minimal nonspecific scattered hyperintense foci within the bilateral periventricular and subcortical white matter, stable compared to prior MRI from 02/06/2017.   History of chronic low back pain.  In 2021 reported worsening left lower back pain with radicular pain down the left leg causing left leg weakness.  Also endorsed bowel and bladder incontinence.  MRI of lumbar spine without contrast on 03/09/2020 personally reviewed showed no spinal or foraminal stenosis.  MRI of cervical spine on 03/09/2020 personally reviewed showed mild spondylosis and disc bulging from C3-4 to C6-7 with  mild left foraminal stenosis at C4-5 but otherwise no spinal canal stenosis or foraminal stenosis.  MRI of thoracic spine on 03/16/2020 personally  reviewed was unremarkable.         Past NSAIDS/analgesics:  ibuprofen , naproxen , BC powder, diclofenac  75mg , meloxicam , Excedrin Past abortive triptans:  rizatriptan  10mg , sumatriptan  20mg  NS/tab Past abortive ergotamine:  none Past muscle relaxants:  methocarbamol , cyclobenzaprine Past anti-emetic:  none Past antihypertensive medications:  propranolol  Past antidepressant medications:  amitriptyline, imipramine, sertraline , mirtazapine , Wellbutrin , vortioxetine, vilazodone Past anticonvulsant medications:  lamotrigine , carbamazepine Past anti-CGRP:  Aimovig , Ajovy , Emgality  Past vitamins/Herbal/Supplements:  none Past antihistamines/decongestants:  Flonase  Other past therapies:  Botox     Family history of headache:  daughters (migraines)  PAST MEDICAL HISTORY: Past Medical History:  Diagnosis Date   Allergy    Anxiety    Bipolar affective psychosis (HCC)    Complication of anesthesia     Acting silly- waving at everyone-Giddy   Depression    Emphysema lung (HCC)    inhaler prn,  followed by pcp and pulmonologist-- dr frances   Family history of adverse reaction to anesthesia    Father had rheumatic fever, and died on operating table.   GERD (gastroesophageal reflux disease)    Kidney cysts 01/24/2020   Right Kidney   Migraine    Moderate persistent asthma 09/05/2021   Sleep apnea    Smokers' cough (HCC)    per pt not productive   SUI (stress urinary incontinence, female)    Tubular adenoma of colon 07/08/2020   Wears glasses     MEDICATIONS: Current Outpatient Medications on File Prior to Visit  Medication Sig Dispense Refill   albuterol  (VENTOLIN  HFA) 108 (90 Base) MCG/ACT inhaler Inhale 2 puffs into the lungs every 4 (four) hours as needed for wheezing or shortness of breath. 1 each 1   budesonide -formoterol  (SYMBICORT ) 160-4.5 MCG/ACT inhaler Inhale 2 puffs into the lungs 2 (two) times daily. 10.2 g 7   busPIRone  (BUSPAR ) 15 MG tablet Take 1 tablet (15 mg  total) by mouth 3 (three) times daily. 30 tablet 0   citalopram  (CELEXA ) 40 MG tablet Take 1 tablet (40 mg total) by mouth daily. 30 tablet 0   DEPAKOTE 125 MG DR tablet Take 125 mg by mouth 3 (three) times daily.     EPINEPHrine  0.3 mg/0.3 mL IJ SOAJ injection Inject 0.3 mg into the muscle as needed for anaphylaxis. 1 each 0   esomeprazole  (NEXIUM ) 20 MG capsule Take 1 capsule (20 mg total) by mouth 2 (two) times daily before a meal. (Patient taking differently: Take 20 mg by mouth daily at 12 noon.) 60 capsule 1   gabapentin  (NEURONTIN ) 400 MG capsule Take 2 capsules (800 mg total) by mouth 3 (three) times daily.     metoprolol tartrate (LOPRESSOR) 25 MG tablet Take 25 mg by mouth 2 (two) times daily. (Patient taking differently: Take 25 mg by mouth daily.)     MYRBETRIQ  50 MG TB24 tablet Take 50 mg by mouth daily.     ondansetron  (ZOFRAN ) 4 MG tablet Take 1 tablet (4 mg total) by mouth every 8 (eight) hours as needed for nausea or vomiting. 20 tablet 6   ramelteon (ROZEREM) 8 MG tablet Take 8 mg by mouth at bedtime.     Rimegepant Sulfate (NURTEC) 75 MG TBDP Take 1 tablet (75 mg total) by mouth daily as needed. 8 tablet 11   SUMAtriptan  (IMITREX ) 6 MG/0.5ML SOLN injection Inject 0.5 mLs (6 mg total) into the  skin See admin instructions. INJECT 6 MG (0.5 ml) INTO THE SKIN AS NEEDED FOR MIGRAINE OR HEADACHE. MAY REPEAT ONCE IN 2 HOURS IF HEADACHE PERSISTS OR RECURS. MAX 2 DOSES IN 24 HOURS. (Patient not taking: Reported on 12/28/2023) 3 mL 5   thiothixene (NAVANE) 10 MG capsule Take 20 mg by mouth at bedtime.     tiZANidine  (ZANAFLEX ) 4 MG tablet Take 1 tablet (4 mg total) by mouth every 6 (six) hours as needed for muscle spasms. 30 tablet 5   No current facility-administered medications on file prior to visit.    ALLERGIES: Allergies  Allergen Reactions   Codeine Anaphylaxis and Other (See Comments)    Cannot have ANYTHING with codeine    FAMILY HISTORY: Family History  Problem Relation  Age of Onset   Depression Mother    Cancer Mother        Cervical   Alcohol abuse Father    Crohn's disease Sister    Stroke Brother 87   Migraines Daughter    GER disease Daughter    Depression Daughter    Bipolar disorder Daughter    Migraines Daughter    GER disease Daughter    Colon cancer Neg Hx    Rectal cancer Neg Hx    Stomach cancer Neg Hx       Objective:  *** General: No acute distress.  Patient appears well-groomed.   Head:  Normocephalic/atraumatic Neck:  Supple.  No paraspinal tenderness.  Full range of motion. Heart:  Regular rate and rhythm. Neuro:  Alert and oriented.  Speech fluent and not dysarthric.  Language intact.  CN II-XII intact.  Bulk and tone normal.  Muscle strength 5/5 throughout.  Sensation to light touch intact.  Deep tendon reflexes 2+ throughout, toes downgoing.  Gait normal.  Romberg negative.   Juliene Dunnings, DO  CC: Reyes [place], MD       "

## 2024-01-14 ENCOUNTER — Encounter: Payer: Self-pay | Admitting: Family Medicine

## 2024-01-14 ENCOUNTER — Encounter: Payer: Self-pay | Admitting: Neurology

## 2024-01-14 ENCOUNTER — Ambulatory Visit: Admitting: Neurology

## 2024-01-14 VITALS — BP 116/77 | HR 65 | Ht 63.0 in | Wt 172.8 lb

## 2024-01-14 DIAGNOSIS — G43109 Migraine with aura, not intractable, without status migrainosus: Secondary | ICD-10-CM | POA: Diagnosis not present

## 2024-01-14 MED ORDER — PREDNISONE 10 MG PO TABS: ORAL_TABLET | ORAL | 0 refills | Status: AC

## 2024-01-14 MED ORDER — NURTEC 75 MG PO TBDP: 1.0000 | ORAL_TABLET | Freq: Every day | ORAL | 11 refills | Status: AC | PRN

## 2024-01-14 NOTE — Patient Instructions (Signed)
 Start prednisone  taper. If migraines still daily by next week, consider talking with your psychiatrist about increasing Depakote.  If they are not comfortable with that, let me know. Nurtec once daily as needed Follow up 6 months.

## 2024-01-14 NOTE — Progress Notes (Signed)
 Lauren Chapman

## 2024-01-16 ENCOUNTER — Ambulatory Visit

## 2024-01-16 VITALS — Ht 63.0 in | Wt 172.0 lb

## 2024-01-16 DIAGNOSIS — Z0000 Encounter for general adult medical examination without abnormal findings: Secondary | ICD-10-CM

## 2024-01-16 NOTE — Progress Notes (Addendum)
 "  Chief Complaint  Patient presents with   Medicare Wellness     Subjective:   Lauren Chapman is a 53 y.o. female who presents for a Medicare Annual Wellness Visit.  Visit info / Clinical Intake: Medicare Wellness Visit Type:: Subsequent Annual Wellness Visit Persons participating in visit and providing information:: patient Medicare Wellness Visit Mode:: Video Since this visit was completed virtually, some vitals may be partially provided or unavailable. Missing vitals are due to the limitations of the virtual format.: Unable to obtain vitals - no equipment If Telephone or Video please confirm:: I connected with patient using audio/video enable telemedicine. I verified patient identity with two identifiers, discussed telehealth limitations, and patient agreed to proceed. Patient Location:: Home Provider Location:: Home Interpreter Needed?: No Pre-visit prep was completed: yes AWV questionnaire completed by patient prior to visit?: no Living arrangements:: with family/others Patient's Overall Health Status Rating: (!) fair Typical amount of pain: none Does pain affect daily life?: no Are you currently prescribed opioids?: no  Dietary Habits and Nutritional Risks How many meals a day?: (!) 1 Eats fruit and vegetables daily?: (!) no Most meals are obtained by: having others provide food In the last 2 weeks, have you had any of the following?: (!) nausea, vomiting, diarrhea (diarrhea) Diabetic:: no  Functional Status Activities of Daily Living (to include ambulation/medication): Independent Ambulation: Independent with device- listed below Home Assistive Devices/Equipment: Eyeglasses Medication Administration: Independent Home Management (perform basic housework or laundry): Independent Manage your own finances?: yes Primary transportation is: driving Concerns about vision?: no *vision screening is required for WTM* Concerns about hearing?: (!) yes Uses hearing aids?: (!)  yes Hear whispered voice?: (!) no *in-person visit only*  Fall Screening Falls in the past year?: 0 Number of falls in past year: 0 Was there an injury with Fall?: 0 Fall Risk Category Calculator: 0 Patient Fall Risk Level: Low Fall Risk  Fall Risk Patient at Risk for Falls Due to: No Fall Risks Fall risk Follow up: Falls evaluation completed; Falls prevention discussed  Home and Transportation Safety: All rugs have non-skid backing?: (!) no All stairs or steps have railings?: yes Grab bars in the bathtub or shower?: (!) no Have non-skid surface in bathtub or shower?: yes Good home lighting?: yes Regular seat belt use?: yes Hospital stays in the last year:: no  Cognitive Assessment Difficulty concentrating, remembering, or making decisions? : yes Will 6CIT or Mini Cog be Completed: yes What year is it?: 0 points What month is it?: 0 points Give patient an address phrase to remember (5 components): [address], [place], [place] About what time is it?: 0 points Count backwards from 20 to 1: 0 points Say the months of the year in reverse: 2 points (Novemember) Repeat the address phrase from earlier: 2 points (north) 6 CIT Score: 4 points  Advance Directives (For Healthcare) Does Patient Have a Medical Advance Directive?: No Would patient like information on creating a medical advance directive?: No - Patient declined  Reviewed/Updated  Reviewed/Updated: Reviewed All (Medical, Surgical, Family, Medications, Allergies, Care Teams, Patient Goals)    Allergies (verified) Codeine   Current Medications (verified) Outpatient Encounter Medications as of 01/16/2024  Medication Sig   albuterol  (VENTOLIN  HFA) 108 (90 Base) MCG/ACT inhaler Inhale 2 puffs into the lungs every 4 (four) hours as needed for wheezing or shortness of breath.   budesonide -formoterol  (SYMBICORT ) 160-4.5 MCG/ACT inhaler Inhale 2 puffs into the lungs 2 (two) times daily.   busPIRone  (BUSPAR ) 15  MG tablet Take  1 tablet (15 mg total) by mouth 3 (three) times daily. (Patient taking differently: Take 15 mg by mouth in the morning, at noon, in the evening, and at bedtime.)   citalopram  (CELEXA ) 40 MG tablet Take 1 tablet (40 mg total) by mouth daily.   DEPAKOTE 125 MG DR tablet Take 125 mg by mouth 3 (three) times daily. (Patient taking differently: Take 125 mg by mouth 3 (three) times daily. 1tab in the morning, 1 tab in afternoon, Two tabs at bedtime)   EPINEPHrine  0.3 mg/0.3 mL IJ SOAJ injection Inject 0.3 mg into the muscle as needed for anaphylaxis.   esomeprazole  (NEXIUM ) 20 MG capsule Take 1 capsule (20 mg total) by mouth 2 (two) times daily before a meal. (Patient taking differently: Take 20 mg by mouth daily at 12 noon.)   gabapentin  (NEURONTIN ) 400 MG capsule Take 2 capsules (800 mg total) by mouth 3 (three) times daily.   metoprolol tartrate (LOPRESSOR) 25 MG tablet Take 25 mg by mouth 2 (two) times daily. (Patient taking differently: Take 25 mg by mouth daily.)   MYRBETRIQ  50 MG TB24 tablet Take 50 mg by mouth daily.   ondansetron  (ZOFRAN ) 4 MG tablet Take 1 tablet (4 mg total) by mouth every 8 (eight) hours as needed for nausea or vomiting.   predniSONE  (DELTASONE ) 10 MG tablet Take 60mg  on day 1, then 50mg  on day 2, then 40mg  on day 3, then 30mg  on day 4, then 20mg  on day 5, then 10mg  on day 6   ramelteon (ROZEREM) 8 MG tablet Take 8 mg by mouth at bedtime.   Rimegepant Sulfate (NURTEC) 75 MG TBDP Take 1 tablet (75 mg total) by mouth daily as needed.   thiothixene (NAVANE) 10 MG capsule Take 20 mg by mouth at bedtime.   tiZANidine  (ZANAFLEX ) 4 MG tablet Take 1 tablet (4 mg total) by mouth every 6 (six) hours as needed for muscle spasms.   No facility-administered encounter medications on file as of 01/16/2024.    History: Past Medical History:  Diagnosis Date   Allergy    Anxiety    Bipolar affective psychosis (HCC)    Complication of anesthesia     Acting silly- waving at  everyone-Giddy   Depression    Emphysema lung (HCC)    inhaler prn,  followed by pcp and pulmonologist-- dr frances   Family history of adverse reaction to anesthesia    Father had rheumatic fever, and died on operating table.   GERD (gastroesophageal reflux disease)    Kidney cysts 01/24/2020   Right Kidney   Migraine    Moderate persistent asthma 09/05/2021   Sleep apnea    Smokers' cough (HCC)    per pt not productive   SUI (stress urinary incontinence, female)    Tubular adenoma of colon 07/08/2020   Wears glasses    Past Surgical History:  Procedure Laterality Date   ABDOMINAL HYSTERECTOMY N/A    Phreesia 11/30/2019   bladder stimulator     CARPAL TUNNEL RELEASE Right 09-12-2007   @[facility]    and GANGLION CYST EXCISION   COLONOSCOPY     CYSTOSCOPY N/A 04/29/2019   Procedure: CYSTOSCOPY;  Surgeon: Arnaldo Purchase, MD;  Location: [facility] ;  Service: Gynecology;  Laterality: N/A;   DILATATION & CURETTAGE/HYSTEROSCOPY WITH MYOSURE N/A 11/13/2018   Procedure: DILATATION & CURETTAGE/HYSTEROSCOPY WITH MYOSURE;  Surgeon: Rockney Evalene SQUIBB, MD;  Location: [facility];  Service: Gynecology;  Laterality: N/A;  request  9:00am OR start time in [place] [facility] block requests one hour   DILATION AND CURETTAGE OF UTERUS  02/2019   EYE SURGERY N/A    Phreesia 11/30/2019   GANGLION CYST EXCISION     R wrist   GREAT TOE ARTHRODESIS, JONES PROCEDURE     hammer toe 2024   interstem      implant for overactive bladder   KNEE ARTHROSCOPY WITH ANTERIOR CRUCIATE LIGAMENT (ACL) REPAIR Right 10/2015   LAPAROSCOPIC CHOLECYSTECTOMY  1997   LAPAROSCOPIC HYSTERECTOMY Bilateral 04/29/2019   Procedure: HYSTERECTOMY TOTAL LAPAROSCOPIC, BILATERAL SALPINO-OOPHORECTOMY;  Surgeon: Arnaldo Purchase, MD;  Location: [facility];  Service: Gynecology;  Laterality: Bilateral;   LAPAROSCOPIC TOTAL HYSTERECTOMY     TOOTH EXTRACTION  04/01/2019   tooth removal  Bilateral 2022   4 teeth removed   UPPER GASTROINTESTINAL ENDOSCOPY     Family History  Problem Relation Age of Onset   Depression Mother    Cancer Mother        Cervical   Alcohol abuse Father    Crohn's disease Sister    Stroke Brother 30   Migraines Daughter    GER disease Daughter    Depression Daughter    Bipolar disorder Daughter    Migraines Daughter    GER disease Daughter    Colon cancer Neg Hx    Rectal cancer Neg Hx    Stomach cancer Neg Hx    Social History   Occupational History   Occupation: Disabled    Comment: Formerly a runner, broadcasting/film/video.   Occupation: Part time work  Tobacco Use   Smoking status: Former    Current packs/day: 0.00    Average packs/day: 1 pack/day for 39.8 years (39.8 ttl pk-yrs)    Types: Cigarettes    Start date: 10/10/1981    Quit date: 07/18/2021    Years since quitting: 2.4    Passive exposure: Never   Smokeless tobacco: Never  Vaping Use   Vaping status: Never Used  Substance and Sexual Activity   Alcohol use: Never    Alcohol/week: 0.0 standard drinks of alcohol   Drug use: Never   Sexual activity: Not Currently    Partners: Male    Birth control/protection: Post-menopausal    Comment: menarche 53yo, 1st intercourse 53 yo-More than 5 partners   Tobacco Counseling Counseling given: Not Answered  SDOH Screenings   Food Insecurity: Food Insecurity Present (01/16/2024)  Housing: Unknown (01/16/2024)  Transportation Needs: No Transportation Needs (01/16/2024)  Utilities: Not At Risk (01/16/2024)  Alcohol Screen: Low Risk (11/02/2021)  Depression (PHQ2-9): High Risk (01/16/2024)  Financial Resource Strain: Low Risk (08/29/2023)  Physical Activity: Inactive (01/16/2024)  Social Connections: Socially Isolated (01/16/2024)  Stress: Stress Concern Present (01/16/2024)  Tobacco Use: Medium Risk (01/16/2024)  Health Literacy: Adequate Health Literacy (01/16/2024)   See flowsheets for full screening details  Depression Screen PHQ 2 &  9 Depression Scale- Over the past 2 weeks, how often have you been bothered by any of the following problems? Little interest or pleasure in doing things: 2 Feeling down, depressed, or hopeless (PHQ Adolescent also includes...irritable): 2 PHQ-2 Total Score: 4 Trouble falling or staying asleep, or sleeping too much: 3 (staying asleep) Feeling tired or having little energy: 0 Poor appetite or overeating (PHQ Adolescent also includes...weight loss): 3 Feeling bad about yourself - or that you are a failure or have let yourself or your family down: 3 Trouble concentrating on things, such as reading the newspaper or watching television (PHQ  Adolescent also includes...like school work): 0 Moving or speaking so slowly that other people could have noticed. Or the opposite - being so fidgety or restless that you have been moving around a lot more than usual: 0 Thoughts that you would be better off dead, or of hurting yourself in some way: 0 PHQ-9 Total Score: 13 If you checked off any problems, how difficult have these problems made it for you to do your work, take care of things at home, or get along with other people?: Somewhat difficult  Depression Treatment Depression Interventions/Treatment : Counseling; Currently on Treatment     Goals Addressed             This Visit's Progress    Weight (lb) < 200 lb (90.7 kg)   172 lb (78 kg)    Would like to lose a few more lbs/2025             Objective:    Today's Vitals   01/16/24 0927  Weight: 172 lb (78 kg)  Height: 5' 3 (1.6 m)   Body mass index is 30.47 kg/m.  Hearing/Vision screen Hearing Screening - Comments:: Had hearing test-will have to wear hearing aides Vision Screening - Comments:: Wears eyeglasses/UTD/Dr. Octavia Immunizations and Health Maintenance Health Maintenance  Topic Date Due   Hepatitis B Vaccines 19-59 Average Risk (1 of 3 - 19+ 3-dose series) Never done   Influenza Vaccine  08/24/2023   COVID-19 Vaccine  (8 - 2025-26 season) 09/24/2023   Mammogram  03/18/2024   Lung Cancer Screening  09/09/2024   Medicare Annual Wellness (AWV)  01/15/2025   DTaP/Tdap/Td (2 - Td or Tdap) 10/15/2028   Colonoscopy  06/26/2033   Pneumococcal Vaccine: 50+ Years  Completed   Hepatitis C Screening  Completed   HIV Screening  Completed   Zoster Vaccines- Shingrix  Completed   HPV VACCINES  Aged Out   Meningococcal B Vaccine  Aged Out   Fecal DNA (Cologuard)  Discontinued        Assessment/Plan:  This is a routine wellness examination for Lauren Chapman.  Patient Care Team: Levora Reyes SAUNDERS, MD as PCP - General (Family Medicine) Tobie Burgess [place], MD as Consulting Physician (Physical Medicine and Rehabilitation) Chrzanowski, Shasta NOVAK, NP as Nurse Practitioner (Radiology) Skeet Juliene SAUNDERS, DO as Consulting Physician (Neurology) [facility]  I have personally reviewed and noted the following in the patients chart:   Medical and social history Use of alcohol, tobacco or illicit drugs  Current medications and supplements including opioid prescriptions. Functional ability and status Nutritional status Physical activity Advanced directives List of other physicians Hospitalizations, surgeries, and ER visits in previous 12 months Vitals Screenings to include cognitive, depression, and falls Referrals and appointments  No orders of the defined types were placed in this encounter.  In addition, I have reviewed and discussed with patient certain preventive protocols, quality metrics, and best practice recommendations. A written personalized care plan for preventive services as well as general preventive health recommendations were provided to patient.   Lauren Chapman, CMA   01/16/2024   Return in 1 year (on 01/15/2025).  After Visit Summary: (MyChart) Due to this being a telephonic visit, the after visit summary with patients personalized plan was offered to patient via MyChart   Nurse  Notes: Patient is due for a Hepatitis B vaccine and would like to get that during her next office visit.  Patient had no concerns to address today. "

## 2024-01-16 NOTE — Patient Instructions (Addendum)
 Lauren Chapman,  Thank you for taking the time for your Medicare Wellness Visit. I appreciate your continued commitment to your health goals. Please review the care plan we discussed, and feel free to reach out if I can assist you further.  Please note that Annual Wellness Visits do not include a physical exam. Some assessments may be limited, especially if the visit was conducted virtually. If needed, we may recommend an in-person follow-up with your provider.  Ongoing Care Seeing your primary care provider every 3 to 6 months helps us  monitor your health and provide consistent, personalized care. Next office visit on 03/05/2024.  You are due for a Hep B vaccine and can get that done during your next office visit.  Aim for 30 minutes of exercise or brisk walking, 6-8 glasses of water, and 5 servings of fruits and vegetables each day.   Referrals If a referral was made during today's visit and you haven't received any updates within two weeks, please contact the referred provider directly to check on the status.  Recommended Screenings:  Health Maintenance  Topic Date Due   Hepatitis B Vaccine (1 of 3 - 19+ 3-dose series) Never done   Flu Shot  08/24/2023   COVID-19 Vaccine (8 - 2025-26 season) 09/24/2023   Medicare Annual Wellness Visit  01/09/2024   Breast Cancer Screening  03/18/2024   Screening for Lung Cancer  09/09/2024   DTaP/Tdap/Td vaccine (2 - Td or Tdap) 10/15/2028   Colon Cancer Screening  06/26/2033   Pneumococcal Vaccine for age over 21  Completed   Hepatitis C Screening  Completed   HIV Screening  Completed   Zoster (Shingles) Vaccine  Completed   HPV Vaccine  Aged Out   Meningitis B Vaccine  Aged Out   Cologuard (Stool DNA test)  Discontinued       01/16/2024    9:34 AM  Advanced Directives  Does Patient Have a Medical Advance Directive? No    Vision: Annual vision screenings are recommended for early detection of glaucoma, cataracts, and diabetic retinopathy. These  exams can also reveal signs of chronic conditions such as diabetes and high blood pressure.  Dental: Annual dental screenings help detect early signs of oral cancer, gum disease, and other conditions linked to overall health, including heart disease and diabetes.  Please see the attached documents for additional preventive care recommendations.

## 2024-01-22 ENCOUNTER — Encounter: Payer: Self-pay | Admitting: Neurology

## 2024-02-04 ENCOUNTER — Encounter: Payer: Self-pay | Admitting: Neurology

## 2024-02-04 ENCOUNTER — Telehealth: Admitting: Family Medicine

## 2024-02-04 DIAGNOSIS — M533 Sacrococcygeal disorders, not elsewhere classified: Secondary | ICD-10-CM

## 2024-02-04 DIAGNOSIS — W19XXXA Unspecified fall, initial encounter: Secondary | ICD-10-CM | POA: Diagnosis not present

## 2024-02-04 DIAGNOSIS — Y92009 Unspecified place in unspecified non-institutional (private) residence as the place of occurrence of the external cause: Secondary | ICD-10-CM

## 2024-02-04 MED ORDER — TRAMADOL HCL 50 MG PO TABS: 50.0000 mg | ORAL_TABLET | Freq: Three times a day (TID) | ORAL | 0 refills | Status: AC | PRN

## 2024-02-04 NOTE — Progress Notes (Signed)
 Virtual Visit via Video Note  I connected with Lauren Chapman on 02/04/2024 at 4:49 PM by a video enabled telemedicine application and verified that I am speaking with the correct person using two identifiers.  Patient location:home - by self.  My location: office - [place].    I discussed the limitations, risks, security and privacy concerns of performing an evaluation and management service by telephone and the availability of in person appointments. I also discussed with the patient that there may be a patient responsible charge related to this service. The patient expressed understanding and agreed to proceed, consent obtained  Chief complaint:  Chief Complaint  Patient presents with   Fall    I fell down the stairs Friday and hurt my tail bone. Pain is getting worse. Able to walk but it hurts to walk, sit, and lay down. Shooting pain    History of Present Illness: Lauren Chapman is a 54 y.o. female  Fall, tailbone pain.  Occurred at home 3 days ago, running down carpeted stairs, slipped and landed on tailbone, then slid down stairs. Few bruises on arms, but pain-free movement of extremities, neck and back except tailbone. No other areas of pain. Did not hit head. No HA/N/V. Able to weight bear, but hurts into tailbone.   Some initial tailbone pain, but more sore past few days. Pain around tailbone only. No leg radiation.   Tx: tylenol , excedrin minimal relief. Pain has kept her up at night.   Has taken tramadol  in past.  Controlled substance database (PDMP) reviewed. No concerns appreciated.  No Madame #15 last filled on 05/28/2023.  Consistent refills for gabapentin .   Patient Active Problem List   Diagnosis Date Noted   Former smoker 09/27/2023   Primary osteoarthritis of right hip 09/14/2023   Morbid obesity (HCC) 05/22/2023   Schizoaffective disorder (HCC) 03/01/2023   Asthma 02/15/2023   Intractable chronic migraine with aura with status migrainosus  02/15/2023   Overactive bladder 02/15/2023   Poorly controlled mild persistent asthma 09/05/2021   Syncope 08/02/2021   CAP (community acquired pneumonia) 07/18/2021   OSA (obstructive sleep apnea) 07/18/2021   Hypokalemia 07/18/2021   Depression 03/24/2020   Left lumbar radiculopathy 02/25/2020   Absence of bladder continence 02/25/2020   History of postmenopausal bleeding 04/29/2019   Polypharmacy 04/03/2019   Acute low back pain 07/31/2018   Emphysema, unspecified (HCC) 05/17/2018   Weakness 01/25/2017   Chronic migraine w/o aura w/o status migrainosus, not intractable 12/22/2014   Encounter for other general counseling or advice on contraception 09/02/2014   Bipolar disorder (HCC) 10/12/2011   Anxiety 10/12/2011   GERD (gastroesophageal reflux disease) 10/12/2011   Past Medical History:  Diagnosis Date   Allergy    Anxiety    Bipolar affective psychosis (HCC)    Complication of anesthesia     Acting silly- waving at everyone-Giddy   Depression    Emphysema lung (HCC)    inhaler prn,  followed by pcp and pulmonologist-- dr frances   Family history of adverse reaction to anesthesia    Father had rheumatic fever, and died on operating table.   GERD (gastroesophageal reflux disease)    Kidney cysts 01/24/2020   Right Kidney   Migraine    Moderate persistent asthma 09/05/2021   Sleep apnea    Smokers' cough (HCC)    per pt not productive   SUI (stress urinary incontinence, female)    Tubular adenoma of colon 07/08/2020   Wears glasses  Past Surgical History:  Procedure Laterality Date   ABDOMINAL HYSTERECTOMY N/A    Phreesia 11/30/2019   bladder stimulator     CARPAL TUNNEL RELEASE Right 09-12-2007   @[facility]    and GANGLION CYST EXCISION   COLONOSCOPY     CYSTOSCOPY N/A 04/29/2019   Procedure: CYSTOSCOPY;  Surgeon: Arnaldo Purchase, MD;  Location: [facility];  Service: Gynecology;  Laterality: N/A;   DILATATION & CURETTAGE/HYSTEROSCOPY WITH  MYOSURE N/A 11/13/2018   Procedure: DILATATION & CURETTAGE/HYSTEROSCOPY WITH MYOSURE;  Surgeon: Rockney Evalene SQUIBB, MD;  Location: [facility];  Service: Gynecology;  Laterality: N/A;  request 9:00am OR start time in [place] [facility] block requests one hour   DILATION AND CURETTAGE OF UTERUS  02/2019   EYE SURGERY N/A    Phreesia 11/30/2019   GANGLION CYST EXCISION     R wrist   GREAT TOE ARTHRODESIS, JONES PROCEDURE     hammer toe 2024   interstem      implant for overactive bladder   KNEE ARTHROSCOPY WITH ANTERIOR CRUCIATE LIGAMENT (ACL) REPAIR Right 10/2015   LAPAROSCOPIC CHOLECYSTECTOMY  1997   LAPAROSCOPIC HYSTERECTOMY Bilateral 04/29/2019   Procedure: HYSTERECTOMY TOTAL LAPAROSCOPIC, BILATERAL SALPINO-OOPHORECTOMY;  Surgeon: Arnaldo Purchase, MD;  Location[facility];  Service: Gynecology;  Laterality: Bilateral;   LAPAROSCOPIC TOTAL HYSTERECTOMY     TOOTH EXTRACTION  04/01/2019   tooth removal Bilateral 2022   4 teeth removed   UPPER GASTROINTESTINAL ENDOSCOPY     Allergies[1] Prior to Admission medications  Medication Sig Start Date End Date Taking? Authorizing Provider  albuterol  (VENTOLIN  HFA) 108 (90 Base) MCG/ACT inhaler Inhale 2 puffs into the lungs every 4 (four) hours as needed for wheezing or shortness of breath. 07/18/22  Yes Cobb, Comer GAILS, NP  budesonide -formoterol  (SYMBICORT ) 160-4.5 MCG/ACT inhaler Inhale 2 puffs into the lungs 2 (two) times daily. 09/27/23  Yes Cobb, Comer GAILS, NP  busPIRone  (BUSPAR ) 15 MG tablet Take 1 tablet (15 mg total) by mouth 3 (three) times daily. Patient taking differently: Take 15 mg by mouth in the morning, at noon, in the evening, and at bedtime. 03/09/23  Yes Ezzard Staci SAILOR, NP  citalopram  (CELEXA ) 40 MG tablet Take 1 tablet (40 mg total) by mouth daily. 04/16/23 02/04/24 Yes Lynnette Barter, MD  DEPAKOTE 125 MG DR tablet Take 125 mg by mouth 3 (three) times daily. Patient taking differently: Take 125 mg by  mouth 3 (three) times daily. 2 tab in the morning, 2 tab in afternoon, 2 tabs at bedtime 06/13/23  Yes [provider]  EPINEPHrine  0.3 mg/0.3 mL IJ SOAJ injection Inject 0.3 mg into the muscle as needed for anaphylaxis. 03/29/21  Yes Ruthell Lonni FALCON, PA-C  esomeprazole  (NEXIUM ) 20 MG capsule Take 1 capsule (20 mg total) by mouth 2 (two) times daily before a meal. Patient taking differently: Take 20 mg by mouth daily at 12 noon. 03/21/19  Yes Melonie Colonel, Mikel HERO, MD  gabapentin  (NEURONTIN ) 400 MG capsule Take 2 capsules (800 mg total) by mouth 3 (three) times daily. 04/02/23  Yes Lynnette Barter, MD  metoprolol tartrate (LOPRESSOR) 25 MG tablet Take 25 mg by mouth 2 (two) times daily. Patient taking differently: Take 25 mg by mouth daily.   Yes [provider]  MYRBETRIQ  50 MG TB24 tablet Take 50 mg by mouth daily. 11/11/21  Yes [provider]  ondansetron  (ZOFRAN ) 4 MG tablet Take 1 tablet (4 mg total) by mouth every 8 (eight) hours as needed  for nausea or vomiting. 01/05/20  Yes Gayland Lauraine PARAS, NP  Rimegepant Sulfate (NURTEC) 75 MG TBDP Take 1 tablet (75 mg total) by mouth daily as needed. 01/14/24  Yes Skeet Juliene SAUNDERS, DO  thiothixene (NAVANE) 10 MG capsule Take 20 mg by mouth at bedtime. 04/06/23  Yes [provider]  tiZANidine  (ZANAFLEX ) 4 MG tablet Take 1 tablet (4 mg total) by mouth every 6 (six) hours as needed for muscle spasms. 04/19/23  Yes Jerrell Cleatus Ned, MD  predniSONE  (DELTASONE ) 10 MG tablet Take 60mg  on day 1, then 50mg  on day 2, then 40mg  on day 3, then 30mg  on day 4, then 20mg  on day 5, then 10mg  on day 6 Patient not taking: Reported on 02/04/2024 01/14/24   Skeet Juliene SAUNDERS, DO  ramelteon (ROZEREM) 8 MG tablet Take 8 mg by mouth at bedtime. Patient not taking: Reported on 02/04/2024 08/14/23   [provider]   Social History   Socioeconomic History   Marital status: Divorced    Spouse name: n/a   Number of children: 2   Years of  education: 18   Highest education level: Master's degree (e.g., MA, MS, MEng, MEd, MSW, MBA)  Occupational History   Occupation: Disabled    Comment: Formerly a runner, broadcasting/film/video.   Occupation: Part time work  Tobacco Use   Smoking status: Former    Current packs/day: 0.00    Average packs/day: 1 pack/day for 39.8 years (39.8 ttl pk-yrs)    Types: Cigarettes    Start date: 10/10/1981    Quit date: 07/18/2021    Years since quitting: 2.5    Passive exposure: Never   Smokeless tobacco: Never  Vaping Use   Vaping status: Never Used  Substance and Sexual Activity   Alcohol use: Never    Alcohol/week: 0.0 standard drinks of alcohol   Drug use: Never   Sexual activity: Not Currently    Partners: Male    Birth control/protection: Post-menopausal    Comment: menarche 54yo, 1st intercourse 54 yo-More than 5 partners  Other Topics Concern   Not on file  Social History Narrative   Lives at home with 1 of her two daughters and grandchildren/2025   Right-handed.   2-4 cups caffeine daily.   Disabled    One story home   Social Drivers of Health   Tobacco Use: Medium Risk (02/04/2024)   Patient History    Smoking Tobacco Use: Former    Smokeless Tobacco Use: Never    Passive Exposure: Never  Physicist, Medical Strain: Medium Risk (02/04/2024)   Overall Financial Resource Strain (CARDIA)    Difficulty of Paying Living Expenses: Somewhat hard  Food Insecurity: Food Insecurity Present (02/04/2024)   Epic    Worried About Programme Researcher, Broadcasting/film/video in the Last Year: Sometimes true    Ran Out of Food in the Last Year: Sometimes true  Transportation Needs: No Transportation Needs (02/04/2024)   Epic    Lack of Transportation (Medical): No    Lack of Transportation (Non-Medical): No  Physical Activity: Inactive (02/04/2024)   Exercise Vital Sign    Days of Exercise per Week: 0 days    Minutes of Exercise per Session: Not on file  Stress: Stress Concern Present (02/04/2024)   [employer] of  Occupational Health - Occupational Stress Questionnaire    Feeling of Stress: Rather much  Social Connections: Socially Isolated (02/04/2024)   Social Connection and Isolation Panel    Frequency of Communication with Friends and Family: More  than three times a week    Frequency of Social Gatherings with Friends and Family: Once a week    Attends Religious Services: Never    Database Administrator or Organizations: No    Attends Engineer, Structural: Not on file    Marital Status: Divorced  Intimate Partner Violence: Patient Unable To Answer (01/16/2024)   Epic    Fear of Current or Ex-Partner: Patient unable to answer    Emotionally Abused: Patient unable to answer    Physically Abused: Patient unable to answer    Sexually Abused: Patient unable to answer  Depression (PHQ2-9): High Risk (01/16/2024)   Depression (PHQ2-9)    PHQ-2 Score: 13  Alcohol Screen: Low Risk (11/02/2021)   Alcohol Screen    Last Alcohol Screening Score (AUDIT): 0  Housing: High Risk (02/04/2024)   Epic    Unable to Pay for Housing in the Last Year: Yes    Number of Times Moved in the Last Year: 0    Homeless in the Last Year: No  Utilities: Not At Risk (01/16/2024)   Epic    Threatened with loss of utilities: No  Health Literacy: Adequate Health Literacy (01/16/2024)   B1300 Health Literacy    Frequency of need for help with medical instructions: Never    Observations/Objective: There were no vitals filed for this visit. Nontoxic appearance on video.  Speaking full sentences, no respiratory distress, appropriate responses, euthymic mood.  All questions answered with understanding of plan expressed.  Assessment and Plan: Acute coccygeal pain - Plan: traMADol  (ULTRAM ) 50 MG tablet, DG Sacrum/Coccyx, DG Pelvis 1-2 Views  Fall in home, initial encounter - Plan: traMADol  (ULTRAM ) 50 MG tablet, DG Sacrum/Coccyx, DG Pelvis 1-2 Views Accidental fall at home onto carpeted stairs few days ago as above.   Few other bruises but no limitation of motion, no pain with motion of extremities, denies head injury, neck pain or back pain other than tailbone pain which has been progressive past few days.  No radiation into her legs.  Suspect coccygeal fracture versus contusion.  Will check imaging of coccyx as well as pelvis as I am unable to clear other areas of pelvis on exam given this is a video visit.  Symptomatic care discussed, insufficient treatment with over-the-counter treatments as above, short course of tramadol  provided.  Potential side effects discussed.  Recommended use of a inflatable doughnut/pillow to take pressure off of tailbone for now.  Further plan depending on imaging results and symptom control next few days.  Letter provided for work for 2 days to allow recovery and imaging as above.  RTC precautions, ER precautions given.  Follow Up Instructions: Depending on imaging and symptoms above.   I discussed the assessment and treatment plan with the patient. The patient was provided an opportunity to ask questions and all were answered. The patient agreed with the plan and demonstrated an understanding of the instructions.   The patient was advised to call back or seek an in-person evaluation if the symptoms worsen or if the condition fails to improve as anticipated.   Reyes JONELLE Pines, MD     [1]  Allergies Allergen Reactions   Codeine Anaphylaxis and Other (See Comments)    Cannot have ANYTHING with codeine

## 2024-02-04 NOTE — Patient Instructions (Signed)
 Thanks for taking the video call today.  I am sorry to hear about your fall a few days ago.  I suspect that you have either a bruised tailbone or possible broken tailbone.  Please have x-rays performed at the [facility] location in the next day or 2 if possible.  I did send in some tramadol  temporarily, and depending on x-rays and your symptoms in the next few days can decide on follow-up plans or potential need to meet with orthopedics.  Let me know if there are questions in the meantime.  I did send a note for work for the next few days if needed to help with symptom treatment and to allow time to get those x-rays performed.  Hang in there.  Tailbone Injury  The tailbone is the small bone at the lower end of the spine. The tailbone is also called the coccyx.A tailbone injury may involve stretched ligaments, bruising, or a broken bone/break (fracture). Tailbone injuries can be painful, and some may take a long time to heal. What are the causes? This condition may be caused by: Falling and landing on the tailbone. Repeated strain or friction from sitting for long periods of time. This may include actions such as rowing or bicycling. Childbirth. In some cases, the cause may not be known. What are the signs or symptoms? Symptoms of this condition include: Pain in the tailbone area or lower back, especially when sitting. Pain or difficulty when standing up from a sitting position. Bruising or swelling in the tailbone area. Painful bowel movements. In females, pain during sex. How is this diagnosed? This condition may be diagnosed based on your symptoms and a physical exam. If your health care provider suspects a fracture, you may have additional tests, such as: X-rays. CT scan. MRI. How is this treated? Most tailbone injuries heal on their own in 4-6 weeks. However, recovery time may be longer if there is a fracture. If treatment is needed, it may include: NSAIDs or other over-the-counter  medicines to help relieve your pain. Rubber or inflated ring or cushion to take pressure off the tailbone when sitting. Physical therapy. Injection with local anesthesia and steroid medicine. This is done only if the pain does not improve over time with over-the-counter pain medicines. Follow these instructions at home: Activity Rest as told by your health care provider. Do not sit for a long time without moving. Get up to take short walks every 1-2 hours. This will improve blood flow and breathing. Ask for help if you feel weak or unsteady. Wear appropriate padding and sports gear when bicycling or rowing. This will prevent repeating an injury that is caused by strain or friction. Do exercises as told by your health care provider. Increase your activity as the pain allows. Return to your normal activities as told by your health care provider. Ask your health care provider what activities are safe for you. Managing pain, stiffness, and swelling To help decrease discomfort when sitting: Sit on your rubber or inflated ring or cushion as told by your health care provider. Lean forward when you sit. If directed, put ice on the injured area. To do this: Put ice in a plastic bag. Place a towel between your skin and the bag. Leave the ice on for 20 minutes, 2-3 times per day for the first 1-2 days. If your skin turns bright red, remove the ice right away to prevent skin damage. The risk of skin damage is higher if you cannot feel pain,  heat, or cold. If directed, apply heat to the affected area as often as told by your health care provider. Use the heat source that your health care provider recommends, such as a moist heat pack or a heating pad. Place a towel between your skin and the heat source. Leave the heat on for 20-30 minutes. If your skin turns bright red, remove the heat right away to prevent burns. The risk of burns is higher if you cannot feel pain, heat, or cold. General  instructions Take over-the-counter and prescription medicines only as told by your health care provider. Your condition or medicine may cause constipation or painful bowel movements. To prevent or treat constipation, you may need to: Drink enough fluid to keep your urine pale yellow. Take over-the-counter or prescription medicines. Eat foods that are high in fiber, such as beans, whole grains, and fresh fruits and vegetables. Limit foods that are high in fat and processed sugars, such as fried or sweet foods. Contact a health care provider if: Your pain becomes worse or is not controlled with medicine. Your bowel movements cause a great deal of discomfort. You are unable to have a bowel movement after 4 days. You have pain during sex. Summary A tailbone injury may involve stretched ligaments, bruising, or a broken bone/break (fracture). Tailbone injuries can be painful. Most heal on their own in 4-6 weeks. Treatment may include taking NSAIDs, doing physical therapy, or using a rubber or inflated ring or cushion when sitting. Follow any recommendations from your health care provider to prevent or treat constipation. This information is not intended to replace advice given to you by your health care provider. Make sure you discuss any questions you have with your health care provider. Document Revised: 04/19/2021 Document Reviewed: 04/19/2021 Elsevier Patient Education  2024 [employer].

## 2024-02-05 ENCOUNTER — Ambulatory Visit: Payer: Self-pay | Admitting: Family Medicine

## 2024-02-05 ENCOUNTER — Ambulatory Visit (HOSPITAL_BASED_OUTPATIENT_CLINIC_OR_DEPARTMENT_OTHER)
Admission: RE | Admit: 2024-02-05 | Discharge: 2024-02-05 | Disposition: A | Discharge status: Home / Self Care | Source: Ambulatory Visit | Attending: Family Medicine | Admitting: Family Medicine

## 2024-02-05 ENCOUNTER — Telehealth: Payer: Self-pay

## 2024-02-05 DIAGNOSIS — W19XXXA Unspecified fall, initial encounter: Secondary | ICD-10-CM | POA: Diagnosis not present

## 2024-02-05 DIAGNOSIS — Y92009 Unspecified place in unspecified non-institutional (private) residence as the place of occurrence of the external cause: Secondary | ICD-10-CM | POA: Insufficient documentation

## 2024-02-05 DIAGNOSIS — M533 Sacrococcygeal disorders, not elsewhere classified: Secondary | ICD-10-CM | POA: Insufficient documentation

## 2024-02-05 NOTE — Telephone Encounter (Signed)
 Copied from CRM #[id number]. Topic: General - Other >> Feb 05, 2024 10:39 AM Deaijah H wrote: Reason for CRM: Patient called in to receive address for imaging

## 2024-02-05 NOTE — Telephone Encounter (Signed)
 Called patient and was unable to leave a vm. If patient calls back, I think she is referring to xray imaging? She can go to [facility] in the regular entrance and they will direct her where to go. Please let her know if she returns call

## 2024-02-06 NOTE — Telephone Encounter (Signed)
 Patient has imaging done yesterday

## 2024-02-07 ENCOUNTER — Telehealth: Payer: Self-pay

## 2024-02-07 NOTE — Telephone Encounter (Signed)
 Auth Submission: APPROVED - Renewal Site of care: Site of care: [facility] Payer: [employer] dual complete medicare Medication & CPT/J Code(s) submitted: Vyepti  (Eptinezumab ) [id number] Diagnosis Code:  Route of submission (phone, fax, portal): portal Phone # Fax # Auth type: Buy/Bill PB Units/visits requested: 300mg  x 4 doses Reference number: [id number] Approval from: 02/06/24 to 02/05/25

## 2024-02-14 ENCOUNTER — Ambulatory Visit: Admitting: Family Medicine

## 2024-02-29 ENCOUNTER — Encounter (HOSPITAL_COMMUNITY): Payer: Self-pay | Admitting: Family

## 2024-03-05 ENCOUNTER — Ambulatory Visit: Admitting: Family Medicine

## 2024-03-05 DIAGNOSIS — E7800 Pure hypercholesterolemia, unspecified: Secondary | ICD-10-CM

## 2024-03-05 DIAGNOSIS — I959 Hypotension, unspecified: Secondary | ICD-10-CM

## 2024-03-05 DIAGNOSIS — R7303 Prediabetes: Secondary | ICD-10-CM

## 2024-04-04 ENCOUNTER — Ambulatory Visit

## 2024-04-22 ENCOUNTER — Ambulatory Visit: Admitting: Family Medicine

## 2024-07-23 ENCOUNTER — Ambulatory Visit: Payer: Self-pay | Admitting: Neurology
